# Patient Record
Sex: Female | Born: 1945 | Race: White | Hispanic: No | Smoking: Current every day smoker
Health system: Southern US, Community
[De-identification: ages and names within clinical notes are randomized; demographics above are authoritative.]

## PROBLEM LIST (undated history)

## (undated) DIAGNOSIS — Z8679 Personal history of other diseases of the circulatory system: Secondary | ICD-10-CM

## (undated) DIAGNOSIS — I10 Essential (primary) hypertension: Secondary | ICD-10-CM

## (undated) DIAGNOSIS — I509 Heart failure, unspecified: Secondary | ICD-10-CM

## (undated) DIAGNOSIS — C801 Malignant (primary) neoplasm, unspecified: Secondary | ICD-10-CM

## (undated) DIAGNOSIS — I739 Peripheral vascular disease, unspecified: Secondary | ICD-10-CM

## (undated) DIAGNOSIS — Z22322 Carrier or suspected carrier of Methicillin resistant Staphylococcus aureus: Secondary | ICD-10-CM

## (undated) DIAGNOSIS — G8929 Other chronic pain: Secondary | ICD-10-CM

## (undated) DIAGNOSIS — I252 Old myocardial infarction: Secondary | ICD-10-CM

## (undated) DIAGNOSIS — E785 Hyperlipidemia, unspecified: Secondary | ICD-10-CM

## (undated) DIAGNOSIS — J189 Pneumonia, unspecified organism: Secondary | ICD-10-CM

## (undated) DIAGNOSIS — J45909 Unspecified asthma, uncomplicated: Secondary | ICD-10-CM

## (undated) DIAGNOSIS — I255 Ischemic cardiomyopathy: Secondary | ICD-10-CM

## (undated) DIAGNOSIS — M549 Dorsalgia, unspecified: Secondary | ICD-10-CM

## (undated) DIAGNOSIS — I2510 Atherosclerotic heart disease of native coronary artery without angina pectoris: Secondary | ICD-10-CM

## (undated) DIAGNOSIS — J449 Chronic obstructive pulmonary disease, unspecified: Secondary | ICD-10-CM

## (undated) HISTORY — PX: APPENDECTOMY: SHX54

## (undated) HISTORY — PX: INSERT / REPLACE / REMOVE PACEMAKER: SUR710

## (undated) HISTORY — PX: CARDIAC DEFIBRILLATOR PLACEMENT: SHX171

## (undated) HISTORY — DX: Dorsalgia, unspecified: M54.9

## (undated) HISTORY — PX: OTHER SURGICAL HISTORY: SHX169

## (undated) HISTORY — DX: Ischemic cardiomyopathy: I25.5

## (undated) HISTORY — DX: Hyperlipidemia, unspecified: E78.5

## (undated) HISTORY — DX: Peripheral vascular disease, unspecified: I73.9

## (undated) HISTORY — PX: ABDOMINAL SURGERY: SHX537

## (undated) HISTORY — DX: Other chronic pain: G89.29

## (undated) HISTORY — DX: Personal history of other diseases of the circulatory system: Z86.79

---

## 2010-09-28 ENCOUNTER — Ambulatory Visit: Payer: Self-pay | Admitting: Internal Medicine

## 2010-10-19 ENCOUNTER — Inpatient Hospital Stay: Payer: Self-pay | Admitting: Internal Medicine

## 2010-10-19 DIAGNOSIS — R55 Syncope and collapse: Secondary | ICD-10-CM

## 2010-10-19 DIAGNOSIS — I509 Heart failure, unspecified: Secondary | ICD-10-CM

## 2010-10-19 DIAGNOSIS — R7989 Other specified abnormal findings of blood chemistry: Secondary | ICD-10-CM

## 2010-10-19 DIAGNOSIS — I517 Cardiomegaly: Secondary | ICD-10-CM

## 2010-10-20 DIAGNOSIS — I5020 Unspecified systolic (congestive) heart failure: Secondary | ICD-10-CM

## 2010-10-22 DIAGNOSIS — I5032 Chronic diastolic (congestive) heart failure: Secondary | ICD-10-CM

## 2010-10-29 ENCOUNTER — Ambulatory Visit: Payer: Self-pay | Admitting: Internal Medicine

## 2014-07-21 ENCOUNTER — Inpatient Hospital Stay (HOSPITAL_COMMUNITY)
Admission: AD | Admit: 2014-07-21 | Discharge: 2014-07-28 | DRG: 871 | Disposition: A | Discharge status: Home / Self Care | Payer: Medicare Other | Source: Other Acute Inpatient Hospital | Attending: Pulmonary Disease | Admitting: Pulmonary Disease

## 2014-07-21 ENCOUNTER — Inpatient Hospital Stay (HOSPITAL_COMMUNITY): Payer: Medicare Other

## 2014-07-21 ENCOUNTER — Encounter (HOSPITAL_COMMUNITY): Payer: Self-pay | Admitting: *Deleted

## 2014-07-21 ENCOUNTER — Emergency Department: Payer: Self-pay | Admitting: Emergency Medicine

## 2014-07-21 DIAGNOSIS — F17210 Nicotine dependence, cigarettes, uncomplicated: Secondary | ICD-10-CM | POA: Diagnosis present

## 2014-07-21 DIAGNOSIS — I5021 Acute systolic (congestive) heart failure: Secondary | ICD-10-CM | POA: Diagnosis present

## 2014-07-21 DIAGNOSIS — Z681 Body mass index (BMI) 19 or less, adult: Secondary | ICD-10-CM

## 2014-07-21 DIAGNOSIS — R6521 Severe sepsis with septic shock: Secondary | ICD-10-CM | POA: Diagnosis present

## 2014-07-21 DIAGNOSIS — L97929 Non-pressure chronic ulcer of unspecified part of left lower leg with unspecified severity: Secondary | ICD-10-CM | POA: Diagnosis present

## 2014-07-21 DIAGNOSIS — E43 Unspecified severe protein-calorie malnutrition: Secondary | ICD-10-CM | POA: Insufficient documentation

## 2014-07-21 DIAGNOSIS — R579 Shock, unspecified: Secondary | ICD-10-CM

## 2014-07-21 DIAGNOSIS — E876 Hypokalemia: Secondary | ICD-10-CM | POA: Diagnosis present

## 2014-07-21 DIAGNOSIS — J9621 Acute and chronic respiratory failure with hypoxia: Secondary | ICD-10-CM

## 2014-07-21 DIAGNOSIS — J449 Chronic obstructive pulmonary disease, unspecified: Secondary | ICD-10-CM

## 2014-07-21 DIAGNOSIS — N179 Acute kidney failure, unspecified: Secondary | ICD-10-CM | POA: Diagnosis present

## 2014-07-21 DIAGNOSIS — F17200 Nicotine dependence, unspecified, uncomplicated: Secondary | ICD-10-CM

## 2014-07-21 DIAGNOSIS — I2510 Atherosclerotic heart disease of native coronary artery without angina pectoris: Secondary | ICD-10-CM

## 2014-07-21 DIAGNOSIS — R0602 Shortness of breath: Secondary | ICD-10-CM

## 2014-07-21 DIAGNOSIS — A419 Sepsis, unspecified organism: Secondary | ICD-10-CM | POA: Insufficient documentation

## 2014-07-21 DIAGNOSIS — L97909 Non-pressure chronic ulcer of unspecified part of unspecified lower leg with unspecified severity: Secondary | ICD-10-CM

## 2014-07-21 DIAGNOSIS — I509 Heart failure, unspecified: Secondary | ICD-10-CM

## 2014-07-21 DIAGNOSIS — G9341 Metabolic encephalopathy: Secondary | ICD-10-CM | POA: Diagnosis present

## 2014-07-21 DIAGNOSIS — R627 Adult failure to thrive: Secondary | ICD-10-CM

## 2014-07-21 DIAGNOSIS — Z452 Encounter for adjustment and management of vascular access device: Secondary | ICD-10-CM

## 2014-07-21 DIAGNOSIS — G8929 Other chronic pain: Secondary | ICD-10-CM | POA: Diagnosis present

## 2014-07-21 DIAGNOSIS — J189 Pneumonia, unspecified organism: Secondary | ICD-10-CM | POA: Diagnosis present

## 2014-07-21 DIAGNOSIS — D649 Anemia, unspecified: Secondary | ICD-10-CM | POA: Diagnosis present

## 2014-07-21 DIAGNOSIS — I10 Essential (primary) hypertension: Secondary | ICD-10-CM | POA: Diagnosis present

## 2014-07-21 DIAGNOSIS — I739 Peripheral vascular disease, unspecified: Secondary | ICD-10-CM

## 2014-07-21 DIAGNOSIS — L989 Disorder of the skin and subcutaneous tissue, unspecified: Secondary | ICD-10-CM

## 2014-07-21 HISTORY — DX: Essential (primary) hypertension: I10

## 2014-07-21 HISTORY — DX: Heart failure, unspecified: I50.9

## 2014-07-21 HISTORY — DX: Chronic obstructive pulmonary disease, unspecified: J44.9

## 2014-07-21 HISTORY — DX: Atherosclerotic heart disease of native coronary artery without angina pectoris: I25.10

## 2014-07-21 LAB — TROPONIN I: Troponin-I: 0.85 ng/mL — ABNORMAL HIGH

## 2014-07-21 LAB — COMPREHENSIVE METABOLIC PANEL
ALBUMIN: 2.7 g/dL — AB (ref 3.4–5.0)
Alkaline Phosphatase: 147 U/L — ABNORMAL HIGH
Anion Gap: 12 (ref 7–16)
BUN: 22 mg/dL — ABNORMAL HIGH (ref 7–18)
Bilirubin,Total: 0.5 mg/dL (ref 0.2–1.0)
CO2: 26 mmol/L (ref 21–32)
CREATININE: 1.87 mg/dL — AB (ref 0.60–1.30)
Calcium, Total: 8.4 mg/dL — ABNORMAL LOW (ref 8.5–10.1)
Chloride: 96 mmol/L — ABNORMAL LOW (ref 98–107)
EGFR (African American): 35 — ABNORMAL LOW
EGFR (Non-African Amer.): 29 — ABNORMAL LOW
Glucose: 123 mg/dL — ABNORMAL HIGH (ref 65–99)
Osmolality: 273 (ref 275–301)
Potassium: 4.6 mmol/L (ref 3.5–5.1)
SGOT(AST): 40 U/L — ABNORMAL HIGH (ref 15–37)
SGPT (ALT): 16 U/L
SODIUM: 134 mmol/L — AB (ref 136–145)
TOTAL PROTEIN: 6.9 g/dL (ref 6.4–8.2)

## 2014-07-21 LAB — URINE MICROSCOPIC-ADD ON

## 2014-07-21 LAB — URINALYSIS, ROUTINE W REFLEX MICROSCOPIC
GLUCOSE, UA: NEGATIVE mg/dL
Ketones, ur: 15 mg/dL — AB
Leukocytes, UA: NEGATIVE
Nitrite: NEGATIVE
PH: 5.5 (ref 5.0–8.0)
Protein, ur: 100 mg/dL — AB
SPECIFIC GRAVITY, URINE: 1.029 (ref 1.005–1.030)
Urobilinogen, UA: 1 mg/dL (ref 0.0–1.0)

## 2014-07-21 LAB — CK-MB: CK-MB: 2.4 ng/mL (ref 0.5–3.6)

## 2014-07-21 LAB — LIPASE, BLOOD: LIPASE: 121 U/L (ref 73–393)

## 2014-07-21 LAB — URINALYSIS, COMPLETE
Bacteria: NONE SEEN
Bilirubin,UR: NEGATIVE
Blood: NEGATIVE
GLUCOSE, UR: NEGATIVE mg/dL (ref 0–75)
KETONE: NEGATIVE
LEUKOCYTE ESTERASE: NEGATIVE
Nitrite: NEGATIVE
PH: 5 (ref 4.5–8.0)
Protein: 30
RBC,UR: 1 /HPF (ref 0–5)
Specific Gravity: 1.02 (ref 1.003–1.030)
Squamous Epithelial: <1
WBC UR: 2 /HPF (ref 0–5)

## 2014-07-21 LAB — CBC
HCT: 40.3 % (ref 35.0–47.0)
HGB: 12 g/dL (ref 12.0–16.0)
MCH: 22.5 pg — ABNORMAL LOW (ref 26.0–34.0)
MCHC: 29.8 g/dL — AB (ref 32.0–36.0)
MCV: 76 fL — ABNORMAL LOW (ref 80–100)
Platelet: 276 10*3/uL (ref 150–440)
RBC: 5.33 10*6/uL — ABNORMAL HIGH (ref 3.80–5.20)
RDW: 18.5 % — ABNORMAL HIGH (ref 11.5–14.5)
WBC: 21 10*3/uL — ABNORMAL HIGH (ref 3.6–11.0)

## 2014-07-21 LAB — PRO B NATRIURETIC PEPTIDE: B-TYPE NATIURETIC PEPTID: 16026 pg/mL — AB (ref 0–125)

## 2014-07-21 LAB — LACTIC ACID, PLASMA: LACTIC ACID, VENOUS: 1.5 mmol/L (ref 0.5–2.2)

## 2014-07-21 LAB — MRSA PCR SCREENING: MRSA by PCR: POSITIVE — AB

## 2014-07-21 LAB — MAGNESIUM: Magnesium: 1.9 mg/dL

## 2014-07-21 LAB — PROTIME-INR
INR: 1.4
Prothrombin Time: 17.3 secs — ABNORMAL HIGH (ref 11.5–14.7)

## 2014-07-21 LAB — PROCALCITONIN: Procalcitonin: 8.48 ng/mL

## 2014-07-21 LAB — GLUCOSE, CAPILLARY: Glucose-Capillary: 148 mg/dL — ABNORMAL HIGH (ref 70–99)

## 2014-07-21 MED ORDER — CHLORHEXIDINE GLUCONATE 0.12 % MT SOLN
15.0000 mL | Freq: Two times a day (BID) | OROMUCOSAL | Status: DC
  Administered 2014-07-21 – 2014-07-22 (×2): 15 mL via OROMUCOSAL
  Filled 2014-07-21 (×2): qty 15

## 2014-07-21 MED ORDER — HEPARIN SODIUM (PORCINE) 5000 UNIT/ML IJ SOLN
5000.0000 [IU] | Freq: Three times a day (TID) | INTRAMUSCULAR | Status: DC
  Administered 2014-07-21 – 2014-07-28 (×20): 5000 [IU] via SUBCUTANEOUS
  Filled 2014-07-21 (×24): qty 1

## 2014-07-21 MED ORDER — IPRATROPIUM-ALBUTEROL 0.5-2.5 (3) MG/3ML IN SOLN
3.0000 mL | Freq: Four times a day (QID) | RESPIRATORY_TRACT | Status: DC
  Administered 2014-07-22 – 2014-07-23 (×6): 3 mL via RESPIRATORY_TRACT
  Filled 2014-07-21 (×6): qty 3

## 2014-07-21 MED ORDER — SODIUM CHLORIDE 0.9 % IV BOLUS (SEPSIS): 500.0000 mL | INTRAVENOUS | Status: DC | PRN

## 2014-07-21 MED ORDER — NOREPINEPHRINE BITARTRATE 1 MG/ML IV SOLN
2.0000 ug/min | INTRAVENOUS | Status: DC
  Administered 2014-07-21: 6 ug/min via INTRAVENOUS
  Filled 2014-07-21: qty 4

## 2014-07-21 MED ORDER — CETYLPYRIDINIUM CHLORIDE 0.05 % MT LIQD
7.0000 mL | Freq: Four times a day (QID) | OROMUCOSAL | Status: DC
  Administered 2014-07-22 (×3): 7 mL via OROMUCOSAL

## 2014-07-21 MED ORDER — LEVOFLOXACIN IN D5W 750 MG/150ML IV SOLN
750.0000 mg | Freq: Once | INTRAVENOUS | Status: AC
Start: 2014-07-21 — End: 2014-07-22
  Administered 2014-07-21: 750 mg via INTRAVENOUS
  Filled 2014-07-21: qty 150

## 2014-07-21 MED ORDER — SODIUM CHLORIDE 0.9 % IV SOLN
25.0000 ug/h | INTRAVENOUS | Status: DC
  Administered 2014-07-21: 75 ug/h via INTRAVENOUS
  Administered 2014-07-21: 50 ug/h via INTRAVENOUS
  Filled 2014-07-21: qty 50

## 2014-07-21 MED ORDER — SODIUM CHLORIDE 0.9 % IV SOLN
INTRAVENOUS | Status: DC
  Administered 2014-07-21: 21:00:00 via INTRAVENOUS

## 2014-07-21 MED ORDER — VANCOMYCIN HCL 500 MG IV SOLR
500.0000 mg | INTRAVENOUS | Status: DC
  Administered 2014-07-22 – 2014-07-25 (×4): 500 mg via INTRAVENOUS
  Filled 2014-07-21 (×6): qty 500

## 2014-07-21 MED ORDER — FAMOTIDINE IN NACL 20-0.9 MG/50ML-% IV SOLN
20.0000 mg | Freq: Two times a day (BID) | INTRAVENOUS | Status: DC
  Administered 2014-07-21 – 2014-07-23 (×4): 20 mg via INTRAVENOUS
  Filled 2014-07-21 (×5): qty 50

## 2014-07-21 MED ORDER — NOREPINEPHRINE BITARTRATE 1 MG/ML IV SOLN
2.0000 ug/min | INTRAVENOUS | Status: DC
  Administered 2014-07-21: 6 ug/min via INTRAVENOUS
  Filled 2014-07-21: qty 16

## 2014-07-21 MED ORDER — BUDESONIDE 0.25 MG/2ML IN SUSP
0.2500 mg | Freq: Four times a day (QID) | RESPIRATORY_TRACT | Status: DC
  Administered 2014-07-21 – 2014-07-22 (×2): 0.25 mg via RESPIRATORY_TRACT
  Filled 2014-07-21 (×6): qty 2

## 2014-07-21 MED ORDER — FENTANYL BOLUS VIA INFUSION
25.0000 ug | INTRAVENOUS | Status: DC | PRN
  Administered 2014-07-22: 25 ug via INTRAVENOUS
  Filled 2014-07-21: qty 25

## 2014-07-21 MED ORDER — LEVOFLOXACIN IN D5W 500 MG/100ML IV SOLN
500.0000 mg | INTRAVENOUS | Status: DC
  Administered 2014-07-23: 500 mg via INTRAVENOUS
  Filled 2014-07-21 (×3): qty 100

## 2014-07-21 MED ORDER — PANTOPRAZOLE SODIUM 40 MG IV SOLR: 40.0000 mg | INTRAVENOUS | Status: DC

## 2014-07-21 MED ORDER — IPRATROPIUM-ALBUTEROL 0.5-2.5 (3) MG/3ML IN SOLN
3.0000 mL | RESPIRATORY_TRACT | Status: DC
  Administered 2014-07-21: 3 mL via RESPIRATORY_TRACT
  Filled 2014-07-21: qty 3

## 2014-07-21 MED ORDER — LEVOFLOXACIN IN D5W 500 MG/100ML IV SOLN: 500.0000 mg | INTRAVENOUS | Status: DC

## 2014-07-21 NOTE — H&P (Signed)
PULMONARY / CRITICAL CARE MEDICINE   Name: Lynn Butler MRN: [id number] DOB: 05-26-1946    ADMISSION DATE:  07/21/2014   INITIAL PRESENTATION:  Transferred to ICU/PCCM service from [facility] after intubation in their ED with dx of acute on chronic resp failure, LLL  PNA, septic shock  STUDIES/SIGNIFICANT EVENTS: 12/23 Transferred to ICU/PCCM service from [facility] after intubation in their ED with dx of acute on chronic resp failure, LLL  PNA, septic shock.    INDWELLING DEVICES:: ETT 12/23 >>  L IJ CVL 12/23 >>>   MICRO DATA: PCT 12/23:    , 12/24:    , 12/25:  Strep Ag 12/23 >>  Legionella Ag 12/23 >>  Resp 12/23 >>  Blood ([facility]) 12/23 >>    ANTIMICROBIALS:    HISTORY OF PRESENT ILLNESS:   History obtained from granddaughter. Chronically ill woman admitted in transfer from [facility] ED after intubation for acute on chronic respiratory failure and CXR reportedly demonstrating LLL AS dz. She was also hypotensive despite 4 liters NS and was transported on NE infusion. Her granddaughter reports that she has had increasing malaise, fatigue, cough and dyspnea X several days. Fever was documented in teh ED @ [facility]. Presently pt is minimally responsive and unable to answer questions  PAST MEDICAL HISTORY : : Chronic respiratory failure with hypoxemia COPD mixed type CHF (congestive heart failure) CAD (coronary artery disease) PVD (peripheral vascular disease) Claudication of both lower extremities Nonhealing LLE ulcer Smoker Skin lesion of face Adult failure to thrive   Home meds reviewed from [facility] records  ALLERGIES: NSAIDS   FAMILY HISTORY:  N/C  SOCIAL HISTORY: Semi-independent but minimally ambulatory due to claudication and dyspnea Lives at home with son and granddaughter Still smokes  REVIEW OF SYSTEMS:   N/A from pt.   SUBJECTIVE:   VITAL SIGNS: Temp:  [99.9 F (37.7 C)] 99.9 F (37.7 C) (12/23 1945) Pulse Rate:  [82] 82 (12/23 2000) Resp:  [21] 21 (12/23  2000) BP: (90-114)/(46-68) 90/46 mmHg (12/23 2000) SpO2:  [97 %-100 %] 97 % (12/23 2055) FiO2 (%):  [50 %-100 %] 50 % (12/23 2055) HEMODYNAMICS:   VENTILATOR SETTINGS: Vent Mode:  [-] PRVC FiO2 (%):  [50 %-100 %] 50 % Set Rate:  [16 bmp] 16 bmp Vt Set:  [400 mL] 400 mL PEEP:  [5 cmH20] 5 cmH20 Plateau Pressure:  [14 cmH20] 14 cmH20 INTAKE / OUTPUT: No intake or output data in the 24 hours ending 07/21/14 2113  PHYSICAL EXAMINATION: General:  Intubated, sedated, chronically ill appearing Neuro: PERRL, EOMI, MAEs, DTRs smmetric HEENT: Large verrucous lesion on forehead, NCAT Cardiovascular: distant HS, reg, no M noted Lungs: diminished breath sounds without wheezes Abdomen: Soft, NT, +BS Ext: pedal pulses not palpable, B feet cool and pale, no edema, clean ischemic ulcer on anterior surface of L leg  LABS: I have reviewed all of today's lab results from [facility]. Relevant abnormalities are discussed in the A/P section  CXR: pending  ASSESSMENT / PLAN:  PULMONARY A: Acute on chronic hypoxic respiratory failure COPD without wheezing Suspected CAP P:   Cont full vent support - settings reviewed and/or adjusted Cont vent bundle Daily SBT if/when meets criteria Scheduled and PRN BDs Scheduled nebulized steroids  CARDIOVASCULAR A:  Shock, presumed septic R/O adrenal insuff P:  CVP goal 10-14 MAP goal 65 mmHg NS boluses as needed NE gtt Check serum cortisol   RENAL A:   AKI due to septic shock P:   Monitor BMET intermittently Monitor I/Os  Correct electrolytes as indicated  GASTROINTESTINAL A:   No issues P:   SUP: famotidine Consider TFs 12/24  HEMATOLOGIC A:   No issues P:  DVT px: SQ heparin Monitor CBC intermittently Transfuse per usual ICU guidelines  INFECTIOUS A:   Severe sepsis LLL CAP LLE ulcer of vascular insufficiency P:   micro and abx as above  ENDOCRINE A:  No issues P:   Monitor glu on chem panels Consider SSI for glu >  180  NEUROLOGIC A:   Acute encephalopathy ICU associated discomfort P:   RASS goal: -1, -2 PAD protocol   FAMILY  - Updates: son and granddaughter updated @ bedside  45 mins CCM time     Merton Border, MD ; [facility] service Mobile [phone].  After 5:30 PM or weekends, call [phone] Pulmonary and [place] Pager: [phone]  07/21/2014, 9:13 PM

## 2014-07-21 NOTE — Progress Notes (Signed)
ANTIBIOTIC CONSULT NOTE - INITIAL  Pharmacy Consult for vancomycin  Indication: Sepsis/pneumonia  Allergies  Allergen Reactions  . Nsaids Other (See Comments)    GI distress    Patient Measurements: Height: 5' (152.4 cm) Weight: 85 lb (38.556 kg) (per [facility] chart) IBW/kg (Calculated) : 45.5   Vital Signs: Temp: 99.9 F (37.7 C) (12/23 1945) Temp Source: Rectal (12/23 1945) BP: 90/46 mmHg (12/23 2000) Pulse Rate: 82 (12/23 2000) Intake/Output from previous day:   Intake/Output from this shift:    Labs: No results for input(s): WBC, HGB, PLT, LABCREA, CREATININE in the last 72 hours. CrCl cannot be calculated (Patient has no serum creatinine result on file.). No results for input(s): VANCOTROUGH, VANCOPEAK, VANCORANDOM, GENTTROUGH, GENTPEAK, GENTRANDOM, TOBRATROUGH, TOBRAPEAK, TOBRARND, AMIKACINPEAK, AMIKACINTROU, AMIKACIN in the last 72 hours.   Microbiology: No results found for this or any previous visit (from the past 720 hour(s)).  Medical History: No past medical history on file.  Medications:  Home med list pending  Assessment: 68 year old female transferred from [place] to [facility] for septic shock. Patient received vancomycin and zosyn at [facility] ([id number]).  -Elevated wbc at 21 -Elevated scr at 1.8 -Fever 103.3  Goal of Therapy:  Vancomycin trough level 15-20 mcg/ml  Plan:  Measure antibiotic drug levels at steady state Follow up culture results and renal function  Vancomycin 1g given at [facility]>>continue with 500mg  IV q24 hours Levaquin 750 x1 tonight then 500mg  q48 hours  Erin Hearing PharmD., BCPS Clinical Pharmacist Pager [phone] 07/21/2014 9:29 PM

## 2014-07-21 NOTE — Procedures (Signed)
Central Venous Catheter Insertion Procedure Note Lynn Butler [id number] 1946/03/21  Procedure: Insertion of Central Venous Catheter Indications: Assessment of intravascular volume, Drug and/or fluid administration and Frequent blood sampling  Procedure Details Consent: Risks of procedure as well as the alternatives and risks of each were explained to the (patient/caregiver).  Consent for procedure obtained. Time Out: Verified patient identification, verified procedure, site/side was marked, verified correct patient position, special equipment/implants available, medications/allergies/relevent history reviewed, required imaging and test results available.  Performed  Maximum sterile technique was used including antiseptics, cap, gloves, gown, hand hygiene, mask and sheet. Skin prep: Chlorhexidine; local anesthetic administered A antimicrobial bonded/coated triple lumen catheter was placed in the left internal jugular vein using the Seldinger technique.  Evaluation Blood flow good Complications: No apparent complications Patient did tolerate procedure well. Chest X-ray ordered to verify placement.  CXR: pending.  Procedure performed under direct ultrasound guidance for real time vessel cannulation.      [name], [name] [facility] Pgr: [phone]  or [phone] 07/21/2014, 9:29 PM   I was present for and supervised the entire procedure  Merton Border, MD ; [facility] service Mobile [phone].  After 5:30 PM or weekends, call [phone]

## 2014-07-21 NOTE — Progress Notes (Signed)
Wasted 200 mL of Fentanyl with Darnelle Catalan. Wasted in sink

## 2014-07-22 ENCOUNTER — Inpatient Hospital Stay (HOSPITAL_COMMUNITY): Payer: Medicare Other

## 2014-07-22 DIAGNOSIS — R6521 Severe sepsis with septic shock: Secondary | ICD-10-CM

## 2014-07-22 DIAGNOSIS — A419 Sepsis, unspecified organism: Principal | ICD-10-CM

## 2014-07-22 DIAGNOSIS — J9621 Acute and chronic respiratory failure with hypoxia: Secondary | ICD-10-CM

## 2014-07-22 DIAGNOSIS — E43 Unspecified severe protein-calorie malnutrition: Secondary | ICD-10-CM | POA: Insufficient documentation

## 2014-07-22 LAB — BASIC METABOLIC PANEL
Anion gap: 7 (ref 5–15)
BUN: 20 mg/dL (ref 6–23)
CO2: 23 mmol/L (ref 19–32)
Calcium: 7.2 mg/dL — ABNORMAL LOW (ref 8.4–10.5)
Chloride: 104 mEq/L (ref 96–112)
Creatinine, Ser: 1.31 mg/dL — ABNORMAL HIGH (ref 0.50–1.10)
GFR calc Af Amer: 48 mL/min — ABNORMAL LOW (ref >90)
GFR, EST NON AFRICAN AMERICAN: 41 mL/min — AB (ref >90)
Glucose, Bld: 120 mg/dL — ABNORMAL HIGH (ref 70–99)
POTASSIUM: 3.4 mmol/L — AB (ref 3.5–5.1)
Sodium: 134 mmol/L — ABNORMAL LOW (ref 135–145)

## 2014-07-22 LAB — CBC
HEMATOCRIT: 33.8 % — AB (ref 36.0–46.0)
Hemoglobin: 10.1 g/dL — ABNORMAL LOW (ref 12.0–15.0)
MCH: 22.5 pg — ABNORMAL LOW (ref 26.0–34.0)
MCHC: 29.9 g/dL — ABNORMAL LOW (ref 30.0–36.0)
MCV: 75.3 fL — ABNORMAL LOW (ref 78.0–100.0)
Platelets: 215 10*3/uL (ref 150–400)
RBC: 4.49 MIL/uL (ref 3.87–5.11)
RDW: 17.3 % — ABNORMAL HIGH (ref 11.5–15.5)
WBC: 15.3 10*3/uL — ABNORMAL HIGH (ref 4.0–10.5)

## 2014-07-22 LAB — LEGIONELLA ANTIGEN, URINE

## 2014-07-22 LAB — PROCALCITONIN: PROCALCITONIN: 8.12 ng/mL

## 2014-07-22 LAB — POCT I-STAT 3, ART BLOOD GAS (G3+)
Acid-base deficit: 4 mmol/L — ABNORMAL HIGH (ref 0.0–2.0)
Bicarbonate: 22.5 mEq/L (ref 20.0–24.0)
O2 Saturation: 97 %
Patient temperature: 98.3
TCO2: 24 mmol/L (ref 0–100)
pCO2 arterial: 45.3 mmHg — ABNORMAL HIGH (ref 35.0–45.0)
pH, Arterial: 7.304 — ABNORMAL LOW (ref 7.350–7.450)
pO2, Arterial: 96 mmHg (ref 80.0–100.0)

## 2014-07-22 LAB — PHOSPHORUS: PHOSPHORUS: 3.1 mg/dL (ref 2.3–4.6)

## 2014-07-22 LAB — STREP PNEUMONIAE URINARY ANTIGEN: STREP PNEUMO URINARY ANTIGEN: NEGATIVE

## 2014-07-22 LAB — MAGNESIUM: Magnesium: 1.6 mg/dL (ref 1.5–2.5)

## 2014-07-22 LAB — CORTISOL: Cortisol, Plasma: 26.9 ug/dL

## 2014-07-22 LAB — INFLUENZA PANEL BY PCR (TYPE A & B)
H1N1FLUPCR: NOT DETECTED
Influenza A By PCR: NEGATIVE
Influenza B By PCR: NEGATIVE

## 2014-07-22 MED ORDER — CETYLPYRIDINIUM CHLORIDE 0.05 % MT LIQD
7.0000 mL | Freq: Two times a day (BID) | OROMUCOSAL | Status: DC
  Administered 2014-07-22: 7 mL via OROMUCOSAL

## 2014-07-22 MED ORDER — OXYCODONE HCL ER 80 MG PO T12A: 160.0000 mg | EXTENDED_RELEASE_TABLET | Freq: Two times a day (BID) | ORAL | Status: DC

## 2014-07-22 MED ORDER — HYDROMORPHONE HCL 2 MG PO TABS
4.0000 mg | ORAL_TABLET | ORAL | Status: DC | PRN
  Administered 2014-07-22 – 2014-07-24 (×4): 4 mg via ORAL
  Filled 2014-07-22 (×6): qty 2

## 2014-07-22 MED ORDER — CHLORHEXIDINE GLUCONATE CLOTH 2 % EX PADS
6.0000 | MEDICATED_PAD | Freq: Every day | CUTANEOUS | Status: AC
  Administered 2014-07-22 – 2014-07-26 (×5): 6 via TOPICAL

## 2014-07-22 MED ORDER — OXYCODONE HCL ER 40 MG PO T12A
160.0000 mg | EXTENDED_RELEASE_TABLET | Freq: Two times a day (BID) | ORAL | Status: DC
  Administered 2014-07-22 – 2014-07-28 (×12): 160 mg via ORAL
  Filled 2014-07-22 (×13): qty 4

## 2014-07-22 MED ORDER — MAGNESIUM SULFATE 2 GM/50ML IV SOLN
2.0000 g | Freq: Once | INTRAVENOUS | Status: AC
  Administered 2014-07-22: 2 g via INTRAVENOUS
  Filled 2014-07-22: qty 50

## 2014-07-22 MED ORDER — DIAZEPAM 5 MG PO TABS: 10.0000 mg | ORAL_TABLET | Freq: Four times a day (QID) | ORAL | Status: DC

## 2014-07-22 MED ORDER — ZOLPIDEM TARTRATE 5 MG PO TABS: 10.0000 mg | ORAL_TABLET | Freq: Every day | ORAL | Status: DC

## 2014-07-22 MED ORDER — NICOTINE 21 MG/24HR TD PT24
21.0000 mg | MEDICATED_PATCH | TRANSDERMAL | Status: DC
  Administered 2014-07-22 – 2014-07-27 (×6): 21 mg via TRANSDERMAL
  Filled 2014-07-22 (×7): qty 1

## 2014-07-22 MED ORDER — HYDROMORPHONE HCL 2 MG PO TABS: 8.0000 mg | ORAL_TABLET | ORAL | Status: DC

## 2014-07-22 MED ORDER — POTASSIUM CHLORIDE 20 MEQ/15ML (10%) PO SOLN
20.0000 meq | ORAL | Status: AC
  Administered 2014-07-22 (×2): 20 meq
  Filled 2014-07-22 (×2): qty 15

## 2014-07-22 MED ORDER — ZOLPIDEM TARTRATE 5 MG PO TABS
5.0000 mg | ORAL_TABLET | Freq: Every evening | ORAL | Status: DC | PRN
  Administered 2014-07-22 – 2014-07-27 (×3): 5 mg via ORAL
  Filled 2014-07-22 (×3): qty 1

## 2014-07-22 MED ORDER — MUPIROCIN 2 % EX OINT
1.0000 "application " | TOPICAL_OINTMENT | Freq: Two times a day (BID) | CUTANEOUS | Status: AC
  Administered 2014-07-22 – 2014-07-26 (×10): 1 via NASAL
  Filled 2014-07-22 (×2): qty 22

## 2014-07-22 MED ORDER — BUDESONIDE 0.5 MG/2ML IN SUSP
0.5000 mg | Freq: Two times a day (BID) | RESPIRATORY_TRACT | Status: DC
  Administered 2014-07-22 – 2014-07-28 (×10): 0.5 mg via RESPIRATORY_TRACT
  Filled 2014-07-22 (×16): qty 2

## 2014-07-22 MED ORDER — ENSURE COMPLETE PO LIQD
237.0000 mL | Freq: Three times a day (TID) | ORAL | Status: DC
  Administered 2014-07-22 – 2014-07-28 (×17): 237 mL via ORAL

## 2014-07-22 MED ORDER — DIAZEPAM 5 MG PO TABS
10.0000 mg | ORAL_TABLET | Freq: Four times a day (QID) | ORAL | Status: DC | PRN
  Administered 2014-07-22 – 2014-07-28 (×6): 10 mg via ORAL
  Filled 2014-07-22 (×6): qty 2

## 2014-07-22 NOTE — Progress Notes (Signed)
[facility] ADULT ICU REPLACEMENT PROTOCOL FOR AM LAB REPLACEMENT ONLY  The patient does apply for the [facility] Adult ICU Electrolyte Replacment Protocol based on the criteria listed below:   1. Is GFR >/= 40 ml/min? Yes.    Patient's GFR today is 41 2. Is urine output >/= 0.5 ml/kg/hr for the last 6 hours? Yes.   Patient's UOP is 1.04 ml/kg/hr 3. Is BUN < 60 mg/dL? Yes.    Patient's BUN today is 20 4. Abnormal electrolyte(s): K=3.4, Mg=1.6 5. Ordered repletion with: [facility] adult ICU replacement protocol 6. If a panic level lab has been reported, has the CCM MD in charge been notified? Yes.  .   Physician:  Dr. Kara Mead  [facility], Darrick Huntsman E 07/22/2014 5:09 AM

## 2014-07-22 NOTE — Progress Notes (Signed)
30 mL Versed wasted in sink. Witness Allegra Grana RN

## 2014-07-22 NOTE — Progress Notes (Signed)
INITIAL NUTRITION ASSESSMENT  DOCUMENTATION CODES Per approved criteria  -Severe malnutrition in the context of chronic illness -Underweight   Pt meets criteria for severe MALNUTRITION in the context of chronic illness as evidenced by severe depletion of muscle and subcutaneous fat mass.  INTERVENTION:  Ensure Complete PO TID, each supplement provides 350 kcal and 13 grams of protein  NUTRITION DIAGNOSIS: Malnutrition related to inadequate oral intake as evidenced by severe depletion of muscle and subcutaneous fat mass.   Goal: Intake to meet >90% of estimated nutrition needs.  Monitor:  PO intake, labs, weight trend.  Reason for Assessment: MST  68 y.o. female  Admitting Dx: Acute on chronic respiratory failure with hypoxemia  ASSESSMENT: Transferred to MICU from [facility] after intubation in their ED with dx of acute on chronic resp failure, LLL PNA, septic shock.  Patient was extubated this AM. She is underweight with BMI=16.6. Patient reports that she was eating very little PTA. She agreed to drink Ensure supplements between meals.  Nutrition Focused Physical Exam:  Subcutaneous Fat:  Orbital Region: severe depletion Upper Arm Region: severe depletion Thoracic and Lumbar Region: NA  Muscle:  Temple Region: severe depletion Clavicle Bone Region: severe depletion Clavicle and Acromion Bone Region: severe depletion Scapular Bone Region: NA Dorsal Hand: moderate depletion Patellar Region: moderate depletion Anterior Thigh Region: moderate depletion Posterior Calf Region: moderate depletion  Edema: none   Height: Ht Readings from Last 1 Encounters:  07/21/14 5' (1.524 m)    Weight: Wt Readings from Last 1 Encounters:  07/21/14 85 lb (38.556 kg)    Ideal Body Weight: 45.5 kg  % Ideal Body Weight: 85%  Wt Readings from Last 10 Encounters:  07/21/14 85 lb (38.556 kg)    Usual Body Weight: 189 lbs (in 2005 per patient)  % Usual Body Weight: 45%  BMI:   Body mass index is 16.6 kg/(m^2). Underweight  Estimated Nutritional Needs: Kcal: 1250-1450 Protein: 65-75 gm Fluid: 1.5 L  Skin: stage 1 pressure ulcer to sacrum; left leg arterial ulcer  Diet Order: Diet regular  EDUCATION NEEDS: -Education needs addressed   Intake/Output Summary (Last 24 hours) at 07/22/14 0900 Last data filed at 07/22/14 0829  Gross per 24 hour  Intake 1187.13 ml  Output    455 ml  Net 732.13 ml    Last BM: PTA   Labs:   Recent Labs Lab 07/22/14 0330  NA 134*  K 3.4*  CL 104  CO2 23  BUN 20  CREATININE 1.31*  CALCIUM 7.2*  MG 1.6  PHOS 3.1  GLUCOSE 120*    CBG (last 3)   Recent Labs  07/21/14 1944  GLUCAP 148*    Scheduled Meds: . antiseptic oral rinse  7 mL Mouth Rinse QID  . budesonide (PULMICORT) nebulizer solution  0.5 mg Nebulization BID  . chlorhexidine  15 mL Mouth Rinse BID  . Chlorhexidine Gluconate Cloth  6 each Topical Q0600  . famotidine (PEPCID) IV  20 mg Intravenous Q12H  . heparin subcutaneous  5,000 Units Subcutaneous 3 times per day  . ipratropium-albuterol  3 mL Nebulization Q6H  . [START ON 07/23/2014] levofloxacin (LEVAQUIN) IV  500 mg Intravenous Q48H  . mupirocin ointment  1 application Nasal BID  . vancomycin  500 mg Intravenous Q24H    Continuous Infusions: . sodium chloride 75 mL/hr at 07/21/14 2116  . norepinephrine (LEVOPHED) Adult infusion 6 mcg/min (07/21/14 2314)    Past Medical History  Diagnosis Date  . COPD (chronic obstructive pulmonary  disease)   . Hypertension   . CHF (congestive heart failure)   . Coronary artery disease     Past Surgical History  Procedure Laterality Date  . Insert / replace / remove pacemaker      Molli Barrows, [place], [place], [place] Pager [phone] After Hours Pager [phone]

## 2014-07-22 NOTE — Procedures (Signed)
Extubation Procedure Note  Patient Details:   Name: Rosalee Tolley DOB: 06-07-1946 MRN: [id number]   Airway Documentation:     Evaluation  O2 sats: stable throughout Complications: No apparent complications Patient did tolerate procedure well. Bilateral Breath Sounds: Clear, Diminished   Yes   Order received for extubation.  Cuff leak positive prior to extubation.  Placed on 4l [place].  Patient tolerated well.  Will continue to monitor.    Phillis Knack [facility] 07/22/2014, 8:47 AM

## 2014-07-22 NOTE — Progress Notes (Signed)
Wasted 150 cc of fentanyl in sink with Dario Ave, RN.

## 2014-07-22 NOTE — Consult Note (Addendum)
WOC wound consult note Reason for Consult: Consult requested for left leg wound.  Pt appears to be well-informed regarding topical treatment.  She states she previously had a wound "which went down to the bone, and they applied a skin graft at [facility]." Wound type: Chronic full thickness to left anterior ankle/calf area Measurement: Several areas of wounds which are separated by narrow islands of skin; entire affected area is 12X8X.1cm Wound bed: 90% red, 10% yellow  Drainage (amount, consistency, odor) Small amt yellow drainage, no odor Periwound: Intact skin surrounding Dressing procedure/placement/frequency: Continue present plan of care as ordered by physician at [facility] (instructions provided by patient's daughter) with calcium alginate Q day covered by ABD pad and kerlex, then ace wrap.  Pt can resume follow-up with that facility after discharge. Please re-consult if further assistance is needed.  Thank-you,  Julien Girt MSN, [place], [place], [place], [place]

## 2014-07-22 NOTE — Progress Notes (Signed)
UR Completed.  [phone]  

## 2014-07-22 NOTE — Progress Notes (Signed)
PULMONARY / CRITICAL CARE MEDICINE   Name: Lynn Butler MRN: [id number] DOB: 03/14/1946    ADMISSION DATE:  07/21/2014   INITIAL PRESENTATION:  Transferred to ICU/PCCM service from [facility] after intubation in their ED with dx of acute on chronic resp failure, LLL  PNA, septic shock  STUDIES/SIGNIFICANT EVENTS: 12/23 Transfer from [facility] 12/24  Pressure support trials  SUBJECTIVE:  Tolerating pressure support  VITAL SIGNS: Temp:  [97.5 F (36.4 C)-99.9 F (37.7 C)] 98.3 F (36.8 C) (12/24 0427) Pulse Rate:  [42-91] 88 (12/24 0737) Resp:  [13-25] 18 (12/24 0737) BP: (75-173)/(40-68) 114/59 mmHg (12/24 0737) SpO2:  [96 %-100 %] 97 % (12/24 0755) FiO2 (%):  [40 %-100 %] 40 % (12/24 0737) Weight:  [85 lb (38.556 kg)] 85 lb (38.556 kg) (12/23 2120) HEMODYNAMICS: CVP:  [5 mmHg-9 mmHg] 7 mmHg VENTILATOR SETTINGS: Vent Mode:  [-] PSV;CPAP FiO2 (%):  [40 %-100 %] 40 % Set Rate:  [16 bmp] 16 bmp Vt Set:  [400 mL] 400 mL PEEP:  [5 cmH20] 5 cmH20 Pressure Support:  [5 cmH20] 5 cmH20 Plateau Pressure:  [14 cmH20-15 cmH20] 14 cmH20 INTAKE / OUTPUT:  Intake/Output Summary (Last 24 hours) at 07/22/14 0801 Last data filed at 07/22/14 0600  Gross per 24 hour  Intake 1007.3 ml  Output    395 ml  Net  612.3 ml    PHYSICAL EXAMINATION: General: chronically ill appearing Neuro: follows commands, moves all extremities HEENT: Large verrucous lesion on forehead, ETT/OG in place Cardiovascular: regular, no murmur Lungs: diminished breath sounds without wheezes Abdomen: Soft, non tender Ext: no edema Skin: chronic ulceration Lt anterior lower leg  LABS: CBC Recent Labs     07/22/14  0330  WBC  15.3*  HGB  10.1*  HCT  33.8*  PLT  215   BMET Recent Labs     07/22/14  0330  NA  134*  K  3.4*  CL  104  CO2  23  BUN  20  CREATININE  1.31*  GLUCOSE  120*    Electrolytes Recent Labs     07/22/14  0330  CALCIUM  7.2*  MG  1.6  PHOS  3.1    Sepsis Markers Recent  Labs     07/21/14  2006  PROCALCITON  8.48    ABG Recent Labs     07/22/14  0531  PHART  7.304*  PCO2ART  45.3*  PO2ART  96.0    Glucose Recent Labs     07/21/14  1944  GLUCAP  148*    Imaging Dg Chest Port 1 View  07/22/2014   CLINICAL DATA:  Hypoxia  EXAM: PORTABLE CHEST - 1 VIEW  COMPARISON:  July 21, 2014  FINDINGS: Endotracheal tube tip is 2.0 cm above the carina. Nasogastric tube tip and side port are in the stomach. Central catheter tip is in the superior vena cava. There is no apparent pneumothorax. There is persistent interstitial edema with cardiomegaly. Pulmonary vascularity is within normal limits. Pacemaker leads remained attached to the right atrium and right ventricle. There is a small left effusion. There is atelectatic change in each lung base.  IMPRESSION: Tube and catheter positions as described without pneumothorax. Findings indicative of a degree of congestive heart failure. Left base atelectasis is present.   Electronically Signed   By: Lowella Grip M.D.   On: 07/22/2014 07:17   Dg Chest Port 1 View  07/21/2014   CLINICAL DATA:  Central line placement.  EXAM: PORTABLE CHEST -  1 VIEW  COMPARISON:  Chest radiograph July 21, 2014 at 1804 hr  FINDINGS: Interval placement of LEFT internal jugular (less likely subclavian line, area obscured by facial structures) central venous catheter are, distal tip projecting in proximal superior vena cava. No definite pneumothorax though, LEFT lung apices obscured by facial structures. Endotracheal tube tip projects 2.6 cm above the carina. Nasogastric tube and side port past the proximal GE junction. The cardiac silhouette appears mildly enlarged. Tortuous calcified aorta.  Pulmonary vascular congestion, LEFT greater than RIGHT lower lobe airspace opacities with small LEFT pleural effusion. Mild interstitial prominence. LEFT cardiac defibrillator in situ. Multiple EKG lines overlie the patient and may obscure subtle  underlying pathology.  IMPRESSION: LEFT central venous catheter tip projects in proximal superior vena cava. No pneumothorax. Stable ETT. Nasogastric tube past GE junction region.  Similar interstitial and bibasilar airspace opacities may reflect pulmonary edema, less likely pneumonia. Small LEFT pleural effusion.  Stable cardiomegaly.   Electronically Signed   By: Elon Alas   On: 07/21/2014 22:11       ASSESSMENT / PLAN:  PULMONARY ETT 12/23 >> A: Acute on chronic hypoxic respiratory failure 2nd to PNA. Hx of COPD. P:   Pressure support wean >> might be ready for extubation soon Scheduled duoneb, pulmicort F/u CXR  CARDIOVASCULAR Lt IJ CVL 12/23 >> A:  Septic shock 2nd to PNA. Hx of HTN, CAD, PAD. P:  Wean off pressors to keep MAP > 65 F/u cortisol  RENAL A:   AKI due to septic shock >> not sure what baseline renal fx is. Hypokalemia, hypomagnesemia. P:   Monitor renal fx, urine outpt F/u and replace electrolytes as needed  GASTROINTESTINAL A:   Protein calorie malnutrition. P:   Pepcid for SUP Tube feeds if unable to extubate soon  HEMATOLOGIC A:   Anemia of critical illness. P:  F/u CBC SQ heparin for DVT prevention  INFECTIOUS A:   Septic shock 2nd to PNA. P:   Day 2 vancomycin, levaquin F/u procalcitonin  Blood cx 12/23 [facility]) >>  Legionella Ag 12/23 >> Influenza PCR 12/23 >> MRSA screen 12/23 >> positive Sputum 12/23 >>  ENDOCRINE A:  No acute issues. P:   Monitor blood sugar on BMET  NEUROLOGIC A:   Acute metabolic encephalopathy. P:   RASS goal 0  DERMATOLOGY A: Verrucous lesion on forehead. Plan: Will need outpt f/u with dermatology  SUMMARY: Respiratory mechanics improved >> possible extubate later today.  Wean off pressors as tolerated.  Continue current Abx pending cx results.  CC time 35 minutes.  Chesley Mires, MD [facility] Pulmonary/Critical Care 07/22/2014, 8:10 AM Pager:  [phone] After 3pm call:  [phone]

## 2014-07-23 ENCOUNTER — Inpatient Hospital Stay (HOSPITAL_COMMUNITY): Payer: Medicare Other

## 2014-07-23 DIAGNOSIS — I2510 Atherosclerotic heart disease of native coronary artery without angina pectoris: Secondary | ICD-10-CM

## 2014-07-23 DIAGNOSIS — E43 Unspecified severe protein-calorie malnutrition: Secondary | ICD-10-CM

## 2014-07-23 LAB — CULTURE, RESPIRATORY W GRAM STAIN

## 2014-07-23 LAB — BASIC METABOLIC PANEL
ANION GAP: 3 — AB (ref 5–15)
BUN: 9 mg/dL (ref 6–23)
CO2: 28 mmol/L (ref 19–32)
Calcium: 7.4 mg/dL — ABNORMAL LOW (ref 8.4–10.5)
Chloride: 105 mEq/L (ref 96–112)
Creatinine, Ser: 0.8 mg/dL (ref 0.50–1.10)
GFR calc non Af Amer: 75 mL/min — ABNORMAL LOW (ref >90)
GFR, EST AFRICAN AMERICAN: 86 mL/min — AB (ref >90)
Glucose, Bld: 93 mg/dL (ref 70–99)
Potassium: 3.9 mmol/L (ref 3.5–5.1)
Sodium: 136 mmol/L (ref 135–145)

## 2014-07-23 LAB — CBC
HCT: 28.8 % — ABNORMAL LOW (ref 36.0–46.0)
HEMOGLOBIN: 8.9 g/dL — AB (ref 12.0–15.0)
MCH: 23.4 pg — ABNORMAL LOW (ref 26.0–34.0)
MCHC: 30.9 g/dL (ref 30.0–36.0)
MCV: 75.6 fL — ABNORMAL LOW (ref 78.0–100.0)
Platelets: 153 10*3/uL (ref 150–400)
RBC: 3.81 MIL/uL — ABNORMAL LOW (ref 3.87–5.11)
RDW: 17.7 % — AB (ref 11.5–15.5)
WBC: 7.3 10*3/uL (ref 4.0–10.5)

## 2014-07-23 LAB — PROCALCITONIN: PROCALCITONIN: 5.21 ng/mL

## 2014-07-23 LAB — CULTURE, RESPIRATORY

## 2014-07-23 LAB — MAGNESIUM: MAGNESIUM: 1.8 mg/dL (ref 1.5–2.5)

## 2014-07-23 MED ORDER — CLOPIDOGREL BISULFATE 75 MG PO TABS
75.0000 mg | ORAL_TABLET | Freq: Every day | ORAL | Status: DC
  Administered 2014-07-23 – 2014-07-28 (×6): 75 mg via ORAL
  Filled 2014-07-23 (×7): qty 1

## 2014-07-23 MED ORDER — IPRATROPIUM-ALBUTEROL 0.5-2.5 (3) MG/3ML IN SOLN: 3.0000 mL | RESPIRATORY_TRACT | Status: DC | PRN

## 2014-07-23 MED ORDER — TIOTROPIUM BROMIDE MONOHYDRATE 18 MCG IN CAPS
18.0000 ug | ORAL_CAPSULE | Freq: Every day | RESPIRATORY_TRACT | Status: DC
  Administered 2014-07-24 – 2014-07-28 (×5): 18 ug via RESPIRATORY_TRACT
  Filled 2014-07-23: qty 5

## 2014-07-23 MED ORDER — SODIUM CHLORIDE 0.9 % IV SOLN
INTRAVENOUS | Status: DC | PRN
  Administered 2014-07-23: 17:00:00 via INTRAVENOUS

## 2014-07-23 MED ORDER — ARFORMOTEROL TARTRATE 15 MCG/2ML IN NEBU
15.0000 ug | INHALATION_SOLUTION | Freq: Two times a day (BID) | RESPIRATORY_TRACT | Status: DC
  Administered 2014-07-24 – 2014-07-28 (×8): 15 ug via RESPIRATORY_TRACT
  Filled 2014-07-23 (×14): qty 2

## 2014-07-23 MED ORDER — FAMOTIDINE 20 MG PO TABS
20.0000 mg | ORAL_TABLET | Freq: Every day | ORAL | Status: DC
  Administered 2014-07-23 – 2014-07-27 (×5): 20 mg via ORAL
  Filled 2014-07-23 (×6): qty 1

## 2014-07-23 NOTE — Progress Notes (Signed)
PULMONARY / CRITICAL CARE MEDICINE   Name: Lynn Butler MRN: [id number] DOB: April 10, 1946    ADMISSION DATE:  07/21/2014   INITIAL PRESENTATION:  Transferred to ICU/PCCM service from [facility] after intubation in their ED with dx of acute on chronic resp failure, LLL  PNA, septic shock  STUDIES/SIGNIFICANT EVENTS: 12/23 Transfer from [facility] 12/24 Extubated 12/25 Transfer to telemetry  SUBJECTIVE:  Denies chest pain.  Cough improved.  VITAL SIGNS: Temp:  [97.6 F (36.4 C)-101.1 F (38.4 C)] 98.5 F (36.9 C) (12/25 0804) Pulse Rate:  [33-93] 87 (12/25 0500) Resp:  [16-33] 29 (12/25 0600) BP: (98-127)/(43-101) 125/60 mmHg (12/25 0900) SpO2:  [94 %-100 %] 98 % (12/25 0918) INTAKE / OUTPUT:  Intake/Output Summary (Last 24 hours) at 07/23/14 0949 Last data filed at 07/23/14 0905  Gross per 24 hour  Intake 1889.97 ml  Output    631 ml  Net 1258.97 ml    PHYSICAL EXAMINATION: General: chronically ill appearing, sitting in chair Neuro: normal strength HEENT: Large verrucous lesion on forehead Cardiovascular: regular, no murmur Lungs: diminished breath sounds without wheezes Abdomen: Soft, non tender Ext: no edema Skin: chronic ulceration Lt anterior lower leg  LABS: CBC Recent Labs     07/22/14  0330  07/23/14  0455  WBC  15.3*  7.3  HGB  10.1*  8.9*  HCT  33.8*  28.8*  PLT  215  153   BMET Recent Labs     07/22/14  0330  07/23/14  0455  NA  134*  136  K  3.4*  3.9  CL  104  105  CO2  23  28  BUN  20  9  CREATININE  1.31*  0.80  GLUCOSE  120*  93    Electrolytes Recent Labs     07/22/14  0330  07/23/14  0455  CALCIUM  7.2*  7.4*  MG  1.6  1.8  PHOS  3.1   --     Sepsis Markers Recent Labs     07/21/14  2006  07/22/14  0330  07/23/14  0455  PROCALCITON  8.48  8.12  5.21    ABG Recent Labs     07/22/14  0531  PHART  7.304*  PCO2ART  45.3*  PO2ART  96.0    Glucose Recent Labs     07/21/14  1944  GLUCAP  148*    Imaging Dg  Chest Port 1 View  07/23/2014   CLINICAL DATA:  Pneumonia.  Shortness of breath.  EXAM: PORTABLE CHEST - 1 VIEW  COMPARISON:  07/22/2014  FINDINGS: Left IJ line tip: SVC. Atherosclerotic aortic arch. AICD remains in place.  Endotracheal tube removed.  Nasogastric tube removed.  Moderate enlargement of the cardiopericardial silhouette indistinct pulmonary vasculature with interstitial accentuation of both lung bases. Confluent retrocardiac airspace opacity in the left lower lobe. Mild blunting of the left lateral costophrenic angle.  IMPRESSION: 1. Mild increase in airspace opacity at the left lung base. Underlying interstitial opacity bilaterally favoring edema. Moderate enlargement of the cardiopericardial silhouette 2. Endotracheal and nasogastric tubes have been removed. 3. Suspected small left pleural effusion.   Electronically Signed   By: Sherryl Barters M.D.   On: 07/23/2014 08:40   Dg Chest Port 1 View  07/22/2014   CLINICAL DATA:  Hypoxia  EXAM: PORTABLE CHEST - 1 VIEW  COMPARISON:  July 21, 2014  FINDINGS: Endotracheal tube tip is 2.0 cm above the carina. Nasogastric tube tip and side port are in the  stomach. Central catheter tip is in the superior vena cava. There is no apparent pneumothorax. There is persistent interstitial edema with cardiomegaly. Pulmonary vascularity is within normal limits. Pacemaker leads remained attached to the right atrium and right ventricle. There is a small left effusion. There is atelectatic change in each lung base.  IMPRESSION: Tube and catheter positions as described without pneumothorax. Findings indicative of a degree of congestive heart failure. Left base atelectasis is present.   Electronically Signed   By: Lowella Grip M.D.   On: 07/22/2014 07:17   Dg Chest Port 1 View  07/21/2014   CLINICAL DATA:  Central line placement.  EXAM: PORTABLE CHEST - 1 VIEW  COMPARISON:  Chest radiograph July 21, 2014 at 1804 hr  FINDINGS: Interval placement of LEFT  internal jugular (less likely subclavian line, area obscured by facial structures) central venous catheter are, distal tip projecting in proximal superior vena cava. No definite pneumothorax though, LEFT lung apices obscured by facial structures. Endotracheal tube tip projects 2.6 cm above the carina. Nasogastric tube and side port past the proximal GE junction. The cardiac silhouette appears mildly enlarged. Tortuous calcified aorta.  Pulmonary vascular congestion, LEFT greater than RIGHT lower lobe airspace opacities with small LEFT pleural effusion. Mild interstitial prominence. LEFT cardiac defibrillator in situ. Multiple EKG lines overlie the patient and may obscure subtle underlying pathology.  IMPRESSION: LEFT central venous catheter tip projects in proximal superior vena cava. No pneumothorax. Stable ETT. Nasogastric tube past GE junction region.  Similar interstitial and bibasilar airspace opacities may reflect pulmonary edema, less likely pneumonia. Small LEFT pleural effusion.  Stable cardiomegaly.   Electronically Signed   By: Elon Alas   On: 07/21/2014 22:11    ASSESSMENT / PLAN:  PULMONARY ETT 12/23 >> 12/24 A: Acute on chronic hypoxic respiratory failure 2nd to PNA. Hx of COPD. Tobacco abuse. P:   Oxygen to keep SpO2 88 to 94% Pulmicort, brovana, spiriva with prn albuterol F/u CXR intermittently Nicotine patch Bronchial hygiene  CARDIOVASCULAR Lt IJ CVL 12/23 >> 12/25 A:  Septic shock 2nd to PNA >> resolved. Hx of HTN, CAD, PAD. Cardiomegaly on CXR. P:  Monitor hemodynamics F/u Echo Hold outpt lipitor, lasix, coreg Resume plavix  RENAL A:   AKI due to septic shock >> not sure what baseline renal fx is >> resolved. Hypokalemia, hypomagnesemia >> improved. P:   Monitor renal fx, urine outpt F/u and replace electrolytes as needed  GASTROINTESTINAL A:   Protein calorie malnutrition. P:   Pepcid for SUP Regular diet  HEMATOLOGIC A:   Anemia of critical  illness. P:  F/u CBC SQ heparin for DVT prevention  INFECTIOUS A:   Septic shock 2nd to PNA. P:   Day 3 vancomycin, levaquin F/u procalcitonin  Blood cx 12/23 [facility]) >>  MRSA screen 12/23 >> positive  ENDOCRINE A:  No acute issues. P:   Monitor blood sugar on BMET  NEUROLOGIC A:   Acute metabolic encephalopathy >> resolved. Deconditioning. Chronic pain. P:   PT/OT Continue home pain med regimen  DERMATOLOGY A: Verrucous lesion on forehead. Plan: Will need outpt f/u with dermatology  SUMMARY: Transfer to tele.  F/u Echo.  If cx's negative, then narrow Abx soon.  Chesley Mires, MD [facility] Pulmonary/Critical Care 07/23/2014, 9:49 AM Pager:  [phone] After 3pm call: [phone]

## 2014-07-24 DIAGNOSIS — Z720 Tobacco use: Secondary | ICD-10-CM

## 2014-07-24 DIAGNOSIS — J449 Chronic obstructive pulmonary disease, unspecified: Secondary | ICD-10-CM

## 2014-07-24 DIAGNOSIS — I5021 Acute systolic (congestive) heart failure: Secondary | ICD-10-CM

## 2014-07-24 LAB — BASIC METABOLIC PANEL
Anion gap: 6 (ref 5–15)
BUN: 6 mg/dL (ref 6–23)
CALCIUM: 7.5 mg/dL — AB (ref 8.4–10.5)
CO2: 28 mmol/L (ref 19–32)
CREATININE: 0.73 mg/dL (ref 0.50–1.10)
Chloride: 103 mEq/L (ref 96–112)
GFR calc Af Amer: >90 mL/min (ref >90)
GFR calc non Af Amer: 86 mL/min — ABNORMAL LOW (ref >90)
Glucose, Bld: 83 mg/dL (ref 70–99)
Potassium: 4.4 mmol/L (ref 3.5–5.1)
Sodium: 137 mmol/L (ref 135–145)

## 2014-07-24 LAB — CBC
HEMATOCRIT: 29.5 % — AB (ref 36.0–46.0)
Hemoglobin: 8.8 g/dL — ABNORMAL LOW (ref 12.0–15.0)
MCH: 22.3 pg — AB (ref 26.0–34.0)
MCHC: 29.8 g/dL — ABNORMAL LOW (ref 30.0–36.0)
MCV: 74.7 fL — AB (ref 78.0–100.0)
PLATELETS: 187 10*3/uL (ref 150–400)
RBC: 3.95 MIL/uL (ref 3.87–5.11)
RDW: 17.9 % — ABNORMAL HIGH (ref 11.5–15.5)
WBC: 8.4 10*3/uL (ref 4.0–10.5)

## 2014-07-24 MED ORDER — IRBESARTAN 75 MG PO TABS
75.0000 mg | ORAL_TABLET | Freq: Every day | ORAL | Status: DC
  Administered 2014-07-24 – 2014-07-28 (×5): 75 mg via ORAL
  Filled 2014-07-24 (×5): qty 1

## 2014-07-24 MED ORDER — HYDROMORPHONE HCL 2 MG PO TABS
2.0000 mg | ORAL_TABLET | ORAL | Status: DC | PRN
  Administered 2014-07-25 – 2014-07-28 (×9): 2 mg via ORAL
  Filled 2014-07-24 (×11): qty 1

## 2014-07-24 MED ORDER — CLONIDINE HCL 0.1 MG PO TABS
0.1000 mg | ORAL_TABLET | Freq: Three times a day (TID) | ORAL | Status: DC
  Administered 2014-07-24 – 2014-07-27 (×9): 0.1 mg via ORAL
  Filled 2014-07-24 (×12): qty 1

## 2014-07-24 MED ORDER — ALBUTEROL SULFATE (2.5 MG/3ML) 0.083% IN NEBU: 2.5000 mg | INHALATION_SOLUTION | RESPIRATORY_TRACT | Status: DC | PRN

## 2014-07-24 NOTE — Progress Notes (Signed)
PULMONARY / CRITICAL CARE MEDICINE   Name: Lynn Butler MRN: [id number] DOB: 1945-10-22    ADMISSION DATE:  07/21/2014   INITIAL PRESENTATION:  Transferred to ICU/PCCM service from [facility] after intubation in their ED with dx of acute on chronic resp failure, LLL  PNA, septic shock  STUDIES/SIGNIFICANT EVENTS: 12/23 Transfer from [facility] 12/24 Extubated 12/25 Transfer to telemetry 12/26 ech Left ventricle: The cavity size was normal. Systolic function was moderately reduced. The estimated ejection fraction was in the range of 35% to 40%. Severe hypokinesis of the lateral, inferolateral, and inferoseptal myocardium. Hypokinesis of the anteroseptal myocardium. Doppler parameters are consistent with abnormal left ventricular relaxation (grade 1 diastolic dysfunction). - Left atrium: The atrium was mildly dilated. - Right ventricle: The cavity size was mildly dilated. Wall thickness was normal. - Right atrium: The atrium was mildly dilated. - Pulmonary arteries: Systolic pressure was mildly increased. PA peak pressure: 32 mm Hg   SUBJECTIVE:  Still somewhat congested sounding cough / swallowing ok s concern with asp.  VITAL SIGNS: Temp:  [97.5 F (36.4 C)-99 F (37.2 C)] 97.5 F (36.4 C) (12/26 1300) Pulse Rate:  [73-94] 85 (12/26 1300) Resp:  [16-21] 16 (12/26 0527) BP: (111-141)/(53-99) 141/99 mmHg (12/26 1300) SpO2:  [93 %-99 %] 96 % (12/26 1300) Weight:  [109 lb 5.6 oz (49.6 kg)] 109 lb 5.6 oz (49.6 kg) (12/26 0527)  FIO2 3lpm   INTAKE / OUTPUT:  Intake/Output Summary (Last 24 hours) at 07/24/14 1615 Last data filed at 07/24/14 1400  Gross per 24 hour  Intake 1195.83 ml  Output   1101 ml  Net  94.83 ml    PHYSICAL EXAMINATION: General: chronically ill appearing, sitting in chair Neuro: normal strength HEENT: Large verrucous lesion on forehead Cardiovascular: regular, no murmur Lungs: diminished breath sounds bilaterally  without  wheezes Abdomen: Soft, non tender Ext: no edema Skin: chronic ulceration Lt anterior lower leg  LABS: CBC Recent Labs     07/22/14  0330  07/23/14  0455  07/24/14  0350  WBC  15.3*  7.3  8.4  HGB  10.1*  8.9*  8.8*  HCT  33.8*  28.8*  29.5*  PLT  215  153  187   BMET Recent Labs     07/22/14  0330  07/23/14  0455  07/24/14  0350  NA  134*  136  137  K  3.4*  3.9  4.4  CL  104  105  103  CO2  23  28  28   BUN  20  9  6   CREATININE  1.31*  0.80  0.73  GLUCOSE  120*  93  83    Electrolytes Recent Labs     07/22/14  0330  07/23/14  0455  07/24/14  0350  CALCIUM  7.2*  7.4*  7.5*  MG  1.6  1.8   --   PHOS  3.1   --    --     Sepsis Markers Recent Labs     07/21/14  2006  07/22/14  0330  07/23/14  0455  PROCALCITON  8.48  8.12  5.21    ABG Recent Labs     07/22/14  0531  PHART  7.304*  PCO2ART  45.3*  PO2ART  96.0    Glucose Recent Labs     07/21/14  1944  GLUCAP  148*    Imaging Dg Chest Port 1 View  07/23/2014   CLINICAL DATA:  Pneumonia.  Shortness of breath.  EXAM: PORTABLE CHEST - 1 VIEW  COMPARISON:  07/22/2014  FINDINGS: Left IJ line tip: SVC. Atherosclerotic aortic arch. AICD remains in place.  Endotracheal tube removed.  Nasogastric tube removed.  Moderate enlargement of the cardiopericardial silhouette indistinct pulmonary vasculature with interstitial accentuation of both lung bases. Confluent retrocardiac airspace opacity in the left lower lobe. Mild blunting of the left lateral costophrenic angle.  IMPRESSION: 1. Mild increase in airspace opacity at the left lung base. Underlying interstitial opacity bilaterally favoring edema. Moderate enlargement of the cardiopericardial silhouette 2. Endotracheal and nasogastric tubes have been removed. 3. Suspected small left pleural effusion.   Electronically Signed   By: Sherryl Barters M.D.   On: 07/23/2014 08:40    ASSESSMENT / PLAN:  PULMONARY ETT 12/23 >> 12/24 A: Acute on chronic hypoxic  respiratory failure 2nd to PNA. Hx of COPD. Tobacco abuse. P:   Oxygen to keep SpO2 88 to 94% Pulmicort, brovana, spiriva with prn albuterol F/u CXR intermittently Nicotine patch Bronchial hygiene> added flutter 12/26   CARDIOVASCULAR Lt IJ CVL 12/23 >> 12/25 A:  Septic shock 2nd to PNA >> resolved. Hx of HTN, CAD, PAD. Systolic chf by echo 52/77 . P:  Monitor hemodynamics  Hold outpt lipitor, lasix, coreg Resume plavix Keep bp down / f/u cards   RENAL A:   AKI due to septic shock >> not sure what baseline renal fx is >> resolved. Hypokalemia, hypomagnesemia >> improved. P:   Monitor renal fx, urine outpt F/u and replace electrolytes as needed  GASTROINTESTINAL A:   Protein calorie malnutrition. P:   Pepcid for SUP Regular diet  HEMATOLOGIC A:   Anemia of critical illness. P:  F/u CBC SQ heparin for DVT prevention  INFECTIOUS A:   Septic shock 2nd to PNA. P:   Day 4 vancomycin, levaquin F/u procalcitonin  Blood cx 12/23 [facility]) >>  MRSA screen 12/23 >> positive  ENDOCRINE A:  No acute issues. P:   Monitor blood sugar on BMET  NEUROLOGIC A:   Acute metabolic encephalopathy >> resolved. Deconditioning. Chronic pain. P:   PT/OT Continue home pain med regimen  DERMATOLOGY A: Verrucous lesion on forehead. Plan: Will need outpt f/u with dermatology   Christinia Gully, MD Pulmonary and [place] [phone] After 5:30 PM or weekends, call [phone]

## 2014-07-24 NOTE — Progress Notes (Signed)
Echocardiogram 2D Echocardiogram has been performed.  Joelene Millin 07/24/2014, 9:17 AM

## 2014-07-24 NOTE — Progress Notes (Signed)
ANTIBIOTIC CONSULT NOTE - INITIAL  Pharmacy Consult for vancomycin  Indication: Sepsis/pneumonia  Allergies  Allergen Reactions  . Nsaids Nausea And Vomiting and Other (See Comments)    GI distress, burning    Patient Measurements: Height: 5' 2.5" (158.8 cm) Weight: 109 lb 5.6 oz (49.6 kg) IBW/kg (Calculated) : 51.25   Vital Signs: Temp: 97.5 F (36.4 C) (12/26 1300) Temp Source: Oral (12/26 1300) BP: 141/99 mmHg (12/26 1300) Pulse Rate: 85 (12/26 1300) Intake/Output from previous day: 12/25 0701 - 12/26 0700 In: 882.8 [P.O.:717; I.V.:15.8; IV Piggyback:150] Out: 1 [Stool:1] Intake/Output from this shift: Total I/O In: 840 [P.O.:840] Out: 1100 [Urine:1100]  Labs:  Recent Labs  07/22/14 0330 07/23/14 0455 07/24/14 0350  WBC 15.3* 7.3 8.4  HGB 10.1* 8.9* 8.8*  PLT 215 153 187  CREATININE 1.31* 0.80 0.73   Estimated Creatinine Clearance: 53.4 mL/min (by C-G formula based on Cr of 0.73). No results for input(s): VANCOTROUGH, VANCOPEAK, VANCORANDOM, GENTTROUGH, GENTPEAK, GENTRANDOM, TOBRATROUGH, TOBRAPEAK, TOBRARND, AMIKACINPEAK, AMIKACINTROU, AMIKACIN in the last 72 hours.   Microbiology: Recent Results (from the past 720 hour(s))  MRSA PCR Screening     Status: Abnormal   Collection Time: 07/21/14  7:51 PM  Result Value Ref Range Status   MRSA by PCR POSITIVE (A) NEGATIVE Final    Comment:        The GeneXpert MRSA Assay (FDA approved for NASAL specimens only), is one component of a comprehensive MRSA colonization surveillance program. It is not intended to diagnose MRSA infection nor to guide or monitor treatment for MRSA infections. RESULT CALLED TO, READ BACK BY AND VERIFIED WITH: L CLINE RN [id number] 07/21/14 A BROWNING   Culture, respiratory (NON-Expectorated)     Status: None   Collection Time: 07/21/14  9:05 PM  Result Value Ref Range Status   Specimen Description TRACHEAL ASPIRATE  Final   Special Requests NONE  Final   Gram Stain   Final    FEW  WBC PRESENT,BOTH PMN AND MONONUCLEAR RARE SQUAMOUS EPITHELIAL CELLS PRESENT NO ORGANISMS SEEN Performed at [facility]    Culture   Final    Non-Pathogenic Oropharyngeal-type Flora Isolated. Performed at [facility]    Report Status 07/23/2014 FINAL  Final    Medical History: Past Medical History  Diagnosis Date  . COPD (chronic obstructive pulmonary disease)   . Hypertension   . CHF (congestive heart failure)   . Coronary artery disease     Assessment: 68 year old female transferred from [place] to [facility] for septic shock. Patient received vancomycin and zosyn at [facility] (1600).  -Elevated wbc at 21 -Elevated scr at 1.8 -Fever 103.3  Pt is now afebrile, WBC wnl, sCr improving to 0.73 with CrCl > 74mL/min.  Goal of Therapy:  Vancomycin trough level 15-20 mcg/ml  Plan:  Vancomycin 500mg  IV q24h Measure antibiotic drug levels at steady state Follow up culture results and renal function  Recommend increasing levaquin to 750mg  q24h based on improved renal function If continued, will check VT 12/27  Andrey Cota. Diona Foley, PharmD Clinical Pharmacist Pager [phone] 07/24/2014 3:30 PM

## 2014-07-24 NOTE — Evaluation (Signed)
Physical Therapy Evaluation Patient Details Name: Lynn Butler MRN: [id number] DOB: 1945/12/05 Today's Date: 07/24/2014   History of Present Illness  Pt adm from [facility] with VDRF, PNA, and shock. Extubated 12/24. PMH -  COPD, PVD, CHF  Clinical Impression  Pt admitted with above diagnosis. Pt currently with functional limitations due to the deficits listed below (see PT Problem List).  Pt will benefit from skilled PT to increase their independence and safety with mobility to allow discharge to the venue listed below.  Pt needs assist for mobility. Recommend ST-SNF unless daughter willing/able to provide assist.      Follow Up Recommendations SNF (unless daughter is willing/able to provide 24 hour assist.)    Equipment Recommendations  None recommended by PT    Recommendations for Other Services       Precautions / Restrictions Precautions Precautions: Fall      Mobility  Bed Mobility Overal bed mobility: Needs Assistance Bed Mobility: Supine to Sit     Supine to sit: Min guard;HOB elevated     General bed mobility comments: Incr time  Transfers Overall transfer level: Needs assistance Equipment used: Rolling walker (2 wheeled) Transfers: Sit to/from Stand Sit to Stand: Min assist         General transfer comment: Assist to bring hips up.  Ambulation/Gait Ambulation/Gait assistance: Min assist Ambulation Distance (Feet): 5 Feet Assistive device: Rolling walker (2 wheeled) Gait Pattern/deviations: Step-to pattern;Decreased step length - right;Decreased stance time - left;Ataxic;Trunk flexed   Gait velocity interpretation: Below normal speed for age/gender General Gait Details: Assist for balance.  Stairs            Wheelchair Mobility    Modified Rankin (Stroke Patients Only)       Balance Overall balance assessment: Needs assistance Sitting-balance support: No upper extremity supported;Feet supported Sitting balance-Leahy Scale: Good      Standing balance support: Bilateral upper extremity supported Standing balance-Leahy Scale: Poor Standing balance comment: Walker and min A for static standing.                             Pertinent Vitals/Pain Pain Assessment: 0-10 Pain Score: 9  Pain Location: lungs and lt leg Pain Descriptors / Indicators: Constant Pain Intervention(s): Limited activity within patient's tolerance;Monitored during session;Premedicated before session;Repositioned    Home Living Family/patient expects to be discharged to:: Private residence Living Arrangements: Children Available Help at Discharge: Family Type of Home: House Home Access: Stairs to enter Entrance Stairs-Rails: Right Entrance Stairs-Number of Steps: 3 Home Layout: One level Home Equipment: Environmental consultant - 2 wheels;Bedside commode;Wheelchair - manual      Prior Function Level of Independence: Independent with assistive device(s)         Comments: Amb using walker at times.     Hand Dominance        Extremity/Trunk Assessment   Upper Extremity Assessment: Defer to OT evaluation           Lower Extremity Assessment: Generalized weakness         Communication   Communication: No difficulties  Cognition Arousal/Alertness: Awake/alert Behavior During Therapy: WFL for tasks assessed/performed Overall Cognitive Status: Within Functional Limits for tasks assessed                      General Comments      Exercises        Assessment/Plan    PT Assessment Patient needs continued PT services  PT Diagnosis Difficulty walking;Generalized weakness   PT Problem List Decreased strength;Decreased activity tolerance;Decreased balance;Decreased mobility;Pain  PT Treatment Interventions DME instruction;Gait training;Functional mobility training;Therapeutic exercise;Therapeutic activities;Patient/family education;Balance training   PT Goals (Current goals can be found in the Care Plan section) Acute  Rehab PT Goals Patient Stated Goal: Return home PT Goal Formulation: With patient Time For Goal Achievement: 07/31/14 Potential to Achieve Goals: Good    Frequency Min 3X/week   Barriers to discharge        Co-evaluation               End of Session Equipment Utilized During Treatment: Gait belt;Oxygen Activity Tolerance: Patient limited by fatigue;Patient limited by pain Patient left: in chair;with call bell/phone within reach;with chair alarm set Nurse Communication: Mobility status         Time: 3254-9826 PT Time Calculation (min) (ACUTE ONLY): 25 min   Charges:   PT Evaluation $Initial PT Evaluation Tier I: 1 Procedure PT Treatments $Gait Training: 8-22 mins   PT G Codes:        MAYCOCK,CARY 2014/08/06, 11:39 AM  Suanne Marker PT [phone]

## 2014-07-25 LAB — GLUCOSE, CAPILLARY: Glucose-Capillary: 93 mg/dL (ref 70–99)

## 2014-07-25 MED ORDER — LEVOFLOXACIN IN D5W 750 MG/150ML IV SOLN
750.0000 mg | INTRAVENOUS | Status: DC
  Administered 2014-07-25 – 2014-07-26 (×2): 750 mg via INTRAVENOUS
  Filled 2014-07-25 (×3): qty 150

## 2014-07-25 NOTE — Progress Notes (Signed)
PULMONARY / CRITICAL CARE MEDICINE   Name: Lynn Butler MRN: [id number] DOB: [birth year]    ADMISSION DATE:  07/21/2014   INITIAL PRESENTATION:  Transferred to ICU/PCCM service from [facility] after intubation in their ED with dx of acute on chronic resp failure, LLL  PNA, septic shock  STUDIES/SIGNIFICANT EVENTS: 12/23 Transfer from [facility] 12/24 Extubated 12/25 Transfer to telemetry 12/26 ech Left ventricle: The cavity size was normal. Systolic function was moderately reduced. The estimated ejection fraction was in the range of 35% to 40%. Severe hypokinesis of the lateral, inferolateral, and inferoseptal myocardium. Hypokinesis of the anteroseptal myocardium. Doppler parameters are consistent with abnormal left ventricular relaxation (grade 1 diastolic dysfunction). - Left atrium: The atrium was mildly dilated. - Right ventricle: The cavity size was mildly dilated. Wall thickness was normal. - Right atrium: The atrium was mildly dilated. - Pulmonary arteries: Systolic pressure was mildly increased. PA peak pressure: 32 mm Hg   SUBJECTIVE:  Still very  congested sounding cough / swallowing ok s concern with asp.  VITAL SIGNS: Temp:  [97.5 F (36.4 C)-98.2 F (36.8 C)] 98.2 F (36.8 C) (12/27 0540) Pulse Rate:  [75-94] 84 (12/27 0930) Resp:  [18-20] 18 (12/27 0930) BP: (111-141)/(53-99) 119/56 mmHg (12/27 0930) SpO2:  [93 %-96 %] 94 % (12/27 1026) Weight:  [110 lb 10.7 oz (50.2 kg)] 110 lb 10.7 oz (50.2 kg) (12/27 0541)  FIO2 3lpm   INTAKE / OUTPUT:  Intake/Output Summary (Last 24 hours) at 07/25/14 1116 Last data filed at 07/25/14 0900  Gross per 24 hour  Intake    700 ml  Output    800 ml  Net   -100 ml    PHYSICAL EXAMINATION: General: chronically ill appearing, lying in bed hob 30 degrees up Neuro: normal strength HEENT: Large verrucous lesion on forehead Cardiovascular: regular, no murmur Lungs: diminished breath sounds bilaterally  without  wheezes Abdomen: Soft, non tender Ext: no edema Skin: chronic ulceration Lt anterior lower leg  LABS: CBC Recent Labs     07/23/14  0455  07/24/14  0350  WBC  7.3  8.4  HGB  8.9*  8.8*  HCT  28.8*  29.5*  PLT  153  187   BMET Recent Labs     07/23/14  0455  07/24/14  0350  NA  136  137  K  3.9  4.4  CL  105  103  CO2  28  28  BUN  9  6  CREATININE  0.80  0.73  GLUCOSE  93  83    Electrolytes Recent Labs     07/23/14  0455  07/24/14  0350  CALCIUM  7.4*  7.5*  MG  1.8   --     Sepsis Markers Recent Labs     07/23/14  0455  PROCALCITON  5.21    ABG No results for input(s): PHART, PCO2ART, PO2ART in the last 72 hours.  Glucose Recent Labs     07/25/14  0528  GLUCAP  93    Imaging No results found.  ASSESSMENT / PLAN:  PULMONARY ETT 12/23 >> 12/24 A: Acute on chronic hypoxic respiratory failure 2nd to PNA. Hx of COPD. Tobacco abuse. P:   Oxygen to keep SpO2 88 to 94% Pulmicort, brovana, spiriva with prn albuterol  Nicotine patch Bronchial hygiene> added flutter 12/26   CARDIOVASCULAR Lt IJ CVL 12/23 >> 12/25 A:  Septic shock 2nd to PNA >> resolved. Hx of HTN, CAD, PAD. Systolic chf by echo 01/09 .  P:   Holding  outpt lipitor, lasix,  Resume plavix Added avapro 12/26 to clonidine With good bp control      RENAL A:   AKI due to septic shock >> not sure what baseline renal fx is >> resolved. Hypokalemia, hypomagnesemia >> improved. P:   Monitor renal fx, urine outpt F/u and replace electrolytes as needed  GASTROINTESTINAL A:   Protein calorie malnutrition. P:   Pepcid for SUP Regular diet  HEMATOLOGIC A:   Anemia of critical illness. P:  F/u CBC SQ heparin for DVT prevention  INFECTIOUS A:   Septic shock 2nd to PNA.  Blood cx 12/23 [facility]) >>  MRSA screen 12/23 >> positive  P  - Levaquin 12/23 >>> - Vanc 12/24 >>>        ENDOCRINE A:  No acute issues. P:   Monitor blood sugar on  BMET  NEUROLOGIC A:   Acute metabolic encephalopathy >> resolved. Deconditioning. Chronic pain. P:   PT/OT Continue home pain med regimen  DERMATOLOGY A: Verrucous lesion on forehead. Plan: Will need outpt f/u with dermatology     Christinia Gully, MD Pulmonary and [place] [phone] After 5:30 PM or weekends, call [phone]

## 2014-07-25 NOTE — Progress Notes (Signed)
Occupational Therapy Evaluation Patient Details Name: Lynn Butler MRN: [id number] DOB: Dec 05, 1945 Today's Date: 07/25/2014    History of Present Illness Pt adm from [facility] with VDRF, PNA, and shock. Extubated 12/24. PMH -  COPD, PVD, CHF   Clinical Impression   PTA pt lived at home and was independent with use of RW. Pt currently limited by pain in LLE, decreased endurance, and generalized weakness which impair her independence with ADLs. Pt's daughter has 2 small children and doubt that she can provide level of assist that pt needs if d/c home. Feel that safest option would be d/c to SNF for ST rehab. Pt will benefit from acute OT to address independence with ADLs.     Follow Up Recommendations  SNF;Supervision/Assistance - 24 hour    Equipment Recommendations  None recommended by OT    Recommendations for Other Services       Precautions / Restrictions Precautions Precautions: Fall Restrictions Weight Bearing Restrictions: No      Mobility Bed Mobility Overal bed mobility: Needs Assistance Bed Mobility: Supine to Sit     Supine to sit: Min assist     General bed mobility comments: Min (A) with HOB flat. Increased time to complete.  Transfers Overall transfer level: Needs assistance Equipment used: 1 person hand held assist Transfers: Sit to/from [facility] Sit to Stand: Min assist Stand pivot transfers: Min assist       General transfer comment: Min (A) to power up and to balance in standing.          ADL Overall ADL's : Needs assistance/impaired Eating/Feeding: Independent;Sitting   Grooming: Set up;Sitting   Upper Body Bathing: Set up;Sitting   Lower Body Bathing: Moderate assistance;Sit to/from stand   Upper Body Dressing : Set up;Sitting   Lower Body Dressing: Maximal assistance;Sit to/from stand   Toilet Transfer: Minimal assistance;Stand-pivot;BSC (1 person hand held assist) Toilet Transfer Details (indicate cue type and  reason): pt slow moving and requires VC's.  Toileting- Clothing Manipulation and Hygiene: Set up;Sitting/lateral lean         General ADL Comments: Pt is limited by fatigue and deconditioning as well as generalized weakness and L foot pain. Pt presents with decreased endurance for ADLs and functional mobility. Pt's daughter has a 67mo old and a 15yr old and feel that she could not provide level of assist that pt requires.      Vision  Pt reports no change from baseline.                    Perception Perception Perception Tested?: No   Praxis Praxis Praxis tested?: Within functional limits    Pertinent Vitals/Pain Pain Assessment: 0-10 Pain Score: 7  Pain Location: left food  Pain Descriptors / Indicators: Throbbing Pain Intervention(s): Limited activity within patient's tolerance;Monitored during session;Repositioned     Hand Dominance Right   Extremity/Trunk Assessment Upper Extremity Assessment Upper Extremity Assessment: Generalized weakness   Lower Extremity Assessment Lower Extremity Assessment: Generalized weakness   Cervical / Trunk Assessment Cervical / Trunk Assessment: Kyphotic   Communication Communication Communication: No difficulties   Cognition Arousal/Alertness: Awake/alert Behavior During Therapy: WFL for tasks assessed/performed Overall Cognitive Status: Within Functional Limits for tasks assessed                                Home Living Family/patient expects to be discharged to:: Private residence Living Arrangements: Children Available Help at  Discharge: Family Type of Home: House Home Access: Stairs to enter [employer] of Steps: 3 Entrance Stairs-Rails: Right Home Layout: One level     Bathroom Shower/Tub: Occupational psychologist: [place]: Environmental consultant - 2 wheels;Bedside commode;Wheelchair - manual;Shower seat          Prior Functioning/Environment Level of Independence:  Independent with assistive device(s)        Comments: Amb using walker at times.    OT Diagnosis: Generalized weakness;Acute pain   OT Problem List: Decreased strength;Decreased activity tolerance;Impaired balance (sitting and/or standing);Cardiopulmonary status limiting activity;Pain   OT Treatment/Interventions: Self-care/ADL training;Therapeutic exercise;Energy conservation;DME and/or AE instruction;Therapeutic activities;Patient/family education;Balance training    OT Goals(Current goals can be found in the care plan section) Acute Rehab OT Goals Patient Stated Goal: to get back home OT Goal Formulation: With patient Time For Goal Achievement: 08/08/14 Potential to Achieve Goals: Good ADL Goals Pt Will Perform Grooming: with supervision;standing Pt Will Perform Lower Body Bathing: with set-up;with supervision;sit to/from stand Pt Will Perform Lower Body Dressing: with set-up;with supervision;sit to/from stand Pt Will Transfer to Toilet: with supervision;ambulating Pt Will Perform Toileting - Clothing Manipulation and hygiene: with supervision;sit to/from stand  OT Frequency: Min 1X/week   Barriers to D/C: Decreased caregiver support             End of Session Equipment Utilized During Treatment: Gait belt Nurse Communication: Mobility status  Activity Tolerance: Patient tolerated treatment well Patient left: in bed;with call bell/phone within reach   Time: 0831-0901 OT Time Calculation (min): 30 min Charges:  OT General Charges $OT Visit: 1 Procedure OT Evaluation $Initial OT Evaluation Tier I: 1 Procedure OT Treatments $Self Care/Home Management : 8-22 mins G-Codes:    Juluis Rainier 08/19/2014, 9:18 AM   Cyndie Chime, OTR/L Occupational Therapist [phone] (pager)

## 2014-07-26 ENCOUNTER — Inpatient Hospital Stay (HOSPITAL_COMMUNITY): Payer: Medicare Other

## 2014-07-26 LAB — CBC
HCT: 30.3 % — ABNORMAL LOW (ref 36.0–46.0)
Hemoglobin: 9.1 g/dL — ABNORMAL LOW (ref 12.0–15.0)
MCH: 22.8 pg — AB (ref 26.0–34.0)
MCHC: 30 g/dL (ref 30.0–36.0)
MCV: 75.9 fL — AB (ref 78.0–100.0)
Platelets: 185 10*3/uL (ref 150–400)
RBC: 3.99 MIL/uL (ref 3.87–5.11)
RDW: 17.9 % — ABNORMAL HIGH (ref 11.5–15.5)
WBC: 4.7 10*3/uL (ref 4.0–10.5)

## 2014-07-26 LAB — BASIC METABOLIC PANEL
Anion gap: 7 (ref 5–15)
BUN: 6 mg/dL (ref 6–23)
CHLORIDE: 99 meq/L (ref 96–112)
CO2: 33 mmol/L — AB (ref 19–32)
CREATININE: 0.68 mg/dL (ref 0.50–1.10)
Calcium: 8.1 mg/dL — ABNORMAL LOW (ref 8.4–10.5)
GFR calc Af Amer: >90 mL/min (ref >90)
GFR calc non Af Amer: 89 mL/min — ABNORMAL LOW (ref >90)
Glucose, Bld: 91 mg/dL (ref 70–99)
Potassium: 4.1 mmol/L (ref 3.5–5.1)
Sodium: 139 mmol/L (ref 135–145)

## 2014-07-26 LAB — CULTURE, BLOOD (SINGLE)

## 2014-07-26 LAB — BRAIN NATRIURETIC PEPTIDE: B Natriuretic Peptide: 592.4 pg/mL — ABNORMAL HIGH (ref 0.0–100.0)

## 2014-07-26 MED ORDER — FUROSEMIDE 40 MG PO TABS
40.0000 mg | ORAL_TABLET | Freq: Every day | ORAL | Status: DC
  Administered 2014-07-27 – 2014-07-28 (×2): 40 mg via ORAL
  Filled 2014-07-26 (×2): qty 1

## 2014-07-26 MED ORDER — FUROSEMIDE 10 MG/ML IJ SOLN
40.0000 mg | Freq: Once | INTRAMUSCULAR | Status: AC
  Administered 2014-07-26: 40 mg via INTRAVENOUS
  Filled 2014-07-26: qty 4

## 2014-07-26 MED ORDER — VANCOMYCIN HCL IN DEXTROSE 1-5 GM/200ML-% IV SOLN
1000.0000 mg | INTRAVENOUS | Status: DC
  Administered 2014-07-27: 1000 mg via INTRAVENOUS
  Filled 2014-07-26 (×2): qty 200

## 2014-07-26 NOTE — Progress Notes (Signed)
Physical Therapy Treatment Patient Details Name: Lynn Butler MRN: [id number] DOB: 10-Sep-1945 Today's Date: 2014/08/14    History of Present Illness Pt adm from [facility] with VDRF, PNA, and shock. Extubated 12/24. PMH -  COPD, PVD, CHF    PT Comments    Pt making steady progress.  Follow Up Recommendations  SNF (unless daughter able to provide needed 24 hour assist.)     Equipment Recommendations  None recommended by PT    Recommendations for Other Services       Precautions / Restrictions Precautions Precautions: Fall Restrictions Weight Bearing Restrictions: No    Mobility  Bed Mobility Overal bed mobility: Needs Assistance Bed Mobility: Supine to Sit     Supine to sit: Supervision        Transfers Overall transfer level: Needs assistance Equipment used: Rolling walker (2 wheeled) Transfers: Sit to/from Stand Sit to Stand: Min guard         General transfer comment: Assist for safety  Ambulation/Gait Ambulation/Gait assistance: Min guard Ambulation Distance (Feet): 12 Feet (x 2) Assistive device: Rolling walker (2 wheeled) Gait Pattern/deviations: Step-through pattern;Decreased step length - right;Decreased step length - left;Decreased stance time - left;Antalgic   Gait velocity interpretation: Below normal speed for age/gender General Gait Details: Limp on lt due to pain. Improved gait with shorter walker.   Stairs            Wheelchair Mobility    Modified Rankin (Stroke Patients Only)       Balance Overall balance assessment: Needs assistance Sitting-balance support: No upper extremity supported Sitting balance-Leahy Scale: Good     Standing balance support: Bilateral upper extremity supported Standing balance-Leahy Scale: Poor Standing balance comment: walker and supervision for static standing.                    Cognition Arousal/Alertness: Awake/alert Behavior During Therapy: WFL for tasks  assessed/performed Overall Cognitive Status: Within Functional Limits for tasks assessed                      Exercises      General Comments        Pertinent Vitals/Pain Pain Score: 7  Pain Location: left foot Pain Descriptors / Indicators: Throbbing Pain Intervention(s): Limited activity within patient's tolerance;Monitored during session;Repositioned    Home Living                      Prior Function            PT Goals (current goals can now be found in the care plan section) Progress towards PT goals: Progressing toward goals    Frequency  Min 3X/week    PT Plan Current plan remains appropriate    Co-evaluation             End of Session Equipment Utilized During Treatment: Oxygen Activity Tolerance: Patient limited by fatigue;Patient limited by pain Patient left: in chair;with call bell/phone within reach;with chair alarm set     Time: 3235-5732 PT Time Calculation (min) (ACUTE ONLY): 30 min  Charges:  $Gait Training: 23-37 mins                    G Codes:      Lynn Butler 2014-08-14, 8:59 AM  [facility] PT [phone]

## 2014-07-26 NOTE — Progress Notes (Signed)
UR completed Zaydah Nawabi K. Nayla Dias, RN, BSN, MSHL, CCM  07/26/2014 10:35 AM

## 2014-07-26 NOTE — Progress Notes (Signed)
PULMONARY / CRITICAL CARE MEDICINE   Name: Lynn Butler MRN: [id number] DOB: May 11, 1946    ADMISSION DATE:  07/21/2014   INITIAL PRESENTATION:  Transferred to ICU/PCCM service from [facility] after intubation in their ED with dx of acute on chronic resp failure, LLL  PNA, septic shock  STUDIES/SIGNIFICANT EVENTS: 12/23 Transfer from [facility] 12/24 Extubated 12/25 Transfer to telemetry 12/26 echo EF  35% to 40%. WMA +  SUBJECTIVE:  C/o cough Dyspnea improving Afebrile C/o foot pain -asking for dilaudid  VITAL SIGNS: Temp:  [98.4 F (36.9 C)-98.5 F (36.9 C)] 98.4 F (36.9 C) (12/28 0526) Pulse Rate:  [68-90] 68 (12/28 0526) Resp:  [16-18] 16 (12/28 0526) BP: (101-151)/(47-74) 101/47 mmHg (12/28 0526) SpO2:  [95 %-97 %] 97 % (12/28 0526) Weight:  [49.17 kg (108 lb 6.4 oz)] 49.17 kg (108 lb 6.4 oz) (12/28 0526)  FIO2 3lpm   INTAKE / OUTPUT:  Intake/Output Summary (Last 24 hours) at 07/26/14 1257 Last data filed at 07/26/14 0600  Gross per 24 hour  Intake    850 ml  Output   2050 ml  Net  -1200 ml    PHYSICAL EXAMINATION: General: chronically ill appearing, lying in bed hob 30 degrees up Neuro: normal strength HEENT: Large verrucous lesion on forehead Cardiovascular: regular, no murmur Lungs: diminished breath sounds bilaterally  without wheezes Abdomen: Soft, non tender Ext: no edema Skin: chronic ulceration Lt anterior lower leg  LABS: CBC Recent Labs     07/24/14  0350  07/26/14  0546  WBC  8.4  4.7  HGB  8.8*  9.1*  HCT  29.5*  30.3*  PLT  187  185   BMET Recent Labs     07/24/14  0350  07/26/14  0546  NA  137  139  K  4.4  4.1  CL  103  99  CO2  28  33*  BUN  6  6  CREATININE  0.73  0.68  GLUCOSE  83  91    Electrolytes Recent Labs     07/24/14  0350  07/26/14  0546  CALCIUM  7.5*  8.1*    Sepsis Markers No results for input(s): PROCALCITON, O2SATVEN in the last 72 hours.  Invalid input(s): LACTICACIDVEN  ABG No results for  input(s): PHART, PCO2ART, PO2ART in the last 72 hours.  Glucose Recent Labs     07/25/14  0528  GLUCAP  93    Imaging Dg Chest 2 View  07/26/2014   CLINICAL DATA:  Shortness of breath  EXAM: CHEST  2 VIEW  COMPARISON:  Portable chest x-ray of July 23, 2014  FINDINGS: The right lung is well-expanded. There is increased right pleural effusion and basilar atelectasis however. On the left the retrocardiac region is more dense. The hemidiaphragm is obscured and the costophrenic angles are blunted. On the lateral film pleural fluid layers posteriorly, bilaterally. The cardiac silhouette is mildly enlarged. The pulmonary vascularity is prominent centrally but there is no definite cephalization. 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 The bony thorax exhibits no acute abnormality.  IMPRESSION: Mild CHF with worsening bilateral pleural effusions and basilar atelectasis/pneumonia especially on the left.   Electronically Signed   By: David  Martinique   On: 07/26/2014 08:03    ASSESSMENT / PLAN:  PULMONARY ETT 12/23 >> 12/24 A: Acute on chronic hypoxic respiratory failure 2nd to PNA. Hx of COPD. Tobacco abuse. P:   Oxygen to keep SpO2 88 to 94% Pulmicort, brovana, spiriva with  prn albuterol  Nicotine patch Bronchial hygiene> added flutter 12/26   CARDIOVASCULAR Lt IJ CVL 12/23 >> 12/25 A:  Septic shock 2nd to PNA >> resolved. Hx of HTN, CAD, PAD. Acute Systolic chf by echo 92/11 . P:   Holding  outpt lipitor Resume plavix Added avapro 12/26 to clonidine With good bp control     RENAL A:   AKI due to septic shock >> not sure what baseline renal fx is >> resolved. Hypokalemia, hypomagnesemia >> improved. P:   Monitor renal fx, urine outpt Resume lasix 40 F/u and replace electrolytes as needed  GASTROINTESTINAL A:   Protein calorie malnutrition. P:   Pepcid for SUP Regular diet  HEMATOLOGIC A:   Anemia of critical illness. P:  F/u CBC SQ heparin  for DVT prevention  INFECTIOUS A:   Septic shock 2nd to PNA.  Blood cx 12/23 [facility]) >>  MRSA screen 12/23 >> positive  P  - Levaquin 12/23 >>> - Vanc 12/24 >>>    STOP vanc if cx neg from [place]:  No acute issues. P:   Monitor blood sugar on BMET  NEUROLOGIC A:   Acute metabolic encephalopathy >> resolved. Deconditioning. Chronic pain - on high dose narcotics P:   PT/OT Continue home pain med regimen  DERMATOLOGY A: Verrucous lesion on forehead. Plan: Will need outpt f/u with dermatology  Dispo - she does not want to go to SNF, prefers home, SNF recommended by PT  Rigoberto Noel. MD

## 2014-07-26 NOTE — Care Management Note (Addendum)
    Page 2 of 2   07/28/2014     2:09:27 PM CARE MANAGEMENT NOTE 07/28/2014  Patient:  Lynn Butler, Lynn Butler   Account Number:  [id number]  Date Initiated:  07/22/2014  Documentation initiated by:  [facility]  Subjective/Objective Assessment:   Admitted from OSH with resp failure, sepsis.  Intubated and on pressors.     Action/Plan:   CM to follow for dispostion needs   Anticipated DC Date:  07/27/2014   Anticipated DC Plan:  SKILLED NURSING FACILITY  In-house referral  Clinical Social Worker      DC Forensic scientist  CM consult      [facility] Choice  HOME HEALTH  Resumption Of Svcs/PTA Provider   Choice offered to / List presented to:     DME arranged  [place]      DME agency  [place] arranged  HH-1 RN  [place] PT      [facility] agency  [facility]   Status of service:  Completed, signed off Medicare Important Message given?  YES (If response is "NO", the following Medicare IM given date fields will be blank) Date Medicare IM given:  07/26/2014 Medicare IM given by:  HUTCHINSON,CRYSTAL Date Additional Medicare IM given:   Additional Medicare IM given by:    Discharge Disposition:  [place]  Per UR Regulation:  Reviewed for med. necessity/level of care/duration of stay  If discussed at [place] of Stay Meetings, dates discussed:   07/27/2014    Comments:  07/28/14 Ellan Lambert, RN, BSN [phone] Pt for dc home today with daughter and [facility] care provided by [place].  Notified [facility] of dc date.  Pt changed her mind about hosp bed and wheelchair, and she does not want them now.  Instead, she wants rollator walker.  She feels her bed will be more comfortable, and she had her daughter purchase 3 big pillows to prop up on.  She states she really wanted an electric wheelchair, not a manual.  I advised pt to go through her PCP if electric WC was desired.  She feels rollator will be best b/c she can  sit down on it if she gets tired, and her daughter can fold up and keep in the car for appts.  Pt active with [facility] for home O2; daughter to transport home, and is bringing a portable O2 tank for the ride home.  Pt denies any other home needs. [place] notified of changes in DME needed.  Rollator delivered to room prior to dc.   Crystal Hutchinson RN, BSN, MSHL, CCM  Nurse - Case Manager,  (Unit [phone]   07/27/2014 Social:  From home with husband.  Supportive DTR/[place] Home DME:  Home Oxygen Disposition Plan update: Patient refuses SNF recommendations. Patient / DTR Elect  home with resumption of HHS - [facility] HHS:  RN, PT ([facility]/Tim notified) DME:  Hospital Bed, Transport w/c ([facility]/[place] notified)    Crystal Hutchinson RN, BSN, MSHL, CCM  Nurse - Case Manager,  (Unit [place])  [phone]   07/26/2014 Social:  From home with husband Disposition Plan:  SNF (See SW note for further details)   [place];  ADAMS,ELAINE M  [phone]

## 2014-07-27 MED ORDER — LEVOFLOXACIN 750 MG PO TABS
750.0000 mg | ORAL_TABLET | Freq: Every day | ORAL | Status: DC
  Administered 2014-07-27 – 2014-07-28 (×2): 750 mg via ORAL
  Filled 2014-07-27 (×2): qty 1

## 2014-07-27 NOTE — Progress Notes (Signed)
ANTIBIOTIC CONSULT NOTE   Pharmacy Consult for vancomycin  Indication: Sepsis/pneumonia  Allergies  Allergen Reactions  . Nsaids Nausea And Vomiting and Other (See Comments)    GI distress, burning    Patient Measurements: Height: 5' 2.5" (158.8 cm) Weight: 105 lb 6.1 oz (47.8 kg) IBW/kg (Calculated) : 51.25   Vital Signs: Temp: 97.5 F (36.4 C) (12/29 0408) Temp Source: Oral (12/29 0408) BP: 121/51 mmHg (12/29 0408) Pulse Rate: 80 (12/29 0408) Intake/Output from previous day: 12/28 0701 - 12/29 0700 In: 6834 [P.O.:1320; IV Piggyback:150] Out: 2700 [Urine:2700] Intake/Output from this shift: Total I/O In: 717 [P.O.:717] Out: 750 [Urine:750]  Labs:  Recent Labs  07/26/14 0546  WBC 4.7  HGB 9.1*  PLT 185  CREATININE 0.68   Estimated Creatinine Clearance: 51.5 mL/min (by C-G formula based on Cr of 0.68). No results for input(s): VANCOTROUGH, VANCOPEAK, VANCORANDOM, GENTTROUGH, GENTPEAK, GENTRANDOM, TOBRATROUGH, TOBRAPEAK, TOBRARND, AMIKACINPEAK, AMIKACINTROU, AMIKACIN in the last 72 hours.   Microbiology: Recent Results (from the past 720 hour(s))  MRSA PCR Screening     Status: Abnormal   Collection Time: 07/21/14  7:51 PM  Result Value Ref Range Status   MRSA by PCR POSITIVE (A) NEGATIVE Final    Comment:        The GeneXpert MRSA Assay (FDA approved for NASAL specimens only), is one component of a comprehensive MRSA colonization surveillance program. It is not intended to diagnose MRSA infection nor to guide or monitor treatment for MRSA infections. RESULT CALLED TO, READ BACK BY AND VERIFIED WITH: L CLINE RN [id number] 07/21/14 A BROWNING   Culture, respiratory (NON-Expectorated)     Status: None   Collection Time: 07/21/14  9:05 PM  Result Value Ref Range Status   Specimen Description TRACHEAL ASPIRATE  Final   Special Requests NONE  Final   Gram Stain   Final    FEW WBC PRESENT,BOTH PMN AND MONONUCLEAR RARE SQUAMOUS EPITHELIAL CELLS PRESENT NO  ORGANISMS SEEN Performed at [facility]    Culture   Final    Non-Pathogenic Oropharyngeal-type [place] Isolated. Performed at [facility]    Report Status 07/23/2014 FINAL  Final    Medical History: Past Medical History  Diagnosis Date  . COPD (chronic obstructive pulmonary disease)   . Hypertension   . CHF (congestive heart failure)   . Coronary artery disease     Assessment: 68 year old female transferred from [place] to [facility] for septic shock. Shock now resolved patient continues on vancomycin and levaquin. Awaiting final blood cx results from [facility] if negative plan is to d/c vancomycin. Currently on day #6 of levaquin  -wbc now normal at 4 -scr now normal at 0.6 -afebrille  Vanc 12/23>> Levaquin 12/23>> Zosyn x1 12/23  12/23 BC ([place]) - ngtd 12/23 MRSA - Pos. Via PCR 12/23 Resp>> no org seen  Goal of Therapy:  Vancomycin trough level 15-20 mcg/ml  Plan:  Vancomycin 1g q24 hours - changed 12/28 Holding off on vancomycin trough as abx likely to change in next 24 hours Follow up culture results and renal function   Erin Hearing PharmD., BCPS Clinical Pharmacist Pager [phone] 07/27/2014 1:39 PM

## 2014-07-27 NOTE — Progress Notes (Signed)
PULMONARY / CRITICAL CARE MEDICINE   Name: Lynn Butler MRN: [id number] DOB: [birth year]    ADMISSION DATE:  07/21/2014   INITIAL PRESENTATION:  Transferred to ICU/PCCM service from [facility] after intubation in their ED with dx of acute on chronic resp failure, LLL  PNA, septic shock  STUDIES/SIGNIFICANT EVENTS: 12/23 Transfer from [facility] 12/24 Extubated 12/25 Transfer to telemetry 12/26 echo EF  35% to 40%. WMA +  SUBJECTIVE:  afebrile Dyspnea improving C/o foot pain   VITAL SIGNS: Temp:  [97.5 F (36.4 C)-97.8 F (36.6 C)] 97.6 F (36.4 C) (12/29 1456) Pulse Rate:  [75-80] 75 (12/29 1456) Resp:  [18-20] 18 (12/29 1456) BP: (92-121)/(51-58) 92/58 mmHg (12/29 1507) SpO2:  [95 %-99 %] 97 % (12/29 1456) Weight:  [105 lb 6.1 oz (47.8 kg)] 105 lb 6.1 oz (47.8 kg) (12/29 0408)    INTAKE / OUTPUT:  Intake/Output Summary (Last 24 hours) at 07/27/14 1745 Last data filed at 07/27/14 1600  Gross per 24 hour  Intake   2034 ml  Output   3200 ml  Net  -1166 ml    PHYSICAL EXAMINATION: General: chronically ill appearing, lying in bed hob 30 degrees up Neuro: normal strength HEENT: Large verrucous lesion on forehead Cardiovascular: regular, no murmur Lungs: diminished breath sounds bilaterally  without wheezes Abdomen: Soft, non tender Ext: no edema Skin: chronic ulceration Lt anterior lower leg  LABS: CBC Recent Labs     07/26/14  0546  WBC  4.7  HGB  9.1*  HCT  30.3*  PLT  185   BMET Recent Labs     07/26/14  0546  NA  139  K  4.1  CL  99  CO2  33*  BUN  6  CREATININE  0.68  GLUCOSE  91    Electrolytes Recent Labs     07/26/14  0546  CALCIUM  8.1*    Sepsis Markers No results for input(s): PROCALCITON, O2SATVEN in the last 72 hours.  Invalid input(s): LACTICACIDVEN  ABG No results for input(s): PHART, PCO2ART, PO2ART in the last 72 hours.  Glucose Recent Labs     07/25/14  0528  GLUCAP  93    Imaging Dg Chest 2 View  07/26/2014    CLINICAL DATA:  Shortness of breath  EXAM: CHEST  2 VIEW  COMPARISON:  Portable chest x-ray of July 23, 2014  FINDINGS: The right lung is well-expanded. There is increased right pleural effusion and basilar atelectasis however. On the left the retrocardiac region is more dense. The hemidiaphragm is obscured and the costophrenic angles are blunted. On the lateral film pleural fluid layers posteriorly, bilaterally. The cardiac silhouette is mildly enlarged. The pulmonary vascularity is prominent centrally but there is no definite cephalization. 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 The bony thorax exhibits no acute abnormality.  IMPRESSION: Mild CHF with worsening bilateral pleural effusions and basilar atelectasis/pneumonia especially on the left.   Electronically Signed   By: David  Martinique   On: 07/26/2014 08:03    ASSESSMENT / PLAN:  PULMONARY ETT 12/23 >> 12/24 A: Acute on chronic hypoxic respiratory failure 2nd to PNA. Hx of COPD. Tobacco abuse. P:   Oxygen to keep SpO2 88 to 94% Pulmicort, brovana, spiriva with prn albuterol  Nicotine patch Bronchial hygiene> added flutter 12/26   CARDIOVASCULAR Lt IJ CVL 12/23 >> 12/25 A:  Septic shock 2nd to PNA >> resolved. Hx of HTN, CAD, PAD. Acute Systolic chf by echo 18/56 . P:  Holding  outpt lipitor Resume plavix Added avapro 12/26 , dc clonidine -soft BP    RENAL A:   AKI due to septic shock >> not sure what baseline renal fx is >> resolved. Hypokalemia, hypomagnesemia >> improved. P:   Monitor renal fx, urine outpt Resume lasix 40 F/u and replace electrolytes as needed  GASTROINTESTINAL A:   Protein calorie malnutrition. P:   Pepcid for SUP Regular diet  HEMATOLOGIC A:   Anemia of critical illness. P:  F/u CBC SQ heparin for DVT prevention  INFECTIOUS A:   Septic shock 2nd to PNA.  Blood cx 12/23 [facility]) >>  MRSA screen 12/23 >> positive  P  - Levaquin 12/23 >>> - Vanc 12/24  >>>12/29    STOP vanc     ENDOCRINE A:  No acute issues. P:   Monitor blood sugar on BMET  NEUROLOGIC A:   Acute metabolic encephalopathy >> resolved. Deconditioning. Chronic pain - on high dose narcotics P:   PT/OT Continue home pain med regimen  DERMATOLOGY A: Verrucous lesion on forehead. Plan: Will need outpt f/u with dermatology  Dispo - she does not want to go to SNF, prefers home, SNF recommended by PT Plan for dc 12/30  Rigoberto Noel. MD

## 2014-07-28 LAB — BASIC METABOLIC PANEL
Anion gap: 7 (ref 5–15)
BUN: 11 mg/dL (ref 6–23)
CALCIUM: 8.6 mg/dL (ref 8.4–10.5)
CHLORIDE: 98 meq/L (ref 96–112)
CO2: 35 mmol/L — AB (ref 19–32)
CREATININE: 0.84 mg/dL (ref 0.50–1.10)
GFR calc Af Amer: 82 mL/min — ABNORMAL LOW (ref >90)
GFR calc non Af Amer: 70 mL/min — ABNORMAL LOW (ref >90)
GLUCOSE: 100 mg/dL — AB (ref 70–99)
Potassium: 4.2 mmol/L (ref 3.5–5.1)
Sodium: 140 mmol/L (ref 135–145)

## 2014-07-28 LAB — CBC
HEMATOCRIT: 33.4 % — AB (ref 36.0–46.0)
Hemoglobin: 9.8 g/dL — ABNORMAL LOW (ref 12.0–15.0)
MCH: 22.4 pg — AB (ref 26.0–34.0)
MCHC: 29.3 g/dL — AB (ref 30.0–36.0)
MCV: 76.4 fL — AB (ref 78.0–100.0)
Platelets: 248 10*3/uL (ref 150–400)
RBC: 4.37 MIL/uL (ref 3.87–5.11)
RDW: 17.9 % — ABNORMAL HIGH (ref 11.5–15.5)
WBC: 4.9 10*3/uL (ref 4.0–10.5)

## 2014-07-28 MED ORDER — ALBUTEROL SULFATE HFA 108 (90 BASE) MCG/ACT IN AERS: 1.0000 | INHALATION_SPRAY | RESPIRATORY_TRACT | Status: DC | PRN

## 2014-07-28 MED ORDER — LEVOFLOXACIN 750 MG PO TABS: 750.0000 mg | ORAL_TABLET | Freq: Every day | ORAL | Status: DC

## 2014-07-28 MED ORDER — ZOLPIDEM TARTRATE 10 MG PO TABS: 5.0000 mg | ORAL_TABLET | Freq: Every evening | ORAL | Status: DC | PRN

## 2014-07-28 MED ORDER — ENSURE COMPLETE PO LIQD: 237.0000 mL | Freq: Three times a day (TID) | ORAL | Status: DC

## 2014-07-28 MED ORDER — DIAZEPAM 10 MG PO TABS: 10.0000 mg | ORAL_TABLET | Freq: Four times a day (QID) | ORAL | Status: DC | PRN

## 2014-07-28 NOTE — Progress Notes (Signed)
PT Cancellation Note  Patient Details Name: Lynn Butler MRN: [id number] DOB: 1946-07-02   Cancelled Treatment:    Reason Eval/Treat Not Completed: Patient declined, no reason specified (Ptexpecting to discharge home today)   [name] 07/28/2014, 12:14 PM   [name], PT MS Acute Rehab Dept. Number: [phone]

## 2014-07-28 NOTE — Progress Notes (Signed)
CSW spoke to patient and then contacted her daughter Nori Riis via telephone this afternoon to discuss PT's recommendation for short term SNF. Patient declines SNF and states she wants to go home with family. Per daughter- this is the plan for her mother and she feels she and her fiance can manage patient at home.  She currently has [facility] 2 times a week and would like to continue if not increase HH services.  Daughter requests a transfer chair and a hospital bed for patient is possible prior to d/c.  Patient's current bed and chair are very old and are broken.  CSW notified RNCM- Crystal Hutchinson for follow up. Will plan d/c home when medically stable per MD.  Daughter states that she will come and pick her mother up at d/c.  CSW discussed with Dr. Elsworth Soho who stated that tentative d/c would be tomorrow.  CSW will sign off as no CSW needs identified.  Lorie Phenix. Pauline Good, [place]

## 2014-07-28 NOTE — Discharge Summary (Signed)
Physician Discharge Summary  Patient ID: Lynn Butler MRN: [id number] DOB/AGE: 67/23/1947 67 y.o.  Admit date: 07/21/2014 Discharge date: 07/28/2014    Discharge Diagnoses:  Principal Problem:   Acute on chronic respiratory failure with hypoxemia Active Problems:   Pneumonia   COPD mixed type   CAD (coronary artery disease)   CHF (congestive heart failure)   Shock, septic and/or cardiogenic   PVD (peripheral vascular disease)   Leg ulcer   Skin lesion of face   Adult failure to thrive   Claudication of both lower extremities   Smoker   Septic shock   Protein-calorie malnutrition, severe    Brief Summary: Lynn Butler is a 68 y.o. y/o female with a PMH of chronic pain on high dose narcotics, COPD, CHF, failure to thrive initially presented 12/23 to [facility] with increasing malaise, fatigue, fever, cough and progressive dyspnea x several days.  She worsened requiring intubation in ER at [facility], was significantly hypotensive requiring pressors despite 4L fluid.  She was tx to [facility] 12/23 for further treatment of acute on chronic respiratory failure r/t LLL CAP c/b underlying COPD with septic shock. Likely some degree of increased hypercarbia r/t oversedation in setting significant narcs at home, [place], etc. She was treated with IV abx, nebulized BD, vent support and gentle volume. She was extubated 12/24. Course was c/b acute kidney injury r/t septic shock, now resolved.  Pt is much improved, back to baseline respiratory status.  She remains fairly decondited but has refused recommendations for SNF and will d/c home with her daughter.  Will f/u as outpt with PCP, appointment arranged as new patient with [facility] ambulatory care clinic, see below.    Consults:  Lines/tubes: ETT 12/23 >> 12/24 Lt IJ CVL 12/23 >> 12/25  Microbiology/Sepsis markers: Sputum 12/23>>> normal flora  MRSA PCR>>>POS  ABX:   - Levaquin 12/23 >>> - Vanc 12/24 >>>12/29  Significant Diagnostic  Studies:  2D echo 12/26>>> EF 35-40%, severe hypokinesis, mod reduced systolic function, grade 1 diastolic dysfunction, PA press 32 mmHg                                                                    D/c Plan by Discharge Diagnosis  Acute on chronic respiratory failure r/t CAP, COPD and probable oversedation in setting multiple narcotics.  Hx COPD  Tobacco abuse  CAP  D/c plan --  Continuous O2 as previous  Cont home advair, spiriva, PRN albuterol  SMOKING CESSATION!  Pulmonary hygiene  Levaquin to complete total 8 days abx F/u CXR as outpt  Consider outpt pulmonary f/u with PFT's- pt prefers to see Int Med first   Home Health  Avoid oversedation -- change home ambien and valium to PRN   Septic Shock r/t PNA - resolved  HTN  CAD  PAD  Acute mixed CHF  D/c plan --  Resume outpt statin, plavix  Resume home coreg  Consider ARB - hold for now with AKI this admit and soft BP  Resume home lasix 70m daily  F/u outpt chemistries   Chronic pain  D/c plan --  Cont previous oxycontin  Change valium to PRN  Change ambien to PRN and decrease to 67m   Verrucous lesion on forehead Plan: Will need outpt  f/u with dermatology   Filed Vitals:   07/27/14 1507 07/27/14 1955 07/27/14 2157 07/28/14 0514  BP: 92/58  109/59 116/56  Pulse:   83 70  Temp:   98.9 F (37.2 C) 97.7 F (36.5 C)  TempSrc:   Oral Oral  Resp:   18 18  Height:      Weight:    102 lb 15.3 oz (46.7 kg)  SpO2:  95% 96% 100%     Discharge Labs  BMET  Recent Labs Lab 07/22/14 0330 07/23/14 0455 07/24/14 0350 07/26/14 0546 07/28/14 0728  NA 134* 136 137 139 140  K 3.4* 3.9 4.4 4.1 4.2  CL 104 105 103 99 98  CO2 23 28 28  33* 35*  GLUCOSE 120* 93 83 91 100*  BUN 20 9 6 6 11   CREATININE 1.31* 0.80 0.73 0.68 0.84  CALCIUM 7.2* 7.4* 7.5* 8.1* 8.6  MG 1.6 1.8  --   --   --   PHOS 3.1  --   --   --   --      CBC   Recent Labs Lab 07/24/14 0350 07/26/14 0546 07/28/14 0728  HGB 8.8*  9.1* 9.8*  HCT 29.5* 30.3* 33.4*  WBC 8.4 4.7 4.9  PLT 187 185 248   Anti-Coagulation No results for input(s): INR in the last 168 hours.   Discharge Instructions    Call MD for:  difficulty breathing, headache or visual disturbances    Complete by:  As directed      Call MD for:  extreme fatigue    Complete by:  As directed      Call MD for:  persistant dizziness or light-headedness    Complete by:  As directed      Call MD for:  temperature >100.4    Complete by:  As directed      Diet - low sodium heart healthy    Complete by:  As directed      Increase activity slowly    Complete by:  As directed                Follow-up Information    Follow up with [facility].   Why:  Registered nurse and Physical Therapy services to start within 24-48 hours of hospital discharge   Contact information:   [place] [address] [phone]       Follow up with [facility].   Why:  Transport chair wheel chair; Hospital Bed to be delivered post hospital discharge   Contact information:   [address] [place] [address] [phone]       Follow up with [facility]  On 08/05/2014.   Why:  2:40pm  -- 3rd floor Internal Medicine Clinic    Contact information:     Address: [address], [place], [place] [address]  [phone]      Follow up with Jannet Mantis, MD. Schedule an appointment as soon as possible for a visit in 2 weeks.   Specialty:  Dermatology   Why:  [phone]   Contact information:   [facility] [address] [place]  [address] [phone]          Medication List    STOP taking these medications        atorvastatin 80 MG tablet  Commonly known as:  LIPITOR  enalapril 5 MG tablet  Commonly known as:  VASOTEC      TAKE these medications        ADVAIR DISKUS 250-50 MCG/DOSE Aepb   Generic drug:  Fluticasone-Salmeterol  Inhale 1 puff into the lungs 2 (two) times daily.     albuterol 108 (90 BASE) MCG/ACT inhaler  Commonly known as:  PROAIR HFA  Inhale 1-2 puffs into the lungs every 4 (four) hours as needed for wheezing or shortness of breath.     COREG 3.125 MG tablet  Generic drug:  carvedilol  Take 3.125 mg by mouth 2 (two) times daily.     diazepam 10 MG tablet  Commonly known as:  VALIUM  Take 1 tablet (10 mg total) by mouth every 6 (six) hours as needed for anxiety.     feeding supplement (ENSURE COMPLETE) Liqd  Take 237 mLs by mouth 3 (three) times daily between meals.     furosemide 20 MG tablet  Commonly known as:  LASIX  Take 20 mg by mouth daily as needed.     HYDROmorphone 8 MG tablet  Commonly known as:  DILAUDID  Take 8 mg by mouth every 4 (four) hours.     levofloxacin 750 MG tablet  Commonly known as:  LEVAQUIN  Take 1 tablet (750 mg total) by mouth daily.     nitroGLYCERIN 0.4 MG SL tablet  Commonly known as:  NITROSTAT  0.4 mg.     OXYCONTIN 80 mg T12a 12 hr tablet  Generic drug:  OxyCODONE  Take 160 mg by mouth every 12 (twelve) hours.     PLAVIX 75 MG tablet  Generic drug:  clopidogrel  Take 75 mg by mouth daily.     promethazine 25 MG tablet  Commonly known as:  PHENERGAN  Take 25 mg by mouth.     simvastatin 80 MG tablet  Commonly known as:  ZOCOR  Take 80 mg by mouth at bedtime.     SPIRIVA HANDIHALER 18 MCG inhalation capsule  Generic drug:  tiotropium  Place 1 capsule into inhaler and inhale daily.     vitamin C 500 MG tablet  Commonly known as:  ASCORBIC ACID  Take 500 mg by mouth daily.     zolpidem 10 MG tablet  Commonly known as:  AMBIEN  Take 0.5 tablets (5 mg total) by mouth at bedtime as needed for sleep.          Disposition: Home with home health   Discharged Condition: Tiaira Arambula has met maximum benefit of inpatient care and is medically stable and cleared for discharge.  Patient is  pending follow up as above.      Time spent on disposition:  Greater than 35 minutes.   SignedDarlina Sicilian, NP 07/28/2014  10:26 AM Pager: [phone] or [phone]   Physician Statement:   The Patient was personally examined, the discharge assessment and plan has been personally reviewed and I agree with ACNP 's assessment and plan. > 30 minutes of time have been dedicated to discharge assessment, planning and discharge instructions.    Rigoberto Noel MD

## 2014-07-30 ENCOUNTER — Ambulatory Visit: Payer: Self-pay | Admitting: Internal Medicine

## 2014-08-03 ENCOUNTER — Inpatient Hospital Stay: Payer: Self-pay | Admitting: Surgery

## 2014-08-03 LAB — COMPREHENSIVE METABOLIC PANEL
ALK PHOS: 118 U/L — AB
ALT: 15 U/L
Albumin: 2.9 g/dL — ABNORMAL LOW (ref 3.4–5.0)
Anion Gap: 10 (ref 7–16)
BILIRUBIN TOTAL: 0.4 mg/dL (ref 0.2–1.0)
BUN: 20 mg/dL — ABNORMAL HIGH (ref 7–18)
Calcium, Total: 9 mg/dL (ref 8.5–10.1)
Chloride: 99 mmol/L (ref 98–107)
Co2: 25 mmol/L (ref 21–32)
Creatinine: 1.01 mg/dL (ref 0.60–1.30)
EGFR (African American): >60
GFR CALC NON AF AMER: 58 — AB
Glucose: 186 mg/dL — ABNORMAL HIGH (ref 65–99)
Osmolality: 276 (ref 275–301)
Potassium: 3.2 mmol/L — ABNORMAL LOW (ref 3.5–5.1)
SGOT(AST): 13 U/L — ABNORMAL LOW (ref 15–37)
SODIUM: 134 mmol/L — AB (ref 136–145)
Total Protein: 6.9 g/dL (ref 6.4–8.2)

## 2014-08-03 LAB — URINALYSIS, COMPLETE
Bacteria: NONE SEEN
Bilirubin,UR: NEGATIVE
Blood: NEGATIVE
Glucose,UR: NEGATIVE mg/dL (ref 0–75)
Ketone: NEGATIVE
LEUKOCYTE ESTERASE: NEGATIVE
Nitrite: NEGATIVE
PROTEIN: NEGATIVE
Ph: 6 (ref 4.5–8.0)
Specific Gravity: 1.029 (ref 1.003–1.030)
Squamous Epithelial: NONE SEEN
WBC UR: 2 /HPF (ref 0–5)

## 2014-08-03 LAB — CBC
HCT: 44.2 % (ref 35.0–47.0)
HGB: 13.2 g/dL (ref 12.0–16.0)
MCH: 21.8 pg — ABNORMAL LOW (ref 26.0–34.0)
MCHC: 29.9 g/dL — ABNORMAL LOW (ref 32.0–36.0)
MCV: 73 fL — AB (ref 80–100)
Platelet: 638 10*3/uL — ABNORMAL HIGH (ref 150–440)
RBC: 6.06 10*6/uL — AB (ref 3.80–5.20)
RDW: 19.4 % — ABNORMAL HIGH (ref 11.5–14.5)
WBC: 34.6 10*3/uL — AB (ref 3.6–11.0)

## 2014-08-03 LAB — TROPONIN I: Troponin-I: <0.02 ng/mL

## 2014-08-03 LAB — CK TOTAL AND CKMB (NOT AT ARMC)
CK, TOTAL: 18 U/L — AB (ref 26–192)
CK-MB: 3.4 ng/mL (ref 0.5–3.6)

## 2014-08-04 ENCOUNTER — Other Ambulatory Visit: Payer: Self-pay | Admitting: Physician Assistant

## 2014-08-04 DIAGNOSIS — I2510 Atherosclerotic heart disease of native coronary artery without angina pectoris: Secondary | ICD-10-CM

## 2014-08-04 DIAGNOSIS — I255 Ischemic cardiomyopathy: Secondary | ICD-10-CM

## 2014-08-04 DIAGNOSIS — Z01810 Encounter for preprocedural cardiovascular examination: Secondary | ICD-10-CM

## 2014-08-04 LAB — CBC WITH DIFFERENTIAL/PLATELET
Bands: 1 %
HCT: 34.7 % — ABNORMAL LOW (ref 35.0–47.0)
HGB: 10.6 g/dL — ABNORMAL LOW (ref 12.0–16.0)
Lymphocytes: 13 %
MCH: 22.6 pg — ABNORMAL LOW (ref 26.0–34.0)
MCHC: 30.7 g/dL — AB (ref 32.0–36.0)
MCV: 74 fL — ABNORMAL LOW (ref 80–100)
Monocytes: 6 %
Platelet: 477 10*3/uL — ABNORMAL HIGH (ref 150–440)
RBC: 4.7 10*6/uL (ref 3.80–5.20)
RDW: 20.1 % — AB (ref 11.5–14.5)
SEGMENTED NEUTROPHILS: 80 %
WBC: 35.9 10*3/uL — AB (ref 3.6–11.0)

## 2014-08-04 LAB — LIPID PANEL
CHOLESTEROL: 128 mg/dL (ref 0–200)
HDL Cholesterol: 54 mg/dL (ref 40–60)
Ldl Cholesterol, Calc: 63 mg/dL (ref 0–100)
Triglycerides: 56 mg/dL (ref 0–200)
VLDL Cholesterol, Calc: 11 mg/dL (ref 5–40)

## 2014-08-04 LAB — BASIC METABOLIC PANEL
ANION GAP: 7 (ref 7–16)
BUN: 15 mg/dL (ref 7–18)
CO2: 25 mmol/L (ref 21–32)
Calcium, Total: 7.7 mg/dL — ABNORMAL LOW (ref 8.5–10.1)
Chloride: 103 mmol/L (ref 98–107)
Creatinine: 0.97 mg/dL (ref 0.60–1.30)
EGFR (African American): >60
Glucose: 137 mg/dL — ABNORMAL HIGH (ref 65–99)
Osmolality: 273 (ref 275–301)
Potassium: 3.9 mmol/L (ref 3.5–5.1)
Sodium: 135 mmol/L — ABNORMAL LOW (ref 136–145)

## 2014-08-04 LAB — PHOSPHORUS: Phosphorus: 4 mg/dL (ref 2.5–4.9)

## 2014-08-04 LAB — MAGNESIUM: MAGNESIUM: 1.8 mg/dL

## 2014-08-05 ENCOUNTER — Encounter: Payer: Self-pay | Admitting: Physician Assistant

## 2014-08-05 LAB — CBC WITH DIFFERENTIAL/PLATELET
BASOS ABS: 0.1 10*3/uL (ref 0.0–0.1)
BASOS PCT: 0.5 %
EOS ABS: 0 10*3/uL (ref 0.0–0.7)
Eosinophil %: 0 %
HCT: 33.6 % — ABNORMAL LOW (ref 35.0–47.0)
HGB: 10.3 g/dL — AB (ref 12.0–16.0)
LYMPHS ABS: 1.4 10*3/uL (ref 1.0–3.6)
LYMPHS PCT: 7 %
MCH: 22.7 pg — AB (ref 26.0–34.0)
MCHC: 30.6 g/dL — AB (ref 32.0–36.0)
MCV: 74 fL — AB (ref 80–100)
Monocyte #: 1.6 x10 3/mm — ABNORMAL HIGH (ref 0.2–0.9)
Monocyte %: 7.8 %
NEUTROS ABS: 17.2 10*3/uL — AB (ref 1.4–6.5)
Neutrophil %: 84.7 %
Platelet: 348 10*3/uL (ref 150–440)
RBC: 4.54 10*6/uL (ref 3.80–5.20)
RDW: 20.3 % — AB (ref 11.5–14.5)
WBC: 20.3 10*3/uL — ABNORMAL HIGH (ref 3.6–11.0)

## 2014-08-05 LAB — PHOSPHORUS: Phosphorus: 1.9 mg/dL — ABNORMAL LOW (ref 2.5–4.9)

## 2014-08-06 LAB — BASIC METABOLIC PANEL
Anion Gap: 5 — ABNORMAL LOW (ref 7–16)
BUN: 13 mg/dL (ref 7–18)
CHLORIDE: 100 mmol/L (ref 98–107)
CO2: 32 mmol/L (ref 21–32)
Calcium, Total: 7.9 mg/dL — ABNORMAL LOW (ref 8.5–10.1)
Creatinine: 0.68 mg/dL (ref 0.60–1.30)
EGFR (African American): >60
EGFR (Non-African Amer.): >60
Glucose: 104 mg/dL — ABNORMAL HIGH (ref 65–99)
Osmolality: 274 (ref 275–301)
Potassium: 3.4 mmol/L — ABNORMAL LOW (ref 3.5–5.1)
Sodium: 137 mmol/L (ref 136–145)

## 2014-08-06 LAB — CBC WITH DIFFERENTIAL/PLATELET
BASOS PCT: 0.4 %
Basophil #: 0 10*3/uL (ref 0.0–0.1)
EOS PCT: 0.5 %
Eosinophil #: 0.1 10*3/uL (ref 0.0–0.7)
HCT: 27.9 % — ABNORMAL LOW (ref 35.0–47.0)
HGB: 8.6 g/dL — AB (ref 12.0–16.0)
LYMPHS ABS: 1.6 10*3/uL (ref 1.0–3.6)
Lymphocyte %: 14 %
MCH: 22.6 pg — ABNORMAL LOW (ref 26.0–34.0)
MCHC: 30.8 g/dL — ABNORMAL LOW (ref 32.0–36.0)
MCV: 74 fL — AB (ref 80–100)
Monocyte #: 1.2 x10 3/mm — ABNORMAL HIGH (ref 0.2–0.9)
Monocyte %: 11 %
Neutrophil #: 8.2 10*3/uL — ABNORMAL HIGH (ref 1.4–6.5)
Neutrophil %: 74.1 %
Platelet: 335 10*3/uL (ref 150–440)
RBC: 3.79 10*6/uL — AB (ref 3.80–5.20)
RDW: 20.2 % — ABNORMAL HIGH (ref 11.5–14.5)
WBC: 11.1 10*3/uL — AB (ref 3.6–11.0)

## 2014-08-06 LAB — PHOSPHORUS: PHOSPHORUS: 2.8 mg/dL (ref 2.5–4.9)

## 2014-08-06 LAB — MAGNESIUM: Magnesium: 1.8 mg/dL

## 2014-08-07 LAB — BASIC METABOLIC PANEL
Anion Gap: 7 (ref 7–16)
BUN: 15 mg/dL (ref 7–18)
CHLORIDE: 99 mmol/L (ref 98–107)
CREATININE: 0.68 mg/dL (ref 0.60–1.30)
Calcium, Total: 8.1 mg/dL — ABNORMAL LOW (ref 8.5–10.1)
Co2: 32 mmol/L (ref 21–32)
EGFR (African American): >60
GLUCOSE: 104 mg/dL — AB (ref 65–99)
Osmolality: 277 (ref 275–301)
POTASSIUM: 3.4 mmol/L — AB (ref 3.5–5.1)
SODIUM: 138 mmol/L (ref 136–145)

## 2014-08-07 LAB — PHOSPHORUS: Phosphorus: 3.1 mg/dL (ref 2.5–4.9)

## 2014-08-07 LAB — MAGNESIUM: Magnesium: 1.8 mg/dL

## 2014-08-08 LAB — MAGNESIUM: MAGNESIUM: 1.9 mg/dL

## 2014-08-08 LAB — BASIC METABOLIC PANEL
Anion Gap: 4 — ABNORMAL LOW (ref 7–16)
BUN: 16 mg/dL (ref 7–18)
CALCIUM: 8.1 mg/dL — AB (ref 8.5–10.1)
CREATININE: 0.75 mg/dL (ref 0.60–1.30)
Chloride: 100 mmol/L (ref 98–107)
Co2: 33 mmol/L — ABNORMAL HIGH (ref 21–32)
EGFR (Non-African Amer.): >60
GLUCOSE: 74 mg/dL (ref 65–99)
Osmolality: 274 (ref 275–301)
POTASSIUM: 4.8 mmol/L (ref 3.5–5.1)
SODIUM: 137 mmol/L (ref 136–145)

## 2014-08-08 LAB — CULTURE, BLOOD (SINGLE)

## 2014-08-08 LAB — PHOSPHORUS: Phosphorus: 3.6 mg/dL (ref 2.5–4.9)

## 2014-08-09 LAB — MAGNESIUM: MAGNESIUM: 1.9 mg/dL

## 2014-08-09 LAB — BASIC METABOLIC PANEL
Anion Gap: 4 — ABNORMAL LOW (ref 7–16)
BUN: 20 mg/dL — ABNORMAL HIGH (ref 7–18)
Calcium, Total: 8.2 mg/dL — ABNORMAL LOW (ref 8.5–10.1)
Chloride: 100 mmol/L (ref 98–107)
Co2: 33 mmol/L — ABNORMAL HIGH (ref 21–32)
Creatinine: 0.8 mg/dL (ref 0.60–1.30)
EGFR (African American): >60
EGFR (Non-African Amer.): >60
GLUCOSE: 87 mg/dL (ref 65–99)
Osmolality: 276 (ref 275–301)
Potassium: 4.7 mmol/L (ref 3.5–5.1)
Sodium: 137 mmol/L (ref 136–145)

## 2014-08-09 LAB — PHOSPHORUS: PHOSPHORUS: 3.6 mg/dL (ref 2.5–4.9)

## 2014-08-10 LAB — BASIC METABOLIC PANEL
ANION GAP: 4 — AB (ref 7–16)
BUN: 26 mg/dL — AB (ref 7–18)
Calcium, Total: 8.5 mg/dL (ref 8.5–10.1)
Chloride: 100 mmol/L (ref 98–107)
Co2: 33 mmol/L — ABNORMAL HIGH (ref 21–32)
Creatinine: 0.76 mg/dL (ref 0.60–1.30)
EGFR (African American): >60
GLUCOSE: 106 mg/dL — AB (ref 65–99)
OSMOLALITY: 279 (ref 275–301)
Potassium: 4.5 mmol/L (ref 3.5–5.1)
Sodium: 137 mmol/L (ref 136–145)

## 2014-08-10 LAB — PHOSPHORUS: Phosphorus: 3.8 mg/dL (ref 2.5–4.9)

## 2014-08-10 LAB — MAGNESIUM: MAGNESIUM: 2 mg/dL

## 2014-08-11 LAB — BASIC METABOLIC PANEL
ANION GAP: 5 — AB (ref 7–16)
BUN: 25 mg/dL — AB (ref 7–18)
CREATININE: 0.81 mg/dL (ref 0.60–1.30)
Calcium, Total: 8.1 mg/dL — ABNORMAL LOW (ref 8.5–10.1)
Chloride: 101 mmol/L (ref 98–107)
Co2: 31 mmol/L (ref 21–32)
EGFR (African American): >60
GLUCOSE: 94 mg/dL (ref 65–99)
OSMOLALITY: 278 (ref 275–301)
Potassium: 4.4 mmol/L (ref 3.5–5.1)
Sodium: 137 mmol/L (ref 136–145)

## 2014-08-11 LAB — PHOSPHORUS: Phosphorus: 4.3 mg/dL (ref 2.5–4.9)

## 2014-08-11 LAB — MAGNESIUM: MAGNESIUM: 2.1 mg/dL

## 2014-08-18 ENCOUNTER — Inpatient Hospital Stay: Payer: Self-pay | Admitting: Internal Medicine

## 2014-08-18 LAB — CBC WITH DIFFERENTIAL/PLATELET
BASOS ABS: 0 10*3/uL (ref 0.0–0.1)
Basophil %: 0.2 %
Eosinophil #: 0 10*3/uL (ref 0.0–0.7)
Eosinophil %: 0 %
HCT: 35 % (ref 35.0–47.0)
HGB: 10.6 g/dL — ABNORMAL LOW (ref 12.0–16.0)
LYMPHS ABS: 0.6 10*3/uL — AB (ref 1.0–3.6)
Lymphocyte %: 3.1 %
MCH: 22.4 pg — ABNORMAL LOW (ref 26.0–34.0)
MCHC: 30.4 g/dL — AB (ref 32.0–36.0)
MCV: 74 fL — ABNORMAL LOW (ref 80–100)
MONO ABS: 0.9 x10 3/mm (ref 0.2–0.9)
Monocyte %: 5.1 %
Neutrophil #: 16.7 10*3/uL — ABNORMAL HIGH (ref 1.4–6.5)
Neutrophil %: 91.6 %
Platelet: 300 10*3/uL (ref 150–440)
RBC: 4.76 10*6/uL (ref 3.80–5.20)
RDW: 20 % — ABNORMAL HIGH (ref 11.5–14.5)
WBC: 18.2 10*3/uL — ABNORMAL HIGH (ref 3.6–11.0)

## 2014-08-18 LAB — URINALYSIS, COMPLETE
BLOOD: NEGATIVE
Bacteria: NONE SEEN
Bilirubin,UR: NEGATIVE
Glucose,UR: NEGATIVE mg/dL (ref 0–75)
Ketone: NEGATIVE
Leukocyte Esterase: NEGATIVE
Nitrite: NEGATIVE
PH: 5 (ref 4.5–8.0)
Protein: NEGATIVE
RBC,UR: NONE SEEN /HPF (ref 0–5)
SPECIFIC GRAVITY: 1.012 (ref 1.003–1.030)
Squamous Epithelial: NONE SEEN
WBC UR: 1 /HPF (ref 0–5)

## 2014-08-18 LAB — BASIC METABOLIC PANEL
Anion Gap: 7 (ref 7–16)
BUN: 15 mg/dL (ref 7–18)
CALCIUM: 8.5 mg/dL (ref 8.5–10.1)
Chloride: 103 mmol/L (ref 98–107)
Co2: 27 mmol/L (ref 21–32)
Creatinine: 0.66 mg/dL (ref 0.60–1.30)
EGFR (Non-African Amer.): >60
Glucose: 124 mg/dL — ABNORMAL HIGH (ref 65–99)
Osmolality: 276 (ref 275–301)
POTASSIUM: 3.9 mmol/L (ref 3.5–5.1)
Sodium: 137 mmol/L (ref 136–145)

## 2014-08-19 LAB — CBC WITH DIFFERENTIAL/PLATELET
Basophil #: 0.1 10*3/uL (ref 0.0–0.1)
Basophil %: 0.4 %
EOS PCT: 0.1 %
Eosinophil #: 0 10*3/uL (ref 0.0–0.7)
HCT: 27.6 % — ABNORMAL LOW (ref 35.0–47.0)
HGB: 8.3 g/dL — ABNORMAL LOW (ref 12.0–16.0)
LYMPHS PCT: 16.1 %
Lymphocyte #: 2.4 10*3/uL (ref 1.0–3.6)
MCH: 22.3 pg — ABNORMAL LOW (ref 26.0–34.0)
MCHC: 30.3 g/dL — ABNORMAL LOW (ref 32.0–36.0)
MCV: 74 fL — AB (ref 80–100)
MONO ABS: 1.2 x10 3/mm — AB (ref 0.2–0.9)
Monocyte %: 7.9 %
NEUTROS PCT: 75.5 %
Neutrophil #: 11.2 10*3/uL — ABNORMAL HIGH (ref 1.4–6.5)
PLATELETS: 245 10*3/uL (ref 150–440)
RBC: 3.75 10*6/uL — ABNORMAL LOW (ref 3.80–5.20)
RDW: 19.4 % — ABNORMAL HIGH (ref 11.5–14.5)
WBC: 14.8 10*3/uL — ABNORMAL HIGH (ref 3.6–11.0)

## 2014-08-19 LAB — BASIC METABOLIC PANEL
Anion Gap: 8 (ref 7–16)
BUN: 9 mg/dL (ref 7–18)
CALCIUM: 7.4 mg/dL — AB (ref 8.5–10.1)
CHLORIDE: 104 mmol/L (ref 98–107)
Co2: 28 mmol/L (ref 21–32)
Creatinine: 0.68 mg/dL (ref 0.60–1.30)
EGFR (African American): >60
EGFR (Non-African Amer.): >60
Glucose: 75 mg/dL (ref 65–99)
Osmolality: 277 (ref 275–301)
POTASSIUM: 3.4 mmol/L — AB (ref 3.5–5.1)
Sodium: 140 mmol/L (ref 136–145)

## 2014-08-19 LAB — TSH: Thyroid Stimulating Horm: 0.788 u[IU]/mL

## 2014-08-19 LAB — POTASSIUM: Potassium: 4.3 mmol/L (ref 3.5–5.1)

## 2014-08-20 LAB — URINE CULTURE

## 2014-08-20 LAB — BASIC METABOLIC PANEL
ANION GAP: 3 — AB (ref 7–16)
BUN: 10 mg/dL (ref 7–18)
Calcium, Total: 8.3 mg/dL — ABNORMAL LOW (ref 8.5–10.1)
Chloride: 110 mmol/L — ABNORMAL HIGH (ref 98–107)
Co2: 29 mmol/L (ref 21–32)
Creatinine: 0.72 mg/dL (ref 0.60–1.30)
EGFR (African American): >60
EGFR (Non-African Amer.): >60
Glucose: 73 mg/dL (ref 65–99)
Osmolality: 281 (ref 275–301)
POTASSIUM: 3.9 mmol/L (ref 3.5–5.1)
Sodium: 142 mmol/L (ref 136–145)

## 2014-08-20 LAB — CBC WITH DIFFERENTIAL/PLATELET
BASOS ABS: 0 10*3/uL (ref 0.0–0.1)
Basophil %: 0.4 %
Eosinophil #: 0 10*3/uL (ref 0.0–0.7)
Eosinophil %: 0.2 %
HCT: 27.2 % — ABNORMAL LOW (ref 35.0–47.0)
HGB: 8.3 g/dL — ABNORMAL LOW (ref 12.0–16.0)
LYMPHS ABS: 1.6 10*3/uL (ref 1.0–3.6)
LYMPHS PCT: 19.7 %
MCH: 22.9 pg — ABNORMAL LOW (ref 26.0–34.0)
MCHC: 30.4 g/dL — ABNORMAL LOW (ref 32.0–36.0)
MCV: 76 fL — ABNORMAL LOW (ref 80–100)
MONOS PCT: 8.5 %
Monocyte #: 0.7 x10 3/mm (ref 0.2–0.9)
Neutrophil #: 5.7 10*3/uL (ref 1.4–6.5)
Neutrophil %: 71.2 %
PLATELETS: 222 10*3/uL (ref 150–440)
RBC: 3.6 10*6/uL — ABNORMAL LOW (ref 3.80–5.20)
RDW: 20.2 % — ABNORMAL HIGH (ref 11.5–14.5)
WBC: 8 10*3/uL (ref 3.6–11.0)

## 2014-08-21 LAB — VANCOMYCIN, TROUGH: VANCOMYCIN, TROUGH: 5 ug/mL — AB (ref 10–20)

## 2014-08-22 LAB — CBC WITH DIFFERENTIAL/PLATELET
BASOS PCT: 0.4 %
Basophil #: 0 10*3/uL (ref 0.0–0.1)
EOS ABS: 0 10*3/uL (ref 0.0–0.7)
EOS PCT: 0.5 %
HCT: 26.4 % — AB (ref 35.0–47.0)
HGB: 8 g/dL — AB (ref 12.0–16.0)
Lymphocyte #: 1.6 10*3/uL (ref 1.0–3.6)
Lymphocyte %: 25.1 %
MCH: 22.4 pg — AB (ref 26.0–34.0)
MCHC: 30.1 g/dL — AB (ref 32.0–36.0)
MCV: 74 fL — ABNORMAL LOW (ref 80–100)
MONO ABS: 0.7 x10 3/mm (ref 0.2–0.9)
Monocyte %: 9.9 %
NEUTROS ABS: 4.2 10*3/uL (ref 1.4–6.5)
Neutrophil %: 64.1 %
Platelet: 229 10*3/uL (ref 150–440)
RBC: 3.56 10*6/uL — AB (ref 3.80–5.20)
RDW: 19.2 % — ABNORMAL HIGH (ref 11.5–14.5)
WBC: 6.6 10*3/uL (ref 3.6–11.0)

## 2014-08-22 LAB — BASIC METABOLIC PANEL
Anion Gap: 6 — ABNORMAL LOW (ref 7–16)
BUN: 6 mg/dL — ABNORMAL LOW (ref 7–18)
CALCIUM: 8.5 mg/dL (ref 8.5–10.1)
CHLORIDE: 110 mmol/L — AB (ref 98–107)
CO2: 29 mmol/L (ref 21–32)
Creatinine: 0.74 mg/dL (ref 0.60–1.30)
EGFR (African American): >60
EGFR (Non-African Amer.): >60
GLUCOSE: 67 mg/dL (ref 65–99)
OSMOLALITY: 285 (ref 275–301)
POTASSIUM: 3.7 mmol/L (ref 3.5–5.1)
SODIUM: 145 mmol/L (ref 136–145)

## 2014-08-23 LAB — CULTURE, BLOOD (SINGLE)

## 2014-08-23 LAB — EXPECTORATED SPUTUM ASSESSMENT W REFEX TO RESP CULTURE

## 2014-08-30 ENCOUNTER — Ambulatory Visit: Payer: Self-pay | Admitting: Internal Medicine

## 2014-09-28 ENCOUNTER — Ambulatory Visit
Admit: 2014-09-28 | Disposition: A | Discharge status: Home / Self Care | Payer: Self-pay | Attending: Internal Medicine | Admitting: Internal Medicine

## 2014-10-09 ENCOUNTER — Inpatient Hospital Stay: Payer: Self-pay | Admitting: Internal Medicine

## 2014-10-29 ENCOUNTER — Ambulatory Visit
Admit: 2014-10-29 | Disposition: A | Discharge status: Home / Self Care | Payer: Self-pay | Attending: Internal Medicine | Admitting: Internal Medicine

## 2014-11-15 LAB — CBC WITH DIFFERENTIAL/PLATELET
BASOS PCT: 0.5 %
Basophil #: 0.1 10*3/uL (ref 0.0–0.1)
EOS ABS: 0 10*3/uL (ref 0.0–0.7)
Eosinophil %: 0 %
HCT: 40.7 % (ref 35.0–47.0)
HGB: 12.4 g/dL (ref 12.0–16.0)
Lymphocyte #: 2.7 10*3/uL (ref 1.0–3.6)
Lymphocyte %: 13.3 %
MCH: 21 pg — ABNORMAL LOW (ref 26.0–34.0)
MCHC: 30.5 g/dL — AB (ref 32.0–36.0)
MCV: 69 fL — AB (ref 80–100)
MONO ABS: 1.3 x10 3/mm — AB (ref 0.2–0.9)
Monocyte %: 6.4 %
Neutrophil #: 16.1 10*3/uL — ABNORMAL HIGH (ref 1.4–6.5)
Neutrophil %: 79.8 %
Platelet: 316 10*3/uL (ref 150–440)
RBC: 5.91 10*6/uL — ABNORMAL HIGH (ref 3.80–5.20)
RDW: 21.6 % — AB (ref 11.5–14.5)
WBC: 20.2 10*3/uL — ABNORMAL HIGH (ref 3.6–11.0)

## 2014-11-15 LAB — COMPREHENSIVE METABOLIC PANEL
ALK PHOS: 109 U/L
ALT: 14 U/L
AST: 23 U/L
Albumin: 3.3 g/dL — ABNORMAL LOW
Anion Gap: 9 (ref 7–16)
BUN: 10 mg/dL
Bilirubin,Total: 0.6 mg/dL
Calcium, Total: 9.1 mg/dL
Chloride: 95 mmol/L — ABNORMAL LOW
Co2: 31 mmol/L
Creatinine: 0.59 mg/dL
EGFR (African American): >60
Glucose: 127 mg/dL — ABNORMAL HIGH
Potassium: 3.3 mmol/L — ABNORMAL LOW
SODIUM: 135 mmol/L
TOTAL PROTEIN: 7.1 g/dL

## 2014-11-15 LAB — LIPASE, BLOOD: Lipase: 33 U/L

## 2014-11-15 LAB — TROPONIN I: Troponin-I: 0.03 ng/mL

## 2014-11-16 ENCOUNTER — Inpatient Hospital Stay
Admit: 2014-11-16 | Disposition: A | Discharge status: Home / Self Care | Payer: Self-pay | Attending: Surgery | Admitting: Surgery

## 2014-11-16 LAB — BASIC METABOLIC PANEL
Anion Gap: 8 (ref 7–16)
BUN: 11 mg/dL
Calcium, Total: 8 mg/dL — ABNORMAL LOW
Chloride: 94 mmol/L — ABNORMAL LOW
Co2: 32 mmol/L
Creatinine: 0.64 mg/dL
EGFR (African American): >60
EGFR (Non-African Amer.): >60
GLUCOSE: 116 mg/dL — AB
Potassium: 3.5 mmol/L
Sodium: 134 mmol/L — ABNORMAL LOW

## 2014-11-16 LAB — URINALYSIS, COMPLETE
BACTERIA: NONE SEEN
BILIRUBIN, UR: NEGATIVE
BLOOD: NEGATIVE
GLUCOSE, UR: NEGATIVE mg/dL (ref 0–75)
KETONE: NEGATIVE
Nitrite: NEGATIVE
Ph: 6 (ref 4.5–8.0)
Protein: 100
SPECIFIC GRAVITY: 1.033 (ref 1.003–1.030)

## 2014-11-16 LAB — CBC WITH DIFFERENTIAL/PLATELET
BASOS ABS: 0.2 10*3/uL — AB (ref 0.0–0.1)
BASOS PCT: 1.1 %
EOS ABS: 0 10*3/uL (ref 0.0–0.7)
Eosinophil %: 0.1 %
HCT: 34.6 % — ABNORMAL LOW (ref 35.0–47.0)
HGB: 10.6 g/dL — AB (ref 12.0–16.0)
Lymphocyte #: 2.4 10*3/uL (ref 1.0–3.6)
Lymphocyte %: 14.6 %
MCH: 20.8 pg — AB (ref 26.0–34.0)
MCHC: 30.5 g/dL — AB (ref 32.0–36.0)
MCV: 68 fL — ABNORMAL LOW (ref 80–100)
MONO ABS: 1.4 x10 3/mm — AB (ref 0.2–0.9)
Monocyte %: 8.4 %
NEUTROS PCT: 75.8 %
Neutrophil #: 12.2 10*3/uL — ABNORMAL HIGH (ref 1.4–6.5)
Platelet: 247 10*3/uL (ref 150–440)
RBC: 5.07 10*6/uL (ref 3.80–5.20)
RDW: 21.4 % — AB (ref 11.5–14.5)
WBC: 16.1 10*3/uL — ABNORMAL HIGH (ref 3.6–11.0)

## 2014-11-17 LAB — BASIC METABOLIC PANEL
ANION GAP: 12 (ref 7–16)
Anion Gap: 15 (ref 7–16)
BUN: 15 mg/dL
BUN: 16 mg/dL
CO2: 47 mmol/L — AB
CREATININE: 0.84 mg/dL
Calcium, Total: 8.3 mg/dL — ABNORMAL LOW
Calcium, Total: 8.2 mg/dL — ABNORMAL LOW
Chloride: 80 mmol/L — ABNORMAL LOW
Chloride: 87 mmol/L — ABNORMAL LOW
Co2: 41 mmol/L
Creatinine: 1.07 mg/dL — ABNORMAL HIGH
EGFR (African American): >60
EGFR (Non-African Amer.): 54 — ABNORMAL LOW
EGFR (Non-African Amer.): >60
GLUCOSE: 99 mg/dL
Glucose: 125 mg/dL — ABNORMAL HIGH
Potassium: 3.2 mmol/L — ABNORMAL LOW
Potassium: 4.2 mmol/L
SODIUM: 143 mmol/L
Sodium: 139 mmol/L

## 2014-11-18 LAB — CBC WITH DIFFERENTIAL/PLATELET
BASOS PCT: 0.2 %
Basophil #: 0 10*3/uL (ref 0.0–0.1)
Eosinophil #: 0 10*3/uL (ref 0.0–0.7)
Eosinophil %: 0 %
HCT: 36.2 % (ref 35.0–47.0)
HGB: 11 g/dL — AB (ref 12.0–16.0)
LYMPHS PCT: 10.1 %
Lymphocyte #: 1.1 10*3/uL (ref 1.0–3.6)
MCH: 21.2 pg — ABNORMAL LOW (ref 26.0–34.0)
MCHC: 30.3 g/dL — ABNORMAL LOW (ref 32.0–36.0)
MCV: 70 fL — ABNORMAL LOW (ref 80–100)
Monocyte #: 0.8 x10 3/mm (ref 0.2–0.9)
Monocyte %: 8.1 %
NEUTROS ABS: 8.6 10*3/uL — AB (ref 1.4–6.5)
Neutrophil %: 81.6 %
Platelet: 252 10*3/uL (ref 150–440)
RBC: 5.18 10*6/uL (ref 3.80–5.20)
RDW: 22.1 % — AB (ref 11.5–14.5)
WBC: 10.5 10*3/uL (ref 3.6–11.0)

## 2014-11-18 LAB — BASIC METABOLIC PANEL
ANION GAP: 8 (ref 7–16)
BUN: 18 mg/dL
CALCIUM: 8.7 mg/dL — AB
CHLORIDE: 90 mmol/L — AB
CO2: 45 mmol/L — AB
Creatinine: 0.82 mg/dL
GLUCOSE: 119 mg/dL — AB
POTASSIUM: 3.8 mmol/L
SODIUM: 143 mmol/L

## 2014-11-19 LAB — CBC WITH DIFFERENTIAL/PLATELET
BASOS PCT: 0.1 %
Basophil #: 0 10*3/uL (ref 0.0–0.1)
EOS PCT: 0 %
Eosinophil #: 0 10*3/uL (ref 0.0–0.7)
HCT: 39.1 % (ref 35.0–47.0)
HGB: 11.4 g/dL — AB (ref 12.0–16.0)
LYMPHS ABS: 1.3 10*3/uL (ref 1.0–3.6)
Lymphocyte %: 8.3 %
MCH: 20.7 pg — AB (ref 26.0–34.0)
MCHC: 29.2 g/dL — ABNORMAL LOW (ref 32.0–36.0)
MCV: 71 fL — AB (ref 80–100)
MONO ABS: 1.2 x10 3/mm — AB (ref 0.2–0.9)
MONOS PCT: 7.8 %
Neutrophil #: 13.2 10*3/uL — ABNORMAL HIGH (ref 1.4–6.5)
Neutrophil %: 83.8 %
PLATELETS: 245 10*3/uL (ref 150–440)
RBC: 5.52 10*6/uL — ABNORMAL HIGH (ref 3.80–5.20)
RDW: 22.2 % — ABNORMAL HIGH (ref 11.5–14.5)
WBC: 15.7 10*3/uL — ABNORMAL HIGH (ref 3.6–11.0)

## 2014-11-19 LAB — BASIC METABOLIC PANEL
Anion Gap: 10 (ref 7–16)
BUN: 28 mg/dL — ABNORMAL HIGH
CO2: 35 mmol/L — AB
Calcium, Total: 8.1 mg/dL — ABNORMAL LOW
Chloride: 95 mmol/L — ABNORMAL LOW
Creatinine: 1.07 mg/dL — ABNORMAL HIGH
EGFR (Non-African Amer.): 54 — ABNORMAL LOW
Glucose: 69 mg/dL
Potassium: 3.9 mmol/L
SODIUM: 140 mmol/L

## 2014-11-20 LAB — BASIC METABOLIC PANEL
Anion Gap: 11 (ref 7–16)
BUN: 34 mg/dL — ABNORMAL HIGH
CHLORIDE: 96 mmol/L — AB
Calcium, Total: 8 mg/dL — ABNORMAL LOW
Co2: 32 mmol/L
Creatinine: 1.02 mg/dL — ABNORMAL HIGH
EGFR (African American): >60
EGFR (Non-African Amer.): 57 — ABNORMAL LOW
Glucose: 67 mg/dL
Potassium: 4.7 mmol/L
SODIUM: 139 mmol/L

## 2014-11-20 LAB — CBC WITH DIFFERENTIAL/PLATELET
BASOS ABS: 0 10*3/uL (ref 0.0–0.1)
Basophil %: 0.2 %
EOS ABS: 0 10*3/uL (ref 0.0–0.7)
Eosinophil %: 0 %
HCT: 35.4 % (ref 35.0–47.0)
HGB: 10.6 g/dL — ABNORMAL LOW (ref 12.0–16.0)
Lymphocyte #: 1.8 10*3/uL (ref 1.0–3.6)
Lymphocyte %: 17.3 %
MCH: 21.6 pg — AB (ref 26.0–34.0)
MCHC: 29.9 g/dL — AB (ref 32.0–36.0)
MCV: 72 fL — ABNORMAL LOW (ref 80–100)
MONO ABS: 1.3 x10 3/mm — AB (ref 0.2–0.9)
Monocyte %: 12.6 %
NEUTROS PCT: 69.9 %
Neutrophil #: 7.4 10*3/uL — ABNORMAL HIGH (ref 1.4–6.5)
PLATELETS: 243 10*3/uL (ref 150–440)
RBC: 4.91 10*6/uL (ref 3.80–5.20)
RDW: 22.1 % — AB (ref 11.5–14.5)
WBC: 10.6 10*3/uL (ref 3.6–11.0)

## 2014-11-20 LAB — MAGNESIUM: Magnesium: 3.2 mg/dL — ABNORMAL HIGH

## 2014-11-21 LAB — BASIC METABOLIC PANEL
ANION GAP: 2 — AB (ref 7–16)
BUN: 33 mg/dL — ABNORMAL HIGH
CHLORIDE: 102 mmol/L
CO2: 33 mmol/L — AB
Calcium, Total: 7.7 mg/dL — ABNORMAL LOW
Creatinine: 0.76 mg/dL
EGFR (African American): >60
EGFR (Non-African Amer.): >60
GLUCOSE: 82 mg/dL
Potassium: 4.4 mmol/L
Sodium: 137 mmol/L

## 2014-11-21 LAB — CBC WITH DIFFERENTIAL/PLATELET
Basophil #: 0 10*3/uL (ref 0.0–0.1)
Basophil %: 0.1 %
EOS PCT: 0.1 %
Eosinophil #: 0 10*3/uL (ref 0.0–0.7)
HCT: 25.5 % — ABNORMAL LOW (ref 35.0–47.0)
HGB: 7.6 g/dL — ABNORMAL LOW (ref 12.0–16.0)
Lymphocyte #: 1 10*3/uL (ref 1.0–3.6)
Lymphocyte %: 19.2 %
MCH: 21.4 pg — AB (ref 26.0–34.0)
MCHC: 29.9 g/dL — ABNORMAL LOW (ref 32.0–36.0)
MCV: 72 fL — ABNORMAL LOW (ref 80–100)
Monocyte #: 0.9 x10 3/mm (ref 0.2–0.9)
Monocyte %: 15.8 %
NEUTROS PCT: 64.8 %
Neutrophil #: 3.5 10*3/uL (ref 1.4–6.5)
Platelet: 172 10*3/uL (ref 150–440)
RBC: 3.56 10*6/uL — ABNORMAL LOW (ref 3.80–5.20)
RDW: 21.6 % — ABNORMAL HIGH (ref 11.5–14.5)
WBC: 5.4 10*3/uL (ref 3.6–11.0)

## 2014-11-22 LAB — CBC WITH DIFFERENTIAL/PLATELET
BASOS PCT: 0.5 %
Basophil #: 0 10*3/uL (ref 0.0–0.1)
EOS ABS: 0 10*3/uL (ref 0.0–0.7)
EOS PCT: 0.3 %
HCT: 32.9 % — ABNORMAL LOW (ref 35.0–47.0)
HGB: 10.3 g/dL — AB (ref 12.0–16.0)
LYMPHS PCT: 20.8 %
Lymphocyte #: 1.4 10*3/uL (ref 1.0–3.6)
MCH: 22.6 pg — AB (ref 26.0–34.0)
MCHC: 31.4 g/dL — ABNORMAL LOW (ref 32.0–36.0)
MCV: 72 fL — ABNORMAL LOW (ref 80–100)
MONO ABS: 0.8 x10 3/mm (ref 0.2–0.9)
MONOS PCT: 11.4 %
NEUTROS PCT: 67 %
Neutrophil #: 4.6 10*3/uL (ref 1.4–6.5)
PLATELETS: 214 10*3/uL (ref 150–440)
RBC: 4.56 10*6/uL (ref 3.80–5.20)
RDW: 22.4 % — ABNORMAL HIGH (ref 11.5–14.5)
WBC: 6.9 10*3/uL (ref 3.6–11.0)

## 2014-11-22 LAB — BASIC METABOLIC PANEL
Anion Gap: 9 (ref 7–16)
BUN: 25 mg/dL — ABNORMAL HIGH
CALCIUM: 8.4 mg/dL — AB
CHLORIDE: 98 mmol/L — AB
CO2: 29 mmol/L
Creatinine: 0.68 mg/dL
EGFR (African American): >60
EGFR (Non-African Amer.): >60
GLUCOSE: 72 mg/dL
Potassium: 5.4 mmol/L — ABNORMAL HIGH
Sodium: 136 mmol/L

## 2014-11-22 LAB — SURGICAL PATHOLOGY

## 2014-11-23 LAB — BASIC METABOLIC PANEL
Anion Gap: 6 — ABNORMAL LOW (ref 7–16)
BUN: 20 mg/dL
CHLORIDE: 98 mmol/L — AB
CO2: 34 mmol/L — AB
Calcium, Total: 8.2 mg/dL — ABNORMAL LOW
Creatinine: 0.73 mg/dL
EGFR (African American): >60
EGFR (Non-African Amer.): >60
GLUCOSE: 92 mg/dL
Potassium: 3.4 mmol/L — ABNORMAL LOW
Sodium: 138 mmol/L

## 2014-11-23 LAB — CLOSTRIDIUM DIFFICILE(ARMC)

## 2014-11-25 LAB — BASIC METABOLIC PANEL
Anion Gap: 5 — ABNORMAL LOW (ref 7–16)
BUN: 25 mg/dL — ABNORMAL HIGH
CALCIUM: 8 mg/dL — AB
Chloride: 103 mmol/L
Co2: 31 mmol/L
Creatinine: 0.66 mg/dL
Glucose: 83 mg/dL
Potassium: 3.8 mmol/L
Sodium: 139 mmol/L

## 2014-11-26 LAB — PLATELET COUNT: Platelet: 189 10*3/uL (ref 150–440)

## 2014-11-28 NOTE — Consult Note (Signed)
Brief Consult Note: Diagnosis: right femoral artery aneurysm, left leg ASO with ulcer, SBO.   Patient was seen by consultant.   Comments: given her abdominal isues the vascualr difficulties are secondary at this time At the point that she has recovered from her laparotomy and maximized her respiratory status then further evauation of her right aneurysm and her left leg occlusive disease with ulcer can be performed.  Electronic Signatures: Hortencia Pilar (MD)  (Signed 20-Apr-16 20:33)  Authored: Brief Consult Note   Last Updated: 20-Apr-16 20:33 by Hortencia Pilar (MD)

## 2014-11-28 NOTE — Consult Note (Signed)
PATIENT NAME:  Lynn Butler, KORF MR#:  [id number] DATE OF BIRTH:  1945/12/29  DATE OF CONSULTATION:  08/03/2014  ADMITTING PHYSICIAN: Elta Guadeloupe A. Marina Gravel, MD  CONSULTING PHYSICIAN:  Gladstone Lighter, MD  PRIMARY CARE PHYSICIAN: At [place]:  Preoperative clearance.    BRIEF HISTORY: Ms. Weiler is a 69 year old cachectic Caucasian female with multiple medical problems including chronic respiratory failure secondary to COPD on 3-4 liters home oxygen, ischemic cardiomyopathy and CHF, EF of 25%, status post AICD, coronary artery disease, peripheral vascular disease, status post stents put in, ongoing smoking, chronic pain on several pain medications.  Presents to the hospital secondary to worsening abdominal pain for 5 days now. The patient with here in the Emergency Room on July 21, 2014 for sepsis, pneumonia, was critically ill and transferred over to [facility]. There, she was intubated, was on ventilator and was discharged home less than a week ago. The patient has been having nausea, abdominal pain, abdominal distention going on for 4-5 days now, worse today. She did have a bowel movement today. No flatus for a couple of days and CT of the abdomen showing dilated right side of her colon. No obstruction, possible volvulus. She is being admitted to surgical service for possible need for surgery and medical consult has been requested to clear her for surgery.   The patient states she has not had bypass surgery or stents put in recently, but gets occasional chest pain on exertion. She states the pain is under her left breast, though today the pain was mostly around her shoulders. She does experience palpitations occasionally. Has congestive heart failure, though she denies recently being treated for congestive heart failure. She always has some dyspnea on exertion, but most of her movement is limited from her pain. Most of her cardiac care was at [facility].   PAST  MEDICAL HISTORY: 1.  Coronary artery disease.  2.  Peripheral vascular disease.  3.  Chronic pain, following with pain management clinic at [facility] in [place] on narcotic pain medications.  4.  Ischemic cardiomyopathy.  5.  Systolic CHF, EF is 95%, status post AICD placement.  6.  Peripheral neuropathy.  7.  Chronic respiratory failure secondary to COPD on 3-4 liters home oxygen.  8.  Tobacco abuse disorder.   PAST SURGICAL HISTORY:  1.  Multiple stent placement in both legs.  2.  AICD placement and revised AICD placement.  3.  C-section.  4.  Appendectomy.   ALLERGIES TO MEDICATIONS: NONSTEROIDAL ANTI-INFLAMMATORY DRUGS.  CURRENT HOME MEDICATIONS:  1.  Advair 250/50 one puff b.i.d.  2.  Albuterol nebulizer 3 mL 3 times a day.  3.  Coreg 3.125 mg 1 tablet p.o. b.i.d.  4.  Diazepam 10 mg p.o. daily.  5.  Diazepam 10 mg p.o. in the morning.  6.  Diazepam 2 tablets 10 mg once a day at bedtime.  7.  Enalapril 2.5 mg p.o. daily.  8.  Lasix 20 mg p.o. daily.  9.  Hydromorphone 16 mg orally q. 4-6 hours p.r.n. for pain.  10.  MiraLAX powder 17 grams orally twice a day for constipation.  11.  Sublingual nitroglycerin 0.4 mg every 5 minutes as needed for chest pain.  12.  OxyContin 80 mg 2 tablets orally every 12 hours.  13.  Plavix 75 mg p.o. daily.  14.  ProAir inhaler 2 puffs once a day as needed for shortness of breath.  15.  Promethazine 25 mg q. 6  hours p.r.n. for nausea and vomiting.  16.  Senna 50 mg p.o. b.i.d.  17.  Spiriva HandiHaler daily.  18.  Zocor 10 mg p.o. at bedtime.  19.  Zolpidem 10 mg p.o. at bedtime.   SOCIAL HISTORY: Lives at home with her daughter and her grandkids. Continues to smoke. Down to 1/2 pack per day, used to smoke about 3 packs per day. No alcohol abuse. Gets around with a walker at this time.   FAMILY HISTORY: Significant for mother with breast cancer, dad with heart disease.   REVIEW OF SYSTEMS:   CONSTITUTIONAL: No fever, positive for  fatigue and weakness.  EYES: Positive for blurry vision. No inflammation, glaucoma, or cataracts.  EARS, NOSE, THROAT: No tinnitus, ear pain, hearing loss, epistaxis, or discharge.  RESPIRATORY: Positive for cough, wheezing, COPD.  No hemoptysis.  CARDIOVASCULAR: Positive for occasional chest pain, palpitations. Positive for orthopnea. No arrhythmias. No syncope.  GASTROINTESTINAL:  Positive for nausea, abdominal pain. No vomiting. No diarrhea. No hematemesis or melena.  GENITOURINARY:  No dysuria, hematuria, renal calculus, frequency, or incontinence.  ENDOCRINE: No polyuria, nocturia, thyroid problems, heat or cold intolerance.  HEMATOLOGY: No anemia, easy bruising or bleeding.  SKIN: No acne, rash, or lesions.  MUSCULOSKELETAL: Positive for chronic back pain, arthritis. No gout.  NEUROLOGICAL: No numbness, weakness, CVA, TIA, or seizures.  PSYCHOLOGICAL: No anxiety, insomnia, depression.   PHYSICAL EXAMINATION: VITAL SIGNS: Temperature 97.7 degrees Fahrenheit, pulse 94, respirations 20, blood pressure 163/81, pulse oximetry 93% on 3 liters.  GENERAL: Thin-appearing, well-built, well-nourished female lying in bed in moderate distress secondary to abdominal pain.  HEENT: Normocephalic, atraumatic. Pupils equal, round, reacting to light. Anicteric sclerae. Extraocular movements intact. Oropharynx is clear without erythema, mass, or exudate.  NECK: Supple. No thyromegaly, JVD, or carotid bruits. No lymphadenopathy.  LUNGS: Moving air bilaterally. Decreased bibasilar breath sounds, no wheeze or crackles.  Minimal use of accessory muscles for breathing.  CARDIOVASCULAR: S1, S2, regular rate and rhythm. No murmurs, rubs, or gallops.  ABDOMEN: Obese, distended, very tender, guarding is present. No auscultatory bowel sounds.  EXTREMITIES:  No pedal edema.  Left leg is wrapped up secondary to recent skin graft. Unable to palpate dorsalis pedis pulses.  SKIN:  No acne, rash or lesions, but there is  left forehead mass which probably is skin cancer growing in the last few months. The patient has not had a followup.  NEUROLOGIC: Able to move all 4 extremities. No focal neurologic deficits. Normal cranial nerves.  PSYCHOLOGIC:  Seems alert and oriented.   LABORATORY DATA: WBC 34.6, hemoglobin 13.3, hematocrit 44.2, platelet count 638,000.  Sodium 134, potassium 3.2, chloride 99, bicarbonate 25, BUN 20, creatinine 1.01, glucose 186,000 and calcium of 9.0. ALT 15, AST 13, alkaline phosphatase 118, total bilirubin is 0.4, and albumin of 3.9. Lactic acid is 2.5. Troponins negative. Urinalysis negative for any infection.   IMAGING:  Chest x-ray showing AICD, normal heart size.  Lungs clear with resolution of bibasilar airspace disease.  CT of the abdomen and pelvis showing dilated intrahepatic biliary duct, status post cholecystectomy, bilateral lower lobe inflammatory changes, atelectasis, and small pulmonary nodules, advanced atherosclerotic disease involving aorta and branches and marked distention of stomach and right colon and loose mesenteric attachment involving the transverse colon causing distention of the right colon. EKG is pending at this time.   IMPRESSION AND RECOMMENDATIONS:  68.  A 69 year old female with multiple medical problems including chronic respiratory failure secondary to chronic obstructive pulmonary disease, on home oxygen.  Recent admission to [facility] for pneumonia on ventilator 2 weeks ago, coronary artery disease, peripheral vascular disease, neuropathy, cardiomyopathy, ejection fraction 25%, automatic implantable cardiac defibrillator, chronic pain, admitted for volvulus/obstruction,   1. Preoperative  evaluation- high risk for major surgery, strong cardiac history especially being symptomatic. Cardiology consult requested. Continue beta blocker if she can take anything p.o., status post automatic implantable cardiac defibrillator and [facility] will see the patient.  2.   Volvulus. Likely will need surgery. Discussed with family that she is high risk. Surgical team managing. Nasogastric tube in place, empiric Rocephin and Flagyl at this time.  3. Chronic obstructive pulmonary disease on 3-4 liters O2 continue inhalers. No active wheezing. Monitored closely. Postoperative ventilator wean might be needed.  4. Coronary artery disease, ischemic cardiomyopathy, ejection fraction 25%, status post automatic implantable cardiac defibrillator, might decrease fluid rates.  5.  Chronic pain. Follows with pain management. High tolerance to pain medications.  6.  Ongoing smoking. Started on nicotine patch.   TOTAL CRITICAL CARE TIME SPENT ON CONSULTATION ON THIS PATIENT: 55 minutes.   ____________________________ Gladstone Lighter, MD rk:LT D: 08/03/2014 20:20:29 ET T: 08/03/2014 21:22:32 ET JOB#: [id number]  cc: Gladstone Lighter, MD, <Dictator> Gladstone Lighter MD ELECTRONICALLY SIGNED 08/19/2014 10:50

## 2014-11-28 NOTE — H&P (Signed)
PATIENT NAME:  Lynn Butler, Lynn Butler MR#:  [id number] DATE OF BIRTH:  June 22, 1946  DATE OF ADMISSION:  10/09/2014  REFERRING PHYSICIAN: Francene Castle, MD   PRIMARY CARE PHYSICIAN: Nonlocal.   ADMISSION DIAGNOSES: Pneumonia, acute kidney injury, and acute on chronic respiratory failure with hypoxemia.   HISTORY OF PRESENT ILLNESS: This is a 69 year-old Caucasian female who presents to Emergency Department via EMS after complaining of difficulty breathing. She was found to have oxygen saturations around 80% when EMS arrived. She received DuoNeb, albuterol, and Solu-Medrol in the field. Upon arrival to the Emergency Department, the patient was breathing more comfortably, but still mildly hypoxic. She states that she has been in coughing up green mucus for quite some time now. Her daughter states that the patient told her she was not feeling well starting yesterday; however, she admits that she has not fully recovered from her last hospital admission approximately 3 months ago that was preceded by an episode of respiratory failure. Due to the finding of persistent pneumonia as well as hypoxia and acute kidney injury on laboratory evaluation, the Emergency Department staff called for admission.   REVIEW OF SYSTEMS: The patient denies any fevers, chest pain, or shortness of breath. She admits to 1 episode of emesis, which at appears to be to have been bilious due to the staining around her chin and throat. The patient is verbal; however, she does not appear to be focused on my questions and cannot contribute much to her review of systems.   PAST MEDICAL HISTORY: Congestive heart failure, COPD, hypertension, peripheral vascular disease, chronic back pain, and history of respiratory failure.   PAST SURGICAL HISTORY: AICD placement, partial colectomy, cholecystectomy, hernia repair, and appendectomy. The patient has had a skin graft.   SOCIAL HISTORY: The patient lives with her daughter. She has in-home nursing  care 2 days a week for wound care. She smokes 6-7 cigarettes per day and has done so for the last 40 years.   FAMILY HISTORY: Coronary artery disease and various forms of cancer run throughout the family.   MEDICATIONS:  1.  Advair Diskus 250 mcg/50 mcg inhaled powder 1 puff inhaled 2 times a day.  2.  Atorvastatin 80 mg 1 tablet p.o. daily.  3.  Bacitracin ophthalmic ointment apply 2 times a day x 5 days.  4.  Bacitracin topical ointment applied to the affected area every 6 hours for 5 days.  5.  Carvedilol 3.125 mg 1 tablet p.o. b.i.d.  6.  Clopidogrel 75 mg 1 tablet p.o. daily.  7.  Diazepam 10 mg 1 tablet once a day in the morning and 2 tablets at bedtime as needed for muscle cramps.  8.  Ensure Plus 237 mL 1 can 3 times a day.  9.  Erythromycin application every 6 hours for 5 days.  10.  Hydromorphone 8 mg 1 tablet every 4 hours as needed for pain.  11.  OxyContin 80 mg extended release 2 tablets every 12 hours.  12.  ProAir high flow inhaler 90 mcg/inhalation 1 puff inhaled every 4 hours as needed for wheezing.  13.  Silver sulfadiazine 1% topical cream applied to affected area 2 times a day for 5 days.  14.  Sulfamethoxazole and trimethoprim double strength 800 mg/160 mg oral tablets 1 tablet p.o. b.i.d. for 14 days.   ALLERGIES: NONSTEROIDAL ANTI-INFLAMMATORY DRUGS.   PERTINENT LABORATORY RESULTS AND RADIOGRAPHIC FINDINGS: Serum glucose is 136, BUN 69, creatinine 3.15, serum sodium 134, potassium 4.2, chloride is 96, bicarbonate is  22, calcium is 7.6. Lactic acid 1.3. Troponin is 0.05. White blood cell count 24.4, hemoglobin is 10, hematocrit 34.4, platelet count 345,000. MCV is 71. Chest x-ray shows a new right lower lobe infiltrate, consistent with pneumonia as well as minimal atelectasis or infiltrate at the left base.   PHYSICAL EXAMINATION:  VITAL SIGNS: Temperature is 98.7, pulse 90, respirations 20, blood pressure 108/59, pulse oximetry is 92% on 3 L of oxygen via nasal  cannula.  GENERAL: The patient is awake, but very lethargic and seemingly unfocused on the examiner. She is oriented to person, place, and situation.  HEENT: Normocephalic, atraumatic. Pupils are equal, round, and reactive to light and accommodation. Extraocular movements are intact. Mucous membranes are dry.  NECK: Trachea is midline. No adenopathy. Thyroid is nonpalpable and nontender.  CHEST: Symmetric and atraumatic.  CARDIOVASCULAR: Regular rate and rhythm. Normal S1, S2. No rubs, clicks, or murmurs appreciated.  LUNGS: Some decreased breath sounds at the right base but otherwise clear. The patient has normal effort but prolonged expiratory phase. She is wearing oxygen via nasal cannula.  ABDOMEN: Positive bowel sounds. Soft. Mildly tender along a well-healed surgical midline incision. There is no hepatosplenomegaly. There is no rebound tenderness.  GENITOURINARY: Normal external female genitalia.  MUSCULOSKELETAL: I have not observed the patient's gait. She has at least 3/5 strength in her upper extremities bilaterally. This patient was not cooperative with strength testing of her lower extremities.  SKIN: Warm and dry. There are no rashes, but the patient has a 2 cm diameter circular lesion on her forehead with rolled borders and telangiectasia with an eroded center. It is somewhat painful to touch.  EXTREMITIES: No clubbing, cyanosis, or edema.  NEUROLOGIC: Cranial nerves II-XII are grossly intact.  PSYCHIATRIC: It is difficult to assess the patient's mood. Her affect is normal with the examiner when she does answer verbally. The patient does have good insight into her medical condition, but her judgment is difficult to assess.   ASSESSMENT AND PLAN: This is a 69 year old female who looks older than her stated age, who has been admitted for pneumonia, acute kidney injury, and acute on chronic respiratory failure with hypoxemia.  1.  Pneumonia. The patient has a new right lower lobe  infiltrate. Apparently, she has not clinically cleared her pneumonia since the last admission. She has received ceftriaxone and azithromycin in the Emergency Department. I will add vancomycin to cover hospital-acquired pneumonia, as the patient is clearly immunocompromised and has been in the hospital within the last 90 days. We will give supplemental oxygen as needed and wean as tolerated.  2.  Acute kidney injury. The patient's GFR is now 15. This is presumably due to dehydration, although the patient states that she had been drinking plenty of water over the last few days. In fact, she states that she has had a tremendous thirst. This may be related to malignancy, either within the kidney parenchyma itself or because the patient is hypermetabolic due to infiltrative disease. I will obtain a fractional excretion of sodium as well as renal ultrasounds. We will aggressively hydrate the patient and obtain a nephrology consult if there is not significant improvement in the patient's renal function.  3.  Acute on chronic respiratory failure with hypoxemia. The patient has underlying chronic obstructive pulmonary disease. She has received steroids and I will continue a steroid taper +/- her inhaled corticosteroids.  4.  Congestive heart failure. It is unknown what type of congestive heart failure the patient has, as I do  not have documentation of her echocardiogram. She is stable at this time however.  5.  Skin cancer. The patient likely has a basal cell carcinoma on her forehead. I am concerned that this may have metastasized not only given the appearance of the obvious skin lesion but also of her overall nutritional status and failure to thrive. When the patient is stable, we may want to obtain a bone scan or a PET scan to determine if there are any metastases.  6.  Hypertension. We will continue carvedilol.  7.  Peripheral vascular disease. Continue Plavix.  8.  Malnourishment. The patient is moderately  severe. Her BMI is 16.5. I have added caloric supplements to her diet.  9.  Deep vein thrombosis prophylaxis. Heparin.  10.  Gastrointestinal prophylaxis. None.   CODE STATUS: The patient is a full code, although I have discussed the patient's condition with her daughter who is considering changing her code status.   TIME SPENT ON ADMISSION ORDERS AND PATIENT CARE: Approximately 40 minutes.    ____________________________ Norva Riffle. Marcille Blanco, MD msd:bm D: 10/09/2014 04:39:27 ET T: 10/09/2014 05:31:29 ET JOB#: [id number]  cc: Norva Riffle. Marcille Blanco, MD, <Dictator> Norva Riffle Nakia Koble MD ELECTRONICALLY SIGNED 10/11/2014 4:27

## 2014-11-28 NOTE — Consult Note (Signed)
General Aspect Primary Cardiologist: [facility] __________________  69 year old female with history of CAD s/p multiple stents in 2005, ICM/chronic systolic CHF, V-tach s/p AICD in 2006 (lost completely to follow up until 2015), PVD/PAD with chronic wounds s/p femoral bypass, COPD, HTN, HLD, chronic back pain on narcotics, depression and anxiety who was recently admitted to [facility] at the end of December 2015 for PNA and sepsis and ultimately transfered to [facility] 2/2 critical illness. She presented to [facility] on 08/03/2014 with a 5 day history of worsening abdominal pain and was found to have a volvulus s/p exploratory laparotomy, adhesiolysis, colon resection of right colon and transverse colon, ileocolonic anastomosis, repair of recurrent ventral hernia. Cardiology was consulted 2/2 her comorbidities.  _________________  PMH: 1. CAD s/p multiple stents in 2005 2. ICM/chronic systolic CHF 3. V-tach s/p AICD in 2006 (lost completely to follow up until 2015) 4. PVD/PAD with chronic wounds s/p femoral bypass 5. COPD 6. HTN 7. HLD 8. Chronic back pain on narcotics 9. Depression 10. Anxiety __________________   Present Illness 69 year old femle with the above problem list who presented to [facility] on 1/5 with a 5 day history of worsening abdominal pain.  She has known history of CAD s/p stenting in 2005. She recently underwent Lexiscan Myoview through [facility] in 12/2013 secondary to pre-op clearance for non-healing leg ulcer that showed a large in size, severe, fixed defect involving the apical inferior, mid inferior, basal inferior, mid inferolateral and basal inferolateral segments. This was consistent with scar. Nuclear Wall Motion Findings:Post stress: Global systolic function is overall normal. The inferior wall is hypokinetic. The ejection fraction was greater than 65%. Echo was performed a this sametime that showed an EF of 15-20%, degenerative mitral valve disease, aortic sclerosis, normal right ventricular  contractile performance, implantable cardioverter defibrillator.   Recent hospitalization end of Decmber at [facility] and transfer to [facility] for PNA and sepsis. She was critically ill and intubated. She was discharged less than 1 week ago, per discharge summary in stable condition. The patient has been having nausea, abdominal pain, abdominal distention going on for 4-5 days now, worse today. She did have a bowel movement today. No flatus for a couple of days and CT of the abdomen showing dilated right side of her colon. No obstruction, possible volvulus. She underwent successful exploratory laparotomy, adhesiolysis, colon resection of right colon and transverse colon, ileocolonic anastomosis, repair of recurrent ventral herni on the evening of 08/03/2014. Vitals are stable. She is alert in the room. Troponin negative x 1.   Physical Exam:  GEN cachectic, thin, critically ill appearing   HEENT hearing intact to voice, dry oral mucosa   NECK supple   RESP intubated   CARD Regular rate and rhythm  Murmur   ABD positive tenderness  soft   LYMPH negative neck   EXTR negative edema   SKIN normal to palpation   NEURO cranial nerves intact   PSYCH alert   Review of Systems:  Subjective/Chief Complaint relatively nonverbal, was just extubated   ROS Pt not able to provide ROS   Medications/Allergies Reviewed Medications/Allergies reviewed   Family & Social History:  Family and Social History:  Family History Coronary Artery Disease   Social History negative ETOH, negative Illicit drugs   + Tobacco Current (within 1 year)   Place of Living Home     CAD:    Nerve damage R and L legs:    Shingles:    Hernia:  Anxiety:    HTN:    CHF:    Emphysema:    MRSA within past 6 months:    COPD -- End-stage:    Cholecystectomy:    Pacemaker / Defibrillator:          Admit Diagnosis:   ACUTE ABDOMINAL PAIN: Onset Date: 04-Aug-2014, Status: Active, Description: ACUTE  ABDOMINAL PAIN  Home Medications: Medication Instructions Status  Zocor 10 mg oral tablet 1 tab(s) orally once a day (at bedtime) Active  furosemide 20 mg oral tablet 1 tab(s) orally once a day Active  enalapril 5 mg oral tablet 0.5 tab(s) orally once a day Active  carvedilol 3.125 mg oral tablet 1 tab(s) orally 2 times a day Active  Plavix 75 mg oral tablet 1 tab(s) orally once a day Active  Senna 50 milligram(s) orally 2 times a day Active  Advair Diskus 250 mcg-50 mcg inhalation powder 1 puff(s) inhaled 2 times a day Active  Spiriva 18 mcg inhalation capsule 1 cap(s) inhaled once a day Active  OxyCONTIN 80 mg oral tablet, extended release 2 tab(s) orally every 12 hours Active  HYDROmorphone 8 mg oral tablet 2 tab(s) orally every 4 to 6 hours Active  zolpidem 10 mg oral tablet 1 tab(s) orally once a day (at bedtime) Active  diazepam 10 mg oral tablet 1 tab(s) orally once a day (in the morning) Active  diazepam 10 mg oral tablet 1 tab(s) orally once a day (in the evening) Active  diazepam 10 mg oral tablet 2 tab(s) orally once a day (at bedtime) Active  promethazine 25 mg oral tablet 1 tab(s) orally every 6 hours, As Needed - for Nausea, Vomiting Active  Nitrostat 0.4 mg sublingual tablet 1 tab(s) sublingual every 5 minutes, As Needed - for Chest Pain Active  ProAir HFA CFC free 90 mcg/inh inhalation aerosol 2 puff(s) inhaled once a day, As Needed - for Shortness of Breath Active  albuterol 2.5 mg/3 mL (0.083%) inhalation solution 3 milliliter(s) inhaled 3 times a day Active  MiraLax - oral powder for reconstitution 17 gram(s) orally 2 times a day Active   Lab Results:  Routine Micro:  05-Jan-16 15:03   Micro Text Report BLOOD CULTURE   COMMENT                   NO GROWTH IN 8-12 HOURS   ANTIBIOTIC                       Culture Comment NO GROWTH IN 8-12 HOURS  Result(s) reported on 03 Aug 2014 at 11:00PM.  Routine Chem:  05-Jan-16 14:13   Creatinine (comp) 1.01  Potassium, Serum   3.2  06-Jan-16 05:20   Cholesterol, Serum 128  Triglycerides, Serum 56  HDL (INHOUSE) 54  VLDL Cholesterol Calculated 11  LDL Cholesterol Calculated 63 (Result(s) reported on 04 Aug 2014 at 09:27AM.)  Glucose, Serum  137  BUN 15  Creatinine (comp) 0.97  Sodium, Serum  135  Potassium, Serum 3.9  Chloride, Serum 103  CO2, Serum 25  Calcium (Total), Serum  7.7  Anion Gap 7  Osmolality (calc) 273  eGFR (African American) >60  eGFR (Non-African American) >60 (eGFR values <78m/min/1.73 m2 may be an indication of chronic kidney disease (CKD). Calculated eGFR, using the MRDR Study equation, is useful in  patients with stable renal function. The eGFR calculation will not be reliable in acutely ill patients when serum creatinine is changing rapidly. It is not useful in patients  on dialysis. The eGFR calculation may not be applicable to patients at the low and high extremes of body sizes, pregnant women, and vegetarians.)  Cardiac:  05-Jan-16 14:13   Troponin I < 0.02 (0.00-0.05 0.05 ng/mL or less: NEGATIVE  Repeat testing in 3-6 hrs  if clinically indicated. >0.05 ng/mL: POTENTIAL  MYOCARDIAL INJURY. Repeat  testing in 3-6 hrs if  clinically indicated. NOTE: An increase or decrease  of 30% or more on serial  testing suggests a  clinically important change)  Routine Hem:  05-Jan-16 14:13   Hemoglobin (CBC) 13.2  Hematocrit (CBC) 44.2  06-Jan-16 05:20   WBC (CBC)  35.9  RBC (CBC) 4.70  Hemoglobin (CBC)  10.6  Hematocrit (CBC)  34.7  Platelet Count (CBC)  477 (Result(s) reported on 04 Aug 2014 at 06:36AM.)  MCV  74  MCH  22.6  MCHC  30.7  RDW  20.1  Bands 1  Segmented Neutrophils 80  Lymphocytes 13  Monocytes 6  Diff Comment 1 ANISOCYTOSIS  Diff Comment 2 POIKILOCYTOSIS  Diff Comment 3 PLTS VARIED IN SIZE  Result(s) reported on 04 Aug 2014 at 06:36AM.   EKG:  EKG Interp. by me   Interpretation EKG shows sinus tachycardia, 102 bpm, occasional PVC, nonspecific  st/t changes   Radiology Results: XRay:    05-Jan-16 14:27, Chest Portable Single View  Chest Portable Single View   REASON FOR EXAM:    Chest pain  COMMENTS:       PROCEDURE: DXR - DXR PORTABLE CHEST SINGLE VIEW  - Aug 03 2014  2:27PM     CLINICAL DATA:  Chest pain    EXAM:  PORTABLE CHEST - 1 VIEW    COMPARISON:  07/26/2014    FINDINGS:  Heart size is normal. AICD unchanged in position. Negative for heart  failure. Lungs are clear with resolution of bibasilar airspace  disease and effusion since prior study.     IMPRESSION:  No active disease.      Electronically Signed    By: Franchot Gallo M.D.    On: 08/03/2014 14:49         Verified By: Truett Perna, M.D.,  CT:    05-Jan-16 16:41, CT Abdomen and Pelvis With Contrast  CT Abdomen and Pelvis With Contrast   REASON FOR EXAM:    (1) hx of hernia, abd pain; (2) hx of hernia, abd pain  COMMENTS:       PROCEDURE: CT  - CT ABDOMEN / PELVIS  W  - Aug 03 2014  4:41PM     CLINICAL DATA:  Abdominal and chest pain today.    EXAM:  CT ABDOMEN AND PELVIS WITH CONTRAST    TECHNIQUE:  Multidetector CT imaging of the abdomen and pelvis was performed  using the standard protocol following bolus administration of  intravenous contrast.  CONTRAST:  75 cc Isovue-300    COMPARISON:  None.    FINDINGS:  Lower chest: The lung bases demonstrate patchy areas atelectasis,  mild peribronchial thickening and scattered pulmonary nodules.    Hepatobiliary: Mild intra and extrahepatic biliary dilatation likely  due to prior cholecystectomy. There is also a dilated cystic duct  remnant. No worrisome hepatic lesions.    Pancreas: The pancreatic duct is upper limits of normal in caliber.  No mass or acute inflammation.  Spleen: Normal size.  No focal lesions.    Adrenals/Urinary Tract: The adrenal glands are normal. There are  bilateral renal calculi and bilateral renal scarring  changes but no  mass or  hydronephrosis.    Stomach/Bowel: The stomach is moderately distended with contrast.  The duodenum and small bowel are grossly normal. There is marked  distention of the right colon. I do not see an obvious cecal  volvulus. It could be a stricture the transverse colon. No  pneumatosis or free air. The coronal images demonstrate swirling of  the mesenteric vessels and bowel which may be contributing to  transverse colonobstruction.    Vascular/Lymphatic: No mesenteric or retroperitoneal mass or  adenopathy. There are advanced atherosclerotic calcifications  involving the aorta and branch vessels.    Reproductive: The uterus and ovaries are grossly normal. The bladder  appears normal. The rectum and sigmoid colon are unremarkable. No  pelvic mass or adenopathy. No inguinal adenopathy. 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 Recommend vascular surgery consultation.    Other: Small anterior abdominal wall hernia containing fat and  vessels.    Musculoskeletal: Advanced degenerative changes involving the spine  but no acute bony findings.   IMPRESSION:  1. Status postcholecystectomy with intra and extrahepatic biliary  dilatation. Recommend correlation with liver function studies.  2. Bilateral lower lobe inflammatory changes, bronchitis,  atelectasis and multiple small pulmonary nodules. Recommend  dedicated chest CT for further evaluation.  3. 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  4. Marked distention of the stomach and right colon. I do not see  any definite findings for a gastric outlet obstruction but there are  swirling vessels in the mid abdomen suggesting a loose mesenteric  attachment which may be involving the transverse colon and causing  marked distention of the right colon.    Electronically Signed    By: Kalman Jewels  M.D.    On: 08/03/2014 17:19         Verified By: Marlane Hatcher, M.D.,    NSAIDS: GI Distress  Vital Signs/Nurse's Notes: **Vital Signs.:   06-Jan-16 03:00  Vital Signs Type Routine  Pulse Pulse 96  Pulse source if not from Vital Sign Device per cardiac monitor  Respirations Respirations 21  Systolic BP Systolic BP 95  Diastolic BP (mmHg) Diastolic BP (mmHg) 48  Mean BP 63  BP Source  if not from Vital Sign Device arterial  Pulse Ox % Pulse Ox % 98  Pulse Ox Activity Level  At rest  Oxygen Delivery Ventilator Assisted    Impression 69 year old female with history of CAD s/p multiple stents in 2005, ICM/chronic systolic CHF, V-tach s/p AICD in 2006 (lost completely to follow up until 2015), PVD/PAD with chronic wounds s/p femoral bypass, COPD, HTN, HLD, chronic back pain on narcotics, depression and anxiety who was recently admitted to [facility] at the end of December 2015 for PNA and sepsis and ultimately transfered to [facility] 2/2 critical illness. She presented to [facility] on 08/03/2014 with a 5 day history of worsening abdominal pain and was found to have a volvulus s/p exploratory laparotomy, adhesiolysis, colon resection of right colon and transverse colon, ileocolonic anastomosis, repair of recurrent ventral hernia. Cardiology was consulted 2/2 her comorbidities.   1. Cardiac clearance: -Patient is at high estimated risk of adverse outcome of non-cardiac surgery.  11%  estimated rate of MI, PE, V-fib, cardiac arrest, CHB, or arrhythmia -Will have to monitor post op closely  minimize IVF once tolerating po diet, rate  current at 75 ml/hr -Recent stress test 12/2013 with inferior wall HK  2. ICM: -EF 15-20% -Will have to limit fluids in the post op state, high risk of volume overload and cardiac arrhythmia -Heart Rates running in the 90s this morning with pressures in the 140s/90s -  add low dose Coreg 3.125 mg bid -Add low dose lisinopril 2.5 mg daily via NGT  3. CAD: -Recent  nuclear stress test as above -Check FLP -Meds per above  4. History of V-tach s/p ICD 2006, lost to follow up until 2015: -Biotronik Itrevia 7 VR-T, implanted 01/22/14 at [facility] -Follow up with [facility]  5. Status post exploratory laparotomy, adhesiolysis, colon resection of right colon and transverse colon, ileocolonic anastomosis, repair of recurrent ventral hernia: -High risk for cardiopulmonary collaspe and death -Limit fluids as above -Per surgery  6. COPD: -Stable -Per IM  7. Chronic pain: -Will require outpatient follow up   Electronic Signatures: Rise Mu (PA-C)  (Signed 06-Jan-16 08:44)  Authored: General Aspect/Present Illness, History and Physical Exam, Review of System, Family & Social History, Past Medical History, Home Medications, Labs, EKG , Radiology, Allergies, Vital Signs/Nurse's Notes, Impression/Plan Ida Rogue (MD)  (Signed 06-Jan-16 09:38)  Authored: General Aspect/Present Illness, History and Physical Exam, Review of System, Family & Social History, Health Issues, Labs, EKG , Impression/Plan  Co-Signer: General Aspect/Present Illness, History and Physical Exam, Review of System, Family & Social History, Past Medical History, Home Medications, Labs, EKG , Radiology, Allergies, Vital Signs/Nurse's Notes, Impression/Plan   Last Updated: 06-Jan-16 09:38 by Ida Rogue (MD)

## 2014-11-28 NOTE — Op Note (Signed)
PATIENT NAME:  Lynn Butler, NEGASH MR#:  [id number] DATE OF BIRTH:  03-20-1946  DATE OF PROCEDURE:  11/18/2014  PREOPERATIVE DIAGNOSIS:  Complete small bowel obstruction, adhesive.   POSTOPERATIVE DIAGNOSIS: Complete small bowel obstruction, adhesive.  PROCEDURE PERFORMED: Exploratory laparotomy with lysis of adhesions, release of small bowel obstruction.   SURGEON:  Sherri Rad, M.D.   ASSISTANT:  Nestor Lewandowsky, M.D.   TYPE OF ANESTHESIA:  General endotracheal.   FINDINGS:  There were two areas of tight adhesive bands from the prior right colectomy anastomosis down to the root of the mesentery.  This demonstrated a complete small bowel obstruction.  There is photo documentation obtained in the chart.   SPECIMENS:  None.   ESTIMATED BLOOD LOSS:  None.   DESCRIPTION OF PROCEDURE:  After informed consent, supine position, and general endotracheal anesthesia Foley catheter was placed.  The patient's abdomen was widely and sterilely prepped and draped utilizing ChloraPrep solution.  A timeout was observed.  The abdomen was entered through midline incision above the prior scar with sharp dissection through the fascial and peritoneal layer.  The incision was lengthened.  Immediately, largely dilated loops of small bowel became evident.  Small bowel was run proximally to the ligament of Treitz which was markedly distended.  Running distally, I found completely decompressed small bowel loops. In the mid to distal jejunum was a very tight 3 mm wide and rather long fibrous band from the prior anastomosis down to the root of the mesentery which was divided between hemostats and #0-Vicryl ties. I revealed further downstream another adhesive band causing twisting of a second segment of intestine which was not dilated.  This was also divided and straightened.  At this point, the bowel was run in its entirety from the ligament of Treitz down to the anastomosis and no further adhesive bands were identified.  Small  bowel was returned into its anatomical position.  Lap and needle count correct x 2.   The midline fascia was reapproximated from the extremes utilizing running #1 PDS suture.  Prior to closure, 2 pieces of Seprafilm were placed.  Sutures were then tied.  Subcutaneous tissues were then irrigated.  Skin edges were reapproximated utilizing a skin stapler.  Sterile dressing was applied.  The patient was then subsequently extubated and taken to the recovery room in stable and satisfactory condition by anesthesia services.    ____________________________ Jeannette How Marina Gravel, MD Andy.Dose D: 11/18/2014 17:19:36 ET T: 11/18/2014 17:38:45 ET JOB#: [id number]  cc: Elta Guadeloupe A. Marina Gravel, MD, <Dictator> Hortencia Conradi MD ELECTRONICALLY SIGNED 11/24/2014 12:50

## 2014-11-28 NOTE — H&P (Signed)
PATIENT NAME:  Lynn Butler, Lynn Butler MR#:  [id number] DATE OF BIRTH:  June 02, 1946  DATE OF ADMISSION:  11/16/2014  CHIEF COMPLAINT: Nausea and vomiting.   HISTORY OF PRESENT ILLNESS: This is a patient with a complex medical history and recent multiple admissions who presents with 2 days of abdominal pain, nausea, vomiting. She has been passing some gas but has not had a bowel movement in 2 days. She states her pain is mild to moderate and states that it is not like her pain that she had when she came to the hospital in January, which necessitated an emergency exploratory laparotomy for an internal hernia with bowel obstruction. I participated as her primary surgeon at that time.   The patient denies shortness of breath but has had multiple readmissions for pneumonia and COPD exacerbations, and she continues to smoke.  She denies melena or hematochezia, has not had any hematemesis, denies fevers or chills.   Of note, she has a chronic pain syndrome and has a contract with the pain clinic but requests Dilaudid as her preferred drug over morphine, and did so in my office recently.   Her pain is sharp but occasionally crampy. It radiates through to her back.   PAST MEDICAL HISTORY: COPD, tobacco abuse, recent pneumonias, coronary artery disease, oxygen dependence, hypertension.   PAST SURGICAL HISTORY: AICD placement, cholecystectomy, recent exploratory laparotomy and bowel resection for internal hernia with ischemic bowel, peripheral vascular disease, a history of acute renal failure.  She also has a history of CHF and recent MRSA.   MEDICATIONS: Multiple, see reconciliation, including Plavix and multiple oral medications.  FAMILY HISTORY: There is a history of coronary artery disease and smoking.   SOCIAL HISTORY: The patient continues to smoke tobacco products, does not drink alcohol.  REVIEW OF SYSTEMS: A complete system review was performed and negative with the exception of that mentioned in  the HPI. She denies dysuria or recent weight loss and is not actively short of breath at this time.   PHYSICAL EXAMINATION:  GENERAL: A thin, cachectic-appearing female patient. Weight of 95 pounds. BMI of 16.  VITAL SIGNS: Demonstrated a temperature of 98.9, a pulse of 90, respirations of 23, blood pressure 160/80, 98% saturation on supplemental oxygen. Of note, she has a pain scale of 9 to 10 as recorded by the nurses, but on my questioning it is not a 9 or 10 and she states mild to moderate (of note, she had a 9 or 10 the day that she had her acute abdomen in January but states this time that this is not as severe as what she had in January).  HEENT: Shows no scleral icterus. No palpable neck nodes. 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  CHEST: Shows bilateral rhonchi and wheezes. There is a left-sided AICD in place.  CARDIAC: Regular rate and rhythm.  ABDOMEN: Distended, tympanitic. There is a long midline scar, a questionable periumbilical recurrent ventral hernia, which is soft, nontender, and partially reducible without overlying skin changes. Abdomen is minimally tender without peritoneal signs. Bilateral groins demonstrate no abnormality of the left groin. 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 Essentially nontender. No femoral hernia is felt at the time of this exam.  EXTREMITIES: Show minimal to no edema, but muscle wasting is present in both upper and lower extremities.  INTEGUMENT: Shows no jaundice.  NEUROLOGIC: Grossly intact. The patient is awake and oriented and  alert.  IMAGING: CT scan is personally reviewed, as are abdominal films showing what looks to be a partial small-bowel obstruction with distal collapse. 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 Extensive peripheral vascular disease is present with  calcifications.   White blood cell count is elevated at 20,000, hemoglobin and hematocrit of 12 and 41, and a platelet count of 316,000. Albumin of 3.3, lipase of 33, potassium of 3.3, and a creatinine of 0.6.   ASSESSMENT AND PLAN: This is a patient with multiple medical problems, multiple readmissions for chronic obstructive pulmonary disease and pneumonia. She has been at [facility], she has had her care at [facility], and she has been here at [facility] multiple times. She had a bowel obstruction with internal hernia and ischemic bowel in January with a primary anastomosis performed by this surgeon. At this point, I see signs of partial small-bowel obstruction, but no signs of an acute abdomen. She has no peritoneal signs and this is not like her prior exam. My recommendations are nasogastric tube placement, which has already been done by the Emergency Room physician, who I spoke to personally. My plan would be to admit the patient to the hospital, re-examine with followup abdominal films and nasogastric decompression, and primary doctor will be consulted for management of her multiple medications due to her complexity as well as her multiple readmissions for COPD. The patient understood and agreed with this plan.   TIME SPENT: Over an hour was spent reviewing prior chart, admissions, and injuries as well as films and in direct patient care.   ____________________________ Jerrol Banana. Burt Knack, MD rec:ST D: 11/16/2014 00:58:38 ET T: 11/16/2014 01:17:24 ET JOB#: [id number]  cc: Jerrol Banana. Burt Knack, MD, <Dictator> Florene Glen MD ELECTRONICALLY SIGNED 11/16/2014 4:42

## 2014-11-28 NOTE — Consult Note (Signed)
Brief Consult Note: Diagnosis: Intestinal volvulous, CAD, Cardiomyopathy with EF 25%, status post AICD, COPD on 3-4L home o2, chronic pain, PVD, Tobacco use.   Patient was seen by consultant.   Consult note dictated.   Recommend further assessment or treatment.   Comments: 69y/o cachetic female with multiple medical problems including COPD on 3-4l home o2, recent adm to [facility] for pneumonia on ventilator 2 weeks ago, CAD, PVD, neuropathy, AICD, cardiomyopathy EF 25%, chronic pain admitted for abd pain adn noted to have volulous/obstruction  * Pre op eval- high risk for a major surgery, strong cardiac history- with occasional chest pain adn palpitations cardiac consult ordered continue beta blocker if can tolerate status post AICD  * Volvulous- likely will need surgery, discussed with family that she is high risk surgical team managing empiric flagyl started by surgical team- might beenfit from broader coverage like zosyn if risk of perforation is high  * COPD- 3-4l o2, continue inh, not wheezing now monitor closely, post op vent wean might be tough  * CAD, ischemic cardiomyopathy, EF 25% status post ACID- might decrease fluids rate  * Chronic pain- follows with pain management, high tolerance to pain  meds  * Ongoing smoking- nicotine patch.  Electronic Signatures: Gladstone Lighter (MD)  (Signed 05-Jan-16 20:06)  Authored: Brief Consult Note   Last Updated: 05-Jan-16 20:06 by Gladstone Lighter (MD)
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
## 2014-11-28 NOTE — Consult Note (Signed)
   Comments   I met with pt in the presence of her daughter and ex-husband. Discussed the options of sgy including vent-dependence post-op vs no sgy with symptom management through hospice. Pt is leaning toward no sgy but daughter seems to be encouraging her to have sgy. Again, both agree that pt does not want to be longterm on vent.  and family will discuss options. Will follow up.   Electronic Signatures: Saray Capasso, Izora Gala (MD)  (Signed 20-Apr-16 11:24)  Authored: Palliative Care   Last Updated: 20-Apr-16 11:24 by Althea Backs, Izora Gala (MD)

## 2014-11-28 NOTE — Discharge Summary (Signed)
PATIENT NAME:  Lynn Butler, Lynn Butler MR#:  [id number] DATE OF BIRTH:  08-09-1945  DATE OF ADMISSION:  08/03/2014 DATE OF DISCHARGE:  08/13/2014   DIAGNOSES:  1.  Internal hernia with ischemic bowel. 2.  Coronary artery disease, 3.  Peripheral vascular disease. 4.  Chronic pain syndrome. 5.  Ischemic cardiomyopathy. 6.  Peripheral neuropathy. 7.  Tobacco abuse. 8.  Chronic obstructive pulmonary disease.   CONSULTANTS: [facility].   PROCEDURES: Exploratory laparotomy with adhesiolysis and colon resection right and transverse colon with ileocolonic anastomosis and repair of recurrent ventral hernia.    HISTORY OF PRESENT ILLNESS AND HOSPITAL COURSE: This is a patient who was admitted to the hospital by Dr. Marina Gravel, presented with abdominal pain and a work-up suggesting internal hernia.  She was taken to the Operating Room where ischemic colon was identified due to an internal hernia around adhesions.  A transverse and right colon resection was performed, ileocolonic anastomosis was performed.  Postoperatively, she did well.  Her comorbidities held her back somewhat, but she is discharged in stable condition on oral analgesics and all of her regular medications; see reconciliation.  She will follow up in our office in 10 days and is instructed concerning wound care and showering.     ____________________________ Jerrol Banana. Burt Knack, MD rec:DT D: 08/23/2014 08:13:14 ET T: 08/23/2014 08:57:20 ET JOB#: [id number]  cc: Jerrol Banana. Burt Knack, MD, <Dictator> Florene Glen MD ELECTRONICALLY SIGNED 08/23/2014 18:59

## 2014-11-28 NOTE — Op Note (Signed)
PATIENT NAME:  Lynn Butler, Lynn Butler MR#:  [id number] DATE OF BIRTH:  [birth year]  DATE OF PROCEDURE:  08/03/2014  PREOPERATIVE DIAGNOSIS:  Acute abdomen.   POSTOPERATIVE DIAGNOSIS:  Bowel obstruction with necrotic transverse colon and ischemic right colon, recurrent ventral hernia with incarceration.   PROCEDURE:  Exploratory laparotomy, adhesiolysis, colon resection of right colon and transverse colon, ileocolonic anastomosis, repair of recurrent ventral hernia.   SURGEON:  Szymon Foiles E. Burt Knack, MD   ANESTHESIA:  General with endotracheal tube.   INDICATIONS:  This is a patient with an obvious acute abdomen on physical exam and findings suggestive of a dilated right colon of unclear etiology, possible volvulus. Preoperatively, we discussed the rationale for surgery, the options of observation, the risks of bleeding and infection, the risk of untreated acute abdomen, the risk of transfusion, bowel resection with ostomy either temporary or permanent, and the high risk that this patient with multiple medical problems including COPD and cardiac disease would require long-term ventilation and the potential for death was reviewed with her and her family. They understood and agreed to proceed, requesting surgery this evening emergently.   FINDINGS:  Omentum incarcerated in a small recurrent ventral hernia in the periumbilical and infraumbilical area. Primary repair sutures of probable Prolene were identified in this area. This was not involved in the pathology that brought the patient to the operating room.   The transverse colon was frankly necrotic, and there was a volvulus around the mesentery involving an adhesion from omentum to the root of the mesentery. The frankly necrotic transverse colon was mostly redundant colon to the right of the middle colic vessels. The right colon was frankly ischemic but not necrotic, and after reduction or rotation of the volvulus and adhesiolysis, the right colon did not  improve in its patchy ischemia.   DESCRIPTION OF PROCEDURE:  The patient was induced to general anesthesia. She was properly identified and prepped and draped in a sterile fashion. A midline incision was utilized to open and explore the abdominal cavity after she was prepped and draped. Opening the abdomen demonstrated a large loop of frankly necrotic bowel lying beneath the incision. Elevation of this frankly necrotic bowel revealed that it was transverse colon to the right of the middle colic vessels. The redundant transverse and right colon were quite mobile. Reduction of the volvulus resulted in terminal ileum being identified first in the right upper quadrant and then placed in the right lower quadrant. The terminal ileum was viable and not ischemic. The site of transition from frank necrosis to normal bowel in the transverse colon was divided with GIA staplers, and then the terminal ileum was divided between staplers as well with the GIA staple device. This was after it was determined that the right colon had not returned to a normal appearance and it was felt that it would be safer to perform a right colectomy, as well as transverse colon resection. The mesentery was divided between clamps and tied with either 0 silk or double ligations of 0 silk, and the specimen was passed off for examination.   A side-to-side anastomosis was then performed in stapled fashion. When opening the transverse colon, the serosal side of the colon appeared normal and nonischemic. The mucosa was normal and nonischemic. There was no blood in the lumen. The terminal ileum was viable as well. GIA stapler was fired followed by a TA stapler to close the opening in the small and large intestine and 3-0 silk sutures were utilized to reinforce this area  as well as to close the rent in the mesentery. The bowel was placed back into the abdominal cavity.   The small bowel was run from the ligament of Treitz to the anastomosis and found  to be completely viable. The previously reduced recurrent ventral hernia was noted (the recurrent ventral hernia measured approximately 1 cm).   The omentum that had been incarcerated was ligated with 0 silk as well to avoid any bleeding should vascular supply return to this area.   The area was irrigated with copious amounts of normal saline, which was aspirated. The anastomosis was reinspected and found to be pink and viable. The NG tube was confirmed to be in the stomach, and there was no sign of bleeding. The sponge, lap, and needle count was correct. Therefore, the wound was closed after placing Seprafilm with #1 PDS. The recurrent ventral hernia was closed with recurrent PDS as well (no mesh could be placed due to the frankly necrotic colon).   Skin staples were placed after placing Marcaine into the subcutaneous tissues, and a sterile dressing was placed.   The patient tolerated this procedure well. There were no complications. She was taken to the recovery room in stable condition to be admitted for continued care likely in the intensive care unit on a ventilator due to her severe COPD and cardiac disease. Sponge, lap, and needle count was correct.   ESTIMATED BLOOD LOSS:  100.    ____________________________ Jerrol Banana. Burt Knack, MD rec:nb D: 08/03/2014 22:55:20 ET T: 08/04/2014 04:31:45 ET JOB#: [id number]  cc: Jerrol Banana. Burt Knack, MD, <Dictator> Florene Glen MD ELECTRONICALLY SIGNED 08/04/2014 7:05

## 2014-11-28 NOTE — Consult Note (Signed)
   Comments   I received a call back from Dr Barbette Or who says he has followed patient for many years. He tells me that patient has very high tolerance of opioids requiring high doses. At one point, she even required a hydoromorphone infusion at home. However, pt has most recently been managed on the oxycontin 160mg  BID with prn oral hydromorphone, the latter which was weaned off due to noncompliance. Dr Barbette Or agrees with resumption of her long acting if patient will be able to tolerate oral meds and would recommend transdermal fentanyl if not. He also agrees with continuation of a breakthrough med and will follow her outpatient for management.  Electronic Signatures: Borders, Kirt Boys (NP)  (Signed 08-Jan-16 11:59)  Authored: Palliative Care Phifer, Izora Gala (MD)  (Signed 08-Jan-16 17:34)  Authored: Palliative Care   Last Updated: 08-Jan-16 17:34 by Phifer, Izora Gala (MD)
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
## 2014-11-28 NOTE — Consult Note (Signed)
PATIENT NAME:  Lynn Butler, HEMMINGWAY MR#:  [id number] DATE OF BIRTH:  06-09-1946  DATE OF CONSULTATION:  11/16/2014  REFERRING PHYSICIAN:     Jerrol Banana. Burt Knack, MD CONSULTING PHYSICIAN:  Juluis Mire, MD  PRIMARY CARE PRACTITIONER: Nonlocal.   REASON FOR CONSULTATION: Management of chronic medical problems.   HISTORY OF PRESENT ILLNESS: A 69 year old Caucasian female with a history of multiple medical problems including COPD, on home oxygen 3 liters; hypertension; coronary artery disease; congestive heart failure; skin cancer; recent admission with pneumonia in March 2016, who presented with complaints of nausea, vomiting, and abdominal pain, and diagnosed to have small bowel obstruction; hence, admitted to the general surgery service under the care of Dr. Phoebe Perch. Currently undergoing NG tube suction. Medical consultation was requested for management of chronic medical problems. The patient mentions she has been having some nausea, vomiting, and abdominal pain for the past few days, and did not have a bowel movement for the past 2 days, hence came to the Emergency Room for further evaluation. Denies any fever or cough. No chest pain. Chronic shortness of breath is stable on home medications which include inhalers and oxygen supplementation. The patient uses home oxygen at 3 liters per minute and is usually ambulatory at home.   PAST MEDICAL HISTORY:  1.  COPD, on home oxygen 3 liters per minute.  2.  Hypertension.  3.  Coronary artery disease.  4.  Congestive heart failure.  5.  History of skin cancer.   PAST SURGICAL HISTORY:  1.  Cholecystectomy.  2.  AICD placement.  3.  Partial colectomy.  4.  Hernia repair.  5.  Appendectomy.  6.  Skin graft.   SOCIAL HISTORY: She is single and lives with her daughter at home; has in-home nursing care for 2 days a week.  She smokes about 6 to 7 cigarettes per day. Denies any alcohol or substance abuse.   FAMILY HISTORY: Significant for  coronary artery disease and cancers, which runs in the family.   ALLERGIES: NONSTEROIDAL ANTI-INFLAMMATORY DRUGS.   HOME MEDICATIONS:  1.  Advair Diskus 250/50 mcg 1 puff 2 times a day.  2.  Albuterol/ipratropium 2.5 mg/0.5 mg per 3 mL inhalation solution every 6 hours as needed.  3.  Amlodipine 5 mg 1 tablet orally once a day.  4.  Atorvastatin 80 mg 1 tablet orally once a day.  5.  Carvedilol 3.125 mg 1 tablet orally 2 times a day.  6.  Clopidogrel 75 mg 1 tablet orally once a day.  7.  Diazepam 10 mg 1 tablet orally once a day in the morning.  8.  ProAir inhalation every 4 hours as needed for wheezing.    REVIEW OF SYSTEMS:  CONSTITUTIONAL: Negative for fever or chills. No fatigue. No generalized weakness.  EYES: Negative for blurred vision, double vision. No pain. No redness. No discharge.  EARS, NOSE, AND THROAT: Negative for tinnitus, ear pain, hearing loss, epistaxis, or nasal discharge.  RESPIRATORY: Positive for chronic shortness of breath which is stable. No wheezing. No excessive cough. No hemoptysis. No painful respiration.  CARDIOVASCULAR: Negative for chest pain, palpitations, dizziness, syncopal episodes, orthopnea, dyspnea on exertion, or pedal edema.  GASTROINTESTINAL: Positive for nausea, vomiting, and abdominal pain.  GENITOURINARY: Negative for dysuria, frequency, urgency.  ENDOCRINE: Negative for polyuria, nocturia, heat or cold intolerance.  HEMATOLOGIC AND LYMPHATIC: Negative for anemia, easy bruising, or bleeding.  INTEGUMENTARY: Positive for chronic fungating mass on the forehead which has been present for the past  many months.  MUSCULOSKELETAL: Negative for neck or back pain.  NEUROLOGICAL: Negative for focal weakness or numbness. No history of CVA or TIA.  PSYCHIATRIC: Negative for anxiety, insomnia, or depression.   PHYSICAL EXAMINATION:  VITAL SIGNS: Temperature 98.9 degrees Fahrenheit, pulse rate 98 per minute, respirations 18 per minute, blood pressure  136/89, O2 saturation is 99% on 3 liters oxygen.  GENERAL: Elderly female, thin, frail-looking, comfortable, and resting in the bed, no acute distress.  HEAD: Atraumatic, normocephalic. Fungating mass present on the forehead.  EYES: Pupils are equal, react to light and accommodation.  NOSE:  NG tube in place and hooked to suction.  EARS: No drainage.  ORAL CAVITY: No mucosal lesions.  NECK: Supple. No JVD. No thyromegaly. No carotid bruit. Range of motion is within normal limits.  RESPIRATORY: Good respiratory effort. Not using accessory muscles of respiration. Bilateral vesicular breath sounds present. No rales or rhonchi.  CARDIOVASCULAR: S1, S2 regular. No murmurs, gallops, or clicks. Pulses equal at carotid, femoral, pedal pulses. No peripheral edema.  GASTROINTESTINAL: Abdomen is soft, mild distension present. Bowel sounds hyperactive. Mild tenderness present.  JOINTS: No joint tenderness or effusion. Range of motion adequate.  SKIN: A 7 cm wide fungating lesion present over the forehead on the left side.  LYMPHATIC: No cervical lymphadenopathy.  VASCULAR: Good dorsalis pedis and posterior tibial pulses.  NEUROLOGICAL: Alert, awake, and oriented x 3. Cranial nerves II to XII grossly intact. No sensory deficit. Motor strength 5/5 in all extremities.  PSYCHIATRIC: Alert, awake, and oriented x 3. Judgment and insight are adequate. Memory and mood are within normal limits.   ANCILLARY DATA:  LABORATORY DATA: Serum glucose 127, BUN 10, creatinine 0.59, sodium 135, potassium 3.3, chloride 95, bicarbonate 31, lipase 33, calcium 9.1. LFTs within normal limits. Troponin less than 0.03. WBC 20.2, hemoglobin 12.4, hematocrit 40.7, platelet count 316,000.   Chest x-ray:  1.  Partial clearing of right lower lobe infiltrate, left basilar lung opacity resolved.  2.  Persistent mildly spiculated density in the right mid lung, suggested by radiology further evaluation after the patient's acute illness  resolves.  3.  Several mildly dilated loops of small in the lower abdomen and pelvis.   CT abdomen: Small bowel obstruction within mid abdomen   EKG: Not available for my review at this time.   ASSESSMENT AND PLAN: A 69 year old Caucasian female with a history of chronic obstructive pulmonary disease on home oxygen 3 liters, hypertension, coronary artery disease, congestive heart failure, history of skin cancer admitted to the general surgery service with small bowel obstruction. Medical consultation requested for management of chronic medical problems.  1.  Small bowel obstruction: Admitted to the care of general surgery service. The patient is on nasogastric tube suction. Continue care per general surgery.  2.  Chronic obstructive pulmonary disease on home oxygen 3 liters at home: The patient is stable. No acute respiratory problems at this time. Continue Advair, DuoNebs, oxygen supplementation.  3.  Hypertension: Stable on current home medications. Continue same.  4.  History of coronary artery disease, stable clinically: No acute cardiovascular symptoms. Monitor. Continue home medications.  5.  History of congestive heart failure in the past, well compensated: No acute problems at present. Continue home medications. Monitor.  6.  Tobacco usage, continuous. The patient not motivated to quit.  7.  Recommend potassium supplementation.   Thank you for the consultation. We will follow with you.   TIME SPENT: 40 minutes.    ____________________________ Juluis Mire,  MD enr:TT D: 11/16/2014 01:29:00 ET T: 11/16/2014 03:36:52 ET JOB#: [id number]  cc: Jerrol Banana. Burt Knack, MD Juluis Mire, MD, <Dictator> Primary Care Practitioner   Juluis Mire MD ELECTRONICALLY SIGNED 11/16/2014 19:31
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
## 2014-11-28 NOTE — H&P (Signed)
Subjective/Chief Complaint severe abdominal pain and progressive abdominal distension.   History of Present Illness 69 year old female with severe acute onset abdominal pain starting around 2 pm this past thursday which has progressed over the last 5 days to excrutiating in nature.  Last BM this am, last passage of flatus at least 2 days ago.  One episode of emesis.  No change in her chronic SOB.  She has chronic back pain for which she takes chronic narcotics.  Recently discharged form [facility] with sepsis and penumonia with a COPD exacerbation over the holidays for which she was on vasopressors and had a NGT at that time.  Workup in ER shows markedly distended right colon and apparent swirling of the mesentery in RLQ.  Chest xrays without infiltrate, no fevers, no dysuria. While at [facility] she was on ventilator and pressors.  discharged on antibiotics but no steroids.  prior to my arrival she had received 4 mg morphine and 1 mg dilaudid.   Past History Prior BLE arterial bypasses all done at [facility].  Severe COPD Severe CAD/ h/o AICD Neglected left forehead skin cancer. oxygen dependence ongoing tobacco abuse.   Past Medical Health Hypertension   Code Status Full Code   Past Med/Surgical Hx:  CAD:   Nerve damage R and L legs:   Shingles:   Hernia:   Anxiety:   HTN:   CHF:   Emphysema:   MRSA within past 6 months:   COPD -- End-stage:   Cholecystectomy:   Pacemaker / Defibrillator:   ALLERGIES:  NSAIDS: GI Distress   Other Allergies none.   HOME MEDICATIONS: Medication Instructions Status  Zocor 10 mg oral tablet 1 tab(s) orally once a day (at bedtime) Active  furosemide 20 mg oral tablet 1 tab(s) orally once a day Active  enalapril 5 mg oral tablet 0.5 tab(s) orally once a day Active  carvedilol 3.125 mg oral tablet 1 tab(s) orally 2 times a day Active  Plavix 75 mg oral tablet 1 tab(s) orally once a day Active  Senna 50 milligram(s) orally 2 times a day  Active  Advair Diskus 250 mcg-50 mcg inhalation powder 1 puff(s) inhaled 2 times a day Active  Spiriva 18 mcg inhalation capsule 1 cap(s) inhaled once a day Active  OxyCONTIN 80 mg oral tablet, extended release 2 tab(s) orally every 12 hours Active  HYDROmorphone 8 mg oral tablet 2 tab(s) orally every 4 to 6 hours Active  zolpidem 10 mg oral tablet 1 tab(s) orally once a day (at bedtime) Active  diazepam 10 mg oral tablet 1 tab(s) orally once a day (in the morning) Active  diazepam 10 mg oral tablet 1 tab(s) orally once a day (in the evening) Active  diazepam 10 mg oral tablet 2 tab(s) orally once a day (at bedtime) Active  promethazine 25 mg oral tablet 1 tab(s) orally every 6 hours, As Needed - for Nausea, Vomiting Active  Nitrostat 0.4 mg sublingual tablet 1 tab(s) sublingual every 5 minutes, As Needed - for Chest Pain Active  ProAir HFA CFC free 90 mcg/inh inhalation aerosol 2 puff(s) inhaled once a day, As Needed - for Shortness of Breath Active  albuterol 2.5 mg/3 mL (0.083%) inhalation solution 3 milliliter(s) inhaled 3 times a day Active  MiraLax - oral powder for reconstitution 17 gram(s) orally 2 times a day Active   Family and Social History:  Family History Non-Contributory  Coronary Artery Disease  Hypertension  COPD  Smoking   Social History  positive  tobacco, positive  tobacco (Current within 1 year), negative ETOH   + Tobacco Current (within 1 year)  Prior (greater than 1 year)  3 ppd for 50 years   Place of Living Home   Review of Systems:  Subjective/Chief Complaint see above and dictated H and P   Cough No    Sputum No    Abdominal Pain Yes   Nausea/Vomiting Yes   Tolerating Diet No   Medications/Allergies Reviewed Medications/Allergies reviewed   Physical Exam:  GEN cachectic, thin, disheveled, critically ill appearing, p85 bp 162/100, wt 98 lbs, BMI 16.8   HEENT pale conjunctivae, PERRL, hearing intact to voice, dry oral mucosa   NECK supple  No  masses  trachea midline   RESP normal resp effort  clear BS  no use of accessory muscles   CARD regular rate   ABD positive tenderness  positive Flank Tenderness  positive hernia  distended  significant diffuse peritonitis.  RLQ scar, small umbilical hernia, right groin pulsatile mass., bilateral groin scars.   GU foley catheter in place   LYMPH negative neck, negative axillae   EXTR lower extermity scars from prior bypass operations.   SKIN large left sided forehead skin cancer.   NEURO cranial nerves intact, negative rigidity   PSYCH alert, A+O to time, place, person, poor insight   Lab Results:  Hepatic:  05-Jan-16 14:13   Bilirubin, Total 0.4  Alkaline Phosphatase  118 (46-116 NOTE: New Reference Range 02/16/14)  SGPT (ALT) 15 (14-63 NOTE: New Reference Range 02/16/14)  SGOT (AST)  13  Total Protein, Serum 6.9  Albumin, Serum  2.9  Routine Chem:  05-Jan-16 14:13   Glucose, Serum  186  BUN  20  Creatinine (comp) 1.01  Sodium, Serum  134  Potassium, Serum  3.2  Chloride, Serum 99  CO2, Serum 25  Calcium (Total), Serum 9.0  Osmolality (calc) 276  eGFR (African American) >60  eGFR (Non-African American)  58 (eGFR values <44m/min/1.73 m2 may be an indication of chronic kidney disease (CKD). Calculated eGFR, using the MRDR Study equation, is useful in  patients with stable renal function. The eGFR calculation will not be reliable in acutely ill patients when serum creatinine is changing rapidly. It is not useful in patients on dialysis. The eGFR calculation may not be applicable to patients at the low and high extremes of body sizes, pregnant women, and vegetarians.)  Anion Gap 10  Result Comment HGB/HCT - RESULTS VERIFIED BY REPEAT TESTING.  Result(s) reported on 03 Aug 2014 at 02:34PM.  Cardiac:  05-Jan-16 14:13   CK, Total  18  CPK-MB, Serum 3.4 (Result(s) reported on 03 Aug 2014 at 02:54PM.)  Troponin I < 0.02 (0.00-0.05 0.05 ng/mL or less: NEGATIVE   Repeat testing in 3-6 hrs  if clinically indicated. >0.05 ng/mL: POTENTIAL  MYOCARDIAL INJURY. Repeat  testing in 3-6 hrs if  clinically indicated. NOTE: An increase or decrease  of 30% or more on serial  testing suggests a  clinically important change)  Routine Hem:  05-Jan-16 14:13   WBC (CBC)  34.6  RBC (CBC)  6.06  Hemoglobin (CBC) 13.2  Hematocrit (CBC) 44.2  Platelet Count (CBC)  638  MCV  73  MCH  21.8  MCHC  29.9  RDW  19.4   Radiology Results: LabUnknown:    05-Jan-16 16:41, CT Abdomen and Pelvis With Contrast  PACS Image  CT:  CT Abdomen and Pelvis With Contrast  REASON FOR EXAM:    (  1) hx of hernia, abd pain; (2) hx of hernia, abd pain  COMMENTS:       PROCEDURE: CT  - CT ABDOMEN / PELVIS  W  - Aug 03 2014  4:41PM     CLINICAL DATA:  Abdominal and chest pain today.    EXAM:  CT ABDOMEN AND PELVIS WITH CONTRAST    TECHNIQUE:  Multidetector CT imaging of the abdomen and pelvis was performed  using the standard protocol following bolus administration of  intravenous contrast.  CONTRAST:  75 cc Isovue-300    COMPARISON:  None.    FINDINGS:  Lower chest: The lung bases demonstrate patchy areas atelectasis,  mild peribronchial thickening and scattered pulmonary nodules.    Hepatobiliary: Mild intra and extrahepatic biliary dilatation likely  due to prior cholecystectomy. There is also a dilated cystic duct  remnant. No worrisome hepatic lesions.    Pancreas: The pancreatic duct is upper limits of normal in caliber.  No mass or acute inflammation.  Spleen: Normal size.  No focal lesions.    Adrenals/Urinary Tract: The adrenal glands are normal. There are  bilateral renal calculi and bilateral renal scarring changes but no  mass or hydronephrosis.    Stomach/Bowel: The stomach is moderately distended with contrast.  The duodenum and small bowel are grossly normal. There is marked  distention of the right colon. I do not see an obvious  cecal  volvulus. It could be a stricture the transverse colon. No  pneumatosis or free air. The coronal images demonstrate swirling of  the mesenteric vessels and bowel which may be contributing to  transverse colonobstruction.    Vascular/Lymphatic: No mesenteric or retroperitoneal mass or  adenopathy. There are advanced atherosclerotic calcifications  involving the aorta and branch vessels.    Reproductive: The uterus and ovaries are grossly normal. The bladder  appears normal. The rectum and sigmoid colon are unremarkable. No  pelvic mass or adenopathy. No inguinal adenopathy. 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 Recommend vascular surgery consultation.    Other: Small anterior abdominal wall hernia containing fat and  vessels.    Musculoskeletal: Advanced degenerative changes involving the spine  but no acute bony findings.   IMPRESSION:  1. Status postcholecystectomy with intra and extrahepatic biliary  dilatation. Recommend correlation with liver function studies.  2. Bilateral lower lobe inflammatory changes, bronchitis,  atelectasis and multiple small pulmonary nodules. Recommend  dedicated chest CT for further evaluation.  3. 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  4. Marked distention of the stomach and right colon. I do not see  any definite findings for a gastric outlet obstruction but there are  swirling vessels in the mid abdomen suggesting a loose mesenteric  attachment which may be involving the transverse colon and causing  marked distention of the right colon.    Electronically Signed    By: Kalman Jewels M.D.    On: 08/03/2014 17:19         Verified By: Marlane Hatcher, M.D.,    Assessment/Admission Diagnosis 69 y/o with acute abdominal pain and leukocytosis and recent critical illness requiring  pressors. Concern is for colonic volvulus vs. ischemic colitis vs acute mesenteric ischemia. Spoke frankly with her and her ex-husband regarding multiple medical co-morbidities and high risk nature of any surgical intervention.   Plan Admit, hydrate and start IV abx,  ngt and foley, pain medication.  Suspect needs laparotomy but extremely high risk for perioperative morbidity and death. Will go ahead and get admitted and re-assess following resuscitation.  45 minutes.   Electronic Signatures: Sherri Rad (MD)  (Signed 05-Jan-16 18:40)  Authored: CHIEF COMPLAINT and HISTORY, PAST MEDICAL/SURGIAL HISTORY, ALLERGIES, Other Allergies, HOME MEDICATIONS, FAMILY AND SOCIAL HISTORY, REVIEW OF SYSTEMS, PHYSICAL EXAM, LABS, Radiology, ASSESSMENT AND PLAN   Last Updated: 05-Jan-16 18:40 by Sherri Rad (MD)

## 2014-11-28 NOTE — Consult Note (Signed)
69 y/o female admitted to general surgery service with  n/v/abd pain 2/2 SBO, medical consultation requested for management of chronic medical problems. : on home o2 3 L at home. No acute problems, stable, continue home meds, o2 supplementation, incentive spirometry.stable on amlodipine, coreg, continue same, f/u bp.Hx of CAD, stable, no cp, Monitor, cont home meds.Hx of CHF, well compensated, no acute problems. cont home meds. MonitorTobacco use- counselled to quit, pt not motivated. care per general surgery.  Electronic Signatures: Azucena Freed (MD)  (Signed on 19-Apr-16 01:17)  Authored  Last Updated: 19-Apr-16 01:17 by Azucena Freed (MD)

## 2014-11-28 NOTE — Consult Note (Signed)
   Comments   I met with pt and her daughter. Updated daughter on pt's condition. We discussed code status. Pt defers decision to her daughter. Daughter wants pt to remain a full code. I discussed longterm vent dependence, eg trach/PEG/LTACH with pt. She says that she would not want this. Daughter was present for this discussion and agrees.   Electronic Signatures: Melitza Metheny, Izora Gala (MD)  (Signed 21-Jan-16 14:27)  Authored: Palliative Care   Last Updated: 21-Jan-16 14:27 by Lutisha Knoche, Izora Gala (MD)

## 2014-12-16 ENCOUNTER — Emergency Department: Payer: Medicare Other

## 2014-12-16 ENCOUNTER — Inpatient Hospital Stay
Admission: EM | Admit: 2014-12-16 | Discharge: 2014-12-19 | DRG: 191 | Disposition: A | Discharge status: Home Health | Payer: Medicare Other | Attending: Internal Medicine | Admitting: Internal Medicine

## 2014-12-16 ENCOUNTER — Encounter: Payer: Self-pay | Admitting: Emergency Medicine

## 2014-12-16 DIAGNOSIS — Z888 Allergy status to other drugs, medicaments and biological substances status: Secondary | ICD-10-CM | POA: Diagnosis not present

## 2014-12-16 DIAGNOSIS — M549 Dorsalgia, unspecified: Secondary | ICD-10-CM | POA: Diagnosis present

## 2014-12-16 DIAGNOSIS — I5022 Chronic systolic (congestive) heart failure: Secondary | ICD-10-CM | POA: Diagnosis present

## 2014-12-16 DIAGNOSIS — I10 Essential (primary) hypertension: Secondary | ICD-10-CM | POA: Diagnosis present

## 2014-12-16 DIAGNOSIS — I248 Other forms of acute ischemic heart disease: Secondary | ICD-10-CM | POA: Diagnosis present

## 2014-12-16 DIAGNOSIS — J440 Chronic obstructive pulmonary disease with acute lower respiratory infection: Secondary | ICD-10-CM | POA: Diagnosis present

## 2014-12-16 DIAGNOSIS — B9562 Methicillin resistant Staphylococcus aureus infection as the cause of diseases classified elsewhere: Secondary | ICD-10-CM | POA: Diagnosis present

## 2014-12-16 DIAGNOSIS — Z955 Presence of coronary angioplasty implant and graft: Secondary | ICD-10-CM | POA: Diagnosis not present

## 2014-12-16 DIAGNOSIS — Z7902 Long term (current) use of antithrombotics/antiplatelets: Secondary | ICD-10-CM | POA: Diagnosis not present

## 2014-12-16 DIAGNOSIS — R109 Unspecified abdominal pain: Secondary | ICD-10-CM | POA: Diagnosis present

## 2014-12-16 DIAGNOSIS — J209 Acute bronchitis, unspecified: Secondary | ICD-10-CM | POA: Diagnosis present

## 2014-12-16 DIAGNOSIS — Z95810 Presence of automatic (implantable) cardiac defibrillator: Secondary | ICD-10-CM | POA: Diagnosis not present

## 2014-12-16 DIAGNOSIS — L97529 Non-pressure chronic ulcer of other part of left foot with unspecified severity: Secondary | ICD-10-CM | POA: Diagnosis present

## 2014-12-16 DIAGNOSIS — F17210 Nicotine dependence, cigarettes, uncomplicated: Secondary | ICD-10-CM | POA: Diagnosis present

## 2014-12-16 DIAGNOSIS — M79605 Pain in left leg: Secondary | ICD-10-CM | POA: Diagnosis present

## 2014-12-16 DIAGNOSIS — E785 Hyperlipidemia, unspecified: Secondary | ICD-10-CM | POA: Diagnosis present

## 2014-12-16 DIAGNOSIS — I255 Ischemic cardiomyopathy: Secondary | ICD-10-CM | POA: Diagnosis present

## 2014-12-16 DIAGNOSIS — Z79891 Long term (current) use of opiate analgesic: Secondary | ICD-10-CM | POA: Diagnosis not present

## 2014-12-16 DIAGNOSIS — I739 Peripheral vascular disease, unspecified: Secondary | ICD-10-CM | POA: Diagnosis present

## 2014-12-16 DIAGNOSIS — I252 Old myocardial infarction: Secondary | ICD-10-CM

## 2014-12-16 DIAGNOSIS — Z79899 Other long term (current) drug therapy: Secondary | ICD-10-CM | POA: Diagnosis not present

## 2014-12-16 DIAGNOSIS — I2510 Atherosclerotic heart disease of native coronary artery without angina pectoris: Secondary | ICD-10-CM | POA: Diagnosis present

## 2014-12-16 DIAGNOSIS — C44319 Basal cell carcinoma of skin of other parts of face: Secondary | ICD-10-CM | POA: Diagnosis present

## 2014-12-16 DIAGNOSIS — E876 Hypokalemia: Secondary | ICD-10-CM | POA: Diagnosis present

## 2014-12-16 DIAGNOSIS — G894 Chronic pain syndrome: Secondary | ICD-10-CM | POA: Diagnosis present

## 2014-12-16 DIAGNOSIS — Z9981 Dependence on supplemental oxygen: Secondary | ICD-10-CM

## 2014-12-16 DIAGNOSIS — I509 Heart failure, unspecified: Secondary | ICD-10-CM | POA: Diagnosis present

## 2014-12-16 DIAGNOSIS — M79604 Pain in right leg: Secondary | ICD-10-CM | POA: Diagnosis present

## 2014-12-16 DIAGNOSIS — J441 Chronic obstructive pulmonary disease with (acute) exacerbation: Secondary | ICD-10-CM | POA: Diagnosis not present

## 2014-12-16 DIAGNOSIS — J9610 Chronic respiratory failure, unspecified whether with hypoxia or hypercapnia: Secondary | ICD-10-CM | POA: Diagnosis present

## 2014-12-16 DIAGNOSIS — J449 Chronic obstructive pulmonary disease, unspecified: Secondary | ICD-10-CM | POA: Diagnosis present

## 2014-12-16 HISTORY — DX: Old myocardial infarction: I25.2

## 2014-12-16 HISTORY — DX: Carrier or suspected carrier of methicillin resistant Staphylococcus aureus: Z22.322

## 2014-12-16 LAB — BASIC METABOLIC PANEL
ANION GAP: 10 (ref 5–15)
BUN: <5 mg/dL — ABNORMAL LOW (ref 6–20)
CALCIUM: 8.8 mg/dL — AB (ref 8.9–10.3)
CO2: 25 mmol/L (ref 22–32)
Chloride: 103 mmol/L (ref 101–111)
Creatinine, Ser: 0.57 mg/dL (ref 0.44–1.00)
GFR calc non Af Amer: >60 mL/min (ref >60)
Glucose, Bld: 109 mg/dL — ABNORMAL HIGH (ref 65–99)
Potassium: 2.6 mmol/L — CL (ref 3.5–5.1)
SODIUM: 138 mmol/L (ref 135–145)

## 2014-12-16 LAB — CBC
HEMATOCRIT: 38.6 % (ref 35.0–47.0)
HEMOGLOBIN: 12.1 g/dL (ref 12.0–16.0)
MCH: 22.4 pg — ABNORMAL LOW (ref 26.0–34.0)
MCHC: 31.3 g/dL — ABNORMAL LOW (ref 32.0–36.0)
MCV: 71.7 fL — ABNORMAL LOW (ref 80.0–100.0)
Platelets: 378 10*3/uL (ref 150–440)
RBC: 5.38 MIL/uL — AB (ref 3.80–5.20)
RDW: 22.7 % — AB (ref 11.5–14.5)
WBC: 12.8 10*3/uL — AB (ref 3.6–11.0)

## 2014-12-16 LAB — TROPONIN I
TROPONIN I: 0.07 ng/mL — AB (ref <0.031)
Troponin I: 0.06 ng/mL — ABNORMAL HIGH (ref <0.031)

## 2014-12-16 MED ORDER — POTASSIUM CHLORIDE 20 MEQ PO PACK
40.0000 meq | PACK | Freq: Once | ORAL | Status: AC
  Administered 2014-12-16: 40 meq via ORAL

## 2014-12-16 MED ORDER — ONDANSETRON HCL 4 MG PO TABS: 4.0000 mg | ORAL_TABLET | Freq: Four times a day (QID) | ORAL | Status: DC | PRN

## 2014-12-16 MED ORDER — VITAMIN C 500 MG PO TABS
500.0000 mg | ORAL_TABLET | Freq: Every day | ORAL | Status: DC
  Administered 2014-12-17 – 2014-12-19 (×3): 500 mg via ORAL
  Filled 2014-12-16 (×3): qty 1

## 2014-12-16 MED ORDER — METHYLPREDNISOLONE SODIUM SUCC 125 MG IJ SOLR
60.0000 mg | Freq: Every day | INTRAMUSCULAR | Status: DC
  Administered 2014-12-17 – 2014-12-19 (×3): 60 mg via INTRAVENOUS
  Filled 2014-12-16 (×3): qty 2

## 2014-12-16 MED ORDER — HYDROMORPHONE HCL 1 MG/ML IJ SOLN
1.0000 mg | Freq: Once | INTRAMUSCULAR | Status: AC
Start: 2014-12-16 — End: 2014-12-16
  Administered 2014-12-16: 1 mg via INTRAVENOUS

## 2014-12-16 MED ORDER — ONDANSETRON HCL 4 MG/2ML IJ SOLN
INTRAMUSCULAR | Status: AC
  Administered 2014-12-16: 4 mg via INTRAVENOUS
  Filled 2014-12-16: qty 2

## 2014-12-16 MED ORDER — METHYLPREDNISOLONE SODIUM SUCC 125 MG IJ SOLR
INTRAMUSCULAR | Status: AC
  Administered 2014-12-16: 60 mg via INTRAVENOUS
  Filled 2014-12-16: qty 2

## 2014-12-16 MED ORDER — DIAZEPAM 5 MG PO TABS
10.0000 mg | ORAL_TABLET | Freq: Four times a day (QID) | ORAL | Status: DC | PRN
  Administered 2014-12-17 – 2014-12-18 (×6): 10 mg via ORAL
  Filled 2014-12-16 (×6): qty 2

## 2014-12-16 MED ORDER — HEPARIN SODIUM (PORCINE) 5000 UNIT/ML IJ SOLN
5000.0000 [IU] | Freq: Three times a day (TID) | INTRAMUSCULAR | Status: DC
  Administered 2014-12-16 – 2014-12-19 (×9): 5000 [IU] via SUBCUTANEOUS
  Filled 2014-12-16 (×8): qty 1

## 2014-12-16 MED ORDER — CARVEDILOL 3.125 MG PO TABS
3.1250 mg | ORAL_TABLET | Freq: Two times a day (BID) | ORAL | Status: DC
Start: 2014-12-16 — End: 2014-12-19
  Administered 2014-12-17 – 2014-12-19 (×6): 3.125 mg via ORAL
  Filled 2014-12-16 (×6): qty 1

## 2014-12-16 MED ORDER — HEPARIN SODIUM (PORCINE) 5000 UNIT/ML IJ SOLN
INTRAMUSCULAR | Status: AC
  Administered 2014-12-16: 5000 [IU] via SUBCUTANEOUS
  Filled 2014-12-16: qty 1

## 2014-12-16 MED ORDER — HYDROMORPHONE HCL 1 MG/ML IJ SOLN
1.0000 mg | Freq: Once | INTRAMUSCULAR | Status: AC
  Administered 2014-12-16: 1 mg via INTRAVENOUS

## 2014-12-16 MED ORDER — CLOPIDOGREL BISULFATE 75 MG PO TABS
75.0000 mg | ORAL_TABLET | Freq: Every day | ORAL | Status: DC
  Administered 2014-12-17 – 2014-12-19 (×3): 75 mg via ORAL
  Filled 2014-12-16 (×3): qty 1

## 2014-12-16 MED ORDER — IPRATROPIUM-ALBUTEROL 0.5-2.5 (3) MG/3ML IN SOLN
3.0000 mL | Freq: Once | RESPIRATORY_TRACT | Status: AC
  Administered 2014-12-16: 3 mL via RESPIRATORY_TRACT

## 2014-12-16 MED ORDER — METHYLPREDNISOLONE SODIUM SUCC 125 MG IJ SOLR
60.0000 mg | Freq: Once | INTRAMUSCULAR | Status: AC
  Administered 2014-12-16: 60 mg via INTRAVENOUS

## 2014-12-16 MED ORDER — MOMETASONE FURO-FORMOTEROL FUM 100-5 MCG/ACT IN AERO
2.0000 | INHALATION_SPRAY | Freq: Two times a day (BID) | RESPIRATORY_TRACT | Status: DC
  Administered 2014-12-17 (×2): 2 via RESPIRATORY_TRACT
  Filled 2014-12-16: qty 8.8

## 2014-12-16 MED ORDER — TIOTROPIUM BROMIDE MONOHYDRATE 18 MCG IN CAPS
1.0000 | ORAL_CAPSULE | Freq: Every day | RESPIRATORY_TRACT | Status: DC
  Administered 2014-12-17 – 2014-12-19 (×3): 18 ug via RESPIRATORY_TRACT
  Filled 2014-12-16: qty 5

## 2014-12-16 MED ORDER — POTASSIUM CHLORIDE 20 MEQ PO PACK
PACK | ORAL | Status: AC
  Administered 2014-12-16: 40 meq via ORAL
  Filled 2014-12-16: qty 2

## 2014-12-16 MED ORDER — AMLODIPINE BESYLATE 5 MG PO TABS
5.0000 mg | ORAL_TABLET | Freq: Every day | ORAL | Status: DC
  Administered 2014-12-17 – 2014-12-19 (×3): 5 mg via ORAL
  Filled 2014-12-16 (×3): qty 1

## 2014-12-16 MED ORDER — IPRATROPIUM-ALBUTEROL 0.5-2.5 (3) MG/3ML IN SOLN
RESPIRATORY_TRACT | Status: AC
  Filled 2014-12-16: qty 3

## 2014-12-16 MED ORDER — ONDANSETRON HCL 4 MG/2ML IJ SOLN: 4.0000 mg | Freq: Four times a day (QID) | INTRAMUSCULAR | Status: DC | PRN

## 2014-12-16 MED ORDER — HYDROMORPHONE HCL 1 MG/ML IJ SOLN
INTRAMUSCULAR | Status: AC
Start: 2014-12-16 — End: 2014-12-16
  Administered 2014-12-16: 1 mg via INTRAVENOUS
  Filled 2014-12-16: qty 1

## 2014-12-16 MED ORDER — ATORVASTATIN CALCIUM 20 MG PO TABS
80.0000 mg | ORAL_TABLET | Freq: Every day | ORAL | Status: DC
  Administered 2014-12-17 – 2014-12-19 (×3): 80 mg via ORAL
  Filled 2014-12-16 (×3): qty 4

## 2014-12-16 MED ORDER — ONDANSETRON HCL 4 MG/2ML IJ SOLN
4.0000 mg | Freq: Once | INTRAMUSCULAR | Status: AC
  Administered 2014-12-16: 4 mg via INTRAVENOUS

## 2014-12-16 MED ORDER — DEXTROSE 5 % IV SOLN
250.0000 mg | Freq: Once | INTRAVENOUS | Status: AC
  Administered 2014-12-17: 250 mg via INTRAVENOUS
  Filled 2014-12-16: qty 250

## 2014-12-16 MED ORDER — SODIUM CHLORIDE 0.9 % IJ SOLN
3.0000 mL | Freq: Two times a day (BID) | INTRAMUSCULAR | Status: DC
  Administered 2014-12-16 – 2014-12-19 (×6): 3 mL via INTRAVENOUS

## 2014-12-16 MED ORDER — ACETAMINOPHEN 650 MG RE SUPP: 650.0000 mg | Freq: Four times a day (QID) | RECTAL | Status: DC | PRN

## 2014-12-16 MED ORDER — MORPHINE SULFATE 2 MG/ML IJ SOLN
2.0000 mg | INTRAMUSCULAR | Status: DC | PRN
Start: 2014-12-16 — End: 2014-12-17
  Administered 2014-12-17 (×4): 2 mg via INTRAVENOUS
  Filled 2014-12-16 (×4): qty 1

## 2014-12-16 MED ORDER — ACETAMINOPHEN 325 MG PO TABS: 650.0000 mg | ORAL_TABLET | Freq: Four times a day (QID) | ORAL | Status: DC | PRN

## 2014-12-16 MED ORDER — IPRATROPIUM-ALBUTEROL 0.5-2.5 (3) MG/3ML IN SOLN
3.0000 mL | RESPIRATORY_TRACT | Status: DC
  Administered 2014-12-17 (×2): 3 mL via RESPIRATORY_TRACT
  Filled 2014-12-16 (×2): qty 3

## 2014-12-16 MED ORDER — HYDROMORPHONE HCL 1 MG/ML IJ SOLN
INTRAMUSCULAR | Status: AC
  Administered 2014-12-16: 1 mg via INTRAVENOUS
  Filled 2014-12-16: qty 1

## 2014-12-16 MED ORDER — NITROGLYCERIN 0.4 MG SL SUBL: 0.4000 mg | SUBLINGUAL_TABLET | SUBLINGUAL | Status: DC | PRN

## 2014-12-16 MED ORDER — ZOLPIDEM TARTRATE 5 MG PO TABS
5.0000 mg | ORAL_TABLET | Freq: Every evening | ORAL | Status: DC | PRN
  Administered 2014-12-17 – 2014-12-18 (×3): 5 mg via ORAL
  Filled 2014-12-16 (×3): qty 1

## 2014-12-16 MED ORDER — ENSURE COMPLETE PO LIQD: 237.0000 mL | Freq: Three times a day (TID) | ORAL | Status: DC

## 2014-12-16 NOTE — Progress Notes (Signed)
Peak flow done, 1st breath 150L/min , 2nd breath 160L/min , 3rd breath 250 L/min Predicted measurements 415L/min

## 2014-12-16 NOTE — ED Notes (Signed)
Made MD aware of Trop and K+ critical values. Received new orders. Will complete orders promptly.

## 2014-12-16 NOTE — H&P (Signed)
[facility] [place] NAME: Lynn Butler    MR#:  [id number]  DATE OF BIRTH:  1945-09-22   DATE OF ADMISSION:  12/16/2014  PRIMARY CARE PHYSICIAN: No primary care provider on file.   REQUESTING/REFERRING PHYSICIAN: Malinda  CHIEF COMPLAINT:   Chief Complaint  Patient presents with  . Shortness of Breath    HISTORY OF PRESENT ILLNESS:  Lynn Butler  is a 69 y.o. 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 She describes one week total duration of shortness of breath which is constant, associated cough productive of white to clear sputum subjective chills however no frank fever. Denies chest pain, palpitations, edema, orthopnea. On arrival to the emergency department she was noted to be in some respiratory distress breathing in the high 20s  PAST MEDICAL HISTORY:   Past Medical History  Diagnosis Date  . COPD (chronic obstructive pulmonary disease)   . Hypertension   . Ischemic cardiomyopathy     a.   . Coronary artery disease     a. s/p multiple stenting in 2005  . History of ventricular tachycardia   . PVD (peripheral vascular disease)   . PAD (peripheral artery disease)   . HLD (hyperlipidemia)   . Chronic back pain   . MI, old   . MRSA (methicillin resistant staph aureus) culture positive     left foot wound    PAST SURGICAL HISTORY:   Past Surgical History  Procedure Laterality Date  . Insert / replace / remove pacemaker      SOCIAL HISTORY:   History  Substance Use Topics  . Smoking status: Current Every Day Smoker -- 1.00 packs/day    Types: Cigarettes  . Smokeless tobacco: Not on file  . Alcohol Use: No    FAMILY HISTORY:   Family History  Problem Relation Age of Onset  . CAD Other     DRUG ALLERGIES:   Allergies  Allergen  Reactions  . Nsaids Nausea And Vomiting and Other (See Comments)    GI distress, burning    REVIEW OF SYSTEMS:  REVIEW OF SYSTEMS:  CONSTITUTIONAL: Denies fevers, positive chills, fatigue, weakness.  EYES: Denies blurred vision, double vision, or eye pain.  EARS, NOSE, THROAT: Denies tinnitus, ear pain, hearing loss.  RESPIRATORY: Positive cough, shortness of breath, wheezing  CARDIOVASCULAR: Denies chest pain, palpitations, edema.  GASTROINTESTINAL: Denies nausea, vomiting, diarrhea, abdominal pain.  GENITOURINARY: Denies dysuria, hematuria.  ENDOCRINE: Denies nocturia or thyroid problems. HEMATOLOGIC AND LYMPHATIC: Denies easy bruising or bleeding.  SKIN: Denies rash or lesions.  MUSCULOSKELETAL: Denies pain in neck, back, shoulder, knees, hips, or further arthritic symptoms.  NEUROLOGIC: Denies paralysis, paresthesias.  PSYCHIATRIC: Denies anxiety or depressive symptoms. Otherwise full review of systems performed by me is negative.   MEDICATIONS AT HOME:   Prior to Admission medications   Medication Sig Start Date End Date Taking? Authorizing Provider  albuterol (PROAIR HFA) 108 (90 BASE) MCG/ACT inhaler Inhale 1-2 puffs into the lungs every 4 (four) hours as needed for wheezing or shortness of breath. 07/28/14   Marijean Heath, NP  amLODipine (NORVASC) 5 MG tablet Take 5 mg by mouth daily.    Historical Provider, MD  atorvastatin (LIPITOR) 80 MG tablet Take 80 mg by mouth daily.    Historical Provider, MD  carvedilol (COREG) 3.125 MG tablet  Take 3.125 mg by mouth 2 (two) times daily. 08/06/12   Historical Provider, MD  clopidogrel (PLAVIX) 75 MG tablet Take 75 mg by mouth daily. 08/06/12   Historical Provider, MD  diazepam (VALIUM) 10 MG tablet Take 1 tablet (10 mg total) by mouth every 6 (six) hours as needed for anxiety. Patient taking differently: Take 10 mg by mouth as needed for anxiety. 1 tab orally once a day (in the morning), 1 tablet in the evening, and 2 tablets as  bedtime as needed 07/28/14   Marijean Heath, NP  feeding supplement, ENSURE COMPLETE, (ENSURE COMPLETE) LIQD Take 237 mLs by mouth 3 (three) times daily between meals. 07/28/14   Marijean Heath, NP  Fluticasone-Salmeterol (ADVAIR DISKUS) 250-50 MCG/DOSE AEPB Inhale 1 puff into the lungs 2 (two) times daily. 06/03/12   Historical Provider, MD  furosemide (LASIX) 20 MG tablet Take 20 mg by mouth daily as needed. 08/06/12   Historical Provider, MD  HYDROmorphone (DILAUDID) 4 MG tablet Take 4 mg by mouth every 12 (twelve) hours as needed for moderate pain (moderate pain (4-6/10)).    Historical Provider, MD  HYDROmorphone (DILAUDID) 8 MG tablet Take 8 mg by mouth every 4 (four) hours.    Historical Provider, MD  ipratropium-albuterol (DUONEB) 0.5-2.5 (3) MG/3ML SOLN Take 3 mLs by nebulization every 6 (six) hours as needed (wheezing, shortness of breath).    Historical Provider, MD  levofloxacin (LEVAQUIN) 750 MG tablet Take 1 tablet (750 mg total) by mouth daily. 07/28/14   Marijean Heath, NP  nitroGLYCERIN (NITROSTAT) 0.4 MG SL tablet 0.4 mg. 08/06/12   Historical Provider, MD  OxyCODONE (OXYCONTIN) 80 mg T12A 12 hr tablet Take 160 mg by mouth every 12 (twelve) hours.    Historical Provider, MD  OXYCODONE HCL ER PO Take 80 mg by mouth every 8 (eight) hours.    Historical Provider, MD  promethazine (PHENERGAN) 25 MG tablet Take 25 mg by mouth. 08/06/12   Historical Provider, MD  simvastatin (ZOCOR) 80 MG tablet Take 80 mg by mouth at bedtime.    Historical Provider, MD  tiotropium (SPIRIVA HANDIHALER) 18 MCG inhalation capsule Place 1 capsule into inhaler and inhale daily. 08/06/12   Historical Provider, MD  vitamin C (ASCORBIC ACID) 500 MG tablet Take 500 mg by mouth daily.    Historical Provider, MD  zolpidem (AMBIEN) 10 MG tablet Take 0.5 tablets (5 mg total) by mouth at bedtime as needed for sleep. 07/28/14   Marijean Heath, NP      VITAL SIGNS:  Blood pressure 133/52, pulse 89,  temperature 98.1 F (36.7 C), temperature source Oral, resp. rate 23, height 5\' 4"  (1.626 m), weight 85 lb (38.556 kg), SpO2 97 %.  PHYSICAL EXAMINATION:  VITAL SIGNS: Filed Vitals:   12/16/14 2303  BP: 133/52  Pulse: 89  Temp:   Resp: 6   GENERAL:69 y.o.female currently in no acute distress chronically ill-appearing.  HEAD: Normocephalic, atraumatic.  EYES: Pupils equal, round, reactive to light. Extraocular muscles intact. No scleral icterus.  MOUTH: Moist mucosal membrane. Dentition intact. No abscess noted.  EAR, NOSE, THROAT: Clear without exudates. No external lesions.  NECK: Supple. No thyromegaly. No nodules. No JVD.  PULMONARY: Coarse breath sounds throughout without frank, wheeze rails or rhonci. No use of accessory muscles, Good respiratory effort. good air entry bilaterally CHEST: Nontender to palpation.  CARDIOVASCULAR: S1 and S2. Regular rate and rhythm. No murmurs, rubs, or gallops. No edema. Pedal pulses 2+ bilaterally.  GASTROINTESTINAL: Soft,  nontender, nondistended. No masses. Positive bowel sounds. No hepatosplenomegaly.  MUSCULOSKELETAL: No swelling, clubbing, or edema. Range of motion full in all extremities.  NEUROLOGIC: Cranial nerves II through XII are intact. No gross focal neurological deficits. Sensation intact. Reflexes intact.  SKIN: Approximately 2 x 3 cm lesion on left forehead, No further ulceration, lesions, rashes, or cyanosis. Skin warm and dry. Turgor intact.  PSYCHIATRIC: Mood, affect within normal limits. The patient is awake, alert and oriented x 3. Insight, judgment intact.    LABORATORY PANEL:   CBC  Recent Labs Lab 12/16/14 1957  WBC 12.8*  HGB 12.1  HCT 38.6  PLT 378   ------------------------------------------------------------------------------------------------------------------  Chemistries   Recent Labs Lab 12/16/14 1957  NA 138  K 2.6*  CL 103  CO2 25  GLUCOSE 109*  BUN <5*  CREATININE 0.57  CALCIUM 8.8*    ------------------------------------------------------------------------------------------------------------------  Cardiac Enzymes  Recent Labs Lab 12/16/14 1957  TROPONINI 0.06*   ------------------------------------------------------------------------------------------------------------------  RADIOLOGY:  Dg Chest 2 View (if Patient Has Fever And/or Copd)  12/16/2014   CLINICAL DATA:  Acute onset of shortness of breath. Initial encounter.  EXAM: CHEST  2 VIEW  COMPARISON:  Chest radiograph performed 11/20/2014  FINDINGS: The lungs are well-aerated. Minimal right-sided atelectasis is noted. There is no evidence of pleural effusion or pneumothorax.  The heart is normal in size; an AICD is noted at the left chest wall, with a single lead ending at the right ventricle. No acute osseous abnormalities are seen.  IMPRESSION: Minimal right-sided atelectasis noted; lungs otherwise clear.   Electronically Signed   By: Garald Balding M.D.   On: 12/16/2014 20:35    EKG:   Orders placed or performed during the hospital encounter of 12/16/14  . ED EKG  (if patient has PMH of COPD)  . ED EKG  (if patient has PMH of COPD)    IMPRESSION AND PLAN:   69 year old Caucasian female with COPD chronic respiratory failure percent with shortness of breath.  1. COPD exacerbation: Provide supplemental O2 to keep SaO2 greater than 88%, Solu-Medrol 60 mg IV daily, azithromycin, DuoNeb treatments, continue Advair/Spiriva 2. Elevated troponin: Absence of cardiac symptoms, place on telemetry trend cardiac enzymes 3. Hypokalemia: Replace potassium to go 4-5 4. Essential hypertension: Continue Norvasc, Coreg 5. Venous thromboembolism prophylactic: Heparin subcutaneous   All the records are reviewed and case discussed with ED provider. Management plans discussed with the patient, family and they are in agreement.  CODE STATUS: Full code  TOTAL TIME TAKING CARE OF THIS PATIENT: 45 minutes.    Connery Shiffler,   Karenann Cai.D on 12/16/2014 at 11:08 PM  Between 7am to 6pm - Pager - [phone]  After 6pm: House Pager: - [phone]  [facility]  Office  [phone]  CC: Primary care physician; No primary care provider on file. Petra Kuba

## 2014-12-16 NOTE — ED Notes (Signed)
RT at bedside, completing Peak Flow reading.

## 2014-12-16 NOTE — ED Notes (Signed)
Pt here from home via [facility] with c/o shortness of breath x1 week; reports worse today. Pt is on 4L of oxygen at home. EMS reports pt gave herself 2 albuterol txs today and EMS gave 1 duoneb en route. EMS also reports pt had increased anxiety and RR (42) but CO2 level is normal. Pt is alert upon arrival, no distress noted. Pt does smoke cigarettes.

## 2014-12-16 NOTE — ED Provider Notes (Signed)
[facility] Emergency Department Provider Note  ____________________________________________  Time seen: Approximately 8:40 PM  I have reviewed the triage vital signs and the nursing notes.   HISTORY  Chief Complaint Shortness of Breath    HPI Lynn Butler is a 69 y.o. female who reports she's been having increasing shortness of breath progressing over the course of the week he got worse several days ago and got worse again today but not in a stepwise fashion and gradually progressive fashion she has some pain in her chest from working hard to breathe this is her breathing pain and not anything like her cardiac pain which she has had in the past she said shortness of breath was so bad today she could not even cough and breathing at the same time he had abdominal surgery recently and is still having pain from that request pain medicine she also has MRSA and a leg ulcer on the left leg asked me not to 1 due to the dressing since she had the dressing just redone today at 5 PM patient is on 4 L oxygen at home and still smokes. Patient reports she has a cough productive of thick is clear phlegm and it seems to get up in the middle of her throat   Past Medical History  Diagnosis Date  . COPD (chronic obstructive pulmonary disease)   . Hypertension   . Ischemic cardiomyopathy     a.   . Coronary artery disease     a. s/p multiple stenting in 2005  . History of ventricular tachycardia   . PVD (peripheral vascular disease)   . PAD (peripheral artery disease)   . HLD (hyperlipidemia)   . Chronic back pain   . MI, old   . MRSA (methicillin resistant staph aureus) culture positive     left foot wound    Patient Active Problem List   Diagnosis Date Noted  . Protein-calorie malnutrition, severe 07/22/2014  . Septic shock   . Pneumonia 07/21/2014  . Acute on chronic respiratory failure with hypoxemia 07/21/2014  . COPD mixed type 07/21/2014  . CAD (coronary  artery disease) 07/21/2014  . CHF (congestive heart failure) 07/21/2014  . Shock, septic and/or cardiogenic 07/21/2014  . PVD (peripheral vascular disease) 07/21/2014  . Leg ulcer 07/21/2014  . Skin lesion of face 07/21/2014  . Adult failure to thrive 07/21/2014  . Claudication of both lower extremities 07/21/2014  . Smoker 07/21/2014    Past Surgical History  Procedure Laterality Date  . Insert / replace / remove pacemaker      Current Outpatient Rx  Name  Route  Sig  Dispense  Refill  . albuterol (PROAIR HFA) 108 (90 BASE) MCG/ACT inhaler   Inhalation   Inhale 1-2 puffs into the lungs every 4 (four) hours as needed for wheezing or shortness of breath.         Marland Kitchen amLODipine (NORVASC) 5 MG tablet   Oral   Take 5 mg by mouth daily.         Marland Kitchen atorvastatin (LIPITOR) 80 MG tablet   Oral   Take 80 mg by mouth daily.         . carvedilol (COREG) 3.125 MG tablet   Oral   Take 3.125 mg by mouth 2 (two) times daily.         . clopidogrel (PLAVIX) 75 MG tablet   Oral   Take 75 mg by mouth daily.         Marland Kitchen  diazepam (VALIUM) 10 MG tablet   Oral   Take 1 tablet (10 mg total) by mouth every 6 (six) hours as needed for anxiety. Patient taking differently: Take 10 mg by mouth as needed for anxiety. 1 tab orally once a day (in the morning), 1 tablet in the evening, and 2 tablets as bedtime as needed      0   . feeding supplement, ENSURE COMPLETE, (ENSURE COMPLETE) LIQD   Oral   Take 237 mLs by mouth 3 (three) times daily between meals.         . Fluticasone-Salmeterol (ADVAIR DISKUS) 250-50 MCG/DOSE AEPB   Inhalation   Inhale 1 puff into the lungs 2 (two) times daily.         . furosemide (LASIX) 20 MG tablet   Oral   Take 20 mg by mouth daily as needed.         Marland Kitchen HYDROmorphone (DILAUDID) 4 MG tablet   Oral   Take 4 mg by mouth every 12 (twelve) hours as needed for moderate pain (moderate pain (4-6/10)).         Marland Kitchen HYDROmorphone (DILAUDID) 8 MG tablet    Oral   Take 8 mg by mouth every 4 (four) hours.         Marland Kitchen ipratropium-albuterol (DUONEB) 0.5-2.5 (3) MG/3ML SOLN   Nebulization   Take 3 mLs by nebulization every 6 (six) hours as needed (wheezing, shortness of breath).         Marland Kitchen levofloxacin (LEVAQUIN) 750 MG tablet   Oral   Take 1 tablet (750 mg total) by mouth daily.   3 tablet   0   . nitroGLYCERIN (NITROSTAT) 0.4 MG SL tablet      0.4 mg.         . OxyCODONE (OXYCONTIN) 80 mg T12A 12 hr tablet   Oral   Take 160 mg by mouth every 12 (twelve) hours.         . OXYCODONE HCL ER PO   Oral   Take 80 mg by mouth every 8 (eight) hours.         . promethazine (PHENERGAN) 25 MG tablet   Oral   Take 25 mg by mouth.         . simvastatin (ZOCOR) 80 MG tablet   Oral   Take 80 mg by mouth at bedtime.         Marland Kitchen tiotropium (SPIRIVA HANDIHALER) 18 MCG inhalation capsule   Inhalation   Place 1 capsule into inhaler and inhale daily.         . vitamin C (ASCORBIC ACID) 500 MG tablet   Oral   Take 500 mg by mouth daily.         Marland Kitchen zolpidem (AMBIEN) 10 MG tablet   Oral   Take 0.5 tablets (5 mg total) by mouth at bedtime as needed for sleep.      0     Allergies Nsaids  No family history on file.  Social History History  Substance Use Topics  . Smoking status: Current Every Day Smoker -- 1.00 packs/day    Types: Cigarettes  . Smokeless tobacco: Not on file  . Alcohol Use: No    Review of Systems Constitutional: No fever/chills Eyes: No visual changes. ENT: No sore throat.. Genitourinary: Negative for dysuria. Musculoskeletal patient reports she has chronic back pain Skin: Negative for rash.  10-point ROS otherwise negative.  ____________________________________________   PHYSICAL EXAM:  VITAL SIGNS: ED Triage Vitals  Enc Vitals Group     BP 12/16/14 1927 155/92 mmHg     Pulse Rate 12/16/14 1927 97     Resp 12/16/14 1927 28     Temp 12/16/14 1927 98.1 F (36.7 C)     Temp Source  12/16/14 1927 Oral     SpO2 12/16/14 1927 100 %     Weight 12/16/14 1931 85 lb (38.556 kg)     Height 12/16/14 1931 5\' 4"  (1.626 m)     Head Cir --      Peak Flow --      Pain Score 12/16/14 1932 9     Pain Loc --      Pain Edu? --      Excl. in [place]? --     Constitutional: Alert and oriented. Well appearing and in no acute distress. Eyes: Conjunctivae are normal. PERRL. EOMI. Head: Atraumatic. Nose: No congestion/rhinnorhea. Mouth/Throat: Mucous membranes are moist.  Oropharynx non-erythematous. Neck: No stridor.   Cardiovascular: Normal rate, regular rhythm. Grossly normal heart sounds.  Good peripheral circulation. Respiratory: Normal respiratory effort.  No retractions. Lungs CTAB. Gastrointestinal: Soft patient reports she had recent abdominal surgery complains that it still hurts where the surgery was Musculoskeletal: No lower extremity tenderness nor edema.  No joint effusions. Neurologic:  Normal speech and language. No gross focal neurologic deficits are appreciated. Speech is normal. No gait instability. Skin:  Skin is warm, dry and intact. No rash noted. Psychiatric: Mood and affect are normal. Speech and behavior are normal.  ____________________________________________   LABS (all labs ordered are listed, but only abnormal results are displayed)  Labs Reviewed  CBC - Abnormal; Notable for the following:    WBC 12.8 (*)    RBC 5.38 (*)    MCV 71.7 (*)    MCH 22.4 (*)    MCHC 31.3 (*)    RDW 22.7 (*)    All other components within normal limits  BASIC METABOLIC PANEL  TROPONIN I   ____________________________________________  EKG  EKG seen and interpreted by me normal sinus rhythm rate of 96 normal axis on specific ST-T wave changes ____________________________________________  RADIOLOGY  Chest x-ray read as slight atelectasis by the radiologist no acute infiltrates ____________________________________________   PROCEDURES  Procedure(s) performed:  None  Critical Care performed: No  ____________________________________________   INITIAL IMPRESSION / ASSESSMENT AND PLAN / ED COURSE  Pertinent labs & imaging results that were available during my care of the patient were reviewed by me and considered in my medical decision making (see chart for details).  Patient reports even after 2 DuoNeb since I Medrol she is unable to get to the toilet in the room and back without becoming very short of breath peak flow she reports was 200 something she couldn't remember when I repeated it was 250 best of 3 but most the time it was just barely above 200 ____________________________________________   FINAL CLINICAL IMPRESSION(S) / ED DIAGNOSES  Final diagnoses:  COPD (chronic obstructive pulmonary disease) with acute bronchitis     Nena Polio, MD 12/16/14 2221

## 2014-12-17 ENCOUNTER — Encounter: Payer: Self-pay | Admitting: General Practice

## 2014-12-17 LAB — C DIFFICILE QUICK SCREEN W PCR REFLEX
C Diff antigen: NEGATIVE
C Diff interpretation: NEGATIVE
C Diff toxin: NEGATIVE

## 2014-12-17 LAB — TROPONIN I
TROPONIN I: 0.06 ng/mL — AB (ref <0.031)
Troponin I: 0.08 ng/mL — ABNORMAL HIGH (ref <0.031)

## 2014-12-17 LAB — POTASSIUM: POTASSIUM: 4.3 mmol/L (ref 3.5–5.1)

## 2014-12-17 LAB — MAGNESIUM: MAGNESIUM: 1.7 mg/dL (ref 1.7–2.4)

## 2014-12-17 MED ORDER — BUDESONIDE 0.5 MG/2ML IN SUSP
0.5000 mg | Freq: Two times a day (BID) | RESPIRATORY_TRACT | Status: DC
  Administered 2014-12-18 (×2): 0.5 mg via RESPIRATORY_TRACT
  Filled 2014-12-17 (×2): qty 2

## 2014-12-17 MED ORDER — OXYCODONE HCL ER 20 MG PO T12A
80.0000 mg | EXTENDED_RELEASE_TABLET | Freq: Two times a day (BID) | ORAL | Status: DC
  Administered 2014-12-18 – 2014-12-19 (×3): 80 mg via ORAL
  Filled 2014-12-17 (×3): qty 4

## 2014-12-17 MED ORDER — ENSURE ENLIVE PO LIQD
237.0000 mL | Freq: Three times a day (TID) | ORAL | Status: DC
  Administered 2014-12-17 – 2014-12-19 (×6): 237 mL via ORAL

## 2014-12-17 MED ORDER — COLLAGENASE 250 UNIT/GM EX OINT
TOPICAL_OINTMENT | Freq: Every day | CUTANEOUS | Status: DC
  Administered 2014-12-17 – 2014-12-19 (×3): via TOPICAL
  Filled 2014-12-17: qty 30

## 2014-12-17 MED ORDER — OXYCODONE HCL ER 20 MG PO T12A
80.0000 mg | EXTENDED_RELEASE_TABLET | Freq: Once | ORAL | Status: AC
Start: 2014-12-17 — End: 2014-12-17
  Administered 2014-12-17: 80 mg via ORAL
  Filled 2014-12-17: qty 4

## 2014-12-17 MED ORDER — DEXTROSE 5 % IV SOLN
250.0000 mg | INTRAVENOUS | Status: DC
  Administered 2014-12-18 – 2014-12-19 (×2): 250 mg via INTRAVENOUS
  Filled 2014-12-17 (×4): qty 250

## 2014-12-17 MED ORDER — HYDROMORPHONE HCL 2 MG PO TABS
4.0000 mg | ORAL_TABLET | Freq: Four times a day (QID) | ORAL | Status: DC | PRN
  Administered 2014-12-17 – 2014-12-19 (×6): 4 mg via ORAL
  Filled 2014-12-17 (×6): qty 2

## 2014-12-17 MED ORDER — DIPHENOXYLATE-ATROPINE 2.5-0.025 MG PO TABS
1.0000 | ORAL_TABLET | Freq: Once | ORAL | Status: AC
  Administered 2014-12-17: 1 via ORAL
  Filled 2014-12-17: qty 1

## 2014-12-17 MED ORDER — NICOTINE 14 MG/24HR TD PT24
14.0000 mg | MEDICATED_PATCH | Freq: Every day | TRANSDERMAL | Status: DC
  Administered 2014-12-17 – 2014-12-19 (×3): 14 mg via TRANSDERMAL
  Filled 2014-12-17 (×3): qty 1

## 2014-12-17 MED ORDER — ALBUTEROL SULFATE (2.5 MG/3ML) 0.083% IN NEBU: 2.5000 mg | INHALATION_SOLUTION | RESPIRATORY_TRACT | Status: DC | PRN

## 2014-12-17 MED ORDER — MAGNESIUM SULFATE 2 GM/50ML IV SOLN
2.0000 g | Freq: Once | INTRAVENOUS | Status: AC
  Administered 2014-12-17: 2 g via INTRAVENOUS
  Filled 2014-12-17: qty 50

## 2014-12-17 MED ORDER — POTASSIUM CHLORIDE CRYS ER 20 MEQ PO TBCR
40.0000 meq | EXTENDED_RELEASE_TABLET | Freq: Two times a day (BID) | ORAL | Status: DC
  Administered 2014-12-17: 40 meq via ORAL
  Filled 2014-12-17: qty 2

## 2014-12-17 MED ORDER — IPRATROPIUM-ALBUTEROL 0.5-2.5 (3) MG/3ML IN SOLN
3.0000 mL | Freq: Four times a day (QID) | RESPIRATORY_TRACT | Status: DC
  Administered 2014-12-17 – 2014-12-18 (×5): 3 mL via RESPIRATORY_TRACT
  Filled 2014-12-17 (×5): qty 3

## 2014-12-17 MED ORDER — BUDESONIDE 0.5 MG/2ML IN SUSP: 0.2500 mg | Freq: Two times a day (BID) | RESPIRATORY_TRACT | Status: DC

## 2014-12-17 NOTE — Progress Notes (Signed)
Initial Nutrition Assessment  DOCUMENTATION CODES:  Severe malnutrition in context of chronic illness  INTERVENTION: Medical Nutrition Supplement: Ensure Enlive (each supplement provides 350kcal and 20 grams of protein) TID with meals (chocolate) Meals and Snacks: Cater to patient preferences   NUTRITION DIAGNOSIS:  Inadequate oral intake related to chronic illness as evidenced by per patient/family report, percent weight loss.    GOAL:  Patient will meet greater than or equal to 90% of their needs    MONITOR:   (Energy Intake, Supplement Acceptance, Anthropometrics, Skin, Electrolyte and renal profile)  REASON FOR ASSESSMENT:  Malnutrition Screening Tool    ASSESSMENT:  Reason For Admission: COPD Exacerbation PMHx: Past Medical History  Diagnosis Date  . COPD (chronic obstructive pulmonary disease)   . Hypertension   . Ischemic cardiomyopathy     a.   . Coronary artery disease     a. s/p multiple stenting in 2005  . History of ventricular tachycardia   . PVD (peripheral vascular disease)   . PAD (peripheral artery disease)   . HLD (hyperlipidemia)   . Chronic back pain   . MI, old   . MRSA (methicillin resistant staph aureus) culture positive     left foot wound    Typical Fluid/ Food Intake: 100% of intake recorded per I/O's Meal/ Snack Patterns: Patient reports a variable appetite and intake PTA. She states that sometimes she is in a lot of pain and has trouble breathing and she doesn't eat. On a "normal" day she reports eating eggs/ bacon at breakfast and not eating again until dinner that consist of a a protein source and 1-2 vegetables and a starch.  Supplements: Ensure TID currently ordered  Labs:  Electrolyte and Renal Profile:    Recent Labs Lab 12/16/14 1957 12/17/14 1020  BUN <5*  --   CREATININE 0.57  --   NA 138  --   K 2.6* 4.3  MG  --  1.7    Meds: Lipitor  UOP:   Intake/Output Summary (Last 24 hours) at 12/17/14 1506 Last  data filed at 12/17/14 1334  Gross per 24 hour  Intake    960 ml  Output      0 ml  Net    960 ml     Physical Findings: Nutrition-Focused physical exam completed. Findings are severe fat depletion, severe muscle depletion, and no edema.    Weight Changes: Reviewed weight history with patient and reviewed previous hospital records in [facility]. Weight in March 2016- 90.1# Weight in Jan 2016- 104# Current weight represents a 14% weight loss x 5 months- significant.   Height:  Ht Readings from Last 1 Encounters:  12/16/14 5\' 4"  (1.626 m)    Weight:  Wt Readings from Last 1 Encounters:  12/16/14 89 lb 14.4 oz (40.778 kg)    Ideal Body Weight:     Wt Readings from Last 10 Encounters:  12/16/14 89 lb 14.4 oz (40.778 kg)  11/26/14 107 lb 1.6 oz (48.58 kg)  07/28/14 102 lb 15.3 oz (46.7 kg)    BMI:  Body mass index is 15.42 kg/(m^2).  Estimated Nutritional Needs:  Kcal:  1526-1780 kcal/day (BEE: 1060 x 1.2 AF x 1.2-1.4 IF)  Protein:  62-74 g Pro/day (1.2-1.4 g Pro/kg/day)  Fluid:  1350-1620 ml/ day (25-30 ml/kg)  Skin:  Wound (see comment) (Stage 2, Sacrum)  Diet Order:  Diet Heart Room service appropriate?: Yes; Fluid consistency:: Thin  EDUCATION NEEDS:  No education needs identified at this time  Intake/Output Summary (Last 24 hours) at 12/17/14 1506 Last data filed at 12/17/14 1334  Gross per 24 hour  Intake    960 ml  Output      0 ml  Net    960 ml    Last BM:  5/20  Roda Shutters, RDN Pager: [phone] Office: [place]

## 2014-12-17 NOTE — Care Management Note (Signed)
Case Management Note  Patient Details  Name: Lynn Butler MRN: [id number] Date of Birth: Nov 27, 1945  Subjective/Objective:  COPD GOLD patient.  Admitted with COPD exacerbation. On chronic O2 @ 4L. Patient reports she is active with [facility] receiving nursing and PT services. Confirmed services with Corliss Blacker at [facility]. Pt lives at home with 3 people that assist her as needed. She uses a cane and walker. Pt reports she has been seen by her PCP, Dr. Petra Kuba in [facility] approximately 2 weeks ago.  She has not seen her Pulmonologist in [place] in quiet a while due to the distant. Pt requesting recommendations to a local pulmonologist. She reports compliance with her medications, including inhalers and O2. Denies issues obtaining medications, copays, shelter or medical care.  RNCM to follow.              Action/Plan:   Expected Discharge Date:                  Expected Discharge Plan:  [place]  In-House Referral:     Discharge planning Services  CM Consult  Post Acute Care Choice:    Choice offered to:     DME Arranged:    DME Agency:     HH Arranged:    [place] Agency:  [facility]  (Nursing and PT)  Status of Service:  In process, will continue to follow  Medicare Important Message Given:    Date Medicare IM Given:    Medicare IM give by:    Date Additional Medicare IM Given:    Additional Medicare Important Message give by:     If discussed at [place] of Stay Meetings, dates discussed:    Additional Comments:  Jolly Mango, RN 12/17/2014, 2:13 PM

## 2014-12-17 NOTE — Progress Notes (Signed)
Lynn Butler arrived from ED around midnight, oriented to unit, room and staff, on Contact precautions. Pain, anxiety and insomnia controlled with PRN medications. No nausea, c/o poor appetite. Educated on COPD Gold, smoking cessation, MRSA and breathing techniques. Received IV ABX with no adverse effects, afebrile. Nursing continues to monitor and assist with ADLs.

## 2014-12-17 NOTE — Consult Note (Signed)
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 Stage III pressure ulcer to coccyx, present on admission. Stage II pressure ulcer to left heel, present on admission.  [place] wound care.  Pressure Ulcer POA: Yes Measurement: 4 cm x 2 cm x 0.2 cm Coccyx 0.5 cm x 0.5 cm x 0.1 cm  Left heel 1 cm x 0.5 cm x 0.1 cm Wound bed:100% pink and moist Drainage (amount, consistency, odor) Minimal serosanguinous drainage.  No odor.  Periwound:Intact Dressing procedure/placement/frequency:Cleanse ulcer to left dorsal foot with NS and pat gently dry.  Apply Santyl ointment to wound bed, 1/8 " thick (opaque).  Cover with NS moist dressing and cover with 4x4 gauze and kerlix/tape.  Change daily.   Cleanse coccyx ulcer with NS and pat gently dry.  Apply Santyl ointment to wound bed. Top with NS moist dressing and cover with 4x4 gauze and tape.  Change daily.   Silicone border foam dressing to left heel.  Change every 3 days and PRN soilage.  Will not follow at this time.  Please re-consult if needed.  Domenic Moras RN BSN [place] Pager [phone]

## 2014-12-17 NOTE — Progress Notes (Signed)
Pt requesting nicoderm patch.  MD gave order for nicoderm patch

## 2014-12-17 NOTE — Progress Notes (Addendum)
[facility] PROGRESS NOTE  Lynn Butler ZYS:[id number] DOB: 08/22/1945 DOA: 12/16/2014 PCP: No primary care provider on file.  HPI/Subjective: Patient short of breath. Wheezing. Yesterday she can hardly move without being very short of breath. She has chronic pain in her back and legs and abdomen. She is on very high doses of pain medications.  Objective: Filed Vitals:   12/17/14 1200  BP: 122/64  Pulse: 84  Temp: 97.9 F (36.6 C)  Resp: 18    Intake/Output Summary (Last 24 hours) at 12/17/14 1454 Last data filed at 12/17/14 1334  Gross per 24 hour  Intake    960 ml  Output      0 ml  Net    960 ml   Filed Weights   12/16/14 1931 12/16/14 2332  Weight: 38.556 kg (85 lb) 40.778 kg (89 lb 14.4 oz)    ROS: Review of Systems  Constitutional: Negative for fever and chills.  Eyes: Negative for blurred vision.  Respiratory: Positive for cough, shortness of breath and wheezing.   Cardiovascular: Negative for chest pain.  Gastrointestinal: Positive for abdominal pain. Negative for nausea, vomiting, diarrhea and constipation.  Genitourinary: Negative for dysuria.  Musculoskeletal: Positive for back pain. Negative for joint pain.  Neurological: Negative for dizziness and headaches.   Exam: Physical Exam  Constitutional: She is oriented to person, place, and time.  HENT:  Nose: No mucosal edema.  Mouth/Throat: No oropharyngeal exudate or posterior oropharyngeal edema.  Eyes: Conjunctivae, EOM and lids are normal. Pupils are equal, round, and reactive to light.  Neck: No JVD present. Carotid bruit is not present. No edema present. No thyroid mass and no thyromegaly present.  Cardiovascular: S1 normal and S2 normal.  Exam reveals no gallop.   No murmur heard. Pulses:      Dorsalis pedis pulses are 2+ on the right side, and 2+ on the left side.  Respiratory: No respiratory distress. She has decreased breath sounds in the right upper field, the right middle field,  the right lower field, the left upper field, the left middle field and the left lower field. She has wheezes in the right upper field, the right middle field, the right lower field, the left upper field, the left middle field and the left lower field. She has no rhonchi. She has no rales.  GI: Soft. Bowel sounds are normal. There is generalized tenderness.  Musculoskeletal:       Right ankle: She exhibits no swelling.       Left ankle: She exhibits no swelling.  Lymphadenopathy:    She has no cervical adenopathy.  Neurological: She is alert and oriented to person, place, and time. No cranial nerve deficit.  Skin: Skin is warm. No rash noted.  On the left for head there is likely a basal cell cancer with scabbing larger than a silver dollar size. On the right for had a smaller likely basal cell cancer less than the size of a dime. On the left nasolabial fold there is another likely basal cell cancer with ulceration. On the right cheek another likely basal cell cancer.  Psychiatric: She has a normal mood and affect.    Data Reviewed: Basic Metabolic Panel:  Recent Labs Lab 12/16/14 1957 12/17/14 1020  NA 138  --   K 2.6* 4.3  CL 103  --   CO2 25  --   GLUCOSE 109*  --   BUN <5*  --   CREATININE 0.57  --   CALCIUM 8.8*  --  MG  --  1.7   CBC:  Recent Labs Lab 12/16/14 1957  WBC 12.8*  HGB 12.1  HCT 38.6  MCV 71.7*  PLT 378   Cardiac Enzymes:  Recent Labs Lab 12/16/14 1957 12/16/14 2310 12/17/14 0221 12/17/14 1020  TROPONINI 0.06* 0.07* 0.06* 0.08*     Recent Results (from the past 240 hour(s))  C difficile quick scan w PCR reflex [facility])     Status: None   Collection Time: 12/17/14 10:36 AM  Result Value Ref Range Status   C Diff antigen NEGATIVE  Final   C Diff toxin NEGATIVE  Final   C Diff interpretation Negative for C. difficile  Final     Studies: Dg Chest 2 View (if Patient Has Fever And/or Copd)  12/16/2014   CLINICAL DATA:  Acute onset of shortness  of breath. Initial encounter.  EXAM: CHEST  2 VIEW  COMPARISON:  Chest radiograph performed 11/20/2014  FINDINGS: The lungs are well-aerated. Minimal right-sided atelectasis is noted. There is no evidence of pleural effusion or pneumothorax.  The heart is normal in size; an AICD is noted at the left chest wall, with a single lead ending at the right ventricle. No acute osseous abnormalities are seen.  IMPRESSION: Minimal right-sided atelectasis noted; lungs otherwise clear.   Electronically Signed   By: Garald Balding M.D.   On: 12/16/2014 20:35    Scheduled Meds: . amLODipine  5 mg Oral Daily  . atorvastatin  80 mg Oral Daily  . [START ON 12/18/2014] azithromycin  250 mg Intravenous Q24H  . carvedilol  3.125 mg Oral BID  . clopidogrel  75 mg Oral Daily  . collagenase   Topical Daily  . feeding supplement (ENSURE ENLIVE)  237 mL Oral TID BM  . heparin  5,000 Units Subcutaneous 3 times per day  . ipratropium-albuterol  3 mL Nebulization QID  . methylPREDNISolone (SOLU-MEDROL) injection  60 mg Intravenous Daily  . mometasone-formoterol  2 puff Inhalation BID  . [START ON 12/18/2014] OxyCODONE  80 mg Oral Q12H  . OxyCODONE  80 mg Oral Once  . sodium chloride  3 mL Intravenous Q12H  . tiotropium  1 capsule Inhalation Daily  . vitamin C  500 mg Oral Daily   Continuous Infusions:   Assessment/Plan:  1. COPD exacerbation, chronic respiratory failure on 4 L nasal cannula- continue high-dose Solu-Medrol 60 mg every 12 hours, continue Zithromax, continue nebulizers, add budesonide nebulizer. 2. Chronic pain syndrome-back pain, abdominal pain, leg pain. I do not want this patient to go through withdrawal I will start half her dose of OxyContin, when necessary diluted ordered. 3.   Elevated troponin- this is demand ischemia from chronic respiratory failure and COPD exacerbation no further cardiac workup needed. DC telemetry 4.   Essential hypertension- continue usual medications. 5.   Hyperlipidemia  unspecified continue high-dose atorvastatin. 6.   Coronary artery disease and peripheral vascular disease on Plavix and Coreg. 7.    Numerous basal cell cancers on the face- likely will need plastic surgery as outpatient. 8.    Hypokalemia and hypomagnesemia- potassium supplemented into the normal range, IV magnesium today. Recheck levels tomorrow morning.   Code Status:     Code Status Orders        Start     Ordered   12/16/14 2226  Full code   Continuous     12/16/14 2226     Disposition Plan: Home   Time spent: 35 minutes   Loletha Grayer  [facility]

## 2014-12-18 LAB — MAGNESIUM: MAGNESIUM: 2.2 mg/dL (ref 1.7–2.4)

## 2014-12-18 LAB — BASIC METABOLIC PANEL
ANION GAP: 6 (ref 5–15)
BUN: 33 mg/dL — AB (ref 6–20)
CALCIUM: 8.2 mg/dL — AB (ref 8.9–10.3)
CO2: 28 mmol/L (ref 22–32)
Chloride: 104 mmol/L (ref 101–111)
Creatinine, Ser: 0.84 mg/dL (ref 0.44–1.00)
GFR calc Af Amer: >60 mL/min (ref >60)
Glucose, Bld: 90 mg/dL (ref 65–99)
Potassium: 4.7 mmol/L (ref 3.5–5.1)
Sodium: 138 mmol/L (ref 135–145)

## 2014-12-18 MED ORDER — DIAZEPAM 5 MG PO TABS: 10.0000 mg | ORAL_TABLET | ORAL | Status: DC

## 2014-12-18 MED ORDER — IPRATROPIUM-ALBUTEROL 0.5-2.5 (3) MG/3ML IN SOLN: 3.0000 mL | RESPIRATORY_TRACT | Status: DC | PRN

## 2014-12-18 MED ORDER — DIAZEPAM 5 MG PO TABS
10.0000 mg | ORAL_TABLET | ORAL | Status: DC | PRN
Start: 2014-12-18 — End: 2014-12-19
  Administered 2014-12-18 – 2014-12-19 (×3): 10 mg via ORAL
  Filled 2014-12-18 (×3): qty 2

## 2014-12-18 MED ORDER — GUAIFENESIN ER 600 MG PO TB12
600.0000 mg | ORAL_TABLET | Freq: Two times a day (BID) | ORAL | Status: DC
  Administered 2014-12-18 – 2014-12-19 (×3): 600 mg via ORAL
  Filled 2014-12-18 (×3): qty 1

## 2014-12-18 NOTE — Progress Notes (Addendum)
[facility] Physicians PROGRESS NOTE  Maleny Candy IOX:[id number] DOB: 1945-12-10 DOA: 12/16/2014 PCP: No primary care provider on file.  HPI/Subjective: Still complains of shortness of breath and wheezing , states that not able to break up or cough .  Objective: Filed Vitals:   12/18/14 0817  BP: 120/64  Pulse: 82  Temp: 97.5 F (36.4 C)  Resp: 18    Intake/Output Summary (Last 24 hours) at 12/18/14 1301 Last data filed at 12/18/14 0700  Gross per 24 hour  Intake    720 ml  Output    300 ml  Net    420 ml   Filed Weights   12/16/14 1931 12/16/14 2332  Weight: 38.556 kg (85 lb) 40.778 kg (89 lb 14.4 oz)    ROS: Review of Systems  Constitutional: Negative for fever and chills.  Eyes: Negative for blurred vision.  Respiratory: Positive for cough, shortness of breath and wheezing.   Cardiovascular: Negative for chest pain.  Gastrointestinal: Positive for abdominal pain. Negative for nausea, vomiting, diarrhea and constipation.  Genitourinary: Negative for dysuria.  Musculoskeletal: Positive for back pain. Negative for joint pain.  Neurological: Negative for dizziness and headaches.   Exam: Physical Exam  Constitutional: She is oriented to person, place, and time.  HENT:  Nose: No mucosal edema.  Mouth/Throat: No oropharyngeal exudate or posterior oropharyngeal edema.  Eyes: Conjunctivae, EOM and lids are normal. Pupils are equal, round, and reactive to light.  Neck: No JVD present. Carotid bruit is not present. No edema present. No thyroid mass and no thyromegaly present.  Cardiovascular: S1 normal and S2 normal.  Exam reveals no gallop.   No murmur heard. Pulses:      Dorsalis pedis pulses are 2+ on the right side, and 2+ on the left side.  Respiratory: No respiratory distress. She has decreased breath sounds in the right upper field, the right middle field, the right lower field, the left upper field, the left middle field and the left lower field. She has  wheezes in the right upper field, the right middle field, the right lower field, the left upper field, the left middle field and the left lower field. She has no rhonchi. She has no rales.  GI: Soft. Bowel sounds are normal. There is generalized tenderness.  Musculoskeletal:       Right ankle: She exhibits no swelling.       Left ankle: She exhibits no swelling.  Lymphadenopathy:    She has no cervical adenopathy.  Neurological: She is alert and oriented to person, place, and time. No cranial nerve deficit.  Skin: Skin is warm. No rash noted.  On the left for head there is likely a basal cell cancer with scabbing larger than a silver dollar size. On the right for had a smaller likely basal cell cancer less than the size of a dime. On the left nasolabial fold there is another likely basal cell cancer with ulceration. On the right cheek another likely basal cell cancer.  Psychiatric: She has a normal mood and affect.    Data Reviewed: Basic Metabolic Panel:  Recent Labs Lab 12/16/14 1957 12/17/14 1020 12/18/14 0418  NA 138  --  138  K 2.6* 4.3 4.7  CL 103  --  104  CO2 25  --  28  GLUCOSE 109*  --  90  BUN <5*  --  33*  CREATININE 0.57  --  0.84  CALCIUM 8.8*  --  8.2*  MG  --  1.7  2.2   CBC:  Recent Labs Lab 12/16/14 1957  WBC 12.8*  HGB 12.1  HCT 38.6  MCV 71.7*  PLT 378   Cardiac Enzymes:  Recent Labs Lab 12/16/14 1957 12/16/14 2310 12/17/14 0221 12/17/14 1020  TROPONINI 0.06* 0.07* 0.06* 0.08*     Recent Results (from the past 240 hour(s))  C difficile quick scan w PCR reflex [facility])     Status: None   Collection Time: 12/17/14 10:36 AM  Result Value Ref Range Status   C Diff antigen NEGATIVE  Final   C Diff toxin NEGATIVE  Final   C Diff interpretation Negative for C. difficile  Final     Studies: Dg Chest 2 View (if Patient Has Fever And/or Copd)  12/16/2014   CLINICAL DATA:  Acute onset of shortness of breath. Initial encounter.  EXAM: CHEST  2  VIEW  COMPARISON:  Chest radiograph performed 11/20/2014  FINDINGS: The lungs are well-aerated. Minimal right-sided atelectasis is noted. There is no evidence of pleural effusion or pneumothorax.  The heart is normal in size; an AICD is noted at the left chest wall, with a single lead ending at the right ventricle. No acute osseous abnormalities are seen.  IMPRESSION: Minimal right-sided atelectasis noted; lungs otherwise clear.   Electronically Signed   By: Garald Balding M.D.   On: 12/16/2014 20:35    Scheduled Meds: . amLODipine  5 mg Oral Daily  . atorvastatin  80 mg Oral Daily  . azithromycin  250 mg Intravenous Q24H  . budesonide (PULMICORT) nebulizer solution  0.5 mg Nebulization BID  . carvedilol  3.125 mg Oral BID  . clopidogrel  75 mg Oral Daily  . collagenase   Topical Daily  . feeding supplement (ENSURE ENLIVE)  237 mL Oral TID BM  . guaiFENesin  600 mg Oral BID  . heparin  5,000 Units Subcutaneous 3 times per day  . methylPREDNISolone (SOLU-MEDROL) injection  60 mg Intravenous Daily  . nicotine  14 mg Transdermal Daily  . OxyCODONE  80 mg Oral Q12H  . sodium chloride  3 mL Intravenous Q12H  . tiotropium  1 capsule Inhalation Daily  . vitamin C  500 mg Oral Daily   Continuous Infusions:   Assessment/Plan:  1. COPD exacerbation, chronic respiratory failure on 4 L nasal cannula-slow to improve, continue high-dose Solu-Medrol 60 mg every 12 hours, continue Zithromax, continue nebulizers budesonide nebulizer. Add mucomyst to her  her current regimen Chronic pain syndrome-back pain, abdominal pain, leg pain.  3.   Elevated troponin- this is demand ischemia from chronic respiratory failure and COPD exacerbation no further cardiac workup needed.  4.   Essential hypertension- continue Coreg and amlodipine 5.   Hyperlipidemia unspecified continue high-dose atorvastatin. 6.   Coronary artery disease and peripheral vascular disease on Plavix and Coreg. 7.    Numerous basal cell  cancers on the face- likely will need plastic surgery as outpatient. 8.    Hypokalemia and hypomagnesemia- replaced Code Status:     Code Status Orders        Start     Ordered   12/16/14 2226  Full code   Continuous     12/16/14 2226     Disposition Plan: Home   Time spent: 35 minutes   Shawnta Zimbelman, [facility]

## 2014-12-18 NOTE — Progress Notes (Signed)
Good appetite .  Tolerating ensure supplements .positions  Self. +DOE. LUNGS CONGESTED. NP COUGH NOTED.RECEIVING IV ZITHROMAX . Marland KitchenPRESSURE ULCER TO LEFT INNER ANKLE . WOUND BASE PINK WITH SEROUS [place]. DRESSING CHG PER MD ORDERS. TOLERATED WELL. CONTINUES TO VERBALIZE SX OF ANXIETY. RECEIVING VALIUM WITH RELIEF

## 2014-12-18 NOTE — Progress Notes (Signed)
[name] remains on contact isolation. Pain, anxiety and insomnia controlled with PRN medications. Had tearful phone call with family, emotional support provided. Nursing continues to monitor and assist with ADLs and wound care.

## 2014-12-19 LAB — PLATELET COUNT: Platelets: 286 10*3/uL (ref 150–440)

## 2014-12-19 MED ORDER — GUAIFENESIN ER 600 MG PO TB12: 600.0000 mg | ORAL_TABLET | Freq: Two times a day (BID) | ORAL | Status: AC

## 2014-12-19 MED ORDER — COLLAGENASE 250 UNIT/GM EX OINT: TOPICAL_OINTMENT | Freq: Every day | CUTANEOUS | Status: DC

## 2014-12-19 MED ORDER — PREDNISONE 10 MG PO TABS: 10.0000 mg | ORAL_TABLET | Freq: Every day | ORAL | Status: DC

## 2014-12-19 MED ORDER — BUDESONIDE-FORMOTEROL FUMARATE 160-4.5 MCG/ACT IN AERO: 2.0000 | INHALATION_SPRAY | Freq: Two times a day (BID) | RESPIRATORY_TRACT | Status: DC

## 2014-12-19 MED ORDER — OXYCODONE HCL ER 80 MG PO T12A: 80.0000 mg | EXTENDED_RELEASE_TABLET | Freq: Two times a day (BID) | ORAL | Status: DC

## 2014-12-19 MED ORDER — DIAZEPAM 10 MG PO TABS: 10.0000 mg | ORAL_TABLET | ORAL | Status: DC | PRN

## 2014-12-19 MED ORDER — DIPHENHYDRAMINE HCL 25 MG PO CAPS
25.0000 mg | ORAL_CAPSULE | Freq: Once | ORAL | Status: AC
  Administered 2014-12-19: 25 mg via ORAL
  Filled 2014-12-19: qty 1

## 2014-12-19 MED ORDER — HYDROMORPHONE HCL 4 MG PO TABS: 4.0000 mg | ORAL_TABLET | Freq: Four times a day (QID) | ORAL | Status: DC | PRN

## 2014-12-19 NOTE — Discharge Summary (Addendum)
Lynn Butler, 69 y.o., DOB 19-Apr-1946, MRN [id number]. Admission date: 12/16/2014 Discharge Date 12/19/2014 Primary MD No primary care provider on file. Admitting Physician Lytle Butte, MD  Admission Diagnosis  COPD (chronic obstructive pulmonary disease) with acute bronchitis [J44.1]  Discharge Diagnosis   Principal Problem:   COPD exacerbation Active Problems:   COPD mixed type  Decubitus ulcer stage 3 to coccyx and decubitus ulcer stage 2 to the left heel  Past Medical History  Diagnosis Date  . COPD (chronic obstructive pulmonary disease)   . Hypertension   . Ischemic cardiomyopathy     a.   . Coronary artery disease     a. s/p multiple stenting in 2005  . History of ventricular tachycardia   . PVD (peripheral vascular disease)   . PAD (peripheral artery disease)   . HLD (hyperlipidemia)   . Chronic back pain   . MI, old   . MRSA (methicillin resistant staph aureus) culture positive     left foot wound    Past Surgical History  Procedure Laterality Date  . Insert / replace / remove pacemaker        [place] is a 69 y.o. 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 She describes one week total duration of shortness of breath which is constant, associated cough productive of white to clear sputum. Patient was treated for acute on chronic COPD exacerbation as well as acute bronchitis she was slow to improve but as this had significant improvement in her respiratory status and currently close to baseline. She will have home health and home aide nurse arrangement for home. Patient also was noted to have a large skin lesion that needs to be evaluated by dermatology as outpatient.   Principal Problem:   COPD exacerbation Active Problems:   COPD mixed type    Consults  None  Significant  Tests:  See full reports for all details    Dg Chest 2 View (if Patient Has Fever And/or Copd)  12/16/2014   CLINICAL DATA:  Acute onset of shortness of breath. Initial encounter.  EXAM: CHEST  2 VIEW  COMPARISON:  Chest radiograph performed 11/20/2014  FINDINGS: The lungs are well-aerated. Minimal right-sided atelectasis is noted. There is no evidence of pleural effusion or pneumothorax.  The heart is normal in size; an AICD is noted at the left chest wall, with a single lead ending at the right ventricle. No acute osseous abnormalities are seen.  IMPRESSION: Minimal right-sided atelectasis noted; lungs otherwise clear.   Electronically Signed   By: Garald Balding M.D.   On: 12/16/2014 20:35   Dg Chest 2 View  11/20/2014   CLINICAL DATA:  CHF.  Small-bowel obstruction  EXAM: CHEST  2 VIEW  COMPARISON:  11/16/2014  FINDINGS: Mild cardiac enlargement. AICD unchanged in position. NG tube in the stomach  COPD. Vascular congestion and mild edema has developed since the prior study. No significant pleural effusion. Mild bibasilar atelectasis.  Findings are consistent with pneumoperitoneum on the right. The patient has had recent laparotomy for small bowel obstruction.  IMPRESSION: Mild heart failure and interstitial edema  Pneumoperitoneum attributed to recent laparotomy.   Electronically Signed   By: Franchot Gallo M.D.   On: 11/20/2014 14:29       Today   Subjective:   Lynn Butler breathing much improved today and this  was to go home,  Objective:   Blood pressure 122/68, pulse 73, temperature 97.3 F (36.3 C), temperature source Oral, resp. rate 18, height 5\' 4"  (1.626 m), weight 40.778 kg (89 lb 14.4 oz), SpO2 100 %.  .  Intake/Output Summary (Last 24 hours) at 12/19/14 1219 Last data filed at 12/19/14 1660  Gross per 24 hour  Intake    480 ml  Output      0 ml  Net    480 ml    Exam VITAL SIGNS: Blood pressure 122/68, pulse 73, temperature 97.3 F (36.3 C), temperature source Oral,  resp. rate 18, height 5\' 4"  (1.626 m), weight 40.778 kg (89 lb 14.4 oz), SpO2 100 %.  GENERAL:  69 y.o.-year-old patient lying in the bed with no acute distress.  EYES: Pupils equal, round, reactive to light and accommodation. No scleral icterus. Extraocular muscles intact.  HEENT: Head atraumatic, normocephalic. Oropharynx and nasopharynx clear.  NECK:  Supple, no jugular venous distention. No thyroid enlargement, no tenderness.  LUNGS: Diminished breath sounds bilaterally, no wheezing, rales,rhonchi or crepitation. No use of accessory muscles of respiration.  CARDIOVASCULAR: S1, S2 normal. No murmurs, rubs, or gallops.  ABDOMEN: Soft, nontender, nondistended. Bowel sounds present. No organomegaly or mass.  EXTREMITIES: No pedal edema, cyanosis, or clubbing.  NEUROLOGIC: Cranial nerves II through XII are intact. Muscle strength 5/5 in all extremities. Sensation intact. Gait not checked.  PSYCHIATRIC: The patient is alert and oriented x 3.  SKIN: No obvious rash, lesion, or ulcer.   Data Review   Cultures -   CBC w Diff: Lab Results  Component Value Date   WBC 12.8* 12/16/2014   WBC 6.9 11/22/2014   HGB 12.1 12/16/2014   HGB 10.3* 11/22/2014   HCT 38.6 12/16/2014   HCT 32.9* 11/22/2014   PLT 286 12/19/2014   PLT 189 11/26/2014   LYMPHOPCT 20.8 11/22/2014   MONOPCT 11.4 11/22/2014   EOSPCT 0.3 11/22/2014   BASOPCT 0.5 11/22/2014   CMP: Lab Results  Component Value Date   NA 138 12/18/2014   NA 139 11/25/2014   K 4.7 12/18/2014   K 3.8 11/25/2014   CL 104 12/18/2014   CL 103 11/25/2014   CO2 28 12/18/2014   CO2 31 11/25/2014   BUN 33* 12/18/2014   BUN 25* 11/25/2014   CREATININE 0.84 12/18/2014   CREATININE 0.66 11/25/2014   PROT 7.1 11/15/2014   ALBUMIN 3.3* 11/15/2014   ALKPHOS 109 11/15/2014   AST 23 11/15/2014   ALT 14 11/15/2014  .  Micro Results Recent Results (from the past 240 hour(s))  C difficile quick scan w PCR reflex [facility])     Status: None    Collection Time: 12/17/14 10:36 AM  Result Value Ref Range Status   C Diff antigen NEGATIVE  Final   C Diff toxin NEGATIVE  Final   C Diff interpretation Negative for C. difficile  Final     Discharge Instructions      Follow-up Information    Follow up with [place] In 7 days.   Contact information:   [place] [address] [place] [id number] [phone]       Follow up with [facility] dermatology In 2 weeks.      Discharge Medications     Medication List    TAKE these medications        ADVAIR DISKUS 250-50 MCG/DOSE Aepb  Generic drug:  Fluticasone-Salmeterol  Inhale 1 puff into the  lungs 2 (two) times daily.     albuterol 108 (90 BASE) MCG/ACT inhaler  Commonly known as:  PROAIR HFA  Inhale 1-2 puffs into the lungs every 4 (four) hours as needed for wheezing or shortness of breath.     amLODipine 5 MG tablet  Commonly known as:  NORVASC  Take 5 mg by mouth daily.     atorvastatin 80 MG tablet  Commonly known as:  LIPITOR  Take 80 mg by mouth daily.     collagenase ointment  Commonly known as:  SANTYL  Apply topically daily.     COREG 3.125 MG tablet  Generic drug:  carvedilol  Take 3.125 mg by mouth 2 (two) times daily.     diazepam 10 MG tablet  Commonly known as:  VALIUM  Take 1 tablet (10 mg total) by mouth every 4 (four) hours as needed for anxiety.     feeding supplement (ENSURE COMPLETE) Liqd  Take 237 mLs by mouth 3 (three) times daily between meals.     furosemide 20 MG tablet  Commonly known as:  LASIX  Take 20 mg by mouth daily as needed.     guaiFENesin 600 MG 12 hr tablet  Commonly known as:  MUCINEX  Take 1 tablet (600 mg total) by mouth 2 (two) times daily.     HYDROmorphone 4 MG tablet  Commonly known as:  DILAUDID  Take 1 tablet (4 mg total) by mouth every 6 (six) hours as needed for severe pain.     ipratropium-albuterol 0.5-2.5 (3) MG/3ML Soln  Commonly known as:  DUONEB  Take 3 mLs by nebulization every  6 (six) hours as needed (wheezing, shortness of breath).     levofloxacin 750 MG tablet  Commonly known as:  LEVAQUIN  Take 1 tablet (750 mg total) by mouth daily.     nitroGLYCERIN 0.4 MG SL tablet  Commonly known as:  NITROSTAT  0.4 mg.     OxyCODONE 80 mg T12a 12 hr tablet  Commonly known as:  OXYCONTIN  Take 1 tablet (80 mg total) by mouth every 12 (twelve) hours.     PLAVIX 75 MG tablet  Generic drug:  clopidogrel  Take 75 mg by mouth daily.     predniSONE 10 MG tablet  Commonly known as:  DELTASONE  Take 1 tablet (10 mg total) by mouth daily with breakfast. Label  & dispense according to the schedule below.  6 tablets day one, then 5 table day 2, then 4 tablets day 3, then 3 tablets day 4, 2 tablets day 5, then 1 tablet day 6, then stop     promethazine 25 MG tablet  Commonly known as:  PHENERGAN  Take 25 mg by mouth.     simvastatin 80 MG tablet  Commonly known as:  ZOCOR  Take 80 mg by mouth at bedtime.     SPIRIVA HANDIHALER 18 MCG inhalation capsule  Generic drug:  tiotropium  Place 1 capsule into inhaler and inhale daily.     vitamin C 500 MG tablet  Commonly known as:  ASCORBIC ACID  Take 500 mg by mouth daily.     zolpidem 10 MG tablet  Commonly known as:  AMBIEN  Take 0.5 tablets (5 mg total) by mouth at bedtime as needed for sleep.         Total Time in preparing paper work, data evaluation and todays exam - 35 minutes  Dustin Flock M.D on 12/19/2014 at 12:19 [facility]  [facility]  Physicians   Office  [phone]

## 2014-12-19 NOTE — Progress Notes (Addendum)
Resume home health  RN and nurse aid with [facility] after hospital discharge today. V/O: Dr Dustin Flock / Braxton Feathers, RN, BSN. 12/19/14 @ 11:30am.

## 2014-12-19 NOTE — Progress Notes (Signed)
Pt has levaquin on d/c instructions. rn informed dr patel who stated if pt does not have med ,she has already received adequate tx. Pt informed. D/c home with scripts for pain meds dilaudid,oxycontin,valium and prednisone. I/s on new and changes meds . Pt d/c home on 4 liters oxygen

## 2014-12-19 NOTE — Discharge Instructions (Signed)
°  DIET:  Cardiac diet  DISCHARGE CONDITION:  Stable  ACTIVITY:  Activity as tolerated  OXYGEN:  Home Oxygen: Yes.     Oxygen Delivery: 4 liters/min via Patient connected to nasal cannula oxygen  DISCHARGE LOCATION:  home , home health  If you experience worsening of your admission symptoms, develop shortness of breath, life threatening emergency, suicidal or homicidal thoughts you must seek medical attention immediately by calling 911 or calling your MD immediately  if symptoms less severe.  You Must read complete instructions/literature along with all the possible adverse reactions/side effects for all the Medicines you take and that have been prescribed to you. Take any new Medicines after you have completely understood and accpet all the possible adverse reactions/side effects.   Please note  You were cared for by a hospitalist during your hospital stay. If you have any questions about your discharge medications or the care you received while you were in the hospital after you are discharged, you can call the unit and asked to speak with the hospitalist on call if the hospitalist that took care of you is not available. Once you are discharged, your primary care physician will handle any further medical issues. Please note that NO REFILLS for any discharge medications will be authorized once you are discharged, as it is imperative that you return to your primary care physician (or establish a relationship with a primary care physician if you do not have one) for your aftercare needs so that they can reassess your need for medications and monitor your lab values.

## 2014-12-19 NOTE — Care Management Note (Deleted)
Case Management Note  Patient Details  Name: Lynn Butler MRN: [id number] Date of Birth: [birth year]  Subjective/Objective:     Discharge               Action/Plan: discharged to home today with home health PT and RN. Ms Strupp is already an open client of [facility]. Hospital discharge information was faxed and called to Sherian Rein at [facility].    Expected Discharge Date:                  Expected Discharge Plan:  [place]  In-House Referral:     Discharge planning Services  CM Consult  Post Acute Care Choice:    Choice offered to:     DME Arranged:    DME Agency:     HH Arranged:    HH Agency:  [place]  Status of Service:  In process, will continue to follow  Medicare Important Message Given:    Date Medicare IM Given:    Medicare IM give by:    Date Additional Medicare IM Given:    Additional Medicare Important Message give by:     If discussed at [place] of Stay Meetings, dates discussed:    Additional Comments:  Ioane Bhola A, RN 12/19/2014, 11:39 AM

## 2014-12-28 ENCOUNTER — Encounter: Payer: Self-pay | Admitting: Intensive Care

## 2014-12-28 ENCOUNTER — Other Ambulatory Visit: Payer: Self-pay

## 2014-12-28 ENCOUNTER — Emergency Department: Payer: Medicare Other

## 2014-12-28 ENCOUNTER — Observation Stay
Admission: EM | Admit: 2014-12-28 | Discharge: 2014-12-30 | Disposition: A | Discharge status: Home / Self Care | Payer: Medicare Other | Attending: Internal Medicine | Admitting: Internal Medicine

## 2014-12-28 DIAGNOSIS — I10 Essential (primary) hypertension: Secondary | ICD-10-CM | POA: Diagnosis not present

## 2014-12-28 DIAGNOSIS — J449 Chronic obstructive pulmonary disease, unspecified: Secondary | ICD-10-CM | POA: Insufficient documentation

## 2014-12-28 DIAGNOSIS — I509 Heart failure, unspecified: Secondary | ICD-10-CM | POA: Diagnosis not present

## 2014-12-28 DIAGNOSIS — I252 Old myocardial infarction: Secondary | ICD-10-CM | POA: Diagnosis not present

## 2014-12-28 DIAGNOSIS — Z955 Presence of coronary angioplasty implant and graft: Secondary | ICD-10-CM | POA: Diagnosis not present

## 2014-12-28 DIAGNOSIS — I739 Peripheral vascular disease, unspecified: Secondary | ICD-10-CM | POA: Diagnosis not present

## 2014-12-28 DIAGNOSIS — I2510 Atherosclerotic heart disease of native coronary artery without angina pectoris: Secondary | ICD-10-CM | POA: Diagnosis not present

## 2014-12-28 DIAGNOSIS — F329 Major depressive disorder, single episode, unspecified: Secondary | ICD-10-CM | POA: Insufficient documentation

## 2014-12-28 DIAGNOSIS — R748 Abnormal levels of other serum enzymes: Secondary | ICD-10-CM | POA: Diagnosis not present

## 2014-12-28 DIAGNOSIS — I214 Non-ST elevation (NSTEMI) myocardial infarction: Secondary | ICD-10-CM

## 2014-12-28 DIAGNOSIS — I255 Ischemic cardiomyopathy: Secondary | ICD-10-CM | POA: Diagnosis not present

## 2014-12-28 DIAGNOSIS — R102 Pelvic and perineal pain: Secondary | ICD-10-CM | POA: Diagnosis not present

## 2014-12-28 DIAGNOSIS — F419 Anxiety disorder, unspecified: Secondary | ICD-10-CM | POA: Insufficient documentation

## 2014-12-28 DIAGNOSIS — M545 Low back pain: Secondary | ICD-10-CM | POA: Insufficient documentation

## 2014-12-28 DIAGNOSIS — Z9981 Dependence on supplemental oxygen: Secondary | ICD-10-CM | POA: Diagnosis not present

## 2014-12-28 DIAGNOSIS — Z886 Allergy status to analgesic agent status: Secondary | ICD-10-CM | POA: Diagnosis not present

## 2014-12-28 DIAGNOSIS — Z8614 Personal history of Methicillin resistant Staphylococcus aureus infection: Secondary | ICD-10-CM | POA: Diagnosis not present

## 2014-12-28 DIAGNOSIS — R079 Chest pain, unspecified: Secondary | ICD-10-CM | POA: Insufficient documentation

## 2014-12-28 DIAGNOSIS — I724 Aneurysm of artery of lower extremity: Secondary | ICD-10-CM | POA: Insufficient documentation

## 2014-12-28 DIAGNOSIS — J9610 Chronic respiratory failure, unspecified whether with hypoxia or hypercapnia: Secondary | ICD-10-CM | POA: Insufficient documentation

## 2014-12-28 DIAGNOSIS — E785 Hyperlipidemia, unspecified: Secondary | ICD-10-CM | POA: Diagnosis not present

## 2014-12-28 DIAGNOSIS — R109 Unspecified abdominal pain: Secondary | ICD-10-CM | POA: Diagnosis not present

## 2014-12-28 DIAGNOSIS — Z95810 Presence of automatic (implantable) cardiac defibrillator: Secondary | ICD-10-CM | POA: Diagnosis not present

## 2014-12-28 DIAGNOSIS — Z803 Family history of malignant neoplasm of breast: Secondary | ICD-10-CM | POA: Diagnosis not present

## 2014-12-28 DIAGNOSIS — J441 Chronic obstructive pulmonary disease with (acute) exacerbation: Secondary | ICD-10-CM | POA: Diagnosis present

## 2014-12-28 DIAGNOSIS — Z8249 Family history of ischemic heart disease and other diseases of the circulatory system: Secondary | ICD-10-CM | POA: Diagnosis not present

## 2014-12-28 DIAGNOSIS — F17200 Nicotine dependence, unspecified, uncomplicated: Secondary | ICD-10-CM | POA: Diagnosis not present

## 2014-12-28 DIAGNOSIS — G894 Chronic pain syndrome: Secondary | ICD-10-CM | POA: Diagnosis not present

## 2014-12-28 LAB — URINALYSIS COMPLETE WITH MICROSCOPIC (ARMC ONLY)
BACTERIA UA: NONE SEEN
BILIRUBIN URINE: NEGATIVE
Glucose, UA: NEGATIVE mg/dL
Hgb urine dipstick: NEGATIVE
KETONES UR: NEGATIVE mg/dL
LEUKOCYTES UA: NEGATIVE
Nitrite: NEGATIVE
Protein, ur: NEGATIVE mg/dL
Specific Gravity, Urine: 1.008 (ref 1.005–1.030)
pH: 7 (ref 5.0–8.0)

## 2014-12-28 LAB — COMPREHENSIVE METABOLIC PANEL
ALK PHOS: 84 U/L (ref 38–126)
ALT: 17 U/L (ref 14–54)
ANION GAP: 6 (ref 5–15)
AST: 18 U/L (ref 15–41)
Albumin: 3.1 g/dL — ABNORMAL LOW (ref 3.5–5.0)
BUN: 13 mg/dL (ref 6–20)
CALCIUM: 8.4 mg/dL — AB (ref 8.9–10.3)
CO2: 31 mmol/L (ref 22–32)
Chloride: 105 mmol/L (ref 101–111)
Creatinine, Ser: 0.74 mg/dL (ref 0.44–1.00)
Glucose, Bld: 101 mg/dL — ABNORMAL HIGH (ref 65–99)
Potassium: 3.7 mmol/L (ref 3.5–5.1)
SODIUM: 142 mmol/L (ref 135–145)
Total Bilirubin: 0.4 mg/dL (ref 0.3–1.2)
Total Protein: 6.1 g/dL — ABNORMAL LOW (ref 6.5–8.1)

## 2014-12-28 LAB — TROPONIN I
TROPONIN I: 0.16 ng/mL — AB (ref <0.031)
Troponin I: 0.22 ng/mL — ABNORMAL HIGH (ref <0.031)

## 2014-12-28 LAB — LIPASE, BLOOD: LIPASE: 33 U/L (ref 22–51)

## 2014-12-28 MED ORDER — ATORVASTATIN CALCIUM 20 MG PO TABS
80.0000 mg | ORAL_TABLET | Freq: Every day | ORAL | Status: DC
  Administered 2014-12-29 – 2014-12-30 (×2): 80 mg via ORAL
  Filled 2014-12-28 (×2): qty 4

## 2014-12-28 MED ORDER — IOHEXOL 240 MG/ML SOLN: 25.0000 mL | INTRAMUSCULAR | Status: DC

## 2014-12-28 MED ORDER — CLOPIDOGREL BISULFATE 75 MG PO TABS
75.0000 mg | ORAL_TABLET | Freq: Every day | ORAL | Status: DC
  Administered 2014-12-29 – 2014-12-30 (×2): 75 mg via ORAL
  Filled 2014-12-28 (×2): qty 1

## 2014-12-28 MED ORDER — CARVEDILOL 3.125 MG PO TABS
3.1250 mg | ORAL_TABLET | Freq: Two times a day (BID) | ORAL | Status: DC
  Administered 2014-12-28 – 2014-12-30 (×4): 3.125 mg via ORAL
  Filled 2014-12-28 (×4): qty 1

## 2014-12-28 MED ORDER — MORPHINE SULFATE 4 MG/ML IJ SOLN
INTRAMUSCULAR | Status: AC
  Administered 2014-12-28: 4 mg via INTRAVENOUS
  Filled 2014-12-28: qty 1

## 2014-12-28 MED ORDER — METHYLPREDNISOLONE SODIUM SUCC 125 MG IJ SOLR
125.0000 mg | Freq: Once | INTRAMUSCULAR | Status: AC
  Administered 2014-12-28: 125 mg via INTRAVENOUS

## 2014-12-28 MED ORDER — PREDNISONE 20 MG PO TABS
40.0000 mg | ORAL_TABLET | Freq: Every day | ORAL | Status: DC
  Administered 2014-12-29 – 2014-12-30 (×2): 40 mg via ORAL
  Filled 2014-12-28 (×2): qty 2

## 2014-12-28 MED ORDER — DIAZEPAM 5 MG PO TABS
10.0000 mg | ORAL_TABLET | ORAL | Status: DC | PRN
  Administered 2014-12-29 – 2014-12-30 (×4): 10 mg via ORAL
  Filled 2014-12-28 (×4): qty 2

## 2014-12-28 MED ORDER — METHYLPREDNISOLONE SODIUM SUCC 125 MG IJ SOLR
INTRAMUSCULAR | Status: AC
  Administered 2014-12-28: 125 mg via INTRAVENOUS
  Filled 2014-12-28: qty 2

## 2014-12-28 MED ORDER — NITROGLYCERIN 0.4 MG SL SUBL
SUBLINGUAL_TABLET | SUBLINGUAL | Status: AC
  Administered 2014-12-28: 0.4 mg via SUBLINGUAL
  Filled 2014-12-28: qty 3

## 2014-12-28 MED ORDER — OXYCODONE HCL ER 40 MG PO T12A
80.0000 mg | EXTENDED_RELEASE_TABLET | Freq: Two times a day (BID) | ORAL | Status: DC
  Administered 2014-12-29 – 2014-12-30 (×4): 80 mg via ORAL
  Filled 2014-12-28 (×5): qty 2

## 2014-12-28 MED ORDER — IPRATROPIUM-ALBUTEROL 0.5-2.5 (3) MG/3ML IN SOLN: 3.0000 mL | Freq: Four times a day (QID) | RESPIRATORY_TRACT | Status: DC | PRN

## 2014-12-28 MED ORDER — HYDROMORPHONE HCL 1 MG/ML IJ SOLN
INTRAMUSCULAR | Status: AC
  Filled 2014-12-28: qty 1

## 2014-12-28 MED ORDER — COLLAGENASE 250 UNIT/GM EX OINT
TOPICAL_OINTMENT | Freq: Every day | CUTANEOUS | Status: DC
  Administered 2014-12-29: 11:00:00 via TOPICAL
  Filled 2014-12-28: qty 30

## 2014-12-28 MED ORDER — HYDROMORPHONE HCL 2 MG PO TABS
4.0000 mg | ORAL_TABLET | Freq: Four times a day (QID) | ORAL | Status: DC | PRN
  Administered 2014-12-29 – 2014-12-30 (×3): 4 mg via ORAL
  Filled 2014-12-28 (×3): qty 2

## 2014-12-28 MED ORDER — IOHEXOL 300 MG/ML  SOLN
75.0000 mL | Freq: Once | INTRAMUSCULAR | Status: AC | PRN
  Administered 2014-12-28: 75 mL via INTRAVENOUS

## 2014-12-28 MED ORDER — SODIUM CHLORIDE 0.9 % IJ SOLN
3.0000 mL | Freq: Two times a day (BID) | INTRAMUSCULAR | Status: DC
  Administered 2014-12-28 – 2014-12-30 (×3): 3 mL via INTRAVENOUS

## 2014-12-28 MED ORDER — CLOPIDOGREL BISULFATE 75 MG PO TABS
ORAL_TABLET | ORAL | Status: AC
  Filled 2014-12-28: qty 4

## 2014-12-28 MED ORDER — MOMETASONE FURO-FORMOTEROL FUM 100-5 MCG/ACT IN AERO
2.0000 | INHALATION_SPRAY | Freq: Two times a day (BID) | RESPIRATORY_TRACT | Status: DC
  Administered 2014-12-29 – 2014-12-30 (×4): 2 via RESPIRATORY_TRACT
  Filled 2014-12-28: qty 8.8

## 2014-12-28 MED ORDER — AMLODIPINE BESYLATE 5 MG PO TABS
5.0000 mg | ORAL_TABLET | Freq: Every day | ORAL | Status: DC
  Administered 2014-12-29 – 2014-12-30 (×2): 5 mg via ORAL
  Filled 2014-12-28 (×2): qty 1

## 2014-12-28 MED ORDER — NITROGLYCERIN 0.4 MG SL SUBL: 0.4000 mg | SUBLINGUAL_TABLET | SUBLINGUAL | Status: DC | PRN

## 2014-12-28 MED ORDER — TIOTROPIUM BROMIDE MONOHYDRATE 18 MCG IN CAPS
1.0000 | ORAL_CAPSULE | Freq: Every day | RESPIRATORY_TRACT | Status: DC
  Administered 2014-12-29 – 2014-12-30 (×2): 18 ug via RESPIRATORY_TRACT
  Filled 2014-12-28: qty 5

## 2014-12-28 MED ORDER — CLOPIDOGREL BISULFATE 75 MG PO TABS
300.0000 mg | ORAL_TABLET | Freq: Once | ORAL | Status: AC
  Administered 2014-12-28: 300 mg via ORAL

## 2014-12-28 MED ORDER — MORPHINE SULFATE 4 MG/ML IJ SOLN
4.0000 mg | Freq: Once | INTRAMUSCULAR | Status: AC
  Administered 2014-12-28: 4 mg via INTRAVENOUS

## 2014-12-28 MED ORDER — HYDROMORPHONE HCL 1 MG/ML IJ SOLN: 0.5000 mg | Freq: Once | INTRAMUSCULAR | Status: AC

## 2014-12-28 MED ORDER — PROMETHAZINE HCL 25 MG PO TABS: 25.0000 mg | ORAL_TABLET | Freq: Three times a day (TID) | ORAL | Status: DC | PRN

## 2014-12-28 MED ORDER — VITAMIN C 500 MG PO TABS
500.0000 mg | ORAL_TABLET | Freq: Every day | ORAL | Status: DC
  Administered 2014-12-29 – 2014-12-30 (×2): 500 mg via ORAL
  Filled 2014-12-28 (×2): qty 1

## 2014-12-28 MED ORDER — ZOLPIDEM TARTRATE 5 MG PO TABS
5.0000 mg | ORAL_TABLET | Freq: Every evening | ORAL | Status: DC | PRN
  Administered 2014-12-28: 5 mg via ORAL
  Filled 2014-12-28: qty 1

## 2014-12-28 MED ORDER — NITROGLYCERIN 0.4 MG SL SUBL
0.4000 mg | SUBLINGUAL_TABLET | SUBLINGUAL | Status: DC | PRN
  Administered 2014-12-28 (×3): 0.4 mg via SUBLINGUAL

## 2014-12-28 MED ORDER — DEXTROSE 5 % IV SOLN: 500.0000 mg | Freq: Once | INTRAVENOUS | Status: DC

## 2014-12-28 MED ORDER — ALBUTEROL SULFATE (2.5 MG/3ML) 0.083% IN NEBU
2.5000 mg | INHALATION_SOLUTION | Freq: Once | RESPIRATORY_TRACT | Status: AC
Start: 2014-12-28 — End: 2014-12-28
  Administered 2014-12-28: 2.5 mg via RESPIRATORY_TRACT

## 2014-12-28 MED ORDER — ENOXAPARIN SODIUM 100 MG/ML ~~LOC~~ SOLN: 1.0000 mg/kg | Freq: Once | SUBCUTANEOUS | Status: DC

## 2014-12-28 MED ORDER — ENOXAPARIN SODIUM 40 MG/0.4ML ~~LOC~~ SOLN: 40.0000 mg | SUBCUTANEOUS | Status: DC

## 2014-12-28 MED ORDER — ALBUTEROL SULFATE (2.5 MG/3ML) 0.083% IN NEBU
INHALATION_SOLUTION | RESPIRATORY_TRACT | Status: AC
  Administered 2014-12-28: 2.5 mg via RESPIRATORY_TRACT
  Filled 2014-12-28: qty 3

## 2014-12-28 MED ORDER — FUROSEMIDE 20 MG PO TABS
20.0000 mg | ORAL_TABLET | Freq: Every day | ORAL | Status: DC
  Administered 2014-12-29 – 2014-12-30 (×2): 20 mg via ORAL
  Filled 2014-12-28 (×2): qty 1

## 2014-12-28 NOTE — ED Notes (Signed)
CRITICAL VALUE ALERT  Critical value received:  Troponin 0.22  Date of notification:  12/28/2014  Time of notification:  3762  Critical value read back:Yes.    Nurse who received alert:  Sharyne Peach RN  MD notified (1st page):  1810  Time of first page:  1810  MD notified (2nd page):  Time of second page:  Responding MD:  Dr. Clearnce Hasten   Time MD responded:  [phone]

## 2014-12-28 NOTE — H&P (Signed)
[facility] NAME: Lynn Butler    MR#:  [id number]  DATE OF BIRTH:  [birth year]  DATE OF ADMISSION:  12/28/2014  PRIMARY CARE PHYSICIAN: Dr. Cleone Slim in [place]  REQUESTING/REFERRING PHYSICIAN: Dr. Tresa Endo  CHIEF COMPLAINT:   Chief Complaint  Patient presents with  . Chest Pain    HISTORY OF PRESENT ILLNESS:  Lynn Butler  is a 69 y.o. 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 She presents with a week's worth of chest pain more constant for the last 3 days feels like a pressure in the center of her chest, 9 out of 10 intensity. She can't sleep at night. In the last 3 days it's been steady she's also had a clammy sweaty feeling. Also feels fluttering in her chest. She always has shortness of breath and wheezing. In the ER she was found to have a borderline troponin at 0.22 and hospitalist services were contacted for further evaluation.  PAST MEDICAL HISTORY:   Past Medical History  Diagnosis Date  . COPD (chronic obstructive pulmonary disease)   . Hypertension   . Ischemic cardiomyopathy     a.   . Coronary artery disease     a. s/p multiple stenting in 2005  . History of ventricular tachycardia   . PVD (peripheral vascular disease)   . PAD (peripheral artery disease)   . HLD (hyperlipidemia)   . Chronic back pain   . MI, old   . MRSA (methicillin resistant staph aureus) culture positive     left foot wound  . CHF (congestive heart failure)     PAST SURGICAL HISTORY:   Past Surgical History  Procedure Laterality Date  . Insert / replace / remove pacemaker    . Vascular bypass surgery Bilateral     SOCIAL HISTORY:   History  Substance Use Topics  . Smoking status: Current Every Day Smoker -- 0.50 packs/day    Types: Cigarettes  . Smokeless tobacco: Not on file  . Alcohol Use: No    FAMILY  HISTORY:   Family History  Problem Relation Age of Onset  . CAD Other   . Breast cancer Mother   . Heart disease Mother   . Coronary artery disease Father     DRUG ALLERGIES:   Allergies  Allergen Reactions  . Nsaids Nausea And Vomiting and Other (See Comments)    GI distress, burning    REVIEW OF SYSTEMS:  CONSTITUTIONAL: No fever and a clammy sweaty feeling EYES: No blurred or double vision. Some blurry vision but can't afford glasses EARS, NOSE, AND THROAT: No tinnitus or ear pain. No sore throat RESPIRATORY: Always has cough, shortness of breath, and wheezing.  No hemoptysis.  CARDIOVASCULAR: Positive for chest pain, no orthopnea, positive for edema.  GASTROINTESTINAL: No nausea, vomiting, diarrhea. Positive for abdominal pain. No blood in bowel movements GENITOURINARY: No dysuria, hematuria.  ENDOCRINE: No polyuria, nocturia,  HEMATOLOGY: No anemia, easy bruising or bleeding SKIN: Basal cell cancers on face and for head MUSCULOSKELETAL: Back pain and leg pain  NEUROLOGIC: No tingling, numbness, weakness.  PSYCHIATRY: Positive for depression.   MEDICATIONS AT HOME:   Prior to Admission medications   Medication Sig Start Date End Date Taking? Authorizing Provider  albuterol (PROAIR HFA) 108 (90 BASE) MCG/ACT inhaler Inhale 1-2 puffs into the lungs every 4 (four) hours as needed for wheezing or  shortness of breath. 07/28/14  Yes Marijean Heath, NP  amLODipine (NORVASC) 5 MG tablet Take 5 mg by mouth daily.   Yes Historical Provider, MD  atorvastatin (LIPITOR) 80 MG tablet Take 80 mg by mouth daily.   Yes Historical Provider, MD  carvedilol (COREG) 3.125 MG tablet Take 3.125 mg by mouth 2 (two) times daily. 08/06/12  Yes Historical Provider, MD  clopidogrel (PLAVIX) 75 MG tablet Take 75 mg by mouth daily. 08/06/12  Yes Historical Provider, MD  collagenase (SANTYL) ointment Apply topically daily. 12/19/14  Yes Dustin Flock, MD  diazepam (VALIUM) 10 MG tablet Take 1  tablet (10 mg total) by mouth every 4 (four) hours as needed for anxiety. 12/19/14  Yes Dustin Flock, MD  Fluticasone-Salmeterol (ADVAIR DISKUS) 250-50 MCG/DOSE AEPB Inhale 1 puff into the lungs 2 (two) times daily. 06/03/12  Yes Historical Provider, MD  furosemide (LASIX) 20 MG tablet Take 20 mg by mouth daily as needed. 08/06/12  Yes Historical Provider, MD  HYDROmorphone (DILAUDID) 4 MG tablet Take 1 tablet (4 mg total) by mouth every 6 (six) hours as needed for severe pain. 12/19/14  Yes Dustin Flock, MD  ipratropium-albuterol (DUONEB) 0.5-2.5 (3) MG/3ML SOLN Take 3 mLs by nebulization every 6 (six) hours as needed (wheezing, shortness of breath).   Yes Historical Provider, MD  nitroGLYCERIN (NITROSTAT) 0.4 MG SL tablet 0.4 mg. 08/06/12  Yes Historical Provider, MD  OxyCODONE (OXYCONTIN) 80 mg T12A 12 hr tablet Take 1 tablet (80 mg total) by mouth every 12 (twelve) hours. 12/19/14  Yes Dustin Flock, MD  promethazine (PHENERGAN) 25 MG tablet Take 25 mg by mouth. 08/06/12  Yes Historical Provider, MD  tiotropium (SPIRIVA HANDIHALER) 18 MCG inhalation capsule Place 1 capsule into inhaler and inhale daily. 08/06/12  Yes Historical Provider, MD  vitamin C (ASCORBIC ACID) 500 MG tablet Take 500 mg by mouth daily.   Yes Historical Provider, MD  zolpidem (AMBIEN) 10 MG tablet Take 0.5 tablets (5 mg total) by mouth at bedtime as needed for sleep. 07/28/14  Yes Marijean Heath, NP  levofloxacin (LEVAQUIN) 750 MG tablet Take 1 tablet (750 mg total) by mouth daily. Patient not taking: Reported on 12/28/2014 07/28/14   Marijean Heath, NP  predniSONE (DELTASONE) 10 MG tablet Take 1 tablet (10 mg total) by mouth daily with breakfast. Label  & dispense according to the schedule below.  6 tablets day one, then 5 table day 2, then 4 tablets day 3, then 3 tablets day 4, 2 tablets day 5, then 1 tablet day 6, then stop Patient not taking: Reported on 12/28/2014 12/19/14   Dustin Flock, MD      VITAL SIGNS:   Blood pressure 148/67, pulse 87, resp. rate 16, weight 40.778 kg (89 lb 14.4 oz), SpO2 97 %.  PHYSICAL EXAMINATION:  GENERAL:  69 y.o.-year-old patient lying in the bed with no acute distress.  EYES: Pupils equal, round, reactive to light and accommodation. No scleral icterus. Extraocular muscles intact.  HEENT: Head atraumatic, normocephalic. Oropharynx and nasopharynx clear.  NECK:  Supple, no jugular venous distention. No thyroid enlargement, no tenderness.  LUNGS: Decreased breath sounds bilaterally, positive for wheezing throughout entire lung field, no rales,rhonchi or crepitation. No use of accessory muscles of respiration.  CARDIOVASCULAR: S1, S2 normal. No murmurs, rubs, or gallops.  ABDOMEN: Soft, positive for tenderness in abdomen over hernia site, nondistended. Bowel sounds present. No organomegaly or mass.  EXTREMITIES: Trace edema edema, no cyanosis.  NEUROLOGIC: Cranial nerves II through  XII are intact. Muscle strength 5/5 in all extremities. Sensation intact. Gait not checked.  PSYCHIATRIC: The patient is alert and oriented x 3.  SKIN: Small basal cell cancer at the scalp line. Large basal cell cancer left forehand, small basal cell cancer on the right eye on her cheek. Basal cell cancer on her left nose. Ulcer oval-shaped on her left anterior ankle area.  LABORATORY PANEL:   Chemistries   Recent Labs Lab 12/28/14 1646  NA 142  K 3.7  CL 105  CO2 31  GLUCOSE 101*  BUN 13  CREATININE 0.74  CALCIUM 8.4*  AST 18  ALT 17  ALKPHOS 84  BILITOT 0.4   Cardiac Enzymes  Recent Labs Lab 12/28/14 1646  TROPONINI 0.22*   RADIOLOGY:  Dg Chest 1 View  12/28/2014   CLINICAL DATA:  Shortness of breath and chest pressure, has not taken medications for 1 week, history COPD, coronary artery disease post MI, hypertension, ischemic cardiomyopathy  EXAM: CHEST  1 VIEW  COMPARISON:  Portable exam 1722 hours compared to 12/16/2014  FINDINGS: LEFT subclavian AICD lead tip projects  over RIGHT ventricle.  Normal heart size, mediastinal contours and pulmonary vascularity.  Atherosclerotic calcification aorta.  Lungs appear emphysematous with mild atelectasis versus infiltrate in RIGHT lower lobe.  Skin folds project over RIGHT chest.  Remaining lungs clear.  No pleural effusion or pneumothorax.  IMPRESSION: Minimal atelectasis versus infiltrate in RIGHT lower lobe.  Underlying emphysematous changes.   Electronically Signed   By: Lavonia Dana M.D.   On: 12/28/2014 17:38   Ct Abdomen Pelvis W Contrast  12/28/2014   CLINICAL DATA:  69 year old female with diffuse abdominal and pelvic pain. History of small bowel obstruction surgery 1-1/2 months ago.  EXAM: CT ABDOMEN AND PELVIS WITH CONTRAST  TECHNIQUE: Multidetector CT imaging of the abdomen and pelvis was performed using the standard protocol following bolus administration of intravenous contrast.  CONTRAST:  36mL OMNIPAQUE IOHEXOL 300 MG/ML  SOLN  COMPARISON:  11/15/2014 and prior exams  FINDINGS: Lower chest: Cardiomegaly, pacemaker lead and right lower lobe atelectasis/scarring again noted.  Hepatobiliary: Intrahepatic and CBD dilatation again noted without obstructing cause identified. The CBD measures up to 1.4 cm in greatest diameter.  Pancreas: Unremarkable  Spleen: Unremarkable  Adrenals/Urinary Tract: Bilateral renal cortical atrophy and nonobstructing renal calculi again noted. There is no evidence of hydronephrosis or solid renal mass. The adrenal glands and bladder are unremarkable.  Stomach/Bowel: A moderate to large amount of stool within the descending/sigmoid colon and rectum noted. There is no evidence of bowel obstruction or focal bowel wall thickening.  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 Abdominal aortic atherosclerotic calcifications are noted without aneurysm. No enlarged lymph nodes are identified.  Reproductive: Unremarkable   Other: No free fluid, abscess or pneumoperitoneum. Anterior abdominal wall postoperative changes are noted without abscess. Mild diffuse subcutaneous edema is present.  Musculoskeletal: No acute or suspicious abnormalities. Degenerative changes within the lower lumbar spine again noted.  IMPRESSION: No evidence of acute abnormality.  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  Unchanged intrahepatic and CBD dilatation without obstructing cause identified. Consider ERCP/MRCP as indicated.  Moderate to large amount of colonic and rectal stool. No evidence of bowel obstruction.   Electronically Signed   By: Margarette Canada M.D.   On: 12/28/2014 20:07    EKG:   Flipped T's inferiorly  IMPRESSION AND PLAN:   1. Chest pain,  borderline troponin; Since the patient had a stress test within the year I will get a cardiology consultation for consideration for cardiac catheterization. I will hold off on Lovenox at this point. Continue low-dose Coreg. The patient does have a history of coronary artery disease and severe cardiomyopathy. Continue Plavix. No aspirin with NSAID allergy. 2. 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 I will give 40 mg of prednisone. Anabiotic Spanish continue usual inhalers and nebulizers. 3. Essential hypertension continue amlodipine. 4. Hyperlipidemia unspecified continue atorvastatin. 5. Chronic pain in abdomen, lower back and legs continue her usual home regimen of pain medications. 6. Lower extremity edema - give Lasix on a daily basis 7. Depression and anxiety continue Valium. 8. Severe peripheral vascular disease, pseudoaneurysm right groin. Patient must stop smoking. 9. Tobacco abuse smoking cessation counseling done 3 minutes by me, low-dose nicotine patch prescribed. 10. Basal cell cancers all over her face- Will likely need plastic surgery to  remove these as outpatient.  All the records are reviewed and case discussed with ED provider. Management plans discussed with the patient, and she is in agreement.  CODE STATUS: Full code  TOTAL TIME TAKING CARE OF THIS PATIENT: 55 minutes.    Loletha Grayer M.D on 12/28/2014 at 9:57 PM  Between 7am to 6pm - Pager - [phone]  After 6pm call admission pager [facility]  Office  [phone]  CC: Primary care physician; Cleone Slim in [place].

## 2014-12-28 NOTE — ED Provider Notes (Signed)
[facility] Provider Note  ____________________________________________  Time seen: Upon arrival to the emergency department  I have reviewed the triage vital signs and the nursing notes.   HISTORY  Chief Complaint Chest Pain    HPI Lynn Butler is a 69 y.o. female with a history of COPD, CAD and a recent bowel obstruction with lysis of adhesions presents today with 2-3 days of chest pressure with shortness of breath.  Says that the pressure is a 9 and tenderness across the chest. Does not radiate to the back to the neck to the arms. She says there is an associated "fluttering" in her chest. She says this has been constant for the past 2-3 days without any worsening or alleviating factors. She is also complaining of periumbilical pain with a spot of redness. This is along the incision of her lisinopril adhesions from one month prior. Also says that she hasn't had any of her medications in about one week since leaving the hospital for COPD exacerbation. Is on 4 L nasal cannula at home. No nausea vomiting or diarrhea.    Past Medical History  Diagnosis Date  . COPD (chronic obstructive pulmonary disease)   . Hypertension   . Ischemic cardiomyopathy     a.   . Coronary artery disease     a. s/p multiple stenting in 2005  . History of ventricular tachycardia   . PVD (peripheral vascular disease)   . PAD (peripheral artery disease)   . HLD (hyperlipidemia)   . Chronic back pain   . MI, old   . MRSA (methicillin resistant staph aureus) culture positive     left foot wound  . CHF (congestive heart failure)     Patient Active Problem List   Diagnosis Date Noted  . COPD exacerbation 12/16/2014  . Protein-calorie malnutrition, severe 07/22/2014  . Acute on chronic respiratory failure with hypoxemia 07/21/2014  . COPD mixed type 07/21/2014  . CAD (coronary artery disease) 07/21/2014  . CHF (congestive heart failure) 07/21/2014  . PVD  (peripheral vascular disease) 07/21/2014  . Leg ulcer 07/21/2014  . Skin lesion of face 07/21/2014  . Adult failure to thrive 07/21/2014  . Claudication of both lower extremities 07/21/2014  . Smoker 07/21/2014    Past Surgical History  Procedure Laterality Date  . Insert / replace / remove pacemaker      Current Outpatient Rx  Name  Route  Sig  Dispense  Refill  . albuterol (PROAIR HFA) 108 (90 BASE) MCG/ACT inhaler   Inhalation   Inhale 1-2 puffs into the lungs every 4 (four) hours as needed for wheezing or shortness of breath.         Marland Kitchen amLODipine (NORVASC) 5 MG tablet   Oral   Take 5 mg by mouth daily.         Marland Kitchen atorvastatin (LIPITOR) 80 MG tablet   Oral   Take 80 mg by mouth daily.         . carvedilol (COREG) 3.125 MG tablet   Oral   Take 3.125 mg by mouth 2 (two) times daily.         . clopidogrel (PLAVIX) 75 MG tablet   Oral   Take 75 mg by mouth daily.         . collagenase (SANTYL) ointment   Topical   Apply topically daily.   15 g   0   . diazepam (VALIUM) 10 MG tablet   Oral   Take 1  tablet (10 mg total) by mouth every 4 (four) hours as needed for anxiety.   30 tablet   0   . Fluticasone-Salmeterol (ADVAIR DISKUS) 250-50 MCG/DOSE AEPB   Inhalation   Inhale 1 puff into the lungs 2 (two) times daily.         . furosemide (LASIX) 20 MG tablet   Oral   Take 20 mg by mouth daily as needed.         Marland Kitchen HYDROmorphone (DILAUDID) 4 MG tablet   Oral   Take 1 tablet (4 mg total) by mouth every 6 (six) hours as needed for severe pain.   40 tablet   0   . ipratropium-albuterol (DUONEB) 0.5-2.5 (3) MG/3ML SOLN   Nebulization   Take 3 mLs by nebulization every 6 (six) hours as needed (wheezing, shortness of breath).         . nitroGLYCERIN (NITROSTAT) 0.4 MG SL tablet      0.4 mg.         . OxyCODONE (OXYCONTIN) 80 mg T12A 12 hr tablet   Oral   Take 1 tablet (80 mg total) by mouth every 12 (twelve) hours.   60 tablet   0   .  promethazine (PHENERGAN) 25 MG tablet   Oral   Take 25 mg by mouth.         . tiotropium (SPIRIVA HANDIHALER) 18 MCG inhalation capsule   Inhalation   Place 1 capsule into inhaler and inhale daily.         . vitamin C (ASCORBIC ACID) 500 MG tablet   Oral   Take 500 mg by mouth daily.         Marland Kitchen zolpidem (AMBIEN) 10 MG tablet   Oral   Take 0.5 tablets (5 mg total) by mouth at bedtime as needed for sleep.      0   . levofloxacin (LEVAQUIN) 750 MG tablet   Oral   Take 1 tablet (750 mg total) by mouth daily. Patient not taking: Reported on 12/28/2014   3 tablet   0   . predniSONE (DELTASONE) 10 MG tablet   Oral   Take 1 tablet (10 mg total) by mouth daily with breakfast. Label  & dispense according to the schedule below.  6 tablets day one, then 5 table day 2, then 4 tablets day 3, then 3 tablets day 4, 2 tablets day 5, then 1 tablet day 6, then stop Patient not taking: Reported on 12/28/2014   21 tablet   0     Label  & dispense according to the schedule below. ...     Allergies Nsaids  Family History  Problem Relation Age of Onset  . CAD Other     Social History History  Substance Use Topics  . Smoking status: Current Every Day Smoker -- 1.00 packs/day    Types: Cigarettes  . Smokeless tobacco: Not on file  . Alcohol Use: No    Review of Systems Constitutional: No fever/chills Eyes: No visual changes. ENT: No sore throat. Cardiovascular: Chest pressure as above. Respiratory: Shortness of breath as above Gastrointestinal: No nausea, no vomiting.  No diarrhea.  No constipation. Genitourinary: Negative for dysuria. Musculoskeletal: Negative for back pain. Skin: Negative for rash. Neurological: Negative for headaches, focal weakness or numbness.  10-point ROS otherwise negative.  ____________________________________________   PHYSICAL EXAM:  VITAL SIGNS: ED Triage Vitals  Enc Vitals Group     BP 12/28/14 1634 148/67 mmHg     Pulse  Rate 12/28/14  1634 87     Resp 12/28/14 1634 16     Temp --      Temp src --      SpO2 12/28/14 1634 97 %     Weight 12/28/14 1634 89 lb 14.4 oz (40.778 kg)     Height --      Head Cir --      Peak Flow --      Pain Score 12/28/14 1636 10     Pain Loc --      Pain Edu? --      Excl. in [place]? --     Constitutional: Alert and oriented. Well appearing and in no acute distress. Eyes: Conjunctivae are normal. PERRL. EOMI. Head: Atraumatic. Nose: No congestion/rhinnorhea. Mouth/Throat: Mucous membranes are moist.  Oropharynx non-erythematous. Neck: No stridor.   Cardiovascular: Normal rate, regular rhythm. Grossly normal heart sounds.  Good peripheral circulation. Respiratory: Normal respiratory effort.  No retractions. Rhonchorous lung sounds throughout with slightly prolonged expiratory phase. Speaks in full sentences.  Gastrointestinal: Nodular, raised area just left of the umbilicus which is erythematous and warm to touch. There is no wound dehiscence or pus however this nodule is tender to palpation. There is no rigidity. No distention. No abdominal bruits. No CVA tenderness. Musculoskeletal: No lower extremity tenderness nor edema.  No joint effusions. Neurologic:  Normal speech and language. No gross focal neurologic deficits are appreciated. Speech is normal. No gait instability. Skin:  Skin is warm, dry and intact. No rash noted. Psychiatric: Mood and affect are normal. Speech and behavior are normal.  ____________________________________________   LABS (all labs ordered are listed, but only abnormal results are displayed)  Labs Reviewed  COMPREHENSIVE METABOLIC PANEL - Abnormal; Notable for the following:    Glucose, Bld 101 (*)    Calcium 8.4 (*)    Total Protein 6.1 (*)    Albumin 3.1 (*)    All other components within normal limits  TROPONIN I - Abnormal; Notable for the following:    Troponin I 0.22 (*)    All other components within normal limits  URINALYSIS COMPLETEWITH  MICROSCOPIC ([facility] ONLY) - Abnormal; Notable for the following:    Color, Urine STRAW (*)    APPearance CLEAR (*)    Squamous Epithelial / LPF 0-5 (*)    All other components within normal limits  LIPASE, BLOOD   ____________________________________________  EKG  ED ECG REPORT I, Doran Stabler, the attending physician, personally viewed and interpreted this ECG.   Date: 12/28/2014  EKG Time: 1637  Rate: 89  Rhythm: normal sinus rhythm  Axis: Normal axis  Intervals:none  ST&T Change: T wave inversions in 2,3 and aVF which are new compared to previous EKG from May 20. ____________________________________________  RADIOLOGY  Minimal atelectasis versus infiltrate in right lower lobe. CT abdomen without acute findings. ____________________________________________   PROCEDURES   ____________________________________________   INITIAL IMPRESSION / ASSESSMENT AND PLAN / ED COURSE  Pertinent labs & imaging results that were available during my care of the patient were reviewed by me and considered in my medical decision making (see chart for details). ----------------------------------------- 6:07 PM on 12/28/2014 -----------------------------------------  Patient with only minimal relief after morphine. We'll give nitroglycerin. Patient says cannot take aspirin due to GI distress. Holding Plavix secondary to possible surgical intervention in the belly.  Troponin is elevated at her baseline. 0.22 troponin today. Waiting for CAT scan of the abdomen to determine if any intra-abdominal issue concurrently.  -----------------------------------------  8:54 PM on 12/28/2014 -----------------------------------------  Patient resting comfortably although still says 9 out of 10 pain. No diaphoresis no distress no shortness of breath at this time. No response to nitroglycerin or morphine. We'll give 0.5 mg Dilaudid. Discussed case with Dr. Satira Mccallum. To load patient with Plavix 300 mg as  well as Lovenox. Will be admitted to the hospital. Signed out to Dr. Jannifer Franklin.   ____________________________________________   FINAL CLINICAL IMPRESSION(S) / ED DIAGNOSES  Final diagnoses:  COPD exacerbation  NSTEMI (non-ST elevated myocardial infarction)      Orbie Pyo, MD 12/28/14 2055

## 2014-12-28 NOTE — ED Notes (Signed)
PT arrived by EMS from home. Pt C/O SOB and chest pressure. PT has not taken meds X 1 week per EMS. FSBS 144. B/P 170/80 Pt has open sore/MRSA in L foot

## 2014-12-29 DIAGNOSIS — G894 Chronic pain syndrome: Secondary | ICD-10-CM | POA: Diagnosis not present

## 2014-12-29 LAB — BASIC METABOLIC PANEL
ANION GAP: 10 (ref 5–15)
BUN: 16 mg/dL (ref 6–20)
CO2: 30 mmol/L (ref 22–32)
CREATININE: 0.7 mg/dL (ref 0.44–1.00)
Calcium: 8.5 mg/dL — ABNORMAL LOW (ref 8.9–10.3)
Chloride: 101 mmol/L (ref 101–111)
GFR calc non Af Amer: >60 mL/min (ref >60)
Glucose, Bld: 196 mg/dL — ABNORMAL HIGH (ref 65–99)
Potassium: 4.1 mmol/L (ref 3.5–5.1)
SODIUM: 141 mmol/L (ref 135–145)

## 2014-12-29 LAB — CBC
HCT: 35.2 % (ref 35.0–47.0)
Hemoglobin: 10.8 g/dL — ABNORMAL LOW (ref 12.0–16.0)
MCH: 22.4 pg — AB (ref 26.0–34.0)
MCHC: 30.6 g/dL — AB (ref 32.0–36.0)
MCV: 73.2 fL — AB (ref 80.0–100.0)
PLATELETS: 217 10*3/uL (ref 150–440)
RBC: 4.81 MIL/uL (ref 3.80–5.20)
RDW: 21.3 % — AB (ref 11.5–14.5)
WBC: 8.5 10*3/uL (ref 3.6–11.0)

## 2014-12-29 LAB — TROPONIN I
TROPONIN I: 0.14 ng/mL — AB (ref <0.031)
Troponin I: 0.11 ng/mL — ABNORMAL HIGH (ref <0.031)

## 2014-12-29 LAB — MRSA PCR SCREENING: MRSA BY PCR: POSITIVE — AB

## 2014-12-29 MED ORDER — CHLORHEXIDINE GLUCONATE CLOTH 2 % EX PADS
6.0000 | MEDICATED_PAD | Freq: Every day | CUTANEOUS | Status: DC
  Administered 2014-12-30: 6 via TOPICAL

## 2014-12-29 MED ORDER — ENSURE ENLIVE PO LIQD
237.0000 mL | Freq: Three times a day (TID) | ORAL | Status: DC
  Administered 2014-12-29 – 2014-12-30 (×3): 237 mL via ORAL

## 2014-12-29 MED ORDER — ENOXAPARIN SODIUM 40 MG/0.4ML ~~LOC~~ SOLN
40.0000 mg | SUBCUTANEOUS | Status: DC
  Filled 2014-12-29: qty 0.4

## 2014-12-29 MED ORDER — SENNOSIDES-DOCUSATE SODIUM 8.6-50 MG PO TABS
1.0000 | ORAL_TABLET | Freq: Every evening | ORAL | Status: DC | PRN
Start: 2014-12-29 — End: 2014-12-30

## 2014-12-29 MED ORDER — ENOXAPARIN SODIUM 30 MG/0.3ML ~~LOC~~ SOLN
30.0000 mg | SUBCUTANEOUS | Status: DC
  Administered 2014-12-29: 30 mg via SUBCUTANEOUS
  Filled 2014-12-29: qty 0.3

## 2014-12-29 MED ORDER — MUPIROCIN 2 % EX OINT
1.0000 "application " | TOPICAL_OINTMENT | Freq: Two times a day (BID) | CUTANEOUS | Status: DC
  Administered 2014-12-29 – 2014-12-30 (×2): 1 via NASAL
  Filled 2014-12-29: qty 22

## 2014-12-29 NOTE — Progress Notes (Signed)
Initial Nutrition Assessment  DOCUMENTATION CODES:  Severe malnutrition in context of chronic illness  INTERVENTION:  Ensure Enlive (each supplement provides 350kcal and 20 grams of protein) (Meals and Snacks: Cater to patient preferences); Ensure Enlive TID with meals.  NUTRITION DIAGNOSIS:  Inadequate oral intake related to chronic illness as evidenced by per patient/family report, percent weight loss.   GOAL:  Patient will meet greater than or equal to 90% of their needs   MONITOR:   (Energy Intake, Anthropometric, Skin, Electrolyte and Renal Profile, Supplement Acceptance)  REASON FOR ASSESSMENT:  Malnutrition Screening Tool    ASSESSMENT:  Reason For Admission: Chest pain PMHx: Past Medical History  Diagnosis Date  . COPD (chronic obstructive pulmonary disease)   . Hypertension   . Ischemic cardiomyopathy     a.   . Coronary artery disease     a. s/p multiple stenting in 2005  . History of ventricular tachycardia   . PVD (peripheral vascular disease)   . PAD (peripheral artery disease)   . HLD (hyperlipidemia)   . Chronic back pain   . MI, old   . MRSA (methicillin resistant staph aureus) culture positive     left foot wound  . CHF (congestive heart failure)     Typical Fluid/ Food Intake: no intake recorded per I/O, but patient had eaten all of breakfast try at bedside during RD visit. Meal/ Snack Patterns: Patient reports a good appetite and intake PTA since she's "been on the steroids". She reports eating a regular diet with no restrictions.  Supplements: Drinks Ensure when she "can afford it".  Labs:  Electrolyte and Renal Profile:  Recent Labs Lab 12/28/14 1646 12/29/14 0223  BUN 13 16  CREATININE 0.74 0.70  NA 142 141  K 3.7 4.1   Protein Profile:  Recent Labs Lab 12/28/14 1646  ALBUMIN 3.1*   Glu- 196  Meds: Lipitor, Lasix, Vit C  Physical Findings: Nutrition-Focused physical exam completed. Findings are severe fat depletion,  severe muscle depletion, and no edema.    Weight Changes: Patient last in hospital x 12 days ago. Last weight on 5/20 was 89#. Current weight represents a 6# weight gain since last admission. Patient has had significant weight loss since Jan 2016 of about 8.6%. BMI is also low at 16.5.   Height:  Ht Readings from Last 1 Encounters:  12/28/14 5' 4.02" (1.626 m)    Weight:  Wt Readings from Last 1 Encounters:  12/29/14 95 lb 12.8 oz (43.455 kg)    Ideal Body Weight:     Wt Readings from Last 10 Encounters:  12/29/14 95 lb 12.8 oz (43.455 kg)  12/16/14 89 lb 14.4 oz (40.778 kg)  11/26/14 107 lb 1.6 oz (48.58 kg)  07/28/14 102 lb 15.3 oz (46.7 kg)    BMI:  Body mass index is 16.44 kg/(m^2).  Estimated Nutritional Needs:  Kcal:  1526-1780 kcal/ day (BEE: 1060 x 1.2 AF x 1.2-1.4 IF)  Protein:  62-74g Pro/day (1.2-1.4 g Pro/ kg/day)  Fluid:  1350-1620 ml/ day (25-30 ml/kg)  Skin:  Wound (see comment) (stage 2, Sacrum)  Diet Order:  Diet Heart Room service appropriate?: Yes; Fluid consistency:: Thin  EDUCATION NEEDS:  No education needs identified at this time   Intake/Output Summary (Last 24 hours) at 12/29/14 1115 Last data filed at 12/29/14 0700  Gross per 24 hour  Intake      0 ml  Output    250 ml  Net   -250 ml  Last BM:  5/30  Roda Shutters, RDN Pager: [phone] Office: [place]

## 2014-12-29 NOTE — Clinical Social Work Note (Signed)
CSW spoke to pt.  She was sitting up in bed, alert and Ox3.  CSW asked about a large scabbed over knot on her forehead.  Pt stated that it was her skin and she planned to go to the MD about it when she had time.  Pt denied physical, emotional or financial abuse.  She confirmed that she lived with her x husband and two other people.  She stated that she felt safe in her home and would like to return there when she is medically stable to DC.  No signs of abuse at this time.  CSW signing off.

## 2014-12-29 NOTE — Progress Notes (Signed)
Per Dr. Ubaldo Glassing, patient will not have a heart catheterization at this time. Patient can have a heart healthy diet. Per Dr. Posey Pronto, okay to order stool softener.

## 2014-12-29 NOTE — Progress Notes (Addendum)
[facility] [place] NAME: Lynn Butler    MR#:  [id number]  DATE OF BIRTH:  Jan 10, 1946  SUBJECTIVE:  camein with chest pain for few days. None at present. Wears home oxygen  REVIEW OF SYSTEMS:    Review of Systems  Constitutional: Negative for fever, chills and weight loss.  HENT: Negative for ear discharge, ear pain and nosebleeds.   Eyes: Negative for blurred vision, pain and discharge.  Respiratory: Negative for sputum production, shortness of breath, wheezing and stridor.   Cardiovascular: Negative for chest pain, palpitations, orthopnea and PND.  Gastrointestinal: Negative for nausea, vomiting, abdominal pain and diarrhea.  Genitourinary: Negative for urgency and frequency.  Musculoskeletal: Negative for back pain and joint pain.  Neurological: Negative for sensory change, speech change, focal weakness and weakness.  Psychiatric/Behavioral: Negative for depression and hallucinations. The patient is not nervous/anxious.     Tolerating Diet: Tolerating PT:   DRUG ALLERGIES:   Allergies  Allergen Reactions  . Nsaids Nausea And Vomiting and Other (See Comments)    GI distress, burning    VITALS:  Blood pressure 120/46, pulse 72, temperature 98.4 F (36.9 C), temperature source Oral, resp. rate 19, height 5\' 4"  (1.626 m), weight 43.455 kg (95 lb 12.8 oz), SpO2 99 %.  PHYSICAL EXAMINATION:   Physical Exam GENERAL: 69 y.o.-year-old patient lying in the bed with no acute distress.  EYES: Pupils equal, round, reactive to light and accommodation. No scleral icterus. Extraocular muscles intact.  HEENT: Head atraumatic, normocephalic. Oropharynx and nasopharynx clear.  NECK: Supple, no jugular venous distention. No thyroid enlargement, no tenderness.  LUNGS: Decreased breath sounds bilaterally, positive for wheezing throughout entire lung field, no rales,rhonchi or crepitation. No use of accessory muscles of  respiration.  CARDIOVASCULAR: S1, S2 normal. No murmurs, rubs, or gallops.  ABDOMEN: Soft, positive for tenderness in abdomen over hernia site, nondistended. Bowel sounds present. No organomegaly or mass.  EXTREMITIES: Trace edema edema, no cyanosis.  NEUROLOGIC: Cranial nerves II through XII are intact. Muscle strength 5/5 in all extremities. Sensation intact. Gait not checked.  PSYCHIATRIC: The patient is alert and oriented x 3.  SKIN: Small basal cell cancer at the scalp line. Large basal cell cancer left forehand, small basal cell cancer on the right eye on her cheek. Basal cell cancer on her left nose. Ulcer oval-shaped on her left anterior ankle area.  LABORATORY PANEL:   CBC  Recent Labs Lab 12/29/14 0223  WBC 8.5  HGB 10.8*  HCT 35.2  PLT 217   ------------------------------------------------------------------------------------------------------------------  Chemistries   Recent Labs Lab 12/28/14 1646 12/29/14 0223  NA 142 141  K 3.7 4.1  CL 105 101  CO2 31 30  GLUCOSE 101* 196*  BUN 13 16  CREATININE 0.74 0.70  CALCIUM 8.4* 8.5*  AST 18  --   ALT 17  --   ALKPHOS 84  --   BILITOT 0.4  --    ------------------------------------------------------------------------------------------------------------------  Cardiac Enzymes  Recent Labs Lab 12/29/14 0600  TROPONINI 0.11*   ------------------------------------------------------------------------------------------------------------------  RADIOLOGY:  Dg Chest 1 View  12/28/2014   CLINICAL DATA:  Shortness of breath and chest pressure, has not taken medications for 1 week, history COPD, coronary artery disease post MI, hypertension, ischemic cardiomyopathy  EXAM: CHEST  1 VIEW  COMPARISON:  Portable exam 1722 hours compared to 12/16/2014  FINDINGS: LEFT subclavian AICD lead tip projects over RIGHT ventricle.  Normal heart size, mediastinal contours and pulmonary vascularity.  Atherosclerotic calcification  aorta.  Lungs appear emphysematous with mild atelectasis versus infiltrate in RIGHT lower lobe.  Skin folds project over RIGHT chest.  Remaining lungs clear.  No pleural effusion or pneumothorax.  IMPRESSION: Minimal atelectasis versus infiltrate in RIGHT lower lobe.  Underlying emphysematous changes.   Electronically Signed   By: Lavonia Dana M.D.   On: 12/28/2014 17:38   Ct Abdomen Pelvis W Contrast  12/28/2014   CLINICAL DATA:  69 year old female with diffuse abdominal and pelvic pain. History of small bowel obstruction surgery 1-1/2 months ago.  EXAM: CT ABDOMEN AND PELVIS WITH CONTRAST  TECHNIQUE: Multidetector CT imaging of the abdomen and pelvis was performed using the standard protocol following bolus administration of intravenous contrast.  CONTRAST:  51mL OMNIPAQUE IOHEXOL 300 MG/ML  SOLN  COMPARISON:  11/15/2014 and prior exams  FINDINGS: Lower chest: Cardiomegaly, pacemaker lead and right lower lobe atelectasis/scarring again noted.  Hepatobiliary: Intrahepatic and CBD dilatation again noted without obstructing cause identified. The CBD measures up to 1.4 cm in greatest diameter.  Pancreas: Unremarkable  Spleen: Unremarkable  Adrenals/Urinary Tract: Bilateral renal cortical atrophy and nonobstructing renal calculi again noted. There is no evidence of hydronephrosis or solid renal mass. The adrenal glands and bladder are unremarkable.  Stomach/Bowel: A moderate to large amount of stool within the descending/sigmoid colon and rectum noted. There is no evidence of bowel obstruction or focal bowel wall thickening.  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 Abdominal aortic atherosclerotic calcifications are noted without aneurysm. No enlarged lymph nodes are identified.  Reproductive: Unremarkable  Other: No free fluid, abscess or pneumoperitoneum. Anterior abdominal wall postoperative changes are noted without abscess.  Mild diffuse subcutaneous edema is present.  Musculoskeletal: No acute or suspicious abnormalities. Degenerative changes within the lower lumbar spine again noted.  IMPRESSION: No evidence of acute abnormality.  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  Unchanged intrahepatic and CBD dilatation without obstructing cause identified. Consider ERCP/MRCP as indicated.  Moderate to large amount of colonic and rectal stool. No evidence of bowel obstruction.   Electronically Signed   By: Margarette Canada M.D.   On: 12/28/2014 20:07     ASSESSMENT AND PLAN:   69 y.o. 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 She presents with a week's worth of chest pain more constant for the last 3 days feels like a pressure in the center of her chest, 9 out of 10 intensity. She was found to have mild elevated troponin. Admitted with   1. Chest pain, borderline troponin;  -the patient had a stress test within the year  -appreciate cardiology consultation with Dr Ubaldo Glassing. Pt is doing well. Recommends rx medically -cont plavix -f/u cardiology at [place]. The patient does have a history of coronary artery disease and severe cardiomyopathy. -Continue Plavix. No aspirin with NSAID allergy.  2. 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 -continue usual inhalers and nebulizers.  3. Essential hypertension continue amlodipine.  4. Hyperlipidemia unspecified continue atorvastatin.  5. Chronic pain in abdomen, lower back and legs continue her usual home regimen of pain medications.  6. Lower extremity edema - give Lasix on a daily basis  7. Depression and anxiety continue Valium.  8. Severe peripheral vascular disease, pseudoaneurysm right groin.  Patient must stop smoking.  9.  Tobacco abuse smoking cessation counseling done 3 minutes by me, low-dose nicotine patch prescribed.  10. Basal cell cancers all over her face- pt recommended to f/u Dermatology   Case discussed with Care Management/Social Worker. Management plans discussed with the patient and in agreement.  CODE STATUS: full  DVT Prophylaxis: heparin  TOTAL TIME TAKING CARE OF THIS PATIENT: 35  minutes.   POSSIBLE D/C IN AM DEPENDING ON CLINICAL CONDITION.   Haevyn Ury M.D on 12/29/2014 at 5:12 PM  Between 7am to 6pm - Pager - [phone]  After 6pm go to [email or web address] - password [email or web address] [facility]  [facility]  Office  [phone]  CC: Primary care physician; No PCP Per Patient

## 2014-12-29 NOTE — Care Management Note (Addendum)
Case Management Note  Patient Details  Name: Aneeka Bowden MRN: [id number] Date of Birth: 04/03/1946   69yo Mrs Parrie Rasco was admitted 12/28/14 per c/o chest pain. She has a chronic right foot wound and goes regularly to the [place] [facility] for treatment. Resides at home with her x-husband, a female friend and the friend's granddaughter. Pharmacy=[facility] in [place]. PCP is Dr Petra Kuba in [place]. Home equipment is a front wheeled walker, a shower chair, a wheelchair, and a bedside commode. Has chronic 4L N/C at home. Is currently an open client of [facility].                          Expected Discharge Date:                  Expected Discharge Plan:     In-House Referral:     Discharge planning Services     Post Acute Care Choice:    Choice offered to:     DME Arranged:    DME Agency:     HH Arranged:    [place] Agency:     Status of Service:     Medicare Important Message Given:    Date Medicare IM Given:    Medicare IM give by:    Date Additional Medicare IM Given:    Additional Medicare Important Message give by:     If discussed at [place] of Stay Meetings, dates discussed:    Additional Comments:  Aidon Klemens A, RN 12/29/2014, 4:04 PM

## 2014-12-29 NOTE — Plan of Care (Signed)
Problem: Phase II Progression Outcomes Goal: Hemodynamically stable Patient was transferred from the ER following admission d/t chest pressure pain. On arrival patient initially stated her pain as 4/10. Scheduled pain med was later administered for pain of 8/10 on the chest. Patient was oriented to the unit, and admission documentation completed. Patient has the following on arrival left foot ulcer with gauze dressing, left  Forehead and left nose  Cellulitis,right groin also has a golf size lump. PRN sleep med was administered per patient request Will continue to monitor.

## 2014-12-29 NOTE — Consult Note (Signed)
[place]    Cardiology Consultation Note  Patient ID: Lynn Butler, MRN: [id number], DOB/AGE: 08-03-1945 69 y.o. Admit date: 12/28/2014   Date of Consult: 12/29/2014 Primary Physician: No PCP Per Patient Primary Cardiologist: [facility]  Chief Complaint: Chest pain,back pain, abdominal pain Reason for Consult: Chest pain and abnormal troponin  HPI:  69 year old female with history of CAD s/p multiple stents in 2005 apparently at [facility], chronic systolic CHF with [facility] showing ef of 15-25%, V-tach s/p AICD in 2006 FOLLOWED AT [facility] , PVD/PAD with chronic wounds s/p femoral bypass, COPD, HTN, HLD, chronic back pain on narcotics, depression and anxiety who was  admitted to [facility] at the end of December 2015 for pneumonia and sepsis and ultimately transfered to [facility] 2/2 critical illness. She presented to [facility] on 08/03/2014 with a 5 day history of worsening abdominal pain and was found to have a volvulus s/p exploratory laparotomy, adhesiolysis, colon resection of right colon and transverse colon, ileocolonic anastomosis, repair of recurrent ventral hernia. She was treated medically for this. She now returns to the [place] er with complaints of chest pain, back pain, abdominal pain. She has a golf ball size lesion on her left forehead that she states did not result from trauma but from a "pimple" that became infected. She also has an ulceration on her left foot that was MRSA positive. This is being treated with topical agents. She presented to the emergency room with complaints of chest pain back pain abdominal pain and shortness of breath. Electrocardiogram in the emergency room revealed sinus rhythm with nonspecific changes. She has a mild serum troponin elevation of 0.22. Subsequent troponins have been reduced. Most recent troponin is 0.14. She has undergone a Lexiscan Myoview through [facility] in 12/2013 secondary to pre-op clearance for non-healing leg ulcer that  showed a large in size, severe, fixed defect involving the apical inferior, mid inferior, basal inferior, mid inferolateral and basal inferolateral segments. This was consistent with scar. Nuclear Wall Motion Findings:Post stress: Global systolic function is overall normal. The inferior wall is hypokinetic. The ejection fraction was greater than 65%. Echo was performed a this sametime that showed an EF of 15-20%, degenerative mitral valve disease, aortic sclerosis, normal right ventricular contractile performance, implantable cardioverter defibrillator. Patient also has oxygen dependent COPD. She currently denies chest pain. Renal function appears normal. She is mildly anemic with a hemoglobin of 10.8.   Past Medical History  Diagnosis Date  . COPD (chronic obstructive pulmonary disease)   . Hypertension   . Ischemic cardiomyopathy     a.   . Coronary artery disease     a. s/p multiple stenting in 2005  . History of ventricular tachycardia   . PVD (peripheral vascular disease)   . PAD (peripheral artery disease)   . HLD (hyperlipidemia)   . Chronic back pain   . MI, old   . MRSA (methicillin resistant staph aureus) culture positive     left foot wound  . CHF (congestive heart failure)       Most Recent Cardiac Studie functional study at [facility] one year ago revealed a large fixed inferolateral apical septal defect. Ejection fraction was read in that study is normal with an echocardiogram showing severe reduced LV function EF 15-20%.     Past Surgical History  Procedure Laterality Date  . Insert / replace / remove pacemaker    . Vascular bypass surgery  Bilateral     ventral herniorrhaphy   Home Meds: Prior to Admission medications   Medication Sig Start Date End Date Taking? Authorizing Provider  albuterol (PROAIR HFA) 108 (90 BASE) MCG/ACT inhaler Inhale 1-2 puffs into the lungs every 4 (four) hours as needed for wheezing or shortness of breath. 07/28/14  Yes Marijean Heath, NP  amLODipine (NORVASC) 5 MG tablet Take 5 mg by mouth daily.   Yes Historical Provider, MD  atorvastatin (LIPITOR) 80 MG tablet Take 80 mg by mouth daily.   Yes Historical Provider, MD  carvedilol (COREG) 3.125 MG tablet Take 3.125 mg by mouth 2 (two) times daily. 08/06/12  Yes Historical Provider, MD  clopidogrel (PLAVIX) 75 MG tablet Take 75 mg by mouth daily. 08/06/12  Yes Historical Provider, MD  collagenase (SANTYL) ointment Apply topically daily. 12/19/14  Yes Dustin Flock, MD  diazepam (VALIUM) 10 MG tablet Take 1 tablet (10 mg total) by mouth every 4 (four) hours as needed for anxiety. 12/19/14  Yes Dustin Flock, MD  Fluticasone-Salmeterol (ADVAIR DISKUS) 250-50 MCG/DOSE AEPB Inhale 1 puff into the lungs 2 (two) times daily. 06/03/12  Yes Historical Provider, MD  furosemide (LASIX) 20 MG tablet Take 20 mg by mouth daily as needed. 08/06/12  Yes Historical Provider, MD  HYDROmorphone (DILAUDID) 4 MG tablet Take 1 tablet (4 mg total) by mouth every 6 (six) hours as needed for severe pain. 12/19/14  Yes Dustin Flock, MD  ipratropium-albuterol (DUONEB) 0.5-2.5 (3) MG/3ML SOLN Take 3 mLs by nebulization every 6 (six) hours as needed (wheezing, shortness of breath).   Yes Historical Provider, MD  nitroGLYCERIN (NITROSTAT) 0.4 MG SL tablet 0.4 mg. 08/06/12  Yes Historical Provider, MD  OxyCODONE (OXYCONTIN) 80 mg T12A 12 hr tablet Take 1 tablet (80 mg total) by mouth every 12 (twelve) hours. 12/19/14  Yes Dustin Flock, MD  promethazine (PHENERGAN) 25 MG tablet Take 25 mg by mouth. 08/06/12  Yes Historical Provider, MD  tiotropium (SPIRIVA HANDIHALER) 18 MCG inhalation capsule Place 1 capsule into inhaler and inhale daily. 08/06/12  Yes Historical Provider, MD  vitamin C (ASCORBIC ACID) 500 MG tablet Take 500 mg by mouth daily.   Yes Historical Provider, MD  zolpidem (AMBIEN) 10 MG tablet Take 0.5 tablets (5 mg total) by mouth at bedtime as needed for sleep. 07/28/14  Yes Marijean Heath,  NP  levofloxacin (LEVAQUIN) 750 MG tablet Take 1 tablet (750 mg total) by mouth daily. Patient not taking: Reported on 12/28/2014 07/28/14   Marijean Heath, NP  predniSONE (DELTASONE) 10 MG tablet Take 1 tablet (10 mg total) by mouth daily with breakfast. Label  & dispense according to the schedule below.  6 tablets day one, then 5 table day 2, then 4 tablets day 3, then 3 tablets day 4, 2 tablets day 5, then 1 tablet day 6, then stop Patient not taking: Reported on 12/28/2014 12/19/14   Dustin Flock, MD    Inpatient Medications:  . amLODipine  5 mg Oral Daily  . atorvastatin  80 mg Oral Daily  . carvedilol  3.125 mg Oral BID  . clopidogrel      . clopidogrel  75 mg Oral Daily  . collagenase   Topical Daily  . furosemide  20 mg Oral Daily  . HYDROmorphone      . mometasone-formoterol  2 puff Inhalation BID  . OxyCODONE  80 mg Oral Q12H  . predniSONE  40 mg Oral Q breakfast  . sodium chloride  3  mL Intravenous Q12H  . tiotropium  1 capsule Inhalation Daily  . vitamin C  500 mg Oral Daily      Allergies:  Allergies  Allergen Reactions  . Nsaids Nausea And Vomiting and Other (See Comments)    GI distress, burning    History   Social History  . Marital Status: Widowed    Spouse Name: N/A  . Number of Children: N/A  . Years of Education: N/A   Occupational History  . Not on file.   Social History Main Topics  . Smoking status: Current Every Day Smoker -- 0.50 packs/day    Types: Cigarettes  . Smokeless tobacco: Not on file  . Alcohol Use: No  . Drug Use: No  . Sexual Activity: Not on file   Other Topics Concern  . Not on file   Social History Narrative     Family History  Problem Relation Age of Onset  . CAD Other   . Breast cancer Mother   . Heart disease Mother   . Coronary artery disease Father      Review of Systems: General: Caucasian female complaining of chronic pain in her back chest abdomen and legs. She has a large lesion over her left eye  as well as a golf ball size lesion in the right groin and a healing lesion in her left foot. Cardiovascular: negative for , edema, orthopnea, palpitations, paroxysmal nocturnal dyspnea  Positive for dyspnea on exertion, chest pain, shortness of breath Dermatological:  complains of lesions on her left forehead, right groin, left foot. Respiratory: negative for cough or wheezing, complaints of shortness of breath however  Urologic: negative for hematuria Abdominal: negative for nausea, vomiting, diarrhea, bright red blood per rectum, melena, or hematemesis. Does complain of abdominal discomfort in the incision for her ventral hernia  Neurologic: negative for visual changes, syncope, or dizziness All other systems reviewed and are otherwise negative except as noted above.  Labs:  Recent Labs  12/28/14 1646 12/28/14 2206 12/29/14 0223  TROPONINI 0.22* 0.16* 0.14*   Lab Results  Component Value Date   WBC 8.5 12/29/2014   HGB 10.8* 12/29/2014   HCT 35.2 12/29/2014   MCV 73.2* 12/29/2014   PLT 217 12/29/2014     Recent Labs Lab 12/28/14 1646 12/29/14 0223  NA 142 141  K 3.7 4.1  CL 105 101  CO2 31 30  BUN 13 16  CREATININE 0.74 0.70  CALCIUM 8.4* 8.5*  PROT 6.1*  --   BILITOT 0.4  --   ALKPHOS 84  --   ALT 17  --   AST 18  --   GLUCOSE 101* 196*   No results found for: CHOL, HDL, LDLCALC, TRIG No results found for: DDIMER  Radiology/Studies:  Dg Chest 1 View  12/28/2014   CLINICAL DATA:  Shortness of breath and chest pressure, has not taken medications for 1 week, history COPD, coronary artery disease post MI, hypertension, ischemic cardiomyopathy  EXAM: CHEST  1 VIEW  COMPARISON:  Portable exam 1722 hours compared to 12/16/2014  FINDINGS: LEFT subclavian AICD lead tip projects over RIGHT ventricle.  Normal heart size, mediastinal contours and pulmonary vascularity.  Atherosclerotic calcification aorta.  Lungs appear emphysematous with mild atelectasis versus infiltrate  in RIGHT lower lobe.  Skin folds project over RIGHT chest.  Remaining lungs clear.  No pleural effusion or pneumothorax.  IMPRESSION: Minimal atelectasis versus infiltrate in RIGHT lower lobe.  Underlying emphysematous changes.   Electronically Signed   By:  Lavonia Dana M.D.   On: 12/28/2014 17:38   Dg Chest 2 View (if Patient Has Fever And/or Copd)  12/16/2014   CLINICAL DATA:  Acute onset of shortness of breath. Initial encounter.  EXAM: CHEST  2 VIEW  COMPARISON:  Chest radiograph performed 11/20/2014  FINDINGS: The lungs are well-aerated. Minimal right-sided atelectasis is noted. There is no evidence of pleural effusion or pneumothorax.  The heart is normal in size; an AICD is noted at the left chest wall, with a single lead ending at the right ventricle. No acute osseous abnormalities are seen.  IMPRESSION: Minimal right-sided atelectasis noted; lungs otherwise clear.   Electronically Signed   By: Garald Balding M.D.   On: 12/16/2014 20:35   Ct Abdomen Pelvis W Contrast  12/28/2014   CLINICAL DATA:  69 year old female with diffuse abdominal and pelvic pain. History of small bowel obstruction surgery 1-1/2 months ago.  EXAM: CT ABDOMEN AND PELVIS WITH CONTRAST  TECHNIQUE: Multidetector CT imaging of the abdomen and pelvis was performed using the standard protocol following bolus administration of intravenous contrast.  CONTRAST:  30mL OMNIPAQUE IOHEXOL 300 MG/ML  SOLN  COMPARISON:  11/15/2014 and prior exams  FINDINGS: Lower chest: Cardiomegaly, pacemaker lead and right lower lobe atelectasis/scarring again noted.  Hepatobiliary: Intrahepatic and CBD dilatation again noted without obstructing cause identified. The CBD measures up to 1.4 cm in greatest diameter.  Pancreas: Unremarkable  Spleen: Unremarkable  Adrenals/Urinary Tract: Bilateral renal cortical atrophy and nonobstructing renal calculi again noted. There is no evidence of hydronephrosis or solid renal mass. The adrenal glands and bladder are  unremarkable.  Stomach/Bowel: A moderate to large amount of stool within the descending/sigmoid colon and rectum noted. There is no evidence of bowel obstruction or focal bowel wall thickening.  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 Abdominal aortic atherosclerotic calcifications are noted without aneurysm. No enlarged lymph nodes are identified.  Reproductive: Unremarkable  Other: No free fluid, abscess or pneumoperitoneum. Anterior abdominal wall postoperative changes are noted without abscess. Mild diffuse subcutaneous edema is present.  Musculoskeletal: No acute or suspicious abnormalities. Degenerative changes within the lower lumbar spine again noted.  IMPRESSION: No evidence of acute abnormality.  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  Unchanged intrahepatic and CBD dilatation without obstructing cause identified. Consider ERCP/MRCP as indicated.  Moderate to large amount of colonic and rectal stool. No evidence of bowel obstruction.   Electronically Signed   By: Margarette Canada M.D.   On: 12/28/2014 20:07    EKG:  sinus rhythm with nonspecific ST-T wave changes Filed Weights   12/28/14 1634 12/29/14 0322  Weight: 40.778 kg (89 lb 14.4 oz) 43.455 kg (95 lb 12.8 oz)     Physical Exam: Blood pressure 122/62, pulse 70, temperature 97.7 F (36.5 C), temperature source Oral, resp. rate 19, weight 43.455 kg (95 lb 12.8 oz), SpO2 98 %. Body mass index is 16.44 kg/(m^2).   General:  Somewhat disheveled caucasian female with a golf ball sized lesion over her left eye complaining of back and abdominal and leg pain. Head: Normocephalic, sclera non-icteric, no xanthomas, nares are without discharge Large lesion over her left eye with associated arrythmea.  Neck: Soft bilateral carotid bruits. JVD not elevated. Lungs: Clear bilaterally to auscultation without wheezes, rales, or  rhonchi. Breathing is unlabored. Heart: RRR with S1 S2. No murmurs, rubs, or gallops appreciated. Abdomen: Soft, non-tender, non-distended with normoactive bowel sounds. No  hepatomegaly. No rebound/guarding. No obvious abdominal masses.Mid abdominal incision appears intact with a small periumbilical hernia.  Msk:  Strength and tone appear normal for age. Extremities: No clubbing or cyanosis. No edema.  Distal pulses non palpable. Ulceration on left foot. Golf ball sized lesion in her right groin. Neuro: Alert and oriented X 3. No facial asymmetry. No focal deficit. Moves all extremities spontaneously. Psych:  Responds to questions appropriately with a normal affect.    Assessment and Plan: 69 year old female with multiple medical problems including coronary artery disease status post PCI carried out in [facility], history of apparent ischemic cardiomyopathy status post AICD placement followed in [place] by electrophysiology, history of peripheral vascular disease being followed by vascular surgery. She is status post femorofemoral bypass, history of chronic wounds over her left eye, right groin, left foot. Her left foot lesion has grown MRSA. She is on chronic pain medication for chronic back pain. She presented to the emergency room complaining of chest pain. Electrocardiogram was not acutely changed from baseline. She does have mild serum troponin elevation. Functional study 1 year ago it [facility] revealed large fixed area of defect with no reversibility. This was consistent with scar. She recently underwent abdominal surgery for a ventral hernia and lysis of adhesions. This wound for the most part appears to be healing fairly well. She also was recently admitted in the last 3 months for sepsis and respiratory failure requiring transferred to [facility] for intensive treatment. Her ICD is being followed at [facility] and appears to be functioning normally. Her elevated serum troponin is likely  due to multiple factors. Her troponin is declining which does not extend to suggest an acute coronary event. She may have had ischemia several days to week ago with resultant troponin elevation that we are seeing the end of. She is not a candidate for invasive evaluation at present given her persistent skin wounds and a non-acutely ischemic picture including her electrocardiogram and serum troponin. We'll continue to treat with aspirin and clopidogrel as well as beta blocker therapy with cardiomyopathy evidence-based beta blockers including carvedilol. Will continue to treat her skin wounds. Blood cultures appear appropriate however patient is currently on anti-biotics which will likely sterilize the blood cultures. Wound cultures may be appropriate. We'll follow along with you.   Signed, Javier Docker Fath MD [facility]   12/29/2014, 7:20 AM

## 2014-12-30 DIAGNOSIS — G894 Chronic pain syndrome: Secondary | ICD-10-CM | POA: Diagnosis not present

## 2014-12-30 MED ORDER — CHLORHEXIDINE GLUCONATE CLOTH 2 % EX PADS: 6.0000 | MEDICATED_PAD | Freq: Every day | CUTANEOUS | Status: DC

## 2014-12-30 NOTE — Discharge Instructions (Signed)
Use your oxygen as before °

## 2014-12-30 NOTE — Progress Notes (Signed)
Discharge instructions given to patient. Education given: COPD gold booklet, chest pain, MRSA. IV and tele discontinued. Patient has no further questions. Will follow up with PCP in [facility] outpatient.

## 2014-12-30 NOTE — Discharge Summary (Signed)
[facility] [place] NAME: Lynn Butler    MR#:  [id number]  DATE OF BIRTH:  May 27, 1946  DATE OF ADMISSION:  12/28/2014 ADMITTING PHYSICIAN: Loletha Grayer, MD  DATE OF DISCHARGE: 12/30/2014  PRIMARY CARE PHYSICIAN: [facility]    ADMISSION DIAGNOSIS:  COPD exacerbation [J44.1] NSTEMI (non-ST elevated myocardial infarction) [I21.4]  DISCHARGE DIAGNOSIS:  Chest pain-resolved Chronic pain syndrome COPD on home oxygen  SECONDARY DIAGNOSIS:   Past Medical History  Diagnosis Date  . COPD (chronic obstructive pulmonary disease)   . Hypertension   . Ischemic cardiomyopathy     a.   . Coronary artery disease     a. s/p multiple stenting in 2005  . History of ventricular tachycardia   . PVD (peripheral vascular disease)   . PAD (peripheral artery disease)   . HLD (hyperlipidemia)   . Chronic back pain   . MI, old   . MRSA (methicillin resistant staph aureus) culture positive     left foot wound  . CHF (congestive heart failure)     HOSPITAL COURSE:   69 y.o. 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 She presents with a week's worth of chest pain more constant for the last 3 days feels like a pressure in the center of her chest -She was found to have mild elevated troponin. Admitted with...  1. Chest pain, borderline troponin -the patient had a stress test within the year  -appreciate cardiology consultation with Dr Ubaldo Glassing. Pt is doing well. Recommends rx medically -cont plavix -f/u cardiology at [facility]. The patient does have a history of coronary artery disease and severe cardiomyopathy. -Continue Plavix. No aspirin with NSAID allergy.  2. 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 -continue usual  inhalers and nebulizers.  3. Essential hypertension continue amlodipine.  4. Hyperlipidemia unspecified continue atorvastatin.  5. Chronic pain in abdomen, lower back and legs continue her usual home regimen of pain medications. -pt advised to get her meds from [facility]  6. Lower extremity edema - give Lasix on a daily basis  7. Depression and anxiety continue Valium.  8. Severe peripheral vascular disease, pseudoaneurysm right groin. Patient must stop smoking.  9. Tobacco abuse smoking cessation counseling done 3 minutes by me, low-dose nicotine patch prescribed.  10. Basal cell cancers all over her face- pt recommended to f/u Dermatology    DISCHARGE CONDITIONS:   fair  CONSULTS OBTAINED:   Teodoro Spray, MD  DRUG ALLERGIES:   Allergies  Allergen Reactions  . Nsaids Nausea And Vomiting and Other (See Comments)    GI distress, burning    DISCHARGE MEDICATIONS:   Current Discharge Medication List    START taking these medications   Details  Chlorhexidine Gluconate Cloth 2 % PADS Apply 6 each topically daily at 6 (six) AM. Qty: 6 each, Refills: 0      CONTINUE these medications which have NOT CHANGED   Details  albuterol (PROAIR HFA) 108 (90 BASE) MCG/ACT inhaler Inhale 1-2 puffs into the lungs every 4 (four) hours as needed for wheezing or shortness of breath.    amLODipine (NORVASC) 5 MG tablet Take 5 mg by mouth daily.    atorvastatin (LIPITOR) 80 MG tablet Take 80 mg by mouth daily.    carvedilol (COREG)  3.125 MG tablet Take 3.125 mg by mouth 2 (two) times daily.    clopidogrel (PLAVIX) 75 MG tablet Take 75 mg by mouth daily.    collagenase (SANTYL) ointment Apply topically daily. Qty: 15 g, Refills: 0    diazepam (VALIUM) 10 MG tablet Take 1 tablet (10 mg total) by mouth every 4 (four) hours as needed for anxiety. Qty: 30 tablet, Refills: 0    Fluticasone-Salmeterol (ADVAIR DISKUS) 250-50 MCG/DOSE AEPB Inhale 1 puff into the lungs 2 (two) times daily.     furosemide (LASIX) 20 MG tablet Take 20 mg by mouth daily as needed.    HYDROmorphone (DILAUDID) 4 MG tablet Take 1 tablet (4 mg total) by mouth every 6 (six) hours as needed for severe pain. Qty: 40 tablet, Refills: 0    ipratropium-albuterol (DUONEB) 0.5-2.5 (3) MG/3ML SOLN Take 3 mLs by nebulization every 6 (six) hours as needed (wheezing, shortness of breath).    nitroGLYCERIN (NITROSTAT) 0.4 MG SL tablet 0.4 mg.    OxyCODONE (OXYCONTIN) 80 mg T12A 12 hr tablet Take 1 tablet (80 mg total) by mouth every 12 (twelve) hours. Qty: 60 tablet, Refills: 0    promethazine (PHENERGAN) 25 MG tablet Take 25 mg by mouth.    tiotropium (SPIRIVA HANDIHALER) 18 MCG inhalation capsule Place 1 capsule into inhaler and inhale daily.    vitamin C (ASCORBIC ACID) 500 MG tablet Take 500 mg by mouth daily.    zolpidem (AMBIEN) 10 MG tablet Take 0.5 tablets (5 mg total) by mouth at bedtime as needed for sleep. Refills: 0    predniSONE (DELTASONE) 10 MG tablet Take 1 tablet (10 mg total) by mouth daily with breakfast. Label  & dispense according to the schedule below.  6 tablets day one, then 5 table day 2, then 4 tablets day 3, then 3 tablets day 4, 2 tablets day 5, then 1 tablet day 6, then stop Qty: 21 tablet, Refills: 0      STOP taking these medications     levofloxacin (LEVAQUIN) 750 MG tablet          DISCHARGE INSTRUCTIONS:  Keep your f/u appt's Use your oxygen as before  If you experience worsening of your admission symptoms, develop shortness of breath, life threatening emergency, suicidal or homicidal thoughts you must seek medical attention immediately by calling 911 or calling your MD immediately  if symptoms less severe.  You Must read complete instructions/literature along with all the possible adverse reactions/side effects for all the Medicines you take and that have been prescribed to you. Take any new Medicines after you have completely understood and accept all the  possible adverse reactions/side effects.   Please note  You were cared for by a hospitalist during your hospital stay. If you have any questions about your discharge medications or the care you received while you were in the hospital after you are discharged, you can call the unit and asked to speak with the hospitalist on call if the hospitalist that took care of you is not available. Once you are discharged, your primary care physician will handle any further medical issues. Please note that NO REFILLS for any discharge medications will be authorized once you are discharged, as it is imperative that you return to your primary care physician (or establish a relationship with a primary care physician if you do not have one) for your aftercare needs so that they can reassess your need for medications and monitor your lab values. Today   SUBJECTIVE  Chronic pain however feels at baseline  VITAL SIGNS:  Blood pressure 128/70, pulse 71, temperature 98.4 F (36.9 C), temperature source Oral, resp. rate 20, height 5\' 4"  (1.626 m), weight 44.271 kg (97 lb 9.6 oz), SpO2 99 %.  I/O:   Intake/Output Summary (Last 24 hours) at 12/30/14 1031 Last data filed at 12/30/14 0830  Gross per 24 hour  Intake      3 ml  Output    300 ml  Net   -297 ml    PHYSICAL EXAMINATION:  GENERAL: 69 y.o.-year-old patient lying in the bed with no acute distress. thin EYES: Pupils equal, round, reactive to light and accommodation. No scleral icterus. Extraocular muscles intact.  HEENT: Head atraumatic, normocephalic. Oropharynx and nasopharynx clear.  NECK: Supple, no jugular venous distention. No thyroid enlargement, no tenderness.  LUNGS: Decreased breath sounds bilaterally, positive for wheezing throughout entire lung field, no rales,rhonchi or crepitation. No use of accessory muscles of respiration.  CARDIOVASCULAR: S1, S2 normal. No murmurs, rubs, or gallops.  ABDOMEN: Soft, positive for tenderness in  abdomen over hernia site, nondistended. Bowel sounds present. No organomegaly or mass.  EXTREMITIES: Trace edema edema, no cyanosis.  NEUROLOGIC: Cranial nerves II through XII are intact. Muscle strength 5/5 in all extremities. Sensation intact. Gait not checked.  PSYCHIATRIC: The patient is alert and oriented x 3.  SKIN: Small basal cell cancer at the scalp line. Large basal cell cancer left forehand, small basal cell cancer on the right eye on her cheek. Basal cell cancer on her left nose. Ulcer oval-shaped on her left anterior ankle area.  DATA REVIEW:   CBC   Recent Labs Lab 12/29/14 0223  WBC 8.5  HGB 10.8*  HCT 35.2  PLT 217    Chemistries   Recent Labs Lab 12/28/14 1646 12/29/14 0223  NA 142 141  K 3.7 4.1  CL 105 101  CO2 31 30  GLUCOSE 101* 196*  BUN 13 16  CREATININE 0.74 0.70  CALCIUM 8.4* 8.5*  AST 18  --   ALT 17  --   ALKPHOS 84  --   BILITOT 0.4  --     Microbiology Results   Recent Results (from the past 240 hour(s))  MRSA PCR Screening     Status: Abnormal   Collection Time: 12/29/14  5:23 PM  Result Value Ref Range Status   MRSA by PCR POSITIVE (A) NEGATIVE Final    Comment:        The GeneXpert MRSA Assay (FDA approved for NASAL specimens only), is one component of a comprehensive MRSA colonization surveillance program. It is not intended to diagnose MRSA infection nor to guide or monitor treatment for MRSA infections. CRITICAL RESULT CALLED TO, READ BACK BY AND VERIFIED WITH: GIVEN TO JESSICA CHRISTMAS ON 12/29/14 AT 1905 BY JEF     RADIOLOGY:  Dg Chest 1 View  12/28/2014   CLINICAL DATA:  Shortness of breath and chest pressure, has not taken medications for 1 week, history COPD, coronary artery disease post MI, hypertension, ischemic cardiomyopathy  EXAM: CHEST  1 VIEW  COMPARISON:  Portable exam 1722 hours compared to 12/16/2014  FINDINGS: LEFT subclavian AICD lead tip projects over RIGHT ventricle.  Normal heart size, mediastinal  contours and pulmonary vascularity.  Atherosclerotic calcification aorta.  Lungs appear emphysematous with mild atelectasis versus infiltrate in RIGHT lower lobe.  Skin folds project over RIGHT chest.  Remaining lungs clear.  No pleural effusion or pneumothorax.  IMPRESSION: Minimal atelectasis versus infiltrate  in RIGHT lower lobe.  Underlying emphysematous changes.   Electronically Signed   By: Lavonia Dana M.D.   On: 12/28/2014 17:38   Ct Abdomen Pelvis W Contrast  12/28/2014   CLINICAL DATA:  69 year old female with diffuse abdominal and pelvic pain. History of small bowel obstruction surgery 1-1/2 months ago.  EXAM: CT ABDOMEN AND PELVIS WITH CONTRAST  TECHNIQUE: Multidetector CT imaging of the abdomen and pelvis was performed using the standard protocol following bolus administration of intravenous contrast.  CONTRAST:  54mL OMNIPAQUE IOHEXOL 300 MG/ML  SOLN  COMPARISON:  11/15/2014 and prior exams  FINDINGS: Lower chest: Cardiomegaly, pacemaker lead and right lower lobe atelectasis/scarring again noted.  Hepatobiliary: Intrahepatic and CBD dilatation again noted without obstructing cause identified. The CBD measures up to 1.4 cm in greatest diameter.  Pancreas: Unremarkable  Spleen: Unremarkable  Adrenals/Urinary Tract: Bilateral renal cortical atrophy and nonobstructing renal calculi again noted. There is no evidence of hydronephrosis or solid renal mass. The adrenal glands and bladder are unremarkable.  Stomach/Bowel: A moderate to large amount of stool within the descending/sigmoid colon and rectum noted. There is no evidence of bowel obstruction or focal bowel wall thickening.  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 Abdominal aortic atherosclerotic calcifications are noted without aneurysm. No enlarged lymph nodes are identified.  Reproductive: Unremarkable  Other: No free fluid, abscess or pneumoperitoneum.  Anterior abdominal wall postoperative changes are noted without abscess. Mild diffuse subcutaneous edema is present.  Musculoskeletal: No acute or suspicious abnormalities. Degenerative changes within the lower lumbar spine again noted.  IMPRESSION: No evidence of acute abnormality.  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  Unchanged intrahepatic and CBD dilatation without obstructing cause identified. Consider ERCP/MRCP as indicated.  Moderate to large amount of colonic and rectal stool. No evidence of bowel obstruction.   Electronically Signed   By: Margarette Canada M.D.   On: 12/28/2014 20:07     Management plans discussed with the patient, family and they are in agreement.  CODE STATUS:     Code Status Orders        Start     Ordered   12/28/14 2144  Full code   Continuous     12/28/14 2144      TOTAL TIME TAKING CARE OF THIS PATIENT: 40  minutes.    Cylie Dor M.D on 12/30/2014 at 10:31 AM  Between 7am to 6pm - Pager - [phone] After 6pm go to [email or web address] - password [email or web address] [facility]  [facility]  Office  [phone]  CC: Primary care physician; No PCP Per Patient

## 2015-01-09 ENCOUNTER — Encounter: Payer: Self-pay | Admitting: Gynecology

## 2015-01-09 ENCOUNTER — Ambulatory Visit
Admission: EM | Admit: 2015-01-09 | Discharge: 2015-01-09 | Disposition: A | Discharge status: Home / Self Care | Payer: Medicare Other | Attending: Emergency Medicine | Admitting: Emergency Medicine

## 2015-01-09 DIAGNOSIS — L97929 Non-pressure chronic ulcer of unspecified part of left lower leg with unspecified severity: Secondary | ICD-10-CM | POA: Diagnosis not present

## 2015-01-09 DIAGNOSIS — G8929 Other chronic pain: Secondary | ICD-10-CM

## 2015-01-09 MED ORDER — GABAPENTIN 300 MG PO CAPS: ORAL_CAPSULE | ORAL | Status: DC

## 2015-01-09 MED ORDER — TRAMADOL HCL 50 MG PO TABS: 50.0000 mg | ORAL_TABLET | Freq: Four times a day (QID) | ORAL | Status: DC | PRN

## 2015-01-09 NOTE — ED Notes (Signed)
Patient c/o foot ulcer x 1 year. Per pt. Has been seeing a wound specialist at [place]. Pt. Was last seen x 2-3 months  Ago by her wound doctor. Per pt. Was told  MRSA on left x 2 weeks ago when she was in the ED at [facility]. Pt. Stated that x today while getting out of car bumped  Left foot on car door and now hurting.

## 2015-01-09 NOTE — ED Provider Notes (Signed)
HPI  SUBJECTIVE:  Lynn Butler is a 69 y.o. female who presents with left lower extremity pain after hitting her leg on a car earlier today. She has a chronic nonhealing, painful ulcer on this leg, but states that he got acutely worse after bumping her leg. No bleeding, no change in drainage. She denies any other injury. She tried Tylenol for this. She states that pain is better with Percocet or OxyContin which was given to her on her recent hospitalization, worse with walking, palpation. She states that this is not really new for her. Past medical history include CHF, PVD, nasal MRSA, COPD, NSTEMI, chronic back pain, bowel resection, hypertension. She was recently admitted to the hospital for COPD exacerbation, NSTEMI.  Patient initially denied being on any narcotics.  Past Medical History  Diagnosis Date  . COPD (chronic obstructive pulmonary disease)   . Hypertension   . Ischemic cardiomyopathy     a.   . Coronary artery disease     a. s/p multiple stenting in 2005  . History of ventricular tachycardia   . PVD (peripheral vascular disease)   . PAD (peripheral artery disease)   . HLD (hyperlipidemia)   . Chronic back pain   . MI, old   . MRSA (methicillin resistant staph aureus) culture positive     left foot wound  . CHF (congestive heart failure)     Past Surgical History  Procedure Laterality Date  . Insert / replace / remove pacemaker    . Vascular bypass surgery Bilateral     Family History  Problem Relation Age of Onset  . CAD Other   . Breast cancer Mother   . Heart disease Mother   . Coronary artery disease Father     History  Substance Use Topics  . Smoking status: Current Every Day Smoker -- 0.50 packs/day    Types: Cigarettes  . Smokeless tobacco: Not on file  . Alcohol Use: No    No current facility-administered medications for this encounter.  Current outpatient prescriptions:  .  albuterol (PROAIR HFA) 108 (90 BASE) MCG/ACT inhaler, Inhale 1-2  puffs into the lungs every 4 (four) hours as needed for wheezing or shortness of breath., Disp: , Rfl:  .  amLODipine (NORVASC) 5 MG tablet, Take 5 mg by mouth daily., Disp: , Rfl:  .  atorvastatin (LIPITOR) 80 MG tablet, Take 80 mg by mouth daily., Disp: , Rfl:  .  carvedilol (COREG) 3.125 MG tablet, Take 3.125 mg by mouth 2 (two) times daily., Disp: , Rfl:  .  Chlorhexidine Gluconate Cloth 2 % PADS, Apply 6 each topically daily at 6 (six) AM., Disp: 6 each, Rfl: 0 .  clopidogrel (PLAVIX) 75 MG tablet, Take 75 mg by mouth daily., Disp: , Rfl:  .  collagenase (SANTYL) ointment, Apply topically daily., Disp: 15 g, Rfl: 0 .  diazepam (VALIUM) 10 MG tablet, Take 1 tablet (10 mg total) by mouth every 4 (four) hours as needed for anxiety., Disp: 30 tablet, Rfl: 0 .  Fluticasone-Salmeterol (ADVAIR DISKUS) 250-50 MCG/DOSE AEPB, Inhale 1 puff into the lungs 2 (two) times daily., Disp: , Rfl:  .  furosemide (LASIX) 20 MG tablet, Take 20 mg by mouth daily as needed., Disp: , Rfl:  .  HYDROmorphone (DILAUDID) 4 MG tablet, Take 1 tablet (4 mg total) by mouth every 6 (six) hours as needed for severe pain., Disp: 40 tablet, Rfl: 0 .  ipratropium-albuterol (DUONEB) 0.5-2.5 (3) MG/3ML SOLN, Take 3 mLs by nebulization every  6 (six) hours as needed (wheezing, shortness of breath)., Disp: , Rfl:  .  nitroGLYCERIN (NITROSTAT) 0.4 MG SL tablet, 0.4 mg., Disp: , Rfl:  .  OxyCODONE (OXYCONTIN) 80 mg T12A 12 hr tablet, Take 1 tablet (80 mg total) by mouth every 12 (twelve) hours., Disp: 60 tablet, Rfl: 0 .  predniSONE (DELTASONE) 10 MG tablet, Take 1 tablet (10 mg total) by mouth daily with breakfast. Label  & dispense according to the schedule below.  6 tablets day one, then 5 table day 2, then 4 tablets day 3, then 3 tablets day 4, 2 tablets day 5, then 1 tablet day 6, then stop, Disp: 21 tablet, Rfl: 0 .  promethazine (PHENERGAN) 25 MG tablet, Take 25 mg by mouth., Disp: , Rfl:  .  tiotropium (SPIRIVA HANDIHALER) 18 MCG  inhalation capsule, Place 1 capsule into inhaler and inhale daily., Disp: , Rfl:  .  vitamin C (ASCORBIC ACID) 500 MG tablet, Take 500 mg by mouth daily., Disp: , Rfl:  .  zolpidem (AMBIEN) 10 MG tablet, Take 0.5 tablets (5 mg total) by mouth at bedtime as needed for sleep., Disp: , Rfl: 0 .  gabapentin (NEURONTIN) 300 MG capsule, 1 tab po at bedtime 1st day, 1 tablet bid second day, then 1 tablet tid, Disp: 20 capsule, Rfl: 0 .  traMADol (ULTRAM) 50 MG tablet, Take 1 tablet (50 mg total) by mouth every 6 (six) hours as needed., Disp: 15 tablet, Rfl: 0  Allergies  Allergen Reactions  . Nsaids Nausea And Vomiting and Other (See Comments)    GI distress, burning     ROS  As noted in HPI.   Physical Exam  BP 125/73 mmHg  Pulse 102  Temp(Src) 98.3 F (36.8 C) (Tympanic)  Ht 5\' 4"  (1.626 m)  Wt 90 lb (40.824 kg)  BMI 15.44 kg/m2  SpO2 96%  Constitutional: Well developed, well nourished, no acute distress Eyes:  EOMI, conjunctiva normal bilaterally HENT: Normocephalic, atraumatic,mucus membranes moist Respiratory: Normal inspiratory effort Cardiovascular: Normal rate GI: nondistended skin:  1 x 0.5 cm chronic ulcer in her left lower extremity, no bleeding. + diffuse tenderness in area. + rubor c/w PVD.  Palpable pulse. Musculoskeletal: no deformities Neurologic: Alert & oriented x 3, no focal neuro deficits Psychiatric: Speech and behavior appropriate   ED Course   Medications - No data to display  No orders of the defined types were placed in this encounter.    No results found for this or any previous visit (from the past 24 hour(s)). No results found.  ED Clinical Impression  Leg ulcer, left, with unspecified severity  Chronic pain   ED Assessment/Plan  No evidence of acute infection or fracture. Deferred imaging. narcotic database reviewed. patient was prescribed oxycontin no. 60, 30 day supply worth, dilaudid no. 40, 10 day supply by dr. Posey Pronto on 5/22.  patient was not initially forthcoming about her narcotic medications. when further asked, she states that she lost the oxycontin during a move. discussed with her that we were unable to fill lost or stolen prescriptions.   discussed with her that we could do a few days of tramadol, try some neurontin, that all chronic pain medications must come from her primary care physician or from her pain/wound care specialist. Discussed with her that she would not be able to get any more refills of narcotics for chronic pain issues from this facility. Patient agrees with plan  *This clinic note was created using Dragon dictation software. Therefore,  there may be occasional mistakes despite careful proofreading.  ?    Melynda Ripple, MD 01/09/15 1422

## 2015-01-09 NOTE — Discharge Instructions (Signed)
You will need to follow-up with her primary care physician or your wound care physician for further narcotic refills. We will be unable to provide any more refills or narcotics here at this facility. Try the tramadol, try the Neurontin. Make sure you follow up with your doctor within a week to make sure that it is working appropriately for you.   Emergency care providers appreciate that many patients coming to [place] are in severe pain and we wish to address their pain in the safest, most responsible manner.  It is important to recognize however, that the proper treatment of chronic pain differs from that of the pain of injuries and acute illnesses.  Our goal is to provide quality, safe, personalized care and we thank you for giving [place] the opportunity to serve you.  The use of narcotics and related agents for chronic pain syndromes may lead to additional physical and psychological problems.  Nearly as many people die from prescription narcotics each year as die from car crashes.  Additionally, this risk is increased if such prescriptions are obtained from a variety of sources.  Therefore, only your primary care physician or a pain management specialist is able to safely treat such syndromes with narcotic medications long-term.    Documentation revealing such prescriptions have been sought from multiple sources may prohibit [place] from providing a refill or different narcotic medication.  Your name may be checked first through the [place].  This database is a record of controlled substance medication prescriptions that the patient has received.  This has been established by [facility] in an effort to eliminate the dangerous, and often life threatening, practice of obtaining multiple prescriptions from different medical providers.   If you have a chronic pain syndrome (i.e. chronic headaches, recurrent back or neck pain, dental pain, abdominal or pelvis pain without a  specific diagnosis, or neuropathic pain such as fibromyalgia) or recurrent visits for the same condition without an acute diagnosis, you may be treated with non-narcotics and other non-addictive medicines.  Allergic reactions or negative side effects that may be reported by a patient to such medications will not typically lead to the use of a narcotic analgesic or other controlled substance as an alternative.  Patients managing chronic pain with a personal physician should have provisions in place for breakthrough pain.  If you are in crisis, you should call your physician.  If your physician directs you to the emergency department, please have the doctor call and speak to our attending physician concerning your care.  When patients come to the Emergency Department (ED) with acute medical conditions in which the Emergency Department physician feels appropriate to prescribe narcotic or sedating pain medication, the physician will prescribe these in very limited quantities.  The amount of these medications will last only until you can see your primary care physician in his/her office.  Any patient who returns to the ED seeking refills should expect only non-narcotic pain medications.   In the event of an acute medical condition exists and the emergency physician feels it is necessary that the patient be given a narcotic or sedating medication -  a responsible adult driver should be present in the room prior to the medication being given by the nurse.  Prescriptions for narcotic or sedating medications that have been lost, stolen or expired will not be refilled in the Emergency Department.    Patients who have chronic pain may receive non-narcotic prescriptions until seen by their primary care  physician.  It is every patients personal responsibility to maintain active prescriptions with his or her primary care physician or specialist.

## 2015-01-13 ENCOUNTER — Encounter: Payer: Self-pay | Admitting: Emergency Medicine

## 2015-01-13 ENCOUNTER — Inpatient Hospital Stay
Admission: EM | Admit: 2015-01-13 | Discharge: 2015-01-17 | DRG: 280 | Disposition: A | Discharge status: Home / Self Care | Payer: Medicare Other | Attending: Internal Medicine | Admitting: Internal Medicine

## 2015-01-13 ENCOUNTER — Emergency Department: Payer: Medicare Other

## 2015-01-13 DIAGNOSIS — M549 Dorsalgia, unspecified: Secondary | ICD-10-CM | POA: Diagnosis present

## 2015-01-13 DIAGNOSIS — K439 Ventral hernia without obstruction or gangrene: Secondary | ICD-10-CM | POA: Diagnosis present

## 2015-01-13 DIAGNOSIS — Z7951 Long term (current) use of inhaled steroids: Secondary | ICD-10-CM | POA: Diagnosis not present

## 2015-01-13 DIAGNOSIS — R109 Unspecified abdominal pain: Secondary | ICD-10-CM | POA: Diagnosis present

## 2015-01-13 DIAGNOSIS — F419 Anxiety disorder, unspecified: Secondary | ICD-10-CM | POA: Diagnosis present

## 2015-01-13 DIAGNOSIS — E876 Hypokalemia: Secondary | ICD-10-CM | POA: Diagnosis present

## 2015-01-13 DIAGNOSIS — Z8679 Personal history of other diseases of the circulatory system: Secondary | ICD-10-CM

## 2015-01-13 DIAGNOSIS — J9801 Acute bronchospasm: Secondary | ICD-10-CM | POA: Diagnosis present

## 2015-01-13 DIAGNOSIS — Z9981 Dependence on supplemental oxygen: Secondary | ICD-10-CM

## 2015-01-13 DIAGNOSIS — Z7902 Long term (current) use of antithrombotics/antiplatelets: Secondary | ICD-10-CM

## 2015-01-13 DIAGNOSIS — I739 Peripheral vascular disease, unspecified: Secondary | ICD-10-CM | POA: Diagnosis present

## 2015-01-13 DIAGNOSIS — R197 Diarrhea, unspecified: Secondary | ICD-10-CM | POA: Diagnosis present

## 2015-01-13 DIAGNOSIS — Z955 Presence of coronary angioplasty implant and graft: Secondary | ICD-10-CM | POA: Diagnosis not present

## 2015-01-13 DIAGNOSIS — Z79899 Other long term (current) drug therapy: Secondary | ICD-10-CM

## 2015-01-13 DIAGNOSIS — I2510 Atherosclerotic heart disease of native coronary artery without angina pectoris: Secondary | ICD-10-CM | POA: Diagnosis present

## 2015-01-13 DIAGNOSIS — I255 Ischemic cardiomyopathy: Secondary | ICD-10-CM | POA: Diagnosis present

## 2015-01-13 DIAGNOSIS — G894 Chronic pain syndrome: Secondary | ICD-10-CM | POA: Diagnosis present

## 2015-01-13 DIAGNOSIS — I214 Non-ST elevation (NSTEMI) myocardial infarction: Principal | ICD-10-CM | POA: Diagnosis present

## 2015-01-13 DIAGNOSIS — Z95810 Presence of automatic (implantable) cardiac defibrillator: Secondary | ICD-10-CM

## 2015-01-13 DIAGNOSIS — D649 Anemia, unspecified: Secondary | ICD-10-CM | POA: Diagnosis present

## 2015-01-13 DIAGNOSIS — Z79891 Long term (current) use of opiate analgesic: Secondary | ICD-10-CM

## 2015-01-13 DIAGNOSIS — E875 Hyperkalemia: Secondary | ICD-10-CM | POA: Diagnosis present

## 2015-01-13 DIAGNOSIS — Z7952 Long term (current) use of systemic steroids: Secondary | ICD-10-CM

## 2015-01-13 DIAGNOSIS — J9621 Acute and chronic respiratory failure with hypoxia: Secondary | ICD-10-CM | POA: Diagnosis present

## 2015-01-13 DIAGNOSIS — I10 Essential (primary) hypertension: Secondary | ICD-10-CM | POA: Diagnosis present

## 2015-01-13 DIAGNOSIS — E43 Unspecified severe protein-calorie malnutrition: Secondary | ICD-10-CM | POA: Diagnosis present

## 2015-01-13 DIAGNOSIS — Z886 Allergy status to analgesic agent status: Secondary | ICD-10-CM | POA: Diagnosis not present

## 2015-01-13 DIAGNOSIS — Z8249 Family history of ischemic heart disease and other diseases of the circulatory system: Secondary | ICD-10-CM | POA: Diagnosis not present

## 2015-01-13 DIAGNOSIS — I219 Acute myocardial infarction, unspecified: Secondary | ICD-10-CM | POA: Diagnosis present

## 2015-01-13 DIAGNOSIS — R112 Nausea with vomiting, unspecified: Secondary | ICD-10-CM | POA: Diagnosis present

## 2015-01-13 DIAGNOSIS — J441 Chronic obstructive pulmonary disease with (acute) exacerbation: Secondary | ICD-10-CM | POA: Diagnosis present

## 2015-01-13 DIAGNOSIS — E785 Hyperlipidemia, unspecified: Secondary | ICD-10-CM | POA: Diagnosis present

## 2015-01-13 DIAGNOSIS — I252 Old myocardial infarction: Secondary | ICD-10-CM

## 2015-01-13 DIAGNOSIS — L97929 Non-pressure chronic ulcer of unspecified part of left lower leg with unspecified severity: Secondary | ICD-10-CM | POA: Diagnosis present

## 2015-01-13 DIAGNOSIS — I509 Heart failure, unspecified: Secondary | ICD-10-CM

## 2015-01-13 DIAGNOSIS — R0603 Acute respiratory distress: Secondary | ICD-10-CM

## 2015-01-13 DIAGNOSIS — R0602 Shortness of breath: Secondary | ICD-10-CM

## 2015-01-13 DIAGNOSIS — F17210 Nicotine dependence, cigarettes, uncomplicated: Secondary | ICD-10-CM | POA: Diagnosis present

## 2015-01-13 DIAGNOSIS — J449 Chronic obstructive pulmonary disease, unspecified: Secondary | ICD-10-CM | POA: Diagnosis present

## 2015-01-13 LAB — CBC WITH DIFFERENTIAL/PLATELET
BASOS ABS: 0.1 10*3/uL (ref 0–0.1)
EOS ABS: 0 10*3/uL (ref 0–0.7)
HCT: 41.4 % (ref 35.0–47.0)
Hemoglobin: 12.7 g/dL (ref 12.0–16.0)
Lymphocytes Relative: 21 %
Lymphs Abs: 2.6 10*3/uL (ref 1.0–3.6)
MCH: 22.2 pg — ABNORMAL LOW (ref 26.0–34.0)
MCHC: 30.8 g/dL — ABNORMAL LOW (ref 32.0–36.0)
MCV: 72.2 fL — AB (ref 80.0–100.0)
MONO ABS: 1.2 10*3/uL — AB (ref 0.2–0.9)
NEUTROS ABS: 9 10*3/uL — AB (ref 1.4–6.5)
Platelets: 316 10*3/uL (ref 150–440)
RBC: 5.73 MIL/uL — ABNORMAL HIGH (ref 3.80–5.20)
RDW: 19.6 % — ABNORMAL HIGH (ref 11.5–14.5)
WBC: 12.9 10*3/uL — ABNORMAL HIGH (ref 3.6–11.0)

## 2015-01-13 LAB — COMPREHENSIVE METABOLIC PANEL
ALBUMIN: 3.1 g/dL — AB (ref 3.5–5.0)
ALK PHOS: 92 U/L (ref 38–126)
ALT: 21 U/L (ref 14–54)
ANION GAP: 9 (ref 5–15)
AST: 30 U/L (ref 15–41)
BUN: 7 mg/dL (ref 6–20)
CO2: 29 mmol/L (ref 22–32)
Calcium: 8.1 mg/dL — ABNORMAL LOW (ref 8.9–10.3)
Chloride: 97 mmol/L — ABNORMAL LOW (ref 101–111)
Creatinine, Ser: 0.73 mg/dL (ref 0.44–1.00)
GFR calc Af Amer: >60 mL/min (ref >60)
GFR calc non Af Amer: >60 mL/min (ref >60)
Glucose, Bld: 109 mg/dL — ABNORMAL HIGH (ref 65–99)
POTASSIUM: 2.6 mmol/L — AB (ref 3.5–5.1)
Sodium: 135 mmol/L (ref 135–145)
TOTAL PROTEIN: 6.8 g/dL (ref 6.5–8.1)
Total Bilirubin: 0.5 mg/dL (ref 0.3–1.2)

## 2015-01-13 LAB — TROPONIN I
TROPONIN I: 21.38 ng/mL — AB (ref <0.031)
Troponin I: 24.49 ng/mL — ABNORMAL HIGH (ref <0.031)

## 2015-01-13 LAB — LIPASE, BLOOD: LIPASE: 49 U/L (ref 22–51)

## 2015-01-13 LAB — MAGNESIUM: Magnesium: 1.7 mg/dL (ref 1.7–2.4)

## 2015-01-13 LAB — LACTIC ACID, PLASMA
Lactic Acid, Venous: 1.4 mmol/L (ref 0.5–2.0)
Lactic Acid, Venous: 0.8 mmol/L (ref 0.5–2.0)

## 2015-01-13 MED ORDER — SODIUM CHLORIDE 0.9 % IV BOLUS (SEPSIS)
500.0000 mL | Freq: Once | INTRAVENOUS | Status: AC
  Administered 2015-01-13: 500 mL via INTRAVENOUS

## 2015-01-13 MED ORDER — NITROGLYCERIN 0.4 MG SL SUBL: 0.4000 mg | SUBLINGUAL_TABLET | SUBLINGUAL | Status: DC | PRN

## 2015-01-13 MED ORDER — METHYLPREDNISOLONE SODIUM SUCC 125 MG IJ SOLR
60.0000 mg | Freq: Four times a day (QID) | INTRAMUSCULAR | Status: DC
  Administered 2015-01-13 – 2015-01-16 (×10): 60 mg via INTRAVENOUS
  Filled 2015-01-13 (×10): qty 2

## 2015-01-13 MED ORDER — ONDANSETRON HCL 4 MG/2ML IJ SOLN
INTRAMUSCULAR | Status: AC
  Administered 2015-01-13: 4 mg via INTRAVENOUS
  Filled 2015-01-13: qty 2

## 2015-01-13 MED ORDER — POTASSIUM CHLORIDE CRYS ER 20 MEQ PO TBCR
40.0000 meq | EXTENDED_RELEASE_TABLET | Freq: Once | ORAL | Status: DC
  Filled 2015-01-13: qty 2

## 2015-01-13 MED ORDER — SODIUM CHLORIDE 0.9 % IJ SOLN
3.0000 mL | Freq: Two times a day (BID) | INTRAMUSCULAR | Status: DC
  Administered 2015-01-13 – 2015-01-17 (×8): 3 mL via INTRAVENOUS

## 2015-01-13 MED ORDER — HYDROMORPHONE HCL 1 MG/ML IJ SOLN
1.0000 mg | INTRAMUSCULAR | Status: DC | PRN
  Administered 2015-01-13 – 2015-01-14 (×2): 1 mg via INTRAVENOUS
  Filled 2015-01-13 (×2): qty 1

## 2015-01-13 MED ORDER — FLUTICASONE FUROATE-VILANTEROL 100-25 MCG/INH IN AEPB: 1.0000 | INHALATION_SPRAY | Freq: Every day | RESPIRATORY_TRACT | Status: DC

## 2015-01-13 MED ORDER — HEPARIN BOLUS VIA INFUSION
2400.0000 [IU] | Freq: Once | INTRAVENOUS | Status: AC
  Administered 2015-01-13: 2400 [IU] via INTRAVENOUS
  Filled 2015-01-13: qty 2400

## 2015-01-13 MED ORDER — AMLODIPINE BESYLATE 5 MG PO TABS
5.0000 mg | ORAL_TABLET | Freq: Every day | ORAL | Status: DC
  Administered 2015-01-14 – 2015-01-17 (×4): 5 mg via ORAL
  Filled 2015-01-13 (×4): qty 1

## 2015-01-13 MED ORDER — POTASSIUM CHLORIDE CRYS ER 20 MEQ PO TBCR
40.0000 meq | EXTENDED_RELEASE_TABLET | Freq: Two times a day (BID) | ORAL | Status: DC
  Administered 2015-01-13: 40 meq via ORAL

## 2015-01-13 MED ORDER — OXYCODONE HCL ER 40 MG PO T12A
80.0000 mg | EXTENDED_RELEASE_TABLET | Freq: Two times a day (BID) | ORAL | Status: DC
  Administered 2015-01-13 – 2015-01-17 (×8): 80 mg via ORAL
  Filled 2015-01-13 (×8): qty 2

## 2015-01-13 MED ORDER — POTASSIUM CHLORIDE 10 MEQ/100ML IV SOLN
10.0000 meq | Freq: Once | INTRAVENOUS | Status: AC
  Administered 2015-01-13: 10 meq via INTRAVENOUS
  Filled 2015-01-13: qty 100

## 2015-01-13 MED ORDER — ASPIRIN 81 MG PO CHEW
CHEWABLE_TABLET | ORAL | Status: AC
  Administered 2015-01-13: 324 mg via ORAL
  Filled 2015-01-13: qty 4

## 2015-01-13 MED ORDER — MORPHINE SULFATE 4 MG/ML IJ SOLN
4.0000 mg | Freq: Once | INTRAMUSCULAR | Status: AC
  Administered 2015-01-13: 4 mg via INTRAVENOUS

## 2015-01-13 MED ORDER — ZOLPIDEM TARTRATE 5 MG PO TABS
5.0000 mg | ORAL_TABLET | Freq: Every evening | ORAL | Status: DC | PRN
  Administered 2015-01-13 – 2015-01-16 (×3): 5 mg via ORAL
  Filled 2015-01-13 (×3): qty 1

## 2015-01-13 MED ORDER — HYDROMORPHONE HCL 2 MG PO TABS
4.0000 mg | ORAL_TABLET | Freq: Four times a day (QID) | ORAL | Status: DC | PRN
  Administered 2015-01-15 – 2015-01-16 (×5): 4 mg via ORAL
  Filled 2015-01-13 (×5): qty 2

## 2015-01-13 MED ORDER — ASPIRIN 81 MG PO CHEW
324.0000 mg | CHEWABLE_TABLET | Freq: Once | ORAL | Status: AC
  Administered 2015-01-13: 324 mg via ORAL

## 2015-01-13 MED ORDER — CETYLPYRIDINIUM CHLORIDE 0.05 % MT LIQD
7.0000 mL | Freq: Two times a day (BID) | OROMUCOSAL | Status: DC
  Administered 2015-01-14 – 2015-01-16 (×5): 7 mL via OROMUCOSAL

## 2015-01-13 MED ORDER — HEPARIN (PORCINE) IN NACL 100-0.45 UNIT/ML-% IJ SOLN
650.0000 [IU]/h | INTRAMUSCULAR | Status: DC
  Administered 2015-01-13: 500 [IU]/h via INTRAVENOUS
  Filled 2015-01-13 (×2): qty 250

## 2015-01-13 MED ORDER — ONDANSETRON HCL 4 MG/2ML IJ SOLN
4.0000 mg | Freq: Once | INTRAMUSCULAR | Status: AC
  Administered 2015-01-13: 4 mg via INTRAVENOUS

## 2015-01-13 MED ORDER — TRAMADOL HCL 50 MG PO TABS
50.0000 mg | ORAL_TABLET | Freq: Four times a day (QID) | ORAL | Status: DC | PRN
  Filled 2015-01-13: qty 1

## 2015-01-13 MED ORDER — POTASSIUM CHLORIDE IN NACL 20-0.9 MEQ/L-% IV SOLN
INTRAVENOUS | Status: DC
  Filled 2015-01-13 (×4): qty 1000

## 2015-01-13 MED ORDER — FUROSEMIDE 20 MG PO TABS: 20.0000 mg | ORAL_TABLET | Freq: Every day | ORAL | Status: DC | PRN

## 2015-01-13 MED ORDER — POTASSIUM CHLORIDE 20 MEQ PO PACK
40.0000 meq | PACK | Freq: Once | ORAL | Status: AC
  Administered 2015-01-14: 40 meq via ORAL
  Filled 2015-01-13: qty 2

## 2015-01-13 MED ORDER — LORAZEPAM 1 MG PO TABS
1.0000 mg | ORAL_TABLET | Freq: Three times a day (TID) | ORAL | Status: DC | PRN
  Administered 2015-01-15: 1 mg via ORAL
  Filled 2015-01-13: qty 1

## 2015-01-13 MED ORDER — ACETAMINOPHEN 650 MG RE SUPP: 650.0000 mg | Freq: Four times a day (QID) | RECTAL | Status: DC | PRN

## 2015-01-13 MED ORDER — CHLORHEXIDINE GLUCONATE 0.12 % MT SOLN
15.0000 mL | Freq: Two times a day (BID) | OROMUCOSAL | Status: DC
  Administered 2015-01-13 – 2015-01-16 (×6): 15 mL via OROMUCOSAL

## 2015-01-13 MED ORDER — MORPHINE SULFATE 4 MG/ML IJ SOLN
INTRAMUSCULAR | Status: AC
  Administered 2015-01-13: 4 mg via INTRAVENOUS
  Filled 2015-01-13: qty 1

## 2015-01-13 MED ORDER — ALBUTEROL SULFATE (2.5 MG/3ML) 0.083% IN NEBU: 2.5000 mg | INHALATION_SOLUTION | Freq: Four times a day (QID) | RESPIRATORY_TRACT | Status: DC | PRN

## 2015-01-13 MED ORDER — ACETAMINOPHEN 325 MG PO TABS: 650.0000 mg | ORAL_TABLET | Freq: Four times a day (QID) | ORAL | Status: DC | PRN

## 2015-01-13 MED ORDER — DIAZEPAM 5 MG PO TABS
10.0000 mg | ORAL_TABLET | ORAL | Status: DC | PRN
  Administered 2015-01-14 – 2015-01-16 (×4): 10 mg via ORAL
  Filled 2015-01-13 (×4): qty 2

## 2015-01-13 MED ORDER — MOMETASONE FURO-FORMOTEROL FUM 100-5 MCG/ACT IN AERO
2.0000 | INHALATION_SPRAY | Freq: Two times a day (BID) | RESPIRATORY_TRACT | Status: DC
  Administered 2015-01-14 – 2015-01-17 (×7): 2 via RESPIRATORY_TRACT
  Filled 2015-01-13: qty 8.8

## 2015-01-13 MED ORDER — POTASSIUM CHLORIDE CRYS ER 20 MEQ PO TBCR
EXTENDED_RELEASE_TABLET | ORAL | Status: AC
  Administered 2015-01-13: 40 meq
  Filled 2015-01-13: qty 2

## 2015-01-13 MED ORDER — ALBUTEROL SULFATE (2.5 MG/3ML) 0.083% IN NEBU
3.0000 mL | INHALATION_SOLUTION | RESPIRATORY_TRACT | Status: DC | PRN
  Filled 2015-01-13: qty 3

## 2015-01-13 MED ORDER — MAGNESIUM SULFATE 2 GM/50ML IV SOLN
2.0000 g | Freq: Once | INTRAVENOUS | Status: AC
  Administered 2015-01-13: 2 g via INTRAVENOUS

## 2015-01-13 MED ORDER — ATORVASTATIN CALCIUM 20 MG PO TABS
40.0000 mg | ORAL_TABLET | Freq: Every day | ORAL | Status: DC
  Administered 2015-01-14 – 2015-01-16 (×3): 40 mg via ORAL
  Filled 2015-01-13 (×3): qty 2

## 2015-01-13 MED ORDER — CLOPIDOGREL BISULFATE 75 MG PO TABS
75.0000 mg | ORAL_TABLET | Freq: Every day | ORAL | Status: DC
  Administered 2015-01-14 – 2015-01-17 (×4): 75 mg via ORAL
  Filled 2015-01-13 (×4): qty 1

## 2015-01-13 MED ORDER — MAGNESIUM SULFATE 2 GM/50ML IV SOLN
INTRAVENOUS | Status: AC
  Filled 2015-01-13: qty 50

## 2015-01-13 NOTE — Progress Notes (Addendum)
ANTICOAGULATION CONSULT NOTE - Initial Consult  Pharmacy Consult for Heparin Indication: chest pain/ACS  Allergies  Allergen Reactions  . Nsaids Nausea And Vomiting and Other (See Comments)    Reaction:  GI distress and burning     Patient Measurements: Height: 5' 4.5" (163.8 cm) Weight: 89 lb (40.37 kg) IBW/kg (Calculated) : 55.85 Heparin Dosing Weight: 40.4 kg  Vital Signs: Temp: 99.1 F (37.3 C) (06/16 1735) Temp Source: Oral (06/16 1735) BP: 149/87 mmHg (06/16 1946) Pulse Rate: 104 (06/16 1946)  Labs:  Recent Labs  01/13/15 1830  HGB 12.7  HCT 41.4  PLT 316  CREATININE 0.73  TROPONINI 21.38*    Estimated Creatinine Clearance: 42.9 mL/min (by C-G formula based on Cr of 0.73).   Medical History: Past Medical History  Diagnosis Date  . COPD (chronic obstructive pulmonary disease)   . Hypertension   . Ischemic cardiomyopathy     a.   . Coronary artery disease     a. s/p multiple stenting in 2005  . History of ventricular tachycardia   . PVD (peripheral vascular disease)   . PAD (peripheral artery disease)   . HLD (hyperlipidemia)   . Chronic back pain   . MI, old   . MRSA (methicillin resistant staph aureus) culture positive     left foot wound  . CHF (congestive heart failure)     Medications:   (Not in a hospital admission)  Assessment: ACS/NSTEMI  Goal of Therapy:  Heparin level 0.3-0.7 units/ml Monitor platelets by anticoagulation protocol: Yes   Plan:  Give 2400 units bolus x 1 Start heparin infusion at 500 units/hr Check anti-Xa level in 6 hours and daily while on heparin Continue to monitor H&H and platelets.  Will draw 1st HL on 6/17 @ 2:30.   Robbins,Jason D 01/13/2015,7:47 PM     6/17 02:00 anti-Xa <0.1. 1200 unit bolus and increase rate to 650 units/hr. Recheck in 6 hours.  Sim Boast, PharmD, BCPS  01/14/2015

## 2015-01-13 NOTE — ED Provider Notes (Signed)
[facility] Emergency Department Provider Note   ____________________________________________  Time seen: On arrival I have reviewed the triage vital signs and the triage nursing note.  HISTORY  Chief Complaint Chest Pain   Historian Patient  HPI Lynn Butler is a 69 y.o. female who is complaining of progressive dyspnea, cough, and chest pain, and generalized fatigue over 1-2 weeks. She is concerned that his "heart problems". When asked what this is she says it had congestive heart failure. She does have a heart attack in the past. Unsure if she's had a fever. For the past 2 days she says she's been laying on the couch feeling fatigued and having chest discomfort and shortness of breath.    Past Medical History  Diagnosis Date  . COPD (chronic obstructive pulmonary disease)   . Hypertension   . Ischemic cardiomyopathy     a.   . Coronary artery disease     a. s/p multiple stenting in 2005  . History of ventricular tachycardia   . PVD (peripheral vascular disease)   . PAD (peripheral artery disease)   . HLD (hyperlipidemia)   . Chronic back pain   . MI, old   . MRSA (methicillin resistant staph aureus) culture positive     left foot wound  . CHF (congestive heart failure)     Patient Active Problem List   Diagnosis Date Noted  . Chest pain 12/28/2014  . COPD exacerbation 12/16/2014  . Protein-calorie malnutrition, severe 07/22/2014  . Acute on chronic respiratory failure with hypoxemia 07/21/2014  . COPD mixed type 07/21/2014  . CAD (coronary artery disease) 07/21/2014  . CHF (congestive heart failure) 07/21/2014  . PVD (peripheral vascular disease) 07/21/2014  . Leg ulcer 07/21/2014  . Skin lesion of face 07/21/2014  . Adult failure to thrive 07/21/2014  . Claudication of both lower extremities 07/21/2014  . Smoker 07/21/2014    Past Surgical History  Procedure Laterality Date  . Insert / replace / remove pacemaker    . Vascular  bypass surgery Bilateral     Current Outpatient Rx  Name  Route  Sig  Dispense  Refill  . albuterol (PROAIR HFA) 108 (90 BASE) MCG/ACT inhaler   Inhalation   Inhale 1-2 puffs into the lungs every 4 (four) hours as needed for wheezing or shortness of breath.         Marland Kitchen albuterol (PROVENTIL) (2.5 MG/3ML) 0.083% nebulizer solution   Nebulization   Take 2.5 mg by nebulization every 6 (six) hours as needed for wheezing or shortness of breath.         Marland Kitchen amLODipine (NORVASC) 5 MG tablet   Oral   Take 5 mg by mouth daily.         . clopidogrel (PLAVIX) 75 MG tablet   Oral   Take 75 mg by mouth daily.         . diazepam (VALIUM) 10 MG tablet   Oral   Take 1 tablet (10 mg total) by mouth every 4 (four) hours as needed for anxiety.   30 tablet   0   . Fluticasone Furoate-Vilanterol (BREO ELLIPTA) 100-25 MCG/INH AEPB   Inhalation   Inhale 1 puff into the lungs daily.         . furosemide (LASIX) 20 MG tablet   Oral   Take 20 mg by mouth daily as needed for edema.         Marland Kitchen HYDROmorphone (DILAUDID) 4 MG tablet   Oral  Take 1 tablet (4 mg total) by mouth every 6 (six) hours as needed for severe pain.   40 tablet   0   . LORazepam (ATIVAN) 1 MG tablet   Oral   Take 1 mg by mouth every 8 (eight) hours as needed for anxiety.         . nitroGLYCERIN (NITROSTAT) 0.4 MG SL tablet   Sublingual   Place 0.4 mg under the tongue every 5 (five) minutes as needed for chest pain.         . OxyCODONE (OXYCONTIN) 80 mg T12A 12 hr tablet   Oral   Take 1 tablet (80 mg total) by mouth every 12 (twelve) hours.   60 tablet   0   . traMADol (ULTRAM) 50 MG tablet   Oral   Take 1 tablet (50 mg total) by mouth every 6 (six) hours as needed. Patient taking differently: Take 50 mg by mouth every 6 (six) hours as needed for moderate pain.    15 tablet   0   . zolpidem (AMBIEN) 10 MG tablet   Oral   Take 0.5 tablets (5 mg total) by mouth at bedtime as needed for sleep. Patient  taking differently: Take 10 mg by mouth at bedtime as needed for sleep.       0   . Chlorhexidine Gluconate Cloth 2 % PADS   Topical   Apply 6 each topically daily at 6 (six) AM. Patient not taking: Reported on 01/13/2015   6 each   0   . collagenase (SANTYL) ointment   Topical   Apply topically daily. Patient not taking: Reported on 01/13/2015   15 g   0   . gabapentin (NEURONTIN) 300 MG capsule      1 tab po at bedtime 1st day, 1 tablet bid second day, then 1 tablet tid Patient not taking: Reported on 01/13/2015   20 capsule   0   . predniSONE (DELTASONE) 10 MG tablet   Oral   Take 1 tablet (10 mg total) by mouth daily with breakfast. Label  & dispense according to the schedule below.  6 tablets day one, then 5 table day 2, then 4 tablets day 3, then 3 tablets day 4, 2 tablets day 5, then 1 tablet day 6, then stop Patient not taking: Reported on 01/13/2015   21 tablet   0     Label  & dispense according to the schedule below. ...     Allergies Nsaids  Family History  Problem Relation Age of Onset  . CAD Other   . Breast cancer Mother   . Heart disease Mother   . Coronary artery disease Father     Social History History  Substance Use Topics  . Smoking status: Current Every Day Smoker -- 0.50 packs/day    Types: Cigarettes  . Smokeless tobacco: Not on file  . Alcohol Use: No    Review of Systems  Constitutional: Negative for fever. Eyes: Negative for visual changes. ENT: Negative for sore throat. Cardiovascular: Positive for chest pressure for about 2 days.  Respiratory: Positive for shortness of breath, slightly worse than chronic Gastrointestinal: Negative for abdominal pain, vomiting and diarrhea. Genitourinary: Negative for dysuria. Musculoskeletal: Negative for back pain. Skin: Negative for rash. Neurological: Negative for headaches, focal weakness or numbness.  ____________________________________________   PHYSICAL EXAM:  VITAL SIGNS: ED  Triage Vitals  Enc Vitals Group     BP 01/13/15 1735 152/85 mmHg  Pulse Rate 01/13/15 1735 109     Resp 01/13/15 1735 26     Temp 01/13/15 1735 99.1 F (37.3 C)     Temp Source 01/13/15 1735 Oral     SpO2 01/13/15 1735 95 %     Weight 01/13/15 1735 89 lb (40.37 kg)     Height 01/13/15 1735 5' 4.5" (1.638 m)     Head Cir --      Peak Flow --      Pain Score 01/13/15 1736 10     Pain Loc --      Pain Edu? --      Excl. in [place]? --      Constitutional: Alert and oriented. Chronically ill-appearing, but in no acute distress. Eyes: Conjunctivae are normal. PERRL. Normal extraocular movements. ENT   Head: Normocephalic and atraumatic. She has a large soft tissue mass in the left for head with scab crusting.   Nose: No congestion/rhinnorhea.   Mouth/Throat: Mucous membranes are mildly dry.   Neck: No stridor. Cardiovascular: Irregularly irregular and tachycardic.  No murmurs, rubs, or gallops. Respiratory: Mild rhonchi and mild wheezes throughout all fields. Normal respiratory effort without retractions. Gastrointestinal: Soft. No distention, no guarding, no rebound. Nontender  Genitourinary/rectal: Deferred Musculoskeletal: Nontender with normal range of motion in all extremities. No joint effusions.  No lower extremity tenderness nor edema. Left foot with bandage applied which is clean dry and intact. Neurologic:  Normal speech and language. No gross focal neurologic deficits are appreciated. Skin:  Skin is warm, dry and intact. No rash noted. Psychiatric: Mood and affect are normal. Speech and behavior are normal. Patient exhibits appropriate insight and judgment.  ____________________________________________   EKG I, Lisa Roca, MD, the attending physician have personally viewed and interpreted all ECGs.  109 beats rate a sinus tachycardia. Narrow QRS. Normal axis. Q waves inferiorly. Nonspecific T wave. Some ST depression V2 through  V5. ____________________________________________  LABS (pertinent positives/negatives)  Metabolic panel significant for potassium of 2.6, troponin 21.38 Lactic acid 1.4 White blood count 12.9 with a hemoglobin of 12.7  ____________________________________________  RADIOLOGY Imaging viewed by me, and interpreted by Radiologist   Chest x-ray one view portable: No change from recent prior studies __________________________________________  PROCEDURES  Procedure(s) performed: None Critical Care performed: None  ____________________________________________   ED COURSE / ASSESSMENT AND PLAN  Pertinent labs & imaging results that were available during my care of the patient were reviewed by me and considered in my medical decision making (see chart for details).  Clinically I suspected a possible pneumonia based on her symptoms, however her x-ray is negative for an infiltrate, and she's not hypoxic, so therefore don't think this is a possible cause. Laboratory evaluation showed the troponin came back elevated at 21 consistent with an NSTEMI. Her potassium is also extremely low. She was given by mouth and IV replacement of her potassium. She was given aspirin by mouth and heparin bolus and drip IV. Discussed with the hospitalist for admission.     ___________________________________________   FINAL CLINICAL IMPRESSION(S) / ED DIAGNOSES   Final diagnoses:  Non-ST elevation (NSTEMI) myocardial infarction      Lisa Roca, MD 01/13/15 1933

## 2015-01-13 NOTE — ED Notes (Signed)
Pt via ems from home c/o chest pain and "pain all over." Pt states that she is concerned she might be having "another heart attack." States that she has been having symptoms of chest pain and SOB x 2 days.

## 2015-01-13 NOTE — ED Notes (Signed)
Pt c/o chest pain and pain all over. States that she has been sick for a few days and is worried about heart issues, as she has prior cardiac hx. Pt alert & oriented.

## 2015-01-13 NOTE — H&P (Signed)
[facility] [place] NAME: Lynn Butler    MR#:  [id number]  DATE OF BIRTH:  [birth year]  DATE OF ADMISSION:  01/13/2015  PRIMARY CARE PHYSICIAN: Dr. Petra Kuba  REQUESTING/REFERRING PHYSICIAN: Lisa Roca  CHIEF COMPLAINT:   Chief Complaint  Patient presents with  . Chest Pain    HISTORY OF PRESENT ILLNESS:  Lynn Butler  is a 69 y.o. female with a known history of end-stage COPD with chronic respiratory failure, coronary artery disease, peripheral vascular disease, hyperlipidemia. Over the last few days she's had nausea vomiting and diarrhea where she's been in the bathroom quite often. She is unable to keep anything down. She has been unable to sleep. Then her breathing started acting up with wheezing and shortness of breath. And then she also developed chest pain and hurting in the chest sharp in nature severe 10 out of 10 in intensity and that has been constant for the past 2 days. Nothing has made it better or worse. She states that she also had a fever of 101 and cold sweats that she's been really weak.  PAST MEDICAL HISTORY:   Past Medical History  Diagnosis Date  . COPD (chronic obstructive pulmonary disease)   . Hypertension   . Ischemic cardiomyopathy     a.   . Coronary artery disease     a. s/p multiple stenting in 2005  . History of ventricular tachycardia   . PVD (peripheral vascular disease)   . PAD (peripheral artery disease)   . HLD (hyperlipidemia)   . Chronic back pain   . MI, old   . MRSA (methicillin resistant staph aureus) culture positive     left foot wound  . CHF (congestive heart failure)     PAST SURGICAL HISTORY:   Past Surgical History  Procedure Laterality Date  . Insert / replace / remove pacemaker    . Vascular bypass surgery Bilateral     SOCIAL HISTORY:   History  Substance Use Topics  . Smoking status: Current Every Day Smoker -- 0.50 packs/day    Types: Cigarettes  .  Smokeless tobacco: Not on file  . Alcohol Use: No    FAMILY HISTORY:   Family History  Problem Relation Age of Onset  . CAD Other   . Breast cancer Mother   . Heart disease Mother   . Coronary artery disease Father     DRUG ALLERGIES:   Allergies  Allergen Reactions  . Nsaids Nausea And Vomiting and Other (See Comments)    Reaction:  GI distress and burning     REVIEW OF SYSTEMS:  CONSTITUTIONAL: Positive for fever, positive for chills and sweats. Positive for weight loss.  EYES: No blurred or double vision. Needs glasses. EARS, NOSE, AND THROAT: No tinnitus or ear pain. No sore throat. Positive for runny nose RESPIRATORY: Positive for cough and shortness of breath, positive for wheezing, no hemoptysis.  CARDIOVASCULAR: Positive for chest pain, no orthopnea.  GASTROINTESTINAL: Positive for nausea, vomiting, diarrhea and abdominal pain. No blood in bowel movements. GENITOURINARY: No dysuria, hematuria.  ENDOCRINE: No polyuria, nocturia,  HEMATOLOGY: No anemia, easy bruising or bleeding SKIN: Skin cancers on head and face. Ulcer left lower extremity MUSCULOSKELETAL: Positive for joint pains.  NEUROLOGIC: No tingling, numbness, weakness.  PSYCHIATRY: No anxiety or depression.   MEDICATIONS AT HOME:   Prior to Admission medications   Medication Sig Start Date End Date Taking? Authorizing Provider  albuterol (PROAIR HFA) 108 (90  BASE) MCG/ACT inhaler Inhale 1-2 puffs into the lungs every 4 (four) hours as needed for wheezing or shortness of breath. 07/28/14  Yes Marijean Heath, NP  albuterol (PROVENTIL) (2.5 MG/3ML) 0.083% nebulizer solution Take 2.5 mg by nebulization every 6 (six) hours as needed for wheezing or shortness of breath.   Yes Historical Provider, MD  amLODipine (NORVASC) 5 MG tablet Take 5 mg by mouth daily.   Yes Historical Provider, MD  clopidogrel (PLAVIX) 75 MG tablet Take 75 mg by mouth daily.   Yes Historical Provider, MD  diazepam (VALIUM) 10 MG  tablet Take 1 tablet (10 mg total) by mouth every 4 (four) hours as needed for anxiety. 12/19/14  Yes Dustin Flock, MD  Fluticasone Furoate-Vilanterol (BREO ELLIPTA) 100-25 MCG/INH AEPB Inhale 1 puff into the lungs daily.   Yes Historical Provider, MD  furosemide (LASIX) 20 MG tablet Take 20 mg by mouth daily as needed for edema.   Yes Historical Provider, MD  HYDROmorphone (DILAUDID) 4 MG tablet Take 1 tablet (4 mg total) by mouth every 6 (six) hours as needed for severe pain. 12/19/14  Yes Dustin Flock, MD  LORazepam (ATIVAN) 1 MG tablet Take 1 mg by mouth every 8 (eight) hours as needed for anxiety.   Yes Historical Provider, MD  nitroGLYCERIN (NITROSTAT) 0.4 MG SL tablet Place 0.4 mg under the tongue every 5 (five) minutes as needed for chest pain.   Yes Historical Provider, MD  OxyCODONE (OXYCONTIN) 80 mg T12A 12 hr tablet Take 1 tablet (80 mg total) by mouth every 12 (twelve) hours. 12/19/14  Yes Dustin Flock, MD  traMADol (ULTRAM) 50 MG tablet Take 1 tablet (50 mg total) by mouth every 6 (six) hours as needed. Patient taking differently: Take 50 mg by mouth every 6 (six) hours as needed for moderate pain.  01/09/15  Yes Melynda Ripple, MD  zolpidem (AMBIEN) 10 MG tablet Take 0.5 tablets (5 mg total) by mouth at bedtime as needed for sleep. Patient taking differently: Take 10 mg by mouth at bedtime as needed for sleep.  07/28/14  Yes Marijean Heath, NP  Chlorhexidine Gluconate Cloth 2 % PADS Apply 6 each topically daily at 6 (six) AM. Patient not taking: Reported on 01/13/2015 12/30/14   Fritzi Mandes, MD  collagenase (SANTYL) ointment Apply topically daily. Patient not taking: Reported on 01/13/2015 12/19/14   Dustin Flock, MD  gabapentin (NEURONTIN) 300 MG capsule 1 tab po at bedtime 1st day, 1 tablet bid second day, then 1 tablet tid Patient not taking: Reported on 01/13/2015 01/09/15   Melynda Ripple, MD  predniSONE (DELTASONE) 10 MG tablet Take 1 tablet (10 mg total) by mouth daily  with breakfast. Label  & dispense according to the schedule below.  6 tablets day one, then 5 table day 2, then 4 tablets day 3, then 3 tablets day 4, 2 tablets day 5, then 1 tablet day 6, then stop Patient not taking: Reported on 01/13/2015 12/19/14   Dustin Flock, MD      VITAL SIGNS:  Blood pressure 149/87, pulse 104, temperature 99.1 F (37.3 C), temperature source Oral, resp. rate 23, height 5' 4.5" (1.638 m), weight 40.37 kg (89 lb), SpO2 99 %.  PHYSICAL EXAMINATION:  GENERAL:  68 y.o.-year-old patient lying in the bed with no acute distress.  EYES: Pupils equal, round, reactive to light and accommodation. No scleral icterus. Extraocular muscles intact.  HEENT: Head atraumatic, normocephalic. Oropharynx and nasopharynx clear.  NECK:  Supple, no jugular venous distention.  No thyroid enlargement, no tenderness.  LUNGS: Decreased breath sounds bilaterally, positive for wheezing, no rales,rhonchi or crepitation. No use of accessory muscles of respiration.  CARDIOVASCULAR: S1, S2 normal. No murmurs, rubs, or gallops.  ABDOMEN: Soft, tender generalized, ventral hernia, nondistended. Bowel sounds present. No organomegaly or mass.  EXTREMITIES: No pedal edema, cyanosis, or clubbing.  NEUROLOGIC: Cranial nerves II through XII are intact. Muscle strength 5/5 in all extremities. Sensation intact. Gait not checked.  PSYCHIATRIC: The patient is alert and oriented x 3.  SKIN: Large cancerous looking lesion over the left forehand, cancerous lesion left nose and right cheek and right forehead. Ulcer left lower extremity  LABORATORY PANEL:   CBC  Recent Labs Lab 01/13/15 1830  WBC 12.9*  HGB 12.7  HCT 41.4  PLT 316   Chemistries   Recent Labs Lab 01/13/15 1830  NA 135  K 2.6*  CL 97*  CO2 29  GLUCOSE 109*  BUN 7  CREATININE 0.73  CALCIUM 8.1*  MG 1.7  AST 30  ALT 21  ALKPHOS 92  BILITOT 0.5   Cardiac Enzymes  Recent Labs Lab 01/13/15 1830  TROPONINI 21.38*    RADIOLOGY:  Dg Chest Port 1 View  01/13/2015   CLINICAL DATA:  Chest pain.  Initial encounter.  EXAM: PORTABLE CHEST - 1 VIEW  COMPARISON:  12/28/2014 and 12/16/2014 radiographs.  FINDINGS: 1833 hour. Left subclavian AICD appears unchanged at the right ventricular apex. The heart size and mediastinal contours are stable with aortic arch atherosclerosis. The lungs are hyperinflated. Patchy bibasilar opacities are unchanged, likely atelectasis. There is minimal subpleural density laterally in the right mid lung which appears unchanged, probably scarring. Right cervical rib noted.  IMPRESSION: No change from recent prior studies. Probable bibasilar atelectasis.   Electronically Signed   By: Richardean Sale M.D.   On: 01/13/2015 18:58    EKG:   Sinus tachycardia 109 bpm left atrial enlargement and Q waves inferiorly and flattening T waves laterally  IMPRESSION AND PLAN:   1. Acute myocardial infarction with a troponin greater than 21. I spoke with Dr. Ubaldo Glassing cardiology. We are limited with her treatments. I will start heparin drip. Continue Plavix. Patient has an allergy to NSAIDs. No beta blocker secondary to bronchospasm. We'll start statin and check lipid profile. 2. Nausea, vomiting, diarrhea and abdominal pain-I will send off stool for C. difficile since the patient recently had antibiotics. Patient is not a surgical candidate for her abdominal ventral hernia. 3. Hypokalemia and hypomagnesemia- I will place magnesium IV and potassium in IV fluids and oral potassium replacement. 4. Chronic pain syndrome we'll give IV and oral pain medications. 5. 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 She may have baseline wheezing. I will hold off on antibiotics. Start Solu-Medrol. Continue nebulizer treatments. 6. Peripheral vascular disease with ulcer of the left lower extremity- patient is on Plavix. Ulcer is healing. 7  tobacco abuse smoking cessation counseling done 3 minutes by me. Nicotine patch applied. 8. Likely basal cell cancers over the face. These are too big for a dermatologist to remove. Not a candidate for plastic surgery operation secondary to multiple medical issues.    All the records are reviewed and case discussed with ED provider. Management plans discussed with the patient, family and  she is in agreement.  CODE STATUS: Full code  TOTAL TIME TAKING CARE OF THIS PATIENT: 50  minutes.    Loletha Grayer M.D on  01/13/2015 at 8:08 PM  Between 7am to 6pm - Pager - [phone]  After 6pm call admission pager [facility]  Office  [phone]  CC: Primary care physician; Dr. Petra Kuba in [place]

## 2015-01-14 ENCOUNTER — Encounter: Payer: Self-pay | Admitting: Cardiovascular Disease

## 2015-01-14 ENCOUNTER — Encounter
Admission: EM | Disposition: A | Discharge status: Home / Self Care | Payer: Self-pay | Source: Home / Self Care | Attending: Internal Medicine

## 2015-01-14 DIAGNOSIS — I2510 Atherosclerotic heart disease of native coronary artery without angina pectoris: Secondary | ICD-10-CM

## 2015-01-14 HISTORY — PX: CARDIAC CATHETERIZATION: SHX172

## 2015-01-14 LAB — CBC
HCT: 36 % (ref 35.0–47.0)
HEMOGLOBIN: 11.3 g/dL — AB (ref 12.0–16.0)
MCH: 23 pg — AB (ref 26.0–34.0)
MCHC: 31.3 g/dL — ABNORMAL LOW (ref 32.0–36.0)
MCV: 73.3 fL — AB (ref 80.0–100.0)
Platelets: 292 10*3/uL (ref 150–440)
RBC: 4.91 MIL/uL (ref 3.80–5.20)
RDW: 19.5 % — ABNORMAL HIGH (ref 11.5–14.5)
WBC: 5.3 10*3/uL (ref 3.6–11.0)

## 2015-01-14 LAB — URINALYSIS COMPLETE WITH MICROSCOPIC (ARMC ONLY)
Bilirubin Urine: NEGATIVE
Glucose, UA: NEGATIVE mg/dL
Hgb urine dipstick: NEGATIVE
Ketones, ur: NEGATIVE mg/dL
Nitrite: NEGATIVE
PH: 6 (ref 5.0–8.0)
PROTEIN: NEGATIVE mg/dL
Specific Gravity, Urine: 1.01 (ref 1.005–1.030)

## 2015-01-14 LAB — LIPID PANEL
CHOL/HDL RATIO: 3 ratio
CHOLESTEROL: 176 mg/dL (ref 0–200)
HDL: 58 mg/dL (ref >40)
LDL Cholesterol: 104 mg/dL — ABNORMAL HIGH (ref 0–99)
Triglycerides: 72 mg/dL (ref <150)
VLDL: 14 mg/dL (ref 0–40)

## 2015-01-14 LAB — BASIC METABOLIC PANEL
Anion gap: 7 (ref 5–15)
BUN: 12 mg/dL (ref 6–20)
CHLORIDE: 103 mmol/L (ref 101–111)
CO2: 28 mmol/L (ref 22–32)
Calcium: 8.3 mg/dL — ABNORMAL LOW (ref 8.9–10.3)
Creatinine, Ser: 0.86 mg/dL (ref 0.44–1.00)
GLUCOSE: 130 mg/dL — AB (ref 65–99)
POTASSIUM: 5.2 mmol/L — AB (ref 3.5–5.1)
SODIUM: 138 mmol/L (ref 135–145)

## 2015-01-14 LAB — TROPONIN I
TROPONIN I: 9.61 ng/mL — AB (ref <0.031)
Troponin I: 13.5 ng/mL — ABNORMAL HIGH (ref <0.031)

## 2015-01-14 LAB — HEPARIN LEVEL (UNFRACTIONATED)
Heparin Unfractionated: <0.1 IU/mL — ABNORMAL LOW (ref 0.30–0.70)
Heparin Unfractionated: <0.1 IU/mL — ABNORMAL LOW (ref 0.30–0.70)

## 2015-01-14 SURGERY — LEFT HEART CATH
Anesthesia: Moderate Sedation

## 2015-01-14 MED ORDER — HEPARIN (PORCINE) IN NACL 2-0.9 UNIT/ML-% IJ SOLN
INTRAMUSCULAR | Status: AC
  Filled 2015-01-14: qty 1000

## 2015-01-14 MED ORDER — SODIUM CHLORIDE 0.9 % IJ SOLN: 3.0000 mL | INTRAMUSCULAR | Status: DC | PRN

## 2015-01-14 MED ORDER — SODIUM CHLORIDE 0.9 % IV SOLN
INTRAVENOUS | Status: DC
  Administered 2015-01-14: 10:00:00 via INTRAVENOUS

## 2015-01-14 MED ORDER — SODIUM CHLORIDE 0.9 % IV SOLN: 250.0000 mL | INTRAVENOUS | Status: DC | PRN

## 2015-01-14 MED ORDER — ENSURE ENLIVE PO LIQD
237.0000 mL | Freq: Three times a day (TID) | ORAL | Status: DC
  Administered 2015-01-15 – 2015-01-17 (×7): 237 mL via ORAL

## 2015-01-14 MED ORDER — SODIUM CHLORIDE 0.9 % IJ SOLN
3.0000 mL | Freq: Two times a day (BID) | INTRAMUSCULAR | Status: DC
  Administered 2015-01-14: 3 mL via INTRAVENOUS

## 2015-01-14 MED ORDER — HEPARIN BOLUS VIA INFUSION
1200.0000 [IU] | Freq: Once | INTRAVENOUS | Status: AC
  Administered 2015-01-14: 1200 [IU] via INTRAVENOUS
  Filled 2015-01-14: qty 1200

## 2015-01-14 MED ORDER — FENTANYL CITRATE (PF) 100 MCG/2ML IJ SOLN
INTRAMUSCULAR | Status: AC
  Filled 2015-01-14: qty 2

## 2015-01-14 MED ORDER — IOHEXOL 300 MG/ML  SOLN
INTRAMUSCULAR | Status: DC | PRN
  Administered 2015-01-14: 30 mL via INTRA_ARTERIAL

## 2015-01-14 MED ORDER — SODIUM CHLORIDE 0.9 % IV SOLN: INTRAVENOUS | Status: DC

## 2015-01-14 MED ORDER — NICOTINE 21 MG/24HR TD PT24
21.0000 mg | MEDICATED_PATCH | Freq: Every day | TRANSDERMAL | Status: DC
  Administered 2015-01-14 – 2015-01-17 (×4): 21 mg via TRANSDERMAL
  Filled 2015-01-14 (×4): qty 1

## 2015-01-14 MED ORDER — MIDAZOLAM HCL 2 MG/2ML IJ SOLN
INTRAMUSCULAR | Status: DC | PRN
  Administered 2015-01-14 (×3): 0.5 mg via INTRAVENOUS

## 2015-01-14 MED ORDER — MIDAZOLAM HCL 2 MG/2ML IJ SOLN
INTRAMUSCULAR | Status: AC
  Filled 2015-01-14: qty 2

## 2015-01-14 MED ORDER — SODIUM CHLORIDE 0.9 % IJ SOLN: 3.0000 mL | Freq: Two times a day (BID) | INTRAMUSCULAR | Status: DC

## 2015-01-14 MED ORDER — FENTANYL CITRATE (PF) 100 MCG/2ML IJ SOLN
INTRAMUSCULAR | Status: DC | PRN
  Administered 2015-01-14 (×3): 25 ug via INTRAVENOUS

## 2015-01-14 SURGICAL SUPPLY — 10 items
CANNULA 5F STIFF (CANNULA) ×3 IMPLANT
CATH INFINITI 5FR ANG PIGTAIL (CATHETERS) ×3 IMPLANT
CATH INFINITI 5FR JL4 (CATHETERS) ×3 IMPLANT
CATH INFINITI JR4 5F (CATHETERS) ×3 IMPLANT
KIT MANI 3VAL PERCEP (MISCELLANEOUS) ×3 IMPLANT
NEEDLE PERC 18GX7CM (NEEDLE) ×3 IMPLANT
PACK CARDIAC CATH (CUSTOM PROCEDURE TRAY) ×3 IMPLANT
SHEATH AVANTI 5FR X 11CM (SHEATH) ×3 IMPLANT
WIRE EMERALD 3MM-J .035X150CM (WIRE) ×3 IMPLANT
WIRE HITORQ VERSACORE ST 145CM (WIRE) ×3 IMPLANT

## 2015-01-14 NOTE — Consult Note (Addendum)
CARDIOLOGY CONSULT NOTE  Patient ID: Lynn Butler MRN: [id number] DOB/AGE: [birth year] 69 y.o.  Admit date: 01/13/2015 Referring Physician Earleen Newport Primary Physician [facility] Primary Cardiologist [facility] Reason for Consultation Elevated troponin/nstemi  HPI: Pt is a 69 yo female with multiple medical problems including cad s/p pci at [facility] in 2005 with at least one stent in the circumflex distribution (Cath data currently not available), severe oxygen dependent copd, pvd s/p bilateral fem pop bypasses, ischemic cardiomyopathy with ef approximately 20-25%, vt with aicd in place followed at [facility], chronic pain syndrome, continued chronic tobacco abuse who is s/p recent  Surgical release of sbo and lysis of adhesions 4/16. She presented to [facility] with complaints of nausea, vomiting, diarrhea, chest pain for several days. She was noted to be profoundly hypokalemic with a serum potassium of 2.6 and had an elevated serum troponin  Of 20 with subsequent troponin 17. She is currently hemodynamically stable and appears to be resting comfortably. She still states she has further chest pain. She has history of non healing ulcers on her coccyx and heal which has been slowly improving. She has history of mrsa and has blood and urine and stool cultures pending. 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 The left femoral pulse is 2+ with bruit. Distal pulses non palpable. She reports compliance with her meds. She is treated with asa, plavix. She is not a cnadidate for beta blockers due to severe copd and active bronchospasm in the past with beta blockers. She is currently on iv heparin.   ROS Review of Systems - General ROS: positive for  - fatigue Psychological ROS: positive for - anxiety Respiratory ROS: positive for - shortness of breath and wheezing Cardiovascular ROS: positive for - chest pain, dyspnea on exertion and shortness of breath large aneurysmal femoral artery iin right  groin. Brisk pulse in left femaoral artery with 2+ bruit. Distal pulses +/- Gastrointestinal ROS: positive for - appetite loss, change in stools, diarrhea and nausea/vomiting Musculoskeletal ROS: positive for - muscle pain Neurological ROS: no TIA or stroke symptoms Dermatological ROS: positive for skin lesion changes   Past Medical History  Diagnosis Date  . COPD (chronic obstructive pulmonary disease)   . Hypertension   . Ischemic cardiomyopathy     a.   . Coronary artery disease     a. s/p multiple stenting in 2005  . History of ventricular tachycardia   . PVD (peripheral vascular disease)   . PAD (peripheral artery disease)   . HLD (hyperlipidemia)   . Chronic back pain   . MI, old   . MRSA (methicillin resistant staph aureus) culture positive     left foot wound  . CHF (congestive heart failure)     Family History  Problem Relation Age of Onset  . CAD Other   . Breast cancer Mother   . Heart disease Mother   . Coronary artery disease Father     History   Social History  . Marital Status: Widowed    Spouse Name: N/A  . Number of Children: N/A  . Years of Education: N/A   Occupational History  . Not on file.   Social History Main Topics  . Smoking status: Current Every Day Smoker -- 0.50 packs/day    Types: Cigarettes  . Smokeless tobacco: Not on file  . Alcohol Use: No  . Drug Use: No  . Sexual Activity: Not on file   Other Topics Concern  .  Not on file   Social History Narrative    Past Surgical History  Procedure Laterality Date  . Insert / replace / remove pacemaker    . Vascular bypass surgery Bilateral      Prescriptions prior to admission  Medication Sig Dispense Refill Last Dose  . albuterol (PROAIR HFA) 108 (90 BASE) MCG/ACT inhaler Inhale 1-2 puffs into the lungs every 4 (four) hours as needed for wheezing or shortness of breath.   PRN at PRN  . albuterol (PROVENTIL) (2.5 MG/3ML) 0.083% nebulizer solution Take 2.5 mg by nebulization every  6 (six) hours as needed for wheezing or shortness of breath.   PRN at PRN  . amLODipine (NORVASC) 5 MG tablet Take 5 mg by mouth daily.   unknown at unknown  . clopidogrel (PLAVIX) 75 MG tablet Take 75 mg by mouth daily.   unknown at unknown  . diazepam (VALIUM) 10 MG tablet Take 1 tablet (10 mg total) by mouth every 4 (four) hours as needed for anxiety. 30 tablet 0 PRN at PRN  . Fluticasone Furoate-Vilanterol (BREO ELLIPTA) 100-25 MCG/INH AEPB Inhale 1 puff into the lungs daily.   unknown at unknown  . furosemide (LASIX) 20 MG tablet Take 20 mg by mouth daily as needed for edema.   PRN at PRN  . HYDROmorphone (DILAUDID) 4 MG tablet Take 1 tablet (4 mg total) by mouth every 6 (six) hours as needed for severe pain. 40 tablet 0 PRN at PRN  . LORazepam (ATIVAN) 1 MG tablet Take 1 mg by mouth every 8 (eight) hours as needed for anxiety.   PRN at PRN  . nitroGLYCERIN (NITROSTAT) 0.4 MG SL tablet Place 0.4 mg under the tongue every 5 (five) minutes as needed for chest pain.   PRN at PRN  . OxyCODONE (OXYCONTIN) 80 mg T12A 12 hr tablet Take 1 tablet (80 mg total) by mouth every 12 (twelve) hours. 60 tablet 0 unknown at unknown  . traMADol (ULTRAM) 50 MG tablet Take 1 tablet (50 mg total) by mouth every 6 (six) hours as needed. (Patient taking differently: Take 50 mg by mouth every 6 (six) hours as needed for moderate pain. ) 15 tablet 0 PRN at PRN  . zolpidem (AMBIEN) 10 MG tablet Take 0.5 tablets (5 mg total) by mouth at bedtime as needed for sleep. (Patient taking differently: Take 10 mg by mouth at bedtime as needed for sleep. )  0 PRN at PRN  . Chlorhexidine Gluconate Cloth 2 % PADS Apply 6 each topically daily at 6 (six) AM. (Patient not taking: Reported on 01/13/2015) 6 each 0   . collagenase (SANTYL) ointment Apply topically daily. (Patient not taking: Reported on 01/13/2015) 15 g 0 12/28/2014 at 0630  . gabapentin (NEURONTIN) 300 MG capsule 1 tab po at bedtime 1st day, 1 tablet bid second day, then 1  tablet tid (Patient not taking: Reported on 01/13/2015) 20 capsule 0   . predniSONE (DELTASONE) 10 MG tablet Take 1 tablet (10 mg total) by mouth daily with breakfast. Label  & dispense according to the schedule below.  6 tablets day one, then 5 table day 2, then 4 tablets day 3, then 3 tablets day 4, 2 tablets day 5, then 1 tablet day 6, then stop (Patient not taking: Reported on 01/13/2015) 21 tablet 0 Completed Course at Unknown time    Physical Exam: Blood pressure 97/65, pulse 78, temperature 97.7 F (36.5 C), temperature source Oral, resp. rate 18, height 5' 4.5" (1.638 m), weight  41.595 kg (91 lb 11.2 oz), SpO2 100 %.  General appearance: cooperative Resp: rhonchi bilaterally and wheezes bilaterally Chest wall: no tenderness, left sided chest wall tenderness Cardio: prominent apical impulse, regular rate and rhythm and systolic murmur: systolic ejection 2/6, blowing at apex GI: abnormal findings:  hypoactive bowel sounds and moderate tenderness in the periumbilical area Extremities: no edema. Healing decubiti Pulses: Right Pulses: FEM: bounding and aneurysmal femoral artery, POP: present 1+, DP: absent, PT: absent Left Pulses: FEM: present 2+, POP: present 1+, DP: absent, PT: absent Skin: scar - scalp, abdomen, groin and healing sacral and heal ulcers Neurologic: Alert and oriented X 3, normal strength and tone. Normal symmetric reflexes. Normal coordination and gait Incision/Wound:healing wound in sacrum and heals. Lesion over left eye.  Labs:   Lab Results  Component Value Date   WBC 12.9* 01/13/2015   HGB 12.7 01/13/2015   HCT 41.4 01/13/2015   MCV 72.2* 01/13/2015   PLT 316 01/13/2015    Recent Labs Lab 01/13/15 1830  NA 135  K 2.6*  CL 97*  CO2 29  BUN 7  CREATININE 0.73  CALCIUM 8.1*  PROT 6.8  BILITOT 0.5  ALKPHOS 92  ALT 21  AST 30  GLUCOSE 109*   Lab Results  Component Value Date   TROPONINI 13.50* 01/14/2015      Radiology:   EKG: sr with inferior q  waves. No change from previous ekg.   ASSESSMENT AND PLAN:  Pt with multiple medical problems including cad s/p pci, ischemic cardiomyopathy with ef of 25% with aicd in  Place followed [facility], history of severe oxygen dependent copd, histoyry of severe pvd s/p bilateral fem-pop bypasses with poor dital run off, history of aneurysmal right femoral artery, history of mrsa in the past for poorly healing decubiti who was admitted with several days of nausea, vomiting, diarrhea and chest pain and appears to have ruled in for a nstemi likely occurring several days ago based on elevated serum troponin on admission with decling values since. EKG shows persistant q waves ineriorly. Work up at [facility]  Over the past 12-18 months have includied an funcitonal study showing large fixed infeior apical defect with no reversible ischemia. Cardiac pet scan showed large inferior, apical and inferoseptal akinesis. Will obtain previous cath data from [facility] but cardiac ct in the past revealed a stent in ght circ. K was severly reduced at 2.6 on admission. Renal funciton was normal. K is being replaced. Will review cxr, electrolytes and previous anatomy . Very high risk for invasive evaluation and intervention. Pt has severe cardiomyopathy with infeior and apical scar. She had her nstemi at least 24-72 hours ago based on tropinin levels. Doubt single vessel pci would significantly alter her cardiomyopathy. Will discuss case interdepartmentally and deterine continued medical therapy vs invasive evalaution. Continue with heparin and asa and plavix. Repleat k as you are doing. WIll review cultures  when as they become available. Will reassess later this am.  Signed: Teodoro Spray MD, [facility] 01/14/2015, 7:52 AM    Potassium supplemented.  Discuss cardiac catheterization with the patient.  Will proceed with cardiac catheterization either from radial or left femoral approach later this morning to guide further therapy.  Cardiac  catheterization revealed patent rca stent with occluded distal rca stent. Diffusely diseased lcx. Medical manangement.

## 2015-01-14 NOTE — H&P (View-Only) (Signed)
CARDIOLOGY CONSULT NOTE  Patient ID: Lynn Butler MRN: [id number] DOB/AGE: Oct 20, 1945 69 y.o.  Admit date: 01/13/2015 Referring Physician Earleen Newport Primary Physician [facility] Primary Cardiologist [facility] Reason for Consultation Elevated troponin/nstemi  HPI: Pt is a 69 yo female with multiple medical problems including cad s/p pci at [facility] in 2005 with at least one stent in the circumflex distribution (Cath data currently not available), severe oxygen dependent copd, pvd s/p bilateral fem pop bypasses, ischemic cardiomyopathy with ef approximately 20-25%, vt with aicd in place followed at [facility], chronic pain syndrome, continued chronic tobacco abuse who is s/p recent  Surgical release of sbo and lysis of adhesions 4/16. She presented to [facility] with complaints of nausea, vomiting, diarrhea, chest pain for several days. She was noted to be profoundly hypokalemic with a serum potassium of 2.6 and had an elevated serum troponin  Of 20 with subsequent troponin 17. She is currently hemodynamically stable and appears to be resting comfortably. She still states she has further chest pain. She has history of non healing ulcers on her coccyx and heal which has been slowly improving. She has history of mrsa and has blood and urine and stool cultures pending. 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 The left femoral pulse is 2+ with bruit. Distal pulses non palpable. She reports compliance with her meds. She is treated with asa, plavix. She is not a cnadidate for beta blockers due to severe copd and active bronchospasm in the past with beta blockers. She is currently on iv heparin.   ROS Review of Systems - General ROS: positive for  - fatigue Psychological ROS: positive for - anxiety Respiratory ROS: positive for - shortness of breath and wheezing Cardiovascular ROS: positive for - chest pain, dyspnea on exertion and shortness of breath large aneurysmal femoral artery iin right  groin. Brisk pulse in left femaoral artery with 2+ bruit. Distal pulses +/- Gastrointestinal ROS: positive for - appetite loss, change in stools, diarrhea and nausea/vomiting Musculoskeletal ROS: positive for - muscle pain Neurological ROS: no TIA or stroke symptoms Dermatological ROS: positive for skin lesion changes   Past Medical History  Diagnosis Date  . COPD (chronic obstructive pulmonary disease)   . Hypertension   . Ischemic cardiomyopathy     a.   . Coronary artery disease     a. s/p multiple stenting in 2005  . History of ventricular tachycardia   . PVD (peripheral vascular disease)   . PAD (peripheral artery disease)   . HLD (hyperlipidemia)   . Chronic back pain   . MI, old   . MRSA (methicillin resistant staph aureus) culture positive     left foot wound  . CHF (congestive heart failure)     Family History  Problem Relation Age of Onset  . CAD Other   . Breast cancer Mother   . Heart disease Mother   . Coronary artery disease Father     History   Social History  . Marital Status: Widowed    Spouse Name: N/A  . Number of Children: N/A  . Years of Education: N/A   Occupational History  . Not on file.   Social History Main Topics  . Smoking status: Current Every Day Smoker -- 0.50 packs/day    Types: Cigarettes  . Smokeless tobacco: Not on file  . Alcohol Use: No  . Drug Use: No  . Sexual Activity: Not on file   Other Topics Concern  .  Not on file   Social History Narrative    Past Surgical History  Procedure Laterality Date  . Insert / replace / remove pacemaker    . Vascular bypass surgery Bilateral      Prescriptions prior to admission  Medication Sig Dispense Refill Last Dose  . albuterol (PROAIR HFA) 108 (90 BASE) MCG/ACT inhaler Inhale 1-2 puffs into the lungs every 4 (four) hours as needed for wheezing or shortness of breath.   PRN at PRN  . albuterol (PROVENTIL) (2.5 MG/3ML) 0.083% nebulizer solution Take 2.5 mg by nebulization every  6 (six) hours as needed for wheezing or shortness of breath.   PRN at PRN  . amLODipine (NORVASC) 5 MG tablet Take 5 mg by mouth daily.   unknown at unknown  . clopidogrel (PLAVIX) 75 MG tablet Take 75 mg by mouth daily.   unknown at unknown  . diazepam (VALIUM) 10 MG tablet Take 1 tablet (10 mg total) by mouth every 4 (four) hours as needed for anxiety. 30 tablet 0 PRN at PRN  . Fluticasone Furoate-Vilanterol (BREO ELLIPTA) 100-25 MCG/INH AEPB Inhale 1 puff into the lungs daily.   unknown at unknown  . furosemide (LASIX) 20 MG tablet Take 20 mg by mouth daily as needed for edema.   PRN at PRN  . HYDROmorphone (DILAUDID) 4 MG tablet Take 1 tablet (4 mg total) by mouth every 6 (six) hours as needed for severe pain. 40 tablet 0 PRN at PRN  . LORazepam (ATIVAN) 1 MG tablet Take 1 mg by mouth every 8 (eight) hours as needed for anxiety.   PRN at PRN  . nitroGLYCERIN (NITROSTAT) 0.4 MG SL tablet Place 0.4 mg under the tongue every 5 (five) minutes as needed for chest pain.   PRN at PRN  . OxyCODONE (OXYCONTIN) 80 mg T12A 12 hr tablet Take 1 tablet (80 mg total) by mouth every 12 (twelve) hours. 60 tablet 0 unknown at unknown  . traMADol (ULTRAM) 50 MG tablet Take 1 tablet (50 mg total) by mouth every 6 (six) hours as needed. (Patient taking differently: Take 50 mg by mouth every 6 (six) hours as needed for moderate pain. ) 15 tablet 0 PRN at PRN  . zolpidem (AMBIEN) 10 MG tablet Take 0.5 tablets (5 mg total) by mouth at bedtime as needed for sleep. (Patient taking differently: Take 10 mg by mouth at bedtime as needed for sleep. )  0 PRN at PRN  . Chlorhexidine Gluconate Cloth 2 % PADS Apply 6 each topically daily at 6 (six) AM. (Patient not taking: Reported on 01/13/2015) 6 each 0   . collagenase (SANTYL) ointment Apply topically daily. (Patient not taking: Reported on 01/13/2015) 15 g 0 12/28/2014 at 0630  . gabapentin (NEURONTIN) 300 MG capsule 1 tab po at bedtime 1st day, 1 tablet bid second day, then 1  tablet tid (Patient not taking: Reported on 01/13/2015) 20 capsule 0   . predniSONE (DELTASONE) 10 MG tablet Take 1 tablet (10 mg total) by mouth daily with breakfast. Label  & dispense according to the schedule below.  6 tablets day one, then 5 table day 2, then 4 tablets day 3, then 3 tablets day 4, 2 tablets day 5, then 1 tablet day 6, then stop (Patient not taking: Reported on 01/13/2015) 21 tablet 0 Completed Course at Unknown time    Physical Exam: Blood pressure 97/65, pulse 78, temperature 97.7 F (36.5 C), temperature source Oral, resp. rate 18, height 5' 4.5" (1.638 m), weight  41.595 kg (91 lb 11.2 oz), SpO2 100 %.  General appearance: cooperative Resp: rhonchi bilaterally and wheezes bilaterally Chest wall: no tenderness, left sided chest wall tenderness Cardio: prominent apical impulse, regular rate and rhythm and systolic murmur: systolic ejection 2/6, blowing at apex GI: abnormal findings:  hypoactive bowel sounds and moderate tenderness in the periumbilical area Extremities: no edema. Healing decubiti Pulses: Right Pulses: FEM: bounding and aneurysmal femoral artery, POP: present 1+, DP: absent, PT: absent Left Pulses: FEM: present 2+, POP: present 1+, DP: absent, PT: absent Skin: scar - scalp, abdomen, groin and healing sacral and heal ulcers Neurologic: Alert and oriented X 3, normal strength and tone. Normal symmetric reflexes. Normal coordination and gait Incision/Wound:healing wound in sacrum and heals. Lesion over left eye.  Labs:   Lab Results  Component Value Date   WBC 12.9* 01/13/2015   HGB 12.7 01/13/2015   HCT 41.4 01/13/2015   MCV 72.2* 01/13/2015   PLT 316 01/13/2015    Recent Labs Lab 01/13/15 1830  NA 135  K 2.6*  CL 97*  CO2 29  BUN 7  CREATININE 0.73  CALCIUM 8.1*  PROT 6.8  BILITOT 0.5  ALKPHOS 92  ALT 21  AST 30  GLUCOSE 109*   Lab Results  Component Value Date   TROPONINI 13.50* 01/14/2015      Radiology:   EKG: sr with inferior q  waves. No change from previous ekg.   ASSESSMENT AND PLAN:  Pt with multiple medical problems including cad s/p pci, ischemic cardiomyopathy with ef of 25% with aicd in  Place followed at [facility] for [facility], history of severe oxygen dependent copd, histoyry of severe pvd s/p bilateral fem-pop bypasses with poor dital run off, history of aneurysmal right femoral artery, history of mrsa in the past for poorly healing decubiti who was admitted with several days of nausea, vomiting, diarrhea and chest pain and appears to have ruled in for a nstemi likely occurring several days ago based on elevated serum troponin on admission with decling values since. EKG shows persistant q waves ineriorly. Work up at [facility]  Over the past 12-18 months have includied an funcitonal study showing large fixed infeior apical defect with no reversible ischemia. Cardiac pet scan showed large inferior, apical and inferoseptal akinesis. Will obtain [facility] cath data from [facility] but cardiac ct in the past revealed a stent in ght circ. K was severly reduced at 2.6 on admission. Renal funciton was normal. K is being replaced. Will review cxr, electrolytes and previous anatomy . Very high risk for invasive evaluation and intervention. Pt has severe cardiomyopathy with infeior and apical scar. She had her nstemi at least 24-72 hours ago based on tropinin levels. Doubt single vessel pci would significantly alter her cardiomyopathy. Will discuss case interdepartmentally and deterine continued medical therapy vs invasive evalaution. Continue with heparin and asa and plavix. Repleat k as you are doing. WIll review cultures  when as they become available. Will reassess later this am.  Signed: Teodoro Spray MD, [facility] 01/14/2015, 7:52 AM    Potassium supplemented.  Discuss cardiac catheterization with the patient.  Will proceed with cardiac catheterization either from radial or left femoral approach later this morning to guide further therapy.

## 2015-01-14 NOTE — Interval H&P Note (Signed)
History and Physical Interval Note:  01/14/2015 1:01 PM  Lynn Butler  has presented today for surgery, with the diagnosis of NSMI  The various methods of treatment have been discussed with the patient and family. After consideration of risks, benefits and other options for treatment, the patient has consented to  Procedure(s): Left Heart Cath (N/A) as a surgical intervention .  The patient's history has been reviewed, patient examined, no change in status, stable for surgery.  I have reviewed the patient's chart and labs.  Questions were answered to the patient's satisfaction.     Kathlyn Sacramento

## 2015-01-14 NOTE — Progress Notes (Signed)
Initial Nutrition Assessment  DOCUMENTATION CODES:  Severe malnutrition in context of chronic illness  INTERVENTION:  1) Meals/Snacks: cater to pt preferences; obtained pt order for lunch and called down; pt may benefit from smaller, more frequent meals 2) Medical Food Supplement: pt likes Ensure; recommend sending TID with meals (Ensure Enlive, each supplement provides 350 kcal and 20 grams of protein)   NUTRITION DIAGNOSIS:  Inadequate oral intake related to chronic illness as evidenced by energy intake < 75% for > or equal to 3 months, severe depletion of body fat, severe depletion of muscle mass.   GOAL:  Patient will meet greater than or equal to 90% of their needs   MONITOR:   (Energy Intake, Anthropometrics, Digestive System, Electroltye/Renal Profile)  REASON FOR ASSESSMENT:  Malnutrition Screening Tool    ASSESSMENT:  Pt admitted with chest pain with acute MI, N/V/D/abdominal pain  Past Medical History  Diagnosis Date  . COPD (chronic obstructive pulmonary disease)   . Hypertension   . Ischemic cardiomyopathy     a.   . Coronary artery disease     a. s/p multiple stenting in 2005  . History of ventricular tachycardia   . PVD (peripheral vascular disease)   . PAD (peripheral artery disease)   . HLD (hyperlipidemia)   . Chronic back pain   . MI, old   . MRSA (methicillin resistant staph aureus) culture positive     left foot wound  . CHF (congestive heart failure)    Diet Order: advanced to Heart Healthy, NPO this AM  Energy Intake: no intake yet, pt NPO this AM; hungry on visit today  Food/Nutrition-Intake History: pt reports she has not eaten anything for the last 4 days but intake has been down for a while. Pt reports she likes Ensure but only drinks when she can afford it. Based on reported wt loss, intake appears to have not meet needs for several years  Nutrition-Focused physical exam completed. Findings are severe fat depletion in all areas,  severe muscle depletion in all areas, and mild edema.   Digestive System: no N/V/D at present time  Meds: lasix, solumedrol  Height:  Ht Readings from Last 1 Encounters:  01/13/15 5' 4.5" (1.638 m)    Weight:  Wt Readings from Last 1 Encounters:  01/14/15 94 lb 6.4 oz (42.82 kg)    Pt reports she weighed around 185 pounds in 2005, gradual wt loss over time. Noted Pt weighed 107 pounds on admission in April. 12% wt loss.  Wt Readings from Last 10 Encounters:  01/14/15 94 lb 6.4 oz (42.82 kg)  01/09/15 90 lb (40.824 kg)  12/30/14 97 lb 9.6 oz (44.271 kg)  12/16/14 89 lb 14.4 oz (40.778 kg)  11/26/14 107 lb 1.6 oz (48.58 kg)  07/28/14 102 lb 15.3 oz (46.7 kg)    BMI:  Body mass index is 15.96 kg/(m^2). Underweight  Estimated Nutritional Needs:  Kcal:  1523-1780 kcals (BEE 1060, 1.2 AF, 1.2-1.4 IF)  Protein:  62-74 g (1.2-1.4 g/kg)   Fluid:  1350-1620 mL (25-30 ml/kg)   Skin:   noted stage I pressure ulcers   Intake/Output Summary (Last 24 hours) at 01/14/15 1556 Last data filed at 01/14/15 1200  Gross per 24 hour  Intake  87.37 ml  Output    200 ml  Net -112.63 ml   HIGH Care Level  Kerman Passey MS, RD, LDN [phone] Pager

## 2015-01-14 NOTE — Progress Notes (Signed)
   01/14/15 1030  Clinical Encounter Type  Visited With Patient  Visit Type Initial  Referral From Nurse  Consult/Referral To Chaplain  Spiritual Encounters  Spiritual Needs Prayer;Emotional  Stress Factors  Patient Stress Factors Exhausted;Health changes;Major life changes  Patient anxious about pending medical procedure. Prayed with patient who asked for follow up on Sunday.   Chap. Shayla Heming G. Talent

## 2015-01-14 NOTE — Consult Note (Signed)
WOC wound consult note Reason for Consult: Vascular ulcer to left anterior lower leg.  Cancerous lesions to face.  Wound type: vascular ulcer to left lower leg, stable.  Seen by [facility] nurse.  Scarring noted where wound healing has occurred.  Per patient, wound was debrided and graft placed two years ago.   Pressure Ulcer POA: N/A Measurement:Left lower leg 1.5 cm x 0.8 cm x 0.1 cm  Wound bed:100% pale pink nongranulating.  Drainage (amount, consistency, odor) Minimal serous exudate noted.  Periwound:Scarring circumferentially.  Dressing procedure/placement/frequency:Cleanse left lower leg with NS and pat gently dry.  Apply Mepitel silicone contact layer to wound bed.  Cover with 4x4 gauze, kerlix and tape.  Change Mon-Wed-Fri.  Will not follow at this time.  Please re-consult if needed.  Domenic Moras RN BSN [place] Pager [phone]

## 2015-01-14 NOTE — Progress Notes (Signed)
Report called to floor nurse by Gwenlyn Found.  Check right groin for bleeding or hematoma.  Patient will be on bedrest for 2 hours post sheath pull---out of bed at 15:50  Bilateral pulses are doppler --1 right DP and 1 doppler left popliteal

## 2015-01-14 NOTE — Progress Notes (Signed)
Notified Dr. Lavetta Nielsen of patient's inability to tolerate and therefore refusing any IV potassium. IV potassium stopped. Extra dose of PO potassium ordered and given.

## 2015-01-14 NOTE — Care Management (Signed)
Presents form home.  Has chronic home 02 and followed by [facility] and aide.  She has ruled in for nstemi and cath shows significant CAD. PCI.  Notified [facility] of admisison

## 2015-01-14 NOTE — Progress Notes (Signed)
[place] at [place] NAME: Lynn Butler    MR#:  [id number]   DATE OF BIRTH:  02/17/1946  SUBJECTIVE:  CHIEF COMPLAINT:   Chief Complaint  Patient presents with  . Chest Pain  having minimal chest pain but very hungry and wanting to eat  REVIEW OF SYSTEMS:  Review of Systems  Constitutional: Negative for fever, weight loss, malaise/fatigue and diaphoresis.  HENT: Negative for ear discharge, ear pain, hearing loss, nosebleeds, sore throat and tinnitus.   Eyes: Negative for blurred vision and pain.  Respiratory: Negative for cough, hemoptysis, shortness of breath and wheezing.   Cardiovascular: Positive for chest pain. Negative for palpitations, orthopnea and leg swelling.  Gastrointestinal: Positive for nausea. Negative for heartburn, vomiting, abdominal pain, diarrhea, constipation and blood in stool.  Genitourinary: Negative for dysuria, urgency and frequency.  Musculoskeletal: Negative for myalgias and back pain.  Skin: Negative for itching and rash.  Neurological: Negative for dizziness, tingling, tremors, focal weakness, seizures, weakness and headaches.  Psychiatric/Behavioral: Negative for depression. The patient is not nervous/anxious.    DRUG ALLERGIES:   Allergies  Allergen Reactions  . Nsaids Nausea And Vomiting and Other (See Comments)    Reaction:  GI distress and burning    VITALS:  Blood pressure 97/65, pulse 78, temperature 97.7 F (36.5 C), temperature source Oral, resp. rate 18, height 5' 4.5" (1.638 m), weight 41.595 kg (91 lb 11.2 oz), SpO2 100 %. PHYSICAL EXAMINATION:  Physical Exam  Constitutional: She is oriented to person, place, and time. She appears malnourished. She appears unhealthy. She appears cachectic.  HENT:  Head: Normocephalic and atraumatic.  Eyes: Conjunctivae and EOM are normal. Pupils are equal, round, and reactive to light.  Neck: Normal range of motion. Neck supple. No tracheal  deviation present. No thyromegaly present.  Cardiovascular: Normal rate, regular rhythm and normal heart sounds.   Pulmonary/Chest: Effort normal and breath sounds normal. No respiratory distress. She has no wheezes. She exhibits no tenderness.  Abdominal: Soft. Bowel sounds are normal. She exhibits no distension. There is no tenderness.  Musculoskeletal: Normal range of motion.  Neurological: She is alert and oriented to person, place, and time. No cranial nerve deficit.  Skin: Skin is warm and dry. No rash noted.     Large cancerous looking lesion over the left forehand, cancerous lesion left nose and right cheek and right forehead. Ulcer left lower extremity  Psychiatric: Mood and affect normal.   LABORATORY PANEL:   CBC  Recent Labs Lab 01/14/15 0738  WBC 5.3  HGB 11.3*  HCT 36.0  PLT 292   ------------------------------------------------------------------------------------------------------------------ Chemistries   Recent Labs Lab 01/13/15 1830 01/14/15 0738  NA 135 138  K 2.6* 5.2*  CL 97* 103  CO2 29 28  GLUCOSE 109* 130*  BUN 7 12  CREATININE 0.73 0.86  CALCIUM 8.1* 8.3*  MG 1.7  --   AST 30  --   ALT 21  --   ALKPHOS 92  --   BILITOT 0.5  --    RADIOLOGY:  Dg Chest Port 1 View  01/13/2015   CLINICAL DATA:  Chest pain.  Initial encounter.  EXAM: PORTABLE CHEST - 1 VIEW  COMPARISON:  12/28/2014 and 12/16/2014 radiographs.  FINDINGS: 1833 hour. Left subclavian AICD appears unchanged at the right ventricular apex. The heart size and mediastinal contours are stable with aortic arch atherosclerosis. The lungs are hyperinflated. Patchy bibasilar opacities are unchanged, likely atelectasis. There is minimal subpleural  density laterally in the right mid lung which appears unchanged, probably scarring. Right cervical rib noted.  IMPRESSION: No change from recent prior studies. Probable bibasilar atelectasis.   Electronically Signed   By: Richardean Sale M.D.   On:  01/13/2015 18:58   ASSESSMENT AND PLAN:   1. Acute myocardial infarction with a troponin greater than 21. Dr. Ubaldo Glassing cardiology seen and will decide if any intervention need. continue heparin drip. & Plavix for now. Patient has an allergy to NSAIDs. No beta blocker secondary to bronchospasm. continue statin and check lipid profile.  2. Nausea, vomiting, diarrhea and abdominal pain- has no stool for C. difficile since admission. Patient is not a surgical candidate for her abdominal ventral hernia. 3. Hypokalemia and hypomagnesemia- replete and recheck 4. Chronic pain syndrome: continue IV and oral pain medications. 5. 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 She may have baseline wheezing. hold off on antibiotics. On Solu-Medrol. Continue nebulizer treatments. 6. Peripheral vascular disease with ulcer of the left lower extremity- patient is on Plavix. Ulcer is healing. 7 tobacco abuse smoking cessation counseling done 3 minutes by me. Nicotine patch applied. 8. Likely basal cell cancers over the face. These are too big for a dermatologist to remove. Not a candidate for plastic surgery operation secondary to multiple medical issues.   All the records are reviewed and case discussed with Care Management/Social Workerr. Management plans discussed with the patient, family and they are in agreement.  CODE STATUS: full code  TOTAL TIME TAKING CARE OF THIS PATIENT: 35 minutes.   More than 50% of the time was spent in counseling/coordination of care: YES  POSSIBLE D/C IN 1-2 DAYS, DEPENDING ON CLINICAL CONDITION.   [facility], Dary Dilauro M.D on 01/14/2015 at 8:43 AM  Between 7am to 6pm - Pager - [phone]  After 6pm go to [email or web address] - password [email or web address] [place] [facility]  Office  [phone]  CC:  Primary care physician; Pcp Not In System

## 2015-01-14 NOTE — Progress Notes (Signed)
Currently on enteric precautions pending C diff testing and stool cultures.  If these are negative, pt should be placed on Contact precautions due to recent (12/29/14) MRSA pcr.  She does not need to be rescreened as this positive result was less than 30 days ago.    Winnifred Friar, RN, MSN, CIC 01/14/15 [id number]

## 2015-01-15 ENCOUNTER — Inpatient Hospital Stay: Payer: Medicare Other

## 2015-01-15 LAB — CBC
HEMATOCRIT: 31.6 % — AB (ref 35.0–47.0)
HEMOGLOBIN: 9.5 g/dL — AB (ref 12.0–16.0)
MCH: 22.4 pg — AB (ref 26.0–34.0)
MCHC: 30.2 g/dL — ABNORMAL LOW (ref 32.0–36.0)
MCV: 74.2 fL — AB (ref 80.0–100.0)
Platelets: 282 10*3/uL (ref 150–440)
RBC: 4.25 MIL/uL (ref 3.80–5.20)
RDW: 19.4 % — ABNORMAL HIGH (ref 11.5–14.5)
WBC: 9.7 10*3/uL (ref 3.6–11.0)

## 2015-01-15 LAB — IRON AND TIBC
IRON: 10 ug/dL — AB (ref 28–170)
Saturation Ratios: 3 % — ABNORMAL LOW (ref 10.4–31.8)
TIBC: 291 ug/dL (ref 250–450)
UIBC: 282 ug/dL

## 2015-01-15 LAB — FERRITIN: Ferritin: 37 ng/mL (ref 11–307)

## 2015-01-15 NOTE — Progress Notes (Addendum)
[place] at [place] NAME: Lynn Butler    MR#:  [id number]   DATE OF BIRTH:  05/12/1946  SUBJECTIVE:  CHIEF COMPLAINT:   Chief Complaint  Patient presents with  . Chest Pain   could not sleep very well, having heartburn and back pain, just not feeling great and request if she can stay longer, has wheezing and also feels somewhat short of breath.  Overall happy that cardiac catheter was done and she knows why she was having chest pain and heart attack  REVIEW OF SYSTEMS:  Review of Systems  Constitutional: Negative for fever, weight loss, malaise/fatigue and diaphoresis.  HENT: Negative for ear discharge, ear pain, hearing loss, nosebleeds, sore throat and tinnitus.   Eyes: Negative for blurred vision and pain.  Respiratory: Positive for shortness of breath and wheezing. Negative for cough and hemoptysis.   Cardiovascular: Positive for chest pain. Negative for palpitations (chest pressure), orthopnea and leg swelling.  Gastrointestinal: Positive for heartburn, nausea and abdominal pain. Negative for vomiting, diarrhea, constipation and blood in stool.  Genitourinary: Negative for dysuria, urgency and frequency.  Musculoskeletal: Positive for back pain. Negative for myalgias.  Skin: Negative for itching and rash.  Neurological: Negative for dizziness, tingling, tremors, focal weakness, seizures, weakness and headaches.  Psychiatric/Behavioral: Negative for depression. The patient is not nervous/anxious.    DRUG ALLERGIES:   Allergies  Allergen Reactions  . Nsaids Nausea And Vomiting and Other (See Comments)    Reaction:  GI distress and burning    VITALS:  Blood pressure 113/51, pulse 80, temperature 97.5 F (36.4 C), temperature source Oral, resp. rate 18, height 5' 4.5" (1.638 m), weight 44.135 kg (97 lb 4.8 oz), SpO2 98 %. PHYSICAL EXAMINATION:  Physical Exam  Constitutional: She is oriented to person, place, and time. She  appears malnourished. She appears unhealthy. She appears cachectic.  HENT:  Head: Normocephalic and atraumatic.  Eyes: Conjunctivae and EOM are normal. Pupils are equal, round, and reactive to light.  Neck: Normal range of motion. Neck supple. No tracheal deviation present. No thyromegaly present.  Cardiovascular: Normal rate, regular rhythm and normal heart sounds.   Pulmonary/Chest: Effort normal and breath sounds normal. No respiratory distress. She has no wheezes. She exhibits no tenderness.  Abdominal: Soft. Bowel sounds are normal. She exhibits no distension. There is no tenderness.  Musculoskeletal: Normal range of motion.  Neurological: She is alert and oriented to person, place, and time. No cranial nerve deficit.  Skin: Skin is warm and dry. No rash noted.     Large cancerous looking lesion over the left forehand, cancerous lesion left nose and right cheek and right forehead. Ulcer left lower extremity  Psychiatric: Mood and affect normal.   LABORATORY PANEL:   CBC  Recent Labs Lab 01/15/15 0619  WBC 9.7  HGB 9.5*  HCT 31.6*  PLT 282   ------------------------------------------------------------------------------------------------------------------ Chemistries   Recent Labs Lab 01/13/15 1830 01/14/15 0738  NA 135 138  K 2.6* 5.2*  CL 97* 103  CO2 29 28  GLUCOSE 109* 130*  BUN 7 12  CREATININE 0.73 0.86  CALCIUM 8.1* 8.3*  MG 1.7  --   AST 30  --   ALT 21  --   ALKPHOS 92  --   BILITOT 0.5  --    RADIOLOGY:  No results found. ASSESSMENT AND PLAN:   1. Acute myocardial infarction with a troponin greater than 21. Appreciate Dr. Ubaldo Glassing & Dr Tyrell Antonio input -  Cardiac catheterization revealed patent rca stent with occluded distal rca stent. Diffusely diseased lcx. Medical management recommended.  Continue Lipitor & Amlodipine.  Unable to use beta blocker due to severe COPD and bronchospasm.  2. Nausea, vomiting, diarrhea and abdominal pain- has no stool for C.  difficile since admission. Patient is not a surgical candidate for her abdominal ventral hernia.  3. Hyperkalemia in a patient who was admitted with hypokalemia: Stopped supplementation.  We will recheck.  Likely iatrogenic  4.  Anemia: Check stool for Hemoccult, May need to hold Plavix if her hemoglobin continues to drop.  Consider GI consult.  Myoglobin dropped from 11.3->9.5.  Check iron studies  5. 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 She may have baseline wheezing. hold off on antibiotics. On Solu-Medrol. Continue nebulizer treatments.  6. Peripheral vascular disease with ulcer of the left lower extremity- patient is on Plavix. Ulcer is healing.  May need to hold Plavix if her hemoglobin continues to drop  7 tobacco abuse smoking cessation counseling done 3 minutes by me. Nicotine patch applied.  8. Likely basal cell cancers over the face. These are too big for a dermatologist to remove. Not a candidate for plastic surgery operation secondary to multiple medical issues.   All the records are reviewed and case discussed with Care Management/Social Worker. Management plans discussed with the patient, and she is in agreement.  CODE STATUS: full code  TOTAL TIME TAKING CARE OF THIS PATIENT: 35 minutes.   More than 50% of the time was spent in counseling/coordination of care: YES  POSSIBLE D/C IN 1-2 DAYS, DEPENDING ON CLINICAL CONDITION.   [facility], Thecla Forgione M.D on 01/15/2015 at 8:00 AM  Between 7am to 6pm - Pager - [phone]  After 6pm go to [email or web address] - password [email or web address] [facility]  Office  [phone]  CC:  Primary care physician; Pcp Not In System

## 2015-01-15 NOTE — Progress Notes (Signed)
[facility]  SUBJECTIVE: Patient status post left cardiac catheterization yesterday. Had moderate disease felt to be a candidate for medical therapy due to heavily calcified diffuse disease in her circumflex. Earlier this morning had no complaints from the catheterization. Mildly short of breath with bronchospasm. On the way to chest x-ray developed severe dyspnea. On return to enroll him and placement on 100% face mask had pulse oximetry of 100%. She continues to have bronchospasm. Lung exam revealed bilateral inspiratory and expiratory wheezing. We'll change excess way to portable and continue aggressive pulmonary toilet. Follow for evidence of pulmonary edema.   Filed Vitals:   01/14/15 1715 01/14/15 1810 01/14/15 1926 01/15/15 0441  BP: 130/73 114/67 121/61 113/51  Pulse: 103 97 94 80  Temp:   97.9 F (36.6 C) 97.5 F (36.4 C)  TempSrc:   Oral   Resp:   18 18  Height:      Weight:    44.135 kg (97 lb 4.8 oz)  SpO2: 98% 100% 100% 98%    Intake/Output Summary (Last 24 hours) at 01/15/15 0910 Last data filed at 01/15/15 0747  Gross per 24 hour  Intake   1494 ml  Output   1250 ml  Net    244 ml    LABS: Basic Metabolic Panel:  Recent Labs  01/13/15 1830 01/14/15 0738  NA 135 138  K 2.6* 5.2*  CL 97* 103  CO2 29 28  GLUCOSE 109* 130*  BUN 7 12  CREATININE 0.73 0.86  CALCIUM 8.1* 8.3*  MG 1.7  --    Liver Function Tests:  Recent Labs  01/13/15 1830  AST 30  ALT 21  ALKPHOS 92  BILITOT 0.5  PROT 6.8  ALBUMIN 3.1*    Recent Labs  01/13/15 1830  LIPASE 49   CBC:  Recent Labs  01/13/15 1830 01/14/15 0738 01/15/15 0619  WBC 12.9* 5.3 9.7  NEUTROABS 9.0*  --   --   HGB 12.7 11.3* 9.5*  HCT 41.4 36.0 31.6*  MCV 72.2* 73.3* 74.2*  PLT 316 292 282   Cardiac Enzymes:  Recent Labs  01/13/15 2157 01/14/15 0208 01/14/15 0738  TROPONINI 24.49* 13.50* 9.61*   BNP: Invalid input(s): POCBNP D-Dimer: No results  for input(s): DDIMER in the last 72 hours. Hemoglobin A1C: No results for input(s): HGBA1C in the last 72 hours. Fasting Lipid Panel:  Recent Labs  01/14/15 0738  CHOL 176  HDL 58  LDLCALC 104*  TRIG 72  CHOLHDL 3.0   Thyroid Function Tests: No results for input(s): TSH, T4TOTAL, T3FREE, THYROIDAB in the last 72 hours.  Invalid input(s): FREET3 Anemia Panel:  Recent Labs  01/15/15 0619  FERRITIN 37  TIBC 291  IRON 10*     Physical Exam: Blood pressure 113/51, pulse 80, temperature 97.5 F (36.4 C), temperature source Oral, resp. rate 18, height 5' 4.5" (1.638 m), weight 44.135 kg (97 lb 4.8 oz), SpO2 98 %.   General appearance: cooperative Neck: no adenopathy, no carotid bruit, no JVD, supple, symmetrical, trachea midline and thyroid not enlarged, symmetric, no tenderness/mass/nodules Resp: diminished breath sounds bilaterally, dullness to percussion bilaterally, rhonchi bilaterally and wheezes bilaterally Cardio: regular rate and rhythm GI: soft, non-tender; bowel sounds normal; no masses,  no organomegaly Extremities: Catheter site on left femoral region clean and dry. Distal pulses nonpalpable. No change from precath. No hematoma or bruit. Pulses: Right Pulses: FEM: present 2+, POP: present 1+, DP: absent, PT: absent Left Pulses: FEM: present  2+, POP: present 1+, DP: absent, PT: absent Skin: Skin color, texture, turgor normal. No rashes or lesions or ecchymoses - face, torso and Large lesion over her left eye. This is been present previously. Healing ulcers on her sacrum and heels Neurologic: Grossly normal  TELEMETRY: Reviewed telemetry pt in sinus rhythm sinus tachycardia:  ASSESSMENT AND PLAN:  Principal Problem:   Acute MI-status post cardiac catheterization. Had severely diseased left circumflex not amenable to PCI. Stent in RCA proximally was patent with a distal RCA stent occluded. Currently stable. Medical management. Active Problems:   Acute on chronic  respiratory failure with hypoxemia-respiratory distress this morning. We'll treat aggressively with pulmonary dilators and pursue a chest x-ray to determine if there is any pulmonary edema. We'll diuresis this case. Pulse ox on face mask of 97-100%   COPD mixed type-as per above   CAD (coronary artery disease)   CHF (congestive heart failure)-will follow for cardiomyopathy and for evidence of heart failure. Diuresis as needed.   PVD (peripheral vascular disease)-continue to have intermittent symptoms. Currently stable hemodynamically. Smoking cessation is recommended   Protein-calorie malnutrition, severe-dietary recommendations would be helpful    Teodoro Spray., MD, [facility] 01/15/2015 9:10 AM

## 2015-01-15 NOTE — Progress Notes (Signed)
Hospitalist paged, pt requesting nicotine patch. MD to put in orders. Conley Simmonds, RN

## 2015-01-16 LAB — BASIC METABOLIC PANEL
ANION GAP: 6 (ref 5–15)
BUN: 27 mg/dL — AB (ref 6–20)
CALCIUM: 8.4 mg/dL — AB (ref 8.9–10.3)
CHLORIDE: 102 mmol/L (ref 101–111)
CO2: 30 mmol/L (ref 22–32)
CREATININE: 0.79 mg/dL (ref 0.44–1.00)
GLUCOSE: 130 mg/dL — AB (ref 65–99)
Potassium: 5.1 mmol/L (ref 3.5–5.1)
SODIUM: 138 mmol/L (ref 135–145)

## 2015-01-16 LAB — CBC
HCT: 32.1 % — ABNORMAL LOW (ref 35.0–47.0)
HEMOGLOBIN: 9.9 g/dL — AB (ref 12.0–16.0)
MCH: 22.5 pg — AB (ref 26.0–34.0)
MCHC: 30.8 g/dL — ABNORMAL LOW (ref 32.0–36.0)
MCV: 73 fL — AB (ref 80.0–100.0)
Platelets: 298 10*3/uL (ref 150–440)
RBC: 4.39 MIL/uL (ref 3.80–5.20)
RDW: 19.3 % — ABNORMAL HIGH (ref 11.5–14.5)
WBC: 11.8 10*3/uL — ABNORMAL HIGH (ref 3.6–11.0)

## 2015-01-16 LAB — OCCULT BLOOD X 1 CARD TO LAB, STOOL: Fecal Occult Bld: NEGATIVE

## 2015-01-16 MED ORDER — BENZONATATE 100 MG PO CAPS
100.0000 mg | ORAL_CAPSULE | Freq: Three times a day (TID) | ORAL | Status: DC
  Administered 2015-01-16 – 2015-01-17 (×3): 100 mg via ORAL
  Filled 2015-01-16 (×3): qty 1

## 2015-01-16 MED ORDER — METHYLPREDNISOLONE SODIUM SUCC 40 MG IJ SOLR
40.0000 mg | Freq: Two times a day (BID) | INTRAMUSCULAR | Status: DC
  Administered 2015-01-16 – 2015-01-17 (×2): 40 mg via INTRAVENOUS
  Filled 2015-01-16 (×2): qty 1

## 2015-01-16 MED ORDER — LORAZEPAM 1 MG PO TABS
1.0000 mg | ORAL_TABLET | Freq: Four times a day (QID) | ORAL | Status: DC | PRN
  Administered 2015-01-16 (×2): 1 mg via ORAL
  Filled 2015-01-16 (×2): qty 1

## 2015-01-16 MED ORDER — HYDROCOD POLST-CPM POLST ER 10-8 MG/5ML PO SUER
10.0000 mL | Freq: Two times a day (BID) | ORAL | Status: DC
  Administered 2015-01-16 – 2015-01-17 (×3): 10 mL via ORAL
  Filled 2015-01-16 (×3): qty 10

## 2015-01-16 MED ORDER — CARVEDILOL 3.125 MG PO TABS
3.1250 mg | ORAL_TABLET | Freq: Two times a day (BID) | ORAL | Status: DC
  Administered 2015-01-16 – 2015-01-17 (×2): 3.125 mg via ORAL
  Filled 2015-01-16 (×3): qty 1

## 2015-01-16 NOTE — Progress Notes (Signed)
[facility]  SUBJECTIVE: Resting comfortably in bed drinking coffee this am. She states she had a "severe breathing problem this morning" Appears resolved at present. Hesitant to go home. Had a similar event that i witnessed yesterday am with bronchospasm but good oxygen sats. No evidence of chf exacerbation or ischemia this am. CXR yesterday without effusion or edema.   Filed Vitals:   01/15/15 1146 01/15/15 1514 01/15/15 1925 01/16/15 0450  BP: 134/57 115/60 158/78 158/76  Pulse: 95 86 90 87  Temp: 98.2 F (36.8 C) 98.3 F (36.8 C) 98 F (36.7 C) 98.2 F (36.8 C)  TempSrc: Oral Oral  Oral  Resp: 19 14 20 20   Height:      Weight:    44.861 kg (98 lb 14.4 oz)  SpO2: 100% 99% 100% 94%    Intake/Output Summary (Last 24 hours) at 01/16/15 1048 Last data filed at 01/16/15 0900  Gross per 24 hour  Intake      0 ml  Output   2700 ml  Net  -2700 ml    LABS: Basic Metabolic Panel:  Recent Labs  01/13/15 1830 01/14/15 0738 01/16/15 0438  NA 135 138 138  K 2.6* 5.2* 5.1  CL 97* 103 102  CO2 29 28 30   GLUCOSE 109* 130* 130*  BUN 7 12 27*  CREATININE 0.73 0.86 0.79  CALCIUM 8.1* 8.3* 8.4*  MG 1.7  --   --    Liver Function Tests:  Recent Labs  01/13/15 1830  AST 30  ALT 21  ALKPHOS 92  BILITOT 0.5  PROT 6.8  ALBUMIN 3.1*    Recent Labs  01/13/15 1830  LIPASE 49   CBC:  Recent Labs  01/13/15 1830  01/15/15 0619 01/16/15 0438  WBC 12.9*  < > 9.7 11.8*  NEUTROABS 9.0*  --   --   --   HGB 12.7  < > 9.5* 9.9*  HCT 41.4  < > 31.6* 32.1*  MCV 72.2*  < > 74.2* 73.0*  PLT 316  < > 282 298  < > = values in this interval not displayed. Cardiac Enzymes:  Recent Labs  01/13/15 2157 01/14/15 0208 01/14/15 0738  TROPONINI 24.49* 13.50* 9.61*   BNP: Invalid input(s): POCBNP D-Dimer: No results for input(s): DDIMER in the last 72 hours. Hemoglobin A1C: No results for input(s): HGBA1C in the last 72 hours. Fasting  Lipid Panel:  Recent Labs  01/14/15 0738  CHOL 176  HDL 58  LDLCALC 104*  TRIG 72  CHOLHDL 3.0   Thyroid Function Tests: No results for input(s): TSH, T4TOTAL, T3FREE, THYROIDAB in the last 72 hours.  Invalid input(s): FREET3 Anemia Panel:  Recent Labs  01/15/15 0619  FERRITIN 37  TIBC 291  IRON 10*     Physical Exam: Blood pressure 158/76, pulse 87, temperature 98.2 F (36.8 C), temperature source Oral, resp. rate 20, height 5' 4.5" (1.638 m), weight 44.861 kg (98 lb 14.4 oz), SpO2 94 %.  General appearance: alert and cooperative Resp: rhonchi bibasilar Cardio: regular rate and rhythm, S1, S2 normal, no murmur, click, rub or gallop Pulses: 2+ and symmetric Neurologic: Grossly normal  TELEMETRY: Reviewed telemetry pt in sr with no ischemia  ASSESSMENT AND PLAN:  Principal Problem:   Acute MI-clinically stable on current meds. Continue with medical management including asa and plavix.  Active Problems:   Acute on chronic respiratory failure with hypoxemia   COPD mixed type   CAD (coronary artery disease)  CHF (congestive heart failure)   PVD (peripheral vascular disease)   Protein-calorie malnutrition, severe    Teodoro Spray., MD, [facility] 01/16/2015 10:48 AM

## 2015-01-16 NOTE — Progress Notes (Signed)
Pt resting quietly at this time with eyes closed, in NAD, skin cool and dry, respirations even and unlabored.  Medicated multiple times today for pain and anxiety.  Pt had 2 episodes of 36-40 beats of V-tach with pacer resolve, no shock.  Both episodes A-symptomatic.  Dr. Ubaldo Glassing aware, coreg added to medication regimen.  VSS, SR per monitor.

## 2015-01-16 NOTE — Progress Notes (Signed)
Pt with 36 beats V tach and 10 beats paced resolved.  Pt sitting up eating, denies any s/s.  Dr. Ubaldo Glassing made aware, new order for coreg 3.125 mg PO BID received.

## 2015-01-16 NOTE — Progress Notes (Signed)
[place] [facility] [place] NAME: Lynn Butler    MR#:  [id number]   DATE OF BIRTH:  1945-09-17  SUBJECTIVE:  CHIEF COMPLAINT:   Chief Complaint  Patient presents with  . Chest Pain   - Breathing seems to be much better today,. Appears very happy alert and not in any distress. -Does indicate that she has occasional anxiety episodes after which she develops bronchospasm and has trouble breathing. -Admitted for acute MI, cardiac catheterization revealing chronic disease with calcified vessels and medical intervention recommended.  REVIEW OF SYSTEMS:  Review of Systems  Constitutional: Negative for fever, weight loss, malaise/fatigue and diaphoresis.  HENT: Negative for ear discharge, ear pain, hearing loss, nosebleeds, sore throat and tinnitus.   Eyes: Negative for blurred vision and pain.  Respiratory: Positive for shortness of breath and wheezing. Negative for cough and hemoptysis.   Cardiovascular: Negative for chest pain, palpitations (chest pressure), orthopnea and leg swelling.  Gastrointestinal: Positive for heartburn and abdominal pain. Negative for nausea, vomiting, diarrhea, constipation and blood in stool.       Does have chronic abdominal pain.  Genitourinary: Negative for dysuria, urgency and frequency.  Musculoskeletal: Positive for back pain. Negative for myalgias.  Skin: Negative for itching and rash.  Neurological: Negative for dizziness, tingling, tremors, focal weakness, seizures, weakness and headaches.  Psychiatric/Behavioral: Negative for depression. The patient is not nervous/anxious.    DRUG ALLERGIES:   Allergies  Allergen Reactions  . Nsaids Nausea And Vomiting and Other (See Comments)    Reaction:  GI distress and burning    VITALS:  Blood pressure 158/76, pulse 87, temperature 98.2 F (36.8 C), temperature source Oral, resp. rate 20, height 5' 4.5" (1.638 m), weight 44.861 kg (98 lb 14.4 oz), SpO2 94  %. PHYSICAL EXAMINATION:  Physical Exam  Constitutional: She is oriented to person, place, and time. She appears malnourished. She appears unhealthy. She appears cachectic.  HENT:  Head: Normocephalic and atraumatic.  Eyes: Conjunctivae and EOM are normal. Pupils are equal, round, and reactive to light.  Neck: Normal range of motion. Neck supple. No tracheal deviation present. No thyromegaly present.  Cardiovascular: Normal rate, regular rhythm and normal heart sounds.   Pulmonary/Chest: Effort normal and breath sounds normal. No respiratory distress. She has no wheezes. She exhibits no tenderness.  Occasional scattered wheezes.  Abdominal: Soft. Bowel sounds are normal. She exhibits no distension. There is no tenderness.  Musculoskeletal: Normal range of motion.  Neurological: She is alert and oriented to person, place, and time. No cranial nerve deficit.  Skin: Skin is warm and dry. No rash noted.     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   Psychiatric: Mood and affect normal.   LABORATORY PANEL:   CBC  Recent Labs Lab 01/16/15 0438  WBC 11.8*  HGB 9.9*  HCT 32.1*  PLT 298   ------------------------------------------------------------------------------------------------------------------ Chemistries   Recent Labs Lab 01/13/15 1830  01/16/15 0438  NA 135  < > 138  K 2.6*  < > 5.1  CL 97*  < > 102  CO2 29  < > 30  GLUCOSE 109*  < > 130*  BUN 7  < > 27*  CREATININE 0.73  < > 0.79  CALCIUM 8.1*  < > 8.4*  MG 1.7  --   --   AST 30  --   --   ALT 21  --   --   ALKPHOS 92  --   --  BILITOT 0.5  --   --   < > = values in this interval not displayed. RADIOLOGY:  No results found. ASSESSMENT AND PLAN:   1. Acute myocardial infarction with a troponin greater than 21. Appreciate Dr. Ubaldo Glassing & Dr Tyrell Antonio input - Cardiac catheterization revealed patent rca stent with occluded distal rca stent.  -Diffusely diseased lcx.  -Medical management recommended.    -Continue Lipitor & Amlodipine.   -Unable to use beta blocker due to severe COPD and bronchospasm. -Sublingual nitroglycerin for when necessary chest pain. Strongly counseled against smoking again.  2. Nausea, vomiting, diarrhea and abdominal pain- has no stool for C. difficile since admission. Recent couple of admissions for abdominal surgeries for bowel obstruction. Does have abdominal ventral hernia but not a surgical candidate at this time. Past history of C. difficile presents to his on isolation. Tolerating diet well.  3. Hyperkalemia-iatrogenic hyperkalemia from oversupplementation. Normalized now. Monitor.  4.  Anemia: Hemoglobin low but stable. Continue to monitor. Recent history of abdominal surgeries twice. 5. 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 She has some baseline wheezing. hold off on antibiotics.  -On Solu-Medrol. Continue nebulizer treatments and inhalers. -Added cough medicine. -Most of her respiratory episodes are triggered by anxiety. Increase her Ativan frequency.  6. Peripheral vascular disease with ulcer of the left lower extremity- patient is on Plavix. Ulcer is healing.  May need to hold Plavix if her hemoglobin continues to drop. Follows with [facility] wound clinic.  7 tobacco abuse smoking cessation counseling done 3 minutes by me. Nicotine patch applied.  8. Ulcerative lesion on left forehead. No recent increase in size. Following with [facility] dermatology. Continue to monitor. Or can follow up with the plastic surgeon. No inpatient needs.  9. Chronic pain issues-follows with pain management physician at [facility]. Was on a pain contract and we are not supposed to refill any of her prescriptions. However patient today says that she has ended her contract over there and she is free to follow-up with any pain management physician.  -Set up an outpatient pain clinic appointment.  All the records are  reviewed and case discussed with Care Management/Social Worker. Management plans discussed with the patient, and she is in agreement.  CODE STATUS: full code  TOTAL TIME TAKING CARE OF THIS PATIENT: 35 minutes.   More than 50% of the time was spent in counseling/coordination of care: YES  POSSIBLE D/C TOMORROW, DEPENDING ON CLINICAL CONDITION.   Gladstone Lighter M.D on 01/16/2015 at 12:20 PM  Between 7am to 6pm - Pager - [phone]  After 6pm go to [email or web address] - password [email or web address] [facility]  Office  [phone]  CC:  Primary care physician; Pcp Not In System

## 2015-01-17 MED ORDER — HYDROCOD POLST-CPM POLST ER 10-8 MG/5ML PO SUER: 10.0000 mL | Freq: Two times a day (BID) | ORAL | Status: DC

## 2015-01-17 MED ORDER — LORAZEPAM 1 MG PO TABS: 1.0000 mg | ORAL_TABLET | Freq: Three times a day (TID) | ORAL | Status: DC | PRN

## 2015-01-17 MED ORDER — DIAZEPAM 10 MG PO TABS: 10.0000 mg | ORAL_TABLET | Freq: Two times a day (BID) | ORAL | Status: DC | PRN

## 2015-01-17 MED ORDER — ALBUTEROL SULFATE (2.5 MG/3ML) 0.083% IN NEBU: 2.5000 mg | INHALATION_SOLUTION | Freq: Four times a day (QID) | RESPIRATORY_TRACT | Status: DC | PRN

## 2015-01-17 MED ORDER — CARVEDILOL 3.125 MG PO TABS: 3.1250 mg | ORAL_TABLET | Freq: Two times a day (BID) | ORAL | Status: AC

## 2015-01-17 MED ORDER — PREDNISONE 10 MG (21) PO TBPK: 10.0000 mg | ORAL_TABLET | Freq: Every day | ORAL | Status: DC

## 2015-01-17 MED ORDER — ZOLPIDEM TARTRATE 10 MG PO TABS: 10.0000 mg | ORAL_TABLET | Freq: Every evening | ORAL | Status: DC | PRN

## 2015-01-17 MED ORDER — HYDROMORPHONE HCL 4 MG PO TABS: 4.0000 mg | ORAL_TABLET | Freq: Four times a day (QID) | ORAL | Status: DC | PRN

## 2015-01-17 MED ORDER — NICOTINE 21 MG/24HR TD PT24: 21.0000 mg | MEDICATED_PATCH | Freq: Every day | TRANSDERMAL | Status: DC

## 2015-01-17 MED ORDER — ATORVASTATIN CALCIUM 40 MG PO TABS: 40.0000 mg | ORAL_TABLET | Freq: Every day | ORAL | Status: AC

## 2015-01-17 MED ORDER — BENZONATATE 100 MG PO CAPS: 100.0000 mg | ORAL_CAPSULE | Freq: Three times a day (TID) | ORAL | Status: DC

## 2015-01-17 NOTE — Progress Notes (Signed)
Pt discharged to home, IV and tele removed. Prescriptions given to patient. Education given to patient. Patient educated on follow up appointments. Leaving floor via wheelchair with volunteer and daughter.

## 2015-01-17 NOTE — Care Management (Signed)
Patient for discharge today with resumption of care orders [facility] through [place].  Information faxed

## 2015-01-17 NOTE — Progress Notes (Signed)
[place] at [place] NAME: Lynn Butler    MR#:  [id number]   DATE OF BIRTH:  1946-01-07  SUBJECTIVE:  CHIEF COMPLAINT:   Chief Complaint  Patient presents with  . Chest Pain   - continues to have anxiety episodes and some dry cough following which she has dyepnic episodes- but it is chronic -Admitted for acute MI, cardiac catheterization revealing chronic disease with calcified vessels and medical intervention recommended.  REVIEW OF SYSTEMS:  Review of Systems  Constitutional: Negative for fever, weight loss, malaise/fatigue and diaphoresis.  HENT: Negative for ear discharge, ear pain, hearing loss, nosebleeds, sore throat and tinnitus.   Eyes: Negative for blurred vision and pain.  Respiratory: Positive for shortness of breath and wheezing. Negative for cough and hemoptysis.   Cardiovascular: Negative for chest pain, palpitations (chest pressure), orthopnea and leg swelling.  Gastrointestinal: Positive for heartburn and abdominal pain. Negative for nausea, vomiting, diarrhea, constipation and blood in stool.       Does have chronic abdominal pain.  Genitourinary: Negative for dysuria, urgency and frequency.  Musculoskeletal: Positive for back pain. Negative for myalgias.  Skin: Negative for itching and rash.  Neurological: Negative for dizziness, tingling, tremors, focal weakness, seizures, weakness and headaches.  Psychiatric/Behavioral: Negative for depression. The patient is nervous/anxious.    DRUG ALLERGIES:   Allergies  Allergen Reactions  . Nsaids Nausea And Vomiting and Other (See Comments)    Reaction:  GI distress and burning    VITALS:  Blood pressure 147/71, pulse 96, temperature 98.3 F (36.8 C), temperature source Oral, resp. rate 19, height 5' 4.5" (1.638 m), weight 44.861 kg (98 lb 14.4 oz), SpO2 98 %. PHYSICAL EXAMINATION:  Physical Exam  Constitutional: She is oriented to person, place, and time. She  appears malnourished. She appears unhealthy. She appears cachectic.  HENT:  Head: Normocephalic and atraumatic.  Eyes: Conjunctivae and EOM are normal. Pupils are equal, round, and reactive to light.  Neck: Normal range of motion. Neck supple. No tracheal deviation present. No thyromegaly present.  Cardiovascular: Normal rate, regular rhythm and normal heart sounds.   Pulmonary/Chest: Effort normal and breath sounds normal. No respiratory distress. She has no wheezes. She exhibits no tenderness.  Occasional scattered wheezes.  Abdominal: Soft. Bowel sounds are normal. She exhibits no distension. There is no tenderness.  Musculoskeletal: Normal range of motion.  Neurological: She is alert and oriented to person, place, and time. No cranial nerve deficit.  Skin: Skin is warm and dry. No rash noted.     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   Psychiatric: Mood and affect normal.   LABORATORY PANEL:   CBC  Recent Labs Lab 01/16/15 0438  WBC 11.8*  HGB 9.9*  HCT 32.1*  PLT 298   ------------------------------------------------------------------------------------------------------------------ Chemistries   Recent Labs Lab 01/13/15 1830  01/16/15 0438  NA 135  < > 138  K 2.6*  < > 5.1  CL 97*  < > 102  CO2 29  < > 30  GLUCOSE 109*  < > 130*  BUN 7  < > 27*  CREATININE 0.73  < > 0.79  CALCIUM 8.1*  < > 8.4*  MG 1.7  --   --   AST 30  --   --   ALT 21  --   --   ALKPHOS 92  --   --   BILITOT 0.5  --   --   < > =  values in this interval not displayed. RADIOLOGY:  No results found. ASSESSMENT AND PLAN:   1. Acute myocardial infarction with a troponin greater than 21. Appreciate Dr. Ubaldo Glassing & Dr Tyrell Antonio input - Cardiac catheterization revealed patent rca stent with occluded distal rca stent.  -Diffusely diseased lcx.  -Medical management recommended.   -Continue Lipitor & Amlodipine.   -Unable to use beta blocker due to severe COPD and  bronchospasm. -Sublingual nitroglycerin for when necessary chest pain. Strongly counseled against smoking again. - cardiac rehab referral ordered  2. Nausea, vomiting, diarrhea and abdominal pain- has no stool for C. difficile since admission. Recent couple of admissions for abdominal surgeries for bowel obstruction. Does have abdominal ventral hernia but not a surgical candidate at this time. Past history of C. difficile presents to his on isolation. Tolerating diet well.  3. Hyperkalemia-iatrogenic hyperkalemia from oversupplementation. Normalized now. Monitor.  4.  Anemia: Hemoglobin low but stable. Continue to monitor. Recent history of abdominal surgeries twice.   5. 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 She has some baseline wheezing. hold off on antibiotics.  -On Solu-Medrol. Continue nebulizer treatments and inhalers. -Added cough medicine. -Most of her respiratory episodes are triggered by anxiety. Increase her Ativan frequency.  6. Peripheral vascular disease with ulcer of the left lower extremity- patient is on Plavix. Ulcer is healing.  May need to hold Plavix if her hemoglobin continues to drop. Follows with [facility] wound clinic.  7 tobacco abuse smoking cessation counseling done 3 minutes. Nicotine patch applied. Discharge on the patch  8. Ulcerative lesion on left forehead. No recent increase in size. Following with [facility] dermatology. Continue to monitor. Or can follow up with the plastic surgeon. No inpatient needs.  9. Chronic pain issues-follows with pain management physician at [facility]. Was on a pain contract and we are not supposed to refill any of her prescriptions. However patient says that she has ended her contract over there and she is free to follow-up with any pain management physician. Wants to set up an appointment with a local pain physician. Begging to get pain meds for atleast 2-3 days -Set up  an outpatient pain clinic appointment.  All the records are reviewed and case discussed with Care Management/Social Worker. Management plans discussed with the patient, and she is in agreement.  CODE STATUS: full code  TOTAL TIME TAKING CARE OF THIS PATIENT: 38 minutes.   More than 50% of the time was spent in counseling/coordination of care: YES  POSSIBLE D/C TODAY, DEPENDING ON CLINICAL CONDITION.   Tiesha Marich M.D on 01/17/2015 at 11:53 AM  Between 7am to 6pm - Pager - [phone]  After 6pm go to [email or web address] - password [email or web address] [facility]  Office  [phone]  CC:  Primary care physician; Pcp Not In System

## 2015-01-17 NOTE — Discharge Instructions (Signed)
°  DIET:  °Cardiac diet  ° °DISCHARGE CONDITION:  °Fair ° °ACTIVITY:  °Activity as tolerated ° °OXYGEN:  °Home Oxygen: Yes.   °  °Oxygen Delivery: 2 liters/min via Patient connected to nasal cannula oxygen ° °DISCHARGE LOCATION:  °home  ° °If you experience worsening of your admission symptoms, develop shortness of breath, life threatening emergency, suicidal or homicidal thoughts you must seek medical attention immediately by calling 911 or calling your MD immediately  if symptoms less severe. ° °You Must read complete instructions/literature along with all the possible adverse reactions/side effects for all the Medicines you take and that have been prescribed to you. Take any new Medicines after you have completely understood and accpet all the possible adverse reactions/side effects.  ° °Please note ° °You were cared for by a hospitalist during your hospital stay. If you have any questions about your discharge medications or the care you received while you were in the hospital after you are discharged, you can call the unit and asked to speak with the hospitalist on call if the hospitalist that took care of you is not available. Once you are discharged, your primary care physician will handle any further medical issues. Please note that NO REFILLS for any discharge medications will be authorized once you are discharged, as it is imperative that you return to your primary care physician (or establish a relationship with a primary care physician if you do not have one) for your aftercare needs so that they can reassess your need for medications and monitor your lab values. ° ° °

## 2015-01-17 NOTE — Discharge Summary (Signed)
[place] [facility] [place] NAME: Lynn Butler    MR#:  [id number]  DATE OF BIRTH:  07-28-1946  DATE OF ADMISSION:  01/13/2015 ADMITTING PHYSICIAN: Loletha Grayer, MD  DATE OF DISCHARGE: 01/17/2015   PRIMARY CARE PHYSICIAN: [facility] [place]   ADMISSION DIAGNOSIS:  Non-ST elevation (NSTEMI) myocardial infarction [I21.4]  DISCHARGE DIAGNOSIS:  Principal Problem:   Acute MI Active Problems:   Acute on chronic respiratory failure with hypoxemia   COPD mixed type   CAD (coronary artery disease)   CHF (congestive heart failure)   PVD (peripheral vascular disease)   Protein-calorie malnutrition, severe   SECONDARY DIAGNOSIS:   Past Medical History  Diagnosis Date  . COPD (chronic obstructive pulmonary disease)   . Hypertension   . Ischemic cardiomyopathy     a.   . Coronary artery disease     a. s/p multiple stenting in 2005  . History of ventricular tachycardia   . PVD (peripheral vascular disease)   . PAD (peripheral artery disease)   . HLD (hyperlipidemia)   . Chronic back pain   . MI, old   . MRSA (methicillin resistant staph aureus) culture positive     left foot wound  . CHF (congestive heart failure)     HOSPITAL COURSE:  1. Acute myocardial infarction with a troponin greater than 21. Appreciate Dr. Ubaldo Glassing & Dr Tyrell Antonio input - Cardiac catheterization revealed patent rca stent with occluded distal rca stent.  -Diffusely diseased lcx.  -Medical management recommended.  -Continue Lipitor & Amlodipine.  -Unable to use beta blocker due to severe COPD and bronchospasm. -Sublingual nitroglycerin for when necessary chest pain. Strongly counseled against smoking again. - cardiac rehab referral ordered  2. Nausea, vomiting, diarrhea and abdominal pain- has no stool for C. difficile since admission. Recent couple of admissions for abdominal surgeries for bowel obstruction. Does have abdominal ventral hernia but not a  surgical candidate at this time. Past history of C. difficile presents to his on isolation. Tolerating diet well.  3. Hyperkalemia-iatrogenic hyperkalemia from oversupplementation. Normalized now. Monitor.  4. Anemia: Hemoglobin low but stable. Continue to monitor. Recent history of abdominal surgeries twice.   5. 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 She has some baseline wheezing. hold off on antibiotics.  -On Solu-Medrol. Continue nebulizer treatments and inhalers. -Added cough medicine. -Most of her respiratory episodes are triggered by anxiety. Increase her Ativan frequency.  6. Peripheral vascular disease with ulcer of the left lower extremity- patient is on Plavix. Ulcer is healing. May need to hold Plavix if her hemoglobin continues to drop. Follows with [facility] wound clinic.  7 tobacco abuse smoking cessation counseling done 3 minutes. Nicotine patch applied. Discharge on the patch  8. Ulcerative lesion on left forehead. No recent increase in size. Following with [facility] dermatology. Continue to monitor. Or can follow up with the plastic surgeon. No inpatient needs.  9. Chronic pain issues-follows with pain management physician at [facility]. Was on a pain contract and we are not supposed to refill any of her prescriptions. However patient says that she has ended her contract over there and she is free to follow-up with any pain management physician. Wants to set up an appointment with a local pain physician. Begging to get pain meds for atleast 2-3 days -Set up an outpatient pain clinic appointment.   DISCHARGE CONDITIONS:   Guarded  CONSULTS OBTAINED:  Treatment Team:  Delfino Lovett  Leslye Peer, MD Teodoro Spray, MD  DRUG ALLERGIES:   Allergies  Allergen Reactions  . Nsaids Nausea And Vomiting and Other (See Comments)    Reaction:  GI distress and burning     DISCHARGE MEDICATIONS:   Current Discharge  Medication List    START taking these medications   Details  atorvastatin (LIPITOR) 40 MG tablet Take 1 tablet (40 mg total) by mouth daily at 6 PM. Qty: 30 tablet, Refills: 0    benzonatate (TESSALON) 100 MG capsule Take 1 capsule (100 mg total) by mouth 3 (three) times daily. Qty: 30 capsule, Refills: 0    carvedilol (COREG) 3.125 MG tablet Take 1 tablet (3.125 mg total) by mouth 2 (two) times daily with a meal. Qty: 60 tablet, Refills: 2    chlorpheniramine-HYDROcodone (TUSSIONEX) 10-8 MG/5ML SUER Take 10 mLs by mouth every 12 (twelve) hours. Qty: 100 mL, Refills: 0    nicotine (NICODERM CQ - DOSED IN MG/24 HOURS) 21 mg/24hr patch Place 1 patch (21 mg total) onto the skin daily. Qty: 28 patch, Refills: 0    predniSONE (STERAPRED UNI-PAK 21 TAB) 10 MG (21) TBPK tablet Take 1 tablet (10 mg total) by mouth daily. 6 tabs PO x 1 day 5 tabs PO x 1 day 4 tabs PO x 1 day 3 tabs PO x 1 day 2 tabs PO x 1 day 1 tab PO x 1 day and stop Qty: 21 tablet, Refills: 0      CONTINUE these medications which have CHANGED   Details  albuterol (PROVENTIL) (2.5 MG/3ML) 0.083% nebulizer solution Take 3 mLs (2.5 mg total) by nebulization every 6 (six) hours as needed for wheezing or shortness of breath. Qty: 75 mL, Refills: 0    diazepam (VALIUM) 10 MG tablet Take 1 tablet (10 mg total) by mouth every 12 (twelve) hours as needed for anxiety. Qty: 20 tablet, Refills: 0    HYDROmorphone (DILAUDID) 4 MG tablet Take 1 tablet (4 mg total) by mouth every 6 (six) hours as needed for severe pain. Qty: 12 tablet, Refills: 0    LORazepam (ATIVAN) 1 MG tablet Take 1 tablet (1 mg total) by mouth every 8 (eight) hours as needed for anxiety. Qty: 12 tablet, Refills: 0    zolpidem (AMBIEN) 10 MG tablet Take 1 tablet (10 mg total) by mouth at bedtime as needed for sleep. Qty: 10 tablet, Refills: 0      CONTINUE these medications which have NOT CHANGED   Details  albuterol (PROAIR HFA) 108 (90 BASE) MCG/ACT  inhaler Inhale 1-2 puffs into the lungs every 4 (four) hours as needed for wheezing or shortness of breath.    amLODipine (NORVASC) 5 MG tablet Take 5 mg by mouth daily.    clopidogrel (PLAVIX) 75 MG tablet Take 75 mg by mouth daily.    Fluticasone Furoate-Vilanterol (BREO ELLIPTA) 100-25 MCG/INH AEPB Inhale 1 puff into the lungs daily.    furosemide (LASIX) 20 MG tablet Take 20 mg by mouth daily as needed for edema.    nitroGLYCERIN (NITROSTAT) 0.4 MG SL tablet Place 0.4 mg under the tongue every 5 (five) minutes as needed for chest pain.    OxyCODONE (OXYCONTIN) 80 mg T12A 12 hr tablet Take 1 tablet (80 mg total) by mouth every 12 (twelve) hours. Qty: 60 tablet, Refills: 0    traMADol (ULTRAM) 50 MG tablet Take 1 tablet (50 mg total) by mouth every 6 (six) hours as needed. Qty: 15 tablet, Refills: 0    Chlorhexidine  Gluconate Cloth 2 % PADS Apply 6 each topically daily at 6 (six) AM. Qty: 6 each, Refills: 0    collagenase (SANTYL) ointment Apply topically daily. Qty: 15 g, Refills: 0    gabapentin (NEURONTIN) 300 MG capsule 1 tab po at bedtime 1st day, 1 tablet bid second day, then 1 tablet tid Qty: 20 capsule, Refills: 0      STOP taking these medications     predniSONE (DELTASONE) 10 MG tablet          DISCHARGE INSTRUCTIONS:   1. PCP f/u in 1 week 2. Pain management clinic f/u in 1 week  If you experience worsening of your admission symptoms, develop shortness of breath, life threatening emergency, suicidal or homicidal thoughts you must seek medical attention immediately by calling 911 or calling your MD immediately  if symptoms less severe.  You Must read complete instructions/literature along with all the possible adverse reactions/side effects for all the Medicines you take and that have been prescribed to you. Take any new Medicines after you have completely understood and accept all the possible adverse reactions/side effects.   Please note  You were cared  for by a hospitalist during your hospital stay. If you have any questions about your discharge medications or the care you received while you were in the hospital after you are discharged, you can call the unit and asked to speak with the hospitalist on call if the hospitalist that took care of you is not available. Once you are discharged, your primary care physician will handle any further medical issues. Please note that NO REFILLS for any discharge medications will be authorized once you are discharged, as it is imperative that you return to your primary care physician (or establish a relationship with a primary care physician if you do not have one) for your aftercare needs so that they can reassess your need for medications and monitor your lab values.    Today   CHIEF COMPLAINT:   Chief Complaint  Patient presents with  . Chest Pain    VITAL SIGNS:  Blood pressure 147/71, pulse 96, temperature 98.3 F (36.8 C), temperature source Oral, resp. rate 19, height 5' 4.5" (1.638 m), weight 44.861 kg (98 lb 14.4 oz), SpO2 98 %.  I/O:   Intake/Output Summary (Last 24 hours) at 01/17/15 1203 Last data filed at 01/17/15 0934  Gross per 24 hour  Intake      3 ml  Output   3050 ml  Net  -3047 ml    PHYSICAL EXAMINATION:   Physical Exam  Constitutional: She is oriented to person, place, and time. She appears malnourished. She appears unhealthy. She appears cachectic.  HENT:  Head: Normocephalic and atraumatic.  Eyes: Conjunctivae and EOM are normal. Pupils are equal, round, and reactive to light.  Neck: Normal range of motion. Neck supple. No tracheal deviation present. No thyromegaly present.  Cardiovascular: Normal rate, regular rhythm and normal heart sounds.  Pulmonary/Chest: Effort normal and breath sounds normal. No respiratory distress. She has no wheezes. She exhibits no tenderness.  Occasional scattered wheezes.  Abdominal: Soft. Bowel sounds are normal. She exhibits no  distension. There is no tenderness.  Musculoskeletal: Normal range of motion.  Neurological: She is alert and oriented to person, place, and time. No cranial nerve deficit.  Skin: Skin is warm and dry. No rash noted.     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  Psychiatric: Mood and affect normal.   DATA REVIEW:  CBC  Recent Labs Lab 01/16/15 0438  WBC 11.8*  HGB 9.9*  HCT 32.1*  PLT 298    Chemistries   Recent Labs Lab 01/13/15 1830  01/16/15 0438  NA 135  < > 138  K 2.6*  < > 5.1  CL 97*  < > 102  CO2 29  < > 30  GLUCOSE 109*  < > 130*  BUN 7  < > 27*  CREATININE 0.73  < > 0.79  CALCIUM 8.1*  < > 8.4*  MG 1.7  --   --   AST 30  --   --   ALT 21  --   --   ALKPHOS 92  --   --   BILITOT 0.5  --   --   < > = values in this interval not displayed.  Cardiac Enzymes  Recent Labs Lab 01/14/15 0738  TROPONINI 9.61*    Microbiology Results  Results for orders placed or performed during the hospital encounter of 01/13/15  Culture, blood (routine x 2)     Status: None (Preliminary result)   Collection Time: 01/13/15  6:30 PM  Result Value Ref Range Status   Specimen Description BLOOD  Final   Special Requests BLOOD  Final   Culture NO GROWTH 4 DAYS  Final   Report Status PENDING  Incomplete  Culture, blood (routine x 2)     Status: None (Preliminary result)   Collection Time: 01/13/15  8:30 PM  Result Value Ref Range Status   Specimen Description BLOOD  Final   Special Requests NONE  Final   Culture NO GROWTH 4 DAYS  Final   Report Status PENDING  Incomplete    RADIOLOGY:  No results found.  EKG:   Orders placed or performed during the hospital encounter of 01/13/15  . EKG 12-Lead  . EKG 12-Lead  . EKG 12-Lead  . EKG 12-Lead  . EKG 12-Lead  . EKG 12-Lead      Management plans discussed with the patient, family and they are in agreement.  CODE STATUS:     Code Status Orders        Start     Ordered   01/14/15 1340   Full code   Continuous     01/14/15 1341      TOTAL TIME TAKING CARE OF THIS PATIENT: 38 minutes.    Margreat Widener M.D on 01/17/2015 at 12:03 PM  Between 7am to 6pm - Pager - [phone]  After 6pm go to [email or web address] - password [email or web address]  Office  [phone]  CC: Primary care physician; Pcp Not In System

## 2015-01-18 LAB — CULTURE, BLOOD (ROUTINE X 2)
CULTURE: NO GROWTH
Culture: NO GROWTH

## 2015-01-20 ENCOUNTER — Emergency Department: Payer: Medicare Other

## 2015-01-20 ENCOUNTER — Inpatient Hospital Stay
Admission: EM | Admit: 2015-01-20 | Discharge: 2015-01-22 | DRG: 281 | Disposition: A | Discharge status: Home Health | Payer: Medicare Other | Attending: Internal Medicine | Admitting: Internal Medicine

## 2015-01-20 ENCOUNTER — Encounter: Payer: Self-pay | Admitting: *Deleted

## 2015-01-20 DIAGNOSIS — Z803 Family history of malignant neoplasm of breast: Secondary | ICD-10-CM

## 2015-01-20 DIAGNOSIS — D649 Anemia, unspecified: Secondary | ICD-10-CM | POA: Diagnosis present

## 2015-01-20 DIAGNOSIS — L98499 Non-pressure chronic ulcer of skin of other sites with unspecified severity: Secondary | ICD-10-CM | POA: Diagnosis present

## 2015-01-20 DIAGNOSIS — I10 Essential (primary) hypertension: Secondary | ICD-10-CM | POA: Diagnosis present

## 2015-01-20 DIAGNOSIS — G894 Chronic pain syndrome: Secondary | ICD-10-CM | POA: Diagnosis present

## 2015-01-20 DIAGNOSIS — F17210 Nicotine dependence, cigarettes, uncomplicated: Secondary | ICD-10-CM | POA: Diagnosis present

## 2015-01-20 DIAGNOSIS — Z9981 Dependence on supplemental oxygen: Secondary | ICD-10-CM | POA: Diagnosis not present

## 2015-01-20 DIAGNOSIS — E785 Hyperlipidemia, unspecified: Secondary | ICD-10-CM | POA: Diagnosis present

## 2015-01-20 DIAGNOSIS — Z8614 Personal history of Methicillin resistant Staphylococcus aureus infection: Secondary | ICD-10-CM | POA: Diagnosis not present

## 2015-01-20 DIAGNOSIS — Z716 Tobacco abuse counseling: Secondary | ICD-10-CM | POA: Diagnosis present

## 2015-01-20 DIAGNOSIS — R079 Chest pain, unspecified: Secondary | ICD-10-CM | POA: Diagnosis present

## 2015-01-20 DIAGNOSIS — M549 Dorsalgia, unspecified: Secondary | ICD-10-CM | POA: Diagnosis present

## 2015-01-20 DIAGNOSIS — Z7951 Long term (current) use of inhaled steroids: Secondary | ICD-10-CM

## 2015-01-20 DIAGNOSIS — L97929 Non-pressure chronic ulcer of unspecified part of left lower leg with unspecified severity: Secondary | ICD-10-CM | POA: Diagnosis present

## 2015-01-20 DIAGNOSIS — I214 Non-ST elevation (NSTEMI) myocardial infarction: Secondary | ICD-10-CM | POA: Diagnosis present

## 2015-01-20 DIAGNOSIS — Z8249 Family history of ischemic heart disease and other diseases of the circulatory system: Secondary | ICD-10-CM | POA: Diagnosis not present

## 2015-01-20 DIAGNOSIS — Z7902 Long term (current) use of antithrombotics/antiplatelets: Secondary | ICD-10-CM | POA: Diagnosis not present

## 2015-01-20 DIAGNOSIS — I509 Heart failure, unspecified: Secondary | ICD-10-CM | POA: Diagnosis present

## 2015-01-20 DIAGNOSIS — Z95810 Presence of automatic (implantable) cardiac defibrillator: Secondary | ICD-10-CM

## 2015-01-20 DIAGNOSIS — I255 Ischemic cardiomyopathy: Secondary | ICD-10-CM | POA: Diagnosis present

## 2015-01-20 DIAGNOSIS — J441 Chronic obstructive pulmonary disease with (acute) exacerbation: Secondary | ICD-10-CM | POA: Diagnosis present

## 2015-01-20 DIAGNOSIS — I2510 Atherosclerotic heart disease of native coronary artery without angina pectoris: Secondary | ICD-10-CM | POA: Diagnosis present

## 2015-01-20 DIAGNOSIS — I739 Peripheral vascular disease, unspecified: Secondary | ICD-10-CM | POA: Diagnosis present

## 2015-01-20 DIAGNOSIS — I253 Aneurysm of heart: Secondary | ICD-10-CM | POA: Diagnosis present

## 2015-01-20 DIAGNOSIS — I219 Acute myocardial infarction, unspecified: Secondary | ICD-10-CM | POA: Diagnosis present

## 2015-01-20 DIAGNOSIS — J449 Chronic obstructive pulmonary disease, unspecified: Secondary | ICD-10-CM

## 2015-01-20 DIAGNOSIS — I25119 Atherosclerotic heart disease of native coronary artery with unspecified angina pectoris: Principal | ICD-10-CM | POA: Diagnosis present

## 2015-01-20 DIAGNOSIS — I252 Old myocardial infarction: Secondary | ICD-10-CM | POA: Diagnosis not present

## 2015-01-20 DIAGNOSIS — L97909 Non-pressure chronic ulcer of unspecified part of unspecified lower leg with unspecified severity: Secondary | ICD-10-CM | POA: Diagnosis present

## 2015-01-20 DIAGNOSIS — R0602 Shortness of breath: Secondary | ICD-10-CM

## 2015-01-20 LAB — COMPREHENSIVE METABOLIC PANEL
ALT: 27 U/L (ref 14–54)
AST: 21 U/L (ref 15–41)
Albumin: 3 g/dL — ABNORMAL LOW (ref 3.5–5.0)
Alkaline Phosphatase: 70 U/L (ref 38–126)
Anion gap: 7 (ref 5–15)
BILIRUBIN TOTAL: 0.3 mg/dL (ref 0.3–1.2)
BUN: 25 mg/dL — ABNORMAL HIGH (ref 6–20)
CHLORIDE: 106 mmol/L (ref 101–111)
CO2: 31 mmol/L (ref 22–32)
CREATININE: 0.67 mg/dL (ref 0.44–1.00)
Calcium: 8.6 mg/dL — ABNORMAL LOW (ref 8.9–10.3)
GFR calc Af Amer: >60 mL/min (ref >60)
Glucose, Bld: 91 mg/dL (ref 65–99)
Potassium: 3.8 mmol/L (ref 3.5–5.1)
Sodium: 144 mmol/L (ref 135–145)
Total Protein: 6 g/dL — ABNORMAL LOW (ref 6.5–8.1)

## 2015-01-20 LAB — CBC
HCT: 39.2 % (ref 35.0–47.0)
Hemoglobin: 11.9 g/dL — ABNORMAL LOW (ref 12.0–16.0)
MCH: 22.4 pg — ABNORMAL LOW (ref 26.0–34.0)
MCHC: 30.3 g/dL — ABNORMAL LOW (ref 32.0–36.0)
MCV: 73.8 fL — ABNORMAL LOW (ref 80.0–100.0)
Platelets: 443 10*3/uL — ABNORMAL HIGH (ref 150–440)
RBC: 5.31 MIL/uL — ABNORMAL HIGH (ref 3.80–5.20)
RDW: 19 % — ABNORMAL HIGH (ref 11.5–14.5)
WBC: 19.5 10*3/uL — ABNORMAL HIGH (ref 3.6–11.0)

## 2015-01-20 LAB — TROPONIN I: Troponin I: 2.2 ng/mL — ABNORMAL HIGH (ref <0.031)

## 2015-01-20 LAB — BRAIN NATRIURETIC PEPTIDE: B NATRIURETIC PEPTIDE 5: 1333 pg/mL — AB (ref 0.0–100.0)

## 2015-01-20 MED ORDER — MORPHINE SULFATE 4 MG/ML IJ SOLN
INTRAMUSCULAR | Status: AC
  Filled 2015-01-20: qty 1

## 2015-01-20 MED ORDER — MORPHINE SULFATE 4 MG/ML IJ SOLN
4.0000 mg | Freq: Once | INTRAMUSCULAR | Status: AC
  Administered 2015-01-20: 4 mg via INTRAVENOUS

## 2015-01-20 MED ORDER — AMLODIPINE BESYLATE 5 MG PO TABS: 5.0000 mg | ORAL_TABLET | Freq: Every day | ORAL | Status: DC

## 2015-01-20 MED ORDER — ONDANSETRON HCL 4 MG/2ML IJ SOLN: 4.0000 mg | Freq: Four times a day (QID) | INTRAMUSCULAR | Status: DC | PRN

## 2015-01-20 MED ORDER — ONDANSETRON HCL 4 MG PO TABS: 4.0000 mg | ORAL_TABLET | Freq: Four times a day (QID) | ORAL | Status: DC | PRN

## 2015-01-20 MED ORDER — ONDANSETRON HCL 4 MG/2ML IJ SOLN
INTRAMUSCULAR | Status: AC
  Filled 2015-01-20: qty 2

## 2015-01-20 MED ORDER — MORPHINE SULFATE 4 MG/ML IJ SOLN
4.0000 mg | INTRAMUSCULAR | Status: DC | PRN
  Administered 2015-01-21: 4 mg via INTRAVENOUS
  Filled 2015-01-20: qty 1

## 2015-01-20 MED ORDER — TRAMADOL HCL 50 MG PO TABS
50.0000 mg | ORAL_TABLET | Freq: Four times a day (QID) | ORAL | Status: DC | PRN
  Administered 2015-01-21: 50 mg via ORAL
  Filled 2015-01-20: qty 1

## 2015-01-20 MED ORDER — ACETAMINOPHEN 325 MG PO TABS: 650.0000 mg | ORAL_TABLET | Freq: Four times a day (QID) | ORAL | Status: DC | PRN

## 2015-01-20 MED ORDER — CLOPIDOGREL BISULFATE 75 MG PO TABS
75.0000 mg | ORAL_TABLET | Freq: Every day | ORAL | Status: DC
  Administered 2015-01-21 – 2015-01-22 (×2): 75 mg via ORAL
  Filled 2015-01-20 (×2): qty 1

## 2015-01-20 MED ORDER — ONDANSETRON HCL 4 MG/2ML IJ SOLN
4.0000 mg | Freq: Once | INTRAMUSCULAR | Status: AC
  Administered 2015-01-20: 4 mg via INTRAVENOUS

## 2015-01-20 MED ORDER — MOMETASONE FURO-FORMOTEROL FUM 100-5 MCG/ACT IN AERO
2.0000 | INHALATION_SPRAY | Freq: Two times a day (BID) | RESPIRATORY_TRACT | Status: DC
  Administered 2015-01-21 – 2015-01-22 (×4): 2 via RESPIRATORY_TRACT
  Filled 2015-01-20: qty 8.8

## 2015-01-20 MED ORDER — ACETAMINOPHEN 650 MG RE SUPP: 650.0000 mg | Freq: Four times a day (QID) | RECTAL | Status: DC | PRN

## 2015-01-20 MED ORDER — OXYCODONE HCL ER 40 MG PO T12A
80.0000 mg | EXTENDED_RELEASE_TABLET | Freq: Two times a day (BID) | ORAL | Status: DC
  Administered 2015-01-21 – 2015-01-22 (×4): 80 mg via ORAL
  Filled 2015-01-20 (×4): qty 2

## 2015-01-20 MED ORDER — LEVALBUTEROL HCL 1.25 MG/0.5ML IN NEBU: 1.2500 mg | INHALATION_SOLUTION | Freq: Four times a day (QID) | RESPIRATORY_TRACT | Status: DC | PRN

## 2015-01-20 MED ORDER — LEVOFLOXACIN IN D5W 750 MG/150ML IV SOLN
750.0000 mg | INTRAVENOUS | Status: DC
  Administered 2015-01-21: 750 mg via INTRAVENOUS
  Filled 2015-01-20 (×2): qty 150

## 2015-01-20 MED ORDER — ATORVASTATIN CALCIUM 20 MG PO TABS
40.0000 mg | ORAL_TABLET | Freq: Every day | ORAL | Status: DC
  Administered 2015-01-21: 40 mg via ORAL
  Filled 2015-01-20: qty 2

## 2015-01-20 MED ORDER — SODIUM CHLORIDE 0.9 % IJ SOLN
3.0000 mL | Freq: Two times a day (BID) | INTRAMUSCULAR | Status: DC
  Administered 2015-01-21 (×3): 3 mL via INTRAVENOUS

## 2015-01-20 MED ORDER — CARVEDILOL 3.125 MG PO TABS
3.1250 mg | ORAL_TABLET | Freq: Two times a day (BID) | ORAL | Status: DC
  Administered 2015-01-22: 3.125 mg via ORAL
  Filled 2015-01-20 (×4): qty 1

## 2015-01-20 NOTE — ED Notes (Signed)
Pt to ED from home with 24 hours chest pain and SOB. Pt with recent cardiac hx of 3 MI's in past 2-3 months. Per EMS pt has severe blockages, but no treatment has been done thus far. Pt is AAOx3, given 325 baby asprin, 3 nitros and has nitro paste on with no relief from the pain. Pt states pain 10/10 still. Pt with PVC's on the monitor, NSR at this time. Pt states pain is "entire chest with sharp and pressure feeling." Pt hx of COPD.

## 2015-01-20 NOTE — H&P (Signed)
[place] [facility] [place] NAME: Lynn Butler    MR#:  [id number]  DATE OF BIRTH:  Dec 19, 1945  DATE OF ADMISSION:  01/20/2015  PRIMARY CARE PHYSICIAN: Pcp Not In System   REQUESTING/REFERRING PHYSICIAN: Edd Fabian  CHIEF COMPLAINT:   Chief Complaint  Patient presents with  . Chest Pain  . Shortness of Breath    HISTORY OF PRESENT ILLNESS:  Lynn Butler  is a 69 y.o. female who presents with worsening chest pain. Patient has been here frequently over the last couple of months with multiple MI. During her last admission a cardiac catheterization was performed and found completely occluded stent as well as severe disease in multiple vessels. She was told her only option was medical management. She returns today with progressively worsening chest pain in the setting of a resolving MI, as well as significant wheezing and shortness of breath likely related to COPD exacerbation. Hospitalists were called for admission for the same.  PAST MEDICAL HISTORY:   Past Medical History  Diagnosis Date  . COPD (chronic obstructive pulmonary disease)   . Hypertension   . Ischemic cardiomyopathy     a.   . Coronary artery disease     a. s/p multiple stenting in 2005  . History of ventricular tachycardia   . PVD (peripheral vascular disease)   . PAD (peripheral artery disease)   . HLD (hyperlipidemia)   . Chronic back pain   . MI, old   . MRSA (methicillin resistant staph aureus) culture positive     left foot wound  . CHF (congestive heart failure)     PAST SURGICAL HISTORY:   Past Surgical History  Procedure Laterality Date  . Insert / replace / remove pacemaker    . Vascular bypass surgery Bilateral   . Cardiac catheterization N/A 01/14/2015    Procedure: Left Heart Cath;  Surgeon: Wellington Hampshire, MD;  Location: [facility];  Service: Cardiovascular;  Laterality: N/A;    SOCIAL HISTORY:   History  Substance Use Topics  .  Smoking status: Current Every Day Smoker -- 0.50 packs/day    Types: Cigarettes  . Smokeless tobacco: Not on file  . Alcohol Use: No    FAMILY HISTORY:   Family History  Problem Relation Age of Onset  . CAD Other   . Breast cancer Mother   . Heart disease Mother   . Coronary artery disease Father     DRUG ALLERGIES:   Allergies  Allergen Reactions  . Nsaids Nausea And Vomiting and Other (See Comments)    Reaction:  GI distress and burning     MEDICATIONS AT HOME:   Prior to Admission medications   Medication Sig Start Date End Date Taking? Authorizing Provider  albuterol (PROAIR HFA) 108 (90 BASE) MCG/ACT inhaler Inhale 1-2 puffs into the lungs every 4 (four) hours as needed for wheezing or shortness of breath. 07/28/14  Yes Marijean Heath, NP  albuterol (PROVENTIL) (2.5 MG/3ML) 0.083% nebulizer solution Take 3 mLs (2.5 mg total) by nebulization every 6 (six) hours as needed for wheezing or shortness of breath. 01/17/15  Yes Gladstone Lighter, MD  amLODipine (NORVASC) 5 MG tablet Take 5 mg by mouth daily.   Yes Historical Provider, MD  atorvastatin (LIPITOR) 40 MG tablet Take 1 tablet (40 mg total) by mouth daily at 6 PM. 01/17/15  Yes Gladstone Lighter, MD  carvedilol (COREG) 3.125 MG tablet Take 1 tablet (3.125 mg total) by mouth  2 (two) times daily with a meal. 01/17/15  Yes Gladstone Lighter, MD  clopidogrel (PLAVIX) 75 MG tablet Take 75 mg by mouth daily.   Yes Historical Provider, MD  diazepam (VALIUM) 10 MG tablet Take 1 tablet (10 mg total) by mouth every 12 (twelve) hours as needed for anxiety. 01/17/15  Yes Gladstone Lighter, MD  Fluticasone Furoate-Vilanterol (BREO ELLIPTA) 100-25 MCG/INH AEPB Inhale 1 puff into the lungs daily.   Yes Historical Provider, MD  Fluticasone-Salmeterol (ADVAIR) 250-50 MCG/DOSE AEPB Inhale 1 puff into the lungs 2 (two) times daily.   Yes Historical Provider, MD  furosemide (LASIX) 20 MG tablet Take 20 mg by mouth daily as needed for  edema.   Yes Historical Provider, MD  gabapentin (NEURONTIN) 300 MG capsule 1 tab po at bedtime 1st day, 1 tablet bid second day, then 1 tablet tid 01/09/15  Yes Melynda Ripple, MD  HYDROmorphone (DILAUDID) 4 MG tablet Take 1 tablet (4 mg total) by mouth every 6 (six) hours as needed for severe pain. 01/17/15  Yes Gladstone Lighter, MD  LORazepam (ATIVAN) 1 MG tablet Take 1 tablet (1 mg total) by mouth every 8 (eight) hours as needed for anxiety. 01/17/15  Yes Gladstone Lighter, MD  nicotine (NICODERM CQ - DOSED IN MG/24 HOURS) 21 mg/24hr patch Place 1 patch (21 mg total) onto the skin daily. 01/17/15  Yes Gladstone Lighter, MD  OxyCODONE (OXYCONTIN) 80 mg T12A 12 hr tablet Take 1 tablet (80 mg total) by mouth every 12 (twelve) hours. 12/19/14  Yes Dustin Flock, MD  predniSONE (STERAPRED UNI-PAK 21 TAB) 10 MG (21) TBPK tablet Take 1 tablet (10 mg total) by mouth daily. 6 tabs PO x 1 day 5 tabs PO x 1 day 4 tabs PO x 1 day 3 tabs PO x 1 day 2 tabs PO x 1 day 1 tab PO x 1 day and stop 01/17/15  Yes Gladstone Lighter, MD  traMADol (ULTRAM) 50 MG tablet Take 1 tablet (50 mg total) by mouth every 6 (six) hours as needed. Patient taking differently: Take 50 mg by mouth every 6 (six) hours as needed for moderate pain.  01/09/15  Yes Melynda Ripple, MD  zolpidem (AMBIEN) 10 MG tablet Take 1 tablet (10 mg total) by mouth at bedtime as needed for sleep. 01/17/15  Yes Gladstone Lighter, MD  benzonatate (TESSALON) 100 MG capsule Take 1 capsule (100 mg total) by mouth 3 (three) times daily. 01/17/15   Gladstone Lighter, MD  Chlorhexidine Gluconate Cloth 2 % PADS Apply 6 each topically daily at 6 (six) AM. Patient not taking: Reported on 01/13/2015 12/30/14   Fritzi Mandes, MD  chlorpheniramine-HYDROcodone (TUSSIONEX) 10-8 MG/5ML SUER Take 10 mLs by mouth every 12 (twelve) hours. 01/17/15   Gladstone Lighter, MD  collagenase (SANTYL) ointment Apply topically daily. Patient not taking: Reported on 01/13/2015 12/19/14    Dustin Flock, MD  nitroGLYCERIN (NITROSTAT) 0.4 MG SL tablet Place 0.4 mg under the tongue every 5 (five) minutes as needed for chest pain.    Historical Provider, MD    REVIEW OF SYSTEMS:  Review of Systems  Constitutional: Negative for fever, chills, weight loss and malaise/fatigue.  HENT: Negative for ear pain, hearing loss and tinnitus.   Eyes: Negative for blurred vision, double vision, pain and redness.  Respiratory: Positive for cough, shortness of breath and wheezing. Negative for hemoptysis.   Cardiovascular: Positive for chest pain. Negative for palpitations, orthopnea and leg swelling.  Gastrointestinal: Positive for nausea. Negative for vomiting, abdominal pain, diarrhea  and constipation.  Genitourinary: Negative for dysuria, frequency and hematuria.  Musculoskeletal: Negative for back pain, joint pain and neck pain.  Skin:       No acne, rash, or lesions  Neurological: Negative for dizziness, tremors, focal weakness and weakness.  Endo/Heme/Allergies: Negative for polydipsia. Does not bruise/bleed easily.  Psychiatric/Behavioral: Negative for depression. The patient is not nervous/anxious and does not have insomnia.      VITAL SIGNS:   Filed Vitals:   01/20/15 2145 01/20/15 2200 01/20/15 2215 01/20/15 2230  BP: 145/98 134/71 156/81 143/71  Pulse: 46 46 40 49  Temp:      TempSrc:      Resp: 25 14 20 14   Height:      Weight:      SpO2: 97% 97% 96% 97%   Wt Readings from Last 3 Encounters:  01/20/15 44.453 kg (98 lb)  01/16/15 44.861 kg (98 lb 14.4 oz)  01/09/15 40.824 kg (90 lb)    PHYSICAL EXAMINATION:  Physical Exam  Constitutional: She is oriented to person, place, and time. She appears well-developed and well-nourished. No distress.  HENT:  Head: Normocephalic and atraumatic.  Mouth/Throat: Oropharynx is clear and moist.  Eyes: Conjunctivae and EOM are normal. Pupils are equal, round, and reactive to light. No scleral icterus.  Neck: Normal range of  motion. Neck supple. No JVD present. No thyromegaly present.  Cardiovascular: Normal rate, regular rhythm and intact distal pulses.  Exam reveals no gallop and no friction rub.   No murmur heard. Respiratory: She is in respiratory distress (mild). She has wheezes. She has no rales.  GI: Soft. Bowel sounds are normal. She exhibits no distension. There is no tenderness.  Musculoskeletal: Normal range of motion. She exhibits no edema.  No arthritis, no gout  Lymphadenopathy:    She has no cervical adenopathy.  Neurological: She is alert and oriented to person, place, and time. No cranial nerve deficit.  No dysarthria, no aphasia  Skin: Skin is warm and dry. No rash noted. No erythema.  Left lower extremity healing ulcer  Psychiatric: She has a normal mood and affect. Her behavior is normal. Judgment and thought content normal.    LABORATORY PANEL:   CBC  Recent Labs Lab 01/20/15 2043  WBC 19.5*  HGB 11.9*  HCT 39.2  PLT 443*   ------------------------------------------------------------------------------------------------------------------  Chemistries   Recent Labs Lab 01/20/15 2043  NA 144  K 3.8  CL 106  CO2 31  GLUCOSE 91  BUN 25*  CREATININE 0.67  CALCIUM 8.6*  AST 21  ALT 27  ALKPHOS 70  BILITOT 0.3   ------------------------------------------------------------------------------------------------------------------  Cardiac Enzymes  Recent Labs Lab 01/20/15 2043  TROPONINI 2.20*   ------------------------------------------------------------------------------------------------------------------  RADIOLOGY:  Dg Chest 2 View  01/20/2015   CLINICAL DATA:  Chest pain and shortness of breath for 24 hours.  EXAM: CHEST  2 VIEW  COMPARISON:  Single view of the chest 01/15/2015.  FINDINGS: There is cardiomegaly without edema. No pneumothorax identified. Very small bilateral pleural effusions are seen. AICD is in place.  IMPRESSION: Cardiomegaly and very small  bilateral pleural effusions. Negative for pulmonary edema.   Electronically Signed   By: Inge Rise M.D.   On: 01/20/2015 21:24    EKG:   Orders placed or performed during the hospital encounter of 01/20/15  . ED EKG  . ED EKG    IMPRESSION AND PLAN:  Principal Problem:   Myocardial infarction in recovery phase - troponin elevated, but trending down. Most  recent MI had a troponin peak greater than 20. Nitroglycerin has been minimally helpful, morphine has helped alleviate her chest pain some. Cardiac catheter during her last hospital stay revealed diffuse significant coronary disease, and she was told her only option was medical management. We'll admit her, trend her troponins, start on heparin, get a cardiology consult, use nitroglycerin and if needed morphine for pain control. Control her heart rate and blood pressure, and treat other medical problems as below. Active Problems:   COPD exacerbation - likely contributing to strain on her heart, Worsening her angina. Will treat her with Levaquin she does have a significantly elevated white count, as well as Xopenex nebs to help prevent tachycardia. We will hold off on steroids at this time.   CAD (coronary artery disease) -  continue appropriate home medications for this, including beta blocker and statin   CHF (congestive heart failure) -  hold home dose Lasix for now she has no significant peripheral edema.    HTN (hypertension) - continue home medications for this, currently controlled    Leg ulcer - wound consult for recommendations.   All the records are reviewed and case discussed with ED provider. Management plans discussed with the patient and/or family.  DVT PROPHYLAXIS: Systemic anticoagulation  ADMISSION STATUS: Inpatient  CODE STATUS: Full   TOTAL TIME TAKING CARE OF THIS PATIENT: 50  minutes.    Francis Yardley [place] 01/20/2015, 10:35 PM  [facility]  Office  [phone]  CC: Primary care  physician; Pcp Not In System

## 2015-01-20 NOTE — ED Provider Notes (Signed)
[facility] Emergency Department Provider Note  ____________________________________________  Time seen: Approximately 8:46 PM  I have reviewed the triage vital signs and the nursing notes.   HISTORY  Chief Complaint Chest Pain and Shortness of Breath    HPI Lynn Butler is a 69 y.o. 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 Patient was seen here on 01/13/2015 and admitted for NSTEMI. She underwent left heart catheterization on 01/15/2015 and was found to have diffuse left circumflex disease that was not amenable to PCI and the decision was made to manage her medically. She was discharged on 01/17/2015 and had been feeling well however for the past 24 hours for pain has returned. It does not radiate, is not pleuritic, it is associated with shortness of breath and is worse with exertion. Currently her pain is severe. She called EMS and received 325 mg of aspirin by mouth as well as 3 syllable nitroglycerin tabs however her pain persists. She has had cough and "maybe a little" fever.   Past Medical History  Diagnosis Date  . COPD (chronic obstructive pulmonary disease)   . Hypertension   . Ischemic cardiomyopathy     a.   . Coronary artery disease     a. s/p multiple stenting in 2005  . History of ventricular tachycardia   . PVD (peripheral vascular disease)   . PAD (peripheral artery disease)   . HLD (hyperlipidemia)   . Chronic back pain   . MI, old   . MRSA (methicillin resistant staph aureus) culture positive     left foot wound  . CHF (congestive heart failure)     Patient Active Problem List   Diagnosis Date Noted  . Acute MI 01/13/2015  . Chest pain 12/28/2014  . COPD exacerbation 12/16/2014  . Protein-calorie malnutrition, severe 07/22/2014  . Acute on  chronic respiratory failure with hypoxemia 07/21/2014  . COPD mixed type 07/21/2014  . CAD (coronary artery disease) 07/21/2014  . CHF (congestive heart failure) 07/21/2014  . PVD (peripheral vascular disease) 07/21/2014  . Leg ulcer 07/21/2014  . Skin lesion of face 07/21/2014  . Adult failure to thrive 07/21/2014  . Claudication of both lower extremities 07/21/2014  . Smoker 07/21/2014    Past Surgical History  Procedure Laterality Date  . Insert / replace / remove pacemaker    . Vascular bypass surgery Bilateral   . Cardiac catheterization N/A 01/14/2015    Procedure: Left Heart Cath;  Surgeon: Wellington Hampshire, MD;  Location: [facility];  Service: Cardiovascular;  Laterality: N/A;    Current Outpatient Rx  Name  Route  Sig  Dispense  Refill  . albuterol (PROAIR HFA) 108 (90 BASE) MCG/ACT inhaler   Inhalation   Inhale 1-2 puffs into the lungs every 4 (four) hours as needed for wheezing or shortness of breath.         Marland Kitchen albuterol (PROVENTIL) (2.5 MG/3ML) 0.083% nebulizer solution   Nebulization   Take 3 mLs (2.5 mg total) by nebulization every 6 (six) hours as needed for wheezing or shortness of breath.   75 mL   0   . amLODipine (NORVASC) 5 MG tablet   Oral   Take 5 mg by mouth daily.         Marland Kitchen atorvastatin (LIPITOR) 40 MG tablet   Oral   Take 1  tablet (40 mg total) by mouth daily at 6 PM.   30 tablet   0   . carvedilol (COREG) 3.125 MG tablet   Oral   Take 1 tablet (3.125 mg total) by mouth 2 (two) times daily with a meal.   60 tablet   2   . clopidogrel (PLAVIX) 75 MG tablet   Oral   Take 75 mg by mouth daily.         . diazepam (VALIUM) 10 MG tablet   Oral   Take 1 tablet (10 mg total) by mouth every 12 (twelve) hours as needed for anxiety.   20 tablet   0   . Fluticasone Furoate-Vilanterol (BREO ELLIPTA) 100-25 MCG/INH AEPB   Inhalation   Inhale 1 puff into the lungs daily.         . Fluticasone-Salmeterol (ADVAIR) 250-50 MCG/DOSE  AEPB   Inhalation   Inhale 1 puff into the lungs 2 (two) times daily.         . furosemide (LASIX) 20 MG tablet   Oral   Take 20 mg by mouth daily as needed for edema.         . gabapentin (NEURONTIN) 300 MG capsule      1 tab po at bedtime 1st day, 1 tablet bid second day, then 1 tablet tid   20 capsule   0   . HYDROmorphone (DILAUDID) 4 MG tablet   Oral   Take 1 tablet (4 mg total) by mouth every 6 (six) hours as needed for severe pain.   12 tablet   0   . LORazepam (ATIVAN) 1 MG tablet   Oral   Take 1 tablet (1 mg total) by mouth every 8 (eight) hours as needed for anxiety.   12 tablet   0   . nicotine (NICODERM CQ - DOSED IN MG/24 HOURS) 21 mg/24hr patch   Transdermal   Place 1 patch (21 mg total) onto the skin daily.   28 patch   0   . OxyCODONE (OXYCONTIN) 80 mg T12A 12 hr tablet   Oral   Take 1 tablet (80 mg total) by mouth every 12 (twelve) hours.   60 tablet   0   . predniSONE (STERAPRED UNI-PAK 21 TAB) 10 MG (21) TBPK tablet   Oral   Take 1 tablet (10 mg total) by mouth daily. 6 tabs PO x 1 day 5 tabs PO x 1 day 4 tabs PO x 1 day 3 tabs PO x 1 day 2 tabs PO x 1 day 1 tab PO x 1 day and stop   21 tablet   0   . traMADol (ULTRAM) 50 MG tablet   Oral   Take 1 tablet (50 mg total) by mouth every 6 (six) hours as needed. Patient taking differently: Take 50 mg by mouth every 6 (six) hours as needed for moderate pain.    15 tablet   0   . zolpidem (AMBIEN) 10 MG tablet   Oral   Take 1 tablet (10 mg total) by mouth at bedtime as needed for sleep.   10 tablet   0   . benzonatate (TESSALON) 100 MG capsule   Oral   Take 1 capsule (100 mg total) by mouth 3 (three) times daily.   30 capsule   0   . Chlorhexidine Gluconate Cloth 2 % PADS   Topical   Apply 6 each topically daily at 6 (six) AM. Patient not taking: Reported on 01/13/2015  6 each   0   . chlorpheniramine-HYDROcodone (TUSSIONEX) 10-8 MG/5ML SUER   Oral   Take 10 mLs by mouth  every 12 (twelve) hours.   100 mL   0   . collagenase (SANTYL) ointment   Topical   Apply topically daily. Patient not taking: Reported on 01/13/2015   15 g   0   . nitroGLYCERIN (NITROSTAT) 0.4 MG SL tablet   Sublingual   Place 0.4 mg under the tongue every 5 (five) minutes as needed for chest pain.           Allergies Nsaids  Family History  Problem Relation Age of Onset  . CAD Other   . Breast cancer Mother   . Heart disease Mother   . Coronary artery disease Father     Social History History  Substance Use Topics  . Smoking status: Current Every Day Smoker -- 0.50 packs/day    Types: Cigarettes  . Smokeless tobacco: Not on file  . Alcohol Use: No    Review of Systems Constitutional: +? fever/chills Eyes: No visual changes. ENT: No sore throat. Cardiovascular: +chest pain. Respiratory: +shortness of breath. Gastrointestinal: No abdominal pain.  No nausea, no vomiting.  No diarrhea.  No constipation. Genitourinary: Negative for dysuria. Musculoskeletal: Negative for back pain. Skin: Negative for rash. Neurological: Negative for headaches, focal weakness or numbness.  10-point ROS otherwise negative.  ____________________________________________   PHYSICAL EXAM:  VITAL SIGNS: ED Triage Vitals  Enc Vitals Group     BP 01/20/15 2041 133/87 mmHg     Pulse Rate 01/20/15 2039 95     Resp 01/20/15 2039 18     Temp 01/20/15 2039 98.1 F (36.7 C)     Temp Source 01/20/15 2039 Oral     SpO2 01/20/15 2039 99 %     Weight 01/20/15 2039 98 lb (44.453 kg)     Height 01/20/15 2039 5\' 4"  (1.626 m)     Head Cir --      Peak Flow --      Pain Score 01/20/15 2041 10     Pain Loc --      Pain Edu? --      Excl. in [place]? --     Constitutional: Alert and oriented. Nontoxic appearing, tearful. Eyes: Conjunctivae are normal. PERRL. EOMI. Head: Atraumatic. Large chronic growth on the left forehead. Nose: No congestion/rhinnorhea. Mouth/Throat: Mucous membranes  are moist.  Oropharynx non-erythematous. Neck: No stridor.  Cardiovascular: Normal rate, regular rhythm. Grossly normal heart sounds.  Good peripheral circulation. Respiratory: Mild tachypnea, no increased work of breathing, diffuse expiratory wheeze with crackles. Gastrointestinal: Soft and nontender. No distention. No abdominal bruits. No CVA tenderness. Genitourinary: Deferred Musculoskeletal: No lower extremity tenderness nor edema.  No joint effusions. Neurologic:  Normal speech and language. No gross focal neurologic deficits are appreciated. Speech is normal. No gait instability. Skin:  Skin is warm, dry and intact. No rash noted. Psychiatric: Mood and affect are normal. Speech and behavior are normal.  ____________________________________________   LABS (all labs ordered are listed, but only abnormal results are displayed)  Labs Reviewed  CBC - Abnormal; Notable for the following:    WBC 19.5 (*)    RBC 5.31 (*)    Hemoglobin 11.9 (*)    MCV 73.8 (*)    MCH 22.4 (*)    MCHC 30.3 (*)    RDW 19.0 (*)    Platelets 443 (*)    All other components within normal limits  COMPREHENSIVE METABOLIC PANEL - Abnormal; Notable for the following:    BUN 25 (*)    Calcium 8.6 (*)    Total Protein 6.0 (*)    Albumin 3.0 (*)    All other components within normal limits  BRAIN NATRIURETIC PEPTIDE - Abnormal; Notable for the following:    B Natriuretic Peptide 1333.0 (*)    All other components within normal limits  TROPONIN I - Abnormal; Notable for the following:    Troponin I 2.20 (*)    All other components within normal limits   ____________________________________________  EKG  ED ECG REPORT I, Joanne Gavel, the attending physician, personally viewed and interpreted this ECG.   Date: 01/20/2015  EKG Time: 20:43  Rate: 96  Rhythm: sinus rhythm with frequent PVCs  Axis: Normal axis  Intervals: Normal intervals  ST&T Change: No acute ST segment elevation or Q waves in  inferior leads  ____________________________________________  RADIOLOGY  CXR ____________________________________________   PROCEDURES  Procedure(s) performed: None  Critical Care performed: Yes, see critical care note(s). Total critical care time spent 35 minutes.  ____________________________________________   INITIAL IMPRESSION / ASSESSMENT AND PLAN / ED COURSE  Pertinent labs & imaging results that were available during my care of the patient were reviewed by me and considered in my medical decision making (see chart for details).  Mearl Harewood is a 69 y.o. 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 She has known severe coronary artery disease which has not been amenable to PCI and attempt has been made at medical management. She is afebrile. Exam is notable for mild tachypnea with diffuse wheezing or crackles. Slight continued ACS. Plan for pain control, we'll check basic cardiac labs, obtain chest x-ray and anticipate readmission.   ----------------------------------------- 10:00 PM on 01/20/2015 -----------------------------------------  Labs notable for leukocytosis however chest x-ray negative for any acute infectious process. The leukocytosis may be stress-induced/catecholamine induced. BNP is elevated at 1300 however the patient does not appear volume overloaded on exam she does have pleural effusions on chest x-ray but no overt pulmonary edema. Troponin remain elevated but is down trending. My concern is that medical management of ACS is failing and she continues to be symptomatic that her pain is improved considerably with morphine. Discussed with hospitalist for admission and she may benefit from more aggressive management if she is thought to be reasonable candidate for  this. ____________________________________________   FINAL CLINICAL IMPRESSION(S) / ED DIAGNOSES  Final diagnoses:  Chest pain, unspecified chest pain type  SOB (shortness of breath)  Chronic obstructive pulmonary disease, unspecified COPD, unspecified chronic bronchitis type  NSTEMI (non-ST elevated myocardial infarction)      Joanne Gavel, MD 01/21/15 0000

## 2015-01-20 NOTE — ED Notes (Signed)
MD Gayle at bedside. 

## 2015-01-20 NOTE — ED Notes (Signed)
Pt requesting coffee at this time, RN spoke to MD Jannifer Franklin, verbal okay given at this time, pt given coffee at this time, tolerating well, no acute distress noted.

## 2015-01-20 NOTE — ED Notes (Signed)
Pt to Xray at this time

## 2015-01-21 LAB — APTT: APTT: 25 s (ref 24–36)

## 2015-01-21 LAB — BASIC METABOLIC PANEL
Anion gap: 7 (ref 5–15)
BUN: 28 mg/dL — AB (ref 6–20)
CALCIUM: 7.8 mg/dL — AB (ref 8.9–10.3)
CO2: 34 mmol/L — AB (ref 22–32)
Chloride: 103 mmol/L (ref 101–111)
Creatinine, Ser: 0.93 mg/dL (ref 0.44–1.00)
GFR calc Af Amer: >60 mL/min (ref >60)
GLUCOSE: 84 mg/dL (ref 65–99)
Potassium: 4.5 mmol/L (ref 3.5–5.1)
Sodium: 144 mmol/L (ref 135–145)

## 2015-01-21 LAB — HEPARIN LEVEL (UNFRACTIONATED): Heparin Unfractionated: 0.16 IU/mL — ABNORMAL LOW (ref 0.30–0.70)

## 2015-01-21 LAB — CBC
HCT: 35.9 % (ref 35.0–47.0)
Hemoglobin: 10.4 g/dL — ABNORMAL LOW (ref 12.0–16.0)
MCH: 21.7 pg — ABNORMAL LOW (ref 26.0–34.0)
MCHC: 29.1 g/dL — AB (ref 32.0–36.0)
MCV: 74.5 fL — AB (ref 80.0–100.0)
PLATELETS: 357 10*3/uL (ref 150–440)
RBC: 4.81 MIL/uL (ref 3.80–5.20)
RDW: 19.3 % — AB (ref 11.5–14.5)
WBC: 13 10*3/uL — ABNORMAL HIGH (ref 3.6–11.0)

## 2015-01-21 LAB — TROPONIN I
Troponin I: 2.54 ng/mL — ABNORMAL HIGH (ref <0.031)
Troponin I: 2.45 ng/mL — ABNORMAL HIGH (ref <0.031)
Troponin I: 2.39 ng/mL — ABNORMAL HIGH (ref <0.031)

## 2015-01-21 LAB — PROTIME-INR
INR: 1.03
PROTHROMBIN TIME: 13.7 s (ref 11.4–15.0)

## 2015-01-21 MED ORDER — ALBUTEROL SULFATE (2.5 MG/3ML) 0.083% IN NEBU: 2.5000 mg | INHALATION_SOLUTION | Freq: Four times a day (QID) | RESPIRATORY_TRACT | Status: DC | PRN

## 2015-01-21 MED ORDER — HEPARIN BOLUS VIA INFUSION
2700.0000 [IU] | Freq: Once | INTRAVENOUS | Status: AC
  Administered 2015-01-21: 2700 [IU] via INTRAVENOUS
  Filled 2015-01-21: qty 2700

## 2015-01-21 MED ORDER — ENOXAPARIN SODIUM 60 MG/0.6ML ~~LOC~~ SOLN
1.0000 mg/kg | Freq: Two times a day (BID) | SUBCUTANEOUS | Status: DC
  Administered 2015-01-21 – 2015-01-22 (×2): 45 mg via SUBCUTANEOUS
  Filled 2015-01-21 (×2): qty 0.6

## 2015-01-21 MED ORDER — HEPARIN (PORCINE) IN NACL 100-0.45 UNIT/ML-% IJ SOLN
500.0000 [IU]/h | INTRAMUSCULAR | Status: DC
  Administered 2015-01-21: 500 [IU]/h via INTRAVENOUS
  Filled 2015-01-21: qty 250

## 2015-01-21 MED ORDER — ISOSORBIDE MONONITRATE ER 60 MG PO TB24
60.0000 mg | ORAL_TABLET | Freq: Every day | ORAL | Status: DC
  Administered 2015-01-21 – 2015-01-22 (×2): 60 mg via ORAL
  Filled 2015-01-21 (×2): qty 1

## 2015-01-21 MED ORDER — LEVOFLOXACIN IN D5W 750 MG/150ML IV SOLN
750.0000 mg | INTRAVENOUS | Status: DC
  Filled 2015-01-21: qty 150

## 2015-01-21 MED ORDER — FUROSEMIDE 10 MG/ML IJ SOLN
40.0000 mg | Freq: Two times a day (BID) | INTRAMUSCULAR | Status: DC
  Administered 2015-01-21 – 2015-01-22 (×3): 40 mg via INTRAVENOUS
  Filled 2015-01-21 (×3): qty 4

## 2015-01-21 MED ORDER — HYDROMORPHONE HCL 2 MG PO TABS
4.0000 mg | ORAL_TABLET | Freq: Four times a day (QID) | ORAL | Status: DC | PRN
  Administered 2015-01-21 – 2015-01-22 (×2): 4 mg via ORAL
  Filled 2015-01-21 (×2): qty 2

## 2015-01-21 MED ORDER — DIAZEPAM 5 MG PO TABS
5.0000 mg | ORAL_TABLET | Freq: Two times a day (BID) | ORAL | Status: DC | PRN
  Administered 2015-01-21 – 2015-01-22 (×2): 5 mg via ORAL
  Filled 2015-01-21 (×2): qty 1

## 2015-01-21 MED ORDER — ENSURE ENLIVE PO LIQD
237.0000 mL | Freq: Three times a day (TID) | ORAL | Status: DC
  Administered 2015-01-21 – 2015-01-22 (×3): 237 mL via ORAL

## 2015-01-21 MED ORDER — ZOLPIDEM TARTRATE 5 MG PO TABS
5.0000 mg | ORAL_TABLET | Freq: Every evening | ORAL | Status: DC | PRN
  Administered 2015-01-21 (×2): 5 mg via ORAL
  Filled 2015-01-21 (×2): qty 1

## 2015-01-21 MED ORDER — PREDNISONE 50 MG PO TABS
50.0000 mg | ORAL_TABLET | Freq: Every day | ORAL | Status: DC
  Administered 2015-01-21 – 2015-01-22 (×2): 50 mg via ORAL
  Filled 2015-01-21 (×2): qty 1

## 2015-01-21 NOTE — Progress Notes (Signed)
Initial Nutrition Assessment  DOCUMENTATION CODES:  Severe malnutrition in context of chronic illness  INTERVENTION:  1) Meals/Snacks: cater to pt preferences; pt may benefit from smaller, more frequent meals 2) Medical Food Supplement: pt likes Ensure; recommend sending TID with meals (Ensure Enlive, each supplement provides 350 kcal and 20 grams of protein)  NUTRITION DIAGNOSIS:  Inadequate oral intake related to chronic illness as evidenced by severe depletion of muscle mass, severe depletion of body fat.  GOAL:  Patient will meet greater than or equal to 90% of their needs   MONITOR:   (Energy Intake, Anthropometrics, Electrolyte/Renal profile, Glucose Profile, Digestive system)  REASON FOR ASSESSMENT:   (RD Screen, recent admission, postive MST)    ASSESSMENT:  Pt admitted with chest pain, NSTEMI  PMHx:  Past Medical History  Diagnosis Date  . COPD (chronic obstructive pulmonary disease)   . Hypertension   . Ischemic cardiomyopathy     a.   . Coronary artery disease     a. s/p multiple stenting in 2005  . History of ventricular tachycardia   . PVD (peripheral vascular disease)   . PAD (peripheral artery disease)   . HLD (hyperlipidemia)   . Chronic back pain   . MI, old   . MRSA (methicillin resistant staph aureus) culture positive     left foot wound  . CHF (congestive heart failure)     Diet Order:Diet Heart Room service appropriate?: Yes; Fluid consistency:: Thin  Current Nutrition: no recorded po intake  Food/Nutrition-Related History: PO intake has been down for a while. Pt reports she likes Ensure but only drinks when she can afford it. Based on previous reported wt loss and low BMI, intake appears to have not meet needs for several years   Medications: lasix, prednisone  Electrolyte/Renal Profile and Glucose Profile:   Recent Labs Lab 01/16/15 0438 01/20/15 2043 01/21/15 0614  NA 138 144 144  K 5.1 3.8 4.5  CL 102 106 103  CO2 30 31  34*  BUN 27* 25* 28*  CREATININE 0.79 0.67 0.93  CALCIUM 8.4* 8.6* 7.8*  GLUCOSE 130* 91 84   Protein Profile:   Recent Labs Lab 01/20/15 2043  ALBUMIN 3.0*     Nutrition-Focused Physical Exam Findings: Nutrition-Focused physical exam completed. Findings are severe fat depletion, severe muscle depletion.    Weight Change: Pt reports she weighed around 185 pounds in 2005, gradual wt loss over time. Current wt 101 pounds. BMI 17.41 (underweight)  Height:  Ht Readings from Last 1 Encounters:  01/20/15 5\' 4"  (1.626 m)    Weight:  Wt Readings from Last 1 Encounters:  01/21/15 101 lb 8 oz (46.04 kg)   Filed Weights   01/20/15 2039 01/20/15 2327 01/21/15 0654  Weight: 98 lb (44.453 kg) 100 lb 9.6 oz (45.632 kg) 101 lb 8 oz (46.04 kg)     Wt Readings from Last 10 Encounters:  01/21/15 101 lb 8 oz (46.04 kg)  01/16/15 98 lb 14.4 oz (44.861 kg)  01/09/15 90 lb (40.824 kg)  12/30/14 97 lb 9.6 oz (44.271 kg)  12/16/14 89 lb 14.4 oz (40.778 kg)  11/26/14 107 lb 1.6 oz (48.58 kg)  07/28/14 102 lb 15.3 oz (46.7 kg)    BMI:  Body mass index is 17.41 kg/(m^2).  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

## 2015-01-21 NOTE — Plan of Care (Signed)
Problem: Phase I Progression Outcomes Goal: Anginal pain relieved Outcome: Progressing Pt readmitted to the hospital for CP and SOB. Pt continues to have CP after interventions, See [facility] for details. Pt says pain has decreased for awhile after interventions but still there. Pt has chronic pain and says her pain is never below a 7/10 pain. HR 70-90s NSR, BP 100-150/50-80s, Pt on 4L [place] (home 4L O2) SpO2 > 95%.

## 2015-01-21 NOTE — Progress Notes (Signed)
ANTIBIOTIC CONSULT NOTE - INITIAL  Pharmacy Consult for Levaquin Indication: COPD exacerbation  Allergies  Allergen Reactions  . Nsaids Nausea And Vomiting and Other (See Comments)    Reaction:  GI distress and burning     Patient Measurements: Height: 5\' 4"  (162.6 cm) Weight: 101 lb 8 oz (46.04 kg) IBW/kg (Calculated) : 54.7 Adjusted Body Weight:   Vital Signs: Temp: 97.8 F (36.6 C) (06/24 0851) Temp Source: Oral (06/24 0851) BP: 107/69 mmHg (06/24 1729) Pulse Rate: 75 (06/24 1729) Intake/Output from previous day: 06/23 0701 - 06/24 0700 In: 630 [P.O.:480; IV Piggyback:150] Out: 400 [Urine:400] Intake/Output from this shift: Total I/O In: -  Out: 2200 [Urine:2200]  Labs:  Recent Labs  01/20/15 2043 01/21/15 0614  WBC 19.5* 13.0*  HGB 11.9* 10.4*  PLT 443* 357  CREATININE 0.67 0.93   Estimated Creatinine Clearance: 42 mL/min (by C-G formula based on Cr of 0.93). No results for input(s): VANCOTROUGH, VANCOPEAK, VANCORANDOM, GENTTROUGH, GENTPEAK, GENTRANDOM, TOBRATROUGH, TOBRAPEAK, TOBRARND, AMIKACINPEAK, AMIKACINTROU, AMIKACIN in the last 72 hours.   Microbiology: Recent Results (from the past 720 hour(s))  MRSA PCR Screening     Status: Abnormal   Collection Time: 12/29/14  5:23 PM  Result Value Ref Range Status   MRSA by PCR POSITIVE (A) NEGATIVE Final    Comment:        The GeneXpert MRSA Assay (FDA approved for NASAL specimens only), is one component of a comprehensive MRSA colonization surveillance program. It is not intended to diagnose MRSA infection nor to guide or monitor treatment for MRSA infections. CRITICAL RESULT CALLED TO, READ BACK BY AND VERIFIED WITH: GIVEN TO JESSICA CHRISTMAS ON 12/29/14 AT 1905 BY JEF   Culture, blood (routine x 2)     Status: None   Collection Time: 01/13/15  6:30 PM  Result Value Ref Range Status   Specimen Description BLOOD  Final   Special Requests BLOOD  Final   Culture NO GROWTH 5 DAYS  Final   Report  Status 01/18/2015 FINAL  Final  Culture, blood (routine x 2)     Status: None   Collection Time: 01/13/15  8:30 PM  Result Value Ref Range Status   Specimen Description BLOOD  Final   Special Requests NONE  Final   Culture NO GROWTH 5 DAYS  Final   Report Status 01/18/2015 FINAL  Final    Medical History: Past Medical History  Diagnosis Date  . COPD (chronic obstructive pulmonary disease)   . Hypertension   . Ischemic cardiomyopathy     a.   . Coronary artery disease     a. s/p multiple stenting in 2005  . History of ventricular tachycardia   . PVD (peripheral vascular disease)   . PAD (peripheral artery disease)   . HLD (hyperlipidemia)   . Chronic back pain   . MI, old   . MRSA (methicillin resistant staph aureus) culture positive     left foot wound  . CHF (congestive heart failure)     Medications:  Prescriptions prior to admission  Medication Sig Dispense Refill Last Dose  . albuterol (PROAIR HFA) 108 (90 BASE) MCG/ACT inhaler Inhale 1-2 puffs into the lungs every 4 (four) hours as needed for wheezing or shortness of breath.   01/20/2015 at Unknown time  . albuterol (PROVENTIL) (2.5 MG/3ML) 0.083% nebulizer solution Take 3 mLs (2.5 mg total) by nebulization every 6 (six) hours as needed for wheezing or shortness of breath. 75 mL 0 unknown  . amLODipine (  NORVASC) 5 MG tablet Take 5 mg by mouth daily.   01/20/2015 at Unknown time  . atorvastatin (LIPITOR) 40 MG tablet Take 1 tablet (40 mg total) by mouth daily at 6 PM. 30 tablet 0 01/20/2015 at Unknown time  . carvedilol (COREG) 3.125 MG tablet Take 1 tablet (3.125 mg total) by mouth 2 (two) times daily with a meal. 60 tablet 2 01/20/2015 at Unknown time  . clopidogrel (PLAVIX) 75 MG tablet Take 75 mg by mouth daily.   01/20/2015 at Unknown time  . diazepam (VALIUM) 10 MG tablet Take 1 tablet (10 mg total) by mouth every 12 (twelve) hours as needed for anxiety. 20 tablet 0 01/19/2015 at Unknown time  . Fluticasone  Furoate-Vilanterol (BREO ELLIPTA) 100-25 MCG/INH AEPB Inhale 1 puff into the lungs daily.   01/20/2015 at Unknown time  . Fluticasone-Salmeterol (ADVAIR) 250-50 MCG/DOSE AEPB Inhale 1 puff into the lungs 2 (two) times daily.   01/20/2015 at Unknown time  . furosemide (LASIX) 20 MG tablet Take 20 mg by mouth daily as needed for edema.   01/20/2015 at Unknown time  . gabapentin (NEURONTIN) 300 MG capsule 1 tab po at bedtime 1st day, 1 tablet bid second day, then 1 tablet tid 20 capsule 0 Past Week at Unknown time  . HYDROmorphone (DILAUDID) 4 MG tablet Take 1 tablet (4 mg total) by mouth every 6 (six) hours as needed for severe pain. 12 tablet 0 Past Week at Unknown time  . LORazepam (ATIVAN) 1 MG tablet Take 1 tablet (1 mg total) by mouth every 8 (eight) hours as needed for anxiety. 12 tablet 0 Past Week at Unknown time  . nicotine (NICODERM CQ - DOSED IN MG/24 HOURS) 21 mg/24hr patch Place 1 patch (21 mg total) onto the skin daily. 28 patch 0 Past Week at Unknown time  . OxyCODONE (OXYCONTIN) 80 mg T12A 12 hr tablet Take 1 tablet (80 mg total) by mouth every 12 (twelve) hours. 60 tablet 0 Past Week at Unknown time  . predniSONE (STERAPRED UNI-PAK 21 TAB) 10 MG (21) TBPK tablet Take 1 tablet (10 mg total) by mouth daily. 6 tabs PO x 1 day 5 tabs PO x 1 day 4 tabs PO x 1 day 3 tabs PO x 1 day 2 tabs PO x 1 day 1 tab PO x 1 day and stop 21 tablet 0 01/20/2015 at Unknown time  . traMADol (ULTRAM) 50 MG tablet Take 1 tablet (50 mg total) by mouth every 6 (six) hours as needed. (Patient taking differently: Take 50 mg by mouth every 6 (six) hours as needed for moderate pain. ) 15 tablet 0 Past Month at Unknown time  . zolpidem (AMBIEN) 10 MG tablet Take 1 tablet (10 mg total) by mouth at bedtime as needed for sleep. 10 tablet 0 01/19/2015 at Unknown time  . benzonatate (TESSALON) 100 MG capsule Take 1 capsule (100 mg total) by mouth 3 (three) times daily. 30 capsule 0 Not yet taken  . Chlorhexidine Gluconate  Cloth 2 % PADS Apply 6 each topically daily at 6 (six) AM. (Patient not taking: Reported on 01/13/2015) 6 each 0 Completed Course at Unknown time  . chlorpheniramine-HYDROcodone (TUSSIONEX) 10-8 MG/5ML SUER Take 10 mLs by mouth every 12 (twelve) hours. 100 mL 0 Not yet taken  . collagenase (SANTYL) ointment Apply topically daily. (Patient not taking: Reported on 01/13/2015) 15 g 0 Completed Course at Unknown time  . nitroGLYCERIN (NITROSTAT) 0.4 MG SL tablet Place 0.4 mg under the tongue  every 5 (five) minutes as needed for chest pain.   unknown at unknown   Assessment: CrCl = 42 ml/min   Goal of Therapy:  resolution of infection  Plan:  Levaquin 750 mg IV given on 6/24 @ 1:00. Levaquin 750 mg IV Q24H originally ordered.  Will adjust dose to Levaquin 750 mg IV Q48H to start 6/26 @ 1:00.   Eureka Valdes D 01/21/2015,5:55 PM

## 2015-01-21 NOTE — Consult Note (Signed)
ANTICOAGULATION CONSULT NOTE - Initial Consult  Pharmacy Consult for Heparin Drip  Indication: chest pain/ACS  Allergies  Allergen Reactions  . Nsaids Nausea And Vomiting and Other (See Comments)    Reaction:  GI distress and burning     Patient Measurements: Height: 5\' 4"  (162.6 cm) Weight: 100 lb 9.6 oz (45.632 kg) IBW/kg (Calculated) : 54.7 Heparin Dosing Weight: 45.6kg  Vital Signs: Temp: 97.9 F (36.6 C) (06/23 2327) Temp Source: Oral (06/23 2327) BP: 154/84 mmHg (06/23 2327) Pulse Rate: 76 (06/23 2327)  Labs:  Recent Labs  01/20/15 2043 01/21/15 0039  HGB 11.9*  --   HCT 39.2  --   PLT 443*  --   APTT  --  25  LABPROT  --  13.7  INR  --  1.03  CREATININE 0.67  --   TROPONINI 2.20* 2.54*    Estimated Creatinine Clearance: 48.5 mL/min (by C-G formula based on Cr of 0.67).   Medical History: Past Medical History  Diagnosis Date  . COPD (chronic obstructive pulmonary disease)   . Hypertension   . Ischemic cardiomyopathy     a.   . Coronary artery disease     a. s/p multiple stenting in 2005  . History of ventricular tachycardia   . PVD (peripheral vascular disease)   . PAD (peripheral artery disease)   . HLD (hyperlipidemia)   . Chronic back pain   . MI, old   . MRSA (methicillin resistant staph aureus) culture positive     left foot wound  . CHF (congestive heart failure)     Medications:  Scheduled:  . amLODipine  5 mg Oral Daily  . atorvastatin  40 mg Oral q1800  . carvedilol  3.125 mg Oral BID WC  . clopidogrel  75 mg Oral Daily  . levofloxacin (LEVAQUIN) IV  750 mg Intravenous Q24H  . mometasone-formoterol  2 puff Inhalation BID  . OxyCODONE  80 mg Oral Q12H  . sodium chloride  3 mL Intravenous Q12H   Infusions:  . heparin 500 Units/hr (01/21/15 0433)   PRN: acetaminophen **OR** acetaminophen, albuterol, morphine injection, ondansetron **OR** ondansetron (ZOFRAN) IV, traMADol, zolpidem Anti-infectives    Start     Dose/Rate Route  Frequency Ordered Stop   01/21/15 0000  levofloxacin (LEVAQUIN) IVPB 750 mg     750 mg 100 mL/hr over 90 Minutes Intravenous Every 24 hours 01/20/15 2354        Assessment: 69 y.o. female who presents with worsening chest pain in the setting of a resolving MI.   Goal of Therapy:  Heparin level 0.3-0.7 units/ml   Plan:  Ordered Heparin bolus of 2700 units, followed by a Heparin Drip at 500 units/hr.  Anti-Xa level ordered 6 hours after initiation of infusion on 6/24 at 10:30.   Olivia Canter, [place] Clinical Pharmacist 01/21/2015

## 2015-01-21 NOTE — Progress Notes (Signed)
Chronic chest pain which is worse on coughing. On high dose narcotics for same. Cath recently. Troponin likely up from recent NSTEMI. Await cardiology input. Pt NPO for any procedures if needed. If no further investigations planned by cardiology will d/c with pain clinic f/u.

## 2015-01-21 NOTE — Consult Note (Signed)
[facility] Cardiology  CARDIOLOGY CONSULT NOTE  Patient ID: Angelyne Terwilliger MRN: [id number] DOB/AGE: 1946/04/02 69 y.o.  Admit date: 01/20/2015 Referring Physician [facility] Primary Physician  Primary Cardiologist Fath Reason for Consultation recurrent chest pain  HPI: 69 year old female referred for evaluation of recurrent chest pain. The patient was recently hospitalized for non-ST elevation myocardial infarction. 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 The patient is status post ICD. Medical therapy is recommended. She apparently was in her usual state of health until yesterday when she experienced sternal chest tightness. She represented [facility] emergency room. EKG was nondiagnostic. Troponins were trending down, currently 2.39. She reports that her chest pain syndrome is improved today. She currently is on a heparin drip.  Review of systems complete and found to be negative unless listed above     Past Medical History  Diagnosis Date  . COPD (chronic obstructive pulmonary disease)   . Hypertension   . Ischemic cardiomyopathy     a.   . Coronary artery disease     a. s/p multiple stenting in 2005  . History of ventricular tachycardia   . PVD (peripheral vascular disease)   . PAD (peripheral artery disease)   . HLD (hyperlipidemia)   . Chronic back pain   . MI, old   . MRSA (methicillin resistant staph aureus) culture positive     left foot wound  . CHF (congestive heart failure)     Past Surgical History  Procedure Laterality Date  . Insert / replace / remove pacemaker    . Vascular bypass surgery Bilateral   .  Cardiac catheterization N/A 01/14/2015    Procedure: Left Heart Cath;  Surgeon: Wellington Hampshire, MD;  Location: [place] CV LAB;  Service: Cardiovascular;  Laterality: N/A;    Prescriptions prior to admission  Medication Sig Dispense Refill Last Dose  . albuterol (PROAIR HFA) 108 (90 BASE) MCG/ACT inhaler Inhale 1-2 puffs into the lungs every 4 (four) hours as needed for wheezing or shortness of breath.   01/20/2015 at Unknown time  . albuterol (PROVENTIL) (2.5 MG/3ML) 0.083% nebulizer solution Take 3 mLs (2.5 mg total) by nebulization every 6 (six) hours as needed for wheezing or shortness of breath. 75 mL 0 unknown  . amLODipine (NORVASC) 5 MG tablet Take 5 mg by mouth daily.   01/20/2015 at Unknown time  . atorvastatin (LIPITOR) 40 MG tablet Take 1 tablet (40 mg total) by mouth daily at 6 PM. 30 tablet 0 01/20/2015 at Unknown time  . carvedilol (COREG) 3.125 MG tablet Take 1 tablet (3.125 mg total) by mouth 2 (two) times daily with a meal. 60 tablet 2 01/20/2015 at Unknown time  . clopidogrel (PLAVIX) 75 MG tablet Take 75 mg by mouth daily.   01/20/2015 at Unknown time  . diazepam (VALIUM) 10 MG tablet Take 1 tablet (10 mg total) by mouth every 12 (twelve) hours as needed for anxiety. 20 tablet 0 01/19/2015 at Unknown time  . Fluticasone Furoate-Vilanterol (BREO ELLIPTA) 100-25 MCG/INH AEPB Inhale 1 puff into the lungs daily.   01/20/2015 at Unknown time  . Fluticasone-Salmeterol (ADVAIR) 250-50 MCG/DOSE AEPB Inhale 1 puff into the lungs 2 (two) times daily.   01/20/2015 at Unknown  time  . furosemide (LASIX) 20 MG tablet Take 20 mg by mouth daily as needed for edema.   01/20/2015 at Unknown time  . gabapentin (NEURONTIN) 300 MG capsule 1 tab po at bedtime 1st day, 1 tablet bid second day, then 1 tablet tid 20 capsule 0 Past Week at Unknown time  . HYDROmorphone (DILAUDID) 4 MG tablet Take 1 tablet (4 mg total) by mouth every 6 (six) hours as needed for severe pain. 12 tablet 0 Past Week at Unknown  time  . LORazepam (ATIVAN) 1 MG tablet Take 1 tablet (1 mg total) by mouth every 8 (eight) hours as needed for anxiety. 12 tablet 0 Past Week at Unknown time  . nicotine (NICODERM CQ - DOSED IN MG/24 HOURS) 21 mg/24hr patch Place 1 patch (21 mg total) onto the skin daily. 28 patch 0 Past Week at Unknown time  . OxyCODONE (OXYCONTIN) 80 mg T12A 12 hr tablet Take 1 tablet (80 mg total) by mouth every 12 (twelve) hours. 60 tablet 0 Past Week at Unknown time  . predniSONE (STERAPRED UNI-PAK 21 TAB) 10 MG (21) TBPK tablet Take 1 tablet (10 mg total) by mouth daily. 6 tabs PO x 1 day 5 tabs PO x 1 day 4 tabs PO x 1 day 3 tabs PO x 1 day 2 tabs PO x 1 day 1 tab PO x 1 day and stop 21 tablet 0 01/20/2015 at Unknown time  . traMADol (ULTRAM) 50 MG tablet Take 1 tablet (50 mg total) by mouth every 6 (six) hours as needed. (Patient taking differently: Take 50 mg by mouth every 6 (six) hours as needed for moderate pain. ) 15 tablet 0 Past Month at Unknown time  . zolpidem (AMBIEN) 10 MG tablet Take 1 tablet (10 mg total) by mouth at bedtime as needed for sleep. 10 tablet 0 01/19/2015 at Unknown time  . benzonatate (TESSALON) 100 MG capsule Take 1 capsule (100 mg total) by mouth 3 (three) times daily. 30 capsule 0 Not yet taken  . Chlorhexidine Gluconate Cloth 2 % PADS Apply 6 each topically daily at 6 (six) AM. (Patient not taking: Reported on 01/13/2015) 6 each 0 Completed Course at Unknown time  . chlorpheniramine-HYDROcodone (TUSSIONEX) 10-8 MG/5ML SUER Take 10 mLs by mouth every 12 (twelve) hours. 100 mL 0 Not yet taken  . collagenase (SANTYL) ointment Apply topically daily. (Patient not taking: Reported on 01/13/2015) 15 g 0 Completed Course at Unknown time  . nitroGLYCERIN (NITROSTAT) 0.4 MG SL tablet Place 0.4 mg under the tongue every 5 (five) minutes as needed for chest pain.   unknown at unknown   History   Social History  . Marital Status: Widowed    Spouse Name: N/A  . Number of Children: N/A  .  Years of Education: N/A   Occupational History  . Not on file.   Social History Main Topics  . Smoking status: Current Every Day Smoker -- 0.50 packs/day    Types: Cigarettes  . Smokeless tobacco: Not on file  . Alcohol Use: No  . Drug Use: No  . Sexual Activity: Not on file   Other Topics Concern  . Not on file   Social History Narrative    Family History  Problem Relation Age of Onset  . CAD Other   . Breast cancer Mother   . Heart disease Mother   . Coronary artery disease Father       Review of systems complete and found to be negative unless  listed above      PHYSICAL EXAM  General: The patient is a frail appearing patient looks older than her stated age. 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  Lungs: Clear bilaterally to auscultation and percussion. Heart: HRRR . Normal S1 and S2 without gallops or murmurs.  Abdomen: Bowel sounds are positive, abdomen soft and non-tender  Msk:  Back normal, normal gait. Normal strength and tone for age. Extremities: No clubbing, cyanosis or edema.   Neuro: Alert and oriented X 3. Psych:  Good affect, responds appropriately  Labs:   Lab Results  Component Value Date   WBC 13.0* 01/21/2015   HGB 10.4* 01/21/2015   HCT 35.9 01/21/2015   MCV 74.5* 01/21/2015   PLT 357 01/21/2015    Recent Labs Lab 01/20/15 2043 01/21/15 0614  NA 144 144  K 3.8 4.5  CL 106 103  CO2 31 34*  BUN 25* 28*  CREATININE 0.67 0.93  CALCIUM 8.6* 7.8*  PROT 6.0*  --   BILITOT 0.3  --   ALKPHOS 70  --   ALT 27  --   AST 21  --   GLUCOSE 91 84   Lab Results  Component Value Date   TROPONINI 2.39* 01/21/2015    Lab Results  Component Value Date   CHOL 176 01/14/2015   Lab Results  Component Value Date   HDL 58 01/14/2015   Lab Results  Component Value Date   LDLCALC 104* 01/14/2015   Lab Results  Component Value Date   TRIG 72 01/14/2015   Lab Results  Component Value Date   CHOLHDL 3.0  01/14/2015   No results found for: LDLDIRECT    Radiology: Dg Chest 1 View  12/28/2014   CLINICAL DATA:  Shortness of breath and chest pressure, has not taken medications for 1 week, history COPD, coronary artery disease post MI, hypertension, ischemic cardiomyopathy  EXAM: CHEST  1 VIEW  COMPARISON:  Portable exam 1722 hours compared to 12/16/2014  FINDINGS: LEFT subclavian AICD lead tip projects over RIGHT ventricle.  Normal heart size, mediastinal contours and pulmonary vascularity.  Atherosclerotic calcification aorta.  Lungs appear emphysematous with mild atelectasis versus infiltrate in RIGHT lower lobe.  Skin folds project over RIGHT chest.  Remaining lungs clear.  No pleural effusion or pneumothorax.  IMPRESSION: Minimal atelectasis versus infiltrate in RIGHT lower lobe.  Underlying emphysematous changes.   Electronically Signed   By: Lavonia Dana M.D.   On: 12/28/2014 17:38   Dg Chest 2 View  01/20/2015   CLINICAL DATA:  Chest pain and shortness of breath for 24 hours.  EXAM: CHEST  2 VIEW  COMPARISON:  Single view of the chest 01/15/2015.  FINDINGS: There is cardiomegaly without edema. No pneumothorax identified. Very small bilateral pleural effusions are seen. AICD is in place.  IMPRESSION: Cardiomegaly and very small bilateral pleural effusions. Negative for pulmonary edema.   Electronically Signed   By: Inge Rise M.D.   On: 01/20/2015 21:24   Ct Abdomen Pelvis W Contrast  12/28/2014   CLINICAL DATA:  69 year old female with diffuse abdominal and pelvic pain. History of small bowel obstruction surgery 1-1/2 months ago.  EXAM: CT ABDOMEN AND PELVIS WITH CONTRAST  TECHNIQUE: Multidetector CT imaging of the abdomen and pelvis was performed using the standard protocol following bolus administration of intravenous contrast.  CONTRAST:  45mL OMNIPAQUE IOHEXOL 300 MG/ML  SOLN  COMPARISON:  11/15/2014 and prior exams  FINDINGS: Lower  chest: Cardiomegaly, pacemaker lead and right lower lobe  atelectasis/scarring again noted.  Hepatobiliary: Intrahepatic and CBD dilatation again noted without obstructing cause identified. The CBD measures up to 1.4 cm in greatest diameter.  Pancreas: Unremarkable  Spleen: Unremarkable  Adrenals/Urinary Tract: Bilateral renal cortical atrophy and nonobstructing renal calculi again noted. There is no evidence of hydronephrosis or solid renal mass. The adrenal glands and bladder are unremarkable.  Stomach/Bowel: A moderate to large amount of stool within the descending/sigmoid colon and rectum noted. There is no evidence of bowel obstruction or focal bowel wall thickening.  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 Abdominal aortic atherosclerotic calcifications are noted without aneurysm. No enlarged lymph nodes are identified.  Reproductive: Unremarkable  Other: No free fluid, abscess or pneumoperitoneum. Anterior abdominal wall postoperative changes are noted without abscess. Mild diffuse subcutaneous edema is present.  Musculoskeletal: No acute or suspicious abnormalities. Degenerative changes within the lower lumbar spine again noted.  IMPRESSION: No evidence of acute abnormality.  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  Unchanged intrahepatic and CBD dilatation without obstructing cause identified. Consider ERCP/MRCP as indicated.  Moderate to large amount of colonic and rectal stool. No evidence of bowel obstruction.   Electronically Signed   By: Margarette Canada M.D.   On: 12/28/2014 20:07   Dg Chest Port 1 View  01/15/2015   CLINICAL DATA:  Acute onset of shortness of breath, wheezing and back pain. Initial encounter.  EXAM: PORTABLE CHEST - 1 VIEW  COMPARISON:  Chest radiograph performed 01/13/2015  FINDINGS: The lungs are well-aerated. There appears to be a stable 1.3 cm nodule at the right midlung zone, unchanged from 2012 and likely  benign. Mild left basilar opacity likely reflects atelectasis. No pleural effusion or pneumothorax is seen.  The cardiomediastinal silhouette is within normal limits. No acute osseous abnormalities are seen.  IMPRESSION: 1. Mild left basilar opacity likely reflects atelectasis. 2. Stable 1.3 cm nodule at the right midlung zone is unchanged from 2012 and likely benign.   Electronically Signed   By: Garald Balding M.D.   On: 01/15/2015 10:02   Dg Chest Port 1 View  01/13/2015   CLINICAL DATA:  Chest pain.  Initial encounter.  EXAM: PORTABLE CHEST - 1 VIEW  COMPARISON:  12/28/2014 and 12/16/2014 radiographs.  FINDINGS: 1833 hour. Left subclavian AICD appears unchanged at the right ventricular apex. The heart size and mediastinal contours are stable with aortic arch atherosclerosis. The lungs are hyperinflated. Patchy bibasilar opacities are unchanged, likely atelectasis. There is minimal subpleural density laterally in the right mid lung which appears unchanged, probably scarring. Right cervical rib noted.  IMPRESSION: No change from recent prior studies. Probable bibasilar atelectasis.   Electronically Signed   By: Richardean Sale M.D.   On: 01/13/2015 18:58    EKG: Sinus rhythm  ASSESSMENT AND PLAN:   70 year old female with known coronary artery disease, status post multiple coronary stents, with recent non-ST elevation myocardial infarction, cardiac catheterization revealing diffuse in-stent restenosis in the mid left circumflex with fused proximal disease felt to be technically very challenging, high risk for PCI with probable minimal gain since left ventriculography revealed moderately reduced left ventricular function with inferior wall and tracheal aneurysm.  Recommendations  1. Continue current medications 2. Add long-acting nitrates 3. Continue heparin for 48-72 hour 4. The patient has breakthrough angina, consider adding Ranexa 5. Consider second opinion from [facility] where patients had previous  coronary stenting for possible technically challenging and high risk PCI. This could be done as outpatient if patient is stable enough for discharge.  SignedIsaias Cowman MD,PhD, [facility] 01/21/2015, 1:13 PM

## 2015-01-21 NOTE — Care Management (Signed)
Readmit with chest pain- same sx as last week.  Followed by Arville Go.  Requested patient now be followed under the [facility] program.  Informed Arville Go

## 2015-01-21 NOTE — Progress Notes (Signed)
[place] [facility] [place] NAME: Lynn Butler    MR#:  [id number]   DATE OF BIRTH:  Mar 23, 1946  SUBJECTIVE:  CHIEF COMPLAINT:   Chief Complaint  Patient presents with  . Chest Pain  . Shortness of Breath   Admitted for chest pain and sob which are chronic. Has chronic wheezing.  REVIEW OF SYSTEMS:  Review of Systems  Constitutional: Negative for fever, weight loss, malaise/fatigue and diaphoresis.  HENT: Negative for ear discharge, ear pain, hearing loss, nosebleeds, sore throat and tinnitus.   Eyes: Negative for blurred vision and pain.  Respiratory: Positive for shortness of breath and wheezing. Negative for cough and hemoptysis.   Cardiovascular: Negative for chest pain, palpitations (chest pressure), orthopnea and leg swelling.  Gastrointestinal: Positive for heartburn and abdominal pain. Negative for nausea, vomiting, diarrhea, constipation and blood in stool.       Does have chronic abdominal pain.  Genitourinary: Negative for dysuria, urgency and frequency.  Musculoskeletal: Positive for back pain. Negative for myalgias.  Skin: Negative for itching and rash.  Neurological: Negative for dizziness, tingling, tremors, focal weakness, seizures, weakness and headaches.  Psychiatric/Behavioral: Negative for depression. The patient is not nervous/anxious.    DRUG ALLERGIES:   Allergies  Allergen Reactions  . Nsaids Nausea And Vomiting and Other (See Comments)    Reaction:  GI distress and burning    VITALS:  Blood pressure 112/62, pulse 73, temperature 97.8 F (36.6 C), temperature source Oral, resp. rate 18, height 5\' 4"  (1.626 m), weight 46.04 kg (101 lb 8 oz), SpO2 100 %. PHYSICAL EXAMINATION:  Physical Exam  Constitutional: She is oriented to person, place, and time. She appears malnourished. She appears unhealthy. She appears cachectic.  HENT:  Head: Normocephalic and atraumatic.  Eyes: Conjunctivae and EOM are normal.  Pupils are equal, round, and reactive to light.  Neck: Normal range of motion. Neck supple. No tracheal deviation present. No thyromegaly present.  Cardiovascular: Normal rate, regular rhythm and normal heart sounds.   Pulmonary/Chest: Effort normal and breath sounds normal. No respiratory distress. She has no wheezes. She exhibits no tenderness.  Occasional scattered wheezes.  Abdominal: Soft. Bowel sounds are normal. She exhibits no distension. There is no tenderness.  Musculoskeletal: Normal range of motion.  Neurological: She is alert and oriented to person, place, and time. No cranial nerve deficit.  Skin: Skin is warm and dry. No rash noted.     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   Psychiatric: Mood and affect normal.   LABORATORY PANEL:   CBC  Recent Labs Lab 01/21/15 0614  WBC 13.0*  HGB 10.4*  HCT 35.9  PLT 357   ------------------------------------------------------------------------------------------------------------------ Chemistries   Recent Labs Lab 01/20/15 2043 01/21/15 0614  NA 144 144  K 3.8 4.5  CL 106 103  CO2 31 34*  GLUCOSE 91 84  BUN 25* 28*  CREATININE 0.67 0.93  CALCIUM 8.6* 7.8*  AST 21  --   ALT 27  --   ALKPHOS 70  --   BILITOT 0.3  --    RADIOLOGY:  Dg Chest 2 View  01/20/2015   CLINICAL DATA:  Chest pain and shortness of breath for 24 hours.  EXAM: CHEST  2 VIEW  COMPARISON:  Single view of the chest 01/15/2015.  FINDINGS: There is cardiomegaly without edema. No pneumothorax identified. Very small bilateral pleural effusions are seen. AICD is in place.  IMPRESSION: Cardiomegaly and very small bilateral pleural  effusions. Negative for pulmonary edema.   Electronically Signed   By: Inge Rise M.D.   On: 01/20/2015 21:24   ASSESSMENT AND PLAN:   * NSTEMI ASA, Plavix, Statin. Lovenox per cardiology recommendations. Had recent cath. Will need [facility] cardiology f/u as OP.  *  Anemia: Hemoglobin low but stable.    * Peripheral vascular disease with ulcer of the left lower extremity- patient is on Plavix. Ulcer is healing.   Follows with [facility] wound clinic.  * tobacco abuse smoking cessation counseling done  * Ulcerative lesion on left forehead. No recent increase in size. Following with [facility] dermatology. Continue to monitor. Or can follow up with the plastic surgeon. No inpatient needs.  * Chronic pain syndrome OP pai clinic f/u  All the records are reviewed and case discussed with Care Management/Social Worker. Management plans discussed with the patient, and she is in agreement.  CODE STATUS: full code  TOTAL TIME TAKING CARE OF THIS PATIENT: 35 minutes.   More than 50% of the time was spent in counseling/coordination of care: YES  POSSIBLE D/C TOMORROW, DEPENDING ON CLINICAL CONDITION.   Hillary Bow R M.D on 01/21/2015 at 1:39 PM  Between 7am to 6pm - Pager - [phone]  After 6pm go to [email or web address] - password [email or web address]  Office  [phone]  CC:  Primary care physician; Pcp Not In System

## 2015-01-21 NOTE — Consult Note (Addendum)
WOC wound consult note Reason for Consult:Consult requested for left leg wound.  Pt familiar to [facility] team from recent admission; refer to progress notes on 6/17 for wound assessment and measurements; pt was not assessed in person by [facility] during this visit.  Wound type: Full thickness chronic wound.  Discussed plan of care via phone with bedside nurse.   Dressing procedure/placement/frequency: Continue present plan of care: Cleanse left lower leg with NS and pat gently dry. Apply Mepitel silicone contact layer to wound bed. Cover with 4x4 gauze, kerlix and tape. Change Mon-Wed-Fri.  Please re-consult if further assistance is needed.  Thank-you,  Julien Girt MSN, [place], [place], [place], [place]

## 2015-01-21 NOTE — Progress Notes (Signed)
Wound placed wound care orders for MWF. Offered to change dressing but pt said "it is ok." Supplies placed in room if pt changes mind.

## 2015-01-22 MED ORDER — FUROSEMIDE 20 MG PO TABS: 20.0000 mg | ORAL_TABLET | Freq: Every day | ORAL | Status: DC

## 2015-01-22 MED ORDER — HYDROMORPHONE HCL 4 MG PO TABS: 4.0000 mg | ORAL_TABLET | Freq: Four times a day (QID) | ORAL | Status: DC | PRN

## 2015-01-22 MED ORDER — PREDNISONE 20 MG PO TABS: 40.0000 mg | ORAL_TABLET | Freq: Every day | ORAL | Status: DC

## 2015-01-22 MED ORDER — OXYCODONE HCL ER 80 MG PO T12A: 80.0000 mg | EXTENDED_RELEASE_TABLET | Freq: Two times a day (BID) | ORAL | Status: DC

## 2015-01-22 MED ORDER — LEVOFLOXACIN 250 MG PO TABS: 250.0000 mg | ORAL_TABLET | Freq: Every day | ORAL | Status: DC

## 2015-01-22 MED ORDER — ISOSORBIDE MONONITRATE ER 30 MG PO TB24: 30.0000 mg | ORAL_TABLET | Freq: Every day | ORAL | Status: DC

## 2015-01-22 NOTE — Progress Notes (Signed)
[facility]  SUBJECTIVE: I had chest pain when coughing   Filed Vitals:   01/21/15 1729 01/21/15 2024 01/22/15 0356 01/22/15 1132  BP: 107/69 106/59 109/56 126/57  Pulse: 75 92 81 94  Temp:  98.5 F (36.9 C) 98.1 F (36.7 C) 98.1 F (36.7 C)  TempSrc:  Oral Oral Oral  Resp:  20 18 17   Height:      Weight:   44.77 kg (98 lb 11.2 oz)   SpO2:  100% 100%      Intake/Output Summary (Last 24 hours) at 01/22/15 1135 Last data filed at 01/22/15 9379  Gross per 24 hour  Intake      3 ml  Output   3500 ml  Net  -3497 ml      PHYSICAL EXAM  General: Patient appears frail  HEENT:  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  Lungs: Clear bilaterally to auscultation and percussion. Heart: HRRR . Normal S1 and S2 without gallops or murmurs.  Abdomen: Bowel sounds are positive, abdomen soft and non-tender  Msk:  Back normal, normal gait. Normal strength and tone for age. Extremities: No clubbing, cyanosis or edema.   Neuro: Alert and oriented X 3. Psych:  Good affect, responds appropriately   LABS: Basic Metabolic Panel:  Recent Labs  01/20/15 2043 01/21/15 0614  NA 144 144  K 3.8 4.5  CL 106 103  CO2 31 34*  GLUCOSE 91 84  BUN 25* 28*  CREATININE 0.67 0.93  CALCIUM 8.6* 7.8*   Liver Function Tests:  Recent Labs  01/20/15 2043  AST 21  ALT 27  ALKPHOS 70  BILITOT 0.3  PROT 6.0*  ALBUMIN 3.0*   No results for input(s): LIPASE, AMYLASE in the last 72 hours. CBC:  Recent Labs  01/20/15 2043 01/21/15 0614  WBC 19.5* 13.0*  HGB 11.9* 10.4*  HCT 39.2 35.9  MCV 73.8* 74.5*  PLT 443* 357   Cardiac Enzymes:  Recent Labs  01/21/15 0039 01/21/15 0614 01/21/15 1204  TROPONINI 2.54* 2.45* 2.39*   BNP: Invalid input(s): POCBNP D-Dimer: No results for input(s): DDIMER in the last 72 hours. Hemoglobin A1C: No results for input(s): HGBA1C in the last 72 hours. Fasting Lipid Panel: No results for input(s): CHOL, HDL, LDLCALC, TRIG,  CHOLHDL, LDLDIRECT in the last 72 hours. Thyroid Function Tests: No results for input(s): TSH, T4TOTAL, T3FREE, THYROIDAB in the last 72 hours.  Invalid input(s): FREET3 Anemia Panel: No results for input(s): VITAMINB12, FOLATE, FERRITIN, TIBC, IRON, RETICCTPCT in the last 72 hours.  Dg Chest 2 View  01/20/2015   CLINICAL DATA:  Chest pain and shortness of breath for 24 hours.  EXAM: CHEST  2 VIEW  COMPARISON:  Single view of the chest 01/15/2015.  FINDINGS: There is cardiomegaly without edema. No pneumothorax identified. Very small bilateral pleural effusions are seen. AICD is in place.  IMPRESSION: Cardiomegaly and very small bilateral pleural effusions. Negative for pulmonary edema.   Electronically Signed   By: Inge Rise M.D.   On: 01/20/2015 21:24     Echo   TELEMETRY: Normal sinus rhythm:  ASSESSMENT AND PLAN:  Principal Problem:   Myocardial infarction in recovery phase Active Problems:   Chest pain   CAD (coronary artery disease)   COPD exacerbation   CHF (congestive heart failure)   Leg ulcer   HTN (hypertension)    [age]-year-old female, with recent non-ST elevation myocardial infarction, with catheterization revealing in-stent restenosis mid left circumflex, highly calcified  proximal stenosis in tortuous segment, likely very challenging for PCI, possible moderate to high risk with possible minimal reward. 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 Patient had chest pain, which sounded more pleuritic, exacerbated by cough.  Recommendations  1. DC Lovenox 2. Consider adding Plavix 3. Continue isosorrbide mononitrate. If patient has breakthrough angina, consider adding Ranexa. 4. Consider follow-up at [facility] where patient has had previous PCI's, to consider technically challenging and moderate risk PCI of proximal and mid left circumflex.   Isaias Cowman, MD, PhD,  [facility] 01/22/2015 11:35 AM

## 2015-01-22 NOTE — Care Management Note (Signed)
Case Management Note  Patient Details  Name: Lynn Butler MRN: [id number] Date of Birth: 12-29-1945  Subjective/Objective:                 A resume home health PT, RN, Aid order faxed and called to [facility] where [name] is an active client.    Action/Plan:   Expected Discharge Date:  01/22/15               Expected Discharge Plan:     In-House Referral:     Discharge planning Services     Post Acute Care Choice:    Choice offered to:     DME Arranged:    DME Agency:     HH Arranged:    [place] Agency:     Status of Service:     Medicare Important Message Given:  Yes Date Medicare IM Given:  01/21/15 Medicare IM give by:  Joni Reining Date Additional Medicare IM Given:    Additional Medicare Important Message give by:     If discussed at [place] of Stay Meetings, dates discussed:    Additional Comments:  Atara Paterson A, RN 01/22/2015, 10:23 AM

## 2015-01-22 NOTE — Progress Notes (Signed)
Resume previous Home Health orders with [facility]. V/O. Dr Alveta Heimlich / Braxton Feathers, RN, BSN

## 2015-01-22 NOTE — Discharge Instructions (Signed)
°  DIET:  Cardiac diet  DISCHARGE CONDITION:  Stable  ACTIVITY:  Activity as tolerated  OXYGEN:  Home Oxygen: Yes.     Oxygen Delivery: 3 liters/min via Patient connected to nasal cannula oxygen  DISCHARGE LOCATION:  home   If you experience worsening of your admission symptoms, develop shortness of breath, life threatening emergency, suicidal or homicidal thoughts you must seek medical attention immediately by calling 911 or calling your MD immediately  if symptoms less severe.  You Must read complete instructions/literature along with all the possible adverse reactions/side effects for all the Medicines you take and that have been prescribed to you. Take any new Medicines after you have completely understood and accpet all the possible adverse reactions/side effects.   Please note  You were cared for by a hospitalist during your hospital stay. If you have any questions about your discharge medications or the care you received while you were in the hospital after you are discharged, you can call the unit and asked to speak with the hospitalist on call if the hospitalist that took care of you is not available. Once you are discharged, your primary care physician will handle any further medical issues. Please note that NO REFILLS for any discharge medications will be authorized once you are discharged, as it is imperative that you return to your primary care physician (or establish a relationship with a primary care physician if you do not have one) for your aftercare needs so that they can reassess your need for medications and monitor your lab values.    QUIT SMOKING

## 2015-01-22 NOTE — Progress Notes (Signed)
Pt discharged home with instructions to f/u w/ [facility] and [facility], pt verbalized understanding, pt given extensive ed re COPD, angina, smoking, and her meds.  Pt signed up for emmi transitions also. Left hospital with her daughter

## 2015-01-24 NOTE — Discharge Summary (Signed)
[facility] NAME: Lynn Butler    MR#:  [id number]  DATE OF BIRTH:  28-Mar-1946  DATE OF ADMISSION:  01/20/2015 ADMITTING PHYSICIAN: Lance Coon, MD  DATE OF DISCHARGE: 01/22/2015 12:45 PM  PRIMARY CARE PHYSICIAN: Pcp Not In System    ADMISSION DIAGNOSIS:  SOB (shortness of breath) [R06.02] NSTEMI (non-ST elevated myocardial infarction) [I21.4] Chest pain, unspecified chest pain type [R07.9] Chronic obstructive pulmonary disease, unspecified COPD, unspecified chronic bronchitis type [J44.9]  DISCHARGE DIAGNOSIS:  Principal Problem:   Myocardial infarction in recovery phase Active Problems:   CAD (coronary artery disease)   CHF (congestive heart failure)   Leg ulcer   COPD exacerbation   Chest pain   HTN (hypertension)   SECONDARY DIAGNOSIS:   Past Medical History  Diagnosis Date  . COPD (chronic obstructive pulmonary disease)   . Hypertension   . Ischemic cardiomyopathy     a.   . Coronary artery disease     a. s/p multiple stenting in 2005  . History of ventricular tachycardia   . PVD (peripheral vascular disease)   . PAD (peripheral artery disease)   . HLD (hyperlipidemia)   . Chronic back pain   . MI, old   . MRSA (methicillin resistant staph aureus) culture positive     left foot wound  . CHF (congestive heart failure)      ADMITTING HISTORY  Lynn Butler is a 69 y.o. female who presents with worsening chest pain. Patient has been here frequently over the last couple of months with multiple MI. During her last admission a cardiac catheterization was performed and found completely occluded stent as well as severe disease in multiple vessels. She was told her only option was medical management. She returns today with progressively worsening chest pain in the setting of a resolving MI, as well as significant wheezing and shortness of breath likely related to COPD exacerbation. Hospitalists were  called for admission for the same.   HOSPITAL COURSE:   * NSTEMI ASA, Plavix, Statin. Lovenox per cardiology recommendations continued for 48 hours. Followed by Dr. Tomasita Crumble shows during the hospital stay. Had recent cath which showed distal RCA stenosis which was not amenable to PCI. This was thought to need only medical management per cardiology. Will need [facility] cardiology f/u as OP. Although patient had elevated troponin of 2.5 this could have been secondary to the recent and STEMI patient had and was trending down. Her chest pain was pleuritic and very atypical. Also patient requests high dose of pain medications constantly.  * Anemia: Hemoglobin low but stable.   * Peripheral vascular disease with ulcer of the left lower extremity- patient is on Plavix. Ulcer is healing.  Follows with [facility] wound clinic.  * tobacco abuse smoking cessation counseling done  * Ulcerative lesion on left forehead. No recent increase in size. Following with [facility] dermatology. Continue to monitor. Or can follow up with the plastic surgeon. No inpatient needs.  * End-stage COPD. Patient has chronic wheezing and is on 3-4 L of oxygen at home.  * Chronic pain syndrome OP pain clinic f/u  Patient was given pain medication prescriptions for 5 days until she can see her doctor at the pain clinic.  She was chest pain-free by the day of discharge.  If patient returns to the emergency room and is deemed to need further investigation for CAD, she will need to be transferred to a tertiary care center. Preferably [facility] where  she has been seen before by cardiology.   CONSULTS OBTAINED:  Treatment Team:  Isaias Cowman, MD  DRUG ALLERGIES:   Allergies  Allergen Reactions  . Nsaids Nausea And Vomiting and Other (See Comments)    Reaction:  GI distress and burning     DISCHARGE MEDICATIONS:   Discharge Medication List as of 01/22/2015 10:41 AM    START taking these medications   Details  isosorbide  mononitrate (IMDUR) 30 MG 24 hr tablet Take 1 tablet (30 mg total) by mouth daily., Starting 01/22/2015, Until Discontinued, Normal    levofloxacin (LEVAQUIN) 250 MG tablet Take 1 tablet (250 mg total) by mouth daily., Starting 01/22/2015, Until Discontinued, Normal    predniSONE (DELTASONE) 20 MG tablet Take 2 tablets (40 mg total) by mouth daily with breakfast., Starting 01/22/2015, Until Discontinued, Normal      CONTINUE these medications which have CHANGED   Details  furosemide (LASIX) 20 MG tablet Take 1 tablet (20 mg total) by mouth daily., Starting 01/22/2015, Until Discontinued, Normal    HYDROmorphone (DILAUDID) 4 MG tablet Take 1 tablet (4 mg total) by mouth every 6 (six) hours as needed for severe pain., Starting 01/22/2015, Until Discontinued, Print    OxyCODONE (OXYCONTIN) 80 mg T12A 12 hr tablet Take 1 tablet (80 mg total) by mouth every 12 (twelve) hours., Starting 01/22/2015, Until Discontinued, Print      CONTINUE these medications which have NOT CHANGED   Details  albuterol (PROAIR HFA) 108 (90 BASE) MCG/ACT inhaler Inhale 1-2 puffs into the lungs every 4 (four) hours as needed for wheezing or shortness of breath., Starting 07/28/2014, Until Discontinued, No Print    clopidogrel (PLAVIX) 75 MG tablet Take 75 mg by mouth daily., Until Discontinued, Historical Med    Fluticasone Furoate-Vilanterol (BREO ELLIPTA) 100-25 MCG/INH AEPB Inhale 1 puff into the lungs daily., Until Discontinued, Historical Med    nitroGLYCERIN (NITROSTAT) 0.4 MG SL tablet Place 0.4 mg under the tongue every 5 (five) minutes as needed for chest pain., Until Discontinued, Historical Med    traMADol (ULTRAM) 50 MG tablet Take 1 tablet (50 mg total) by mouth every 6 (six) hours as needed., Starting 01/09/2015, Until Discontinued, Normal    albuterol (PROVENTIL) (2.5 MG/3ML) 0.083% nebulizer solution Take 3 mLs (2.5 mg total) by nebulization every 6 (six) hours as needed for wheezing or shortness of breath.,  Starting 01/17/2015, Until Discontinued, Print    atorvastatin (LIPITOR) 40 MG tablet Take 1 tablet (40 mg total) by mouth daily at 6 PM., Starting 01/17/2015, Until Discontinued, Print    benzonatate (TESSALON) 100 MG capsule Take 1 capsule (100 mg total) by mouth 3 (three) times daily., Starting 01/17/2015, Until Discontinued, Print    carvedilol (COREG) 3.125 MG tablet Take 1 tablet (3.125 mg total) by mouth 2 (two) times daily with a meal., Starting 01/17/2015, Until Discontinued, Print    Chlorhexidine Gluconate Cloth 2 % PADS Apply 6 each topically daily at 6 (six) AM., Starting 12/30/2014, Until Discontinued, Normal    chlorpheniramine-HYDROcodone (TUSSIONEX) 10-8 MG/5ML SUER Take 10 mLs by mouth every 12 (twelve) hours., Starting 01/17/2015, Until Discontinued, Print    collagenase (SANTYL) ointment Apply topically daily., Starting 12/19/2014, Until Discontinued, Normal    diazepam (VALIUM) 10 MG tablet Take 1 tablet (10 mg total) by mouth every 12 (twelve) hours as needed for anxiety., Starting 01/17/2015, Until Discontinued, Print    Fluticasone-Salmeterol (ADVAIR) 250-50 MCG/DOSE AEPB Inhale 1 puff into the lungs 2 (two) times daily., Until Discontinued, Historical Med  gabapentin (NEURONTIN) 300 MG capsule 1 tab po at bedtime 1st day, 1 tablet bid second day, then 1 tablet tid, Print    LORazepam (ATIVAN) 1 MG tablet Take 1 tablet (1 mg total) by mouth every 8 (eight) hours as needed for anxiety., Starting 01/17/2015, Until Discontinued, Print    nicotine (NICODERM CQ - DOSED IN MG/24 HOURS) 21 mg/24hr patch Place 1 patch (21 mg total) onto the skin daily., Starting 01/17/2015, Until Discontinued, Print    predniSONE (STERAPRED UNI-PAK 21 TAB) 10 MG (21) TBPK tablet Take 1 tablet (10 mg total) by mouth daily. 6 tabs PO x 1 day 5 tabs PO x 1 day 4 tabs PO x 1 day 3 tabs PO x 1 day 2 tabs PO x 1 day 1 tab PO x 1 day and stop, Starting 01/17/2015, Until Discontinued, Print    zolpidem  (AMBIEN) 10 MG tablet Take 1 tablet (10 mg total) by mouth at bedtime as needed for sleep., Starting 01/17/2015, Until Discontinued, Print      STOP taking these medications     amLODipine (NORVASC) 5 MG tablet        Today    VITAL SIGNS:  Blood pressure 126/57, pulse 94, temperature 98.1 F (36.7 C), temperature source Oral, resp. rate 17, height 5\' 4"  (1.626 m), weight 44.77 kg (98 lb 11.2 oz), SpO2 98 %.  I/O:  No intake or output data in the 24 hours ending 01/24/15 2333  PHYSICAL EXAMINATION:  Physical Exam  GENERAL:  69 y.o.-year-old patient lying in the bed with no acute distress.  LUNGS: Mild wheezing. Normal air entry. Normal work of breathing CARDIOVASCULAR: S1, S2 normal. No murmurs, rubs, or gallops. Tender chest wall ABDOMEN: Soft, non-tender, non-distended. Bowel sounds present. No organomegaly or mass.  NEUROLOGIC: Moves all 4 extremities. PSYCHIATRIC: The patient is alert and oriented x 3.  SKIN: Forehead ulcer, chronic. No discharge or bleeding  DATA REVIEW:   CBC  Recent Labs Lab 01/21/15 0614  WBC 13.0*  HGB 10.4*  HCT 35.9  PLT 357    Chemistries   Recent Labs Lab 01/20/15 2043 01/21/15 0614  NA 144 144  K 3.8 4.5  CL 106 103  CO2 31 34*  GLUCOSE 91 84  BUN 25* 28*  CREATININE 0.67 0.93  CALCIUM 8.6* 7.8*  AST 21  --   ALT 27  --   ALKPHOS 70  --   BILITOT 0.3  --     Cardiac Enzymes  Recent Labs Lab 01/21/15 1204  TROPONINI 2.39*    Microbiology Results  Results for orders placed or performed during the hospital encounter of 01/13/15  Culture, blood (routine x 2)     Status: None   Collection Time: 01/13/15  6:30 PM  Result Value Ref Range Status   Specimen Description BLOOD  Final   Special Requests BLOOD  Final   Culture NO GROWTH 5 DAYS  Final   Report Status 01/18/2015 FINAL  Final  Culture, blood (routine x 2)     Status: None   Collection Time: 01/13/15  8:30 PM  Result Value Ref Range Status   Specimen  Description BLOOD  Final   Special Requests NONE  Final   Culture NO GROWTH 5 DAYS  Final   Report Status 01/18/2015 FINAL  Final    RADIOLOGY:  No results found.    Follow up with PCP in 1 week.  Management plans discussed with the patient, family and they are in agreement.  CODE STATUS:   TOTAL TIME TAKING CARE OF THIS PATIENT ON DAY OF DISCHARGE: more than 30  minutes.    Hillary Bow R M.D on 01/24/2015 at 11:33 PM  Between 7am to 6pm - Pager - [phone]  After 6pm go to [email or web address] - password [email or web address] [facility]  Office  [phone]  CC: Primary care physician; Pcp Not In System

## 2015-02-01 ENCOUNTER — Observation Stay
Admission: EM | Admit: 2015-02-01 | Discharge: 2015-02-03 | Disposition: A | Discharge status: Home / Self Care | Payer: Medicare Other | Source: Home / Self Care | Attending: Emergency Medicine | Admitting: Emergency Medicine

## 2015-02-01 ENCOUNTER — Observation Stay (HOSPITAL_BASED_OUTPATIENT_CLINIC_OR_DEPARTMENT_OTHER)
Admit: 2015-02-01 | Discharge: 2015-02-01 | Disposition: A | Discharge status: Home / Self Care | Payer: Medicare Other | Attending: Internal Medicine | Admitting: Internal Medicine

## 2015-02-01 ENCOUNTER — Emergency Department: Payer: Medicare Other

## 2015-02-01 DIAGNOSIS — E785 Hyperlipidemia, unspecified: Secondary | ICD-10-CM | POA: Diagnosis present

## 2015-02-01 DIAGNOSIS — I10 Essential (primary) hypertension: Secondary | ICD-10-CM | POA: Insufficient documentation

## 2015-02-01 DIAGNOSIS — I5042 Chronic combined systolic (congestive) and diastolic (congestive) heart failure: Secondary | ICD-10-CM | POA: Insufficient documentation

## 2015-02-01 DIAGNOSIS — R079 Chest pain, unspecified: Secondary | ICD-10-CM | POA: Diagnosis present

## 2015-02-01 DIAGNOSIS — Z95810 Presence of automatic (implantable) cardiac defibrillator: Secondary | ICD-10-CM

## 2015-02-01 DIAGNOSIS — I25119 Atherosclerotic heart disease of native coronary artery with unspecified angina pectoris: Secondary | ICD-10-CM

## 2015-02-01 DIAGNOSIS — I252 Old myocardial infarction: Secondary | ICD-10-CM

## 2015-02-01 DIAGNOSIS — I739 Peripheral vascular disease, unspecified: Secondary | ICD-10-CM | POA: Diagnosis present

## 2015-02-01 DIAGNOSIS — I255 Ischemic cardiomyopathy: Secondary | ICD-10-CM | POA: Insufficient documentation

## 2015-02-01 DIAGNOSIS — I509 Heart failure, unspecified: Secondary | ICD-10-CM

## 2015-02-01 DIAGNOSIS — F17210 Nicotine dependence, cigarettes, uncomplicated: Secondary | ICD-10-CM | POA: Diagnosis present

## 2015-02-01 DIAGNOSIS — R0602 Shortness of breath: Secondary | ICD-10-CM | POA: Insufficient documentation

## 2015-02-01 DIAGNOSIS — Z8614 Personal history of Methicillin resistant Staphylococcus aureus infection: Secondary | ICD-10-CM

## 2015-02-01 DIAGNOSIS — E43 Unspecified severe protein-calorie malnutrition: Secondary | ICD-10-CM | POA: Diagnosis present

## 2015-02-01 DIAGNOSIS — J90 Pleural effusion, not elsewhere classified: Secondary | ICD-10-CM

## 2015-02-01 DIAGNOSIS — A419 Sepsis, unspecified organism: Principal | ICD-10-CM | POA: Diagnosis present

## 2015-02-01 DIAGNOSIS — M8580 Other specified disorders of bone density and structure, unspecified site: Secondary | ICD-10-CM | POA: Insufficient documentation

## 2015-02-01 DIAGNOSIS — J441 Chronic obstructive pulmonary disease with (acute) exacerbation: Secondary | ICD-10-CM | POA: Insufficient documentation

## 2015-02-01 DIAGNOSIS — J9611 Chronic respiratory failure with hypoxia: Secondary | ICD-10-CM | POA: Insufficient documentation

## 2015-02-01 DIAGNOSIS — Z9981 Dependence on supplemental oxygen: Secondary | ICD-10-CM

## 2015-02-01 DIAGNOSIS — R5383 Other fatigue: Secondary | ICD-10-CM

## 2015-02-01 DIAGNOSIS — M25551 Pain in right hip: Secondary | ICD-10-CM

## 2015-02-01 DIAGNOSIS — I219 Acute myocardial infarction, unspecified: Secondary | ICD-10-CM | POA: Diagnosis present

## 2015-02-01 DIAGNOSIS — I340 Nonrheumatic mitral (valve) insufficiency: Secondary | ICD-10-CM

## 2015-02-01 DIAGNOSIS — I2510 Atherosclerotic heart disease of native coronary artery without angina pectoris: Secondary | ICD-10-CM | POA: Diagnosis present

## 2015-02-01 DIAGNOSIS — M549 Dorsalgia, unspecified: Secondary | ICD-10-CM | POA: Insufficient documentation

## 2015-02-01 DIAGNOSIS — Z681 Body mass index (BMI) 19 or less, adult: Secondary | ICD-10-CM

## 2015-02-01 DIAGNOSIS — Z79891 Long term (current) use of opiate analgesic: Secondary | ICD-10-CM

## 2015-02-01 DIAGNOSIS — Z955 Presence of coronary angioplasty implant and graft: Secondary | ICD-10-CM

## 2015-02-01 DIAGNOSIS — Z8249 Family history of ischemic heart disease and other diseases of the circulatory system: Secondary | ICD-10-CM

## 2015-02-01 DIAGNOSIS — R05 Cough: Secondary | ICD-10-CM

## 2015-02-01 DIAGNOSIS — D649 Anemia, unspecified: Secondary | ICD-10-CM

## 2015-02-01 DIAGNOSIS — J449 Chronic obstructive pulmonary disease, unspecified: Secondary | ICD-10-CM | POA: Diagnosis present

## 2015-02-01 DIAGNOSIS — Z803 Family history of malignant neoplasm of breast: Secondary | ICD-10-CM

## 2015-02-01 DIAGNOSIS — R55 Syncope and collapse: Secondary | ICD-10-CM | POA: Diagnosis present

## 2015-02-01 DIAGNOSIS — F17200 Nicotine dependence, unspecified, uncomplicated: Secondary | ICD-10-CM

## 2015-02-01 DIAGNOSIS — G8929 Other chronic pain: Secondary | ICD-10-CM

## 2015-02-01 DIAGNOSIS — J189 Pneumonia, unspecified organism: Secondary | ICD-10-CM | POA: Diagnosis present

## 2015-02-01 DIAGNOSIS — C4490 Unspecified malignant neoplasm of skin, unspecified: Secondary | ICD-10-CM | POA: Diagnosis present

## 2015-02-01 DIAGNOSIS — Z7902 Long term (current) use of antithrombotics/antiplatelets: Secondary | ICD-10-CM

## 2015-02-01 LAB — CBC
HCT: 39.9 % (ref 35.0–47.0)
Hemoglobin: 12 g/dL (ref 12.0–16.0)
MCH: 21.5 pg — AB (ref 26.0–34.0)
MCHC: 30 g/dL — ABNORMAL LOW (ref 32.0–36.0)
MCV: 71.7 fL — ABNORMAL LOW (ref 80.0–100.0)
PLATELETS: 236 10*3/uL (ref 150–440)
RBC: 5.57 MIL/uL — ABNORMAL HIGH (ref 3.80–5.20)
RDW: 19 % — AB (ref 11.5–14.5)
WBC: 13.6 10*3/uL — ABNORMAL HIGH (ref 3.6–11.0)

## 2015-02-01 LAB — COMPREHENSIVE METABOLIC PANEL
ALT: 23 U/L (ref 14–54)
AST: 19 U/L (ref 15–41)
Albumin: 3.4 g/dL — ABNORMAL LOW (ref 3.5–5.0)
Alkaline Phosphatase: 74 U/L (ref 38–126)
Anion gap: 9 (ref 5–15)
BUN: 11 mg/dL (ref 6–20)
CALCIUM: 8.6 mg/dL — AB (ref 8.9–10.3)
CHLORIDE: 105 mmol/L (ref 101–111)
CO2: 28 mmol/L (ref 22–32)
CREATININE: 0.68 mg/dL (ref 0.44–1.00)
GFR calc non Af Amer: >60 mL/min (ref >60)
GLUCOSE: 111 mg/dL — AB (ref 65–99)
Potassium: 3 mmol/L — ABNORMAL LOW (ref 3.5–5.1)
Sodium: 142 mmol/L (ref 135–145)
Total Bilirubin: 0.3 mg/dL (ref 0.3–1.2)
Total Protein: 6.4 g/dL — ABNORMAL LOW (ref 6.5–8.1)

## 2015-02-01 LAB — BRAIN NATRIURETIC PEPTIDE: B Natriuretic Peptide: 1345 pg/mL — ABNORMAL HIGH (ref 0.0–100.0)

## 2015-02-01 MED ORDER — ONDANSETRON HCL 4 MG/2ML IJ SOLN
4.0000 mg | INTRAMUSCULAR | Status: AC
  Administered 2015-02-01: 4 mg via INTRAVENOUS

## 2015-02-01 MED ORDER — CLOPIDOGREL BISULFATE 75 MG PO TABS
75.0000 mg | ORAL_TABLET | Freq: Every day | ORAL | Status: DC
  Administered 2015-02-02 – 2015-02-03 (×2): 75 mg via ORAL
  Filled 2015-02-01 (×2): qty 1

## 2015-02-01 MED ORDER — POTASSIUM CHLORIDE CRYS ER 20 MEQ PO TBCR
40.0000 meq | EXTENDED_RELEASE_TABLET | ORAL | Status: AC
  Administered 2015-02-01: 40 meq via ORAL

## 2015-02-01 MED ORDER — MORPHINE SULFATE 4 MG/ML IJ SOLN
INTRAMUSCULAR | Status: AC
  Administered 2015-02-01: 6 mg via INTRAVENOUS
  Filled 2015-02-01: qty 1

## 2015-02-01 MED ORDER — SODIUM CHLORIDE 0.9 % IJ SOLN
3.0000 mL | Freq: Two times a day (BID) | INTRAMUSCULAR | Status: DC
  Administered 2015-02-01 – 2015-02-03 (×4): 3 mL via INTRAVENOUS

## 2015-02-01 MED ORDER — POTASSIUM CHLORIDE CRYS ER 20 MEQ PO TBCR
EXTENDED_RELEASE_TABLET | ORAL | Status: AC
  Administered 2015-02-01: 40 meq via ORAL
  Filled 2015-02-01: qty 2

## 2015-02-01 MED ORDER — CARVEDILOL 3.125 MG PO TABS
3.1250 mg | ORAL_TABLET | Freq: Two times a day (BID) | ORAL | Status: DC
  Administered 2015-02-02 – 2015-02-03 (×3): 3.125 mg via ORAL
  Filled 2015-02-01 (×3): qty 1

## 2015-02-01 MED ORDER — MORPHINE SULFATE 4 MG/ML IJ SOLN
4.0000 mg | Freq: Once | INTRAMUSCULAR | Status: AC
  Administered 2015-02-01: 4 mg via INTRAVENOUS

## 2015-02-01 MED ORDER — OXYCODONE HCL ER 40 MG PO T12A
80.0000 mg | EXTENDED_RELEASE_TABLET | Freq: Two times a day (BID) | ORAL | Status: DC
  Administered 2015-02-01 – 2015-02-03 (×4): 80 mg via ORAL
  Filled 2015-02-01 (×4): qty 2

## 2015-02-01 MED ORDER — HYDROMORPHONE HCL 2 MG PO TABS
4.0000 mg | ORAL_TABLET | Freq: Four times a day (QID) | ORAL | Status: DC | PRN
  Administered 2015-02-01 – 2015-02-02 (×2): 4 mg via ORAL
  Filled 2015-02-01 (×2): qty 2

## 2015-02-01 MED ORDER — IPRATROPIUM-ALBUTEROL 0.5-2.5 (3) MG/3ML IN SOLN: 3.0000 mL | RESPIRATORY_TRACT | Status: DC | PRN

## 2015-02-01 MED ORDER — ISOSORBIDE MONONITRATE ER 30 MG PO TB24
30.0000 mg | ORAL_TABLET | Freq: Every day | ORAL | Status: DC
  Administered 2015-02-02 – 2015-02-03 (×2): 30 mg via ORAL
  Filled 2015-02-01 (×2): qty 1

## 2015-02-01 MED ORDER — LEVOFLOXACIN IN D5W 250 MG/50ML IV SOLN
250.0000 mg | INTRAVENOUS | Status: DC
  Filled 2015-02-01: qty 50

## 2015-02-01 MED ORDER — LEVOFLOXACIN IN D5W 500 MG/100ML IV SOLN
500.0000 mg | Freq: Once | INTRAVENOUS | Status: AC
  Administered 2015-02-01: 500 mg via INTRAVENOUS
  Filled 2015-02-01: qty 100

## 2015-02-01 MED ORDER — CETYLPYRIDINIUM CHLORIDE 0.05 % MT LIQD
7.0000 mL | Freq: Two times a day (BID) | OROMUCOSAL | Status: DC
  Administered 2015-02-01 – 2015-02-03 (×4): 7 mL via OROMUCOSAL

## 2015-02-01 MED ORDER — MORPHINE SULFATE 4 MG/ML IJ SOLN
6.0000 mg | Freq: Once | INTRAMUSCULAR | Status: AC
  Administered 2015-02-01: 6 mg via INTRAVENOUS

## 2015-02-01 MED ORDER — ACETAMINOPHEN 325 MG PO TABS
650.0000 mg | ORAL_TABLET | Freq: Four times a day (QID) | ORAL | Status: DC | PRN
  Filled 2015-02-01: qty 2

## 2015-02-01 MED ORDER — MORPHINE SULFATE 4 MG/ML IJ SOLN
INTRAMUSCULAR | Status: AC
  Administered 2015-02-01: 4 mg via INTRAVENOUS
  Filled 2015-02-01: qty 1

## 2015-02-01 MED ORDER — AMLODIPINE BESYLATE 5 MG PO TABS
5.0000 mg | ORAL_TABLET | Freq: Every day | ORAL | Status: DC
  Administered 2015-02-02 – 2015-02-03 (×2): 5 mg via ORAL
  Filled 2015-02-01 (×2): qty 1

## 2015-02-01 MED ORDER — METHYLPREDNISOLONE SODIUM SUCC 125 MG IJ SOLR
125.0000 mg | INTRAMUSCULAR | Status: AC
  Administered 2015-02-01: 125 mg via INTRAVENOUS

## 2015-02-01 MED ORDER — FUROSEMIDE 20 MG PO TABS
20.0000 mg | ORAL_TABLET | Freq: Every day | ORAL | Status: DC
  Administered 2015-02-02 – 2015-02-03 (×2): 20 mg via ORAL
  Filled 2015-02-01 (×2): qty 1

## 2015-02-01 MED ORDER — ZOLPIDEM TARTRATE 5 MG PO TABS
5.0000 mg | ORAL_TABLET | Freq: Every evening | ORAL | Status: DC | PRN
  Administered 2015-02-01 – 2015-02-02 (×2): 5 mg via ORAL
  Filled 2015-02-01 (×2): qty 1

## 2015-02-01 MED ORDER — MORPHINE SULFATE 2 MG/ML IJ SOLN
INTRAMUSCULAR | Status: AC
  Filled 2015-02-01: qty 1

## 2015-02-01 MED ORDER — METHYLPREDNISOLONE SODIUM SUCC 125 MG IJ SOLR
INTRAMUSCULAR | Status: AC
  Administered 2015-02-01: 125 mg via INTRAVENOUS
  Filled 2015-02-01: qty 2

## 2015-02-01 MED ORDER — GABAPENTIN 300 MG PO CAPS
300.0000 mg | ORAL_CAPSULE | Freq: Every day | ORAL | Status: DC
  Administered 2015-02-01 – 2015-02-02 (×2): 300 mg via ORAL
  Filled 2015-02-01 (×2): qty 1

## 2015-02-01 MED ORDER — DIAZEPAM 5 MG PO TABS
10.0000 mg | ORAL_TABLET | Freq: Two times a day (BID) | ORAL | Status: DC | PRN
  Administered 2015-02-02 – 2015-02-03 (×3): 10 mg via ORAL
  Filled 2015-02-01 (×3): qty 2

## 2015-02-01 MED ORDER — ONDANSETRON HCL 4 MG/2ML IJ SOLN
INTRAMUSCULAR | Status: AC
  Administered 2015-02-01: 4 mg via INTRAVENOUS
  Filled 2015-02-01: qty 2

## 2015-02-01 MED ORDER — NITROGLYCERIN 0.4 MG SL SUBL: 0.4000 mg | SUBLINGUAL_TABLET | SUBLINGUAL | Status: DC | PRN

## 2015-02-01 MED ORDER — METHYLPREDNISOLONE SODIUM SUCC 125 MG IJ SOLR
60.0000 mg | Freq: Two times a day (BID) | INTRAMUSCULAR | Status: DC
  Administered 2015-02-02: 60 mg via INTRAVENOUS
  Filled 2015-02-01: qty 2

## 2015-02-01 MED ORDER — ATORVASTATIN CALCIUM 20 MG PO TABS
40.0000 mg | ORAL_TABLET | Freq: Every day | ORAL | Status: DC
  Administered 2015-02-02: 40 mg via ORAL
  Filled 2015-02-01: qty 2

## 2015-02-01 MED ORDER — MOMETASONE FURO-FORMOTEROL FUM 100-5 MCG/ACT IN AERO
2.0000 | INHALATION_SPRAY | Freq: Two times a day (BID) | RESPIRATORY_TRACT | Status: DC
  Administered 2015-02-01 – 2015-02-03 (×4): 2 via RESPIRATORY_TRACT
  Filled 2015-02-01: qty 8.8

## 2015-02-01 MED ORDER — IPRATROPIUM-ALBUTEROL 0.5-2.5 (3) MG/3ML IN SOLN
3.0000 mL | Freq: Once | RESPIRATORY_TRACT | Status: AC
  Administered 2015-02-01: 3 mL via RESPIRATORY_TRACT

## 2015-02-01 MED ORDER — ACETAMINOPHEN 650 MG RE SUPP: 650.0000 mg | Freq: Four times a day (QID) | RECTAL | Status: DC | PRN

## 2015-02-01 MED ORDER — ENOXAPARIN SODIUM 40 MG/0.4ML ~~LOC~~ SOLN
40.0000 mg | SUBCUTANEOUS | Status: DC
  Administered 2015-02-01: 40 mg via SUBCUTANEOUS
  Filled 2015-02-01: qty 0.4

## 2015-02-01 MED ORDER — IPRATROPIUM-ALBUTEROL 0.5-2.5 (3) MG/3ML IN SOLN
RESPIRATORY_TRACT | Status: AC
  Administered 2015-02-01: 3 mL via RESPIRATORY_TRACT
  Filled 2015-02-01: qty 3

## 2015-02-01 NOTE — ED Notes (Signed)
Pt states has been having n/v/d for 3 daysand also has had some chest pain, had syncope episode today

## 2015-02-01 NOTE — Care Management (Signed)
Medicare obs letter given and reviewed with patient.  Signed copy sent to medical records

## 2015-02-01 NOTE — ED Provider Notes (Signed)
[facility] Emergency Department Provider Note  ____________________________________________  Time seen: Approximately 3:08 PM  I have reviewed the triage vital signs and the nursing notes.   HISTORY  Chief Complaint Chest Pain and Loss of Consciousness    HPI Lynn Butler is a 69 y.o. female recently admitted within nSTEMI. Presents today with an episode of passing out. Patient states that she stood to walk across the house, and awoke on the floor. This was witnessed by her ex-husband who states that she passed out for about a minute. She denies any injury thereafter, but does report that she knew where she was when she woke up.  In addition, patient states she's been having some brief feelings of chest pain which describes as a sharp to tight pain across her chest off and on since the time of passing out. At present she does not have any pain in her chest, but does have pain over her abdomen. She does have a history of long-standing pain, and states she is not in any new pain at this time.  Medical history is reviewed and certainly notable for history of ventricular tachycardia in the setting of chest pain with syncope. She has a long history of coronary disease and COPD. Currently on prednisone.   Past Medical History  Diagnosis Date  . COPD (chronic obstructive pulmonary disease)   . Hypertension   . Ischemic cardiomyopathy     a.   . Coronary artery disease     a. s/p multiple stenting in 2005  . History of ventricular tachycardia   . PVD (peripheral vascular disease)   . PAD (peripheral artery disease)   . HLD (hyperlipidemia)   . Chronic back pain   . MI, old   . MRSA (methicillin resistant staph aureus) culture positive     left foot wound  . CHF (congestive heart failure)     Patient Active Problem List   Diagnosis Date Noted  . Myocardial infarction in recovery phase 01/20/2015  . HTN (hypertension) 01/20/2015  . Acute MI 01/13/2015   . Chest pain 12/28/2014  . COPD exacerbation 12/16/2014  . Protein-calorie malnutrition, severe 07/22/2014  . Acute on chronic respiratory failure with hypoxemia 07/21/2014  . COPD mixed type 07/21/2014  . CAD (coronary artery disease) 07/21/2014  . CHF (congestive heart failure) 07/21/2014  . PVD (peripheral vascular disease) 07/21/2014  . Leg ulcer 07/21/2014  . Skin lesion of face 07/21/2014  . Adult failure to thrive 07/21/2014  . Claudication of both lower extremities 07/21/2014  . Smoker 07/21/2014    Past Surgical History  Procedure Laterality Date  . Insert / replace / remove pacemaker    . Vascular bypass surgery Bilateral   . Cardiac catheterization N/A 01/14/2015    Procedure: Left Heart Cath;  Surgeon: Wellington Hampshire, MD;  Location: [facility];  Service: Cardiovascular;  Laterality: N/A;  . Cardiac defibrillator placement    . Appendectomy      Current Outpatient Rx  Name  Route  Sig  Dispense  Refill  . albuterol (PROAIR HFA) 108 (90 BASE) MCG/ACT inhaler   Inhalation   Inhale 1-2 puffs into the lungs every 4 (four) hours as needed for wheezing or shortness of breath.         Marland Kitchen albuterol (PROVENTIL) (2.5 MG/3ML) 0.083% nebulizer solution   Nebulization   Take 3 mLs (2.5 mg total) by nebulization every 6 (six) hours as needed for wheezing or shortness of breath.  75 mL   0   . amLODipine (NORVASC) 5 MG tablet   Oral   Take 5 mg by mouth daily.         Marland Kitchen atorvastatin (LIPITOR) 40 MG tablet   Oral   Take 1 tablet (40 mg total) by mouth daily at 6 PM.   30 tablet   0   . carvedilol (COREG) 3.125 MG tablet   Oral   Take 1 tablet (3.125 mg total) by mouth 2 (two) times daily with a meal.   60 tablet   2   . clopidogrel (PLAVIX) 75 MG tablet   Oral   Take 75 mg by mouth daily.         . collagenase (SANTYL) ointment   Topical   Apply topically daily.   15 g   0   . diazepam (VALIUM) 10 MG tablet   Oral   Take 1 tablet (10 mg  total) by mouth every 12 (twelve) hours as needed for anxiety.   20 tablet   0   . Fluticasone Furoate-Vilanterol (BREO ELLIPTA) 100-25 MCG/INH AEPB   Inhalation   Inhale 1 puff into the lungs daily.         . Fluticasone-Salmeterol (ADVAIR) 250-50 MCG/DOSE AEPB   Inhalation   Inhale 1 puff into the lungs 2 (two) times daily.         . furosemide (LASIX) 20 MG tablet   Oral   Take 1 tablet (20 mg total) by mouth daily.   30 tablet   0   . gabapentin (NEURONTIN) 300 MG capsule      1 tab po at bedtime 1st day, 1 tablet bid second day, then 1 tablet tid   20 capsule   0   . HYDROmorphone (DILAUDID) 4 MG tablet   Oral   Take 1 tablet (4 mg total) by mouth every 6 (six) hours as needed for severe pain.   20 tablet   0   . isosorbide mononitrate (IMDUR) 30 MG 24 hr tablet   Oral   Take 1 tablet (30 mg total) by mouth daily.   30 tablet   0   . nitroGLYCERIN (NITROSTAT) 0.4 MG SL tablet   Sublingual   Place 0.4 mg under the tongue every 5 (five) minutes as needed for chest pain.         . OxyCODONE (OXYCONTIN) 80 mg T12A 12 hr tablet   Oral   Take 1 tablet (80 mg total) by mouth every 12 (twelve) hours.   10 tablet   0   . predniSONE (STERAPRED UNI-PAK 21 TAB) 10 MG (21) TBPK tablet   Oral   Take 1 tablet (10 mg total) by mouth daily. 6 tabs PO x 1 day 5 tabs PO x 1 day 4 tabs PO x 1 day 3 tabs PO x 1 day 2 tabs PO x 1 day 1 tab PO x 1 day and stop   21 tablet   0   . traMADol (ULTRAM) 50 MG tablet   Oral   Take 1 tablet (50 mg total) by mouth every 6 (six) hours as needed. Patient taking differently: Take 50 mg by mouth every 6 (six) hours as needed for moderate pain.    15 tablet   0   . zolpidem (AMBIEN) 10 MG tablet   Oral   Take 1 tablet (10 mg total) by mouth at bedtime as needed for sleep.   10 tablet   0   .  LORazepam (ATIVAN) 1 MG tablet   Oral   Take 1 tablet (1 mg total) by mouth every 8 (eight) hours as needed for anxiety. Patient  not taking: Reported on 02/01/2015   12 tablet   0     Allergies Nsaids  Family History  Problem Relation Age of Onset  . CAD Other   . Breast cancer Mother   . Heart disease Mother   . Coronary artery disease Father     Social History History  Substance Use Topics  . Smoking status: Current Every Day Smoker -- 0.50 packs/day    Types: Cigarettes  . Smokeless tobacco: Not on file  . Alcohol Use: No    Review of Systems Constitutional: No fever/chills Eyes: No visual changes. ENT: No sore throat. Cardiovascular: See history of present illness Respiratory: Denies shortness of breath. She is wheezing. Gastrointestinal: No abdominal pain.  No nausea, no vomiting.  No diarrhea.  No constipation. Genitourinary: Negative for dysuria. Musculoskeletal: Negative for back pain. Skin: Negative for rash. Neurological: Negative for headaches, focal weakness or numbness.  10-point ROS otherwise negative.  ____________________________________________   PHYSICAL EXAM:  VITAL SIGNS: ED Triage Vitals  Enc Vitals Group     BP 02/01/15 1424 137/79 mmHg     Pulse Rate 02/01/15 1424 95     Resp 02/01/15 1424 22     Temp 02/01/15 1424 98.4 F (36.9 C)     Temp Source 02/01/15 1424 Oral     SpO2 02/01/15 1424 100 %     Weight 02/01/15 1424 100 lb (45.36 kg)     Height 02/01/15 1424 5\' 4"  (1.626 m)     Head Cir --      Peak Flow --      Pain Score 02/01/15 1425 10     Pain Loc --      Pain Edu? --      Excl. in [place]? --     Constitutional: Alert and oriented. Well appearing and in no acute distress. Eyes: Conjunctivae are normal. PERRL. EOMI. Head: Atraumatic. Fungating skin lesions over the left naris and left 4 head. No evidence of cellulitis. Nose: No congestion/rhinnorhea. Mouth/Throat: Mucous membranes are moist.  Oropharynx non-erythematous. Neck: No stridor.   Cardiovascular: Normal rate, regular rhythm. Grossly normal heart sounds.  Good peripheral  circulation. Respiratory: "Only and not hypoxic on 2 L nasal cannula which is her baseline. Slight use of accessory muscles with diffuse end expiratory wheezing throughout. Gastrointestinal: Soft and moderately tender along her old midline incision, she is evidently chronic. No distention. No abdominal bruits. No CVA tenderness. Musculoskeletal: No lower extremity tenderness nor edema.  No joint effusions. Neurologic:  Normal speech and language. No gross focal neurologic deficits are appreciated. Speech is normal. No gait instability. Skin:  Skin is warm, dry and intact. No rash noted. Psychiatric: Mood and affect are normal. Speech and behavior are normal.  ____________________________________________   LABS (all labs ordered are listed, but only abnormal results are displayed)  Labs Reviewed  CBC - Abnormal; Notable for the following:    WBC 13.6 (*)    RBC 5.57 (*)    MCV 71.7 (*)    MCH 21.5 (*)    MCHC 30.0 (*)    RDW 19.0 (*)    All other components within normal limits  COMPREHENSIVE METABOLIC PANEL - Abnormal; Notable for the following:    Potassium 3.0 (*)    Glucose, Bld 111 (*)    Calcium 8.6 (*)  Total Protein 6.4 (*)    Albumin 3.4 (*)    All other components within normal limits  TROPONIN I   ____________________________________________  EKG  Interpreted and reviewed by me Normal sinus rhythm ventricular rate 94, left atrial enlargement, there is an inferior infarct appearance, but this is old.  PR 134 QRS 76 QTc 460   Appear to previous EKG, I do not see any significant ischemic change this time.. Sinus rhythm. ____________________________________________  RADIOLOGY  CLINICAL DATA: Hip pain. Fall. Initial evaluation.  EXAM: RIGHT HIP (WITH PELVIS) 1 VIEW  COMPARISON: None.  FINDINGS: Diffuse severe osteopenia. Degenerative changes lumbar spine and both hips. No acute bony abnormality . Aortoiliac and iliofemoral atherosclerotic vascular  calcifications present. Right femoral stent. Surgical clips noted over the inguinal regions.  IMPRESSION: 1. No acute bony abnormality. No evidence of fracture dislocation. 2. Diffuse severe osteopenia degenerative change. 3. Peripheral vascular disease.   Electronically Signed By: Marcello Moores Register On: 02/01/2015 15:34          DG Chest 2 View (Final result) Result time: 02/01/15 15:42:56   Final result by Rad Results In Interface (02/01/15 15:42:56)   Narrative:   CLINICAL DATA: Episodes of intermittent chest pain for the past 3 days, vomiting yesterday, syncopal episode today ; history of CHF with cardiomyopathy, COPD, current smoker  EXAM: CHEST 2 VIEW  COMPARISON: PA and lateral chest x-ray of January 20, 2015  FINDINGS: The lungs are mildly hyperinflated and clear. There is a curvilinear line the projects in the right upper hemithorax beyond which lung markings are observed. This is not felt to reflect a pneumothorax. The heart and pulmonary vascularity are normal. The mediastinum is normal in width. There is no pleural effusion or pneumothorax. The permanent pacemaker defibrillator is in reasonable position radiographically. The bony thorax exhibits no acute abnormality.  IMPRESSION: COPD. There is no pneumonia nor CHF. A PA end-expiratory chest x-ray is recommended to assure that the atypical appearing pleural line in the right upper hemithorax does not reflect a pneumothorax.  These results were called by by me by telephone at the time of interpretation on 02/01/2015 at 3:42 pm to Dr. Hinda Kehr , who verbally acknowledged these results.      ____________________________________________   PROCEDURES  Procedure(s) performed: None  Critical Care performed: No  ____________________________________________   INITIAL IMPRESSION / ASSESSMENT AND PLAN / ED COURSE  Pertinent labs & imaging results that were available during my care of the  patient were reviewed by me and considered in my medical decision making (see chart for details).  Chest pain after syncope. Patient is awake alert in no distress. No evidence of acute trauma to my exam. No headache, no deficits. She did not fall and strike her head hard, she passed out and ended up on the floor that awoke well oriented. She initially stated she was having some hip pain, but this seems to resolve itself and I do not find any evidence to support acute hip fracture by exam though we will obtain x-ray.  Patient does have diffuse wheezing, this appears to be consistent with ongoing COPD and I will give her slight Medrol, she did take 20 mg on a taper earlier today. Also give her additional nebulizer treatment. Her EKG appears unchanged, we will check cardiac markers though I expect they may be elevated because of her recent NSTEMI. In addition she doesn't a history of arrhythmias, certainly this patient is someone who would benefit from further admission to the hospital  because of her past medical history and syncope with a history of strong cardiac disease.  Please note that I did not give aspirin to this patient because she has a history of NSAID allergy. She did take her Plavix this morning.  ----------------------------------------- 4:23 PM on 02/01/2015 -----------------------------------------  Patient remains symptomatically stable, lung sounds are slightly improved at this time. I discussed with Dr. Jannifer Franklin of the hospitalist service and we will admit the patient for syncope and observation because of her history of cardiac disease as well as prior ventricular tachycardia. In addition, chest x-ray repeat is pending to evaluate for possible small pleural line versus tiny pneumothorax  Dr. Jannifer Franklin is aware of this and also the troponin is pending. ____________________________________________   FINAL CLINICAL IMPRESSION(S) / ED DIAGNOSES  Final diagnoses:  Chest pain  Syncope and  collapse      Delman Kitten, MD 02/01/15 1624

## 2015-02-01 NOTE — ED Notes (Signed)
MD at bedside. 

## 2015-02-01 NOTE — ED Notes (Signed)
Bedside report received from Joette Catching., RN.

## 2015-02-01 NOTE — ED Notes (Signed)
Patient transported to X-ray 

## 2015-02-01 NOTE — Progress Notes (Signed)
   02/01/15 2000  Clinical Encounter Type  Visited With Patient  Visit Type Initial  Spiritual Encounters  Spiritual Needs Prayer  Stress Factors  Patient Stress Factors Health changes   Faith tradition: Christian Status: alert and oriented/recent Syncope and collapse Age/Sex: Female 20yrs Family: none present but has a daughter that does not have transportation Visit Assessment: Chaplain visited with patient and introduced pastoral care 24x7 and will lift patient up for prayers for comfort and healing;  Chaplains and pastoral care can be reached via pager [phone] or online request

## 2015-02-01 NOTE — ED Notes (Signed)
Pt comes into the ED via EMS from home, states she lives with her ex-husband and a friend and her child.the patient states she got up from a sitting position to go take a shower and passed out, states she woke with everyone around her..states she has been having intermittent chest pain for the past 3 days , vomiting yesterday. Pt is on continuous 4L [place] at home.the patient c/o right hip pain and increased pain in her back, states she has a hx of chronic back pain but since fall today is worse.Lynn Butler

## 2015-02-01 NOTE — Progress Notes (Signed)
*  PRELIMINARY RESULTS* Echocardiogram 2D Echocardiogram has been performed.  [place] 02/01/2015, 8:11 PM

## 2015-02-01 NOTE — Progress Notes (Signed)
ANTIBIOTIC CONSULT NOTE - INITIAL   Pharmacy Consult for Levaquin Indication: AECOPD  Allergies  Allergen Reactions  . Nsaids Nausea And Vomiting and Other (See Comments)    Reaction:  GI distress and burning     Patient Measurements: Height: 5\' 4"  (162.6 cm) (stated) Weight: 93 lb 14.4 oz (42.593 kg) (admission weight) IBW/kg (Calculated) : 54.7  Labs:  Recent Labs  02/01/15 1432  WBC 13.6*  HGB 12.0  PLT 236  CREATININE 0.68   Estimated Creatinine Clearance: 45.3 mL/min (by C-G formula based on Cr of 0.68).  Microbiology: Recent Results (from the past 720 hour(s))  Culture, blood (routine x 2)     Status: None   Collection Time: 01/13/15  6:30 PM  Result Value Ref Range Status   Specimen Description BLOOD  Final   Special Requests BLOOD  Final   Culture NO GROWTH 5 DAYS  Final   Report Status 01/18/2015 FINAL  Final  Culture, blood (routine x 2)     Status: None   Collection Time: 01/13/15  8:30 PM  Result Value Ref Range Status   Specimen Description BLOOD  Final   Special Requests NONE  Final   Culture NO GROWTH 5 DAYS  Final   Report Status 01/18/2015 FINAL  Final    Medical History: Past Medical History  Diagnosis Date  . COPD (chronic obstructive pulmonary disease)   . Hypertension   . Ischemic cardiomyopathy     a.   . Coronary artery disease     a. s/p multiple stenting in 2005  . History of ventricular tachycardia   . PVD (peripheral vascular disease)   . PAD (peripheral artery disease)   . HLD (hyperlipidemia)   . Chronic back pain   . MI, old   . MRSA (methicillin resistant staph aureus) culture positive     left foot wound  . CHF (congestive heart failure)     Medications:  Anti-infectives    Start     Dose/Rate Route Frequency Ordered Stop   02/02/15 2100  Levofloxacin (LEVAQUIN) IVPB 250 mg     250 mg 50 mL/hr over 60 Minutes Intravenous Every 24 hours 02/01/15 2000     02/01/15 2100  levofloxacin (LEVAQUIN) IVPB 500 mg     500  mg 100 mL/hr over 60 Minutes Intravenous  Once 02/01/15 2000       Assessment: Pharmacy consulted to dose levaquin for AECOPD in this 69 year old female  Plan:  Ordered levaquin 500mg  IV x 1 followed by 250mg  IV Q24H (renal dose adjustment of 500mg  Q24H)  Pharmacy to follow per consult    Lashonda Sonneborn C 02/01/2015,8:01 PM

## 2015-02-01 NOTE — H&P (Addendum)
[place] at [place] NAME: Lynn Butler    MR#:  [id number]  DATE OF BIRTH:  [birth year]  DATE OF ADMISSION:  02/01/2015  PRIMARY CARE PHYSICIAN: Juanell Fairly, MD   REQUESTING/REFERRING PHYSICIAN: Quale  CHIEF COMPLAINT:   Chief Complaint  Patient presents with  . Chest Pain  . Loss of Consciousness    HISTORY OF PRESENT ILLNESS:  Lynn Butler  is a 69 y.o. female who presents with syncopal episode. Patient was at home this afternoon, and had a syncopal episode when she stood up off of her couch. Patient's ex-husband was at home with her and states that he did not witness any seizure-like activity, he was actually concerned she may have had a stroke. The patient and her ex-husband state that she was out for about 1-2 minutes, and that when she woke up she was not confused, and did not lose control of her bowel or bladder, or bite her tongue. Patient has been admitted recently multiple times for non-STEMI. She's had extensive workup including cardiac catheterization which showed significant RCA occlusion. She was referred to [facility] cardiology after her last admission, but due to transportation issues has not been able to make her follow-up appointments. She states that she has chronic chest pain, but has never had a syncopal episode before. She is also had increased work of breathing with increased wheezing since her last discharge. 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 Hospitalists were called for admission for workup of her syncope and COPD exacerbation.  PAST MEDICAL HISTORY:   Past Medical History  Diagnosis Date  . COPD (chronic obstructive pulmonary disease)   . Hypertension   . Ischemic cardiomyopathy     a.   . Coronary artery disease     a. s/p multiple  stenting in 2005  . History of ventricular tachycardia   . PVD (peripheral vascular disease)   . PAD (peripheral artery disease)   . HLD (hyperlipidemia)   . Chronic back pain   . MI, old   . MRSA (methicillin resistant staph aureus) culture positive     left foot wound  . CHF (congestive heart failure)     PAST SURGICAL HISTORY:   Past Surgical History  Procedure Laterality Date  . Insert / replace / remove pacemaker    . Vascular bypass surgery Bilateral   . Cardiac catheterization N/A 01/14/2015    Procedure: Left Heart Cath;  Surgeon: Wellington Hampshire, MD;  Location: [place] [facility];  Service: Cardiovascular;  Laterality: N/A;  . Cardiac defibrillator placement    . Appendectomy      SOCIAL HISTORY:   History  Substance Use Topics  . Smoking status: Current Every Day Smoker -- 0.50 packs/day    Types: Cigarettes  . Smokeless tobacco: Not on file  . Alcohol Use: No    FAMILY HISTORY:   Family History  Problem Relation Age of Onset  . CAD Other   . Breast cancer Mother   . Heart disease Mother   . Coronary artery disease Father     DRUG ALLERGIES:   Allergies  Allergen Reactions  . Nsaids Nausea And Vomiting and Other (See Comments)    Reaction:  GI distress and burning     MEDICATIONS AT HOME:   Prior to Admission  medications   Medication Sig Start Date End Date Taking? Authorizing Provider  albuterol (PROAIR HFA) 108 (90 BASE) MCG/ACT inhaler Inhale 1-2 puffs into the lungs every 4 (four) hours as needed for wheezing or shortness of breath. 07/28/14  Yes Marijean Heath, NP  albuterol (PROVENTIL) (2.5 MG/3ML) 0.083% nebulizer solution Take 3 mLs (2.5 mg total) by nebulization every 6 (six) hours as needed for wheezing or shortness of breath. 01/17/15  Yes Gladstone Lighter, MD  amLODipine (NORVASC) 5 MG tablet Take 5 mg by mouth daily.   Yes Historical Provider, MD  atorvastatin (LIPITOR) 40 MG tablet Take 1 tablet (40 mg total) by mouth daily at  6 PM. 01/17/15  Yes Gladstone Lighter, MD  carvedilol (COREG) 3.125 MG tablet Take 1 tablet (3.125 mg total) by mouth 2 (two) times daily with a meal. 01/17/15  Yes Gladstone Lighter, MD  clopidogrel (PLAVIX) 75 MG tablet Take 75 mg by mouth daily.   Yes Historical Provider, MD  collagenase (SANTYL) ointment Apply topically daily. 12/19/14  Yes Dustin Flock, MD  diazepam (VALIUM) 10 MG tablet Take 1 tablet (10 mg total) by mouth every 12 (twelve) hours as needed for anxiety. 01/17/15  Yes Gladstone Lighter, MD  Fluticasone Furoate-Vilanterol (BREO ELLIPTA) 100-25 MCG/INH AEPB Inhale 1 puff into the lungs daily.   Yes Historical Provider, MD  Fluticasone-Salmeterol (ADVAIR) 250-50 MCG/DOSE AEPB Inhale 1 puff into the lungs 2 (two) times daily.   Yes Historical Provider, MD  furosemide (LASIX) 20 MG tablet Take 1 tablet (20 mg total) by mouth daily. 01/22/15  Yes Srikar Sudini, MD  gabapentin (NEURONTIN) 300 MG capsule 1 tab po at bedtime 1st day, 1 tablet bid second day, then 1 tablet tid 01/09/15  Yes Melynda Ripple, MD  HYDROmorphone (DILAUDID) 4 MG tablet Take 1 tablet (4 mg total) by mouth every 6 (six) hours as needed for severe pain. 01/22/15  Yes Srikar Sudini, MD  isosorbide mononitrate (IMDUR) 30 MG 24 hr tablet Take 1 tablet (30 mg total) by mouth daily. 01/22/15  Yes Srikar Sudini, MD  nitroGLYCERIN (NITROSTAT) 0.4 MG SL tablet Place 0.4 mg under the tongue every 5 (five) minutes as needed for chest pain.   Yes Historical Provider, MD  OxyCODONE (OXYCONTIN) 80 mg T12A 12 hr tablet Take 1 tablet (80 mg total) by mouth every 12 (twelve) hours. 01/22/15  Yes Srikar Sudini, MD  predniSONE (STERAPRED UNI-PAK 21 TAB) 10 MG (21) TBPK tablet Take 1 tablet (10 mg total) by mouth daily. 6 tabs PO x 1 day 5 tabs PO x 1 day 4 tabs PO x 1 day 3 tabs PO x 1 day 2 tabs PO x 1 day 1 tab PO x 1 day and stop 01/17/15  Yes Gladstone Lighter, MD  traMADol (ULTRAM) 50 MG tablet Take 1 tablet (50 mg total) by mouth  every 6 (six) hours as needed. Patient taking differently: Take 50 mg by mouth every 6 (six) hours as needed for moderate pain.  01/09/15  Yes Melynda Ripple, MD  zolpidem (AMBIEN) 10 MG tablet Take 1 tablet (10 mg total) by mouth at bedtime as needed for sleep. 01/17/15  Yes Gladstone Lighter, MD  LORazepam (ATIVAN) 1 MG tablet Take 1 tablet (1 mg total) by mouth every 8 (eight) hours as needed for anxiety. Patient not taking: Reported on 02/01/2015 01/17/15   Gladstone Lighter, MD    REVIEW OF SYSTEMS:  Review of Systems  Constitutional: Positive for fever (subjective), chills and malaise/fatigue. Negative for weight  loss.  HENT: Negative for ear pain, hearing loss and tinnitus.   Eyes: Negative for blurred vision, double vision, pain and redness.  Respiratory: Positive for cough, shortness of breath and wheezing. Negative for hemoptysis.   Cardiovascular: Positive for chest pain (chronic). Negative for palpitations, orthopnea and leg swelling.  Gastrointestinal: Negative for nausea, vomiting, abdominal pain, diarrhea and constipation.  Genitourinary: Negative for dysuria, frequency and hematuria.  Musculoskeletal: Negative for back pain, joint pain and neck pain.  Skin:       Multiple chronic basal cell appearing growths on her face, No acne, rash  Neurological: Positive for loss of consciousness (See history of present illness for details). Negative for dizziness, tremors, focal weakness and weakness.  Endo/Heme/Allergies: Negative for polydipsia. Does not bruise/bleed easily.  Psychiatric/Behavioral: Positive for depression. Negative for suicidal ideas. The patient is nervous/anxious. The patient does not have insomnia.      VITAL SIGNS:   Filed Vitals:   02/01/15 1424 02/01/15 1625  BP: 137/79 118/68  Pulse: 95 87  Temp: 98.4 F (36.9 C)   TempSrc: Oral   Resp: 22 22  Height: 5\' 4"  (1.626 m)   Weight: 45.36 kg (100 lb)   SpO2: 100% 96%   Wt Readings from Last 3 Encounters:   02/01/15 45.36 kg (100 lb)  01/22/15 44.77 kg (98 lb 11.2 oz)  01/16/15 44.861 kg (98 lb 14.4 oz)    PHYSICAL EXAMINATION:  Physical Exam  Constitutional: She is oriented to person, place, and time. She appears well-developed and well-nourished. No distress.  HENT:  Head: Normocephalic and atraumatic.  Mouth/Throat: Oropharynx is clear and moist.  Eyes: Conjunctivae and EOM are normal. Pupils are equal, round, and reactive to light. No scleral icterus.  Neck: Normal range of motion. Neck supple. No JVD present. No thyromegaly present.  Cardiovascular: Normal rate, regular rhythm and intact distal pulses.  Exam reveals no gallop and no friction rub.   No murmur heard. Respiratory: She is in respiratory distress. She has wheezes. She has no rales.  Significant bilateral coarse breath sounds with inspiratory and expiratory wheezing  GI: Soft. Bowel sounds are normal. She exhibits no distension. There is no tenderness.  Musculoskeletal: Normal range of motion. She exhibits no edema.  No arthritis, no gout  Lymphadenopathy:    She has no cervical adenopathy.  Neurological: She is alert and oriented to person, place, and time. No cranial nerve deficit.  No dysarthria, no aphasia  Skin: Skin is warm and dry. No rash noted. No erythema.  Large basal cell appearing growth on her effort forehead, with multiple smaller similar-appearing areas on her nose and forehead  Psychiatric: Her behavior is normal. Judgment and thought content normal.  Patient is anxious, and borderline tearful on exam    LABORATORY PANEL:   CBC  Recent Labs Lab 02/01/15 1432  WBC 13.6*  HGB 12.0  HCT 39.9  PLT 236   ------------------------------------------------------------------------------------------------------------------  Chemistries   Recent Labs Lab 02/01/15 1432  NA 142  K 3.0*  CL 105  CO2 28  GLUCOSE 111*  BUN 11  CREATININE 0.68  CALCIUM 8.6*  AST 19  ALT 23  ALKPHOS 74  BILITOT  0.3   ------------------------------------------------------------------------------------------------------------------  Cardiac Enzymes No results for input(s): TROPONINI in the last 168 hours. ------------------------------------------------------------------------------------------------------------------  RADIOLOGY:  Dg Chest 2 View  02/01/2015   CLINICAL DATA:  Episodes of intermittent chest pain for the past 3 days, vomiting yesterday, syncopal episode today ; history of CHF with cardiomyopathy, COPD,  current smoker  EXAM: CHEST  2 VIEW  COMPARISON:  PA and lateral chest x-ray of January 20, 2015  FINDINGS: The lungs are mildly hyperinflated and clear. There is a curvilinear line the projects in the right upper hemithorax beyond which lung markings are observed. This is not felt to reflect a pneumothorax. The heart and pulmonary vascularity are normal. The mediastinum is normal in width. There is no pleural effusion or pneumothorax. The permanent pacemaker defibrillator is in reasonable position radiographically. The bony thorax exhibits no acute abnormality.  IMPRESSION: COPD. There is no pneumonia nor CHF. A PA end-expiratory chest x-ray is recommended to assure that the atypical appearing pleural line in the right upper hemithorax does not reflect a pneumothorax.  These results were called by by me by telephone at the time of interpretation on 02/01/2015 at 3:42 pm to Dr. Hinda Kehr , who verbally acknowledged these results.   Electronically Signed   By: Margene Cherian  [place] M.D.   On: 02/01/2015 15:37   Dg Hip Unilat With Pelvis 1v Right  02/01/2015   CLINICAL DATA:  Hip pain.  Fall.  Initial evaluation.  EXAM: RIGHT HIP (WITH PELVIS) 1 VIEW  COMPARISON:  None.  FINDINGS: Diffuse severe osteopenia. Degenerative changes lumbar spine and both hips. No acute bony abnormality . Aortoiliac and iliofemoral atherosclerotic vascular calcifications present. Right femoral stent. Surgical clips noted over the  inguinal regions.  IMPRESSION: 1. No acute bony abnormality.  No evidence of fracture dislocation. 2. Diffuse severe osteopenia degenerative change. 3. Peripheral vascular disease.   Electronically Signed   By: Marcello Moores  Register   On: 02/01/2015 15:34    EKG:   Orders placed or performed during the hospital encounter of 02/01/15  . ED EKG (<28mins upon arrival to the ED)  . ED EKG (<47mins upon arrival to the ED)    IMPRESSION AND PLAN:  Principal Problem:   Syncope and collapse - unclear etiology, likely cardiac cause, question arrhythmia versus progressive heart failure. Troponin pending, based on this result will consider trending her troponins if it is still positive, echocardiogram ordered, cardiology consult ordered. Active Problems:   COPD exacerbation - patient seems to be bouncing consistently back into exacerbation state. That same and jaw and nebs in the ED with some improvement, will continue IV Solu-Medrol, when necessary nebs, and Levaquin given her elevated white count (though this may be from her recent steroid use). Consider pulmonary consult if patient is not improving.   Myocardial infarction in recovery phase - check and monitor troponins as above, cardiology consult as above.   CAD (coronary artery disease) - continue appropriate home medications for this, cardiology consult as above.   Chest pain - seems to be chronic angina, cardiology consult during recent admission recommended potentially adding Ranexa, may need to consider this option, cardiology consult as above.   HTN (hypertension) - controlled, continue home antihypertensives   Chronic respiratory failure with hypoxia - continue O2 via nasal cannula, treat COPD as above   Chronic combined systolic and diastolic CHF (congestive heart failure) - echocardiogram as above, continue home heart failure medications, does not seem to have significant pulmonary edema at this time.  All the records are reviewed and case  discussed with ED provider. Management plans discussed with the patient and/or family.  DVT PROPHYLAXIS: SubQ lovenox  ADMISSION STATUS: Observation  CODE STATUS: Full  TOTAL TIME TAKING CARE OF THIS PATIENT: 45 minutes.    Dorla Guizar FIELDING 02/01/2015, 4:54 PM  [employer] [facility]  Office  [phone]  CC: Primary care physician; Juanell Fairly, MD

## 2015-02-02 LAB — CBC
HCT: 35.4 % (ref 35.0–47.0)
Hemoglobin: 10.9 g/dL — ABNORMAL LOW (ref 12.0–16.0)
MCH: 22.2 pg — AB (ref 26.0–34.0)
MCHC: 30.8 g/dL — ABNORMAL LOW (ref 32.0–36.0)
MCV: 72.1 fL — AB (ref 80.0–100.0)
PLATELETS: 185 10*3/uL (ref 150–440)
RBC: 4.91 MIL/uL (ref 3.80–5.20)
RDW: 19 % — AB (ref 11.5–14.5)
WBC: 11.3 10*3/uL — ABNORMAL HIGH (ref 3.6–11.0)

## 2015-02-02 LAB — TROPONIN I
TROPONIN I: 0.07 ng/mL — AB (ref <0.031)
Troponin I: 0.08 ng/mL — ABNORMAL HIGH (ref <0.031)

## 2015-02-02 LAB — BASIC METABOLIC PANEL
ANION GAP: 8 (ref 5–15)
BUN: 18 mg/dL (ref 6–20)
CALCIUM: 8.5 mg/dL — AB (ref 8.9–10.3)
CO2: 30 mmol/L (ref 22–32)
CREATININE: 0.87 mg/dL (ref 0.44–1.00)
Chloride: 102 mmol/L (ref 101–111)
GFR calc non Af Amer: >60 mL/min (ref >60)
Glucose, Bld: 94 mg/dL (ref 65–99)
Potassium: 4.3 mmol/L (ref 3.5–5.1)
SODIUM: 140 mmol/L (ref 135–145)

## 2015-02-02 LAB — CK: CK TOTAL: 20 U/L — AB (ref 38–234)

## 2015-02-02 MED ORDER — HYDROCOD POLST-CPM POLST ER 10-8 MG/5ML PO SUER
5.0000 mL | Freq: Two times a day (BID) | ORAL | Status: DC | PRN
  Administered 2015-02-02 – 2015-02-03 (×2): 5 mL via ORAL
  Filled 2015-02-02 (×2): qty 5

## 2015-02-02 MED ORDER — ENOXAPARIN SODIUM 30 MG/0.3ML ~~LOC~~ SOLN: 30.0000 mg | SUBCUTANEOUS | Status: DC

## 2015-02-02 MED ORDER — LEVOFLOXACIN 250 MG PO TABS
250.0000 mg | ORAL_TABLET | Freq: Every day | ORAL | Status: DC
Start: 2015-02-02 — End: 2015-02-03
  Administered 2015-02-02: 250 mg via ORAL
  Filled 2015-02-02: qty 1

## 2015-02-02 MED ORDER — ENSURE ENLIVE PO LIQD
237.0000 mL | Freq: Three times a day (TID) | ORAL | Status: DC
  Administered 2015-02-02 – 2015-02-03 (×3): 237 mL via ORAL

## 2015-02-02 MED ORDER — HYDROMORPHONE HCL 2 MG PO TABS
4.0000 mg | ORAL_TABLET | Freq: Four times a day (QID) | ORAL | Status: DC | PRN
  Administered 2015-02-02 – 2015-02-03 (×2): 4 mg via ORAL
  Filled 2015-02-02 (×2): qty 2

## 2015-02-02 MED ORDER — PREDNISONE 50 MG PO TABS
50.0000 mg | ORAL_TABLET | Freq: Every day | ORAL | Status: DC
  Administered 2015-02-03: 50 mg via ORAL
  Filled 2015-02-02: qty 1

## 2015-02-02 NOTE — Plan of Care (Signed)
Problem: Phase I Progression Outcomes Goal: Tolerating diet Patient remained hemodynamically stable. She received PRN meds for anxiety and insomnia. Patient rested well and maintained stable VS. Patient's bed alarm was kept on with bed at the lowest.Patient was offered bathroom assistance with safety rounding and needed items kept within patient's reach. Will continue to monitor

## 2015-02-02 NOTE — Consult Note (Signed)
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  Followed by [facility] Wound type:Vascular ulcer to left anterior leg.   Pressure Ulcer POA: N/A Measurement:1.8 cm x 1 cm x 0.1 cm Wound GTX:MIWO pink nongraulating Drainage (amount, consistency, odor)Minimal serosanguinous drainage.  No odor.   Periwound:Intact. Scarring from resolving wound, new epithelium.  Dressing procedure/placement/frequency:Cleanse left lower leg with NS and pat gently dry.  Apply vaseline gauze to wound bed.  Cover with 2x2 and wrap with kerlix.  Secure with netting.  Change Mon-Wed-Fri Will not follow at this time.  Please re-consult if needed.  Domenic Moras RN BSN [place] Pager [phone]

## 2015-02-02 NOTE — Care Management (Signed)
[place] patient readmitted.  Notified [facility].  Will discuss what changes if any were made to the home health plan to prevent readmission

## 2015-02-02 NOTE — Progress Notes (Signed)
[place] at [place] NAME: Lynn Butler    MR#:  [id number]   DATE OF BIRTH:  Apr 30, 1946  SUBJECTIVE:  CHIEF COMPLAINT:   Chief Complaint  Patient presents with  . Chest Pain  . Loss of Consciousness   Admitted for syncope chest pain and sob which are chronic. Has chronic wheezing. On high dose narcotics.  REVIEW OF SYSTEMS:  Review of Systems  Constitutional: Negative for fever, weight loss, malaise/fatigue and diaphoresis.  HENT: Negative for ear discharge, ear pain, hearing loss, nosebleeds, sore throat and tinnitus.   Eyes: Negative for blurred vision and pain.  Respiratory: Positive for shortness of breath and wheezing. Negative for cough and hemoptysis.   Cardiovascular: Negative for chest pain, palpitations (chest pressure), orthopnea and leg swelling.  Gastrointestinal: Positive for heartburn and abdominal pain. Negative for nausea, vomiting, diarrhea, constipation and blood in stool.       Does have chronic abdominal pain.  Genitourinary: Negative for dysuria, urgency and frequency.  Musculoskeletal: Positive for back pain. Negative for myalgias.  Skin: Negative for itching and rash.  Neurological: Negative for dizziness, tingling, tremors, focal weakness, seizures, weakness and headaches.  Psychiatric/Behavioral: Negative for depression. The patient is not nervous/anxious.    DRUG ALLERGIES:   Allergies  Allergen Reactions  . Nsaids Nausea And Vomiting and Other (See Comments)    Reaction:  GI distress and burning    VITALS:  Blood pressure 122/70, pulse 66, temperature 97.3 F (36.3 C), temperature source Oral, resp. rate 20, height 5\' 4"  (1.626 m), weight 43.636 kg (96 lb 3.2 oz), SpO2 100 %. PHYSICAL EXAMINATION:  Physical Exam  Constitutional: She is oriented to person, place, and time. She appears malnourished. She appears unhealthy. She appears cachectic.  HENT:  Head: Normocephalic and atraumatic.    Eyes: Conjunctivae and EOM are normal. Pupils are equal, round, and reactive to light.  Neck: Normal range of motion. Neck supple. No tracheal deviation present. No thyromegaly present.  Cardiovascular: Normal rate, regular rhythm and normal heart sounds.   Pulmonary/Chest: Effort normal and breath sounds normal. No respiratory distress. She has no wheezes. She exhibits no tenderness.  Occasional scattered wheezes.  Abdominal: Soft. Bowel sounds are normal. She exhibits no distension. There is no tenderness.  Musculoskeletal: Normal range of motion.  Neurological: She is alert and oriented to person, place, and time. No cranial nerve deficit.  Skin: Skin is warm and dry. No rash noted.     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   Psychiatric: Mood and affect normal.   LABORATORY PANEL:   CBC  Recent Labs Lab 02/02/15 0334  WBC 11.3*  HGB 10.9*  HCT 35.4  PLT 185   ------------------------------------------------------------------------------------------------------------------ Chemistries   Recent Labs Lab 02/01/15 1432 02/02/15 0334  NA 142 140  K 3.0* 4.3  CL 105 102  CO2 28 30  GLUCOSE 111* 94  BUN 11 18  CREATININE 0.68 0.87  CALCIUM 8.6* 8.5*  AST 19  --   ALT 23  --   ALKPHOS 74  --   BILITOT 0.3  --    RADIOLOGY:  Dg Chest 2 View  02/01/2015   CLINICAL DATA:  Episodes of intermittent chest pain for the past 3 days, vomiting yesterday, syncopal episode today ; history of CHF with cardiomyopathy, COPD, current smoker  EXAM: CHEST  2 VIEW  COMPARISON:  PA and lateral chest x-ray of January 20, 2015  FINDINGS: The lungs are  mildly hyperinflated and clear. There is a curvilinear line the projects in the right upper hemithorax beyond which lung markings are observed. This is not felt to reflect a pneumothorax. The heart and pulmonary vascularity are normal. The mediastinum is normal in width. There is no pleural effusion or pneumothorax. The permanent  pacemaker defibrillator is in reasonable position radiographically. The bony thorax exhibits no acute abnormality.  IMPRESSION: COPD. There is no pneumonia nor CHF. A PA end-expiratory chest x-ray is recommended to assure that the atypical appearing pleural line in the right upper hemithorax does not reflect a pneumothorax.  These results were called by by me by telephone at the time of interpretation on 02/01/2015 at 3:42 pm to Dr. Hinda Kehr , who verbally acknowledged these results.   Electronically Signed   By: David  Martinique M.D.   On: 02/01/2015 15:37   Dg Chest Port 1 View  02/01/2015   CLINICAL DATA:  Patient with repeat chest radiograph to evaluate for possible right pneumothorax.  EXAM: PORTABLE CHEST - 1 VIEW  COMPARISON:  Earlier same date chest radiograph.  FINDINGS: 2 additional views of the thorax were obtained. These demonstrate stable enlarged cardiac and mediastinal contours. Single lead AICD device projects over the left hemi thorax, stable in position. Lungs are clear. On repeat imaging with the patient out of the bed, there is no definite evidence for apical pneumothorax. Findings were likely secondary to overlapping soft tissues. No definite pleural effusion.  IMPRESSION: No definite apical pneumothorax. Previously described findings are likely secondary to skin fold.  Cardiomegaly.   Electronically Signed   By: Lovey Newcomer M.D.   On: 02/01/2015 18:13   Dg Hip Unilat With Pelvis 1v Right  02/01/2015   CLINICAL DATA:  Hip pain.  Fall.  Initial evaluation.  EXAM: RIGHT HIP (WITH PELVIS) 1 VIEW  COMPARISON:  None.  FINDINGS: Diffuse severe osteopenia. Degenerative changes lumbar spine and both hips. No acute bony abnormality . Aortoiliac and iliofemoral atherosclerotic vascular calcifications present. Right femoral stent. Surgical clips noted over the inguinal regions.  IMPRESSION: 1. No acute bony abnormality.  No evidence of fracture dislocation. 2. Diffuse severe osteopenia degenerative  change. 3. Peripheral vascular disease.   Electronically Signed   By: Marcello Moores  Register   On: 02/01/2015 15:34   ASSESSMENT AND PLAN:   * Syncope Seems orthostatic as it happened immediately after standing up. On tele to r/o arrythmias  * Chronic chest pain - Pleuritic. Likely from COPD Also some chronic angina ASA, Plavix, Statin. Had recent cath with RCA occlusion. Will need [facility] cardiology f/u as OP.  *  Anemia: Hemoglobin low but stable.   * Peripheral vascular disease with ulcer of the left lower extremity- patient is on Plavix. Ulcer is healing.   Follows with [facility] wound clinic.  * Tobacco abuse smoking cessation counseling done  * Ulcerative lesion on left forehead. No recent increase in size. Following with [facility] dermatology. Continue to monitor. Or can follow up with the plastic surgeon. No inpatient needs.  * Chronic pain syndrome OP pain clinic f/u  All the records are reviewed and case discussed with Care Management/Social Worker. Management plans discussed with the patient, and she is in agreement.  CODE STATUS: full code  TOTAL TIME TAKING CARE OF THIS PATIENT: 35 minutes.   More than 50% of the time was spent in counseling/coordination of care: YES  POSSIBLE D/C TOMORROW, DEPENDING ON CLINICAL CONDITION.   Hillary Bow R M.D on 02/02/2015 at 9:52 AM  Between 7am to 6pm - Pager - [phone]  After 6pm go to [email or web address] - password [email or web address] [facility]  [place] [facility]  Office  [phone]  CC:  Primary care physician; Juanell Fairly, MD

## 2015-02-02 NOTE — Care Management (Signed)
Lynn Butler responds that it is difficult to locate patient for home visits because she usually travels between 2 daughters homes to reside.  Lynn Butler will come to unit this afternoon to discuss issues with scheuling home visits

## 2015-02-03 MED ORDER — LORAZEPAM 1 MG PO TABS: 1.0000 mg | ORAL_TABLET | Freq: Three times a day (TID) | ORAL | Status: DC | PRN

## 2015-02-03 MED ORDER — DIAZEPAM 10 MG PO TABS: 10.0000 mg | ORAL_TABLET | Freq: Two times a day (BID) | ORAL | Status: DC | PRN

## 2015-02-03 MED ORDER — IPRATROPIUM-ALBUTEROL 0.5-2.5 (3) MG/3ML IN SOLN: 3.0000 mL | Freq: Four times a day (QID) | RESPIRATORY_TRACT | Status: DC | PRN

## 2015-02-03 MED ORDER — HYDROMORPHONE HCL 4 MG PO TABS: 4.0000 mg | ORAL_TABLET | Freq: Four times a day (QID) | ORAL | Status: DC | PRN

## 2015-02-03 MED ORDER — PREDNISONE 10 MG (21) PO TBPK: 10.0000 mg | ORAL_TABLET | Freq: Every day | ORAL | Status: DC

## 2015-02-03 MED ORDER — ENSURE ENLIVE PO LIQD: 237.0000 mL | Freq: Three times a day (TID) | ORAL | Status: DC

## 2015-02-03 MED ORDER — LEVOFLOXACIN 250 MG PO TABS: 250.0000 mg | ORAL_TABLET | Freq: Every day | ORAL | Status: DC

## 2015-02-03 MED ORDER — OXYCODONE HCL ER 80 MG PO T12A: 80.0000 mg | EXTENDED_RELEASE_TABLET | Freq: Two times a day (BID) | ORAL | Status: DC

## 2015-02-03 NOTE — Discharge Summary (Signed)
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 COPD (chronic obstructive pulmonary disease)   . Hypertension   . Ischemic cardiomyopathy     a.   . Coronary artery disease     a. s/p multiple stenting in 2005  . History of ventricular tachycardia   . PVD (peripheral vascular disease)   . PAD (peripheral artery disease)   . HLD (hyperlipidemia)   . Chronic back pain   . MI, old   . MRSA (methicillin resistant staph aureus) culture positive     left foot wound  . CHF (congestive heart failure)     HOSPITAL COURSE:  Please review history and physical for complete details  * Syncope Seems orthostatic clinically, but orthostatic blood pressures are not impressive On tele - no arrythmias Discussed with the [facility] cardiology Dr. Saralyn Pilar, who has recommended outpatient follow-up in 2-3 days/outpatient syncope workup/probably leep monitor  * Chronic chest pain - Pleuritic. Likely from COPD Also some chronic angina ASA, Plavix, Statin. Had recent cath with RCA occlusion. Seen by [facility] cardiology in the past , she prefers following up with [facility] in the future, f/u as OP in 3 days.  * Anemia: Hemoglobin low but stable.   * Peripheral vascular disease with ulcer of the left  lower extremity- patient is on Plavix. Ulcer is healing.  Follows with [facility] wound clinic.  * Tobacco abuse smoking cessation counseling done  * Ulcerative lesion on left forehead. No recent increase in size. Following with [facility] dermatology, probably cancerous lesion. Continue to monitor. No inpatient needs.  * Chronic pain syndrome OP pain clinic f/u.   All the records are reviewed and case discussed with Care Management/Social Worker. Management plans discussed with the patient, and she is in agreement.  CODE STATUS: full code  TOTAL TIME TAKING CARE OF THIS PATIENT: 35 minutes.   More than 50% of the time was spent in counseling/coordination of care: YES     DISCHARGE CONDITIONS:   Satisfactory  CONSULTS OBTAINED:  Treatment Team:  Isaias Cowman, MD Nicholes Mango, MD   PROCEDURES none  DRUG ALLERGIES:   Allergies  Allergen Reactions  . Nsaids Nausea And Vomiting and Other (See Comments)    Reaction:  GI distress and burning     DISCHARGE MEDICATIONS:   Current Discharge Medication List    START taking these medications   Details  feeding supplement, ENSURE ENLIVE, (ENSURE ENLIVE) LIQD Take 237 mLs by mouth 3 (three) times daily with meals. Qty: 237 mL, Refills: 12    ipratropium-albuterol (DUONEB) 0.5-2.5 (3) MG/3ML SOLN Take 3 mLs by nebulization every 6 (six) hours as needed. Qty: 360 mL, Refills: 0    levofloxacin (LEVAQUIN) 250 MG tablet Take 1 tablet (250 mg total) by mouth at bedtime. Qty:  5 tablet, Refills: 0      CONTINUE these medications which have CHANGED   Details  !! diazepam (VALIUM) 10 MG tablet Take 1 tablet (10 mg total) by mouth every 12 (twelve) hours as needed for anxiety. Qty: 30 tablet, Refills: 0    !! diazepam (VALIUM) 10 MG tablet Take 1 tablet (10 mg total) by mouth every 12 (twelve) hours as needed for anxiety. Qty: 10 tablet, Refills: 0    HYDROmorphone (DILAUDID) 4 MG tablet Take 1 tablet (4 mg total) by mouth every  6 (six) hours as needed for severe pain. Qty: 15 tablet, Refills: 0    LORazepam (ATIVAN) 1 MG tablet Take 1 tablet (1 mg total) by mouth every 8 (eight) hours as needed for anxiety. Qty: 10 tablet, Refills: 0    OxyCODONE (OXYCONTIN) 80 mg T12A 12 hr tablet Take 1 tablet (80 mg total) by mouth every 12 (twelve) hours. Qty: 10 tablet, Refills: 0    predniSONE (STERAPRED UNI-PAK 21 TAB) 10 MG (21) TBPK tablet Take 1 tablet (10 mg total) by mouth daily. 6 tabs PO x 1 day 5 tabs PO x 1 day 4 tabs PO x 1 day 3 tabs PO x 1 day 2 tabs PO x 1 day 1 tab PO x 1 day and stop Qty: 21 tablet, Refills: 0     !! - Potential duplicate medications found. Please discuss with provider.    CONTINUE these medications which have NOT CHANGED   Details  albuterol (PROAIR HFA) 108 (90 BASE) MCG/ACT inhaler Inhale 1-2 puffs into the lungs every 4 (four) hours as needed for wheezing or shortness of breath.    albuterol (PROVENTIL) (2.5 MG/3ML) 0.083% nebulizer solution Take 3 mLs (2.5 mg total) by nebulization every 6 (six) hours as needed for wheezing or shortness of breath. Qty: 75 mL, Refills: 0    amLODipine (NORVASC) 5 MG tablet Take 5 mg by mouth daily.    atorvastatin (LIPITOR) 40 MG tablet Take 1 tablet (40 mg total) by mouth daily at 6 PM. Qty: 30 tablet, Refills: 0    carvedilol (COREG) 3.125 MG tablet Take 1 tablet (3.125 mg total) by mouth 2 (two) times daily with a meal. Qty: 60 tablet, Refills: 2    clopidogrel (PLAVIX) 75 MG tablet Take 75 mg by mouth daily.    collagenase (SANTYL) ointment Apply topically daily. Qty: 15 g, Refills: 0    Fluticasone Furoate-Vilanterol (BREO ELLIPTA) 100-25 MCG/INH AEPB Inhale 1 puff into the lungs daily.    Fluticasone-Salmeterol (ADVAIR) 250-50 MCG/DOSE AEPB Inhale 1 puff into the lungs 2 (two) times daily.    furosemide (LASIX) 20 MG tablet Take 1 tablet (20 mg total) by mouth daily. Qty: 30 tablet, Refills: 0    gabapentin (NEURONTIN) 300 MG  capsule 1 tab po at bedtime 1st day, 1 tablet bid second day, then 1 tablet tid Qty: 20 capsule, Refills: 0    isosorbide mononitrate (IMDUR) 30 MG 24 hr tablet Take 1 tablet (30 mg total) by mouth daily. Qty: 30 tablet, Refills: 0    nitroGLYCERIN (NITROSTAT) 0.4 MG SL tablet Place 0.4 mg under the tongue every 5 (five) minutes as needed for chest pain.    traMADol (ULTRAM) 50 MG tablet Take 1 tablet (50 mg total) by mouth every 6 (six) hours as needed. Qty: 15 tablet, Refills: 0    zolpidem (AMBIEN) 10 MG tablet Take 1 tablet (10 mg total) by mouth at bedtime as needed for sleep. Qty: 10  tablet, Refills: 0         DISCHARGE INSTRUCTIONS:  Follow up with primary care physician in 2-3 days  Follow-up with cardiology-[facility]-in 2-3 days Follow-up with [facility] dermatology as scheduled Follow-up with wound care as an outpatient Follow-up with  pain management as recommended Continue home health Follow-up with outpatient cardiac and pulmonary rehabilitation in a week   DIET:  Healthy heart  DISCHARGE CONDITION:  Satisfactory  ACTIVITY:  Activity as tolerated  OXYGEN:  Home Oxygen: Yes.     Oxygen Delivery: 4 liters/min via [place]  DISCHARGE LOCATION:  home with home heal  If you experience worsening of your admission symptoms, develop shortness of breath, life threatening emergency, suicidal or homicidal thoughts you must seek medical attention immediately by calling 911 or calling your MD immediately  if symptoms less severe.  You Must read complete instructions/literature along with all the possible adverse reactions/side effects for all the Medicines you take and that have been prescribed to you. Take any new Medicines after you have completely understood and accpet all the possible adverse reactions/side effects.   Please note  You were cared for by a hospitalist during your hospital stay. If you have any questions about your discharge medications or the care you received while  you were in the hospital after you are discharged, you can call the unit and asked to speak with the hospitalist on call if the hospitalist that took care of you is not available. Once you are discharged, your primary care physician will handle any further medical issues. Please note that NO REFILLS for any discharge medications will be authorized once you are discharged, as it is imperative that you return to your primary care physician (or establish a relationship with a primary care physician if you do not have one) for your aftercare needs so that they can reassess your need for medications and monitor your lab values.     Today  Chief Complaint  Patient presents with  . Chest Pain  . Loss of Consciousness   Denies any dizziness today, breathing at her baseline. Lives on 4 L of oxygen chronically  ROS: Chronic chest pain and shortness of breath CONSTITUTIONAL: Denies fevers, chills. Denies any fatigue, weakness.  EYES: Denies blurry vision, double vision, eye pain. EARS, NOSE, THROAT: Denies tinnitus, ear pain, hearing loss. RESPIRATORY: Denies worsening of cough, wheeze, shortness of breath.  CARDIOVASCULAR: Denies chest pain, palpitations, edema.  GASTROINTESTINAL: Denies nausea, vomiting, diarrhea, abdominal pain. Denies bright red blood per rectum. GENITOURINARY: Denies dysuria, hematuria. ENDOCRINE: Denies nocturia or thyroid problems. HEMATOLOGIC AND LYMPHATIC: Denies easy bruising or bleeding. SKIN: Denies rash or lesion. MUSCULOSKELETAL: Denies pain in neck, back, shoulder, knees, hips or arthritic symptoms.  NEUROLOGIC: Denies paralysis, paresthesias.  PSYCHIATRIC: Denies anxiety or depressive symptoms.   VITAL SIGNS:  Blood pressure 114/52, pulse 78, temperature 98.4 F (36.9 C), temperature source Oral, resp. rate 17, height 5\' 4"  (1.626 m), weight 44.815 kg (98 lb 12.8 oz), SpO2 99 %.  I/O:   Intake/Output Summary (Last 24 hours) at 02/03/15 1219 Last data filed at  02/03/15 1055  Gross per 24 hour  Intake      0 ml  Output    550 ml  Net   -550 ml    PHYSICAL EXAMINATION:  GENERAL:  69 y.o.-year-old patient lying in the bed with no acute distress.  EYES: Pupils equal, round, reactive to light and accommodation. No scleral icterus. Extraocular muscles intact.  HEENT: Head atraumatic, normocephalic. Oropharynx  and nasopharynx clear.  NECK:  Supple, no jugular venous distention. No thyroid enlargement, no tenderness.  LUNGS: Normal breath sounds bilaterally, no wheezing, rales,rhonchi or crepitation. No use of accessory muscles of respiration.  CARDIOVASCULAR: S1, S2 normal. No murmurs, rubs, or gallops.  ABDOMEN: Soft, non-tender, non-distended. Bowel sounds present. No organomegaly or mass.  EXTREMITIES: No pedal edema, cyanosis, or clubbing.  NEUROLOGIC: Cranial nerves II through XII are intact. Muscle strength 5/5 in all extremities. Sensation intact. Gait not checked.  PSYCHIATRIC: The patient is alert and oriented x 3.  SKIN: No obvious rash, lesion, or ulcer.   DATA REVIEW:   CBC  Recent Labs Lab 02/02/15 0334  WBC 11.3*  HGB 10.9*  HCT 35.4  PLT 185    Chemistries   Recent Labs Lab 02/01/15 1432 02/02/15 0334  NA 142 140  K 3.0* 4.3  CL 105 102  CO2 28 30  GLUCOSE 111* 94  BUN 11 18  CREATININE 0.68 0.87  CALCIUM 8.6* 8.5*  AST 19  --   ALT 23  --   ALKPHOS 74  --   BILITOT 0.3  --     Cardiac Enzymes  Recent Labs Lab 02/02/15 0334  TROPONINI 0.07*    Microbiology Results  Results for orders placed or performed during the hospital encounter of 01/13/15  Culture, blood (routine x 2)     Status: None   Collection Time: 01/13/15  6:30 PM  Result Value Ref Range Status   Specimen Description BLOOD  Final   Special Requests BLOOD  Final   Culture NO GROWTH 5 DAYS  Final   Report Status 01/18/2015 FINAL  Final  Culture, blood (routine x 2)     Status: None   Collection Time: 01/13/15  8:30 PM  Result Value  Ref Range Status   Specimen Description BLOOD  Final   Special Requests NONE  Final   Culture NO GROWTH 5 DAYS  Final   Report Status 01/18/2015 FINAL  Final    RADIOLOGY:  Dg Chest 2 View  02/01/2015   CLINICAL DATA:  Episodes of intermittent chest pain for the past 3 days, vomiting yesterday, syncopal episode today ; history of CHF with cardiomyopathy, COPD, current smoker  EXAM: CHEST  2 VIEW  COMPARISON:  PA and lateral chest x-ray of January 20, 2015  FINDINGS: The lungs are mildly hyperinflated and clear. There is a curvilinear line the projects in the right upper hemithorax beyond which lung markings are observed. This is not felt to reflect a pneumothorax. The heart and pulmonary vascularity are normal. The mediastinum is normal in width. There is no pleural effusion or pneumothorax. The permanent pacemaker defibrillator is in reasonable position radiographically. The bony thorax exhibits no acute abnormality.  IMPRESSION: COPD. There is no pneumonia nor CHF. A PA end-expiratory chest x-ray is recommended to assure that the atypical appearing pleural line in the right upper hemithorax does not reflect a pneumothorax.  These results were called by by me by telephone at the time of interpretation on 02/01/2015 at 3:42 pm to Dr. Hinda Kehr , who verbally acknowledged these results.   Electronically Signed   By: David  Martinique M.D.   On: 02/01/2015 15:37   Dg Chest Port 1 View  02/01/2015   CLINICAL DATA:  Patient with repeat chest radiograph to evaluate for possible right pneumothorax.  EXAM: PORTABLE CHEST - 1 VIEW  COMPARISON:  Earlier same date chest radiograph.  FINDINGS: 2 additional views of the thorax  were obtained. These demonstrate stable enlarged cardiac and mediastinal contours. Single lead AICD device projects over the left hemi thorax, stable in position. Lungs are clear. On repeat imaging with the patient out of the bed, there is no definite evidence for apical pneumothorax. Findings were  likely secondary to overlapping soft tissues. No definite pleural effusion.  IMPRESSION: No definite apical pneumothorax. Previously described findings are likely secondary to skin fold.  Cardiomegaly.   Electronically Signed   By: Lovey Newcomer M.D.   On: 02/01/2015 18:13   Dg Hip Unilat With Pelvis 1v Right  02/01/2015   CLINICAL DATA:  Hip pain.  Fall.  Initial evaluation.  EXAM: RIGHT HIP (WITH PELVIS) 1 VIEW  COMPARISON:  None.  FINDINGS: Diffuse severe osteopenia. Degenerative changes lumbar spine and both hips. No acute bony abnormality . Aortoiliac and iliofemoral atherosclerotic vascular calcifications present. Right femoral stent. Surgical clips noted over the inguinal regions.  IMPRESSION: 1. No acute bony abnormality.  No evidence of fracture dislocation. 2. Diffuse severe osteopenia degenerative change. 3. Peripheral vascular disease.   Electronically Signed   By: Marcello Moores  Register   On: 02/01/2015 15:34    EKG:   Orders placed or performed during the hospital encounter of 02/01/15  . ED EKG (<78mins upon arrival to the ED)  . ED EKG (<92mins upon arrival to the ED)  . EKG 12-Lead  . EKG 12-Lead      Management plans discussed with the patient, family and they are in agreement.  CODE STATUS:     Code Status Orders        Start     Ordered   02/01/15 1817  Full code   Continuous     02/01/15 1817      TOTAL TIME TAKING CARE OF THIS PATIENT: 45 minutes.    @[facility] @  on 02/03/2015 at 12:19 PM  Between 7am to 6pm - Pager - [phone]  After 6pm go to [email or web address]  Office  [phone]  CC: Primary care physician; Juanell Fairly, MD

## 2015-02-03 NOTE — Progress Notes (Signed)
VSS. 4 L of oxygen. NSR. Takes meds ok. Pt reported anxiety and received valium. Iso for MRSA in L foot. Tolerating diet well. Refused to work with PT. Cardio consult completed. Pt has no further concerns at this time.

## 2015-02-03 NOTE — Consult Note (Signed)
Lynn Butler  CARDIOLOGY CONSULT NOTE  Patient ID: Lynn Butler MRN: [id number] DOB/AGE: 1946/02/17 69 y.o.  Admit date: 02/01/2015 Referring Physician Dr. Margaretmary Eddy Primary Physician   Primary Cardiologist   Reason for Consultation possible syncopal episode  HPI: Pt is a 69 yo female with multiple medical problems including cad s/p pci at [facility] in 2005 with at least one stent in the circumflex distribution was admitted with chest discomfort and "pain all over", severe oxygen dependent copd, pvd s/p bilateral fem pop bypasses, ischemic cardiomyopathy with ef approximately 20-25%, vt with aicd in place followed at [facility], chronic pain syndrome, continued chronic tobacco abuse who is s/p surgical release of sbo and lysis of adhesions 4/16. She was admitted withwith syncopal episode. Patient was at home this afternoon, and had a syncopal episode when she stood up off of her couch. Patient's ex-husband was at home with her and states that he did not witness any seizure-like activity, he was actually concerned she may have had a stroke. Patient has not had follow-up with her cardiologist or primary care provider. She underwent cardiac catheterization several weeks ago which revealed an occluded RCA with diffusely diseased circumflex felt to be amenable to medical therapy. She has not ruled in for a myocardial infarction on this occasion. Her serum troponin was 0.08. She has improved. Electrocardiogram reveals sinus rhythm nonspecific ST-T wave changes. She is currently hemodynamically stable. ROS Review of Systems - History obtained from chart review and the patient General ROS: positive for  - fatigue, sleep disturbance and weight loss Respiratory ROS: positive for - cough and shortness of breath Cardiovascular ROS: positive for - Atypical chest pain Gastrointestinal ROS: positive for - abdominal pain Musculoskeletal ROS: positive for - muscle pain Neurological ROS:  no TIA or stroke symptoms   Past Medical History  Diagnosis Date  . COPD (chronic obstructive pulmonary disease)   . Hypertension   . Ischemic cardiomyopathy     a.   . Coronary artery disease     a. s/p multiple stenting in 2005  . History of ventricular tachycardia   . PVD (peripheral vascular disease)   . PAD (peripheral artery disease)   . HLD (hyperlipidemia)   . Chronic back pain   . MI, old   . MRSA (methicillin resistant staph aureus) culture positive     left foot wound  . CHF (congestive heart failure)     Family History  Problem Relation Age of Onset  . CAD Other   . Breast cancer Mother   . Heart disease Mother   . Coronary artery disease Father     History   Social History  . Marital Status: Widowed    Spouse Name: N/A  . Number of Children: N/A  . Years of Education: N/A   Occupational History  . Not on file.   Social History Main Topics  . Smoking status: Current Every Day Smoker -- 0.50 packs/day    Types: Cigarettes  . Smokeless tobacco: Not on file  . Alcohol Use: No  . Drug Use: No  . Sexual Activity: Not on file   Other Topics Concern  . Not on file   Social History Narrative    Past Surgical History  Procedure Laterality Date  . Insert / replace / remove pacemaker    . Vascular bypass surgery Bilateral   . Cardiac catheterization N/A 01/14/2015    Procedure: Left Heart Cath;  Surgeon: Wellington Hampshire, MD;  Location: [place] [facility];  Service: Cardiovascular;  Laterality: N/A;  . Cardiac defibrillator placement    . Appendectomy       Prescriptions prior to admission  Medication Sig Dispense Refill Last Dose  . albuterol (PROAIR HFA) 108 (90 BASE) MCG/ACT inhaler Inhale 1-2 puffs into the lungs every 4 (four) hours as needed for wheezing or shortness of breath.   02/01/2015 at Unknown time  . albuterol (PROVENTIL) (2.5 MG/3ML) 0.083% nebulizer solution Take 3 mLs (2.5 mg total) by nebulization every 6 (six) hours as needed for  wheezing or shortness of breath. 75 mL 0 02/01/2015 at Unknown time  . amLODipine (NORVASC) 5 MG tablet Take 5 mg by mouth daily.   02/01/2015 at Unknown time  . atorvastatin (LIPITOR) 40 MG tablet Take 1 tablet (40 mg total) by mouth daily at 6 PM. 30 tablet 0 01/31/2015 at Unknown time  . carvedilol (COREG) 3.125 MG tablet Take 1 tablet (3.125 mg total) by mouth 2 (two) times daily with a meal. 60 tablet 2 02/01/2015 at Unknown time  . clopidogrel (PLAVIX) 75 MG tablet Take 75 mg by mouth daily.   02/01/2015 at Unknown time  . collagenase (SANTYL) ointment Apply topically daily. 15 g 0 02/01/2015 at Unknown time  . Fluticasone Furoate-Vilanterol (BREO ELLIPTA) 100-25 MCG/INH AEPB Inhale 1 puff into the lungs daily.   02/01/2015 at Unknown time  . Fluticasone-Salmeterol (ADVAIR) 250-50 MCG/DOSE AEPB Inhale 1 puff into the lungs 2 (two) times daily.   02/01/2015 at Unknown time  . furosemide (LASIX) 20 MG tablet Take 1 tablet (20 mg total) by mouth daily. 30 tablet 0 02/01/2015 at Unknown time  . gabapentin (NEURONTIN) 300 MG capsule 1 tab po at bedtime 1st day, 1 tablet bid second day, then 1 tablet tid 20 capsule 0 02/01/2015 at Unknown time  . isosorbide mononitrate (IMDUR) 30 MG 24 hr tablet Take 1 tablet (30 mg total) by mouth daily. 30 tablet 0 02/01/2015 at Unknown time  . nitroGLYCERIN (NITROSTAT) 0.4 MG SL tablet Place 0.4 mg under the tongue every 5 (five) minutes as needed for chest pain.   unknown at unknown  . traMADol (ULTRAM) 50 MG tablet Take 1 tablet (50 mg total) by mouth every 6 (six) hours as needed. (Patient taking differently: Take 50 mg by mouth every 6 (six) hours as needed for moderate pain. ) 15 tablet 0 unknown at unknown  . zolpidem (AMBIEN) 10 MG tablet Take 1 tablet (10 mg total) by mouth at bedtime as needed for sleep. 10 tablet 0 01/31/2015 at Unknown time  . [DISCONTINUED] diazepam (VALIUM) 10 MG tablet Take 1 tablet (10 mg total) by mouth every 12 (twelve) hours as needed for anxiety. 20  tablet 0 unknown at Unknown time  . [DISCONTINUED] HYDROmorphone (DILAUDID) 4 MG tablet Take 1 tablet (4 mg total) by mouth every 6 (six) hours as needed for severe pain. 20 tablet 0 02/01/2015 at unsure  . [DISCONTINUED] OxyCODONE (OXYCONTIN) 80 mg T12A 12 hr tablet Take 1 tablet (80 mg total) by mouth every 12 (twelve) hours. 10 tablet 0 02/01/2015 at unsure  . [DISCONTINUED] predniSONE (STERAPRED UNI-PAK 21 TAB) 10 MG (21) TBPK tablet Take 1 tablet (10 mg total) by mouth daily. 6 tabs PO x 1 day 5 tabs PO x 1 day 4 tabs PO x 1 day 3 tabs PO x 1 day 2 tabs PO x 1 day 1 tab PO x 1 day and stop 21 tablet 0 02/01/2015 at Unknown  time  . [DISCONTINUED] LORazepam (ATIVAN) 1 MG tablet Take 1 tablet (1 mg total) by mouth every 8 (eight) hours as needed for anxiety. (Patient not taking: Reported on 02/01/2015) 12 tablet 0 Past Week at Unknown time    Physical Exam: Blood pressure 114/52, pulse 78, temperature 98.4 F (36.9 C), temperature source Oral, resp. rate 17, height 5\' 4"  (1.626 m), weight 44.815 kg (98 lb 12.8 oz), SpO2 99 %.    General appearance: cooperative Head: Normocephalic, without obvious abnormality, atraumatic Back: symmetric, no curvature. ROM normal. No CVA tenderness. Resp: clear to auscultation bilaterally Chest wall: no tenderness Cardio: regular rate and rhythm, S1, S2 normal, no murmur, click, rub or gallop GI: soft, non-tender; bowel sounds normal; no masses,  no organomegaly Extremities: extremities normal, atraumatic, no cyanosis or edema Skin: Skin color, texture, turgor normal. No rashes or lesions Neurologic: Grossly normal Labs:   Lab Results  Component Value Date   WBC 11.3* 02/02/2015   HGB 10.9* 02/02/2015   HCT 35.4 02/02/2015   MCV 72.1* 02/02/2015   PLT 185 02/02/2015    Recent Labs Lab 02/01/15 1432 02/02/15 0334  NA 142 140  K 3.0* 4.3  CL 105 102  CO2 28 30  BUN 11 18  CREATININE 0.68 0.87  CALCIUM 8.6* 8.5*  PROT 6.4*  --   BILITOT 0.3  --    ALKPHOS 74  --   ALT 23  --   AST 19  --   GLUCOSE 111* 94   Lab Results  Component Value Date   CKTOTAL 20* 02/02/2015   TROPONINI 0.07* 02/02/2015      Radiology: no pulmonary edema EKG: nsr with no ischemia  ASSESSMENT AND PLAN:  Has multiple problems which appear stable. No evidence of further ischemia. Planning discharge today which i think is fine. WOuld conitnue with current meds.  Folow up with [facility]ology.  Signed: Teodoro Spray MD, [facility] 02/03/2015, 1:45 PM

## 2015-02-03 NOTE — Discharge Instructions (Signed)
Follow-up with primary care physician in 3 days Follow-up with [facility] cardiology in 2-3 days-needs further cardiac evaluation regarding syncope Resume home health

## 2015-02-03 NOTE — Care Management (Signed)
Notified [facility] of discharge and order requested from attending for resumption of care orders for home health

## 2015-02-03 NOTE — Progress Notes (Signed)
PT Cancellation Note  Patient Details Name: Lynn Butler MRN: [id number] DOB: [birth year]   Cancelled Treatment:    Reason Eval/Treat Not Completed: Patient declined, no reason specified (Consult received and chart reviewed.  Patient/family currently in room packing/preparing for discharge; declines PT evaluation at this time.  Patient reports mobilizing to/from bathroom in room without difficulty; feels near baseline and comfortable with mobility required upon discharge home.  Continued declination of PT services despite encouragement.  Will re-attempt next date should patient remain in facility.)   Harwood Nall H. Owens Shark, PT, DPT, NCS 02/03/2015, 1:32 PM [phone]

## 2015-02-03 NOTE — Clinical Social Work Note (Signed)
CSW acknowledges consult however pt stated that she did have transportation to [facility].  CSW signing off.

## 2015-02-06 ENCOUNTER — Encounter: Payer: Self-pay | Admitting: Emergency Medicine

## 2015-02-06 ENCOUNTER — Emergency Department: Payer: Medicare Other

## 2015-02-06 ENCOUNTER — Inpatient Hospital Stay
Admission: EM | Admit: 2015-02-06 | Discharge: 2015-02-11 | DRG: 871 | Payer: Medicare Other | Attending: Internal Medicine | Admitting: Internal Medicine

## 2015-02-06 DIAGNOSIS — Z681 Body mass index (BMI) 19 or less, adult: Secondary | ICD-10-CM | POA: Diagnosis not present

## 2015-02-06 DIAGNOSIS — Z79891 Long term (current) use of opiate analgesic: Secondary | ICD-10-CM | POA: Diagnosis not present

## 2015-02-06 DIAGNOSIS — F17210 Nicotine dependence, cigarettes, uncomplicated: Secondary | ICD-10-CM | POA: Diagnosis present

## 2015-02-06 DIAGNOSIS — Z7902 Long term (current) use of antithrombotics/antiplatelets: Secondary | ICD-10-CM | POA: Diagnosis not present

## 2015-02-06 DIAGNOSIS — J189 Pneumonia, unspecified organism: Secondary | ICD-10-CM | POA: Diagnosis present

## 2015-02-06 DIAGNOSIS — R0602 Shortness of breath: Secondary | ICD-10-CM

## 2015-02-06 DIAGNOSIS — I2510 Atherosclerotic heart disease of native coronary artery without angina pectoris: Secondary | ICD-10-CM | POA: Diagnosis present

## 2015-02-06 DIAGNOSIS — E785 Hyperlipidemia, unspecified: Secondary | ICD-10-CM | POA: Diagnosis present

## 2015-02-06 DIAGNOSIS — A419 Sepsis, unspecified organism: Secondary | ICD-10-CM | POA: Diagnosis present

## 2015-02-06 DIAGNOSIS — I10 Essential (primary) hypertension: Secondary | ICD-10-CM | POA: Diagnosis present

## 2015-02-06 DIAGNOSIS — J449 Chronic obstructive pulmonary disease, unspecified: Secondary | ICD-10-CM | POA: Diagnosis present

## 2015-02-06 DIAGNOSIS — Z955 Presence of coronary angioplasty implant and graft: Secondary | ICD-10-CM | POA: Diagnosis not present

## 2015-02-06 DIAGNOSIS — C4490 Unspecified malignant neoplasm of skin, unspecified: Secondary | ICD-10-CM | POA: Diagnosis present

## 2015-02-06 DIAGNOSIS — E43 Unspecified severe protein-calorie malnutrition: Secondary | ICD-10-CM | POA: Diagnosis present

## 2015-02-06 DIAGNOSIS — I739 Peripheral vascular disease, unspecified: Secondary | ICD-10-CM | POA: Diagnosis present

## 2015-02-06 DIAGNOSIS — Z452 Encounter for adjustment and management of vascular access device: Secondary | ICD-10-CM

## 2015-02-06 DIAGNOSIS — I252 Old myocardial infarction: Secondary | ICD-10-CM | POA: Diagnosis not present

## 2015-02-06 DIAGNOSIS — I5042 Chronic combined systolic (congestive) and diastolic (congestive) heart failure: Secondary | ICD-10-CM | POA: Diagnosis present

## 2015-02-06 DIAGNOSIS — Z8614 Personal history of Methicillin resistant Staphylococcus aureus infection: Secondary | ICD-10-CM | POA: Diagnosis not present

## 2015-02-06 LAB — CBC WITH DIFFERENTIAL/PLATELET
BASOS PCT: 1 %
Basophils Absolute: 0.1 10*3/uL (ref 0–0.1)
Eosinophils Absolute: 0.1 10*3/uL (ref 0–0.7)
Eosinophils Relative: 1 %
HEMATOCRIT: 34.8 % — AB (ref 35.0–47.0)
Hemoglobin: 10.9 g/dL — ABNORMAL LOW (ref 12.0–16.0)
LYMPHS PCT: 19 %
Lymphs Abs: 2.2 10*3/uL (ref 1.0–3.6)
MCH: 22.3 pg — ABNORMAL LOW (ref 26.0–34.0)
MCHC: 31.2 g/dL — ABNORMAL LOW (ref 32.0–36.0)
MCV: 71.5 fL — ABNORMAL LOW (ref 80.0–100.0)
MONO ABS: 0.9 10*3/uL (ref 0.2–0.9)
Monocytes Relative: 8 %
Neutro Abs: 8.3 10*3/uL — ABNORMAL HIGH (ref 1.4–6.5)
Neutrophils Relative %: 71 %
PLATELETS: 187 10*3/uL (ref 150–440)
RBC: 4.87 MIL/uL (ref 3.80–5.20)
RDW: 19.3 % — ABNORMAL HIGH (ref 11.5–14.5)
WBC: 11.6 10*3/uL — AB (ref 3.6–11.0)

## 2015-02-06 LAB — COMPREHENSIVE METABOLIC PANEL
ALBUMIN: 3.1 g/dL — AB (ref 3.5–5.0)
ALK PHOS: 64 U/L (ref 38–126)
ALT: 14 U/L (ref 14–54)
AST: 18 U/L (ref 15–41)
Anion gap: 7 (ref 5–15)
BUN: 18 mg/dL (ref 6–20)
CO2: 32 mmol/L (ref 22–32)
Calcium: 8.3 mg/dL — ABNORMAL LOW (ref 8.9–10.3)
Chloride: 97 mmol/L — ABNORMAL LOW (ref 101–111)
Creatinine, Ser: 0.72 mg/dL (ref 0.44–1.00)
GFR calc non Af Amer: >60 mL/min (ref >60)
GLUCOSE: 130 mg/dL — AB (ref 65–99)
Potassium: 3.9 mmol/L (ref 3.5–5.1)
SODIUM: 136 mmol/L (ref 135–145)
Total Bilirubin: 0.5 mg/dL (ref 0.3–1.2)
Total Protein: 5.8 g/dL — ABNORMAL LOW (ref 6.5–8.1)

## 2015-02-06 LAB — URINALYSIS COMPLETE WITH MICROSCOPIC (ARMC ONLY)
BACTERIA UA: NONE SEEN
BILIRUBIN URINE: NEGATIVE
Glucose, UA: NEGATIVE mg/dL
HGB URINE DIPSTICK: NEGATIVE
Ketones, ur: NEGATIVE mg/dL
LEUKOCYTES UA: NEGATIVE
Nitrite: NEGATIVE
PH: 7 (ref 5.0–8.0)
Protein, ur: NEGATIVE mg/dL
SPECIFIC GRAVITY, URINE: 1.015 (ref 1.005–1.030)
Squamous Epithelial / LPF: NONE SEEN

## 2015-02-06 LAB — APTT: aPTT: 31 seconds (ref 24–36)

## 2015-02-06 LAB — LIPASE, BLOOD: LIPASE: 52 U/L — AB (ref 22–51)

## 2015-02-06 LAB — PROTIME-INR
INR: 1.06
Prothrombin Time: 14 seconds (ref 11.4–15.0)

## 2015-02-06 LAB — TROPONIN I: Troponin I: 0.06 ng/mL — ABNORMAL HIGH (ref <0.031)

## 2015-02-06 LAB — LACTIC ACID, PLASMA: Lactic Acid, Venous: 0.8 mmol/L (ref 0.5–2.0)

## 2015-02-06 MED ORDER — ONDANSETRON HCL 4 MG/2ML IJ SOLN
4.0000 mg | INTRAMUSCULAR | Status: AC
  Administered 2015-02-06: 4 mg via INTRAVENOUS

## 2015-02-06 MED ORDER — VANCOMYCIN HCL IN DEXTROSE 1-5 GM/200ML-% IV SOLN
1000.0000 mg | Freq: Once | INTRAVENOUS | Status: AC
  Administered 2015-02-06: 1000 mg via INTRAVENOUS

## 2015-02-06 MED ORDER — ACETAMINOPHEN 325 MG PO TABS
650.0000 mg | ORAL_TABLET | Freq: Once | ORAL | Status: AC
  Administered 2015-02-06: 650 mg via ORAL

## 2015-02-06 MED ORDER — ONDANSETRON HCL 4 MG/2ML IJ SOLN
INTRAMUSCULAR | Status: AC
  Administered 2015-02-06: 4 mg via INTRAVENOUS
  Filled 2015-02-06: qty 2

## 2015-02-06 MED ORDER — CLOPIDOGREL BISULFATE 75 MG PO TABS
75.0000 mg | ORAL_TABLET | Freq: Every day | ORAL | Status: DC
  Administered 2015-02-07 – 2015-02-11 (×5): 75 mg via ORAL
  Filled 2015-02-06 (×5): qty 1

## 2015-02-06 MED ORDER — VANCOMYCIN HCL 500 MG IV SOLR
500.0000 mg | Freq: Two times a day (BID) | INTRAVENOUS | Status: DC
  Administered 2015-02-07 – 2015-02-08 (×4): 500 mg via INTRAVENOUS
  Filled 2015-02-06 (×8): qty 500

## 2015-02-06 MED ORDER — ISOSORBIDE MONONITRATE ER 30 MG PO TB24
30.0000 mg | ORAL_TABLET | Freq: Every day | ORAL | Status: DC
  Administered 2015-02-07 – 2015-02-11 (×5): 30 mg via ORAL
  Filled 2015-02-06 (×5): qty 1

## 2015-02-06 MED ORDER — SODIUM CHLORIDE 0.9 % IJ SOLN
3.0000 mL | Freq: Two times a day (BID) | INTRAMUSCULAR | Status: DC
  Administered 2015-02-06 – 2015-02-09 (×5): 3 mL via INTRAVENOUS

## 2015-02-06 MED ORDER — HYDROMORPHONE HCL 2 MG PO TABS
ORAL_TABLET | ORAL | Status: AC
  Administered 2015-02-06: 4 mg via ORAL
  Filled 2015-02-06: qty 2

## 2015-02-06 MED ORDER — SODIUM CHLORIDE 0.9 % IV BOLUS (SEPSIS)
500.0000 mL | INTRAVENOUS | Status: AC
  Administered 2015-02-06: 500 mL via INTRAVENOUS

## 2015-02-06 MED ORDER — MOMETASONE FURO-FORMOTEROL FUM 100-5 MCG/ACT IN AERO
2.0000 | INHALATION_SPRAY | Freq: Two times a day (BID) | RESPIRATORY_TRACT | Status: DC
  Administered 2015-02-07 – 2015-02-11 (×10): 2 via RESPIRATORY_TRACT
  Filled 2015-02-06 (×3): qty 8.8

## 2015-02-06 MED ORDER — MORPHINE SULFATE 4 MG/ML IJ SOLN
4.0000 mg | Freq: Once | INTRAMUSCULAR | Status: AC
  Administered 2015-02-06: 4 mg via INTRAVENOUS

## 2015-02-06 MED ORDER — OXYCODONE HCL ER 20 MG PO T12A
80.0000 mg | EXTENDED_RELEASE_TABLET | Freq: Two times a day (BID) | ORAL | Status: DC
  Administered 2015-02-07 – 2015-02-11 (×10): 80 mg via ORAL
  Filled 2015-02-06 (×2): qty 4
  Filled 2015-02-06 (×2): qty 2
  Filled 2015-02-06: qty 4
  Filled 2015-02-06 (×5): qty 2

## 2015-02-06 MED ORDER — ACETAMINOPHEN 325 MG PO TABS
ORAL_TABLET | ORAL | Status: AC
  Administered 2015-02-06: 650 mg via ORAL
  Filled 2015-02-06: qty 2

## 2015-02-06 MED ORDER — ACETAMINOPHEN 650 MG RE SUPP: 650.0000 mg | Freq: Four times a day (QID) | RECTAL | Status: DC | PRN

## 2015-02-06 MED ORDER — PIPERACILLIN-TAZOBACTAM 3.375 G IVPB
3.3750 g | Freq: Three times a day (TID) | INTRAVENOUS | Status: DC
  Administered 2015-02-07 – 2015-02-11 (×13): 3.375 g via INTRAVENOUS
  Filled 2015-02-06 (×18): qty 50

## 2015-02-06 MED ORDER — LORAZEPAM 1 MG PO TABS
1.0000 mg | ORAL_TABLET | Freq: Three times a day (TID) | ORAL | Status: DC | PRN
  Administered 2015-02-07 – 2015-02-08 (×2): 1 mg via ORAL
  Filled 2015-02-06 (×2): qty 1

## 2015-02-06 MED ORDER — ALBUTEROL SULFATE (2.5 MG/3ML) 0.083% IN NEBU: 3.0000 mL | INHALATION_SOLUTION | RESPIRATORY_TRACT | Status: DC | PRN

## 2015-02-06 MED ORDER — VANCOMYCIN HCL IN DEXTROSE 1-5 GM/200ML-% IV SOLN
INTRAVENOUS | Status: AC
  Administered 2015-02-06: 1000 mg via INTRAVENOUS
  Filled 2015-02-06: qty 200

## 2015-02-06 MED ORDER — HYDROMORPHONE HCL 2 MG PO TABS
4.0000 mg | ORAL_TABLET | Freq: Four times a day (QID) | ORAL | Status: DC | PRN
  Administered 2015-02-06 – 2015-02-11 (×10): 4 mg via ORAL
  Filled 2015-02-06 (×9): qty 2

## 2015-02-06 MED ORDER — PIPERACILLIN-TAZOBACTAM 3.375 G IVPB 30 MIN
3.3750 g | Freq: Once | INTRAVENOUS | Status: AC
  Administered 2015-02-06: 3.375 g via INTRAVENOUS

## 2015-02-06 MED ORDER — ATORVASTATIN CALCIUM 20 MG PO TABS
40.0000 mg | ORAL_TABLET | Freq: Every day | ORAL | Status: DC
Start: 2015-02-07 — End: 2015-02-11
  Administered 2015-02-07 – 2015-02-11 (×5): 40 mg via ORAL
  Filled 2015-02-06 (×5): qty 2

## 2015-02-06 MED ORDER — BENZONATATE 100 MG PO CAPS
200.0000 mg | ORAL_CAPSULE | Freq: Three times a day (TID) | ORAL | Status: DC | PRN
  Administered 2015-02-07 – 2015-02-11 (×4): 200 mg via ORAL
  Filled 2015-02-06 (×4): qty 2

## 2015-02-06 MED ORDER — ONDANSETRON HCL 4 MG PO TABS: 4.0000 mg | ORAL_TABLET | Freq: Four times a day (QID) | ORAL | Status: DC | PRN

## 2015-02-06 MED ORDER — PIPERACILLIN-TAZOBACTAM 3.375 G IVPB
INTRAVENOUS | Status: AC
  Administered 2015-02-06: 3.375 g via INTRAVENOUS
  Filled 2015-02-06: qty 50

## 2015-02-06 MED ORDER — IPRATROPIUM-ALBUTEROL 0.5-2.5 (3) MG/3ML IN SOLN
3.0000 mL | Freq: Four times a day (QID) | RESPIRATORY_TRACT | Status: DC | PRN
  Administered 2015-02-07 – 2015-02-10 (×3): 3 mL via RESPIRATORY_TRACT
  Filled 2015-02-06 (×2): qty 3

## 2015-02-06 MED ORDER — ACETAMINOPHEN 325 MG PO TABS
650.0000 mg | ORAL_TABLET | Freq: Four times a day (QID) | ORAL | Status: DC | PRN
  Administered 2015-02-11: 650 mg via ORAL
  Filled 2015-02-06: qty 2

## 2015-02-06 MED ORDER — SODIUM CHLORIDE 0.9 % IV BOLUS (SEPSIS)
1000.0000 mL | Freq: Once | INTRAVENOUS | Status: AC
  Administered 2015-02-06: 1000 mL via INTRAVENOUS

## 2015-02-06 MED ORDER — ONDANSETRON HCL 4 MG/2ML IJ SOLN: 4.0000 mg | Freq: Four times a day (QID) | INTRAMUSCULAR | Status: DC | PRN

## 2015-02-06 MED ORDER — MORPHINE SULFATE 4 MG/ML IJ SOLN
INTRAMUSCULAR | Status: AC
  Administered 2015-02-06: 4 mg via INTRAVENOUS
  Filled 2015-02-06: qty 1

## 2015-02-06 MED ORDER — ENOXAPARIN SODIUM 40 MG/0.4ML ~~LOC~~ SOLN
40.0000 mg | SUBCUTANEOUS | Status: DC
Start: 2015-02-07 — End: 2015-02-11
  Administered 2015-02-07 – 2015-02-11 (×5): 40 mg via SUBCUTANEOUS
  Filled 2015-02-06 (×5): qty 0.4

## 2015-02-06 NOTE — ED Notes (Signed)
Dr Karma Greaser notified of Troponin - 0.06.  No new orders received.

## 2015-02-06 NOTE — ED Notes (Signed)
Report called to floor, given to Iris.

## 2015-02-06 NOTE — H&P (Signed)
[facility] [place] NAME: Quinette Hentges    MR#:  [id number]  DATE OF BIRTH:  1945/10/15  DATE OF ADMISSION:  02/06/2015  PRIMARY CARE PHYSICIAN: Juanell Fairly, MD   REQUESTING/REFERRING PHYSICIAN: Karma Greaser  CHIEF COMPLAINT:   Chief Complaint  Patient presents with  . Respiratory Distress    HISTORY OF PRESENT ILLNESS:  Capricia Serda  is a 69 y.o. female who presents with acute progressive shortness of breath. Patient states that she began to develop more shortness of breath today with a significantly increased cough. She also states that she felt like she's been having some fevers at home. She has chronic rest or issues at baseline, but states that her shortness of breath increased significantly today to the point that she needed to come to the ED. She states that she tried using her home nebulizers and inhalers, but that nothing was making her breathing better. She has recently been admitted to the hospital multiple times for cardiac issues including multiple myocardial infarctions. On evaluation in the ED she was found to meet SIRS criteria, and on chest x-ray seems to have a new pneumonia. Hospitalists were called for admission for sepsis due to healthcare associated pneumonia.  PAST MEDICAL HISTORY:   Past Medical History  Diagnosis Date  . COPD (chronic obstructive pulmonary disease)   . Hypertension   . Ischemic cardiomyopathy     a.   . Coronary artery disease     a. s/p multiple stenting in 2005  . History of ventricular tachycardia   . PVD (peripheral vascular disease)   . PAD (peripheral artery disease)   . HLD (hyperlipidemia)   . Chronic back pain   . MI, old   . MRSA (methicillin resistant staph aureus) culture positive     left foot wound  . CHF (congestive heart failure)     PAST SURGICAL HISTORY:   Past Surgical History  Procedure Laterality Date  . Insert / replace / remove pacemaker    . Vascular  bypass surgery Bilateral   . Cardiac catheterization N/A 01/14/2015    Procedure: Left Heart Cath;  Surgeon: Wellington Hampshire, MD;  Location: [facility];  Service: Cardiovascular;  Laterality: N/A;  . Cardiac defibrillator placement    . Appendectomy    . Cesarean section    . Abdominal surgery      SOCIAL HISTORY:   History  Substance Use Topics  . Smoking status: Current Every Day Smoker -- 0.50 packs/day    Types: Cigarettes  . Smokeless tobacco: Not on file  . Alcohol Use: No    FAMILY HISTORY:   Family History  Problem Relation Age of Onset  . CAD Other   . Breast cancer Mother   . Heart disease Mother   . Coronary artery disease Father     DRUG ALLERGIES:   Allergies  Allergen Reactions  . Nsaids Nausea And Vomiting and Other (See Comments)    Reaction:  GI distress and burning     MEDICATIONS AT HOME:   Prior to Admission medications   Medication Sig Start Date End Date Taking? Authorizing Provider  albuterol (PROAIR HFA) 108 (90 BASE) MCG/ACT inhaler Inhale 1-2 puffs into the lungs every 4 (four) hours as needed for wheezing or shortness of breath. 07/28/14   Marijean Heath, NP  albuterol (PROVENTIL) (2.5 MG/3ML) 0.083% nebulizer solution Take 3 mLs (2.5 mg total) by nebulization every 6 (six) hours as needed  for wheezing or shortness of breath. 01/17/15   Gladstone Lighter, MD  amLODipine (NORVASC) 5 MG tablet Take 5 mg by mouth daily.    Historical Provider, MD  atorvastatin (LIPITOR) 40 MG tablet Take 1 tablet (40 mg total) by mouth daily at 6 PM. 01/17/15   Gladstone Lighter, MD  carvedilol (COREG) 3.125 MG tablet Take 1 tablet (3.125 mg total) by mouth 2 (two) times daily with a meal. 01/17/15   Gladstone Lighter, MD  clopidogrel (PLAVIX) 75 MG tablet Take 75 mg by mouth daily.    Historical Provider, MD  collagenase (SANTYL) ointment Apply topically daily. 12/19/14   Dustin Flock, MD  diazepam (VALIUM) 10 MG tablet Take 1 tablet (10 mg total)  by mouth every 12 (twelve) hours as needed for anxiety. 02/03/15   Nicholes Mango, MD  feeding supplement, ENSURE ENLIVE, (ENSURE ENLIVE) LIQD Take 237 mLs by mouth 3 (three) times daily with meals. 02/03/15   Nicholes Mango, MD  Fluticasone Furoate-Vilanterol (BREO ELLIPTA) 100-25 MCG/INH AEPB Inhale 1 puff into the lungs daily.    Historical Provider, MD  Fluticasone-Salmeterol (ADVAIR) 250-50 MCG/DOSE AEPB Inhale 1 puff into the lungs 2 (two) times daily.    Historical Provider, MD  furosemide (LASIX) 20 MG tablet Take 1 tablet (20 mg total) by mouth daily. 01/22/15   Hillary Bow, MD  gabapentin (NEURONTIN) 300 MG capsule 1 tab po at bedtime 1st day, 1 tablet bid second day, then 1 tablet tid 01/09/15   Melynda Ripple, MD  HYDROmorphone (DILAUDID) 4 MG tablet Take 1 tablet (4 mg total) by mouth every 6 (six) hours as needed for severe pain. 02/03/15   Nicholes Mango, MD  ipratropium-albuterol (DUONEB) 0.5-2.5 (3) MG/3ML SOLN Take 3 mLs by nebulization every 6 (six) hours as needed. 02/03/15   Nicholes Mango, MD  isosorbide mononitrate (IMDUR) 30 MG 24 hr tablet Take 1 tablet (30 mg total) by mouth daily. 01/22/15   Hillary Bow, MD  levofloxacin (LEVAQUIN) 250 MG tablet Take 1 tablet (250 mg total) by mouth at bedtime. 02/03/15   Nicholes Mango, MD  LORazepam (ATIVAN) 1 MG tablet Take 1 tablet (1 mg total) by mouth every 8 (eight) hours as needed for anxiety. 02/03/15   Nicholes Mango, MD  nitroGLYCERIN (NITROSTAT) 0.4 MG SL tablet Place 0.4 mg under the tongue every 5 (five) minutes as needed for chest pain.    Historical Provider, MD  OxyCODONE (OXYCONTIN) 80 mg T12A 12 hr tablet Take 1 tablet (80 mg total) by mouth every 12 (twelve) hours. 02/03/15   Nicholes Mango, MD  predniSONE (STERAPRED UNI-PAK 21 TAB) 10 MG (21) TBPK tablet Take 1 tablet (10 mg total) by mouth daily. 6 tabs PO x 1 day 5 tabs PO x 1 day 4 tabs PO x 1 day 3 tabs PO x 1 day 2 tabs PO x 1 day 1 tab PO x 1 day and stop 02/03/15   Nicholes Mango, MD  traMADol  (ULTRAM) 50 MG tablet Take 1 tablet (50 mg total) by mouth every 6 (six) hours as needed. Patient taking differently: Take 50 mg by mouth every 6 (six) hours as needed for moderate pain.  01/09/15   Melynda Ripple, MD  zolpidem (AMBIEN) 10 MG tablet Take 1 tablet (10 mg total) by mouth at bedtime as needed for sleep. 01/17/15   Gladstone Lighter, MD    REVIEW OF SYSTEMS:  Review of Systems  Constitutional: Positive for fever (subjective at home, true fever documented here in the ED).  Negative for chills, weight loss and malaise/fatigue.  HENT: Negative for ear pain, hearing loss and tinnitus.   Eyes: Negative for blurred vision, double vision, pain and redness.  Respiratory: Positive for cough, shortness of breath and wheezing. Negative for hemoptysis.   Cardiovascular: Negative for chest pain, palpitations, orthopnea and leg swelling.  Gastrointestinal: Positive for nausea. Negative for vomiting, abdominal pain, diarrhea and constipation.  Genitourinary: Negative for dysuria, frequency and hematuria.  Musculoskeletal: Negative for back pain, joint pain and neck pain.  Skin:       No acne, rash, or lesions  Neurological: Negative for dizziness, tremors, focal weakness and weakness.  Endo/Heme/Allergies: Negative for polydipsia. Does not bruise/bleed easily.  Psychiatric/Behavioral: Negative for depression. The patient is not nervous/anxious and does not have insomnia.      VITAL SIGNS:   Filed Vitals:   02/06/15 2030 02/06/15 2100 02/06/15 2130 02/06/15 2214  BP: 110/56 101/52 103/55   Pulse: 92 88 88   Temp:    99.9 F (37.7 C)  TempSrc:    Axillary  Resp: 22 23 19    Height:      Weight:      SpO2: 96% 96% 97%    Wt Readings from Last 3 Encounters:  02/06/15 44.453 kg (98 lb)  02/03/15 44.815 kg (98 lb 12.8 oz)  01/22/15 44.77 kg (98 lb 11.2 oz)    PHYSICAL EXAMINATION:  Physical Exam  Constitutional: She is oriented to person, place, and time. She appears well-developed  and well-nourished. No distress.  HENT:  Head: Normocephalic and atraumatic.  Mouth/Throat: Oropharynx is clear and moist.  Eyes: Conjunctivae and EOM are normal. Pupils are equal, round, and reactive to light. No scleral icterus.  Neck: Normal range of motion. Neck supple. No JVD present. No thyromegaly present.  Cardiovascular: Normal rate, regular rhythm and intact distal pulses.  Exam reveals no gallop and no friction rub.   No murmur heard. Respiratory: Effort normal. No respiratory distress. She has wheezes. She has no rales.  Diffuse wheezing and coarse breath sounds, worst in the right base  GI: Soft. Bowel sounds are normal. She exhibits no distension. There is no tenderness.  Musculoskeletal: Normal range of motion. She exhibits no edema.  No arthritis, no gout  Lymphadenopathy:    She has no cervical adenopathy.  Neurological: She is alert and oriented to person, place, and time. No cranial nerve deficit.  No dysarthria, no aphasia  Skin: Skin is warm and dry. No rash noted. No erythema.  Left lower extremity ankle ulcer, healing  Psychiatric: She has a normal mood and affect. Her behavior is normal. Judgment and thought content normal.    LABORATORY PANEL:   CBC  Recent Labs Lab 02/06/15 1909  WBC 11.6*  HGB 10.9*  HCT 34.8*  PLT 187   ------------------------------------------------------------------------------------------------------------------  Chemistries   Recent Labs Lab 02/06/15 1909  NA 136  K 3.9  CL 97*  CO2 32  GLUCOSE 130*  BUN 18  CREATININE 0.72  CALCIUM 8.3*  AST 18  ALT 14  ALKPHOS 64  BILITOT 0.5   ------------------------------------------------------------------------------------------------------------------  Cardiac Enzymes  Recent Labs Lab 02/06/15 1909  TROPONINI 0.06*   ------------------------------------------------------------------------------------------------------------------  RADIOLOGY:  Dg Chest Port 1  View  02/06/2015   CLINICAL DATA:  Sepsis.  EXAM: PORTABLE CHEST - 1 VIEW  COMPARISON:  February 01, 2015.  FINDINGS: Stable cardiomediastinal silhouette. Left-sided pacemaker is unchanged in position. No pneumothorax is noted. Left lung is clear. New mild right basilar opacity  is noted concerning for pneumonia. No significant pleural effusion is noted. Bony thorax is intact.  IMPRESSION: New mild right basilar opacity concerning for pneumonia. Follow-up radiographs are recommended to ensure resolution.   Electronically Signed   By: Marijo Conception, M.D.   On: 02/06/2015 20:31    EKG:   Orders placed or performed during the hospital encounter of 02/06/15  . EKG 12-Lead  . EKG 12-Lead    IMPRESSION AND PLAN:  Principal Problem:   Sepsis - due to healthcare associated pneumonia, lactic acid okay, hemodynamically stable though borderline low blood pressure. Fluids given in the ED. Antibiotics started in the ED, continue these for now. Blood cultures and urine cultures sent, will order sputum culture. Treatment of other medical problems as below. Active Problems:   HCAP (healthcare-associated pneumonia) - antibiotics, sputum culture. Duo nebs when necessary. Antitussive medication   CAD (coronary artery disease) - continue appropriate home medications for this. Troponin almost down to normal after her recent myocardial infarctions.   HTN (hypertension) - on the lower side here, hold most home antihypertensives for now, to be restarted once blood pressure increases again.   Chronic combined systolic and diastolic CHF (congestive heart failure) - continue home meds for this, except for those which lower blood pressure. This can be restarted once her blood pressure improves.   COPD mixed type - continue home inhalers, when necessary duo nebs here.  All the records are reviewed and case discussed with ED provider. Management plans discussed with the patient and/or family.  DVT PROPHYLAXIS: SubQ  lovenox  ADMISSION STATUS: Inpatient  CODE STATUS: Full  TOTAL TIME TAKING CARE OF THIS PATIENT: 45 minutes.    Eriel Doyon Winchester 02/06/2015, 10:25 PM  [facility]  Office  [phone]  CC: Primary care physician; Juanell Fairly, MD

## 2015-02-06 NOTE — ED Provider Notes (Signed)
[facility] Emergency Department Provider Note  ____________________________________________  Time seen: Approximately 7:15 PM  I have reviewed the triage vital signs and the nursing notes.   HISTORY  Chief Complaint Respiratory Distress    HPI Lynn Butler is a 69 y.o. female with a complicated past medical history including an STEMI and even more recent admission for respiratory distress thought to be secondary to acute COPD.  She presents today with worsening shortness of breath, fever to greater than 102, and rapid heart rate.  She arrives by EMS and meets sepsis criteria based on her fever of 102.4, tachycardia at 105, and respiratory rate rate of 31.  She is alert and oriented and complains only of increasing shortness of breath, pain in her chest and abdomen, and persistent cough.  Her symptoms are severe.   Past Medical History  Diagnosis Date  . COPD (chronic obstructive pulmonary disease)   . Hypertension   . Ischemic cardiomyopathy     a.   . Coronary artery disease     a. s/p multiple stenting in 2005  . History of ventricular tachycardia   . PVD (peripheral vascular disease)   . PAD (peripheral artery disease)   . HLD (hyperlipidemia)   . Chronic back pain   . MI, old   . MRSA (methicillin resistant staph aureus) culture positive     left foot wound  . CHF (congestive heart failure)     Patient Active Problem List   Diagnosis Date Noted  . Chronic respiratory failure with hypoxia 02/01/2015  . Syncope and collapse 02/01/2015  . Chronic combined systolic and diastolic CHF (congestive heart failure) 02/01/2015  . Myocardial infarction in recovery phase 01/20/2015  . HTN (hypertension) 01/20/2015  . Acute MI 01/13/2015  . Chest pain 12/28/2014  . COPD exacerbation 12/16/2014  . Protein-calorie malnutrition, severe 07/22/2014  . Acute on chronic respiratory failure with hypoxemia 07/21/2014  . COPD mixed type 07/21/2014  . CAD  (coronary artery disease) 07/21/2014  . CHF (congestive heart failure) 07/21/2014  . PVD (peripheral vascular disease) 07/21/2014  . Leg ulcer 07/21/2014  . Skin lesion of face 07/21/2014  . Adult failure to thrive 07/21/2014  . Claudication of both lower extremities 07/21/2014  . Smoker 07/21/2014    Past Surgical History  Procedure Laterality Date  . Insert / replace / remove pacemaker    . Vascular bypass surgery Bilateral   . Cardiac catheterization N/A 01/14/2015    Procedure: Left Heart Cath;  Surgeon: Wellington Hampshire, MD;  Location: [place] [facility];  Service: Cardiovascular;  Laterality: N/A;  . Cardiac defibrillator placement    . Appendectomy      Current Outpatient Rx  Name  Route  Sig  Dispense  Refill  . albuterol (PROAIR HFA) 108 (90 BASE) MCG/ACT inhaler   Inhalation   Inhale 1-2 puffs into the lungs every 4 (four) hours as needed for wheezing or shortness of breath.         Marland Kitchen albuterol (PROVENTIL) (2.5 MG/3ML) 0.083% nebulizer solution   Nebulization   Take 3 mLs (2.5 mg total) by nebulization every 6 (six) hours as needed for wheezing or shortness of breath.   75 mL   0   . amLODipine (NORVASC) 5 MG tablet   Oral   Take 5 mg by mouth daily.         Marland Kitchen atorvastatin (LIPITOR) 40 MG tablet   Oral   Take 1 tablet (40 mg total) by mouth  daily at 6 PM.   30 tablet   0   . carvedilol (COREG) 3.125 MG tablet   Oral   Take 1 tablet (3.125 mg total) by mouth 2 (two) times daily with a meal.   60 tablet   2   . clopidogrel (PLAVIX) 75 MG tablet   Oral   Take 75 mg by mouth daily.         . collagenase (SANTYL) ointment   Topical   Apply topically daily.   15 g   0   . diazepam (VALIUM) 10 MG tablet   Oral   Take 1 tablet (10 mg total) by mouth every 12 (twelve) hours as needed for anxiety.   30 tablet   0   . diazepam (VALIUM) 10 MG tablet   Oral   Take 1 tablet (10 mg total) by mouth every 12 (twelve) hours as needed for anxiety.   10  tablet   0   . feeding supplement, ENSURE ENLIVE, (ENSURE ENLIVE) LIQD   Oral   Take 237 mLs by mouth 3 (three) times daily with meals.   237 mL   12   . Fluticasone Furoate-Vilanterol (BREO ELLIPTA) 100-25 MCG/INH AEPB   Inhalation   Inhale 1 puff into the lungs daily.         . Fluticasone-Salmeterol (ADVAIR) 250-50 MCG/DOSE AEPB   Inhalation   Inhale 1 puff into the lungs 2 (two) times daily.         . furosemide (LASIX) 20 MG tablet   Oral   Take 1 tablet (20 mg total) by mouth daily.   30 tablet   0   . gabapentin (NEURONTIN) 300 MG capsule      1 tab po at bedtime 1st day, 1 tablet bid second day, then 1 tablet tid   20 capsule   0   . HYDROmorphone (DILAUDID) 4 MG tablet   Oral   Take 1 tablet (4 mg total) by mouth every 6 (six) hours as needed for severe pain.   15 tablet   0   . ipratropium-albuterol (DUONEB) 0.5-2.5 (3) MG/3ML SOLN   Nebulization   Take 3 mLs by nebulization every 6 (six) hours as needed.   360 mL   0   . isosorbide mononitrate (IMDUR) 30 MG 24 hr tablet   Oral   Take 1 tablet (30 mg total) by mouth daily.   30 tablet   0   . levofloxacin (LEVAQUIN) 250 MG tablet   Oral   Take 1 tablet (250 mg total) by mouth at bedtime.   5 tablet   0   . LORazepam (ATIVAN) 1 MG tablet   Oral   Take 1 tablet (1 mg total) by mouth every 8 (eight) hours as needed for anxiety.   10 tablet   0   . nitroGLYCERIN (NITROSTAT) 0.4 MG SL tablet   Sublingual   Place 0.4 mg under the tongue every 5 (five) minutes as needed for chest pain.         . OxyCODONE (OXYCONTIN) 80 mg T12A 12 hr tablet   Oral   Take 1 tablet (80 mg total) by mouth every 12 (twelve) hours.   10 tablet   0   . predniSONE (STERAPRED UNI-PAK 21 TAB) 10 MG (21) TBPK tablet   Oral   Take 1 tablet (10 mg total) by mouth daily. 6 tabs PO x 1 day 5 tabs PO x 1 day 4 tabs PO x  1 day 3 tabs PO x 1 day 2 tabs PO x 1 day 1 tab PO x 1 day and stop   21 tablet   0   .  traMADol (ULTRAM) 50 MG tablet   Oral   Take 1 tablet (50 mg total) by mouth every 6 (six) hours as needed. Patient taking differently: Take 50 mg by mouth every 6 (six) hours as needed for moderate pain.    15 tablet   0   . zolpidem (AMBIEN) 10 MG tablet   Oral   Take 1 tablet (10 mg total) by mouth at bedtime as needed for sleep.   10 tablet   0     Allergies Nsaids  Family History  Problem Relation Age of Onset  . CAD Other   . Breast cancer Mother   . Heart disease Mother   . Coronary artery disease Father     Social History History  Substance Use Topics  . Smoking status: Current Every Day Smoker -- 0.50 packs/day    Types: Cigarettes  . Smokeless tobacco: Not on file  . Alcohol Use: No    Review of Systems Constitutional: Fever to 102.4 with chills Eyes: No visual changes. ENT: No sore throat. Cardiovascular: Her mental chest pain similar to prior Respiratory: Worsening shortness of breath 2 days Gastrointestinal: Or cyst and abdominal pain since surgery months ago.  No nausea, no vomiting.  No diarrhea.  No constipation. Genitourinary: Negative for dysuria. Musculoskeletal: Negative for back pain. Skin: Negative for rash. Neurological: Negative for headaches, focal weakness or numbness.  10-point ROS otherwise negative.  ____________________________________________   PHYSICAL EXAM:  VITAL SIGNS: ED Triage Vitals  Enc Vitals Group     BP 02/06/15 1900 131/67 mmHg     Pulse Rate 02/06/15 1900 105     Resp 02/06/15 1900 31     Temp 02/06/15 1900 102.4 F (39.1 C)     Temp Source 02/06/15 1900 Oral     SpO2 02/06/15 1900 91 %     Weight 02/06/15 1900 98 lb (44.453 kg)     Height 02/06/15 1900 5\' 4"  (1.626 m)     Head Cir --      Peak Flow --      Pain Score 02/06/15 1901 10     Pain Loc --      Pain Edu? --      Excl. in [place]? --     Constitutional: Alert and oriented.  Parents of chronic illness. Eyes: Conjunctivae are normal. PERRL.  EOMI. Head: Atraumatic.  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 Mouth/Throat: Mucous membranes are dry.  Oropharynx non-erythematous. Neck: No stridor.   Cardiovascular: Tachycardia, regular rhythm. Grossly normal heart sounds.  Good peripheral circulation. Respiratory: Increased respiratory rate, minimal retractions, coarse breath sounds throughout which are worse on the right, no wheezing Gastrointestinal: Soft with generalized, nonfocal tenderness throughout.  Well-appearing surgical wound on the abdomen with some localized redness/cellulitis. No distention. No abdominal bruits. No CVA tenderness. Musculoskeletal: No lower extremity tenderness nor edema.  No joint effusions. Neurologic:  Normal speech and language. No gross focal neurologic deficits are appreciated. Speech is normal. Skin:  Skin is warm, dry and intact. No rash noted.   ____________________________________________   LABS (all labs ordered are listed, but only abnormal results are displayed)  Labs Reviewed  COMPREHENSIVE METABOLIC PANEL - Abnormal; Notable for the following:    Chloride 97 (*)    Glucose, Bld 130 (*)  Calcium 8.3 (*)    Total Protein 5.8 (*)    Albumin 3.1 (*)    All other components within normal limits  CBC WITH DIFFERENTIAL/PLATELET - Abnormal; Notable for the following:    WBC 11.6 (*)    Hemoglobin 10.9 (*)    HCT 34.8 (*)    MCV 71.5 (*)    MCH 22.3 (*)    MCHC 31.2 (*)    RDW 19.3 (*)    Neutro Abs 8.3 (*)    All other components within normal limits  LIPASE, BLOOD - Abnormal; Notable for the following:    Lipase 52 (*)    All other components within normal limits  TROPONIN I - Abnormal; Notable for the following:    Troponin I 0.06 (*)    All other components within normal limits  BLOOD GAS, ARTERIAL - Abnormal; Notable for the following:    pH, Arterial 7.55 (*)    pO2, Arterial 154 (*)    Bicarbonate 35.0 (*)    Acid-Base Excess 11.6  (*)    All other components within normal limits  URINALYSIS COMPLETEWITH MICROSCOPIC ([facility] ONLY) - Abnormal; Notable for the following:    Color, Urine YELLOW (*)    APPearance CLEAR (*)    All other components within normal limits  CULTURE, BLOOD (ROUTINE X 2)  CULTURE, BLOOD (ROUTINE X 2)  URINE CULTURE  CULTURE, EXPECTORATED SPUTUM-ASSESSMENT  LACTIC ACID, PLASMA  APTT  PROTIME-INR  CBC  CREATININE, SERUM  BASIC METABOLIC PANEL  CBC   ____________________________________________  EKG  ED ECG REPORT I, Romen Yutzy, the attending physician, personally viewed and interpreted this ECG.   Date: 02/06/2015  EKG Time: 19:05  Rate: 99  Rhythm: normal sinus rhythm, borderline tachycardia  Axis: Left axis deviation  Intervals:Normal with occasional PVCs and LVH  ST&T Change: Non-specific ST segment / T-wave changes, but no evidence of acute ischemia.   ____________________________________________  RADIOLOGY  Dg Chest 2 View  02/01/2015   CLINICAL DATA:  Episodes of intermittent chest pain for the past 3 days, vomiting yesterday, syncopal episode today ; history of CHF with cardiomyopathy, COPD, current smoker  EXAM: CHEST  2 VIEW  COMPARISON:  PA and lateral chest x-ray of January 20, 2015  FINDINGS: The lungs are mildly hyperinflated and clear. There is a curvilinear line the projects in the right upper hemithorax beyond which lung markings are observed. This is not felt to reflect a pneumothorax. The heart and pulmonary vascularity are normal. The mediastinum is normal in width. There is no pleural effusion or pneumothorax. The permanent pacemaker defibrillator is in reasonable position radiographically. The bony thorax exhibits no acute abnormality.  IMPRESSION: COPD. There is no pneumonia nor CHF. A PA end-expiratory chest x-ray is recommended to assure that the atypical appearing pleural line in the right upper hemithorax does not reflect a pneumothorax.  These results were  called by by me by telephone at the time of interpretation on 02/01/2015 at 3:42 pm to Dr. Hinda Kehr , who verbally acknowledged these results.   Electronically Signed   By: David  Martinique M.D.   On: 02/01/2015 15:37   Dg Chest 2 View  01/20/2015   CLINICAL DATA:  Chest pain and shortness of breath for 24 hours.  EXAM: CHEST  2 VIEW  COMPARISON:  Single view of the chest 01/15/2015.  FINDINGS: There is cardiomegaly without edema. No pneumothorax identified. Very small bilateral pleural effusions are seen. AICD is in place.  IMPRESSION: Cardiomegaly and  very small bilateral pleural effusions. Negative for pulmonary edema.   Electronically Signed   By: Inge Rise M.D.   On: 01/20/2015 21:24   Dg Chest Port 1 View  02/01/2015   CLINICAL DATA:  Patient with repeat chest radiograph to evaluate for possible right pneumothorax.  EXAM: PORTABLE CHEST - 1 VIEW  COMPARISON:  Earlier same date chest radiograph.  FINDINGS: 2 additional views of the thorax were obtained. These demonstrate stable enlarged cardiac and mediastinal contours. Single lead AICD device projects over the left hemi thorax, stable in position. Lungs are clear. On repeat imaging with the patient out of the bed, there is no definite evidence for apical pneumothorax. Findings were likely secondary to overlapping soft tissues. No definite pleural effusion.  IMPRESSION: No definite apical pneumothorax. Previously described findings are likely secondary to skin fold.  Cardiomegaly.   Electronically Signed   By: Lovey Newcomer M.D.   On: 02/01/2015 18:13   Dg Chest Port 1 View  01/15/2015   CLINICAL DATA:  Acute onset of shortness of breath, wheezing and back pain. Initial encounter.  EXAM: PORTABLE CHEST - 1 VIEW  COMPARISON:  Chest radiograph performed 01/13/2015  FINDINGS: The lungs are well-aerated. There appears to be a stable 1.3 cm nodule at the right midlung zone, unchanged from 2012 and likely benign. Mild left basilar opacity likely reflects  atelectasis. No pleural effusion or pneumothorax is seen.  The cardiomediastinal silhouette is within normal limits. No acute osseous abnormalities are seen.  IMPRESSION: 1. Mild left basilar opacity likely reflects atelectasis. 2. Stable 1.3 cm nodule at the right midlung zone is unchanged from 2012 and likely benign.   Electronically Signed   By: Garald Balding M.D.   On: 01/15/2015 10:02   Dg Chest Port 1 View  01/13/2015   CLINICAL DATA:  Chest pain.  Initial encounter.  EXAM: PORTABLE CHEST - 1 VIEW  COMPARISON:  12/28/2014 and 12/16/2014 radiographs.  FINDINGS: 1833 hour. Left subclavian AICD appears unchanged at the right ventricular apex. The heart size and mediastinal contours are stable with aortic arch atherosclerosis. The lungs are hyperinflated. Patchy bibasilar opacities are unchanged, likely atelectasis. There is minimal subpleural density laterally in the right mid lung which appears unchanged, probably scarring. Right cervical rib noted.  IMPRESSION: No change from recent prior studies. Probable bibasilar atelectasis.   Electronically Signed   By: Richardean Sale M.D.   On: 01/13/2015 18:58   Dg Hip Unilat With Pelvis 1v Right  02/01/2015   CLINICAL DATA:  Hip pain.  Fall.  Initial evaluation.  EXAM: RIGHT HIP (WITH PELVIS) 1 VIEW  COMPARISON:  None.  FINDINGS: Diffuse severe osteopenia. Degenerative changes lumbar spine and both hips. No acute bony abnormality . Aortoiliac and iliofemoral atherosclerotic vascular calcifications present. Right femoral stent. Surgical clips noted over the inguinal regions.  IMPRESSION: 1. No acute bony abnormality.  No evidence of fracture dislocation. 2. Diffuse severe osteopenia degenerative change. 3. Peripheral vascular disease.   Electronically Signed   By: Marcello Moores  Register   On: 02/01/2015 15:34    ____________________________________________   PROCEDURES  Procedure(s) performed: None  Critical Care performed: Yes, see critical care  note(s)  CRITICAL CARE Performed by: Hinda Kehr  Total critical care time: 30 minutes  Critical care time was exclusive of separately billable procedures and treating other patients.  Critical care was necessary to treat or prevent imminent or life-threatening deterioration.  Critical care was time spent personally by me on the following activities: development  of treatment plan with patient and/or surrogate as well as nursing, discussions with consultants, evaluation of patient's response to treatment, examination of patient, obtaining history from patient or surrogate, ordering and performing treatments and interventions, ordering and review of laboratory studies, ordering and review of radiographic studies, pulse oximetry and re-evaluation of patient's condition.  ____________________________________________     INITIAL IMPRESSION / ASSESSMENT AND PLAN / ED COURSE  Pertinent labs & imaging results that were available during my care of the patient were reviewed by me and considered in my medical decision making (see chart for details).  I initiated code sepsis protocol based on her vital signs.  Given her increased difficulty with shortness of breath and productive cough pneumonia seemed the most likely source.  I started empiric treatment with vancomycin and Zosyn.  In spite of slightly increased respiratory effort the patient is in no acute respiratory distress at this time.   (Note that documentation was delayed due to multiple ED patients requiring immediate care.)   The patient has a new pneumonia in her right lung.  The choice of Zosyn and vancomycin should be appropriate.  She is receiving 30 mL/kg of normal saline fluids.  She meets sepsis criteria.  I discussed the case with the hospitalist who will admit her.  Her troponin is coming down from prior levels. ____________________________________________  FINAL CLINICAL IMPRESSION(S) / ED DIAGNOSES  Final diagnoses:  SOB  (shortness of breath)  Healthcare-associated pneumonia  Sepsis, due to unspecified organism      NEW MEDICATIONS STARTED DURING THIS VISIT:  New Prescriptions   No medications on file     Hinda Kehr, MD 02/07/15 [phone]

## 2015-02-06 NOTE — Progress Notes (Signed)
ANTIBIOTIC CONSULT NOTE - INITIAL  Pharmacy Consult for Vancomycin/Zosyn Indication: sepsis/HCAP  Allergies  Allergen Reactions  . Nsaids Nausea And Vomiting and Other (See Comments)    Reaction:  GI distress and burning     Patient Measurements: Height: 5\' 4"  (162.6 cm) Weight: 98 lb (44.453 kg) IBW/kg (Calculated) : 54.7   Vital Signs: Temp: 99.9 F (37.7 C) (07/10 2214) Temp Source: Axillary (07/10 2214) BP: 90/50 mmHg (07/10 2230) Pulse Rate: 86 (07/10 2230) Intake/Output from previous day:   Intake/Output from this shift:    Labs:  Recent Labs  02/06/15 1909  WBC 11.6*  HGB 10.9*  PLT 187  CREATININE 0.72   Estimated Creatinine Clearance: 47.3 mL/min (by C-G formula based on Cr of 0.72). No results for input(s): VANCOTROUGH, VANCOPEAK, VANCORANDOM, GENTTROUGH, GENTPEAK, GENTRANDOM, TOBRATROUGH, TOBRAPEAK, TOBRARND, AMIKACINPEAK, AMIKACINTROU, AMIKACIN in the last 72 hours.   Kinetics:   Ke: 0.044 Vd: 31.1   Microbiology: Recent Results (from the past 720 hour(s))  Culture, blood (routine x 2)     Status: None   Collection Time: 01/13/15  6:30 PM  Result Value Ref Range Status   Specimen Description BLOOD  Final   Special Requests BLOOD  Final   Culture NO GROWTH 5 DAYS  Final   Report Status 01/18/2015 FINAL  Final  Culture, blood (routine x 2)     Status: None   Collection Time: 01/13/15  8:30 PM  Result Value Ref Range Status   Specimen Description BLOOD  Final   Special Requests NONE  Final   Culture NO GROWTH 5 DAYS  Final   Report Status 01/18/2015 FINAL  Final    Medical History: Past Medical History  Diagnosis Date  . COPD (chronic obstructive pulmonary disease)   . Hypertension   . Ischemic cardiomyopathy     a.   . Coronary artery disease     a. s/p multiple stenting in 2005  . History of ventricular tachycardia   . PVD (peripheral vascular disease)   . PAD (peripheral artery disease)   . HLD (hyperlipidemia)   . Chronic back  pain   . MI, old   . MRSA (methicillin resistant staph aureus) culture positive     left foot wound  . CHF (congestive heart failure)     Medications:   (Not in a hospital admission) Scheduled:   Infusions:  . [START ON 02/07/2015] piperacillin-tazobactam (ZOSYN)  IV    . [START ON 02/07/2015] vancomycin     PRN: HYDROmorphone  Assessment: 69 y/o F with multiple recent admissions due to CAD admitted with sepsis due to HCAP.   Goal of Therapy:  Vancomycin trough level 15-20 mcg/ml  Plan:  1. Vancomycin 1000 mg iv once given in ED which is equivalent to a loading dose for this patient. Will begin 500 mg iv q 12 h and check a trough with the 5th dose which should be at or near steady-state.   2. Zosyn 3.375 g iv once over 30 minutes given in ED. Will order Zosyn 3.375 g EI q 8 h to begin 6 h after initial infusion.   Will f/u renal function and culture results.   Ulice Dash D 02/06/2015,11:09 PM

## 2015-02-06 NOTE — ED Notes (Signed)
Admitting MD in with patient. 

## 2015-02-06 NOTE — ED Notes (Signed)
Portable chest xray done.

## 2015-02-07 LAB — BLOOD GAS, ARTERIAL
Acid-Base Excess: 11.6 mmol/L — ABNORMAL HIGH (ref 0.0–3.0)
BICARBONATE: 35 meq/L — AB (ref 21.0–28.0)
FIO2: 0.4 %
O2 Saturation: 99.6 %
PCO2 ART: 40 mmHg (ref 32.0–48.0)
PH ART: 7.55 — AB (ref 7.350–7.450)
Patient temperature: 37
pO2, Arterial: 154 mmHg — ABNORMAL HIGH (ref 83.0–108.0)

## 2015-02-07 LAB — BASIC METABOLIC PANEL
Anion gap: 3 — ABNORMAL LOW (ref 5–15)
BUN: 14 mg/dL (ref 6–20)
CALCIUM: 7.3 mg/dL — AB (ref 8.9–10.3)
CO2: 31 mmol/L (ref 22–32)
Chloride: 107 mmol/L (ref 101–111)
Creatinine, Ser: 0.65 mg/dL (ref 0.44–1.00)
GFR calc non Af Amer: >60 mL/min (ref >60)
Glucose, Bld: 93 mg/dL (ref 65–99)
Potassium: 4.1 mmol/L (ref 3.5–5.1)
SODIUM: 141 mmol/L (ref 135–145)

## 2015-02-07 LAB — CBC
HCT: 29.1 % — ABNORMAL LOW (ref 35.0–47.0)
HEMOGLOBIN: 8.6 g/dL — AB (ref 12.0–16.0)
MCH: 21.6 pg — AB (ref 26.0–34.0)
MCHC: 29.6 g/dL — AB (ref 32.0–36.0)
MCV: 72.9 fL — ABNORMAL LOW (ref 80.0–100.0)
PLATELETS: 149 10*3/uL — AB (ref 150–440)
RBC: 4 MIL/uL (ref 3.80–5.20)
RDW: 18.9 % — AB (ref 11.5–14.5)
WBC: 6.8 10*3/uL (ref 3.6–11.0)

## 2015-02-07 LAB — MRSA PCR SCREENING: MRSA BY PCR: POSITIVE — AB

## 2015-02-07 MED ORDER — FUROSEMIDE 20 MG PO TABS
20.0000 mg | ORAL_TABLET | Freq: Every day | ORAL | Status: DC
  Administered 2015-02-08 – 2015-02-11 (×4): 20 mg via ORAL
  Filled 2015-02-07 (×4): qty 1

## 2015-02-07 MED ORDER — CHLORHEXIDINE GLUCONATE CLOTH 2 % EX PADS
6.0000 | MEDICATED_PAD | Freq: Every day | CUTANEOUS | Status: DC
  Administered 2015-02-08 – 2015-02-11 (×4): 6 via TOPICAL

## 2015-02-07 MED ORDER — FUROSEMIDE 10 MG/ML IJ SOLN
20.0000 mg | Freq: Once | INTRAMUSCULAR | Status: AC
  Administered 2015-02-07: 20 mg via INTRAVENOUS
  Filled 2015-02-07: qty 2

## 2015-02-07 MED ORDER — DM-GUAIFENESIN ER 30-600 MG PO TB12
1.0000 | ORAL_TABLET | Freq: Two times a day (BID) | ORAL | Status: DC
  Administered 2015-02-07: 1 via ORAL
  Filled 2015-02-07 (×3): qty 1

## 2015-02-07 MED ORDER — GUAIFENESIN ER 600 MG PO TB12
600.0000 mg | ORAL_TABLET | Freq: Two times a day (BID) | ORAL | Status: DC
  Administered 2015-02-07 – 2015-02-11 (×8): 600 mg via ORAL
  Filled 2015-02-07 (×8): qty 1

## 2015-02-07 MED ORDER — OXYCODONE-ACETAMINOPHEN 5-325 MG PO TABS
1.0000 | ORAL_TABLET | Freq: Four times a day (QID) | ORAL | Status: DC | PRN
  Administered 2015-02-07 – 2015-02-10 (×2): 1 via ORAL
  Filled 2015-02-07 (×2): qty 1

## 2015-02-07 MED ORDER — DEXTROMETHORPHAN POLISTIREX ER 30 MG/5ML PO SUER
30.0000 mg | Freq: Two times a day (BID) | ORAL | Status: DC
  Administered 2015-02-07 – 2015-02-11 (×7): 30 mg via ORAL
  Filled 2015-02-07 (×20): qty 5

## 2015-02-07 MED ORDER — MUPIROCIN 2 % EX OINT
1.0000 "application " | TOPICAL_OINTMENT | Freq: Two times a day (BID) | CUTANEOUS | Status: DC
  Administered 2015-02-07 – 2015-02-11 (×9): 1 via NASAL
  Filled 2015-02-07 (×4): qty 22

## 2015-02-07 MED ORDER — ENSURE ENLIVE PO LIQD
237.0000 mL | Freq: Three times a day (TID) | ORAL | Status: DC
  Administered 2015-02-07 – 2015-02-11 (×12): 237 mL via ORAL

## 2015-02-07 NOTE — Progress Notes (Signed)
Initial Nutrition Assessment  DOCUMENTATION CODES:  Severe malnutrition in context of chronic illness  INTERVENTION: Medical Food Supplement Therapy: recommend Ensure Enlive (each supplement provides 350kcal and 20 grams of protein) TID with meals Meals and Snacks: Cater to patient preferences; pt would benefit from smaller, more frequent meals. Discussed with pt; will send snacks between meals   NUTRITION DIAGNOSIS:  Inadequate oral intake related to chronic illness as evidenced by severe depletion of body fat, severe depletion of muscle mass, per patient/family report.  GOAL:  Patient will meet greater than or equal to 90% of their needs  MONITOR:   (Energy Intake, Anthropometrics, Digestive System, Electroltye/Renal profile)  REASON FOR ASSESSMENT:   (RD Screen, LOW BMI)    ASSESSMENT:  Pt admitted with sepsis due to pneumonia; noted pt with 7 admission in 6 months  PMHx:  Past Medical History  Diagnosis Date  . COPD (chronic obstructive pulmonary disease)   . Hypertension   . Ischemic cardiomyopathy     a.   . Coronary artery disease     a. s/p multiple stenting in 2005  . History of ventricular tachycardia   . PVD (peripheral vascular disease)   . PAD (peripheral artery disease)   . HLD (hyperlipidemia)   . Chronic back pain   . MI, old   . MRSA (methicillin resistant staph aureus) culture positive     left foot wound  . CHF (congestive heart failure)     Diet Order: Heart Healthy   Current Nutrition: Pt eating lunch on visit today, reports appetite comes and goes  Food/Nutrition-Related History: Pt reports appetite comes and goes  Medications: reviewed  Electrolyte/Renal Profile and Glucose Profile:   Recent Labs Lab 02/02/15 0334 02/06/15 1909 02/07/15 0517  NA 140 136 141  K 4.3 3.9 4.1  CL 102 97* 107  CO2 30 32 31  BUN 18 18 14   CREATININE 0.87 0.72 0.65  CALCIUM 8.5* 8.3* 7.3*  GLUCOSE 94 130* 93   Protein Profile:  Recent  Labs Lab 02/01/15 1432 02/06/15 1909  ALBUMIN 3.4* 3.1*     Nutrition-Focused Physical Exam Findings: Nutrition-Focused physical exam completed. Findings are severe fat depletion, severe muscle depletion, and no edema.      Weight Change: Noted weight relatively stable over the last several admissions; no recent wt loss; gradual wt loss over time as pt weighed 185 pounds 10 years ago and reports she has just gradually lost weight Anthropometrics:    Height:  Ht Readings from Last 1 Encounters:  02/06/15 5\' 4"  (1.626 m)    Weight:  Wt Readings from Last 1 Encounters:  02/07/15 103 lb 3.2 oz (46.811 kg)    Ideal Body Weight:     Wt Readings from Last 10 Encounters:  02/07/15 103 lb 3.2 oz (46.811 kg)  02/03/15 98 lb 12.8 oz (44.815 kg)  01/22/15 98 lb 11.2 oz (44.77 kg)  01/16/15 98 lb 14.4 oz (44.861 kg)  01/09/15 90 lb (40.824 kg)  12/30/14 97 lb 9.6 oz (44.271 kg)  12/16/14 89 lb 14.4 oz (40.778 kg)  11/26/14 107 lb 1.6 oz (48.58 kg)  07/28/14 102 lb 15.3 oz (46.7 kg)    BMI:  Body mass index is 17.71 kg/(m^2).  Estimated Nutritional Needs:  Kcal:  1523-1780 kcals (BEE 1060, 1.3AF, 1.1-1.3 IF)   Protein:  62-74 g (1.2-1.4 g/kg)   Fluid:  1620-1890 mL (30-35 ml/kg)   Skin:   skin lesion on forehead, stage I pressure ulcer  Diet Order:  Diet Heart Room service appropriate?: Yes; Fluid consistency:: Thin  MODERATE Care Level  [facility] MS, RD, LDN [phone] Pager

## 2015-02-07 NOTE — Care Management (Addendum)
This is patient's fifth admission since 11/2014.  She has been followed by [facility]  nurse, physical therapy and aide.  Agency says that they can no longer provide service to patient.  Agency stated patient discussed going to [facility] and that agency unable to locate patient for home visits.  Patient denies discussing a wish to go to [facility].  She does not qualify at present and she declines need for home hospice.  Says that she lives with her daughter [name] and so she does not have to be alone a lot- some days she stays with her friend- Merrily Pew at [place].  Patient does not remember the phone number. Patient has a cell phone but does not remember the phone number.  She wishes to continue with home health services.  Discussed the need to let agency know in advance of her location.  Se brings up that she also needs to call the agency to inform of her location.    At present do not have specific wound care orders but will need to obtain for home health to continue what is reported as 3 x weekly dressings.  She uses medicaid transportation.  Says her daughter will transport her home at discharge and verbalizes understanding to have her portable 02 brought to hospital for transport.  Referral  For home health SN PT and Aide called to [facility].  Both locations where patient stays is in [place]. Permanent residence is with her daughter. Left voicemail referral to [facility].

## 2015-02-07 NOTE — Progress Notes (Signed)
[place] at [place] NAME: Lynn Butler    MR#:  [id number]  DATE OF BIRTH:  02-Feb-1946  SUBJECTIVE:  CHIEF COMPLAINT:   Chief Complaint  Patient presents with  . Respiratory Distress    Feels little better today,. Have SOB. REVIEW OF SYSTEMS:  CONSTITUTIONAL: No fever, fatigue or weakness.  EYES: No blurred or double vision.  EARS, NOSE, AND THROAT: No tinnitus or ear pain.  RESPIRATORY: positive for cough, shortness of breath, wheezing, no hemoptysis.  CARDIOVASCULAR: No chest pain, orthopnea, edema.  GASTROINTESTINAL: No nausea, vomiting, diarrhea or abdominal pain.  GENITOURINARY: No dysuria, hematuria.  ENDOCRINE: No polyuria, nocturia,  HEMATOLOGY: No anemia, easy bruising or bleeding SKIN: No rash or lesion. MUSCULOSKELETAL: No joint pain or arthritis.   NEUROLOGIC: No tingling, numbness, weakness.  PSYCHIATRY: No anxiety or depression.   ROS  DRUG ALLERGIES:   Allergies  Allergen Reactions  . Nsaids Nausea And Vomiting and Other (See Comments)    Reaction:  GI distress and burning     VITALS:  Blood pressure 113/55, pulse 67, temperature 97.5 F (36.4 C), temperature source Oral, resp. rate 16, height 5\' 4"  (1.626 m), weight 46.811 kg (103 lb 3.2 oz), SpO2 99 %.  PHYSICAL EXAMINATION:  GENERAL:  69 y.o.-year-old patient lying in the bed with no acute distress.  EYES: Pupils equal, round, reactive to light and accommodation. No scleral icterus. Extraocular muscles intact.  HEENT: Head atraumatic, normocephalic. Oropharynx and nasopharynx clear.  NECK:  Supple, no jugular venous distention. No thyroid enlargement, no tenderness.  LUNGS: Normal breath sounds bilaterally, minimal wheezing and crepitation. No use of accessory muscles of respiration. Uses nasal canula oxygen. CARDIOVASCULAR: S1, S2 normal. No murmurs, rubs, or gallops.  ABDOMEN: Soft, nontender, nondistended. Bowel sounds present. No organomegaly  or mass. Around umbilicus- scar from previous surgery- is red and some tender. EXTREMITIES: No pedal edema, cyanosis, or clubbing.  NEUROLOGIC: Cranial nerves II through XII are intact. Muscle strength 5/5 in all extremities. Sensation intact. Gait not checked.  PSYCHIATRIC: The patient is alert and oriented x 3.  SKIN: No obvious rash, lesion, or ulcer.   Physical Exam LABORATORY PANEL:   CBC  Recent Labs Lab 02/07/15 0517  WBC 6.8  HGB 8.6*  HCT 29.1*  PLT 149*   ------------------------------------------------------------------------------------------------------------------  Chemistries   Recent Labs Lab 02/06/15 1909 02/07/15 0517  NA 136 141  K 3.9 4.1  CL 97* 107  CO2 32 31  GLUCOSE 130* 93  BUN 18 14  CREATININE 0.72 0.65  CALCIUM 8.3* 7.3*  AST 18  --   ALT 14  --   ALKPHOS 64  --   BILITOT 0.5  --    ------------------------------------------------------------------------------------------------------------------  Cardiac Enzymes  Recent Labs Lab 02/02/15 0334 02/06/15 1909  TROPONINI 0.07* 0.06*   ------------------------------------------------------------------------------------------------------------------  RADIOLOGY:  Dg Chest Port 1 View  02/06/2015   CLINICAL DATA:  Sepsis.  EXAM: PORTABLE CHEST - 1 VIEW  COMPARISON:  February 01, 2015.  FINDINGS: Stable cardiomediastinal silhouette. Left-sided pacemaker is unchanged in position. No pneumothorax is noted. Left lung is clear. New mild right basilar opacity is noted concerning for pneumonia. No significant pleural effusion is noted. Bony thorax is intact.  IMPRESSION: New mild right basilar opacity concerning for pneumonia. Follow-up radiographs are recommended to ensure resolution.   Electronically Signed   By: Marijo Conception, M.D.   On: 02/06/2015 20:31    ASSESSMENT AND PLAN:   *  Sepsis - due to healthcare associated pneumonia, lactic acid okay, hemodynamically stable though borderline low  blood pressure.    Vanc+ zosyn  Follow Blood cultures and urine cultures , get sputum culture due to hx of MRSA.  * HCAP (healthcare-associated pneumonia) - antibiotics, sputum culture. Duo nebs when necessary. Antitussive medication  * CAD (coronary artery disease) - continue appropriate home medications for this. Troponin almost down to normal after her recent myocardial infarctions. * HTN (hypertension) - on the lower side here, hold most home antihypertensives for now, to be restarted once blood pressure increases again. * Chronic combined systolic and diastolic CHF (congestive heart failure) - continue home meds for this, except for those which lower blood pressure. This can be restarted once her blood pressure improves. * COPD mixed type - continue home inhalers, when necessary duo nebs here.   All the records are reviewed and case discussed with Care Management/Social Workerr. Management plans discussed with the patient, family and they are in agreement.  CODE STATUS full  TOTAL TIME TAKING CARE OF THIS PATIENT: 35 minutes.   More than 50% of the visit was spent in counseling/coordination of care  POSSIBLE D/C IN 2-3 DAYS, DEPENDING ON CLINICAL CONDITION.   Vaughan Basta M.D on 02/07/2015   Between 7am to 6pm - Pager - [phone]  After 6pm go to [email or web address] - password [email or web address]  [place] [facility]  Office  [phone]  CC: Primary care physician; Juanell Fairly, MD

## 2015-02-08 MED ORDER — CEPASTAT 14.5 MG MT LOZG: 1.0000 | LOZENGE | OROMUCOSAL | Status: DC | PRN

## 2015-02-08 MED ORDER — LORAZEPAM 1 MG PO TABS
1.0000 mg | ORAL_TABLET | Freq: Four times a day (QID) | ORAL | Status: DC | PRN
  Administered 2015-02-08 – 2015-02-11 (×4): 1 mg via ORAL
  Filled 2015-02-08 (×4): qty 1

## 2015-02-08 MED ORDER — DIAZEPAM 5 MG PO TABS
5.0000 mg | ORAL_TABLET | Freq: Two times a day (BID) | ORAL | Status: DC
  Administered 2015-02-08 – 2015-02-11 (×6): 5 mg via ORAL
  Filled 2015-02-08 (×7): qty 1

## 2015-02-08 NOTE — Progress Notes (Signed)
[place] at [place] NAME: Lynn Butler    MR#:  [id number]  DATE OF BIRTH:  May 02, 1946  SUBJECTIVE:  CHIEF COMPLAINT:   Chief Complaint  Patient presents with  . Respiratory Distress    Feels little better today,. Have SOB. And bouts of cough. REVIEW OF SYSTEMS:  CONSTITUTIONAL: No fever, fatigue or weakness.  EYES: No blurred or double vision.  EARS, NOSE, AND THROAT: No tinnitus or ear pain.  RESPIRATORY: positive for cough, shortness of breath, wheezing, no hemoptysis.  CARDIOVASCULAR: No chest pain, orthopnea, edema.  GASTROINTESTINAL: No nausea, vomiting, diarrhea or abdominal pain.  GENITOURINARY: No dysuria, hematuria.  ENDOCRINE: No polyuria, nocturia,  HEMATOLOGY: No anemia, easy bruising or bleeding SKIN: No rash or lesion. MUSCULOSKELETAL: No joint pain or arthritis.   NEUROLOGIC: No tingling, numbness, weakness.  PSYCHIATRY: No anxiety or depression.   ROS  DRUG ALLERGIES:   Allergies  Allergen Reactions  . Nsaids Nausea And Vomiting and Other (See Comments)    Reaction:  GI distress and burning     VITALS:  Blood pressure 115/103, pulse 89, temperature 98.4 F (36.9 C), temperature source Oral, resp. rate 19, height 5\' 4"  (1.626 m), weight 45.859 kg (101 lb 1.6 oz), SpO2 96 %.  PHYSICAL EXAMINATION:  GENERAL:  69 y.o.-year-old patient lying in the bed with no acute distress. Severe malnutrition EYES: Pupils equal, round, reactive to light and accommodation. No scleral icterus. Extraocular muscles intact.  HEENT: Head atraumatic, normocephalic. Oropharynx and nasopharynx clear.  NECK:  Supple, no jugular venous distention. No thyroid enlargement, no tenderness.  LUNGS: Normal breath sounds bilaterally, minimal wheezing and crepitation. No use of accessory muscles of respiration. Uses nasal canula oxygen. CARDIOVASCULAR: S1, S2 normal. No murmurs, rubs, or gallops.  ABDOMEN: Soft, nontender,  nondistended. Bowel sounds present. No organomegaly or mass. Around umbilicus- scar from previous surgery- is red and some tender. EXTREMITIES: No pedal edema, cyanosis, or clubbing.  NEUROLOGIC: Cranial nerves II through XII are intact. Muscle strength 5/5 in all extremities. Sensation intact. Gait not checked.  PSYCHIATRIC: The patient is alert and oriented x 3.  SKIN: No obvious rash, lesion, or ulcer. On left forehead- a cancerous growth present on skin.  Physical Exam LABORATORY PANEL:   CBC  Recent Labs Lab 02/07/15 0517  WBC 6.8  HGB 8.6*  HCT 29.1*  PLT 149*   ------------------------------------------------------------------------------------------------------------------  Chemistries   Recent Labs Lab 02/06/15 1909 02/07/15 0517  NA 136 141  K 3.9 4.1  CL 97* 107  CO2 32 31  GLUCOSE 130* 93  BUN 18 14  CREATININE 0.72 0.65  CALCIUM 8.3* 7.3*  AST 18  --   ALT 14  --   ALKPHOS 64  --   BILITOT 0.5  --    ------------------------------------------------------------------------------------------------------------------  Cardiac Enzymes  Recent Labs Lab 02/02/15 0334 02/06/15 1909  TROPONINI 0.07* 0.06*   ------------------------------------------------------------------------------------------------------------------  RADIOLOGY:  Dg Chest Port 1 View  02/06/2015   CLINICAL DATA:  Sepsis.  EXAM: PORTABLE CHEST - 1 VIEW  COMPARISON:  February 01, 2015.  FINDINGS: Stable cardiomediastinal silhouette. Left-sided pacemaker is unchanged in position. No pneumothorax is noted. Left lung is clear. New mild right basilar opacity is noted concerning for pneumonia. No significant pleural effusion is noted. Bony thorax is intact.  IMPRESSION: New mild right basilar opacity concerning for pneumonia. Follow-up radiographs are recommended to ensure resolution.   Electronically Signed   By: Marijo Conception, M.D.  On: 02/06/2015 20:31    ASSESSMENT AND PLAN:   * Sepsis -  due to healthcare associated pneumonia, lactic acid okay now , hemodynamically stable though borderline low blood pressure.    Vanc+ zosyn  Follow Blood cultures and urine cultures , awaited sputum culture due to hx of MRSA.  * HCAP (healthcare-associated pneumonia) - antibiotics, sputum culture. Duo nebs when necessary. Antitussive medication  * CAD (coronary artery disease) - continue appropriate home medications for this. Troponin - non elevated. * HTN (hypertension) - on the lower side here, hold most home antihypertensives for now, to be restarted once blood pressure increases again. * Chronic combined systolic and diastolic CHF (congestive heart failure) - continue home meds for this, except for those which lower blood pressure. This can be restarted once her blood pressure improves. * COPD mixed type - continue home inhalers, when necessary duo nebs here. * severe malnutrition- Ensure * Skin cancer- she want to follow with oncologist here- will refer to clinic after discharge.  All the records are reviewed and case discussed with Care Management/Social Workerr. Management plans discussed with the patient, family and they are in agreement.  CODE STATUS full  TOTAL TIME TAKING CARE OF THIS PATIENT: 35 minutes.   More than 50% of the visit was spent in counseling/coordination of care  POSSIBLE D/C IN 2-3 DAYS, DEPENDING ON CLINICAL CONDITION.   Vaughan Basta M.D on 02/08/2015   Between 7am to 6pm - Pager - [phone]  After 6pm go to [email or web address] - password [email or web address] [facility]  [place] [facility]  Office  [phone]  CC: Primary care physician; Juanell Fairly, MD

## 2015-02-08 NOTE — Progress Notes (Addendum)
Took over care at 1700, pt is alert and oriented, up to bathroom, resting comfortably, and does not appear to be in distress. Per CNA, Edwena Blow, RN Amy was notified of elevated blood pressure at 12:00.

## 2015-02-08 NOTE — Clinical Documentation Improvement (Signed)
Possible Clinical Conditions?   Severe Malnutrition   Protein Calorie Malnutrition Severe Protein Calorie Malnutritio Other Condition Cannot clinically   Supporting Information:       Initial Nutrition Assessment by Esaw Dace, RD at 2015-02-20 2:45 PM DOCUMENTATION CODES:  Severe malnutrition in context of chronic illness   INTERVENTION:  Medical Food Supplement Therapy: recommend Ensure Enlive (each supplement provides 350kcal and 20 grams of protein) TID with meals  Meals and Snacks: Cater to patient preferences; pt would benefit from smaller, more frequent meals. Discussed with pt; will send snacks between meals   NUTRITION DIAGNOSIS: Inadequate oral intake related to chronic illness as evidenced by severe depletion of body fat, severe depletion of muscle mass, per patient/family report.   Thank You, Alessandra Grout, RN, BSN, CCDS,Clinical Documentation Specialist:  [phone]  [phone]=[facility]- Health Information Management

## 2015-02-09 LAB — EXPECTORATED SPUTUM ASSESSMENT W REFEX TO RESP CULTURE

## 2015-02-09 LAB — CBC
HCT: 31 % — ABNORMAL LOW (ref 35.0–47.0)
Hemoglobin: 9.3 g/dL — ABNORMAL LOW (ref 12.0–16.0)
MCH: 22.2 pg — ABNORMAL LOW (ref 26.0–34.0)
MCHC: 30.1 g/dL — ABNORMAL LOW (ref 32.0–36.0)
MCV: 73.5 fL — AB (ref 80.0–100.0)
PLATELETS: 178 10*3/uL (ref 150–440)
RBC: 4.21 MIL/uL (ref 3.80–5.20)
RDW: 18.5 % — ABNORMAL HIGH (ref 11.5–14.5)
WBC: 6.4 10*3/uL (ref 3.6–11.0)

## 2015-02-09 LAB — URINE CULTURE
Culture: NO GROWTH
Special Requests: NORMAL

## 2015-02-09 LAB — EXPECTORATED SPUTUM ASSESSMENT W GRAM STAIN, RFLX TO RESP C

## 2015-02-09 LAB — BASIC METABOLIC PANEL
Anion gap: 8 (ref 5–15)
BUN: 20 mg/dL (ref 6–20)
CALCIUM: 8.1 mg/dL — AB (ref 8.9–10.3)
CO2: 35 mmol/L — ABNORMAL HIGH (ref 22–32)
CREATININE: 0.78 mg/dL (ref 0.44–1.00)
Chloride: 96 mmol/L — ABNORMAL LOW (ref 101–111)
Glucose, Bld: 109 mg/dL — ABNORMAL HIGH (ref 65–99)
Potassium: 3.9 mmol/L (ref 3.5–5.1)
SODIUM: 139 mmol/L (ref 135–145)

## 2015-02-09 LAB — VANCOMYCIN, TROUGH: VANCOMYCIN TR: 9 ug/mL — AB (ref 10–20)

## 2015-02-09 MED ORDER — VANCOMYCIN HCL 500 MG IV SOLR
500.0000 mg | Freq: Three times a day (TID) | INTRAVENOUS | Status: DC
  Administered 2015-02-09 (×2): 500 mg via INTRAVENOUS
  Filled 2015-02-09 (×5): qty 500

## 2015-02-09 MED ORDER — ACETYLCYSTEINE 20 % IN SOLN
2.0000 mL | Freq: Two times a day (BID) | RESPIRATORY_TRACT | Status: DC
  Administered 2015-02-09 – 2015-02-10 (×3): 2 mL via RESPIRATORY_TRACT
  Filled 2015-02-09 (×5): qty 4

## 2015-02-09 MED ORDER — ACETYLCYSTEINE 10 % IN SOLN
4.0000 mL | Freq: Two times a day (BID) | RESPIRATORY_TRACT | Status: DC
  Filled 2015-02-09 (×3): qty 4

## 2015-02-09 MED ORDER — ALBUTEROL SULFATE (2.5 MG/3ML) 0.083% IN NEBU
3.0000 mL | INHALATION_SOLUTION | Freq: Four times a day (QID) | RESPIRATORY_TRACT | Status: DC
  Administered 2015-02-09 – 2015-02-11 (×5): 3 mL via RESPIRATORY_TRACT
  Filled 2015-02-09 (×7): qty 3

## 2015-02-09 MED ORDER — ACETYLCYSTEINE 20 % IN SOLN
4.0000 mL | Freq: Two times a day (BID) | RESPIRATORY_TRACT | Status: DC
  Filled 2015-02-09 (×2): qty 4

## 2015-02-09 NOTE — Progress Notes (Signed)
Pt was informed she would be moving to room 158 on 1 A downstairs.  Pt in agreement but tearful.  Nursing supervisor, Colletta Maryland went in to talk with patient as well.  Pt says she will let her family and loved ones know of her room change.

## 2015-02-09 NOTE — Progress Notes (Signed)
ANTIBIOTIC CONSULT NOTE - FOLLOW UP  Pharmacy Consult for Vancomycin  Indication: HCAP/sepsis  Allergies  Allergen Reactions  . Nsaids Nausea And Vomiting and Other (See Comments)    Reaction:  GI distress and burning     Patient Measurements: Height: 5\' 4"  (162.6 cm) Weight: 99 lb 4.2 oz (45.025 kg) IBW/kg (Calculated) : 54.7  Vital Signs: Temp: 98.1 F (36.7 C) (07/13 0534) BP: 128/71 mmHg (07/13 0534) Pulse Rate: 77 (07/13 0534) Intake/Output from previous day: 07/12 0701 - 07/13 0700 In: -  Out: 1250 [Urine:1250] Intake/Output from this shift:    Labs:  Recent Labs  02/06/15 1909 02/07/15 0517 02/09/15 0416  WBC 11.6* 6.8 6.4  HGB 10.9* 8.6* 9.3*  PLT 187 149* 178  CREATININE 0.72 0.65 0.78   Estimated Creatinine Clearance: 47.8 mL/min (by C-G formula based on Cr of 0.78).  Recent Labs  02/09/15 0826  [place] 9*     Microbiology: Recent Results (from the past 720 hour(s))  Culture, blood (routine x 2)     Status: None   Collection Time: 01/13/15  6:30 PM  Result Value Ref Range Status   Specimen Description BLOOD  Final   Special Requests BLOOD  Final   Culture NO GROWTH 5 DAYS  Final   Report Status 01/18/2015 FINAL  Final  Culture, blood (routine x 2)     Status: None   Collection Time: 01/13/15  8:30 PM  Result Value Ref Range Status   Specimen Description BLOOD  Final   Special Requests NONE  Final   Culture NO GROWTH 5 DAYS  Final   Report Status 01/18/2015 FINAL  Final  Culture, blood (routine x 2)     Status: None (Preliminary result)   Collection Time: 02/06/15  7:09 PM  Result Value Ref Range Status   Specimen Description BLOOD LEFT ARM  Final   Special Requests BOTTLES DRAWN AEROBIC AND ANAEROBIC AER10ML,ANA5ML  Final   Culture NO GROWTH 3 DAYS  Final   Report Status PENDING  Incomplete  Culture, blood (routine x 2)     Status: None (Preliminary result)   Collection Time: 02/06/15  7:42 PM  Result Value Ref Range Status   Specimen Description BLOOD RIGHT HAND  Final   Special Requests BOTTLES DRAWN AEROBIC AND ANAEROBIC  Final   Culture NO GROWTH 3 DAYS  Final   Report Status PENDING  Incomplete  Urine culture     Status: None (Preliminary result)   Collection Time: 02/06/15  9:16 PM  Result Value Ref Range Status   Specimen Description URINE, RANDOM  Final   Special Requests Normal  Final   Culture NO GROWTH <24 HRS  Final   Report Status PENDING  Incomplete  MRSA PCR Screening     Status: Abnormal   Collection Time: 02/07/15 12:15 PM  Result Value Ref Range Status   MRSA by PCR POSITIVE (A) NEGATIVE Final    Comment: CRITICAL RESULT CALLED TO, READ BACK BY AND VERIFIED WITH: SOUMOUN TOURE AT 1400 ON 02/07/15 VKB        The GeneXpert MRSA Assay (FDA approved for NASAL specimens only), is one component of a comprehensive MRSA colonization surveillance program. It is not intended to diagnose MRSA infection nor to guide or monitor treatment for MRSA infections.     Anti-infectives    Start     Dose/Rate Route Frequency Ordered Stop   02/07/15 0900  vancomycin (VANCOCIN) 500 mg in sodium chloride 0.9 % 100 mL IVPB  500 mg 100 mL/hr over 60 Minutes Intravenous Every 12 hours 02/06/15 2309     02/07/15 0230  piperacillin-tazobactam (ZOSYN) IVPB 3.375 g     3.375 g 12.5 mL/hr over 240 Minutes Intravenous Every 8 hours 02/06/15 2309     02/06/15 2015  piperacillin-tazobactam (ZOSYN) IVPB 3.375 g     3.375 g 100 mL/hr over 30 Minutes Intravenous  Once 02/06/15 2004 02/06/15 2100   02/06/15 2015  vancomycin (VANCOCIN) IVPB 1000 mg/200 mL premix     1,000 mg 200 mL/hr over 60 Minutes Intravenous  Once 02/06/15 2004 02/06/15 2207      Assessment: Pt had a trough of 9. No missed doses  Goal of Therapy:  Vancomycin trough level 15-20 mcg/ml  Plan:  Measure antibiotic drug levels at steady state Follow up culture results Decrease interval to q 8 hr  500mg  q 8hr hours- check trough 1/2 hour  before 4th dose  Kambree Krauss D Hema Lanza 02/09/2015,9:45 AM

## 2015-02-09 NOTE — Evaluation (Signed)
Physical Therapy Evaluation Patient Details Name: Lynn Butler MRN: [id number] DOB: 1946-02-17 Today's Date: 02/09/2015   History of Present Illness  69 yo female with onset of exacerbation of chronic lung disease and syncopal episode, with sepsis resulting from MRSA in L ankle.  PMHx:  COPD, cardiomyopathy, MI, CHF, chronic respiratory failure  Clinical Impression  Pt is more SOB with any exertion, with notable control of sat at 90% with supplemental O2.  Has a great deal of pain at 9/10 and nursing in to administer meds.  Will expect dc home as she is close to PLOF.    Follow Up Recommendations Home health PT;Supervision/Assistance - 24 hour    Equipment Recommendations  None recommended by PT    Recommendations for Other Services       Precautions / Restrictions Precautions Precautions: Fall (telemetry) Restrictions Weight Bearing Restrictions: No      Mobility  Bed Mobility Overal bed mobility: Needs Assistance Bed Mobility: Sit to Supine;Supine to Sit     Supine to sit: Min guard;Min assist Sit to supine: Min guard;Min assist   General bed mobility comments: Assist to scotto up in bed and to postion with pillows for SOB  Transfers Overall transfer level: Needs assistance Equipment used: Rolling walker (2 wheeled) Transfers: Sit to/from [facility] Sit to Stand: Min guard;Min assist Stand pivot transfers: Min guard;Min assist       General transfer comment: assist with hand placement and for  safety  Ambulation/Gait Ambulation/Gait assistance: Min guard;Min assist Ambulation Distance (Feet): 40 Feet Assistive device: Rolling walker (2 wheeled) Gait Pattern/deviations: Step-through pattern;Shuffle;Wide base of support;Trunk flexed;Drifts right/left Gait velocity: reduced Gait velocity interpretation: Below normal speed for age/gender General Gait Details: cues for safety and maneuvering which decreases as she becomes SOB  Stairs             Wheelchair Mobility    Modified Rankin (Stroke Patients Only)       Balance Overall balance assessment: Needs assistance Sitting-balance support: Feet supported Sitting balance-Leahy Scale: Fair     Standing balance support: Bilateral upper extremity supported Standing balance-Leahy Scale: Poor                               Pertinent Vitals/Pain Pain Assessment: 0-10 Pain Score: 9  Pain Location: ribs, legs, foot Pain Intervention(s): Patient requesting pain meds-RN notified;RN gave pain meds during session;Limited activity within patient's tolerance    Home Living Family/patient expects to be discharged to:: Private residence Living Arrangements: Non-relatives/Friends Available Help at Discharge: Family Type of Home: House Home Access: Stairs to enter Entrance Stairs-Rails: Right;Left;Can reach both Entrance Stairs-Number of Steps: 3 Home Layout: One level Home Equipment: Environmental consultant - 2 wheels;Walker - 4 wheels;Shower seat;Wheelchair - manual      Prior Function Level of Independence: Independent with assistive device(s)         Comments: Amb using walker at times.     Hand Dominance   Dominant Hand: Right    Extremity/Trunk Assessment   Upper Extremity Assessment: Generalized weakness           Lower Extremity Assessment: Generalized weakness      Cervical / Trunk Assessment: Normal  Communication   Communication: No difficulties;Other (comment) (Tends to get off topic easily)  Cognition Arousal/Alertness: Awake/alert Behavior During Therapy: WFL for tasks assessed/performed Overall Cognitive Status: Within Functional Limits for tasks assessed  General Comments General comments (skin integrity, edema, etc.): Pt is demonstrating some SOB immediately with gait and after walking on 4L O2 was 90% sat, with rapid recovery to 100% at siting rest.      Exercises        Assessment/Plan    PT  Assessment Patient needs continued PT services  PT Diagnosis Difficulty walking;Acute pain   PT Problem List Decreased strength;Decreased range of motion;Decreased activity tolerance;Decreased balance;Decreased mobility;Decreased coordination;Cardiopulmonary status limiting activity;Pain;Decreased skin integrity  PT Treatment Interventions DME instruction;Gait training;Stair training;Functional mobility training;Therapeutic activities;Therapeutic exercise;Balance training;Neuromuscular re-education;Patient/family education   PT Goals (Current goals can be found in the Care Plan section) Acute Rehab PT Goals Patient Stated Goal: to go home PT Goal Formulation: With patient Time For Goal Achievement: 02/23/15 Potential to Achieve Goals: Good    Frequency Min 2X/week   Barriers to discharge Inaccessible home environment Pt needs some hands on help to walk and transfer to chair initially    Co-evaluation               End of Session Equipment Utilized During Treatment: Gait belt;Oxygen Activity Tolerance: Patient tolerated treatment well;Patient limited by fatigue;Patient limited by pain Patient left: in bed;with call bell/phone within reach;with bed alarm set Nurse Communication: Mobility status         Time: 2229-7989 PT Time Calculation (min) (ACUTE ONLY): 39 min   Charges:   PT Evaluation $Initial PT Evaluation Tier I: 1 Procedure PT Treatments $Gait Training: 8-22 mins $Therapeutic Activity: 8-22 mins   PT G Codes:        [name] 02/11/15, 12:19 PM  [name], PT MS Acute Rehab Dept. Number: [facility] [phone] and [place] [phone]

## 2015-02-09 NOTE — Plan of Care (Signed)
Problem: Acute Rehab PT Goals(only PT should resolve) Goal: Pt Will Ambulate With O2 sats 90% or greater on O2 as ordered        

## 2015-02-09 NOTE — Progress Notes (Signed)
[place] at [place] NAME: Lynn Butler    MR#:  [id number]  DATE OF BIRTH:  [birth year]  SUBJECTIVE:  CHIEF COMPLAINT:   Chief Complaint  Patient presents with  . Respiratory Distress    Feels little better today, Have SOB. And bouts of cough.  REVIEW OF SYSTEMS:  CONSTITUTIONAL: No fever, fatigue or weakness.  EYES: No blurred or double vision.  EARS, NOSE, AND THROAT: No tinnitus or ear pain.  RESPIRATORY: positive for cough, shortness of breath, wheezing, no hemoptysis.  CARDIOVASCULAR: No chest pain, orthopnea, edema.  GASTROINTESTINAL: No nausea, vomiting, diarrhea or abdominal pain.  GENITOURINARY: No dysuria, hematuria.  ENDOCRINE: No polyuria, nocturia,  HEMATOLOGY: No anemia, easy bruising or bleeding SKIN: No rash or lesion. MUSCULOSKELETAL: No joint pain or arthritis.   NEUROLOGIC: No tingling, numbness, weakness.  PSYCHIATRY: No anxiety or depression.   ROS  DRUG ALLERGIES:   Allergies  Allergen Reactions  . Nsaids Nausea And Vomiting and Other (See Comments)    Reaction:  GI distress and burning     VITALS:  Blood pressure 128/71, pulse 77, temperature 98.1 F (36.7 C), temperature source Oral, resp. rate 19, height 5\' 4"  (1.626 m), weight 45.025 kg (99 lb 4.2 oz), SpO2 100 %.  PHYSICAL EXAMINATION:  GENERAL:  69 y.o.-year-old patient lying in the bed with no acute distress. Severe malnutrition EYES: Pupils equal, round, reactive to light and accommodation. No scleral icterus. Extraocular muscles intact.  HEENT: Head atraumatic, normocephalic. Oropharynx and nasopharynx clear.  NECK:  Supple, no jugular venous distention. No thyroid enlargement, no tenderness.  LUNGS: Normal breath sounds bilaterally, minimal wheezing and crepitation, coarse sounds. No use of accessory muscles of respiration. Uses nasal canula oxygen. CARDIOVASCULAR: S1, S2 normal. No murmurs, rubs, or gallops.  ABDOMEN: Soft,  nontender, nondistended. Bowel sounds present. No organomegaly or mass. Around umbilicus- scar from previous surgery- is red and some tender. EXTREMITIES: No pedal edema, cyanosis, or clubbing.  NEUROLOGIC: Cranial nerves II through XII are intact. Muscle strength 5/5 in all extremities. Sensation intact. Gait not checked.  PSYCHIATRIC: The patient is alert and oriented x 3.  SKIN: No obvious rash, lesion, or ulcer. On left forehead- a cancerous growth present on skin.  Physical Exam LABORATORY PANEL:   CBC  Recent Labs Lab 02/09/15 0416  WBC 6.4  HGB 9.3*  HCT 31.0*  PLT 178   ------------------------------------------------------------------------------------------------------------------  Chemistries   Recent Labs Lab 02/06/15 1909  02/09/15 0416  NA 136  < > 139  K 3.9  < > 3.9  CL 97*  < > 96*  CO2 32  < > 35*  GLUCOSE 130*  < > 109*  BUN 18  < > 20  CREATININE 0.72  < > 0.78  CALCIUM 8.3*  < > 8.1*  AST 18  --   --   ALT 14  --   --   ALKPHOS 64  --   --   BILITOT 0.5  --   --   < > = values in this interval not displayed. ------------------------------------------------------------------------------------------------------------------  Cardiac Enzymes  Recent Labs Lab 02/06/15 1909  TROPONINI 0.06*   ------------------------------------------------------------------------------------------------------------------  RADIOLOGY:  No results found.  ASSESSMENT AND PLAN:   * Sepsis - due to healthcare associated pneumonia, lactic acid normal , hemodynamically stable though borderline low blood pressure.    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  *  HCAP (healthcare-associated pneumonia) - antibiotics, sputum culture. Duo nebs when necessary. Antitussive medication    Added mucomyst to help with secretions.  * CAD (coronary artery disease) - continue home medications for this. Troponin - non  elevated. * HTN (hypertension) -was on the lower side here, held most home antihypertensives for now BP stable. * Chronic combined systolic and diastolic CHF (congestive heart failure) - continue home meds for this, except for those which lower blood pressure. This can be restarted once her blood pressure improves. * COPD mixed type - continue home inhalers, when necessary albuterol nebs here. * severe malnutrition- Ensure. * Skin cancer- she want to follow with oncologist here- will refer to clinic after discharge.  All the records are reviewed and case discussed with Care Management/Social Workerr. Management plans discussed with the patient, family and they are in agreement.  CODE STATUS full  TOTAL TIME TAKING CARE OF THIS PATIENT: 35 minutes.   More than 50% of the visit was spent in counseling/coordination of care  POSSIBLE D/C IN 2-3 DAYS, DEPENDING ON CLINICAL CONDITION.   Vaughan Basta M.D on 02/09/2015   Between 7am to 6pm - Pager - [phone]  After 6pm go to [email or web address] - password [email or web address]  [place] [facility]  Office  [phone]  CC: Primary care physician; Juanell Fairly, MD

## 2015-02-09 NOTE — Care Management Important Message (Signed)
Important Message  Patient Details  Name: Lynn Butler MRN: [id number] Date of Birth: [birth year]   Medicare Important Message Given:  Yes-second notification given    Katrina Stack, RN 02/09/2015, 5:08 [place] Message  Patient Details  Name: Lynn Butler MRN: [id number] Date of Birth: 1946/01/05   Medicare Important Message Given:  Yes-second notification given    Katrina Stack, RN 02/09/2015, 5:08 PM

## 2015-02-10 LAB — VANCOMYCIN, TROUGH: VANCOMYCIN TR: 15 ug/mL (ref 10–20)

## 2015-02-10 LAB — CREATININE, SERUM
CREATININE: 0.82 mg/dL (ref 0.44–1.00)
GFR calc non Af Amer: >60 mL/min (ref >60)

## 2015-02-10 MED ORDER — POLYETHYLENE GLYCOL 3350 17 G PO PACK
17.0000 g | PACK | Freq: Every day | ORAL | Status: DC
  Administered 2015-02-11: 17 g via ORAL
  Filled 2015-02-10: qty 1

## 2015-02-10 MED ORDER — VANCOMYCIN HCL 500 MG IV SOLR
500.0000 mg | Freq: Three times a day (TID) | INTRAVENOUS | Status: DC
  Administered 2015-02-10 (×3): 500 mg via INTRAVENOUS
  Filled 2015-02-10 (×9): qty 500

## 2015-02-10 NOTE — Progress Notes (Signed)
Received order from Dr Lavetta Nielsen for PICC

## 2015-02-10 NOTE — Care Management (Addendum)
New nebulizer needed; hers broke per daughter [name] [phone]. [facility] is not [facility]. Per [name] patient was happy with [facility] and their cert period has expired and was not renewed due to patient in the hospital. [facility] is not [facility] either. [name] declined Haematologist (which is on call [facility]). [facility] checking to see if they can provide nursing for daily skin assessment. They offer telehealth but that will not monitor wounds/skin. Patient states if "her pain is controlled and she we keep the infection down she could stay home". She states that she sometimes has trouble paying for Nicotrol and her nebulizer Albuterol "due to high co-pays". She in on home O2 for past 2 years through [facility]. Her PCP is Dr. Petra Kuba and per [name] patient is trying to get in with [place] left for Dr. Doy Hutching to see if he could follow up. [name] advised to stay with Dr. Petra Kuba due to home health needs until established with new PCP- she agrees. [name] states she has used [facility] and okay with that agency for [facility].  Her pharmacy is [facility]. Plan is for patient to go to her ex-husbands address: [address] [place] [address] and she can ride in a car. Physician: please order daily skin assessment/wound management instructions, and Rx management RN, SW to assess home arrangements/needs, and physical therapy.. Face-to face needed. RNCM will arrange home health- do not delay discharge based on this need which will be arranged.   1230PM: This RNCM met with CM director and AD regarding [place]. Patient should continue with [facility] agency. Patient refused [facility] due to "wound care not good by [facility] and declined [facility] because they were not familiar with [facility]. Per [name] they were happy with [facility] and would like [place] with them. Floydene Flock with [facility] updated on patient care need and [place] request although it is not [facility]'s scheduled week for [place]. Seth Bake with  [facility] has evaluated this patient for wound care and has accepted this patient. Patient undecided. [name] notified and also thinking about LTAC.

## 2015-02-10 NOTE — Progress Notes (Signed)
PT Cancellation Note  Patient Details Name: Lynn Butler MRN: [id number] DOB: 1946-02-09   Cancelled Treatment:    Reason Eval/Treat Not Completed: Other (comment) (Pt not feeling well and declined PT).  Will try again later if time allows.   [name] 02/10/2015, 2:20 PM   [name], PT MS Acute Rehab Dept. Number: [facility] [id number] and [place] [phone]

## 2015-02-10 NOTE — Progress Notes (Signed)
[facility] will be here between 10-10:30 in am  02/11/15

## 2015-02-10 NOTE — Progress Notes (Signed)
ANTIBIOTIC CONSULT NOTE - FOLLOW UP  Pharmacy Consult for Vancomycin  Indication: HCAP/sepsis  Allergies  Allergen Reactions  . Nsaids Nausea And Vomiting and Other (See Comments)    Reaction:  GI distress and burning     Patient Measurements: Height: 5\' 4"  (162.6 cm) Weight: 101 lb 1.6 oz (45.859 kg) IBW/kg (Calculated) : 54.7  Vital Signs: Temp: 97.8 F (36.6 C) (07/14 1519) Temp Source: Oral (07/14 1519) BP: 108/53 mmHg (07/14 1519) Pulse Rate: 84 (07/14 1519) Intake/Output from previous day: 07/13 0701 - 07/14 0700 In: -  Out: 3350 [Urine:3350] Intake/Output from this shift:    Labs:  Recent Labs  02/09/15 0416 02/10/15 0644  WBC 6.4  --   HGB 9.3*  --   PLT 178  --   CREATININE 0.78 0.82   Estimated Creatinine Clearance: 47.6 mL/min (by C-G formula based on Cr of 0.82).  Recent Labs  02/09/15 0826 02/10/15 1821  VANCOTROUGH 9* 15     Microbiology: Recent Results (from the past 720 hour(s))  Culture, blood (routine x 2)     Status: None   Collection Time: 01/13/15  6:30 PM  Result Value Ref Range Status   Specimen Description BLOOD  Final   Special Requests BLOOD  Final   Culture NO GROWTH 5 DAYS  Final   Report Status 01/18/2015 FINAL  Final  Culture, blood (routine x 2)     Status: None   Collection Time: 01/13/15  8:30 PM  Result Value Ref Range Status   Specimen Description BLOOD  Final   Special Requests NONE  Final   Culture NO GROWTH 5 DAYS  Final   Report Status 01/18/2015 FINAL  Final  Culture, blood (routine x 2)     Status: None (Preliminary result)   Collection Time: 02/06/15  7:09 PM  Result Value Ref Range Status   Specimen Description BLOOD LEFT ARM  Final   Special Requests BOTTLES DRAWN AEROBIC AND ANAEROBIC AER10ML,ANA5ML  Final   Culture NO GROWTH 4 DAYS  Final   Report Status PENDING  Incomplete  Culture, blood (routine x 2)     Status: None (Preliminary result)   Collection Time: 02/06/15  7:42 PM  Result Value Ref  Range Status   Specimen Description BLOOD RIGHT HAND  Final   Special Requests BOTTLES DRAWN AEROBIC AND ANAEROBIC  Final   Culture NO GROWTH 4 DAYS  Final   Report Status PENDING  Incomplete  Urine culture     Status: None   Collection Time: 02/06/15  9:16 PM  Result Value Ref Range Status   Specimen Description URINE, RANDOM  Final   Special Requests Normal  Final   Culture NO GROWTH 1 DAY  Final   Report Status 02/09/2015 FINAL  Final  MRSA PCR Screening     Status: Abnormal   Collection Time: 02/07/15 12:15 PM  Result Value Ref Range Status   MRSA by PCR POSITIVE (A) NEGATIVE Final    Comment: CRITICAL RESULT CALLED TO, READ BACK BY AND VERIFIED WITH: SOUMOUN TOURE AT 1400 ON 02/07/15 VKB        The GeneXpert MRSA Assay (FDA approved for NASAL specimens only), is one component of a comprehensive MRSA colonization surveillance program. It is not intended to diagnose MRSA infection nor to guide or monitor treatment for MRSA infections.   Culture, expectorated sputum-assessment     Status: None   Collection Time: 02/09/15  3:15 PM  Result Value Ref Range Status  Specimen Description INDUCED SPUTUM  Final   Special Requests NONE  Final   Sputum evaluation THIS SPECIMEN IS ACCEPTABLE FOR SPUTUM CULTURE  Final   Report Status 02/09/2015 FINAL  Final  Culture, respiratory (NON-Expectorated)     Status: None (Preliminary result)   Collection Time: 02/09/15  3:15 PM  Result Value Ref Range Status   Specimen Description INDUCED SPUTUM  Final   Special Requests NONE Reflexed from [id number]  Final   Gram Stain   Final    FAIR SPECIMEN - 70-80% WBCS FEW WBC SEEN FEW GRAM POSITIVE COCCI IN CLUSTERS MODERATE GRAM NEGATIVE RODS    Culture APPEARS TO BE NORMAL FLORA  Final   Report Status PENDING  Incomplete    Anti-infectives    Start     Dose/Rate Route Frequency Ordered Stop   02/10/15 0300  vancomycin (VANCOCIN) 500 mg in sodium chloride 0.9 % 100 mL IVPB     500 mg 100  mL/hr over 60 Minutes Intravenous Every 8 hours 02/10/15 0304     02/09/15 1000  vancomycin (VANCOCIN) 500 mg in sodium chloride 0.9 % 100 mL IVPB  Status:  Discontinued     500 mg 100 mL/hr over 60 Minutes Intravenous Every 8 hours 02/09/15 0950 02/10/15 0304   02/07/15 0900  vancomycin (VANCOCIN) 500 mg in sodium chloride 0.9 % 100 mL IVPB  Status:  Discontinued     500 mg 100 mL/hr over 60 Minutes Intravenous Every 12 hours 02/06/15 2309 02/09/15 0950   02/07/15 0230  piperacillin-tazobactam (ZOSYN) IVPB 3.375 g     3.375 g 12.5 mL/hr over 240 Minutes Intravenous Every 8 hours 02/06/15 2309     02/06/15 2015  piperacillin-tazobactam (ZOSYN) IVPB 3.375 g     3.375 g 100 mL/hr over 30 Minutes Intravenous  Once 02/06/15 2004 02/06/15 2100   02/06/15 2015  vancomycin (VANCOCIN) IVPB 1000 mg/200 mL premix     1,000 mg 200 mL/hr over 60 Minutes Intravenous  Once 02/06/15 2004 02/06/15 2207      Assessment: 69 yo female on vanc for HCAP coverage  Goal of Therapy:  Vancomycin trough level 15-20 mcg/ml  Plan:  Vancomycin 500mg  q 8hr hours- check trough 1/2 hour before 4th dose  Vanc trough 7/14 at 1821 = 15, within goal range. Will continue current dosing.  Will need to continue to follow renal function and assess level as clinically warranted.   Lynn Butler 02/10/2015,8:55 PM

## 2015-02-10 NOTE — Progress Notes (Signed)
[place] [facility] [place] NAME: Stepahnie Butler    MR#:  [id number]  DATE OF BIRTH:  1946-07-24  SUBJECTIVE:  CHIEF COMPLAINT:   Chief Complaint  Patient presents with  . Respiratory Distress    Feels little better today, Have SOB. And bouts of cough. Improved after starting mucomyst yesterday.  REVIEW OF SYSTEMS:  CONSTITUTIONAL: No fever, fatigue or weakness.  EYES: No blurred or double vision.  EARS, NOSE, AND THROAT: No tinnitus or ear pain.  RESPIRATORY: positive for cough, shortness of breath, wheezing, no hemoptysis.  CARDIOVASCULAR: No chest pain, orthopnea, edema.  GASTROINTESTINAL: No nausea, vomiting, diarrhea or abdominal pain.  GENITOURINARY: No dysuria, hematuria.  ENDOCRINE: No polyuria, nocturia,  HEMATOLOGY: No anemia, easy bruising or bleeding SKIN: No rash or lesion. MUSCULOSKELETAL: No joint pain or arthritis.   NEUROLOGIC: No tingling, numbness, weakness.  PSYCHIATRY: No anxiety or depression.   ROS  DRUG ALLERGIES:   Allergies  Allergen Reactions  . Nsaids Nausea And Vomiting and Other (See Comments)    Reaction:  GI distress and burning     VITALS:  Blood pressure 112/51, pulse 70, temperature 97.5 F (36.4 C), temperature source Oral, resp. rate 16, height 5\' 4"  (1.626 m), weight 45.859 kg (101 lb 1.6 oz), SpO2 98 %.  PHYSICAL EXAMINATION:  GENERAL:  69 y.o.-year-old patient lying in the bed with no acute distress. Severe malnutrition EYES: Pupils equal, round, reactive to light and accommodation. No scleral icterus. Extraocular muscles intact.  HEENT: Head atraumatic, normocephalic. Oropharynx and nasopharynx clear.  NECK:  Supple, no jugular venous distention. No thyroid enlargement, no tenderness.  LUNGS: Normal breath sounds bilaterally, minimal wheezing and crepitation, coarse sounds. No use of accessory muscles of respiration. Uses nasal canula oxygen. CARDIOVASCULAR: S1, S2 normal. No  murmurs, rubs, or gallops.  ABDOMEN: Soft, nontender, nondistended. Bowel sounds present. No organomegaly or mass. Around umbilicus- scar from previous surgery- is red and some tender. EXTREMITIES: No pedal edema, cyanosis, or clubbing.  NEUROLOGIC: Cranial nerves II through XII are intact. Muscle strength 5/5 in all extremities. Sensation intact. Gait not checked.  PSYCHIATRIC: The patient is alert and oriented x 3.  SKIN: No obvious rash, lesion, or ulcer. On left forehead- a cancerous growth present on skin.  Physical Exam LABORATORY PANEL:   CBC  Recent Labs Lab 02/09/15 0416  WBC 6.4  HGB 9.3*  HCT 31.0*  PLT 178   ------------------------------------------------------------------------------------------------------------------  Chemistries   Recent Labs Lab 02/06/15 1909  02/09/15 0416 02/10/15 0644  NA 136  < > 139  --   K 3.9  < > 3.9  --   CL 97*  < > 96*  --   CO2 32  < > 35*  --   GLUCOSE 130*  < > 109*  --   BUN 18  < > 20  --   CREATININE 0.72  < > 0.78 0.82  CALCIUM 8.3*  < > 8.1*  --   AST 18  --   --   --   ALT 14  --   --   --   ALKPHOS 64  --   --   --   BILITOT 0.5  --   --   --   < > = values in this interval not displayed. ------------------------------------------------------------------------------------------------------------------  Cardiac Enzymes  Recent Labs Lab 02/06/15 1909  TROPONINI 0.06*   ------------------------------------------------------------------------------------------------------------------  RADIOLOGY:  No results found.  ASSESSMENT AND PLAN:   *  Sepsis - due to healthcare associated pneumonia, lactic acid normal , hemodynamically stable though borderline low blood pressure.    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    Will liekly be able to taper Abx tomorrow. * HCAP (healthcare-associated pneumonia) - antibiotics, sputum culture. Duo nebs when  necessary. Antitussive medication    Added mucomyst to help with secretions. Feels better now,  * CAD (coronary artery disease) - continue home medications for this. Troponin - non elevated. * HTN (hypertension) -was on the lower side here, held most home antihypertensives for now BP stable. * Chronic combined systolic and diastolic CHF (congestive heart failure) - continue home meds for this, except for those which lower blood pressure. This can be restarted once her blood pressure improves. * COPD mixed type - continue home inhalers, when necessary albuterol nebs here. * severe malnutrition- Ensure. Appreciated dietician help./ * Skin cancer- she want to follow with oncologist here- will refer to clinic after discharge.  All the records are reviewed and case discussed with Care Management/Social Workerr. Management plans discussed with the patient, family and they are in agreement.  CODE STATUS full  TOTAL TIME TAKING CARE OF THIS PATIENT: 35 minutes.   More than 50% of the visit was spent in counseling/coordination of care  POSSIBLE D/C IN 2-3 DAYS, DEPENDING ON CLINICAL CONDITION.   Vaughan Basta M.D on 02/10/2015   Between 7am to 6pm - Pager - [phone]  After 6pm go to [email or web address] - password [email or web address] [facility]  [facility]  Office  [phone]  CC: Primary care physician; Juanell Fairly, MD

## 2015-02-11 ENCOUNTER — Ambulatory Visit (HOSPITAL_COMMUNITY)
Admission: AD | Admit: 2015-02-11 | Discharge: 2015-02-11 | Disposition: A | Discharge status: Home / Self Care | Payer: Medicare Other | Source: Other Acute Inpatient Hospital | Attending: Internal Medicine | Admitting: Internal Medicine

## 2015-02-11 ENCOUNTER — Inpatient Hospital Stay: Payer: Medicare Other

## 2015-02-11 DIAGNOSIS — A419 Sepsis, unspecified organism: Secondary | ICD-10-CM | POA: Insufficient documentation

## 2015-02-11 LAB — CULTURE, BLOOD (ROUTINE X 2)
CULTURE: NO GROWTH
Culture: NO GROWTH

## 2015-02-11 LAB — EXPECTORATED SPUTUM ASSESSMENT W GRAM STAIN, RFLX TO RESP C

## 2015-02-11 MED ORDER — ENSURE ENLIVE PO LIQD: 237.0000 mL | Freq: Three times a day (TID) | ORAL | Status: AC

## 2015-02-11 MED ORDER — ISOSORBIDE MONONITRATE ER 30 MG PO TB24: 30.0000 mg | ORAL_TABLET | Freq: Every day | ORAL | Status: AC

## 2015-02-11 MED ORDER — IPRATROPIUM-ALBUTEROL 0.5-2.5 (3) MG/3ML IN SOLN: 3.0000 mL | Freq: Four times a day (QID) | RESPIRATORY_TRACT | Status: AC | PRN

## 2015-02-11 MED ORDER — HYDROMORPHONE HCL 4 MG PO TABS: 4.0000 mg | ORAL_TABLET | Freq: Four times a day (QID) | ORAL | Status: DC | PRN

## 2015-02-11 MED ORDER — LORAZEPAM 1 MG PO TABS: 1.0000 mg | ORAL_TABLET | Freq: Three times a day (TID) | ORAL | Status: DC | PRN

## 2015-02-11 MED ORDER — VANCOMYCIN HCL 500 MG IV SOLR: 500.0000 mg | Freq: Three times a day (TID) | INTRAVENOUS | Status: DC

## 2015-02-11 MED ORDER — BENZONATATE 200 MG PO CAPS: 200.0000 mg | ORAL_CAPSULE | Freq: Three times a day (TID) | ORAL | Status: AC | PRN

## 2015-02-11 MED ORDER — GUAIFENESIN ER 600 MG PO TB12: 600.0000 mg | ORAL_TABLET | Freq: Two times a day (BID) | ORAL | Status: AC

## 2015-02-11 MED ORDER — PIPERACILLIN-TAZOBACTAM 3.375 G IVPB: 3.3750 g | Freq: Three times a day (TID) | INTRAVENOUS | Status: DC

## 2015-02-11 MED ORDER — OXYCODONE HCL ER 80 MG PO T12A: 80.0000 mg | EXTENDED_RELEASE_TABLET | Freq: Two times a day (BID) | ORAL | Status: DC

## 2015-02-11 MED ORDER — ACETYLCYSTEINE 20 % IN SOLN: 2.0000 mL | Freq: Two times a day (BID) | RESPIRATORY_TRACT | Status: DC

## 2015-02-11 NOTE — Care Management (Addendum)
Spoke with patient regarding LTAC for pain control and wound care. She is still thinking. RNCM and Seth Bake with [facility] with follow up with patient after lunch. It would be helpful if MD could speak with patient about this versus home health services.   Patient and daughter agree to transfer to [facility] today after PICC line to be placed prior to transfer. [facility] packet started but needs updating by RN/Unit Clerk.

## 2015-02-11 NOTE — Discharge Instructions (Signed)
Need to follow sputum culture report from [facility]- collected on 02/09/15- to further adjust / change antibiotics.  Follow with cancer center for skin cancer in 2 weeks.  Follow with PMD in 2 weeks.

## 2015-02-11 NOTE — Discharge Summary (Signed)
[place] [facility] NAME: Lynn Butler    MR#:  [id number]  DATE OF BIRTH:  07-Sep-1945  DATE OF ADMISSION:  02/06/2015 ADMITTING PHYSICIAN: Lance Coon, MD  DATE OF DISCHARGE: 02/11/2015  PRIMARY CARE PHYSICIAN: Juanell Fairly, MD    ADMISSION DIAGNOSIS:  SOB (shortness of breath) [R06.02] Healthcare-associated pneumonia [J18.9] Sepsis, due to unspecified organism [A41.9]  DISCHARGE DIAGNOSIS:  Principal Problem:   Sepsis Active Problems:   COPD mixed type   CAD (coronary artery disease)   HTN (hypertension)   Chronic combined systolic and diastolic CHF (congestive heart failure)   HCAP (healthcare-associated pneumonia)   Sputum culture in process- 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  SECONDARY DIAGNOSIS:   Past Medical History  Diagnosis Date  . COPD (chronic obstructive pulmonary disease)   . Hypertension   . Ischemic cardiomyopathy     a.   . Coronary artery disease     a. s/p multiple stenting in 2005  . History of ventricular tachycardia   . PVD (peripheral vascular disease)   . PAD (peripheral artery disease)   . HLD (hyperlipidemia)   . Chronic back pain   . MI, old   . MRSA (methicillin resistant staph aureus) culture positive     left foot wound  . CHF (congestive heart failure)     HOSPITAL COURSE:   * Sepsis - due to healthcare associated pneumonia, lactic acid normal , hemodynamically stable though borderline low blood pressure.   Vanc+ zosyn negative Blood cultures and urine cultures , awaited sputum culture due to hx and carried of MRSA.   Will liekly be able to taper Abx in 1-2 days.  * HCAP (healthcare-associated pneumonia) - antibiotics, sputum culture. Duo nebs when necessary. Antitussive medication  Added mucomyst to help with secretions. Feels better now, at baseline on oxygen at home.  *  CAD (coronary artery disease) - continue home medications for this. Troponin - non elevated. * HTN (hypertension) -was on the lower side here, held most home antihypertensives for now BP stable. * Chronic combined systolic and diastolic CHF (congestive heart failure) - continue home meds for this, except for those which lower blood pressure. This can be restarted once her blood pressure improves. * COPD mixed type - continue home inhalers, when necessary albuterol nebs here. * severe malnutrition- Ensure. Appreciated dietician help. * Skin cancer- she want to follow with oncologist here- will refer to clinic after discharge.  Due to overall poor health, malnutrition and skin ulcer, she will need long term care- she agreed for LTAC. PICC line placed. Will d/c today.  DISCHARGE CONDITIONS:   Stable.  CONSULTS OBTAINED:     DRUG ALLERGIES:   Allergies  Allergen Reactions  . Nsaids Nausea And Vomiting and Other (See Comments)    Reaction:  GI distress and burning     DISCHARGE MEDICATIONS:   Current Discharge Medication List    START taking these medications   Details  acetylcysteine (MUCOMYST) 20 % nebulizer solution Take 2 mLs by nebulization 2 (two) times daily. Qty: 30 mL, Refills: 12    benzonatate (TESSALON) 200 MG capsule Take 1 capsule (200 mg total) by mouth 3 (three) times daily as needed for cough. Qty: 20 capsule, Refills: 0    feeding supplement, ENSURE ENLIVE, (ENSURE ENLIVE) LIQD Take 237 mLs by mouth 3 (three) times daily with meals. Qty: 237  mL, Refills: 12    guaiFENesin (MUCINEX) 600 MG 12 hr tablet Take 1 tablet (600 mg total) by mouth 2 (two) times daily. Qty: 20 tablet, Refills: 0    piperacillin-tazobactam (ZOSYN) 3.375 GM/50ML IVPB Inject 50 mLs (3.375 g total) into the vein every 8 (eight) hours. Qty: 300 mL, Refills: 0    vancomycin 500 mg in sodium chloride 0.9 % 100 mL Inject 500 mg into the vein every 8 (eight) hours. Qty: 6 Dose, Refills: 0       CONTINUE these medications which have CHANGED   Details  HYDROmorphone (DILAUDID) 4 MG tablet Take 1 tablet (4 mg total) by mouth every 6 (six) hours as needed for severe pain. Qty: 15 tablet, Refills: 0    ipratropium-albuterol (DUONEB) 0.5-2.5 (3) MG/3ML SOLN Take 3 mLs by nebulization every 6 (six) hours as needed. Qty: 360 mL, Refills: 0    isosorbide mononitrate (IMDUR) 30 MG 24 hr tablet Take 1 tablet (30 mg total) by mouth daily. Qty: 30 tablet, Refills: 0    LORazepam (ATIVAN) 1 MG tablet Take 1 tablet (1 mg total) by mouth every 8 (eight) hours as needed for anxiety. Qty: 15 tablet, Refills: 0    OxyCODONE (OXYCONTIN) 80 mg T12A 12 hr tablet Take 1 tablet (80 mg total) by mouth every 12 (twelve) hours. Qty: 14 tablet, Refills: 0      CONTINUE these medications which have NOT CHANGED   Details  albuterol (PROAIR HFA) 108 (90 BASE) MCG/ACT inhaler Inhale 1-2 puffs into the lungs every 4 (four) hours as needed for wheezing or shortness of breath.    atorvastatin (LIPITOR) 40 MG tablet Take 1 tablet (40 mg total) by mouth daily at 6 PM. Qty: 30 tablet, Refills: 0    carvedilol (COREG) 3.125 MG tablet Take 1 tablet (3.125 mg total) by mouth 2 (two) times daily with a meal. Qty: 60 tablet, Refills: 2    clopidogrel (PLAVIX) 75 MG tablet Take 75 mg by mouth daily.    diazepam (VALIUM) 10 MG tablet Take 1 tablet (10 mg total) by mouth every 12 (twelve) hours as needed for anxiety. Qty: 30 tablet, Refills: 0    diphenoxylate-atropine (LOMOTIL) 2.5-0.025 MG per tablet Take 1 tablet by mouth every 6 (six) hours as needed for diarrhea or loose stools.    Fluticasone Furoate-Vilanterol (BREO ELLIPTA) 100-25 MCG/INH AEPB Inhale 1 puff into the lungs daily.    furosemide (LASIX) 20 MG tablet Take 1 tablet (20 mg total) by mouth daily. Qty: 30 tablet, Refills: 0    zolpidem (AMBIEN) 10 MG tablet Take 1 tablet (10 mg total) by mouth at bedtime as needed for sleep. Qty: 10  tablet, Refills: 0      STOP taking these medications     Fluticasone-Salmeterol (ADVAIR) 250-50 MCG/DOSE AEPB      levofloxacin (LEVAQUIN) 250 MG tablet      nitroGLYCERIN (NITROSTAT) 0.4 MG SL tablet      predniSONE (STERAPRED UNI-PAK 21 TAB) 10 MG (21) TBPK tablet          DISCHARGE INSTRUCTIONS:   Need to follow sputum culture report from [facility]- collected on 02/09/15- to further adjust / change antibiotics.  Follow with cancer center for skin cancer in 2 weeks.  If you experience worsening of your admission symptoms, develop shortness of breath, life threatening emergency, suicidal or homicidal thoughts you must seek medical attention immediately by calling 911 or calling your MD immediately  if symptoms less severe.  You  Must read complete instructions/literature along with all the possible adverse reactions/side effects for all the Medicines you take and that have been prescribed to you. Take any new Medicines after you have completely understood and accept all the possible adverse reactions/side effects.   Please note  You were cared for by a hospitalist during your hospital stay. If you have any questions about your discharge medications or the care you received while you were in the hospital after you are discharged, you can call the unit and asked to speak with the hospitalist on call if the hospitalist that took care of you is not available. Once you are discharged, your primary care physician will handle any further medical issues. Please note that NO REFILLS for any discharge medications will be authorized once you are discharged, as it is imperative that you return to your primary care physician (or establish a relationship with a primary care physician if you do not have one) for your aftercare needs so that they can reassess your need for medications and monitor your lab values.    Today   CHIEF COMPLAINT:   Chief Complaint  Patient presents with  . Respiratory  Distress    HISTORY OF PRESENT ILLNESS:  Lynn Butler  is a 69 y.o. female presents with acute progressive shortness of breath. Patient states that she began to develop more shortness of breath today with a significantly increased cough. She also states that she felt like she's been having some fevers at home. She has chronic rest or issues at baseline, but states that her shortness of breath increased significantly today to the point that she needed to come to the ED. She states that she tried using her home nebulizers and inhalers, but that nothing was making her breathing better. She has recently been admitted to the hospital multiple times for cardiac issues including multiple myocardial infarctions. On evaluation in the ED she was found to meet SIRS criteria, and on chest x-ray seems to have a new pneumonia. Hospitalists were called for admission for sepsis due to healthcare associated pneumonia.   VITAL SIGNS:  Blood pressure 152/126, pulse 75, temperature 97.6 F (36.4 C), temperature source Oral, resp. rate 18, height 5\' 4"  (1.626 m), weight 45.859 kg (101 lb 1.6 oz), SpO2 96 %.  I/O:   Intake/Output Summary (Last 24 hours) at 02/11/15 1407 Last data filed at 02/11/15 1355  Gross per 24 hour  Intake   1570 ml  Output   1950 ml  Net   -380 ml    PHYSICAL EXAMINATION:   GENERAL: 69 y.o.-year-old patient lying in the bed with no acute distress. Severe malnutrition EYES: Pupils equal, round, reactive to light and accommodation. No scleral icterus. Extraocular muscles intact.  HEENT: Head atraumatic, normocephalic. Oropharynx and nasopharynx clear.  NECK: Supple, no jugular venous distention. No thyroid enlargement, no tenderness.  LUNGS: Normal breath sounds bilaterally, minimal wheezing and crepitation, coarse sounds. No use of accessory muscles of respiration. Uses nasal canula oxygen. CARDIOVASCULAR: S1, S2 normal. No murmurs, rubs, or gallops.  ABDOMEN: Soft, nontender,  nondistended. Bowel sounds present. No organomegaly or mass. Around umbilicus- scar from previous surgery- is red and some tender. EXTREMITIES: No pedal edema, cyanosis, or clubbing.  NEUROLOGIC: Cranial nerves II through XII are intact. Muscle strength 5/5 in all extremities. Sensation intact. Gait not checked.  PSYCHIATRIC: The patient is alert and oriented x 3.  SKIN: No obvious rash, lesion, or ulcer. On left forehead- a cancerous growth present on skin.  DATA REVIEW:   CBC  Recent Labs Lab 02/09/15 0416  WBC 6.4  HGB 9.3*  HCT 31.0*  PLT 178    Chemistries   Recent Labs Lab 02/06/15 1909  02/09/15 0416 02/10/15 0644  NA 136  < > 139  --   K 3.9  < > 3.9  --   CL 97*  < > 96*  --   CO2 32  < > 35*  --   GLUCOSE 130*  < > 109*  --   BUN 18  < > 20  --   CREATININE 0.72  < > 0.78 0.82  CALCIUM 8.3*  < > 8.1*  --   AST 18  --   --   --   ALT 14  --   --   --   ALKPHOS 64  --   --   --   BILITOT 0.5  --   --   --   < > = values in this interval not displayed.  Cardiac Enzymes  Recent Labs Lab 02/06/15 1909  TROPONINI 0.06*    Microbiology Results  Results for orders placed or performed during the hospital encounter of 02/06/15  Culture, blood (routine x 2)     Status: None   Collection Time: 02/06/15  7:09 PM  Result Value Ref Range Status   Specimen Description BLOOD LEFT ARM  Final   Special Requests BOTTLES DRAWN AEROBIC AND ANAEROBIC AER10ML,ANA5ML  Final   Culture NO GROWTH 5 DAYS  Final   Report Status 02/11/2015 FINAL  Final  Culture, blood (routine x 2)     Status: None   Collection Time: 02/06/15  7:42 PM  Result Value Ref Range Status   Specimen Description BLOOD RIGHT HAND  Final   Special Requests BOTTLES DRAWN AEROBIC AND ANAEROBIC  Final   Culture NO GROWTH 5 DAYS  Final   Report Status 02/11/2015 FINAL  Final  Urine culture     Status: None   Collection Time: 02/06/15  9:16 PM  Result Value Ref Range Status   Specimen Description  URINE, RANDOM  Final   Special Requests Normal  Final   Culture NO GROWTH 1 DAY  Final   Report Status 02/09/2015 FINAL  Final  MRSA PCR Screening     Status: Abnormal   Collection Time: 02/07/15 12:15 PM  Result Value Ref Range Status   MRSA by PCR POSITIVE (A) NEGATIVE Final    Comment: CRITICAL RESULT CALLED TO, READ BACK BY AND VERIFIED WITH: SOUMOUN TOURE AT 1400 ON 02/07/15 VKB        The GeneXpert MRSA Assay (FDA approved for NASAL specimens only), is one component of a comprehensive MRSA colonization surveillance program. It is not intended to diagnose MRSA infection nor to guide or monitor treatment for MRSA infections.   Culture, sputum-assessment     Status: None   Collection Time: 02/07/15  7:00 PM  Result Value Ref Range Status   Specimen Description SPUTUM  Final   Special Requests NONE  Final   Report Status 02/11/2015 FINAL  Final  Culture, expectorated sputum-assessment     Status: None   Collection Time: 02/09/15  3:15 PM  Result Value Ref Range Status   Specimen Description INDUCED SPUTUM  Final   Special Requests NONE  Final   Sputum evaluation THIS SPECIMEN IS ACCEPTABLE FOR SPUTUM CULTURE  Final   Report Status 02/09/2015 FINAL  Final  Culture, respiratory (NON-Expectorated)     Status:  None (Preliminary result)   Collection Time: 02/09/15  3:15 PM  Result Value Ref Range Status   Specimen Description INDUCED SPUTUM  Final   Special Requests NONE Reflexed from [id number]  Final   Gram Stain   Final    FAIR SPECIMEN - 70-80% WBCS FEW WBC SEEN FEW GRAM POSITIVE COCCI IN CLUSTERS MODERATE GRAM NEGATIVE RODS    Culture HOLDING FOR POSSIBLE PATHOGEN  Final   Report Status PENDING  Incomplete    RADIOLOGY:  Dg Chest Port 1 View  02/11/2015   CLINICAL DATA:  PICC line.  EXAM: PORTABLE CHEST - 1 VIEW  COMPARISON:  None.  FINDINGS: Mediastinum and hilar structures normal. Interim near complete clearing right base infiltrate. Cardiomegaly. Cardiac pacer with  lead tip in right ventricle. No pleural effusion pneumothorax. Skin fold on the right.  IMPRESSION: 1.  Interim near complete clearing of right base infiltrate .  2.  Stable cardiomegaly.  Cardiac pacer in stable position .   Electronically Signed   By: Marcello Moores  Register   On: 02/11/2015 12:29     Management plans discussed with the patient, family and they are in agreement.  CODE STATUS:     Code Status Orders        Start     Ordered   02/06/15 2341  Full code   Continuous     02/06/15 2340      TOTAL TIME TAKING CARE OF THIS PATIENT: 40 minutes.    Vaughan Basta M.D on 02/11/2015 at 2:07 PM  Between 7am to 6pm - Pager - [phone]  After 6pm go to [email or web address] - password [email or web address] [facility]  [place] [facility]  Office  [phone]  CC: Primary care physician; Juanell Fairly, MD

## 2015-02-12 LAB — CULTURE, RESPIRATORY W GRAM STAIN: Culture: NORMAL

## 2015-02-12 LAB — CULTURE, RESPIRATORY

## 2015-04-07 ENCOUNTER — Inpatient Hospital Stay
Admission: EM | Admit: 2015-04-07 | Discharge: 2015-04-11 | DRG: 871 | Disposition: A | Discharge status: Skilled Nursing Facility | Payer: Medicare Other | Attending: Specialist | Admitting: Specialist

## 2015-04-07 ENCOUNTER — Inpatient Hospital Stay: Payer: Medicare Other

## 2015-04-07 ENCOUNTER — Emergency Department: Payer: Medicare Other

## 2015-04-07 ENCOUNTER — Encounter: Payer: Self-pay | Admitting: *Deleted

## 2015-04-07 DIAGNOSIS — G4700 Insomnia, unspecified: Secondary | ICD-10-CM | POA: Diagnosis present

## 2015-04-07 DIAGNOSIS — J189 Pneumonia, unspecified organism: Secondary | ICD-10-CM

## 2015-04-07 DIAGNOSIS — I2510 Atherosclerotic heart disease of native coronary artery without angina pectoris: Secondary | ICD-10-CM | POA: Diagnosis present

## 2015-04-07 DIAGNOSIS — I739 Peripheral vascular disease, unspecified: Secondary | ICD-10-CM | POA: Diagnosis present

## 2015-04-07 DIAGNOSIS — E46 Unspecified protein-calorie malnutrition: Secondary | ICD-10-CM | POA: Diagnosis present

## 2015-04-07 DIAGNOSIS — Z8614 Personal history of Methicillin resistant Staphylococcus aureus infection: Secondary | ICD-10-CM | POA: Diagnosis not present

## 2015-04-07 DIAGNOSIS — I5042 Chronic combined systolic (congestive) and diastolic (congestive) heart failure: Secondary | ICD-10-CM | POA: Diagnosis present

## 2015-04-07 DIAGNOSIS — Z66 Do not resuscitate: Secondary | ICD-10-CM | POA: Diagnosis present

## 2015-04-07 DIAGNOSIS — Z8249 Family history of ischemic heart disease and other diseases of the circulatory system: Secondary | ICD-10-CM

## 2015-04-07 DIAGNOSIS — J441 Chronic obstructive pulmonary disease with (acute) exacerbation: Secondary | ICD-10-CM | POA: Diagnosis present

## 2015-04-07 DIAGNOSIS — Z803 Family history of malignant neoplasm of breast: Secondary | ICD-10-CM | POA: Diagnosis not present

## 2015-04-07 DIAGNOSIS — D72829 Elevated white blood cell count, unspecified: Secondary | ICD-10-CM

## 2015-04-07 DIAGNOSIS — Z888 Allergy status to other drugs, medicaments and biological substances status: Secondary | ICD-10-CM | POA: Diagnosis not present

## 2015-04-07 DIAGNOSIS — E876 Hypokalemia: Secondary | ICD-10-CM | POA: Diagnosis present

## 2015-04-07 DIAGNOSIS — A419 Sepsis, unspecified organism: Secondary | ICD-10-CM | POA: Diagnosis present

## 2015-04-07 DIAGNOSIS — I252 Old myocardial infarction: Secondary | ICD-10-CM

## 2015-04-07 DIAGNOSIS — Z85828 Personal history of other malignant neoplasm of skin: Secondary | ICD-10-CM | POA: Diagnosis not present

## 2015-04-07 DIAGNOSIS — I255 Ischemic cardiomyopathy: Secondary | ICD-10-CM | POA: Diagnosis present

## 2015-04-07 DIAGNOSIS — F17210 Nicotine dependence, cigarettes, uncomplicated: Secondary | ICD-10-CM | POA: Diagnosis present

## 2015-04-07 DIAGNOSIS — M549 Dorsalgia, unspecified: Secondary | ICD-10-CM | POA: Diagnosis present

## 2015-04-07 DIAGNOSIS — Z9049 Acquired absence of other specified parts of digestive tract: Secondary | ICD-10-CM | POA: Diagnosis present

## 2015-04-07 DIAGNOSIS — G8929 Other chronic pain: Secondary | ICD-10-CM | POA: Diagnosis present

## 2015-04-07 DIAGNOSIS — C4490 Unspecified malignant neoplasm of skin, unspecified: Secondary | ICD-10-CM | POA: Diagnosis present

## 2015-04-07 DIAGNOSIS — N179 Acute kidney failure, unspecified: Secondary | ICD-10-CM

## 2015-04-07 DIAGNOSIS — Z515 Encounter for palliative care: Secondary | ICD-10-CM | POA: Diagnosis not present

## 2015-04-07 DIAGNOSIS — J181 Lobar pneumonia, unspecified organism: Secondary | ICD-10-CM

## 2015-04-07 DIAGNOSIS — Z8701 Personal history of pneumonia (recurrent): Secondary | ICD-10-CM

## 2015-04-07 DIAGNOSIS — J9621 Acute and chronic respiratory failure with hypoxia: Secondary | ICD-10-CM | POA: Diagnosis present

## 2015-04-07 DIAGNOSIS — E43 Unspecified severe protein-calorie malnutrition: Secondary | ICD-10-CM | POA: Diagnosis present

## 2015-04-07 DIAGNOSIS — Z79899 Other long term (current) drug therapy: Secondary | ICD-10-CM | POA: Diagnosis not present

## 2015-04-07 DIAGNOSIS — I10 Essential (primary) hypertension: Secondary | ICD-10-CM | POA: Diagnosis present

## 2015-04-07 DIAGNOSIS — Z681 Body mass index (BMI) 19 or less, adult: Secondary | ICD-10-CM | POA: Diagnosis not present

## 2015-04-07 DIAGNOSIS — Z9889 Other specified postprocedural states: Secondary | ICD-10-CM | POA: Diagnosis not present

## 2015-04-07 DIAGNOSIS — I248 Other forms of acute ischemic heart disease: Secondary | ICD-10-CM | POA: Diagnosis present

## 2015-04-07 DIAGNOSIS — E785 Hyperlipidemia, unspecified: Secondary | ICD-10-CM | POA: Diagnosis present

## 2015-04-07 DIAGNOSIS — G9341 Metabolic encephalopathy: Secondary | ICD-10-CM

## 2015-04-07 HISTORY — DX: Pneumonia, unspecified organism: J18.9

## 2015-04-07 LAB — LACTIC ACID, PLASMA
Lactic Acid, Venous: 1.3 mmol/L (ref 0.5–2.0)
Lactic Acid, Venous: 1.5 mmol/L (ref 0.5–2.0)

## 2015-04-07 LAB — URINALYSIS COMPLETE WITH MICROSCOPIC (ARMC ONLY)
Bilirubin Urine: NEGATIVE
Glucose, UA: NEGATIVE mg/dL
HGB URINE DIPSTICK: NEGATIVE
KETONES UR: NEGATIVE mg/dL
Leukocytes, UA: NEGATIVE
NITRITE: NEGATIVE
PROTEIN: 100 mg/dL — AB
SPECIFIC GRAVITY, URINE: 1.021 (ref 1.005–1.030)
pH: 5 (ref 5.0–8.0)

## 2015-04-07 LAB — BLOOD GAS, ARTERIAL
ACID-BASE EXCESS: 1.2 mmol/L (ref 0.0–3.0)
BICARBONATE: 29 meq/L — AB (ref 21.0–28.0)
FIO2: 36
O2 Saturation: 93 %
PCO2 ART: 59 mmHg — AB (ref 32.0–48.0)
PH ART: 7.3 — AB (ref 7.350–7.450)
PO2 ART: 74 mmHg — AB (ref 83.0–108.0)
Patient temperature: 37

## 2015-04-07 LAB — COMPREHENSIVE METABOLIC PANEL
ALBUMIN: 3 g/dL — AB (ref 3.5–5.0)
ALT: 24 U/L (ref 14–54)
AST: 38 U/L (ref 15–41)
Alkaline Phosphatase: 109 U/L (ref 38–126)
Anion gap: 13 (ref 5–15)
BUN: 51 mg/dL — AB (ref 6–20)
CHLORIDE: 105 mmol/L (ref 101–111)
CO2: 27 mmol/L (ref 22–32)
CREATININE: 2.11 mg/dL — AB (ref 0.44–1.00)
Calcium: 8.2 mg/dL — ABNORMAL LOW (ref 8.9–10.3)
GFR calc Af Amer: 27 mL/min — ABNORMAL LOW (ref >60)
GFR, EST NON AFRICAN AMERICAN: 23 mL/min — AB (ref >60)
GLUCOSE: 131 mg/dL — AB (ref 65–99)
POTASSIUM: 4.8 mmol/L (ref 3.5–5.1)
Sodium: 145 mmol/L (ref 135–145)
Total Bilirubin: 0.9 mg/dL (ref 0.3–1.2)
Total Protein: 6.5 g/dL (ref 6.5–8.1)

## 2015-04-07 LAB — GLUCOSE, CAPILLARY: Glucose-Capillary: 133 mg/dL — ABNORMAL HIGH (ref 65–99)

## 2015-04-07 LAB — CBC WITH DIFFERENTIAL/PLATELET
BASOS PCT: 0 %
Basophils Absolute: 0 10*3/uL (ref 0–0.1)
EOS PCT: 0 %
Eosinophils Absolute: 0 10*3/uL (ref 0–0.7)
HEMATOCRIT: 39.2 % (ref 35.0–47.0)
Hemoglobin: 11.5 g/dL — ABNORMAL LOW (ref 12.0–16.0)
LYMPHS ABS: 1.9 10*3/uL (ref 1.0–3.6)
Lymphocytes Relative: 7 %
MCH: 20.8 pg — AB (ref 26.0–34.0)
MCHC: 29.3 g/dL — AB (ref 32.0–36.0)
MCV: 70.9 fL — AB (ref 80.0–100.0)
MONO ABS: 2.5 10*3/uL — AB (ref 0.2–0.9)
MONOS PCT: 9 %
NEUTROS ABS: 23.1 10*3/uL — AB (ref 1.4–6.5)
Neutrophils Relative %: 84 %
Platelets: 265 10*3/uL (ref 150–440)
RBC: 5.53 MIL/uL — ABNORMAL HIGH (ref 3.80–5.20)
RDW: 19.6 % — AB (ref 11.5–14.5)
WBC: 27.5 10*3/uL — AB (ref 3.6–11.0)

## 2015-04-07 LAB — PROTIME-INR
INR: 1.39
Prothrombin Time: 17.3 seconds — ABNORMAL HIGH (ref 11.4–15.0)

## 2015-04-07 LAB — APTT: aPTT: 29 seconds (ref 24–36)

## 2015-04-07 LAB — LIPASE, BLOOD: LIPASE: 12 U/L — AB (ref 22–51)

## 2015-04-07 LAB — BRAIN NATRIURETIC PEPTIDE: B Natriuretic Peptide: 2338 pg/mL — ABNORMAL HIGH (ref 0.0–100.0)

## 2015-04-07 LAB — TROPONIN I: Troponin I: 1.04 ng/mL — ABNORMAL HIGH (ref <0.031)

## 2015-04-07 LAB — SURGICAL PCR SCREEN
MRSA, PCR: POSITIVE — AB
STAPHYLOCOCCUS AUREUS: POSITIVE — AB

## 2015-04-07 MED ORDER — CETYLPYRIDINIUM CHLORIDE 0.05 % MT LIQD
7.0000 mL | Freq: Two times a day (BID) | OROMUCOSAL | Status: DC
  Administered 2015-04-08 – 2015-04-10 (×4): 7 mL via OROMUCOSAL

## 2015-04-07 MED ORDER — MUPIROCIN 2 % EX OINT
TOPICAL_OINTMENT | Freq: Two times a day (BID) | CUTANEOUS | Status: DC
  Administered 2015-04-07 – 2015-04-11 (×8): via NASAL
  Filled 2015-04-07: qty 22

## 2015-04-07 MED ORDER — IPRATROPIUM-ALBUTEROL 0.5-2.5 (3) MG/3ML IN SOLN
3.0000 mL | RESPIRATORY_TRACT | Status: DC
  Administered 2015-04-07 – 2015-04-08 (×5): 3 mL via RESPIRATORY_TRACT
  Filled 2015-04-07 (×5): qty 3

## 2015-04-07 MED ORDER — PIPERACILLIN-TAZOBACTAM 3.375 G IVPB: 3.3750 g | Freq: Three times a day (TID) | INTRAVENOUS | Status: DC

## 2015-04-07 MED ORDER — PIPERACILLIN-TAZOBACTAM 3.375 G IVPB
3.3750 g | Freq: Once | INTRAVENOUS | Status: AC
  Administered 2015-04-07: 3.375 g via INTRAVENOUS
  Filled 2015-04-07: qty 50

## 2015-04-07 MED ORDER — LEVOFLOXACIN IN D5W 750 MG/150ML IV SOLN
750.0000 mg | Freq: Once | INTRAVENOUS | Status: AC
  Administered 2015-04-07: 750 mg via INTRAVENOUS
  Filled 2015-04-07: qty 150

## 2015-04-07 MED ORDER — FLUTICASONE FUROATE-VILANTEROL 100-25 MCG/INH IN AEPB: 1.0000 | INHALATION_SPRAY | Freq: Every day | RESPIRATORY_TRACT | Status: DC

## 2015-04-07 MED ORDER — ACETYLCYSTEINE 20 % IN SOLN
2.0000 mL | Freq: Two times a day (BID) | RESPIRATORY_TRACT | Status: DC
  Administered 2015-04-07: 2 mL via RESPIRATORY_TRACT
  Filled 2015-04-07: qty 4

## 2015-04-07 MED ORDER — CHLORHEXIDINE GLUCONATE 0.12 % MT SOLN
15.0000 mL | Freq: Two times a day (BID) | OROMUCOSAL | Status: DC
  Administered 2015-04-07 – 2015-04-11 (×7): 15 mL via OROMUCOSAL
  Filled 2015-04-07 (×6): qty 15

## 2015-04-07 MED ORDER — PIPERACILLIN-TAZOBACTAM 4.5 G IVPB
4.5000 g | Freq: Three times a day (TID) | INTRAVENOUS | Status: DC
  Administered 2015-04-07 – 2015-04-11 (×11): 4.5 g via INTRAVENOUS
  Filled 2015-04-07 (×15): qty 100

## 2015-04-07 MED ORDER — ENOXAPARIN SODIUM 30 MG/0.3ML ~~LOC~~ SOLN
30.0000 mg | SUBCUTANEOUS | Status: DC
  Administered 2015-04-07: 30 mg via SUBCUTANEOUS
  Filled 2015-04-07: qty 0.3

## 2015-04-07 MED ORDER — VANCOMYCIN HCL 500 MG IV SOLR: 500.0000 mg | Freq: Three times a day (TID) | INTRAVENOUS | Status: DC

## 2015-04-07 MED ORDER — LEVOFLOXACIN IN D5W 250 MG/50ML IV SOLN
250.0000 mg | INTRAVENOUS | Status: DC
  Filled 2015-04-07: qty 50

## 2015-04-07 MED ORDER — LEVOFLOXACIN IN D5W 500 MG/100ML IV SOLN: 500.0000 mg | INTRAVENOUS | Status: DC

## 2015-04-07 MED ORDER — VANCOMYCIN HCL IN DEXTROSE 1-5 GM/200ML-% IV SOLN
1000.0000 mg | Freq: Once | INTRAVENOUS | Status: AC
  Administered 2015-04-07: 1000 mg via INTRAVENOUS
  Filled 2015-04-07: qty 200

## 2015-04-07 MED ORDER — ACETAMINOPHEN 650 MG RE SUPP
650.0000 mg | Freq: Once | RECTAL | Status: AC
  Administered 2015-04-07: 650 mg via RECTAL
  Filled 2015-04-07: qty 1

## 2015-04-07 MED ORDER — MOMETASONE FURO-FORMOTEROL FUM 100-5 MCG/ACT IN AERO
2.0000 | INHALATION_SPRAY | Freq: Two times a day (BID) | RESPIRATORY_TRACT | Status: DC
  Administered 2015-04-07 – 2015-04-11 (×8): 2 via RESPIRATORY_TRACT
  Filled 2015-04-07: qty 8.8

## 2015-04-07 MED ORDER — SODIUM CHLORIDE 0.9 % IJ SOLN
3.0000 mL | Freq: Two times a day (BID) | INTRAMUSCULAR | Status: DC
  Administered 2015-04-08 – 2015-04-11 (×8): 3 mL via INTRAVENOUS

## 2015-04-07 MED ORDER — SODIUM CHLORIDE 0.9 % IV BOLUS (SEPSIS)
1551.0000 mL | Freq: Once | INTRAVENOUS | Status: AC
  Administered 2015-04-07: 1551 mL via INTRAVENOUS
  Filled 2015-04-07: qty 2000

## 2015-04-07 MED ORDER — HYDROMORPHONE HCL 2 MG PO TABS
2.0000 mg | ORAL_TABLET | ORAL | Status: DC | PRN
  Administered 2015-04-07 – 2015-04-11 (×18): 2 mg via ORAL
  Filled 2015-04-07 (×19): qty 1

## 2015-04-07 MED ORDER — VANCOMYCIN HCL IN DEXTROSE 750-5 MG/150ML-% IV SOLN
750.0000 mg | INTRAVENOUS | Status: DC
  Filled 2015-04-07: qty 150

## 2015-04-07 NOTE — Progress Notes (Addendum)
ANTIBIOTIC CONSULT NOTE - INITIAL  Pharmacy Consult for Levaquin/Zosyn/Vancomycin Indication: sepsis  Allergies  Allergen Reactions  . Nsaids Nausea And Vomiting and Other (See Comments)    Reaction:  GI distress and burning     Patient Measurements: Weight: 113 lb 15.7 oz (51.7 kg) Adjusted Body Weight: 51.7 kg  Vital Signs: Temp: 97.6 F (36.4 C) (09/08 1521) Temp Source: Oral (09/08 1521) BP: 103/60 mmHg (09/08 1340) Pulse Rate: 94 (09/08 1340) Intake/Output from previous day:   Intake/Output from this shift: Total I/O In: -  Out: 200 [Urine:200]  Labs:  Recent Labs  04/07/15 1235  WBC 27.5*  HGB 11.5*  PLT 265  CREATININE 2.11*   Estimated Creatinine Clearance: 20.8 mL/min (by C-G formula based on Cr of 2.11). No results for input(s): VANCOTROUGH, VANCOPEAK, VANCORANDOM, GENTTROUGH, GENTPEAK, GENTRANDOM, TOBRATROUGH, TOBRAPEAK, TOBRARND, AMIKACINPEAK, AMIKACINTROU, AMIKACIN in the last 72 hours.   Microbiology: No results found for this or any previous visit (from the past 720 hour(s)).  Medical History: Past Medical History  Diagnosis Date  . COPD (chronic obstructive pulmonary disease)   . Hypertension   . Ischemic cardiomyopathy     a.   . Coronary artery disease     a. s/p multiple stenting in 2005  . History of ventricular tachycardia   . PVD (peripheral vascular disease)   . PAD (peripheral artery disease)   . HLD (hyperlipidemia)   . Chronic back pain   . MI, old   . MRSA (methicillin resistant staph aureus) culture positive     left foot wound  . CHF (congestive heart failure)   . Pneumonia     Medications:  Prescriptions prior to admission  Medication Sig Dispense Refill Last Dose  . diazepam (VALIUM) 10 MG tablet Take 1 tablet (10 mg total) by mouth every 12 (twelve) hours as needed for anxiety. 30 tablet 0 unknown  . diphenoxylate-atropine (LOMOTIL) 2.5-0.025 MG per tablet Take 1 tablet by mouth every 6 (six) hours as needed for  diarrhea or loose stools.   unknown  . HYDROmorphone (DILAUDID) 4 MG tablet Take 1 tablet (4 mg total) by mouth every 6 (six) hours as needed for severe pain. (Patient taking differently: Take 4 mg by mouth every 12 (twelve) hours as needed for severe pain. ) 15 tablet 0 unknown  . OxyCODONE (OXYCONTIN) 80 mg T12A 12 hr tablet Take 1 tablet (80 mg total) by mouth every 12 (twelve) hours. 14 tablet 0 unknown  . zolpidem (AMBIEN) 10 MG tablet Take 1 tablet (10 mg total) by mouth at bedtime as needed for sleep. 10 tablet 0 unknown  . acetylcysteine (MUCOMYST) 20 % nebulizer solution Take 2 mLs by nebulization 2 (two) times daily. (Patient not taking: Reported on 04/07/2015) 30 mL 12 Not Taking at Unknown time  . albuterol (PROAIR HFA) 108 (90 BASE) MCG/ACT inhaler Inhale 1-2 puffs into the lungs every 4 (four) hours as needed for wheezing or shortness of breath. (Patient not taking: Reported on 04/07/2015)   Not Taking at Unknown time  . atorvastatin (LIPITOR) 40 MG tablet Take 1 tablet (40 mg total) by mouth daily at 6 PM. (Patient not taking: Reported on 04/07/2015) 30 tablet 0 Not Taking at Unknown time  . benzonatate (TESSALON) 200 MG capsule Take 1 capsule (200 mg total) by mouth 3 (three) times daily as needed for cough. (Patient not taking: Reported on 04/07/2015) 20 capsule 0 Not Taking at Unknown time  . carvedilol (COREG) 3.125 MG tablet Take 1 tablet (  3.125 mg total) by mouth 2 (two) times daily with a meal. (Patient not taking: Reported on 04/07/2015) 60 tablet 2 Not Taking at Unknown time  . feeding supplement, ENSURE ENLIVE, (ENSURE ENLIVE) LIQD Take 237 mLs by mouth 3 (three) times daily with meals. (Patient not taking: Reported on 04/07/2015) 237 mL 12 Not Taking at Unknown time  . furosemide (LASIX) 20 MG tablet Take 1 tablet (20 mg total) by mouth daily. (Patient not taking: Reported on 04/07/2015) 30 tablet 0 Not Taking at Unknown time  . guaiFENesin (MUCINEX) 600 MG 12 hr tablet Take 1 tablet (600 mg  total) by mouth 2 (two) times daily. (Patient not taking: Reported on 04/07/2015) 20 tablet 0 Not Taking at Unknown time  . ipratropium-albuterol (DUONEB) 0.5-2.5 (3) MG/3ML SOLN Take 3 mLs by nebulization every 6 (six) hours as needed. (Patient not taking: Reported on 04/07/2015) 360 mL 0 Not Taking at Unknown time  . isosorbide mononitrate (IMDUR) 30 MG 24 hr tablet Take 1 tablet (30 mg total) by mouth daily. (Patient not taking: Reported on 04/07/2015) 30 tablet 0 Not Taking at Unknown time  . LORazepam (ATIVAN) 1 MG tablet Take 1 tablet (1 mg total) by mouth every 8 (eight) hours as needed for anxiety. (Patient not taking: Reported on 04/07/2015) 15 tablet 0 Not Taking at Unknown time  . piperacillin-tazobactam (ZOSYN) 3.375 GM/50ML IVPB Inject 50 mLs (3.375 g total) into the vein every 8 (eight) hours. (Patient not taking: Reported on 04/07/2015) 300 mL 0 Not Taking at Unknown time  . vancomycin 500 mg in sodium chloride 0.9 % 100 mL Inject 500 mg into the vein every 8 (eight) hours. (Patient not taking: Reported on 04/07/2015) 6 Dose 0 Not Taking at Unknown time   Assessment: Pseudomonas risk factors:   Prior use of Vanc and Zosyn in July 2016  CrCl = 20.8 ml/min Ke = 0.02hr-1 T1/2 = 34.7 hrs Vd = 36.2 L  Goal of Therapy:  Vancomycin trough level 15-20 mcg/ml  Plan:  Expected duration 7 days with resolution of temperature and/or normalization of WBC   Vancomycin 1 gm IV X 1 given in ED on 9/8 @ 14:00.  Vancomyin 750 mg IV Q48H ordered to start 9/9 @ 21:00 ,  ~ 31 hrs after 1st dose (stacked dosing). This pt will not reach Css until 9/15. Will draw 1st trough on 9/11 @ 20:30, which will not be at Css.   Will order Zosyn 4.5 gm IV Q8H EI to start 9/8 @ 20:00.   Levaquin 750 mg IV X 1 given in ED on 9/8. Levaquin 500 mg IV Q24H originally ordered.  Will adjust dose to Levaquin 250 mg IV Q24H to start 9/9 @ 18:00.   Lynn Butler D 04/07/2015,3:59 PM

## 2015-04-07 NOTE — ED Notes (Signed)
Lab called with alert value trop 1.04. Dr Edd Fabian notified

## 2015-04-07 NOTE — ED Notes (Signed)
Blood cultures drawn and sent  1st set right wrist 2nd set let hand.

## 2015-04-07 NOTE — Progress Notes (Signed)
Made Dr. Ether Griffins aware that patient has positive MRSA screen- also patient BNP 2338.

## 2015-04-07 NOTE — ED Notes (Signed)
Pt here with AMS. Pt was hypoxic when EMS arrived. Pt was put on bag mask and came up to 100.  When she arrived here she was still on bag mask. She was changed out to a nasal canula 3L and was maintaining at 90-92%.  Pts uses home O2 and was not on any when EMS arrived. Pt was swelling to bilateral lower extremities and the right is swollen worse than then the left. Dr Edd Fabian at bedside to assess the swelling. Family told EMS that pt is normally ambulatory outside to smoke and that pt has not been outside in a few days. Pt was found on a couch cover in urine and stool.

## 2015-04-07 NOTE — ED Provider Notes (Signed)
[facility] Emergency Department Provider Note  ____________________________________________  Time seen: Approximately 12:09 PM  I have reviewed the triage vital signs and the nursing notes.   HISTORY  Chief Complaint Altered Mental Status  HPI and ROS limited due to patient's altered mental status. All information obtained from EMS and the patient's daughter on arrival.  HPI Lynn Butler is a 69 y.o. female with history of COPD, hypertension, cardiomyopathy, hyperlipidemia, chronic back pain who presents for evaluation of altered mental status. According to her daughter, the patient has for the past 3 days seemed increasingly lethargic. Her home health aide came to check on her yesterday and was concerned for pneumonia and daughter was going to call the primary care doctor today to get antibiotics however the patient has been increasingly altered. She has had cough. No vomiting or diarrhea. No falls. On EMS arrival, she was hypoxic which improved with nonrebreather.   Past Medical History  Diagnosis Date  . COPD (chronic obstructive pulmonary disease)   . Hypertension   . Ischemic cardiomyopathy     a.   . Coronary artery disease     a. s/p multiple stenting in 2005  . History of ventricular tachycardia   . PVD (peripheral vascular disease)   . PAD (peripheral artery disease)   . HLD (hyperlipidemia)   . Chronic back pain   . MI, old   . MRSA (methicillin resistant staph aureus) culture positive     left foot wound  . CHF (congestive heart failure)   . Pneumonia     Patient Active Problem List   Diagnosis Date Noted  . Right lower lobe pneumonia 04/07/2015  . Acute on chronic respiratory failure with hypoxia 04/07/2015  . Acute renal failure 04/07/2015  . Leukocytosis 04/07/2015  . Metabolic encephalopathy 95/63/8756  . HCAP (healthcare-associated pneumonia) 02/06/2015  . Sepsis 02/06/2015  . Chronic respiratory failure with hypoxia  02/01/2015  . Syncope and collapse 02/01/2015  . Chronic combined systolic and diastolic CHF (congestive heart failure) 02/01/2015  . Myocardial infarction in recovery phase 01/20/2015  . HTN (hypertension) 01/20/2015  . Acute MI 01/13/2015  . Chest pain 12/28/2014  . COPD exacerbation 12/16/2014  . Protein-calorie malnutrition, severe 07/22/2014  . Acute on chronic respiratory failure with hypoxemia 07/21/2014  . COPD mixed type 07/21/2014  . CAD (coronary artery disease) 07/21/2014  . CHF (congestive heart failure) 07/21/2014  . PVD (peripheral vascular disease) 07/21/2014  . Leg ulcer 07/21/2014  . Skin lesion of face 07/21/2014  . Adult failure to thrive 07/21/2014  . Claudication of both lower extremities 07/21/2014  . Smoker 07/21/2014    Past Surgical History  Procedure Laterality Date  . Insert / replace / remove pacemaker    . Vascular bypass surgery Bilateral   . Cardiac catheterization N/A 01/14/2015    Procedure: Left Heart Cath;  Surgeon: Wellington Hampshire, MD;  Location: [facility];  Service: Cardiovascular;  Laterality: N/A;  . Cardiac defibrillator placement    . Appendectomy    . Cesarean section    . Abdominal surgery      No current outpatient prescriptions on file.  Allergies Nsaids  Family History  Problem Relation Age of Onset  . CAD Other   . Breast cancer Mother   . Heart disease Mother   . Coronary artery disease Father     Social History Social History  Substance Use Topics  . Smoking status: Current Every Day Smoker -- 0.50 packs/day  Types: Cigarettes  . Smokeless tobacco: None  . Alcohol Use: No    Review of Systems    HPI and ROS limited due to patient's altered mental status. All information obtained from EMS and the patient's daughter on arrival. ____________________________________________   PHYSICAL EXAM:  Filed Vitals:   04/07/15 1700 04/07/15 1705 04/07/15 1800 04/07/15 2010  BP: 107/63  86/71   Pulse:  86     Temp: 99.3 F (37.4 C)  99 F (37.2 C)   TempSrc: Rectal     Resp: 22  13   Weight:      SpO2: 100% 99%  100%    VITAL SIGNS: ED Triage Vitals  Enc Vitals Group     BP --      Pulse --      Resp --      Temp --      Temp src --      SpO2 --      Weight --      Height --      Head Cir --      Peak Flow --      Pain Score --      Pain Loc --      Pain Edu? --      Excl. in [place]? --     Constitutional: Appears lethargic but eyes open to voice and light touch and she is only able to stay awake briefly enough to answer yes or no questions after which she falls asleep immediately. She coughs frequently with thick secretions. Eyes: Conjunctivae are normal. PERRL. EOMI. Head: Atraumatic. Nose: No congestion/rhinnorhea. Mouth/Throat: Mucous membranes are moist.  Oropharynx non-erythematous. Neck: No stridor.   Cardiovascular: tachycardic rate, regular rhythm. Grossly normal heart sounds.  Good peripheral circulation. Respiratory: Tachypnea, increased work of breathing, globally diminished breath sounds, worse in the right.  Gastrointestinal: Soft and nontender. No distention. No abdominal bruits. No CVA tenderness. Genitourinary:  deferred  Musculoskeletal: No lower extremity tenderness nor edema.  No joint effusions. Neurologic:  Moves extremities to pain but does not stay awake enough to cooperate with formal neurological testing.  Skin:  Skin is warm, dry. Poorly healing skin graft in the left forehead. Psychiatric: Mood and affect - unable to assess.  ____________________________________________   LABS (all labs ordered are listed, but only abnormal results are displayed)  Labs Reviewed  SURGICAL PCR SCREEN - Abnormal; Notable for the following:    MRSA, PCR POSITIVE (*)    Staphylococcus aureus POSITIVE (*)    All other components within normal limits  COMPREHENSIVE METABOLIC PANEL - Abnormal; Notable for the following:    Glucose, Bld 131 (*)    BUN 51 (*)     Creatinine, Ser 2.11 (*)    Calcium 8.2 (*)    Albumin 3.0 (*)    GFR calc non Af Amer 23 (*)    GFR calc Af Amer 27 (*)    All other components within normal limits  CBC WITH DIFFERENTIAL/PLATELET - Abnormal; Notable for the following:    WBC 27.5 (*)    RBC 5.53 (*)    Hemoglobin 11.5 (*)    MCV 70.9 (*)    MCH 20.8 (*)    MCHC 29.3 (*)    RDW 19.6 (*)    Neutro Abs 23.1 (*)    Monocytes Absolute 2.5 (*)    All other components within normal limits  TROPONIN I - Abnormal; Notable for the following:    Troponin  I 1.04 (*)    All other components within normal limits  BRAIN NATRIURETIC PEPTIDE - Abnormal; Notable for the following:    B Natriuretic Peptide 2338.0 (*)    All other components within normal limits  LIPASE, BLOOD - Abnormal; Notable for the following:    Lipase 12 (*)    All other components within normal limits  BLOOD GAS, ARTERIAL - Abnormal; Notable for the following:    pH, Arterial 7.30 (*)    pCO2 arterial 59 (*)    pO2, Arterial 74 (*)    Bicarbonate 29.0 (*)    All other components within normal limits  PROTIME-INR - Abnormal; Notable for the following:    Prothrombin Time 17.3 (*)    All other components within normal limits  URINALYSIS COMPLETEWITH MICROSCOPIC ([facility] ONLY) - Abnormal; Notable for the following:    Color, Urine AMBER (*)    APPearance CLEAR (*)    Protein, ur 100 (*)    Bacteria, UA RARE (*)    Squamous Epithelial / LPF 0-5 (*)    All other components within normal limits  GLUCOSE, CAPILLARY - Abnormal; Notable for the following:    Glucose-Capillary 133 (*)    All other components within normal limits  CULTURE, BLOOD (ROUTINE X 2)  CULTURE, BLOOD (ROUTINE X 2)  URINE CULTURE  CULTURE, EXPECTORATED SPUTUM-ASSESSMENT  CULTURE, EXPECTORATED SPUTUM-ASSESSMENT  APTT  LACTIC ACID, PLASMA  LACTIC ACID, PLASMA  BASIC METABOLIC PANEL  CBC   ____________________________________________  EKG  ED ECG REPORT I, Joanne Gavel,  the attending physician, personally viewed and interpreted this ECG.   Date: 04/07/2015  EKG Time: 12:45  Rate: 97  Rhythm: normal sinus rhythm  Axis: normal  Intervals:none  ST&T Change: No acute ST elevation. Q waves in lead 3, aVF, T wave inversions in 3, aVF, V3, V4, V5, V6  ____________________________________________  RADIOLOGY  CXR  IMPRESSION: Right lower lung airspace consolidation, likely representing pneumonia.  ____________________________________________   PROCEDURES  Procedure(s) performed: None  Critical Care performed: Yes, see critical care note(s). Total critical care time spent 30 minutes.  ____________________________________________   INITIAL IMPRESSION / ASSESSMENT AND PLAN / ED COURSE  Pertinent labs & imaging results that were available during my care of the patient were reviewed by me and considered in my medical decision making (see chart for details).  Doloras Tellado is a 69 y.o. female with history of COPD, hypertension, cardiomyopathy, hyperlipidemia, chronic back pain who presents for evaluation of altered mental status. On exam, she is lethargic and bleeding Sirs criteria for tachycardia and tachypnea. She has hypoxic but improves to 94% on nasal cannula. Suspect sepsis secondary to pneumonia. She was hospitalized 2 months ago for the same so will treat for age With vancomycin, Zosyn, Levaquin, 30 L/kg IV fluid bolus. Anticipate admission.   ----------------------------------------- 2:04 PM on 04/07/2015 ----------------------------------------- Labs notable for acute renal failure with creatinine 2.11. Leukocytosis at 27 thousand. Troponin elevated at 1.04, EKG with no acute ST segment elevation. Will not give aspirin as the patient has a reported NSAID allergy that causes severe vomiting and if she begins to vomit, she will likely require intubation for airway protection. Fortunately, at this time her mental status has improved significantly  with fluids and IV antibiotics. Her eyes are open spontaneously, she is able to move all extremities equally to command, she is speaking coherently in short phrases. Case discussed with hospitalist for admission at this time.  ____________________________________________   FINAL CLINICAL IMPRESSION(S) /  ED DIAGNOSES  Final diagnoses:  HCAP (healthcare-associated pneumonia)  Sepsis, due to unspecified organism      Joanne Gavel, MD 04/07/15 2211
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
## 2015-04-07 NOTE — ED Notes (Signed)
Per EMS report, patient lives at home with family, has recent diagnosis of pneumonia, and has been less alert, unable to smoke for the last 2-3 days. EMS report they found the patient without oxygen sitting on a couch, which was urine-soaked, had feces and mucous on it. EMS report states O2 was available in the house. EMS states O2 sats were below 80% upon their arrival. Patient was placed on NRB and arrived with O2 sats at 99%. Patient was unresponsive upon arrival, leaning over to the right. Staff was unable to straighten patient to mid-line. Patient arrived with ants crawling out of socks.

## 2015-04-08 DIAGNOSIS — Z515 Encounter for palliative care: Secondary | ICD-10-CM

## 2015-04-08 DIAGNOSIS — J449 Chronic obstructive pulmonary disease, unspecified: Secondary | ICD-10-CM

## 2015-04-08 DIAGNOSIS — A419 Sepsis, unspecified organism: Principal | ICD-10-CM

## 2015-04-08 DIAGNOSIS — C4490 Unspecified malignant neoplasm of skin, unspecified: Secondary | ICD-10-CM | POA: Diagnosis not present

## 2015-04-08 DIAGNOSIS — I10 Essential (primary) hypertension: Secondary | ICD-10-CM | POA: Diagnosis not present

## 2015-04-08 DIAGNOSIS — I252 Old myocardial infarction: Secondary | ICD-10-CM

## 2015-04-08 DIAGNOSIS — J189 Pneumonia, unspecified organism: Secondary | ICD-10-CM | POA: Diagnosis not present

## 2015-04-08 DIAGNOSIS — Z79899 Other long term (current) drug therapy: Secondary | ICD-10-CM

## 2015-04-08 DIAGNOSIS — N179 Acute kidney failure, unspecified: Secondary | ICD-10-CM

## 2015-04-08 DIAGNOSIS — I2510 Atherosclerotic heart disease of native coronary artery without angina pectoris: Secondary | ICD-10-CM

## 2015-04-08 DIAGNOSIS — I509 Heart failure, unspecified: Secondary | ICD-10-CM

## 2015-04-08 LAB — BASIC METABOLIC PANEL
ANION GAP: 9 (ref 5–15)
BUN: 45 mg/dL — ABNORMAL HIGH (ref 6–20)
CHLORIDE: 107 mmol/L (ref 101–111)
CO2: 29 mmol/L (ref 22–32)
Calcium: 7.8 mg/dL — ABNORMAL LOW (ref 8.9–10.3)
Creatinine, Ser: 1.3 mg/dL — ABNORMAL HIGH (ref 0.44–1.00)
GFR calc non Af Amer: 41 mL/min — ABNORMAL LOW (ref >60)
GFR, EST AFRICAN AMERICAN: 48 mL/min — AB (ref >60)
GLUCOSE: 131 mg/dL — AB (ref 65–99)
Potassium: 3.4 mmol/L — ABNORMAL LOW (ref 3.5–5.1)
Sodium: 145 mmol/L (ref 135–145)

## 2015-04-08 LAB — CBC
HCT: 30.2 % — ABNORMAL LOW (ref 35.0–47.0)
HEMOGLOBIN: 9.2 g/dL — AB (ref 12.0–16.0)
MCH: 21.6 pg — AB (ref 26.0–34.0)
MCHC: 30.5 g/dL — AB (ref 32.0–36.0)
MCV: 70.7 fL — AB (ref 80.0–100.0)
Platelets: 167 10*3/uL (ref 150–440)
RBC: 4.28 MIL/uL (ref 3.80–5.20)
RDW: 18.9 % — ABNORMAL HIGH (ref 11.5–14.5)
WBC: 8.8 10*3/uL (ref 3.6–11.0)

## 2015-04-08 MED ORDER — ENSURE ENLIVE PO LIQD
237.0000 mL | Freq: Three times a day (TID) | ORAL | Status: DC
  Administered 2015-04-09 – 2015-04-11 (×6): 237 mL via ORAL

## 2015-04-08 MED ORDER — ENOXAPARIN SODIUM 40 MG/0.4ML ~~LOC~~ SOLN
40.0000 mg | SUBCUTANEOUS | Status: DC
  Administered 2015-04-08 – 2015-04-09 (×2): 40 mg via SUBCUTANEOUS
  Filled 2015-04-08 (×2): qty 0.4

## 2015-04-08 MED ORDER — BUDESONIDE 0.5 MG/2ML IN SUSP
0.5000 mg | Freq: Two times a day (BID) | RESPIRATORY_TRACT | Status: DC
  Administered 2015-04-08 – 2015-04-11 (×6): 0.5 mg via RESPIRATORY_TRACT
  Filled 2015-04-08 (×7): qty 2

## 2015-04-08 MED ORDER — OXYCODONE HCL ER 10 MG PO T12A
40.0000 mg | EXTENDED_RELEASE_TABLET | Freq: Two times a day (BID) | ORAL | Status: DC
  Administered 2015-04-08 – 2015-04-11 (×7): 40 mg via ORAL
  Filled 2015-04-08 (×4): qty 4
  Filled 2015-04-08: qty 1
  Filled 2015-04-08 (×2): qty 4

## 2015-04-08 MED ORDER — ALBUTEROL SULFATE (2.5 MG/3ML) 0.083% IN NEBU
2.5000 mg | INHALATION_SOLUTION | RESPIRATORY_TRACT | Status: DC
  Administered 2015-04-08 – 2015-04-09 (×6): 2.5 mg via RESPIRATORY_TRACT
  Filled 2015-04-08 (×7): qty 3

## 2015-04-08 MED ORDER — LEVOFLOXACIN IN D5W 750 MG/150ML IV SOLN
750.0000 mg | INTRAVENOUS | Status: DC
  Filled 2015-04-08: qty 150

## 2015-04-08 MED ORDER — VANCOMYCIN HCL IN DEXTROSE 750-5 MG/150ML-% IV SOLN
750.0000 mg | INTRAVENOUS | Status: DC
  Administered 2015-04-08 – 2015-04-09 (×2): 750 mg via INTRAVENOUS
  Filled 2015-04-08 (×3): qty 150

## 2015-04-08 MED ORDER — PREDNISONE 20 MG PO TABS
40.0000 mg | ORAL_TABLET | Freq: Every day | ORAL | Status: DC
  Administered 2015-04-09 – 2015-04-11 (×3): 40 mg via ORAL
  Filled 2015-04-08 (×3): qty 2

## 2015-04-08 MED ORDER — TIOTROPIUM BROMIDE MONOHYDRATE 18 MCG IN CAPS
18.0000 ug | ORAL_CAPSULE | Freq: Every day | RESPIRATORY_TRACT | Status: DC
  Administered 2015-04-08 – 2015-04-11 (×4): 18 ug via RESPIRATORY_TRACT
  Filled 2015-04-08: qty 5

## 2015-04-08 NOTE — Plan of Care (Signed)
Problem: COPD GOLD Progrssion Goal: ABLE TO WEAN TO ROOM AIR Outcome: Progressing Sats good on 3l/[place]. Lungs clear and diminished. Thick yellow sputum expectorated x1.  Sputum specimen pending from early am. Goal: Able to Perform ADL's at Baseline Outcome: Progressing Assists turning in bed side to side.  She has a lot of chronic pain issues that prevent her desire to increase activity today.

## 2015-04-08 NOTE — Consult Note (Addendum)
Palliative Medicine Inpatient Consult Note   Name: Lynn Butler Date: 04/08/2015 MRN: [id number]  DOB: 05/17/1946  Referring Physician: Vaughan Basta, MD  Palliative Care consult requested for this 69 y.o. female for goals of medical therapy in patient with sepsis and RLL pneumonia.    TODAY'S CONVERSATIONS, EVENTS, AND PLANS: Patient says she was under [place] care at her daughter's home.  She says she doesn't want Hospice if it means she 'only has 4 months to live'  BUT if I say she has probably got 6 mos to live [place], she is OK with Hospice. She says she was under Hospice care for about 3 yrs several years ago and they 'cut her loose'.  None of this is verified yet.  She doesn't want to go back to living with her daughter.  Reported horrid conditions (listed below) that she was found in by EMS have been denied by pt --but she says she doesn't care for being around her grandchildren all the time because of the noise and 'climbing' --though she doesn't want to tell her daughter this. She wants to go live again with her ex-husband and his girlfriend in their trailer (she pays them $250 / month).  Says she is now due to have the graft on her forehead rechecked.  Says the skin cancer was excised and a graft was placed while she was at [facility] recently.  Reports that there are staples under the scab (not confirmed).   Pt is full code now but willing to discuss again.   Will try to get some help confirming her statements above. I have sent a message to [facility] (pt says she event spent about 5 days at [facility] 'on [place]').  Will revisit pt on Monday. She is improving medically, but I suspect she will still be here on Monday.  Disposition is most challenging and will be easier if we get accurate social history  And can get her info related to her skin cancer/ skin graft etc.     REVIEW OF SYSTEMS:  She says she feels better and is breathing better.  No fever or chills  or nausea or vomiting Says that her forehead 'skin cancer' is GONE and has been replaced by a skin graft which needs follow up.  No current chest or abd pain or rash or headache.  Unable to give full ROS due to conversational dyspnea.  SPIRITUAL SUPPORT SYSTEM: Unclear  SOCIAL HISTORY:  reports that she has been smoking Cigarettes.  She has been smoking about 0.50 packs per day. She does not have any smokeless tobacco history on file. She reports that she does not drink alcohol or use illicit drugs.  Pt reports she was living at her daughter's home --but she doesn't want to go back there because the grandchildren are too loud and active.  She wants to go back to living with her exhusband and her girlfriend because she pays them $250/ month and she and they like this arrangement (its a trailer).  She was found in horrid conditions reportedly w/o O2 on and sitting on a urine and feces soaked couch with ants in her socks.  Pt denies any such horrid conditions saying her daughter would never permit such circumstances.  She smokes still.  Also says she was under Hospice care for around 3 years several years ago. And says someone with 'Hospice' on his name badge named Clair Gulling has been doing [place] care at her daughter's home  recently.  But she doesn't think he was with Hospice --but with the [place] part of a [place].   None of this has been explored or investigated yet here.   LEGAL DOCUMENTS:  None located  CODE STATUS: Full code  --pt willing to discuss again  PAST MEDICAL HISTORY: Past Medical History  Diagnosis Date  . COPD (chronic obstructive pulmonary disease)   . Hypertension   . Ischemic cardiomyopathy     a.   . Coronary artery disease     a. s/p multiple stenting in 2005  . History of ventricular tachycardia   . PVD (peripheral vascular disease)   . PAD (peripheral artery disease)   . HLD (hyperlipidemia)   . Chronic back pain   . MI, old   . MRSA (methicillin resistant  staph aureus) culture positive     left foot wound  . CHF (congestive heart failure)   . Pneumonia     PAST SURGICAL HISTORY:  Past Surgical History  Procedure Laterality Date  . Insert / replace / remove pacemaker    . Vascular bypass surgery Bilateral   . Cardiac catheterization N/A 01/14/2015    Procedure: Left Heart Cath;  Surgeon: Wellington Hampshire, MD;  Location: [facility];  Service: Cardiovascular;  Laterality: N/A;  . Cardiac defibrillator placement    . Appendectomy    . Cesarean section    . Abdominal surgery      ALLERGIES:  is allergic to nsaids.  MEDICATIONS:  Current Facility-Administered Medications  Medication Dose Route Frequency Provider Last Rate Last Dose  . albuterol (PROVENTIL) (2.5 MG/3ML) 0.083% nebulizer solution 2.5 mg  2.5 mg Nebulization Q4H Flora Lipps, MD   2.5 mg at 04/08/15 1151  . antiseptic oral rinse (CPC / CETYLPYRIDINIUM CHLORIDE 0.05%) solution 7 mL  7 mL Mouth Rinse q12n4p Theodoro Grist, MD   7 mL at 04/08/15 1200  . budesonide (PULMICORT) nebulizer solution 0.5 mg  0.5 mg Nebulization BID Flora Lipps, MD   0.5 mg at 04/08/15 1200  . chlorhexidine (PERIDEX) 0.12 % solution 15 mL  15 mL Mouth Rinse BID Theodoro Grist, MD   15 mL at 04/08/15 1100  . enoxaparin (LOVENOX) injection 40 mg  40 mg Subcutaneous Q24H Vaughan Basta, MD      . feeding supplement (ENSURE ENLIVE) (ENSURE ENLIVE) liquid 237 mL  237 mL Oral TID WC Vaughan Basta, MD   237 mL at 04/08/15 1453  . HYDROmorphone (DILAUDID) tablet 2 mg  2 mg Oral Q4H PRN Theodoro Grist, MD   2 mg at 04/08/15 1200  . [START ON 04/09/2015] levofloxacin (LEVAQUIN) IVPB 750 mg  750 mg Intravenous Q48H Vaughan Basta, MD      . mometasone-formoterol (DULERA) 100-5 MCG/ACT inhaler 2 puff  2 puff Inhalation BID Theodoro Grist, MD   2 puff at 04/08/15 0756  . mupirocin ointment (BACTROBAN) 2 %   Nasal BID Lance Coon, MD      . OxyCODONE (OXYCONTIN) 12 hr tablet 40 mg  40 mg  Oral Q12H Vaughan Basta, MD   40 mg at 04/08/15 1502  . piperacillin-tazobactam (ZOSYN) IVPB 4.5 g  4.5 g Intravenous 3 times per day Theodoro Grist, MD   4.5 g at 04/08/15 1503  . [START ON 04/09/2015] predniSONE (DELTASONE) tablet 40 mg  40 mg Oral Q breakfast Flora Lipps, MD      . sodium chloride 0.9 % injection 3 mL  3 mL Intravenous Q12H Theodoro Grist, MD  3 mL at 04/08/15 1104  . tiotropium (SPIRIVA) inhalation capsule 18 mcg  18 mcg Inhalation Daily Flora Lipps, MD   18 mcg at 04/08/15 1200  . vancomycin (VANCOCIN) IVPB 750 mg/150 ml premix  750 mg Intravenous Q24H Vaughan Basta, MD   750 mg at 04/08/15 0756    Vital Signs: BP 113/56 mmHg  Pulse 76  Temp(Src) 98.6 F (37 C) (Oral)  Resp 16  Wt 52.1 kg (114 lb 13.8 oz)  SpO2 91% Filed Weights   04/07/15 1210 04/07/15 1600  Weight: 51.7 kg (113 lb 15.7 oz) 52.1 kg (114 lb 13.8 oz)    Estimated body mass index is 19.71 kg/(m^2) as calculated from the following:   Height as of 02/06/15: 5\' 4"  (1.626 m).   Weight as of this encounter: 52.1 kg (114 lb 13.8 oz).  PERFORMANCE STATUS (ECOG) : 2 - Symptomatic, <50% confined to bed  PHYSICAL EXAM: Alert and oriented EOMI She has a very large scab on her forehead (Pt says this is scab over skin graft she got a couple of weeks ago at [facility] and says there are staples in it --I cannot feel staples but scab is very thick) EOMI OP clear Neck w/o JVD or TM Hrt rrr no murmur heard Lungs with ronchi and she has some conversational dyspnea noted Abd soft and nontender with nl BS Skin warm and dry  LABS: CBC:    Component Value Date/Time   WBC 8.8 04/08/2015 0338   WBC 6.9 11/22/2014 0748   HGB 9.2* 04/08/2015 0338   HGB 10.3* 11/22/2014 0748   HCT 30.2* 04/08/2015 0338   HCT 32.9* 11/22/2014 0748   PLT 167 04/08/2015 0338   PLT 189 11/26/2014 0428   MCV 70.7* 04/08/2015 0338   MCV 72* 11/22/2014 0748   NEUTROABS 23.1* 04/07/2015 1235   NEUTROABS 4.6 11/22/2014  0748   LYMPHSABS 1.9 04/07/2015 1235   LYMPHSABS 1.4 11/22/2014 0748   MONOABS 2.5* 04/07/2015 1235   MONOABS 0.8 11/22/2014 0748   EOSABS 0.0 04/07/2015 1235   EOSABS 0.0 11/22/2014 0748   BASOSABS 0.0 04/07/2015 1235   BASOSABS 0.0 11/22/2014 0748   Comprehensive Metabolic Panel:    Component Value Date/Time   NA 145 04/08/2015 0338   NA 139 11/25/2014 0518   K 3.4* 04/08/2015 0338   K 3.8 11/25/2014 0518   CL 107 04/08/2015 0338   CL 103 11/25/2014 0518   CO2 29 04/08/2015 0338   CO2 31 11/25/2014 0518   BUN 45* 04/08/2015 0338   BUN 25* 11/25/2014 0518   CREATININE 1.30* 04/08/2015 0338   CREATININE 0.66 11/25/2014 0518   GLUCOSE 131* 04/08/2015 0338   GLUCOSE 83 11/25/2014 0518   CALCIUM 7.8* 04/08/2015 0338   CALCIUM 8.0* 11/25/2014 0518   AST 38 04/07/2015 1235   AST 23 11/15/2014 1819   ALT 24 04/07/2015 1235   ALT 14 11/15/2014 1819   ALKPHOS 109 04/07/2015 1235   ALKPHOS 109 11/15/2014 1819   BILITOT 0.9 04/07/2015 1235   BILITOT 0.6 11/15/2014 1819   PROT 6.5 04/07/2015 1235   PROT 7.1 11/15/2014 1819   ALBUMIN 3.0* 04/07/2015 1235   ALBUMIN 3.3* 11/15/2014 1819    IMPRESSION: 1.  Sepsis 2.  Right Lower Lobe Pneumonia (HCAP) ---with recent hospital adm for sepsis due to HCAP in July 2016 --on Zosyn, Vanc, and Levaquin  3.  Essential HTN 4.  Ischemic Cardiomyopathy ---with h/o placement of defibrillator / pacer 5. Combined chronic systolic and  diastolic congestive heart failure 6.  Skin cancer  ---Pt says she has a skin graft on her forehead where her cancer had been excised while she was at [facility] ---She is either due or overdue for a follow up.  Says she has staples under the scab. 7.  CAD with multiple stents and h/o MI 8.  PAD 9.  Dyslipidemia 10.  Chronic back pain 11.  Tobacco smoker (current) 12.  COPD 85.  Insomnia 14.  Severe Malnutrition 15.  Social Crisis ----found at home sitting on a couch without oxygen in place  ----couch was  urine and feces soaked.  Ants were reportedly found crawling out of her socks ----refuses all placement options and wants to go live with exhusband and his girlfriend (paying them money)   See top of note for PLAN  More than 50% of the visit was spent in counseling/coordination of care: Yes  Time Spent: 80 minutes

## 2015-04-08 NOTE — Progress Notes (Signed)
ANTIBIOTIC CONSULT NOTE - INITIAL  Pharmacy Consult for Levaquin/Zosyn/Vancomycin Indication: sepsis  Allergies  Allergen Reactions  . Nsaids Nausea And Vomiting and Other (See Comments)    Reaction:  GI distress and burning     Patient Measurements: Weight: 114 lb 13.8 oz (52.1 kg) Adjusted Body Weight: 51.7 kg  Vital Signs: Temp: 97.5 F (36.4 C) (09/09 1100) BP: 110/56 mmHg (09/09 1100) Pulse Rate: 75 (09/09 1100) Intake/Output from previous day: 09/08 0701 - 09/09 0700 In: 200 [IV Piggyback:200] Out: 775 [Urine:775] Intake/Output from this shift: Total I/O In: 390 [P.O.:240; IV Piggyback:150] Out: 300 [Urine:300]  Labs:  Recent Labs  04/07/15 1235 04/08/15 0338  WBC 27.5* 8.8  HGB 11.5* 9.2*  PLT 265 167  CREATININE 2.11* 1.30*   Estimated Creatinine Clearance: 34.1 mL/min (by C-G formula based on Cr of 1.3). No results for input(s): VANCOTROUGH, VANCOPEAK, VANCORANDOM, GENTTROUGH, GENTPEAK, GENTRANDOM, TOBRATROUGH, TOBRAPEAK, TOBRARND, AMIKACINPEAK, AMIKACINTROU, AMIKACIN in the last 72 hours.   Estimated Creatinine Clearance: 34.1 mL/min (by C-G formula based on Cr of 1.3).   Microbiology: Recent Results (from the past 720 hour(s))  Urine culture     Status: None (Preliminary result)   Collection Time: 04/07/15  1:18 PM  Result Value Ref Range Status   Specimen Description URINE, CLEAN CATCH  Final   Special Requests NONE  Final   Culture MULTIPLE SPECIES PRESENT, SUGGEST RECOLLECTION  Final   Report Status PENDING  Incomplete  Surgical pcr screen     Status: Abnormal   Collection Time: 04/07/15  4:00 PM  Result Value Ref Range Status   MRSA, PCR POSITIVE (A) NEGATIVE Final   Staphylococcus aureus POSITIVE (A) NEGATIVE Final    Comment:        The Xpert SA Assay (FDA approved for NASAL specimens in patients over 4 years of age), is one component of a comprehensive surveillance program.  Test performance has been validated by [facility] for  patients greater than or equal to 68 year old. It is not intended to diagnose infection nor to guide or monitor treatment. CRITICAL RESULT CALLED TO, READ BACK BY AND VERIFIED WITH: CHARLIE SLEETWOOD AT 1809 ON 04/07/15 BY MLZ      Medical History: Past Medical History  Diagnosis Date  . COPD (chronic obstructive pulmonary disease)   . Hypertension   . Ischemic cardiomyopathy     a.   . Coronary artery disease     a. s/p multiple stenting in 2005  . History of ventricular tachycardia   . PVD (peripheral vascular disease)   . PAD (peripheral artery disease)   . HLD (hyperlipidemia)   . Chronic back pain   . MI, old   . MRSA (methicillin resistant staph aureus) culture positive     left foot wound  . CHF (congestive heart failure)   . Pneumonia     Medications:  Prescriptions prior to admission  Medication Sig Dispense Refill Last Dose  . diazepam (VALIUM) 10 MG tablet Take 1 tablet (10 mg total) by mouth every 12 (twelve) hours as needed for anxiety. 30 tablet 0 unknown  . diphenoxylate-atropine (LOMOTIL) 2.5-0.025 MG per tablet Take 1 tablet by mouth every 6 (six) hours as needed for diarrhea or loose stools.   unknown  . HYDROmorphone (DILAUDID) 4 MG tablet Take 1 tablet (4 mg total) by mouth every 6 (six) hours as needed for severe pain. (Patient taking differently: Take 4 mg by mouth every 12 (twelve) hours as needed for severe pain. )  15 tablet 0 unknown  . OxyCODONE (OXYCONTIN) 80 mg T12A 12 hr tablet Take 1 tablet (80 mg total) by mouth every 12 (twelve) hours. 14 tablet 0 unknown  . zolpidem (AMBIEN) 10 MG tablet Take 1 tablet (10 mg total) by mouth at bedtime as needed for sleep. 10 tablet 0 unknown  . acetylcysteine (MUCOMYST) 20 % nebulizer solution Take 2 mLs by nebulization 2 (two) times daily. (Patient not taking: Reported on 04/07/2015) 30 mL 12 Not Taking at Unknown time  . albuterol (PROAIR HFA) 108 (90 BASE) MCG/ACT inhaler Inhale 1-2 puffs into the lungs every 4  (four) hours as needed for wheezing or shortness of breath. (Patient not taking: Reported on 04/07/2015)   Not Taking at Unknown time  . atorvastatin (LIPITOR) 40 MG tablet Take 1 tablet (40 mg total) by mouth daily at 6 PM. (Patient not taking: Reported on 04/07/2015) 30 tablet 0 Not Taking at Unknown time  . benzonatate (TESSALON) 200 MG capsule Take 1 capsule (200 mg total) by mouth 3 (three) times daily as needed for cough. (Patient not taking: Reported on 04/07/2015) 20 capsule 0 Not Taking at Unknown time  . carvedilol (COREG) 3.125 MG tablet Take 1 tablet (3.125 mg total) by mouth 2 (two) times daily with a meal. (Patient not taking: Reported on 04/07/2015) 60 tablet 2 Not Taking at Unknown time  . feeding supplement, ENSURE ENLIVE, (ENSURE ENLIVE) LIQD Take 237 mLs by mouth 3 (three) times daily with meals. (Patient not taking: Reported on 04/07/2015) 237 mL 12 Not Taking at Unknown time  . furosemide (LASIX) 20 MG tablet Take 1 tablet (20 mg total) by mouth daily. (Patient not taking: Reported on 04/07/2015) 30 tablet 0 Not Taking at Unknown time  . guaiFENesin (MUCINEX) 600 MG 12 hr tablet Take 1 tablet (600 mg total) by mouth 2 (two) times daily. (Patient not taking: Reported on 04/07/2015) 20 tablet 0 Not Taking at Unknown time  . ipratropium-albuterol (DUONEB) 0.5-2.5 (3) MG/3ML SOLN Take 3 mLs by nebulization every 6 (six) hours as needed. (Patient not taking: Reported on 04/07/2015) 360 mL 0 Not Taking at Unknown time  . isosorbide mononitrate (IMDUR) 30 MG 24 hr tablet Take 1 tablet (30 mg total) by mouth daily. (Patient not taking: Reported on 04/07/2015) 30 tablet 0 Not Taking at Unknown time  . LORazepam (ATIVAN) 1 MG tablet Take 1 tablet (1 mg total) by mouth every 8 (eight) hours as needed for anxiety. (Patient not taking: Reported on 04/07/2015) 15 tablet 0 Not Taking at Unknown time  . piperacillin-tazobactam (ZOSYN) 3.375 GM/50ML IVPB Inject 50 mLs (3.375 g total) into the vein every 8 (eight) hours.  (Patient not taking: Reported on 04/07/2015) 300 mL 0 Not Taking at Unknown time  . vancomycin 500 mg in sodium chloride 0.9 % 100 mL Inject 500 mg into the vein every 8 (eight) hours. (Patient not taking: Reported on 04/07/2015) 6 Dose 0 Not Taking at Unknown time   Assessment: 69 y/o F admitted with sepsis and RLL PNA ordered empiric abx.   Goal of Therapy:  Vancomycin trough level 15-20 mcg/ml  Plan:  1. Continue Zosyn as ordered.  2. Will increase vancomycin to 750 mg iv q 24 hours due to improved renal function and plan on checking a trough with the 4th dose.  3. Will increase Levaquin to HCAP dosing at 750 mg iv q 48 hours.   Ulice Dash D 04/08/2015,12:19 PM

## 2015-04-08 NOTE — Progress Notes (Signed)
Palliative Care Update   Palliative Care Consult has been initiated.    I have reviewed pt's available record, examined her, and discussed her history, how she was found a the time of this admission, and her wishes for future care options and where she will live.  She refuses all facility placement options.  She wants to go back to living in a trailer with her ex-husband and his girlfriend in [place] (she pays them $250/ month).  She doesn't like living with her daughter because of the noise and activity of her children.  She says she was 'temporarily at her daughter's home since her last hospital stay and she was getting Home Health there and a guy named 'Clair Gulling' was visiting often (home is in [place]). She says she was under the care of Hospice for 'about 3 years' and even describes being in [place] for 5 days.  Says that was all a few years ago.  Says she got out of [facility] recently where she was given a skin graft over her cancer surgical excision site. Says she is now due to go back to that dermatologist for an appt to get it checked and says she has 'staples under the scab on her forehead'. She also claims she has $400 cash in her 'capri pants' they took off of her in the ER.   All of this has to be checked out in talking with family, but she seems fairly sure of her facts.  I have sent a message to [facility] --to get records on pt's history with them.    Will be off this weekend (as usual).  Will pick up on her care on Monday, as I assume she will still be here.   Full note to follow.     Colleen Can, MD

## 2015-04-08 NOTE — Evaluation (Signed)
Clinical/Bedside Swallow Evaluation Patient Details  Name: Lynn Butler MRN: [id number] Date of Birth: 03/28/1946  Today's Date: 04/08/2015 Time: SLP Start Time (ACUTE ONLY): 1000 SLP Stop Time (ACUTE ONLY): 1100 SLP Time Calculation (min) (ACUTE ONLY): 60 min  Past Medical History:  Past Medical History  Diagnosis Date  . COPD (chronic obstructive pulmonary disease)   . Hypertension   . Ischemic cardiomyopathy     a.   . Coronary artery disease     a. s/p multiple stenting in 2005  . History of ventricular tachycardia   . PVD (peripheral vascular disease)   . PAD (peripheral artery disease)   . HLD (hyperlipidemia)   . Chronic back pain   . MI, old   . MRSA (methicillin resistant staph aureus) culture positive     left foot wound  . CHF (congestive heart failure)   . Pneumonia    Past Surgical History:  Past Surgical History  Procedure Laterality Date  . Insert / replace / remove pacemaker    . Vascular bypass surgery Bilateral   . Cardiac catheterization N/A 01/14/2015    Procedure: Left Heart Cath;  Surgeon: Wellington Hampshire, MD;  Location: [facility];  Service: Cardiovascular;  Laterality: N/A;  . Cardiac defibrillator placement    . Appendectomy    . Cesarean section    . Abdominal surgery     HPI:  Pt is a 69 y.o. female with a known history of recent admission for sepsis, due to healthcare associated pneumonia in July 2016 history of coronary artery disease, status post recent MI, hypertension, chronic combined systolic and diastolic CHF, COPD, malnutrition, skin cancer who presents to the hospital with altered mental status. According to nursing staff, patient has been less alert at home and even unable to smoke for the past 2 or 3 days. Per EMS report, patient was found to be sitting on the couch without oxygen. Urine, as well as feces-soaked. She was brought to emergency room. Initial O2 sats were below 80s, improved to 99 on nonrebreather. Patient was  poorly responsive upon arrival, given IV fluids and became more alert. Pt has a h/o HTN, COPD, CHF, and see other in chart. Pt is requesting coffee and food.    Assessment / Plan / Recommendation Clinical Impression  Pt appeared able to tolerate trials of thin liquids and purees w/ no overt s/s of aspiration noted; no oral phase deficits noted w/ trials given. Pt is edentulous and eats soft, cooked foods. No decline in vocal quality or respiratory status during po trials noted. Pt fed self w/ min. setup assistance. Encouraged pt to take rest breaks to avoid any fatigue w/ exertion of intake. Rec. a dysphagia level 2 diet w/ thin liquids w/ aspiration precautions; meds in Puree(crushed if nec and able); assistance at meals w/ feeding d/t weakness and monitoring at meals for any s/s of aspiration.    Aspiration Risk   (reduced)    Diet Recommendation Dysphagia 2 (Fine chop);Thin   Medication Administration: Whole meds with puree Compensations: Minimize environmental distractions;Small sips/bites;Slow rate    Other  Recommendations Recommended Consults:  (Dietician) Oral Care Recommendations: Oral care BID;Staff/trained caregiver to provide oral care   Follow Up Recommendations       Frequency and Duration min 2x/week  1 week   Pertinent Vitals/Pain denied    SLP Swallow Goals  see care plan   Swallow Study Prior Functional Status   resides at home but unsure of pt's level  of ability w/ ADLs. Edentulous.    General Date of Onset: 04/07/15 Other Pertinent Information: Pt is a 69 y.o. female with a known history of recent admission for sepsis, due to healthcare associated pneumonia in July 2016 history of coronary artery disease, status post recent MI, hypertension, chronic combined systolic and diastolic CHF, COPD, malnutrition, skin cancer who presents to the hospital with altered mental status. According to nursing staff, patient has been less alert at home and even unable to smoke  for the past 2 or 3 days. Per EMS report, patient was found to be sitting on the couch without oxygen. Urine, as well as feces-soaked. She was brought to emergency room. Initial O2 sats were below 80s, improved to 99 on nonrebreather. Patient was poorly responsive upon arrival, given IV fluids and became more alert. Pt has a h/o HTN, COPD, CHF, and see other in chart. Pt is requesting coffee and food.  Type of Study: Bedside swallow evaluation Previous Swallow Assessment: none recently Diet Prior to this Study: Regular;Thin liquids (soft foods) Temperature Spikes Noted: No (wbc 27.5; today 8.8) Respiratory Status: Supplemental O2 delivered via (comment) (3 liters) History of Recent Intubation: No Behavior/Cognition: Alert;Cooperative;Pleasant mood;Distractible Oral Cavity - Dentition: Edentulous Self-Feeding Abilities: Able to feed self;Needs assist;Needs set up Patient Positioning: Upright in bed Baseline Vocal Quality: Normal Volitional Cough: Strong Volitional Swallow: Able to elicit    Oral/Motor/Sensory Function Overall Oral Motor/Sensory Function: Appears within functional limits for tasks assessed Labial ROM: Within Functional Limits Labial Symmetry: Within Functional Limits Labial Strength: Within Functional Limits Lingual ROM: Within Functional Limits Lingual Symmetry: Within Functional Limits Lingual Strength: Within Functional Limits Facial Symmetry: Within Functional Limits Mandible: Within Functional Limits   Ice Chips Ice chips: Within functional limits Presentation: Spoon (fed; 3 trials)   Thin Liquid Thin Liquid: Within functional limits Presentation: Cup;Self Fed;Straw (~4 ozs total)    Nectar Thick Nectar Thick Liquid: Not tested   Honey Thick Honey Thick Liquid: Not tested   Puree Puree: Within functional limits Presentation: Self Fed;Spoon (assisted; 4 ozs)   Solid   GO    Solid: Not tested      Orinda Kenner, MS,  CCC-SLP  Watson,Katherine 04/08/2015,4:08 PM

## 2015-04-08 NOTE — Plan of Care (Signed)
Problem: SLP Dysphagia Goals Goal: Misc Dysphagia Goal Pt will safely tolerate po diet of least restrictive consistency w/ no overt s/s of aspiration noted by Staff/pt/family x3 sessions.    

## 2015-04-08 NOTE — Progress Notes (Signed)
Took over pt care at 23:00.  Pt alert and oriented to self, place, time.  Pt sinus rhythm on the cardiac monitor.  Foley in place.  Pt afebrile. Pt is resting comfortably at this time.

## 2015-04-08 NOTE — Consult Note (Signed)
[facility] Consultation     Date: 04/08/2015,   MRN# [id number] Lynn Butler 1945-12-12 Code Status:     Code Status Orders        Start     Ordered   04/07/15 1548  Full code   Continuous     04/07/15 1547     Hosp day:@LENGTHOFSTAYDAYS @ Referring MD: @ATDPROV @     PCP:      AdmissionWeight: 113 lb 15.7 oz (51.7 kg)                 CurrentWeight: 114 lb 13.8 oz (52.1 kg)    CC/HPI   CC follow up CC resp distress/confusion   Lynn Butler is a 70 y.o. female with a known history of recent admission for sepsis, due to healthcare associated pneumonia in July 2016  history of CAD,status post recent MI, hypertension, CHF, COPD, malnutrition, skin cancer who presents to the ER  with altered mental status.  According to nursing staff, patient has been less alert at home and even unable to smoke for the past 2 or 3 days.  Per EMS report, patient was found to be sitting on the couch without oxygen. Urine, as well as feces-soaked.   She was brought to emergency room. Initial O2 sats were below 80s, improved to 99 on nonrebreather.  Patient was poorly responsive upon arrival.  Patient admitted for severe sepsis, CXR shows RLL opacity  Patient more alert awake This AM      MEDICATIONS    Home Medication:  No current outpatient prescriptions on file.  Current Medication:   Current facility-administered medications:  .  acetylcysteine (MUCOMYST) 20 % nebulizer / oral solution 2 mL, 2 mL, Nebulization, BID, Theodoro Grist, MD, 2 mL at 04/07/15 2011 .  antiseptic oral rinse (CPC / CETYLPYRIDINIUM CHLORIDE 0.05%) solution 7 mL, 7 mL, Mouth Rinse, q12n4p, Theodoro Grist, MD .  chlorhexidine (PERIDEX) 0.12 % solution 15 mL, 15 mL, Mouth Rinse, BID, Theodoro Grist, MD, 15 mL at 04/07/15 2341 .  enoxaparin (LOVENOX) injection 40 mg, 40 mg, Subcutaneous, Q24H, Vaughan Basta, MD .  feeding supplement (ENSURE ENLIVE) (ENSURE ENLIVE) liquid 237 mL,  237 mL, Oral, TID WC, Vaughan Basta, MD .  HYDROmorphone (DILAUDID) tablet 2 mg, 2 mg, Oral, Q4H PRN, Theodoro Grist, MD, 2 mg at 04/08/15 0805 .  ipratropium-albuterol (DUONEB) 0.5-2.5 (3) MG/3ML nebulizer solution 3 mL, 3 mL, Nebulization, Q4H, Theodoro Grist, MD, 3 mL at 04/08/15 0732 .  Levofloxacin (LEVAQUIN) IVPB 250 mg, 250 mg, Intravenous, Q24H, Theodoro Grist, MD .  mometasone-formoterol (DULERA) 100-5 MCG/ACT inhaler 2 puff, 2 puff, Inhalation, BID, Theodoro Grist, MD, 2 puff at 04/08/15 0756 .  mupirocin ointment (BACTROBAN) 2 %, , Nasal, BID, Lance Coon, MD .  piperacillin-tazobactam (ZOSYN) IVPB 4.5 g, 4.5 g, Intravenous, 3 times per day, Theodoro Grist, MD, 4.5 g at 04/08/15 0540 .  sodium chloride 0.9 % injection 3 mL, 3 mL, Intravenous, Q12H, Theodoro Grist, MD, 3 mL at 04/08/15 0131 .  vancomycin (VANCOCIN) IVPB 750 mg/150 ml premix, 750 mg, Intravenous, Q24H, Vaughan Basta, MD, 750 mg at 04/08/15 0756     ALLERGIES   Nsaids     REVIEW OF SYSTEMS   Review of Systems  Constitutional: Positive for weight loss and malaise/fatigue. Negative for fever, chills and diaphoresis.  HENT: Negative for congestion and hearing loss.   Eyes: Negative for blurred vision and double vision.  Respiratory: Positive for cough, shortness of breath and wheezing.  Cardiovascular: Negative for chest pain, palpitations and orthopnea.  Gastrointestinal: Negative for heartburn, nausea, vomiting, abdominal pain, diarrhea, constipation and blood in stool.  Genitourinary: Negative for dysuria and urgency.  Musculoskeletal: Negative for myalgias, back pain and neck pain.  Skin: Negative for rash.  Neurological: Positive for dizziness and weakness. Negative for tingling, tremors and headaches.  Endo/Heme/Allergies: Does not bruise/bleed easily.  Psychiatric/Behavioral: The patient is nervous/anxious.   All other systems reviewed and are negative.    VS: BP 107/68 mmHg  Pulse 71   Temp(Src) 97.9 F (36.6 C) (Oral)  Resp 20  Wt 114 lb 13.8 oz (52.1 kg)  SpO2 100%     PHYSICAL EXAM   Physical Exam  Constitutional:  Thin and cachectic  HENT:  Head: Normocephalic and atraumatic.  Eyes: Conjunctivae are normal. Pupils are equal, round, and reactive to light.  Neck: Normal range of motion.  Cardiovascular: Normal rate and regular rhythm.   Pulmonary/Chest: She has rales.  Mild resp discomfort  Abdominal: Soft. Bowel sounds are normal.  Musculoskeletal: Normal range of motion.  Neurological: She is alert. She displays normal reflexes. No cranial nerve deficit.  Skin: Skin is warm and dry.        LABS    Recent Labs     04/07/15  1235  04/08/15  0338  HGB  11.5*  9.2*  HCT  39.2  30.2*  MCV  70.9*  70.7*  WBC  27.5*  8.8  BUN  51*  45*  CREATININE  2.11*  1.30*  GLUCOSE  131*  131*  CALCIUM  8.2*  7.8*  INR  1.39   --   ,    No results for input(s): PH in the last 72 hours.  Invalid input(s): PCO2, PO2, BASEEXCESS, BASEDEFICITE, TFT    CULTURE RESULTS   Recent Results (from the past 240 hour(s))  Urine culture     Status: None (Preliminary result)   Collection Time: 04/07/15  1:18 PM  Result Value Ref Range Status   Specimen Description URINE, CLEAN CATCH  Final   Special Requests NONE  Final   Culture MULTIPLE SPECIES PRESENT, SUGGEST RECOLLECTION  Final   Report Status PENDING  Incomplete  Surgical pcr screen     Status: Abnormal   Collection Time: 04/07/15  4:00 PM  Result Value Ref Range Status   MRSA, PCR POSITIVE (A) NEGATIVE Final   Staphylococcus aureus POSITIVE (A) NEGATIVE Final    Comment:        The Xpert SA Assay (FDA approved for NASAL specimens in patients over 3 years of age), is one component of a comprehensive surveillance program.  Test performance has been validated by [facility] for patients greater than or equal to 16 year old. It is not intended to diagnose infection nor to guide or monitor  treatment. CRITICAL RESULT CALLED TO, READ BACK BY AND VERIFIED WITH: CHARLIE SLEETWOOD AT 1809 ON 04/07/15 BY MLZ            IMAGING    Ct Head Wo Contrast  04/07/2015   CLINICAL DATA:  Per EMS report, patient lives at home with family, has recent diagnosis of pneumonia, and has been less alert, unable to smoke for the last 2-3 days. EMS report they found the patient without oxygen sitting on a couch, which was urine-soaked, had feces and mucous on it. EMS report states O2 was available in the house. EMS states O2 sats were below 80% upon their arrival. Patient was placed  on NRB and arrived with O2 sats at 99%. Patient was unresponsive upon arrival, leaning over to the right. Staff was unable to straighten patient to mid-line.  EXAM: CT HEAD WITHOUT CONTRAST  TECHNIQUE: Contiguous axial images were obtained from the base of the skull through the vertex without intravenous contrast.  COMPARISON:  10/19/2010  FINDINGS: There is mild central and cortical atrophy There is no intra or extra-axial fluid collection or mass lesion. The basilar cisterns and ventricles have a normal appearance. There is no CT evidence for acute infarction or hemorrhage. There is atherosclerotic calcification of the internal carotid arteries. Bone windows are otherwise unremarkable. There is irregularity of the frontal scalp region raising the question of laceration. No underlying fracture.  IMPRESSION: 1. Mild atrophy. 2.  No evidence for acute intracranial abnormality. 3. Question of laceration of the forehead.   Electronically Signed   By: Nolon Nations M.D.   On: 04/07/2015 17:08   Dg Chest Port 1 View  04/07/2015   CLINICAL DATA:  Recent diagnosis of pneumonia. Decreased oxygen saturation.  EXAM: PORTABLE CHEST - 1 VIEW  COMPARISON:  02/11/2015  FINDINGS: Cardiac pacemaker is seen with lead overlying the expected location of right ventricle.  Cardiomediastinal silhouette is normal. Mediastinal contours appear intact.   There is no evidence pneumothorax. There is airspace consolidation in the right lower lung, consistent with the given history of pneumonia.  Osseous structures are without acute abnormality. Soft tissues are grossly normal.  IMPRESSION: Right lower lung airspace consolidation, likely representing pneumonia.   Electronically Signed   By: Fidela Salisbury M.D.   On: 04/07/2015 13:04       ASSESSMENT/PLAN   69 yo white female with acute resp failure from acute COPD exacerbation with acute RLL pneumonia  1.oxygen as needed 2.start steroids 3.dulera/spiriva/albuterolnebs/pulmicort nebs 4.continue iv abx as prescribed  Agree with Palliative care consult   I have personally obtained a history, examined the patient, evaluated laboratory and independently reviewed imaging results, formulated the assessment and plan and placed orders.  The Patient requires high complexity decision making for assessment and support, frequent evaluation and titration of therapies, application of advanced monitoring technologies and extensive interpretation of multiple databases. Time Spent with Patient 40 mins    Corrin Parker, M.D.  [facility]  Medical Director [facility] Director [facility] Cardio-Pulmonary Department

## 2015-04-08 NOTE — Progress Notes (Signed)
Alert and oriented x4. Frequently c/o pain "from shoulders down".  Taking oxycontin scheduled and dilaudid prn.  Usually rates pain a "10".  Dr Marthann Schiller and Dr Megan Salon in to see.  They are aware of comfort issues. Pt understands we are trying to keep her comfortable but at same time allow her to breathe adequately. Lung sounds are diminished throughout. Productive cough of thick yellow sputum x1.  WBC's improving. On zosyn and vanco.  Her appetite has been good. She has limited ROM of arms due to pain but feeds herself when things are set up for her.  Social worker is aware of this pt.  Today pt  prefers to go home with her ex husband and his friend at discharge.  Report given to nurse and pt transferred to rm 103

## 2015-04-08 NOTE — Progress Notes (Addendum)
Initial Nutrition Assessment  DOCUMENTATION CODES:   Severe malnutrition in context of chronic illness  INTERVENTION:   Meals and Snacks: Cater to patient preferences Medical Food Supplement Therapy: recommend Ensure Enlive po TID, each supplement provides 350 kcal and 20 grams of protein  NUTRITION DIAGNOSIS:   Malnutrition related to chronic illness, poor appetite as evidenced by severe depletion of body fat, severe depletion of muscle mass.  GOAL:   Patient will meet greater than or equal to 90% of their needs  MONITOR:    (Energy Intake, Anthropometrics, Digestive system, Electrolyte/Renal Profile)  REASON FOR ASSESSMENT:   Rounds (Dysphagia Diet)    ASSESSMENT:    Pt admitted with sepsis with RLL pneumonia, AMS, ARF; noted pt found on couch without her oxygen sitting in urine and feces with ants crawling out of her socks, noted social work consult; pt with skin cancer, large lesion on forehead, noted palliative care consulted  Past Medical History  Diagnosis Date  . COPD (chronic obstructive pulmonary disease)   . Hypertension   . Ischemic cardiomyopathy     a.   . Coronary artery disease     a. s/p multiple stenting in 2005  . History of ventricular tachycardia   . PVD (peripheral vascular disease)   . PAD (peripheral artery disease)   . HLD (hyperlipidemia)   . Chronic back pain   . MI, old   . MRSA (methicillin resistant staph aureus) culture positive     left foot wound  . CHF (congestive heart failure)   . Pneumonia     Diet Order:  DIET DYS 2 Room service appropriate?: Yes with Assist; Fluid consistency:: Thin   Energy Intake: pt ate 25% of dysphagia diet at breakfast this AM, on honey liquids at that time. Pt requesting coffee. Seen by SLP this AM and diet upgrade  Food and nutrition related history: pt reports she had not eaten anything for 5 days prior to admission, nothing to eat or drink. Pt reports appetite prior to this was not good; typically  only eats 1 meal per day of whatever her daugther makes for dinner; does not eat breakfast or lunch. Pt likes Ensure; reports she has been drinking. However, on previous admissions pt reports she likes but does not regularly drink because she cannot afford it  Nutrition Focused Physical Exam: Nutrition-Focused physical exam completed. Findings are moderate to severe fat depletion, moderate to severe muscle depletion, and no edema.     Recent Labs Lab 04/07/15 1235 04/08/15 0338  NA 145 145  K 4.8 3.4*  CL 105 107  CO2 27 29  BUN 51* 45*  CREATININE 2.11* 1.30*  CALCIUM 8.2* 7.8*  GLUCOSE 131* 131*    Protein Profile:   Recent Labs Lab 04/07/15 1235  ALBUMIN 3.0*   Meds: prednisone  Height:   Ht Readings from Last 1 Encounters:  02/06/15 5\' 4"  (1.626 m)    Weight:   Wt Readings from Last 1 Encounters:  04/07/15 114 lb 13.8 oz (52.1 kg)   Filed Weights   04/07/15 1210 04/07/15 1600  Weight: 113 lb 15.7 oz (51.7 kg) 114 lb 13.8 oz (52.1 kg)    BMI:  Body mass index is 19.71 kg/(m^2).  Estimated Nutritional Needs:   Kcal:  1481-1750 kcals (BEE 1036, 1.3 AF, 1.1-1.3 IF)   Protein:  57-73 g (1.1-1.4 g/kg)   Fluid:  1300-1560 mL   HIGH Care Level  Kerman Passey MS, RD, LDN [phone] Pager
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
## 2015-04-08 NOTE — Progress Notes (Signed)
Patient ordered Lovenox 30 mg daily for DVT prophylaxis. SCr has improved to 1.3 with CrCl now 34.1 ml/min. Will increase Lovenox to 40 mg daily.

## 2015-04-09 DIAGNOSIS — C4490 Unspecified malignant neoplasm of skin, unspecified: Secondary | ICD-10-CM | POA: Diagnosis not present

## 2015-04-09 DIAGNOSIS — J9621 Acute and chronic respiratory failure with hypoxia: Secondary | ICD-10-CM

## 2015-04-09 DIAGNOSIS — J189 Pneumonia, unspecified organism: Secondary | ICD-10-CM | POA: Diagnosis not present

## 2015-04-09 DIAGNOSIS — G9341 Metabolic encephalopathy: Secondary | ICD-10-CM | POA: Diagnosis not present

## 2015-04-09 DIAGNOSIS — A419 Sepsis, unspecified organism: Secondary | ICD-10-CM | POA: Diagnosis not present

## 2015-04-09 LAB — CBC
HEMATOCRIT: 30.2 % — AB (ref 35.0–47.0)
Hemoglobin: 9.1 g/dL — ABNORMAL LOW (ref 12.0–16.0)
MCH: 21.2 pg — AB (ref 26.0–34.0)
MCHC: 30.2 g/dL — ABNORMAL LOW (ref 32.0–36.0)
MCV: 70.2 fL — AB (ref 80.0–100.0)
PLATELETS: 178 10*3/uL (ref 150–440)
RBC: 4.31 MIL/uL (ref 3.80–5.20)
RDW: 19.1 % — AB (ref 11.5–14.5)
WBC: 9 10*3/uL (ref 3.6–11.0)

## 2015-04-09 LAB — BASIC METABOLIC PANEL
ANION GAP: 7 (ref 5–15)
BUN: 18 mg/dL (ref 6–20)
CALCIUM: 7.7 mg/dL — AB (ref 8.9–10.3)
CO2: 31 mmol/L (ref 22–32)
Chloride: 103 mmol/L (ref 101–111)
Creatinine, Ser: 0.8 mg/dL (ref 0.44–1.00)
GFR calc Af Amer: >60 mL/min (ref >60)
GLUCOSE: 84 mg/dL (ref 65–99)
Potassium: 2.6 mmol/L — CL (ref 3.5–5.1)
Sodium: 141 mmol/L (ref 135–145)

## 2015-04-09 LAB — URINE CULTURE

## 2015-04-09 LAB — EXPECTORATED SPUTUM ASSESSMENT W REFEX TO RESP CULTURE: SPECIAL REQUESTS: NORMAL

## 2015-04-09 LAB — C DIFFICILE QUICK SCREEN W PCR REFLEX
C DIFFICILE (CDIFF) INTERP: NEGATIVE
C DIFFICILE (CDIFF) TOXIN: NEGATIVE
C Diff antigen: NEGATIVE

## 2015-04-09 LAB — POTASSIUM: Potassium: 3.5 mmol/L (ref 3.5–5.1)

## 2015-04-09 LAB — EXPECTORATED SPUTUM ASSESSMENT W GRAM STAIN, RFLX TO RESP C

## 2015-04-09 MED ORDER — POTASSIUM CHLORIDE CRYS ER 20 MEQ PO TBCR
40.0000 meq | EXTENDED_RELEASE_TABLET | ORAL | Status: AC
  Administered 2015-04-09 (×2): 40 meq via ORAL
  Filled 2015-04-09: qty 2

## 2015-04-09 MED ORDER — LEVOFLOXACIN IN D5W 750 MG/150ML IV SOLN
750.0000 mg | INTRAVENOUS | Status: DC
  Filled 2015-04-09 (×2): qty 150

## 2015-04-09 MED ORDER — ALBUTEROL SULFATE (2.5 MG/3ML) 0.083% IN NEBU
2.5000 mg | INHALATION_SOLUTION | Freq: Two times a day (BID) | RESPIRATORY_TRACT | Status: DC
  Administered 2015-04-09 – 2015-04-11 (×4): 2.5 mg via RESPIRATORY_TRACT
  Filled 2015-04-09 (×4): qty 3

## 2015-04-09 MED ORDER — GUAIFENESIN-CODEINE 100-10 MG/5ML PO SOLN
10.0000 mL | ORAL | Status: DC | PRN
  Administered 2015-04-09 – 2015-04-11 (×13): 10 mL via ORAL
  Filled 2015-04-09 (×13): qty 10

## 2015-04-09 MED ORDER — LEVOFLOXACIN 750 MG PO TABS
750.0000 mg | ORAL_TABLET | Freq: Every day | ORAL | Status: DC
  Administered 2015-04-09 – 2015-04-11 (×3): 750 mg via ORAL
  Filled 2015-04-09 (×3): qty 1

## 2015-04-09 MED ORDER — POTASSIUM CHLORIDE 10 MEQ/100ML IV SOLN
10.0000 meq | INTRAVENOUS | Status: DC
  Filled 2015-04-09 (×4): qty 100

## 2015-04-09 MED ORDER — VANCOMYCIN HCL IN DEXTROSE 1-5 GM/200ML-% IV SOLN
1000.0000 mg | INTRAVENOUS | Status: DC
  Administered 2015-04-10 – 2015-04-11 (×3): 1000 mg via INTRAVENOUS
  Filled 2015-04-09 (×4): qty 200

## 2015-04-09 MED ORDER — POTASSIUM CHLORIDE CRYS ER 20 MEQ PO TBCR
20.0000 meq | EXTENDED_RELEASE_TABLET | Freq: Two times a day (BID) | ORAL | Status: DC
  Administered 2015-04-09 – 2015-04-11 (×5): 20 meq via ORAL
  Filled 2015-04-09 (×7): qty 1

## 2015-04-09 NOTE — Evaluation (Signed)
Physical Therapy Evaluation Patient Details Name: Lynn Butler MRN: [id number] DOB: 1945/10/07 Today's Date: 04/09/2015   History of Present Illness  Pt admitted for sepsis and AMS secondary to HCAP. Pt with history of CAD, MI, HTN, and skin cancer. Pt with large wound on forehead. Per chart review, pt found in home on room air (wears chronic O2) and covered in feces/urine with ants crawling out of socks. Pt currently on isolation for MRSA.   Clinical Impression  Pt is a pleasant 69 year old female who was admitted for sepsis and AMS secondary to HCAP. Pt performs bed mobility/transfers with min assist and ambulation with cga and rw. Pt somewhat self limiting in performance and refuses to ambulate further secondary to fatigue. Heavy encouragement required for participation. All mobility performed on 3L of O2 with sats remaining WNL without SOB symptoms noted. Pt demonstrates deficits with strength/mobility/endurance/balance. Would benefit from skilled PT to address above deficits and promote optimal return to PLOF.       Follow Up Recommendations SNF    Equipment Recommendations  Rolling walker with 5" wheels    Recommendations for Other Services       Precautions / Restrictions Precautions Precautions:  (isolation) Restrictions Weight Bearing Restrictions: No      Mobility  Bed Mobility Overal bed mobility: Needs Assistance Bed Mobility: Supine to Sit     Supine to sit: Min assist     General bed mobility comments: pt able to initiate transfer, however requires assist to scoot towards EOB as well as cues for sequencing. Once sitting at EOB, pt able to sit with cga.  Transfers Overall transfer level: Needs assistance Equipment used: Rolling walker (2 wheeled) Transfers: Sit to/from Stand Sit to Stand: Min assist         General transfer comment: sit<>Stand with rw. Pt requires cues for correct/safe hand placement. Once standing, pt with kyphotic posture with cues  for upright standing. All mobility performed on 3L of O2. Sats WNL  Ambulation/Gait Ambulation/Gait assistance: Min guard Ambulation Distance (Feet): 3 Feet Assistive device: Rolling walker (2 wheeled) Gait Pattern/deviations: Step-to pattern   Gait velocity interpretation: Below normal speed for age/gender General Gait Details: ambulated to recliner with rw and slow gait speed. Pt required cues for sequencing and required use of rw for safety. Pt fatigues quickly and refuses further ambulation. O2 sats at 93% post mobility.  Stairs            Wheelchair Mobility    Modified Rankin (Stroke Patients Only)       Balance Overall balance assessment: Needs assistance Sitting-balance support: Bilateral upper extremity supported Sitting balance-Leahy Scale: Good     Standing balance support: Bilateral upper extremity supported Standing balance-Leahy Scale: Fair                               Pertinent Vitals/Pain Pain Assessment: No/denies pain    Home Living Family/patient expects to be discharged to:: Private residence Living Arrangements: Non-relatives/Friends Available Help at Discharge: Friend(s) Type of Home: House Home Access: Stairs to enter Entrance Stairs-Rails: Can reach both Entrance Stairs-Number of Steps: 4 Home Layout: One level Home Equipment:  (reports having no equipment at home?) Additional Comments: previous note reports pt having rollater/rw/WC etc    Prior Function Level of Independence: Independent               Hand Dominance        Extremity/Trunk Assessment  Upper Extremity Assessment: Generalized weakness (B UE grossly 3/5; )           Lower Extremity Assessment: Generalized weakness;LLE deficits/detail (B LE grossly 4/5)         Communication   Communication: No difficulties;Other (comment)  Cognition Arousal/Alertness: Awake/alert Behavior During Therapy: WFL for tasks assessed/performed Overall  Cognitive Status: Within Functional Limits for tasks assessed                      General Comments      Exercises        Assessment/Plan    PT Assessment Patient needs continued PT services  PT Diagnosis Difficulty walking;Generalized weakness   PT Problem List Decreased strength;Decreased knowledge of use of DME;Decreased safety awareness;Decreased mobility;Decreased activity tolerance  PT Treatment Interventions Gait training;DME instruction;Therapeutic exercise   PT Goals (Current goals can be found in the Care Plan section) Acute Rehab PT Goals Patient Stated Goal: to go to [facility] PT Goal Formulation: With patient Time For Goal Achievement: 04/23/15 Potential to Achieve Goals: Fair    Frequency Min 2X/week   Barriers to discharge        Co-evaluation               End of Session Equipment Utilized During Treatment: Gait belt;Oxygen Activity Tolerance: Patient limited by fatigue;Patient limited by pain Patient left: in chair;with chair alarm set Nurse Communication: Mobility status         Time: 0277-4128 PT Time Calculation (min) (ACUTE ONLY): 27 min   Charges:   PT Evaluation $Initial PT Evaluation Tier I: 1 Procedure     PT G Codes:        Oceanna Arruda 68-18-16, 3:35 PM  Greggory Stallion, PT, DPT [phone]

## 2015-04-09 NOTE — Progress Notes (Addendum)
VSS. Continues 2L O2 per [place] with O2 sats 93%. Chronic generalized pain managed with oxycontin and dilaudid. Cough med given x2 with improvement. Tolerates diet. No c/o n/v. 2xloose stools today, negative for C diff. Foley removed, pt voided post foley removal.

## 2015-04-09 NOTE — Progress Notes (Addendum)
ANTIBIOTIC CONSULT NOTE - FOLLOW UP   Pharmacy Consult for Levaquin/Zosyn/Vancomycin Indication: sepsis  Allergies  Allergen Reactions  . Nsaids Nausea And Vomiting and Other (See Comments)    Reaction:  GI distress and burning     Patient Measurements: Height: 5\' 4"  (162.6 cm) Weight: 117 lb 1.6 oz (53.116 kg) IBW/kg (Calculated) : 54.7 Adjusted Body Weight: 51.7 kg  Vital Signs: Temp: 98.2 F (36.8 C) (09/10 0552) Temp Source: Oral (09/10 0552) BP: 114/61 mmHg (09/10 0552) Pulse Rate: 75 (09/10 0552) Intake/Output from previous day: 09/09 0701 - 09/10 0700 In: 1410 [P.O.:960; IV Piggyback:450] Out: 1276 [Urine:1275; Stool:1] Intake/Output from this shift:    Labs:  Recent Labs  04/07/15 1235 04/08/15 0338 04/09/15 0423  WBC 27.5* 8.8 9.0  HGB 11.5* 9.2* 9.1*  PLT 265 167 178  CREATININE 2.11* 1.30* 0.80   Estimated Creatinine Clearance: 56.4 mL/min (by C-G formula based on Cr of 0.8). No results for input(s): VANCOTROUGH, VANCOPEAK, VANCORANDOM, GENTTROUGH, GENTPEAK, GENTRANDOM, TOBRATROUGH, TOBRAPEAK, TOBRARND, AMIKACINPEAK, AMIKACINTROU, AMIKACIN in the last 72 hours.   Estimated Creatinine Clearance: 56.4 mL/min (by C-G formula based on Cr of 0.8).   Microbiology: Recent Results (from the past 720 hour(s))  Blood Culture (routine x 2)     Status: None (Preliminary result)   Collection Time: 04/07/15 12:42 PM  Result Value Ref Range Status   Specimen Description BLOOD RIGHT WRIST  Final   Special Requests NONE  Final   Culture NO GROWTH < 24 HOURS  Final   Report Status PENDING  Incomplete  Blood Culture (routine x 2)     Status: None (Preliminary result)   Collection Time: 04/07/15 12:42 PM  Result Value Ref Range Status   Specimen Description BLOOD LEFT HAND  Final   Culture NO GROWTH < 24 HOURS  Final   Report Status PENDING  Incomplete  Urine culture     Status: None (Preliminary result)   Collection Time: 04/07/15  1:18 PM  Result Value Ref  Range Status   Specimen Description URINE, CLEAN CATCH  Final   Special Requests NONE  Final   Culture MULTIPLE SPECIES PRESENT, SUGGEST RECOLLECTION  Final   Report Status PENDING  Incomplete  Surgical pcr screen     Status: Abnormal   Collection Time: 04/07/15  4:00 PM  Result Value Ref Range Status   MRSA, PCR POSITIVE (A) NEGATIVE Final   Staphylococcus aureus POSITIVE (A) NEGATIVE Final    Comment:        The Xpert SA Assay (FDA approved for NASAL specimens in patients over 8 years of age), is one component of a comprehensive surveillance program.  Test performance has been validated by [facility] for patients greater than or equal to 10 year old. It is not intended to diagnose infection nor to guide or monitor treatment. CRITICAL RESULT CALLED TO, READ BACK BY AND VERIFIED WITH: CHARLIE SLEETWOOD AT 1809 ON 04/07/15 BY MLZ    Culture, sputum-assessment     Status: None (Preliminary result)   Collection Time: 04/08/15  6:00 AM  Result Value Ref Range Status   Specimen Description SPUTUM  Final   Special Requests Normal  Final   Sputum evaluation THIS SPECIMEN IS ACCEPTABLE FOR SPUTUM CULTURE  Final   Report Status PENDING  Incomplete    Medical History: Past Medical History  Diagnosis Date  . COPD (chronic obstructive pulmonary disease)   . Hypertension   . Ischemic cardiomyopathy     a.   Marland Kitchen  Coronary artery disease     a. s/p multiple stenting in 2005  . History of ventricular tachycardia   . PVD (peripheral vascular disease)   . PAD (peripheral artery disease)   . HLD (hyperlipidemia)   . Chronic back pain   . MI, old   . MRSA (methicillin resistant staph aureus) culture positive     left foot wound  . CHF (congestive heart failure)   . Pneumonia     Medications:  Prescriptions prior to admission  Medication Sig Dispense Refill Last Dose  . diazepam (VALIUM) 10 MG tablet Take 1 tablet (10 mg total) by mouth every 12 (twelve) hours as needed for anxiety.  30 tablet 0 unknown  . diphenoxylate-atropine (LOMOTIL) 2.5-0.025 MG per tablet Take 1 tablet by mouth every 6 (six) hours as needed for diarrhea or loose stools.   unknown  . HYDROmorphone (DILAUDID) 4 MG tablet Take 1 tablet (4 mg total) by mouth every 6 (six) hours as needed for severe pain. (Patient taking differently: Take 4 mg by mouth every 12 (twelve) hours as needed for severe pain. ) 15 tablet 0 unknown  . OxyCODONE (OXYCONTIN) 80 mg T12A 12 hr tablet Take 1 tablet (80 mg total) by mouth every 12 (twelve) hours. 14 tablet 0 unknown  . zolpidem (AMBIEN) 10 MG tablet Take 1 tablet (10 mg total) by mouth at bedtime as needed for sleep. 10 tablet 0 unknown  . acetylcysteine (MUCOMYST) 20 % nebulizer solution Take 2 mLs by nebulization 2 (two) times daily. (Patient not taking: Reported on 04/07/2015) 30 mL 12 Not Taking at Unknown time  . albuterol (PROAIR HFA) 108 (90 BASE) MCG/ACT inhaler Inhale 1-2 puffs into the lungs every 4 (four) hours as needed for wheezing or shortness of breath. (Patient not taking: Reported on 04/07/2015)   Not Taking at Unknown time  . atorvastatin (LIPITOR) 40 MG tablet Take 1 tablet (40 mg total) by mouth daily at 6 PM. (Patient not taking: Reported on 04/07/2015) 30 tablet 0 Not Taking at Unknown time  . benzonatate (TESSALON) 200 MG capsule Take 1 capsule (200 mg total) by mouth 3 (three) times daily as needed for cough. (Patient not taking: Reported on 04/07/2015) 20 capsule 0 Not Taking at Unknown time  . carvedilol (COREG) 3.125 MG tablet Take 1 tablet (3.125 mg total) by mouth 2 (two) times daily with a meal. (Patient not taking: Reported on 04/07/2015) 60 tablet 2 Not Taking at Unknown time  . feeding supplement, ENSURE ENLIVE, (ENSURE ENLIVE) LIQD Take 237 mLs by mouth 3 (three) times daily with meals. (Patient not taking: Reported on 04/07/2015) 237 mL 12 Not Taking at Unknown time  . furosemide (LASIX) 20 MG tablet Take 1 tablet (20 mg total) by mouth daily. (Patient not  taking: Reported on 04/07/2015) 30 tablet 0 Not Taking at Unknown time  . guaiFENesin (MUCINEX) 600 MG 12 hr tablet Take 1 tablet (600 mg total) by mouth 2 (two) times daily. (Patient not taking: Reported on 04/07/2015) 20 tablet 0 Not Taking at Unknown time  . ipratropium-albuterol (DUONEB) 0.5-2.5 (3) MG/3ML SOLN Take 3 mLs by nebulization every 6 (six) hours as needed. (Patient not taking: Reported on 04/07/2015) 360 mL 0 Not Taking at Unknown time  . isosorbide mononitrate (IMDUR) 30 MG 24 hr tablet Take 1 tablet (30 mg total) by mouth daily. (Patient not taking: Reported on 04/07/2015) 30 tablet 0 Not Taking at Unknown time  . LORazepam (ATIVAN) 1 MG tablet Take 1 tablet (1  mg total) by mouth every 8 (eight) hours as needed for anxiety. (Patient not taking: Reported on 04/07/2015) 15 tablet 0 Not Taking at Unknown time  . piperacillin-tazobactam (ZOSYN) 3.375 GM/50ML IVPB Inject 50 mLs (3.375 g total) into the vein every 8 (eight) hours. (Patient not taking: Reported on 04/07/2015) 300 mL 0 Not Taking at Unknown time  . vancomycin 500 mg in sodium chloride 0.9 % 100 mL Inject 500 mg into the vein every 8 (eight) hours. (Patient not taking: Reported on 04/07/2015) 6 Dose 0 Not Taking at Unknown time   Assessment: 69 y/o F admitted with sepsis and RLL PNA ordered empiric abx.   Updated PK based on Scr from 9/10: Kel (hr-1): 0.051 Half-life (hrs): 13.59 Vd (liters): 37.17 (factor used: 0.7 L/kg)  Goal of Therapy:  Vancomycin trough level 15-20 mcg/ml  Plan:  1. Continue Zosyn as ordered.  2. Will change Vancomycin to 1 g IV q18 hours. Trough level ordered to be drawn prior to the 06:00 dose on 9/13.   Will increase Levofloxacin to 750 mg q24 hours. Patient qualifies for IV to PO transition therefore will change to PO levofloxacin.    Juanisha Bautch D 04/09/2015,9:09 AM

## 2015-04-09 NOTE — Consult Note (Signed)
[facility] Consultation     Date: 04/09/2015,   MRN# [id number] Lynn Butler [birth year] Code Status:     Code Status Orders        Start     Ordered   04/07/15 1548  Full code   Continuous     04/07/15 1547     Hosp day:@LENGTHOFSTAYDAYS @ Referring MD: @ATDPROV @     PCP:      AdmissionWeight: 113 lb 15.7 oz (51.7 kg)                 CurrentWeight: 117 lb 1.6 oz (53.116 kg)    CC/HPI   CC follow up CC resp distress/confusion   Lynn Butler is a 69 y.o. female with a known history of recent admission for sepsis, due to healthcare associated pneumonia in July 2016  history of CAD,status post recent MI, hypertension, CHF, COPD, malnutrition, skin cancer who presents to the ER  with altered mental status.  According to nursing staff, patient has been less alert at home and even unable to smoke for the past 2 or 3 days.  Per EMS report, patient was found to be sitting on the couch without oxygen. Urine, as well as feces-soaked.   She was brought to emergency room. Initial O2 sats were below 80s, improved to 99 on nonrebreather.  Patient was poorly responsive upon arrival.  Patient admitted for severe sepsis, CXR shows RLL opacity  SUBJECTIVE: Patient awake and alert this morning, complained of mild nonproductive cough this AM, had significant cough overnight, was started on PRN cough suppressant.     MEDICATIONS    Home Medication:  No current outpatient prescriptions on file.  Current Medication:   Current facility-administered medications:  .  albuterol (PROVENTIL) (2.5 MG/3ML) 0.083% nebulizer solution 2.5 mg, 2.5 mg, Nebulization, Q4H, Flora Lipps, MD, 2.5 mg at 04/09/15 0800 .  antiseptic oral rinse (CPC / CETYLPYRIDINIUM CHLORIDE 0.05%) solution 7 mL, 7 mL, Mouth Rinse, q12n4p, Theodoro Grist, MD, 7 mL at 04/08/15 1200 .  budesonide (PULMICORT) nebulizer solution 0.5 mg, 0.5 mg, Nebulization, BID, Flora Lipps, MD, 0.5 mg at  04/09/15 0800 .  chlorhexidine (PERIDEX) 0.12 % solution 15 mL, 15 mL, Mouth Rinse, BID, Theodoro Grist, MD, 15 mL at 04/08/15 2153 .  enoxaparin (LOVENOX) injection 40 mg, 40 mg, Subcutaneous, Q24H, Vaughan Basta, MD, 40 mg at 04/08/15 1732 .  feeding supplement (ENSURE ENLIVE) (ENSURE ENLIVE) liquid 237 mL, 237 mL, Oral, TID WC, Vaughan Basta, MD, 237 mL at 04/09/15 0940 .  guaiFENesin-codeine 100-10 MG/5ML solution 10 mL, 10 mL, Oral, Q4H PRN, Lytle Butte, MD, 10 mL at 04/09/15 [phone] .  HYDROmorphone (DILAUDID) tablet 2 mg, 2 mg, Oral, Q4H PRN, Theodoro Grist, MD, 2 mg at 04/09/15 0559 .  levofloxacin (LEVAQUIN) tablet 750 mg, 750 mg, Oral, Daily, Theodoro Grist, MD, 750 mg at 04/09/15 0942 .  mometasone-formoterol (DULERA) 100-5 MCG/ACT inhaler 2 puff, 2 puff, Inhalation, BID, Theodoro Grist, MD, 2 puff at 04/09/15 0744 .  mupirocin ointment (BACTROBAN) 2 %, , Nasal, BID, Lance Coon, MD .  OxyCODONE (OXYCONTIN) 12 hr tablet 40 mg, 40 mg, Oral, Q12H, Vaughan Basta, MD, 40 mg at 04/09/15 0940 .  piperacillin-tazobactam (ZOSYN) IVPB 4.5 g, 4.5 g, Intravenous, 3 times per day, Theodoro Grist, MD, 4.5 g at 04/09/15 0559 .  potassium chloride SA (K-DUR,KLOR-CON) CR tablet 20 mEq, 20 mEq, Oral, BID, Henreitta Leber, MD, 20 mEq at 04/09/15 0940 .  potassium chloride SA (K-DUR,KLOR-CON) CR  tablet 40 mEq, 40 mEq, Oral, Q4H, Gladstone Lighter, MD, 40 mEq at 04/09/15 0740 .  predniSONE (DELTASONE) tablet 40 mg, 40 mg, Oral, Q breakfast, Flora Lipps, MD .  sodium chloride 0.9 % injection 3 mL, 3 mL, Intravenous, Q12H, Theodoro Grist, MD, 3 mL at 04/09/15 0745 .  tiotropium (SPIRIVA) inhalation capsule 18 mcg, 18 mcg, Inhalation, Daily, Flora Lipps, MD, 18 mcg at 04/09/15 0743 .  [START ON 04/10/2015] vancomycin (VANCOCIN) IVPB 1000 mg/200 mL premix, 1,000 mg, Intravenous, Q18H, Theodoro Grist, MD     ALLERGIES   Nsaids     REVIEW OF SYSTEMS   Review of Systems    Constitutional: Positive for weight loss and malaise/fatigue. Negative for fever, chills and diaphoresis.  HENT: Negative for congestion and hearing loss.   Eyes: Negative for blurred vision and double vision.  Respiratory: Positive for cough, shortness of breath and wheezing.        Fine expiratory wheezing, improving.   Cardiovascular: Negative for chest pain, palpitations and orthopnea.  Gastrointestinal: Negative for heartburn, nausea, vomiting, abdominal pain, diarrhea, constipation and blood in stool.  Genitourinary: Negative for dysuria and urgency.  Musculoskeletal: Negative for myalgias, back pain and neck pain.  Skin: Negative for rash.  Neurological: Positive for weakness. Negative for dizziness, tingling, tremors and headaches.  Endo/Heme/Allergies: Does not bruise/bleed easily.  Psychiatric/Behavioral: The patient is nervous/anxious.   All other systems reviewed and are negative.    VS: BP 114/61 mmHg  Pulse 75  Temp(Src) 98.2 F (36.8 C) (Oral)  Resp 18  Ht 5\' 4"  (1.626 m)  Wt 117 lb 1.6 oz (53.116 kg)  BMI 20.09 kg/m2  SpO2 98%     PHYSICAL EXAM   Physical Exam  Constitutional:  Thin and cachectic  HENT:  Head: Normocephalic and atraumatic.  Eyes: Conjunctivae are normal. Pupils are equal, round, and reactive to light.  Neck: Normal range of motion.  Cardiovascular: Normal rate and regular rhythm.   Pulmonary/Chest: She has wheezes. She has rales.  Mild resp discomfort Fine expiratory wheeze, now improving.   Abdominal: Soft. Bowel sounds are normal.  Musculoskeletal: Normal range of motion.  Neurological: She is alert. She displays normal reflexes. No cranial nerve deficit.  Skin: Skin is warm and dry.  Forehead - large dry scaling lesion, no pus drainage.         LABS    Recent Labs     04/07/15  1235  04/08/15  0338  04/09/15  0423  HGB  11.5*  9.2*  9.1*  HCT  39.2  30.2*  30.2*  MCV  70.9*  70.7*  70.2*  WBC  27.5*  8.8  9.0  BUN   51*  45*  18  CREATININE  2.11*  1.30*  0.80  GLUCOSE  131*  131*  84  CALCIUM  8.2*  7.8*  7.7*  INR  1.39   --    --   ,    No results for input(s): PH in the last 72 hours.  Invalid input(s): PCO2, PO2, BASEEXCESS, BASEDEFICITE, TFT    CULTURE RESULTS   Recent Results (from the past 240 hour(s))  Blood Culture (routine x 2)     Status: None (Preliminary result)   Collection Time: 04/07/15 12:42 PM  Result Value Ref Range Status   Specimen Description BLOOD RIGHT WRIST  Final   Special Requests NONE  Final   Culture NO GROWTH < 24 HOURS  Final   Report Status PENDING  Incomplete  Blood Culture (routine x 2)     Status: None (Preliminary result)   Collection Time: 04/07/15 12:42 PM  Result Value Ref Range Status   Specimen Description BLOOD LEFT HAND  Final   Culture NO GROWTH < 24 HOURS  Final   Report Status PENDING  Incomplete  Urine culture     Status: None (Preliminary result)   Collection Time: 04/07/15  1:18 PM  Result Value Ref Range Status   Specimen Description URINE, CLEAN CATCH  Final   Special Requests NONE  Final   Culture MULTIPLE SPECIES PRESENT, SUGGEST RECOLLECTION  Final   Report Status PENDING  Incomplete  Surgical pcr screen     Status: Abnormal   Collection Time: 04/07/15  4:00 PM  Result Value Ref Range Status   MRSA, PCR POSITIVE (A) NEGATIVE Final   Staphylococcus aureus POSITIVE (A) NEGATIVE Final    Comment:        The Xpert SA Assay (FDA approved for NASAL specimens in patients over 39 years of age), is one component of a comprehensive surveillance program.  Test performance has been validated by [facility] for patients greater than or equal to 51 year old. It is not intended to diagnose infection nor to guide or monitor treatment. CRITICAL RESULT CALLED TO, READ BACK BY AND VERIFIED WITH: CHARLIE SLEETWOOD AT 1809 ON 04/07/15 BY MLZ    Culture, sputum-assessment     Status: None (Preliminary result)   Collection Time: 04/08/15   6:00 AM  Result Value Ref Range Status   Specimen Description SPUTUM  Final   Special Requests Normal  Final   Sputum evaluation THIS SPECIMEN IS ACCEPTABLE FOR SPUTUM CULTURE  Final   Report Status PENDING  Incomplete          IMAGING    No results found.     ASSESSMENT/PLAN   69 yo white female with acute resp failure from acute COPD exacerbation with acute RLL pneumonia  1.oxygen as needed to maintain sats >88% 2.cont with  Prednisone PO 40mg  x 5 days (total), then 2 wk taper 3.dulera/spiriva/albuterolnebs/pulmicort nebs 4.continue iv abx as prescribed (zosyn, vanc).   Agree with Palliative care consult   I have personally reviewed the history, examined the patient, evaluated laboratory and independently reviewed imaging results, formulated the assessment and plan and placed orders.  The Patient requires high complexity decision making for assessment and support, frequent evaluation and titration of therapies, application of advanced monitoring technologies and extensive interpretation of multiple databases. Time Spent with Patient 40 mins    Vilinda Boehringer, MD [facility] Pager [phone] (please enter 7-digits) On Call Pager - [phone] (please enter 7-digits)

## 2015-04-09 NOTE — Progress Notes (Signed)
Dr. Lavetta Nielsen notified that patient is coughing, requesting cough medication (robituson with codeine per patient). MD acknowledged and states that he will enter order.

## 2015-04-09 NOTE — Progress Notes (Signed)
[place] [facility] [place] NAME: Lynn Butler    MR#:  [id number]  DATE OF BIRTH:  [birth year]  SUBJECTIVE:  CHIEF COMPLAINT:   Chief Complaint  Patient presents with  . Altered Mental Status   patient here due to altered mental status from metabolic encephalopathy. Also noted to have a pneumonia. Transfer out of the ICU yesterday and complaining of pain all over. She also had 2 episodes of loose stools but it's negative for C. difficile. Patient complains of a cough which is nonproductive.  REVIEW OF SYSTEMS:    Review of Systems  Constitutional: Negative for fever and chills.  HENT: Negative for congestion and tinnitus.   Eyes: Negative for blurred vision and double vision.  Respiratory: Positive for cough, shortness of breath and wheezing.   Cardiovascular: Negative for chest pain, orthopnea and PND.  Gastrointestinal: Negative for nausea, vomiting, abdominal pain and diarrhea.  Genitourinary: Negative for dysuria and hematuria.  Neurological: Positive for weakness (generalized). Negative for dizziness, sensory change and focal weakness.  All other systems reviewed and are negative.   Nutrition: Dysphagia 2 Tolerating Diet: Yes Tolerating PT: Await evaluation  DRUG ALLERGIES:   Allergies  Allergen Reactions  . Nsaids Nausea And Vomiting and Other (See Comments)    Reaction:  GI distress and burning     VITALS:  Blood pressure 114/61, pulse 75, temperature 98.2 F (36.8 C), temperature source Oral, resp. rate 18, height 5\' 4"  (1.626 m), weight 53.116 kg (117 lb 1.6 oz), SpO2 98 %.  PHYSICAL EXAMINATION:   Physical Exam  GENERAL:  69 y.o.-year-old patient lying in the bed with no acute distress.  EYES: Pupils equal, round, reactive to light and accommodation. No scleral icterus. Extraocular muscles intact.  HEENT: Head atraumatic, normocephalic. Oropharynx and nasopharynx clear.  NECK:  Supple, no jugular venous  distention. No thyroid enlargement, no tenderness.  LUNGS: Diffuse wheezing, rhonchi bilaterally. No rales, No use of accessory muscles of respiration.  CARDIOVASCULAR: S1, S2 normal. No murmurs, rubs, or gallops.  ABDOMEN: Soft, nontender, nondistended. Bowel sounds present. No organomegaly or mass.  EXTREMITIES: No cyanosis, clubbing or edema b/l.    NEUROLOGIC: Cranial nerves II through XII are intact. No focal Motor or sensory deficits b/l. Globally weak  PSYCHIATRIC: The patient is alert and oriented x 3. Good affect SKIN: No obvious rash, lesion. Large skin cancer on the forehead with no acute bleeding.   LABORATORY PANEL:   CBC  Recent Labs Lab 04/09/15 0423  WBC 9.0  HGB 9.1*  HCT 30.2*  PLT 178   ------------------------------------------------------------------------------------------------------------------  Chemistries   Recent Labs Lab 04/07/15 1235  04/09/15 0423  NA 145  < > 141  K 4.8  < > 2.6*  CL 105  < > 103  CO2 27  < > 31  GLUCOSE 131*  < > 84  BUN 51*  < > 18  CREATININE 2.11*  < > 0.80  CALCIUM 8.2*  < > 7.7*  AST 38  --   --   ALT 24  --   --   ALKPHOS 109  --   --   BILITOT 0.9  --   --   < > = values in this interval not displayed. ------------------------------------------------------------------------------------------------------------------  Cardiac Enzymes  Recent Labs Lab 04/07/15 1235  TROPONINI 1.04*   ------------------------------------------------------------------------------------------------------------------  RADIOLOGY:  Ct Head Wo Contrast  04/07/2015   CLINICAL DATA:  Per EMS report, patient lives at home with family,  has recent diagnosis of pneumonia, and has been less alert, unable to smoke for the last 2-3 days. EMS report they found the patient without oxygen sitting on a couch, which was urine-soaked, had feces and mucous on it. EMS report states O2 was available in the house. EMS states O2 sats were below 80% upon  their arrival. Patient was placed on NRB and arrived with O2 sats at 99%. Patient was unresponsive upon arrival, leaning over to the right. Staff was unable to straighten patient to mid-line.  EXAM: CT HEAD WITHOUT CONTRAST  TECHNIQUE: Contiguous axial images were obtained from the base of the skull through the vertex without intravenous contrast.  COMPARISON:  10/19/2010  FINDINGS: There is mild central and cortical atrophy There is no intra or extra-axial fluid collection or mass lesion. The basilar cisterns and ventricles have a normal appearance. There is no CT evidence for acute infarction or hemorrhage. There is atherosclerotic calcification of the internal carotid arteries. Bone windows are otherwise unremarkable. There is irregularity of the frontal scalp region raising the question of laceration. No underlying fracture.  IMPRESSION: 1. Mild atrophy. 2.  No evidence for acute intracranial abnormality. 3. Question of laceration of the forehead.   Electronically Signed   By: Nolon Nations M.D.   On: 04/07/2015 17:08   Dg Chest Port 1 View  04/07/2015   CLINICAL DATA:  Recent diagnosis of pneumonia. Decreased oxygen saturation.  EXAM: PORTABLE CHEST - 1 VIEW  COMPARISON:  02/11/2015  FINDINGS: Cardiac pacemaker is seen with lead overlying the expected location of right ventricle.  Cardiomediastinal silhouette is normal. Mediastinal contours appear intact.  There is no evidence pneumothorax. There is airspace consolidation in the right lower lung, consistent with the given history of pneumonia.  Osseous structures are without acute abnormality. Soft tissues are grossly normal.  IMPRESSION: Right lower lung airspace consolidation, likely representing pneumonia.   Electronically Signed   By: Fidela Salisbury M.D.   On: 04/07/2015 13:04     ASSESSMENT AND PLAN:  69 year old female with past medical history of COPD, ischemic cardiomyopathy, hypertension, history of coronary disease, peripheral asked  disease, hyperlipidemia, chronic back pain, CHF, skin cancer who presented to the hospital due to altered mental status and noted to have sepsis due to pneumonia.  #1 sepsis-this was likely secondary to pneumonia right lower lobe. -Patient had a leukocytosis and was tachycardic with a positive chest x-ray findings suggestive of pneumonia. -Continue IV vancomycin, Levaquin, Zosyn. -Follow blood, sputum, urine cultures which are negative so far.  #2 right lower lobe pneumonia-likely the cause of patient's sepsis. -Continue broad-spectrum IV antibiotics with vancomycin/Zosyn/Levaquin.  #3 hypokalemia-continue to supplement potassium and repeat level in the morning. -I will check a magnesium level  #4 COPD-no acute exacerbation -Continue Spiriva, Dulera, when necessary DuoNeb's.  #5 chronic pain-continue OxyContin, Dilaudid as needed.  #6 skin cancer-patient has a large lesion on the forehead consistent with skin cancer. -Patient is status post skin grafting in the past. Continue follow-up with dermatology as an outpatient.  We'll get physical therapy evaluation to assess mobility.  All the records are reviewed and case discussed with Care Management/Social Workerr. Management plans discussed with the patient, family and they are in agreement.  CODE STATUS: Full  DVT Prophylaxis: Lovenox  TOTAL TIME TAKING CARE OF THIS PATIENT: 30 minutes.   POSSIBLE D/C IN 2-3 DAYS, DEPENDING ON CLINICAL CONDITION.   Henreitta Leber M.D on 04/09/2015 at 12:47 PM  Between 7am to 6pm - Pager - [phone]  After 6pm go to [email or web address] - password [email or web address] [facility]  [facility]  Office  [phone]  CC: Primary care physician; Juanell Fairly, MD

## 2015-04-09 NOTE — Progress Notes (Signed)
Chart reviewed. No reports of dysphagia with new diet. CNA reports Pt tolerated breakfast well. Needed to be set up but then was able to feed herself. F/u 1-3 days. Please notify SLP if any dysphagia is noted. Continue with aspiration precautions

## 2015-04-09 NOTE — Plan of Care (Signed)
Problem: COPD GOLD Progrssion Goal: Other COPD GOLD Goal(s) Outcome: Progressing Able to wean to room air- pt is not progressing, has been on oxygen at home and will not be able to be weaned as in patient. Oxygen 100% on 3Liters via [place]. Able to perform ADL's- pt is not progressing, found at home laying in urine and feces with no oxygen being supplied, pt is unable at this time to care for self, social work consult ordered.  COPD GOLD action plan- patient is admitted under COPD GOLD. Discharge appointments- continue to monitor that patient has d/c f/u appointments and indicate how she will transport to PCP appointment.   A&O patient, only confused to month. Isolation precautions for MRSA. C/o generalized all over pain improved with PRN pain medications. C/o cough, MD notified and new order obtained for PRN cough medication, effective results noted. Foley catheter continues to drain yellow urine. 2 q hours turns with mouth  Care while awake.

## 2015-04-10 DIAGNOSIS — A419 Sepsis, unspecified organism: Secondary | ICD-10-CM | POA: Diagnosis not present

## 2015-04-10 DIAGNOSIS — C4490 Unspecified malignant neoplasm of skin, unspecified: Secondary | ICD-10-CM | POA: Diagnosis not present

## 2015-04-10 LAB — CREATININE, SERUM
Creatinine, Ser: 0.69 mg/dL (ref 0.44–1.00)
GFR calc Af Amer: >60 mL/min (ref >60)
GFR calc non Af Amer: >60 mL/min (ref >60)

## 2015-04-10 LAB — CBC
HCT: 29.7 % — ABNORMAL LOW (ref 35.0–47.0)
HEMOGLOBIN: 8.9 g/dL — AB (ref 12.0–16.0)
MCH: 20.8 pg — AB (ref 26.0–34.0)
MCHC: 29.9 g/dL — AB (ref 32.0–36.0)
MCV: 69.6 fL — AB (ref 80.0–100.0)
Platelets: 227 10*3/uL (ref 150–440)
RBC: 4.27 MIL/uL (ref 3.80–5.20)
RDW: 18.6 % — ABNORMAL HIGH (ref 11.5–14.5)
WBC: 8 10*3/uL (ref 3.6–11.0)

## 2015-04-10 LAB — MAGNESIUM: Magnesium: 1.7 mg/dL (ref 1.7–2.4)

## 2015-04-10 LAB — POTASSIUM: Potassium: 4.3 mmol/L (ref 3.5–5.1)

## 2015-04-10 MED ORDER — DIAZEPAM 5 MG PO TABS
10.0000 mg | ORAL_TABLET | Freq: Two times a day (BID) | ORAL | Status: DC | PRN
  Administered 2015-04-10 – 2015-04-11 (×3): 10 mg via ORAL
  Filled 2015-04-10 (×3): qty 2

## 2015-04-10 MED ORDER — ENOXAPARIN SODIUM 40 MG/0.4ML ~~LOC~~ SOLN
40.0000 mg | SUBCUTANEOUS | Status: DC
  Administered 2015-04-10: 40 mg via SUBCUTANEOUS
  Filled 2015-04-10: qty 0.4

## 2015-04-10 NOTE — Clinical Social Work Note (Signed)
CSW spoke with patient this morning regarding recommendation for STR by PT. Patient immediately stated she would not go to a nursing home and definitely would not go to [facility] or [facility]. CSW explained the concern surrounding the conditions under which she was found and she was unaware of the conditions in which she was found by EMS. Patient states she does not remember. Patient states she is willing to have CSW initiate a bedsearch and will strongly consider going to rehab. Bedsearch initiated. Shela Leff MSW,LCSW [phone]

## 2015-04-10 NOTE — Plan of Care (Signed)
Problem: COPD GOLD Progrssion Goal: Other COPD GOLD Goal(s) Outcome: Progressing Plan of Care Progress to Goal:   Pt has report pain multiple times throughout shift. No other signs of distress noted. Will continue to monitor.

## 2015-04-10 NOTE — Plan of Care (Signed)
Problem: COPD GOLD Progrssion Goal: Provide COPD GOLD Action Plan at Discharge Outcome: Progressing Plan of Care Progress to Goal:  Lynn Butler, charge nurse on 2A abt putting COPD Gold in place for d/c.  She sd a nurse would come down and set up the post d/c calls.

## 2015-04-10 NOTE — Plan of Care (Signed)
Problem: COPD GOLD Progrssion Goal: Able to Perform ADL's at Baseline Outcome: Progressing Pt able to stand and pivot to [facility] w/standby assist. Goal: Other COPD GOLD Goal(s) Outcome: Progressing Plan of Care Progress to Goal:  Pt is on chronic 2L O2 and has been weaned to 2.5L.  Pt continues on Vanc, Levaquin and Zosyn for PNA.  Still c/o cough but improves. w/PRN cough syrup.  Hypokalemia has resolved w/PO replacement.  Short term rehab recommended by PT.  SW doing bed search.  Pt c/o generalized chronic pain - specifically her R arm/shoulder.  States it feels the way it did when she had heart attack. Valium was restarted for pt's anxiety.

## 2015-04-10 NOTE — Progress Notes (Signed)
[facility] [place] NAME: Lynn Butler    MR#:  [id number]  DATE OF BIRTH:  August 26, 1945  SUBJECTIVE:  CHIEF COMPLAINT:   Chief Complaint  Patient presents with  . Altered Mental Status   patient here due to altered mental status from metabolic encephalopathy. Also noted to have a pneumonia. Still complaining of pain all over.  Diarrhea improved and stool for C. difficile was negative. Still complaining of cough which is nonproductive.  REVIEW OF SYSTEMS:    Review of Systems  Constitutional: Negative for fever and chills.  HENT: Negative for congestion and tinnitus.   Eyes: Negative for blurred vision and double vision.  Respiratory: Positive for cough, shortness of breath and wheezing.   Cardiovascular: Negative for chest pain, orthopnea and PND.  Gastrointestinal: Negative for nausea, vomiting, abdominal pain and diarrhea.  Genitourinary: Negative for dysuria and hematuria.  Neurological: Positive for weakness (generalized). Negative for dizziness, sensory change and focal weakness.  All other systems reviewed and are negative.   Nutrition: Dysphagia 2 Tolerating Diet: Yes Tolerating PT: Await evaluation  DRUG ALLERGIES:   Allergies  Allergen Reactions  . Nsaids Nausea And Vomiting and Other (See Comments)    Reaction:  GI distress and burning     VITALS:  Blood pressure 115/49, pulse 73, temperature 98 F (36.7 C), temperature source Oral, resp. rate 30, height 5\' 4"  (1.626 m), weight 53.116 kg (117 lb 1.6 oz), SpO2 95 %.  PHYSICAL EXAMINATION:   Physical Exam  GENERAL:  69 y.o.-year-old patient lying in the bed with no acute distress.  EYES: Pupils equal, round, reactive to light and accommodation. No scleral icterus. Extraocular muscles intact.  HEENT: Head atraumatic, normocephalic. Oropharynx and nasopharynx clear.  NECK:  Supple, no jugular venous distention. No thyroid enlargement, no tenderness.   LUNGS: Diffuse wheezing, rhonchi during a coughing paroxysm. No rales, No use of accessory muscles of respiration.  CARDIOVASCULAR: S1, S2 normal. No murmurs, rubs, or gallops.  ABDOMEN: Soft, nontender, nondistended. Bowel sounds present. No organomegaly or mass.  EXTREMITIES: No cyanosis, clubbing or edema b/l.    NEUROLOGIC: Cranial nerves II through XII are intact. No focal Motor or sensory deficits b/l. Globally weak  PSYCHIATRIC: The patient is alert and oriented x 3. Good affect SKIN: No obvious rash, lesion. Large skin cancer on the forehead with no acute bleeding.   LABORATORY PANEL:   CBC  Recent Labs Lab 04/10/15 0437  WBC 8.0  HGB 8.9*  HCT 29.7*  PLT 227   ------------------------------------------------------------------------------------------------------------------  Chemistries   Recent Labs Lab 04/07/15 1235  04/09/15 0423  04/10/15 0437  NA 145  < > 141  --   --   K 4.8  < > 2.6*  < > 4.3  CL 105  < > 103  --   --   CO2 27  < > 31  --   --   GLUCOSE 131*  < > 84  --   --   BUN 51*  < > 18  --   --   CREATININE 2.11*  < > 0.80  --  0.69  CALCIUM 8.2*  < > 7.7*  --   --   MG  --   --   --   --  1.7  AST 38  --   --   --   --   ALT 24  --   --   --   --  ALKPHOS 109  --   --   --   --   BILITOT 0.9  --   --   --   --   < > = values in this interval not displayed. ------------------------------------------------------------------------------------------------------------------  Cardiac Enzymes  Recent Labs Lab 04/07/15 1235  TROPONINI 1.04*   ------------------------------------------------------------------------------------------------------------------  RADIOLOGY:  No results found.   ASSESSMENT AND PLAN:  69 year old female with past medical history of COPD, ischemic cardiomyopathy, hypertension, history of coronary disease, peripheral asked disease, hyperlipidemia, chronic back pain, CHF, skin cancer who presented to the hospital due  to altered mental status and noted to have sepsis due to pneumonia.  #1 sepsis-this was likely secondary to pneumonia right lower lobe. -Patient had a leukocytosis and was tachycardic with a positive chest x-ray findings suggestive of pneumonia. -Continue IV vancomycin, Levaquin, Zosyn. -Blood, sputum cultures so far negative.  Clinically improving  #2 right lower lobe pneumonia-likely the cause of patient's sepsis. -Continue broad-spectrum IV antibiotics with vancomycin/Zosyn/Levaquin. -Cultures so far negative and clinically patient is improving. Leukocytosis resolved  #3 hypokalemia-improving with supplementation and normalized now. Magnesium level normal.  #4 COPD-no acute exacerbation -Continue Spiriva, Dulera, when necessary DuoNeb's.  #5 chronic pain-continue OxyContin, Dilaudid as needed.  #6 skin cancer-patient has a large lesion on the forehead consistent with skin cancer. -Patient is status post skin grafting in the past. Continue follow-up with dermatology as an outpatient.  Patient was seen by physical therapy and recommended short-term rehabilitation. Patient has been seen by social work and they're doing bed search presently.  All the records are reviewed and case discussed with Care Management/Social Workerr. Management plans discussed with the patient, family and they are in agreement.  CODE STATUS: Full  DVT Prophylaxis: Lovenox  TOTAL TIME TAKING CARE OF THIS PATIENT: 25 minutes.   POSSIBLE D/C IN 2-3 DAYS, DEPENDING ON CLINICAL CONDITION.   Henreitta Leber M.D on 04/10/2015 at 12:16 PM  Between 7am to 6pm - Pager - [phone]  After 6pm go to [email or web address] - password [email or web address] [facility]  [facility]  Office  [phone]  CC: Primary care physician; Juanell Fairly, MD

## 2015-04-11 DIAGNOSIS — A419 Sepsis, unspecified organism: Secondary | ICD-10-CM | POA: Diagnosis not present

## 2015-04-11 DIAGNOSIS — C4490 Unspecified malignant neoplasm of skin, unspecified: Secondary | ICD-10-CM | POA: Diagnosis not present

## 2015-04-11 DIAGNOSIS — Z515 Encounter for palliative care: Secondary | ICD-10-CM | POA: Diagnosis not present

## 2015-04-11 DIAGNOSIS — J189 Pneumonia, unspecified organism: Secondary | ICD-10-CM | POA: Diagnosis not present

## 2015-04-11 DIAGNOSIS — I10 Essential (primary) hypertension: Secondary | ICD-10-CM | POA: Diagnosis not present

## 2015-04-11 LAB — BASIC METABOLIC PANEL
Anion gap: 6 (ref 5–15)
BUN: 16 mg/dL (ref 6–20)
CALCIUM: 8.4 mg/dL — AB (ref 8.9–10.3)
CO2: 29 mmol/L (ref 22–32)
CREATININE: 0.72 mg/dL (ref 0.44–1.00)
Chloride: 108 mmol/L (ref 101–111)
GFR calc non Af Amer: >60 mL/min (ref >60)
Glucose, Bld: 71 mg/dL (ref 65–99)
Potassium: 4.3 mmol/L (ref 3.5–5.1)
SODIUM: 143 mmol/L (ref 135–145)

## 2015-04-11 LAB — CULTURE, RESPIRATORY W GRAM STAIN: Culture: NORMAL

## 2015-04-11 LAB — CULTURE, RESPIRATORY: SPECIAL REQUESTS: NORMAL

## 2015-04-11 MED ORDER — OXYCODONE HCL ER 80 MG PO T12A: 80.0000 mg | EXTENDED_RELEASE_TABLET | Freq: Two times a day (BID) | ORAL | Status: DC

## 2015-04-11 MED ORDER — LEVOFLOXACIN 750 MG PO TABS
750.0000 mg | ORAL_TABLET | Freq: Every day | ORAL | Status: DC
Start: 2015-04-12 — End: 2015-04-11

## 2015-04-11 MED ORDER — PREDNISONE 10 MG PO TABS: ORAL_TABLET | ORAL | Status: DC

## 2015-04-11 MED ORDER — HYDROMORPHONE HCL 4 MG PO TABS: 4.0000 mg | ORAL_TABLET | Freq: Four times a day (QID) | ORAL | Status: DC | PRN

## 2015-04-11 MED ORDER — LEVOFLOXACIN 750 MG PO TABS: 750.0000 mg | ORAL_TABLET | Freq: Every day | ORAL | Status: AC

## 2015-04-11 MED ORDER — LOPERAMIDE HCL 2 MG PO TABS: 2.0000 mg | ORAL_TABLET | Freq: Four times a day (QID) | ORAL | Status: DC | PRN

## 2015-04-11 MED ORDER — MOMETASONE FURO-FORMOTEROL FUM 100-5 MCG/ACT IN AERO: 2.0000 | INHALATION_SPRAY | Freq: Two times a day (BID) | RESPIRATORY_TRACT | Status: AC

## 2015-04-11 MED ORDER — DIAZEPAM 10 MG PO TABS: 10.0000 mg | ORAL_TABLET | Freq: Two times a day (BID) | ORAL | Status: DC | PRN

## 2015-04-11 MED ORDER — TIOTROPIUM BROMIDE MONOHYDRATE 18 MCG IN CAPS: 18.0000 ug | ORAL_CAPSULE | Freq: Every day | RESPIRATORY_TRACT | Status: AC

## 2015-04-11 MED ORDER — LORAZEPAM 1 MG PO TABS: 1.0000 mg | ORAL_TABLET | Freq: Three times a day (TID) | ORAL | Status: DC | PRN

## 2015-04-11 NOTE — Progress Notes (Addendum)
Palliative Medicine Inpatient Consult Follow Up Note   Name: Lynn Butler Date: 04/11/2015 MRN: [id number]  DOB: 1945-12-01  Referring Physician: Henreitta Leber, MD  Palliative Care consult requested for this 69 y.o. female for goals of medical therapy in patient with sepsis.    TODAY'S CONVERSATIONS, EVENTS, AND PLANS: 1.  Pt is now DNR per her wishes stated to me (nurse, Stanton Kidney, was in the room and witnessed pt telling me 'just let me go')  2.  She was a Hospice Pt from 2009 to 2013!  Apparently, then, for a time at least, she was on a Dilaudid drip --and this wasn't even related to her Hospice Diagnosis. She is NOT felt to be eligible for Hospice at this time. She is resilient and does not meet criteria for Hospice as she is not really felt to be likely to pass away within 6 months time.    3.   A Palliative Care consult is also not felt to be appropriate for this pt at this time. She does need constant oversight of her chronic pain regimen.    4.   She really needs to have an appt with Dr Kendell Bane.  Its a bit unclear which office she will go to. Daughter says he has privileges at [facility] but they told her there that he goes to a [place] office sometimes.  I left a message on his office line. Office number is [phone].  This is to follow up her skin graft over the skin defect left when she had a large basal cell excised (possibly by [facility] ??) from her forehead 2-3 weeks ago.  This is not an active skin cancer apparently, and certainly not a fatal one, even if margins were not clear.     ADDENDUM:  PLEASE PUT THE FOLLOWING IN THE DISCHARGE SUMMARY (as per Dr Megan Salon):  Note that the scab on patient's forehead is actually a skin graft with scabbing.  This is normal following skin graft's. She USED TO have a skin cancer there, but it was surgically removed by Dr. Kendell Bane while pt was at [facility] in [place]) around Aug 20th or so.  She is overdue for a  follow up appointment with Dr Amalia Hailey (pt says that there are still staples deep in the scab but this is not confirmed).  His office number is [phone] ---daughter can take pt to appointment but it needs to be arranged soon.       REVIEW OF SYSTEMS:  She says she feels better and she wants to go to dinner tonight with her daughter as soon as she gets to the facility.    CODE STATUS: DNR  --as of now. Pt agrees with DNR.  A golden form for DNR is completed.  I told daughter about this by phone.     PAST MEDICAL HISTORY: Past Medical History  Diagnosis Date  . COPD (chronic obstructive pulmonary disease)   . Hypertension   . Ischemic cardiomyopathy     a.   . Coronary artery disease     a. s/p multiple stenting in 2005  . History of ventricular tachycardia   . PVD (peripheral vascular disease)   . PAD (peripheral artery disease)   . HLD (hyperlipidemia)   . Chronic back pain   . MI, old   . MRSA (methicillin resistant staph aureus) culture positive     left foot wound  . CHF (congestive heart failure)   . Pneumonia  PAST SURGICAL HISTORY:  Past Surgical History  Procedure Laterality Date  . Insert / replace / remove pacemaker    . Vascular bypass surgery Bilateral   . Cardiac catheterization N/A 01/14/2015    Procedure: Left Heart Cath;  Surgeon: Wellington Hampshire, MD;  Location: [facility];  Service: Cardiovascular;  Laterality: N/A;  . Cardiac defibrillator placement    . Appendectomy    . Cesarean section    . Abdominal surgery      Vital Signs: BP 131/67 mmHg  Pulse 72  Temp(Src) 97.6 F (36.4 C) (Oral)  Resp 18  Ht 5\' 4"  (1.626 m)  Wt 53.116 kg (117 lb 1.6 oz)  BMI 20.09 kg/m2  SpO2 100% Filed Weights   04/07/15 1210 04/07/15 1600 04/08/15 2006  Weight: 51.7 kg (113 lb 15.7 oz) 52.1 kg (114 lb 13.8 oz) 53.116 kg (117 lb 1.6 oz)    Estimated body mass index is 20.09 kg/(m^2) as calculated from the following:   Height as of this encounter: 5'  4" (1.626 m).   Weight as of this encounter: 53.116 kg (117 lb 1.6 oz).  PHYSICAL EXAM: Awake and alert and oriented fully EOMI Scab on forehead is unchanged (over site where skin graft was placed after basal cell cancer was excised by surgeon at [facility] 2-3 wks ago) OP clear Neck w/o JVD Hrt rrr no mgr Lungs clear Abd soft and NT Ext no cyanosis or mottling   LABS: CBC:    Component Value Date/Time   WBC 8.0 04/10/2015 0437   WBC 6.9 11/22/2014 0748   HGB 8.9* 04/10/2015 0437   HGB 10.3* 11/22/2014 0748   HCT 29.7* 04/10/2015 0437   HCT 32.9* 11/22/2014 0748   PLT 227 04/10/2015 0437   PLT 189 11/26/2014 0428   MCV 69.6* 04/10/2015 0437   MCV 72* 11/22/2014 0748   NEUTROABS 23.1* 04/07/2015 1235   NEUTROABS 4.6 11/22/2014 0748   LYMPHSABS 1.9 04/07/2015 1235   LYMPHSABS 1.4 11/22/2014 0748   MONOABS 2.5* 04/07/2015 1235   MONOABS 0.8 11/22/2014 0748   EOSABS 0.0 04/07/2015 1235   EOSABS 0.0 11/22/2014 0748   BASOSABS 0.0 04/07/2015 1235   BASOSABS 0.0 11/22/2014 0748   Comprehensive Metabolic Panel:    Component Value Date/Time   NA 143 04/11/2015 0415   NA 139 11/25/2014 0518   K 4.3 04/11/2015 0415   K 3.8 11/25/2014 0518   CL 108 04/11/2015 0415   CL 103 11/25/2014 0518   CO2 29 04/11/2015 0415   CO2 31 11/25/2014 0518   BUN 16 04/11/2015 0415   BUN 25* 11/25/2014 0518   CREATININE 0.72 04/11/2015 0415   CREATININE 0.66 11/25/2014 0518   GLUCOSE 71 04/11/2015 0415   GLUCOSE 83 11/25/2014 0518   CALCIUM 8.4* 04/11/2015 0415   CALCIUM 8.0* 11/25/2014 0518   AST 38 04/07/2015 1235   AST 23 11/15/2014 1819   ALT 24 04/07/2015 1235   ALT 14 11/15/2014 1819   ALKPHOS 109 04/07/2015 1235   ALKPHOS 109 11/15/2014 1819   BILITOT 0.9 04/07/2015 1235   BILITOT 0.6 11/15/2014 1819   PROT 6.5 04/07/2015 1235   PROT 7.1 11/15/2014 1819   ALBUMIN 3.0* 04/07/2015 1235   ALBUMIN 3.3* 11/15/2014 1819    IMPRESSION: 1. Sepsis --improved 2. Right Lower  Lobe Pneumonia (HCAP) ---with recent hospital adm for sepsis due to HCAP in July 2016 --on Zosyn, Vanc, and Levaquin  --also hospitalized for ? Pneumonia at [facility] and  this was followed by a stay at [facility] (she just got out 1-2 wks ago).  3. Essential HTN 4. Ischemic Cardiomyopathy ---with h/o placement of defibrillator / pacer 5. Combined chronic systolic and diastolic congestive heart failure 6. Skin cancer  ---Probable Basal Cell of forehead --excised by a Dr. Kendell Bane, surgeon, while she was a pt at [facility] 2-3 wks ago (per pts history and per daughter's history as given to me today when I called daughter).  Pt has a SKIN GRAFT over location where Basal Cell was excised (possibly by Moh's ?) .  She is overdue to follow up with Dr. Kendell Bane b/c she has been here when she would have seen him in follow up.  His # is [phone].  I left a message for that office to call daughter.  7. CAD with multiple stents and h/o MI 8. PAD 9. Dyslipidemia 10. Chronic back pain 11. Tobacco smoker (current) 12. COPD 85. Insomnia 14. Severe Malnutrition 15. Social Crisis ---There were concerns about how pt was found by EMS ---Pt is not able to go live independently yet.  She must have a rehab stay.      REFERRALS TO BE ORDERED:  PT MUST SEE DR Delfino Lovett EVANS TO FOLLOW UP SKIN GRAFT PLACE 2-3 WKS AGO AT [facility] (I CONFIRMED THIS IS ACCURATE HISTORY PER CALL TO DAUGHTER).  DR. Amalia Hailey OFFICE NUMBER IS [phone].  (I left a message for them to call pts daughter back about a follow up appt --no one was in the office to take my call at 1:12 pm today).   More than 50% of the visit was spent in counseling/coordination of care: YES  Time Spent: 35 min

## 2015-04-11 NOTE — Progress Notes (Signed)
Lynn Butler liaison met with patient and daughter Lynn Butler at bedside. Patient and daughter are agreeable to going to [facility] today. Clinical Social Worker (CSW) will continue to follow and assist as needed.   Blima Rich, [place] [phone]

## 2015-04-11 NOTE — Discharge Summary (Signed)
[facility] NAME: Lynn Butler    MR#:  [id number]  DATE OF BIRTH:  1945/12/17  DATE OF ADMISSION:  04/07/2015 ADMITTING PHYSICIAN: Theodoro Grist, MD  DATE OF DISCHARGE: 04/11/2015  PRIMARY CARE PHYSICIAN: Juanell Fairly, MD    ADMISSION DIAGNOSIS:  Skin cancer [C44.90] HCAP (healthcare-associated pneumonia) [J18.9] Sepsis, due to unspecified organism [A41.9]  DISCHARGE DIAGNOSIS:  Active Problems:   Sepsis   Right lower lobe pneumonia   Acute on chronic respiratory failure with hypoxia   Acute renal failure   Leukocytosis   Metabolic encephalopathy   SECONDARY DIAGNOSIS:   Past Medical History  Diagnosis Date  . COPD (chronic obstructive pulmonary disease)   . Hypertension   . Ischemic cardiomyopathy     a.   . Coronary artery disease     a. s/p multiple stenting in 2005  . History of ventricular tachycardia   . PVD (peripheral vascular disease)   . PAD (peripheral artery disease)   . HLD (hyperlipidemia)   . Chronic back pain   . MI, old   . MRSA (methicillin resistant staph aureus) culture positive     left foot wound  . CHF (congestive heart failure)   . Pneumonia     HOSPITAL COURSE:   69 year old female with past medical history of COPD, ischemic cardiomyopathy, hypertension, history of coronary disease, peripheral asked disease, hyperlipidemia, chronic back pain, CHF, skin cancer who presented to the hospital due to altered mental status and noted to have sepsis due to pneumonia.  #1 sepsis-this was likely secondary to pneumonia right lower lobe. -Patient had a leukocytosis and was tachycardic with a positive chest x-ray findings suggestive of pneumonia. -Patient was treated with broad-spectrum IV antibiotics with vancomycin and Zosyn and Levaquin and has clinically improved since admission. She has been afebrile and hemodynamically stable. Her blood and sputum cultures have been negative since  admission. -I presently patient is being discharged on oral Levaquin for another 7 days.  #2 right lower lobe pneumonia-likely the cause of patient's sepsis. -Patient has clinically improved with broad-spectrum IV antibiotics with vancomycin, Zosyn, Levaquin. Her sputum and blood cultures remained negative and she has clinically improved. -Patient presently is therefore being discharged on oral Levaquin for an additional 7 days.  #3 hypokalemia-improved and resolved with supplementation. Magnesium level was also normal now.  #4 COPD-no acute exacerbation -Patient had some mild bronchospasm due to pneumonia. Patient will therefore finish a prednisone taper. -Continue Spiriva, Dulera, when necessary DuoNeb's.  #5 chronic pain-continue OxyContin, Dilaudid as needed.  #6 history of skin cancer status post grafting-patient has a large lesion on the forehead.  Please see note below Note that the scab on patient's forehead is actually a skin graft with scabbing. This is normal following skin graft's. She USED TO have a skin cancer there, but it was surgically removed by Dr. Kendell Bane while pt was at [facility] in [place]) around Aug 20th or so. She is overdue for a follow up appointment with Dr Amalia Hailey (pt says that there are still staples deep in the scab but this is not confirmed). His office number is [phone] ---daughter can take pt to appointment but it needs to be arranged soon.  #7 hyperlipidemia-continue atorvastatin.  #8 diarrhea-patient had developed some diarrhea over the past 48 hours. Since patient had been on antibiotics stool for C. difficile was checked which was negative.  -patient is being discharged on some oral Imodium as needed.  DISCHARGE CONDITIONS:   Stable  CONSULTS OBTAINED:  Treatment Team:  Flora Lipps, MD  DRUG ALLERGIES:   Allergies  Allergen Reactions  . Nsaids Nausea And Vomiting and Other (See Comments)    Reaction:  GI distress  and burning     DISCHARGE MEDICATIONS:   Current Discharge Medication List    START taking these medications   Details  levofloxacin (LEVAQUIN) 750 MG tablet Take 1 tablet (750 mg total) by mouth daily.    loperamide (IMODIUM A-D) 2 MG tablet Take 1 tablet (2 mg total) by mouth 4 (four) times daily as needed for diarrhea or loose stools. Qty: 30 tablet, Refills: 0    mometasone-formoterol (DULERA) 100-5 MCG/ACT AERO Inhale 2 puffs into the lungs 2 (two) times daily.    predniSONE (DELTASONE) 10 MG tablet Label  & dispense according to the schedule below. 5 Pills PO for 1 day then, 4 Pills PO for 1 day, 3 Pills PO for 1 day, 2 Pills PO for 1 day, 1 Pill PO for 1 days then STOP.    tiotropium (SPIRIVA) 18 MCG inhalation capsule Place 1 capsule (18 mcg total) into inhaler and inhale daily. Qty: 30 capsule, Refills: 12      CONTINUE these medications which have CHANGED   Details  !! diazepam (VALIUM) 10 MG tablet Take 1 tablet (10 mg total) by mouth every 12 (twelve) hours as needed for anxiety. Qty: 30 tablet, Refills: 0    HYDROmorphone (DILAUDID) 4 MG tablet Take 1 tablet (4 mg total) by mouth every 6 (six) hours as needed for severe pain. Qty: 15 tablet, Refills: 0    LORazepam (ATIVAN) 1 MG tablet Take 1 tablet (1 mg total) by mouth every 8 (eight) hours as needed for anxiety. Qty: 15 tablet, Refills: 0    OxyCODONE (OXYCONTIN) 80 mg T12A 12 hr tablet Take 1 tablet (80 mg total) by mouth every 12 (twelve) hours. Qty: 14 tablet, Refills: 0     !! - Potential duplicate medications found. Please discuss with provider.    CONTINUE these medications which have NOT CHANGED   Details  !! diazepam (VALIUM) 10 MG tablet Take 1 tablet (10 mg total) by mouth every 12 (twelve) hours as needed for anxiety. Qty: 30 tablet, Refills: 0    diphenoxylate-atropine (LOMOTIL) 2.5-0.025 MG per tablet Take 1 tablet by mouth every 6 (six) hours as needed for diarrhea or loose stools.     zolpidem (AMBIEN) 10 MG tablet Take 1 tablet (10 mg total) by mouth at bedtime as needed for sleep. Qty: 10 tablet, Refills: 0    albuterol (PROAIR HFA) 108 (90 BASE) MCG/ACT inhaler Inhale 1-2 puffs into the lungs every 4 (four) hours as needed for wheezing or shortness of breath.    atorvastatin (LIPITOR) 40 MG tablet Take 1 tablet (40 mg total) by mouth daily at 6 PM. Qty: 30 tablet, Refills: 0    benzonatate (TESSALON) 200 MG capsule Take 1 capsule (200 mg total) by mouth 3 (three) times daily as needed for cough. Qty: 20 capsule, Refills: 0    carvedilol (COREG) 3.125 MG tablet Take 1 tablet (3.125 mg total) by mouth 2 (two) times daily with a meal. Qty: 60 tablet, Refills: 2    feeding supplement, ENSURE ENLIVE, (ENSURE ENLIVE) LIQD Take 237 mLs by mouth 3 (three) times daily with meals. Qty: 237 mL, Refills: 12    guaiFENesin (MUCINEX) 600 MG 12 hr tablet Take 1 tablet (600 mg total) by mouth 2 (  two) times daily. Qty: 20 tablet, Refills: 0    ipratropium-albuterol (DUONEB) 0.5-2.5 (3) MG/3ML SOLN Take 3 mLs by nebulization every 6 (six) hours as needed. Qty: 360 mL, Refills: 0    isosorbide mononitrate (IMDUR) 30 MG 24 hr tablet Take 1 tablet (30 mg total) by mouth daily. Qty: 30 tablet, Refills: 0     !! - Potential duplicate medications found. Please discuss with provider.    STOP taking these medications     acetylcysteine (MUCOMYST) 20 % nebulizer solution      furosemide (LASIX) 20 MG tablet      piperacillin-tazobactam (ZOSYN) 3.375 GM/50ML IVPB      vancomycin 500 mg in sodium chloride 0.9 % 100 mL          DISCHARGE INSTRUCTIONS:   DIET:  Dysphagia to thin liquids  DISCHARGE CONDITION:  Stable  ACTIVITY:  Activity as tolerated  OXYGEN:  Home Oxygen: No.   Oxygen Delivery: room air  DISCHARGE LOCATION:  skilled nursing facility  If you experience worsening of your admission symptoms, develop shortness of breath, life threatening emergency,  suicidal or homicidal thoughts you must seek medical attention immediately by calling 911 or calling your MD immediately  if symptoms less severe.  You Must read complete instructions/literature along with all the possible adverse reactions/side effects for all the Medicines you take and that have been prescribed to you. Take any new Medicines after you have completely understood and accpet all the possible adverse reactions/side effects.   Please note  You were cared for by a hospitalist during your hospital stay. If you have any questions about your discharge medications or the care you received while you were in the hospital after you are discharged, you can call the unit and asked to speak with the hospitalist on call if the hospitalist that took care of you is not available. Once you are discharged, your primary care physician will handle any further medical issues. Please note that NO REFILLS for any discharge medications will be authorized once you are discharged, as it is imperative that you return to your primary care physician (or establish a relationship with a primary care physician if you do not have one) for your aftercare needs so that they can reassess your need for medications and monitor your lab values.     Today   Clinically feels better no acute complaints presently.  VITAL SIGNS:  Blood pressure 106/48, pulse 79, temperature 98.1 F (36.7 C), temperature source Oral, resp. rate 18, height 5\' 4"  (1.626 m), weight 53.116 kg (117 lb 1.6 oz), SpO2 94 %.  I/O:   Intake/Output Summary (Last 24 hours) at 04/11/15 1350 Last data filed at 04/11/15 0200  Gross per 24 hour  Intake    632 ml  Output      0 ml  Net    632 ml    PHYSICAL EXAMINATION:   GENERAL: 69 y.o.-year-old patient lying in the bed with no acute distress.  EYES: Pupils equal, round, reactive to light and accommodation. No scleral icterus. Extraocular muscles intact.  HEENT: Head atraumatic,  normocephalic. Oropharynx and nasopharynx clear.  NECK: Supple, no jugular venous distention. No thyroid enlargement, no tenderness.  LUNGS: Minimal end expiratory wheezing., No rhonchi, No rales, No use of accessory muscles of respiration.  CARDIOVASCULAR: S1, S2 normal. No murmurs, rubs, or gallops.  ABDOMEN: Soft, nontender, nondistended. Bowel sounds present. No organomegaly or mass.  EXTREMITIES: No cyanosis, clubbing or edema b/l.  NEUROLOGIC: Cranial nerves  II through XII are intact. No focal Motor or sensory deficits b/l. Globally weak  PSYCHIATRIC: The patient is alert and oriented x 3. Good affect SKIN: No obvious rash, lesion. Large scabbing area on the forehead which is a scabbed over skin graft from previous skin cancer surgery  DATA REVIEW:   CBC  Recent Labs Lab 04/10/15 0437  WBC 8.0  HGB 8.9*  HCT 29.7*  PLT 227    Chemistries   Recent Labs Lab 04/07/15 1235  04/10/15 0437 04/11/15 0415  NA 145  < >  --  143  K 4.8  < > 4.3 4.3  CL 105  < >  --  108  CO2 27  < >  --  29  GLUCOSE 131*  < >  --  71  BUN 51*  < >  --  16  CREATININE 2.11*  < > 0.69 0.72  CALCIUM 8.2*  < >  --  8.4*  MG  --   --  1.7  --   AST 38  --   --   --   ALT 24  --   --   --   ALKPHOS 109  --   --   --   BILITOT 0.9  --   --   --   < > = values in this interval not displayed.  Cardiac Enzymes  Recent Labs Lab 04/07/15 1235  TROPONINI 1.04*    Microbiology Results  Results for orders placed or performed during the hospital encounter of 04/07/15  Blood Culture (routine x 2)     Status: None (Preliminary result)   Collection Time: 04/07/15 12:42 PM  Result Value Ref Range Status   Specimen Description BLOOD RIGHT WRIST  Final   Special Requests NONE  Final   Culture NO GROWTH 4 DAYS  Final   Report Status PENDING  Incomplete  Blood Culture (routine x 2)     Status: None (Preliminary result)   Collection Time: 04/07/15 12:42 PM  Result Value Ref Range Status    Specimen Description BLOOD LEFT HAND  Final   Culture NO GROWTH 4 DAYS  Final   Report Status PENDING  Incomplete  Urine culture     Status: None   Collection Time: 04/07/15  1:18 PM  Result Value Ref Range Status   Specimen Description URINE, CLEAN CATCH  Final   Special Requests NONE  Final   Culture MULTIPLE SPECIES PRESENT, SUGGEST RECOLLECTION  Final   Report Status 04/09/2015 FINAL  Final  Surgical pcr screen     Status: Abnormal   Collection Time: 04/07/15  4:00 PM  Result Value Ref Range Status   MRSA, PCR POSITIVE (A) NEGATIVE Final   Staphylococcus aureus POSITIVE (A) NEGATIVE Final    Comment:        The Xpert SA Assay (FDA approved for NASAL specimens in patients over 28 years of age), is one component of a comprehensive surveillance program.  Test performance has been validated by [facility] for patients greater than or equal to 13 year old. It is not intended to diagnose infection nor to guide or monitor treatment. CRITICAL RESULT CALLED TO, READ BACK BY AND VERIFIED WITH: CHARLIE SLEETWOOD AT 1809 ON 04/07/15 BY MLZ    Culture, sputum-assessment     Status: None   Collection Time: 04/08/15  6:00 AM  Result Value Ref Range Status   Specimen Description SPUTUM  Final   Special Requests Normal  Final  Sputum evaluation THIS SPECIMEN IS ACCEPTABLE FOR SPUTUM CULTURE  Final   Report Status 04/09/2015 FINAL  Final  Culture, respiratory (NON-Expectorated)     Status: None (Preliminary result)   Collection Time: 04/08/15  6:00 AM  Result Value Ref Range Status   Specimen Description SPUTUM  Final   Special Requests Normal Reflexed from [id number]  Final   Gram Stain PENDING  Incomplete   Culture APPEARS TO BE NORMAL FLORA  Final   Report Status PENDING  Incomplete  C difficile quick scan w PCR reflex     Status: None   Collection Time: 04/09/15 10:04 AM  Result Value Ref Range Status   C Diff antigen NEGATIVE NEGATIVE Final   C Diff toxin NEGATIVE NEGATIVE Final    C Diff interpretation Negative for C. difficile  Final    RADIOLOGY:  No results found.    Management plans discussed with the patient, family and they are in agreement.  CODE STATUS:     Code Status Orders        Start     Ordered   04/11/15 1251  Do not attempt resuscitation (DNR)   Continuous    Question Answer Comment  In the event of cardiac or respiratory ARREST Do not call a "code blue"   In the event of cardiac or respiratory ARREST Do not perform Intubation, CPR, defibrillation or ACLS   In the event of cardiac or respiratory ARREST Use medication by any route, position, wound care, and other measures to relive pain and suffering. May use oxygen, suction and manual treatment of airway obstruction as needed for comfort.      04/11/15 1250      TOTAL TIME TAKING CARE OF THIS PATIENT: 45 minutes.    Henreitta Leber M.D on 04/11/2015 at 1:50 PM  Between 7am to 6pm - Pager - [phone]  After 6pm go to [email or web address] - password [email or web address] [facility]  [facility]  Office  [phone]  CC: Primary care physician; Juanell Fairly, MD

## 2015-04-11 NOTE — Care Management Important Message (Signed)
Important Message  Patient Details  Name: Aleighna Wojtas MRN: [id number] Date of Birth: 27-Oct-1945   Medicare Important Message Given:  Yes-second notification given    Shelbie Ammons, RN 04/11/2015, 9:55 AM

## 2015-04-11 NOTE — Progress Notes (Signed)
[facility] APS report was made.   Blima Rich, [place] [phone]

## 2015-04-11 NOTE — Progress Notes (Signed)
Patient is medically stable for D/C to [facility] today. Per [facility] liaison patient is going to room 141. RN will call report to Yaakov Guthrie at [phone] and arrange EMS for transport. Clinical Education officer, museum (CSW) prepared D/C packet and sent D/C summary to [facility] via [facility]finder. Patient's daughter [name] is aware of D/C. CSW attempted to make an APS report however the line was busy. CSW will continue to call APS and make a report. Please reconsult if future social work needs arise. CSW signing off.   Blima Rich, LCSWA [phone]

## 2015-04-11 NOTE — Clinical Social Work Placement (Signed)
   CLINICAL SOCIAL WORK PLACEMENT  NOTE  Date:  04/11/2015  Patient Details  Name: Lynn Butler MRN: [id number] Date of Birth: Jun 20, 1946  Clinical Social Work is seeking post-discharge placement for this patient at the [place] level of care (*CSW will initial, date and re-position this form in  chart as items are completed):  Yes   Patient/family provided with [facility] Work Department's list of facilities offering this level of care within the geographic area requested by the patient (or if unable, by the patient's family).  Yes   Patient/family informed of their freedom to choose among providers that offer the needed level of care, that participate in Medicare, Medicaid or managed care program needed by the patient, have an available bed and are willing to accept the patient.  Yes   Patient/family informed of [facility]'s ownership interest in [facility] and [facility], as well as of the fact that they are under no obligation to receive care at these facilities.  PASRR submitted to EDS on       PASRR number received on       Existing PASRR number confirmed on 04/09/15     FL2 transmitted to all facilities in geographic area requested by pt/family on 04/10/15     FL2 transmitted to all facilities within larger geographic area on       Patient informed that his/her managed care company has contracts with or will negotiate with certain facilities, including the following:        Yes   Patient/family informed of bed offers received.  Patient chooses bed at  ([facility] )     Physician recommends and patient chooses bed at      Patient to be transferred to  ([facility] ) on 04/11/15.  Patient to be transferred to facility by  [facility] )     Patient family notified on 04/11/15 of transfer.  Name of family member notified:   (Daughter [name] aware of D/C. )     PHYSICIAN       Additional Comment:     _______________________________________________ Loralyn Freshwater, LCSW 04/11/2015, 2:21 PM

## 2015-04-11 NOTE — Plan of Care (Signed)
Pt being d/ced to [facility] - called report to [facility].  Pt going to room 141. Code status changed to DNR today.  Alerted RN to Medical Hx, Skin Issues, orientation (A&O); mobility status - 1 person asst to [facility]; chronic pain issues.  Pt Cdiff neg but MRSA by PCR +.  Also alerted RN to fact we're trying to make a report to APS for condition in which EMS found pt.  Pt will leave via EMS.

## 2015-04-11 NOTE — Progress Notes (Signed)
ANTIBIOTIC CONSULT NOTE - FOLLOW UP   Pharmacy Consult for Levaquin/Zosyn/Vancomycin Indication: sepsis  Allergies  Allergen Reactions  . Nsaids Nausea And Vomiting and Other (See Comments)    Reaction:  GI distress and burning     Patient Measurements: Height: 5\' 4"  (162.6 cm) Weight: 117 lb 1.6 oz (53.116 kg) IBW/kg (Calculated) : 54.7 Adjusted Body Weight: 51.7 kg  Vital Signs: Temp: 97.6 F (36.4 C) (09/12 0507) Temp Source: Oral (09/12 0507) BP: 131/67 mmHg (09/12 0507) Pulse Rate: 72 (09/12 0517) Labs:  Recent Labs  04/09/15 0423 04/10/15 0437 04/11/15 0415  WBC 9.0 8.0  --   HGB 9.1* 8.9*  --   PLT 178 227  --   CREATININE 0.80 0.69 0.72   Estimated Creatinine Clearance: 56.4 mL/min (by C-G formula based on Cr of 0.72). No results for input(s): VANCOTROUGH, VANCOPEAK, VANCORANDOM, GENTTROUGH, GENTPEAK, GENTRANDOM, TOBRATROUGH, TOBRAPEAK, TOBRARND, AMIKACINPEAK, AMIKACINTROU, AMIKACIN in the last 72 hours.   Estimated Creatinine Clearance: 56.4 mL/min (by C-G formula based on Cr of 0.72).   Microbiology: Recent Results (from the past 720 hour(s))  Blood Culture (routine x 2)     Status: None (Preliminary result)   Collection Time: 04/07/15 12:42 PM  Result Value Ref Range Status   Specimen Description BLOOD RIGHT WRIST  Final   Special Requests NONE  Final   Culture NO GROWTH 4 DAYS  Final   Report Status PENDING  Incomplete  Blood Culture (routine x 2)     Status: None (Preliminary result)   Collection Time: 04/07/15 12:42 PM  Result Value Ref Range Status   Specimen Description BLOOD LEFT HAND  Final   Culture NO GROWTH 4 DAYS  Final   Report Status PENDING  Incomplete  Urine culture     Status: None   Collection Time: 04/07/15  1:18 PM  Result Value Ref Range Status   Specimen Description URINE, CLEAN CATCH  Final   Special Requests NONE  Final   Culture MULTIPLE SPECIES PRESENT, SUGGEST RECOLLECTION  Final   Report Status 04/09/2015 FINAL  Final   Surgical pcr screen     Status: Abnormal   Collection Time: 04/07/15  4:00 PM  Result Value Ref Range Status   MRSA, PCR POSITIVE (A) NEGATIVE Final   Staphylococcus aureus POSITIVE (A) NEGATIVE Final    Comment:        The Xpert SA Assay (FDA approved for NASAL specimens in patients over 44 years of age), is one component of a comprehensive surveillance program.  Test performance has been validated by [facility] for patients greater than or equal to 49 year old. It is not intended to diagnose infection nor to guide or monitor treatment. CRITICAL RESULT CALLED TO, READ BACK BY AND VERIFIED WITH: CHARLIE SLEETWOOD AT 1809 ON 04/07/15 BY MLZ    Culture, sputum-assessment     Status: None   Collection Time: 04/08/15  6:00 AM  Result Value Ref Range Status   Specimen Description SPUTUM  Final   Special Requests Normal  Final   Sputum evaluation THIS SPECIMEN IS ACCEPTABLE FOR SPUTUM CULTURE  Final   Report Status 04/09/2015 FINAL  Final  Culture, respiratory (NON-Expectorated)     Status: None (Preliminary result)   Collection Time: 04/08/15  6:00 AM  Result Value Ref Range Status   Specimen Description SPUTUM  Final   Special Requests Normal Reflexed from [id number]  Final   Gram Stain PENDING  Incomplete   Culture APPEARS TO BE NORMAL FLORA  Final   Report Status PENDING  Incomplete  C difficile quick scan w PCR reflex     Status: None   Collection Time: 04/09/15 10:04 AM  Result Value Ref Range Status   C Diff antigen NEGATIVE NEGATIVE Final   C Diff toxin NEGATIVE NEGATIVE Final   C Diff interpretation Negative for C. difficile  Final   Assessment: 69 y/o F admitted with sepsis and RLL PNA ordered empiric abx. On day 5 of vancomycin, zosyn, and levaquin. Cultures with no growth.   Updated PK based on Scr from 9/10: Kel (hr-1): 0.051 Half-life (hrs): 13.59 Vd (liters): 37.17 (factor used: 0.7 L/kg)  Goal of Therapy:  Vancomycin trough level 15-20 mcg/ml  Plan:  Will  continue current orders for vancomycin 1gm IV q18H. Trough ordered prior to 4th dose, which should be at steady state.  Continue zosyn 4.5gm IV Q8H EI and levaquin 750mg  PO Q24H.  Pharmacy to follow per consult.  Rexene Edison, PharmD Clinical Pharmacist 04/11/2015 1:12 PM

## 2015-04-11 NOTE — Plan of Care (Signed)
Problem: COPD GOLD Progrssion Goal: Other COPD GOLD Goal(s) Outcome: Progressing Able to wean to room air- pt is not progressing, has been on oxygen at home and will not be able to be weaned as in patient. Oxygen 100% on 2.5 Liters via [place]. Able to perform ADL's- pt is weak with unsteady gait, 1 assist up to [facility]. Pt is unable at this time to care for self, social work consult ordered.   COPD GOLD action plan- patient is admitted under COPD GOLD and per report 2A RN will be here today get pt enrolled. Discharge appointments- continue to monitor that patient has d/c f/u appointments and indicate how she will transport to PCP appointment.   A&O patient, only confused to month. Isolation precautions for MRSA. C/o right shoulder pain improved with PRN pain medications. C/o cough, improved with PRN cough medication. 2 q hours turns with mouth care while awake.

## 2015-04-11 NOTE — Progress Notes (Addendum)
Clinical Social Worker (CSW) met with patient and her daughter Lynn Butler [phone] was at bedside. CSW presented bed offers. Patient has 2 offers from [facility] and [facility]. Patient refused [facility] and reported that she had a horrible experince there. Patient stated that she would consider going to [facility]. Daughter and pateint requested to speak to [place]. Per Broadus John he will meet with patient and daughter today. Daughter reported that the paramedic that picked patient up from her house in [place] lied about the condition she was in. EMS reported that patient had ants in her socks and feces and urine on her. Patient and daughter denied that ants were in her socks. Daughter reported that there are not ants in her house. Daughter also stated that patient accidentally used the bathroom on herself right before EMS arrived. CSW attempted to make [facility]PS reported however the line was busy. CSW will attempt to call APS at a later time. CSW will continue to follow and assist as needed.   Lynn Butler, [place] [phone]

## 2015-04-12 LAB — CULTURE, BLOOD (ROUTINE X 2)
Culture: NO GROWTH
Culture: NO GROWTH

## 2015-05-21 ENCOUNTER — Emergency Department: Payer: Medicare Other

## 2015-05-21 ENCOUNTER — Encounter: Payer: Self-pay | Admitting: Emergency Medicine

## 2015-05-21 ENCOUNTER — Observation Stay
Admission: EM | Admit: 2015-05-21 | Discharge: 2015-05-24 | Disposition: A | Discharge status: Home / Self Care | Payer: Medicare Other | Attending: Internal Medicine | Admitting: Internal Medicine

## 2015-05-21 DIAGNOSIS — I252 Old myocardial infarction: Secondary | ICD-10-CM | POA: Diagnosis not present

## 2015-05-21 DIAGNOSIS — Z955 Presence of coronary angioplasty implant and graft: Secondary | ICD-10-CM | POA: Insufficient documentation

## 2015-05-21 DIAGNOSIS — R05 Cough: Secondary | ICD-10-CM | POA: Insufficient documentation

## 2015-05-21 DIAGNOSIS — Z9889 Other specified postprocedural states: Secondary | ICD-10-CM | POA: Diagnosis not present

## 2015-05-21 DIAGNOSIS — R079 Chest pain, unspecified: Secondary | ICD-10-CM | POA: Diagnosis not present

## 2015-05-21 DIAGNOSIS — E782 Mixed hyperlipidemia: Secondary | ICD-10-CM | POA: Insufficient documentation

## 2015-05-21 DIAGNOSIS — R0602 Shortness of breath: Secondary | ICD-10-CM | POA: Diagnosis not present

## 2015-05-21 DIAGNOSIS — J189 Pneumonia, unspecified organism: Secondary | ICD-10-CM

## 2015-05-21 DIAGNOSIS — J45909 Unspecified asthma, uncomplicated: Secondary | ICD-10-CM | POA: Insufficient documentation

## 2015-05-21 DIAGNOSIS — J449 Chronic obstructive pulmonary disease, unspecified: Secondary | ICD-10-CM | POA: Diagnosis not present

## 2015-05-21 DIAGNOSIS — Z7951 Long term (current) use of inhaled steroids: Secondary | ICD-10-CM | POA: Diagnosis not present

## 2015-05-21 DIAGNOSIS — Z9049 Acquired absence of other specified parts of digestive tract: Secondary | ICD-10-CM | POA: Diagnosis not present

## 2015-05-21 DIAGNOSIS — F17210 Nicotine dependence, cigarettes, uncomplicated: Secondary | ICD-10-CM | POA: Diagnosis not present

## 2015-05-21 DIAGNOSIS — Z886 Allergy status to analgesic agent status: Secondary | ICD-10-CM | POA: Diagnosis not present

## 2015-05-21 DIAGNOSIS — I2510 Atherosclerotic heart disease of native coronary artery without angina pectoris: Secondary | ICD-10-CM | POA: Diagnosis not present

## 2015-05-21 DIAGNOSIS — G894 Chronic pain syndrome: Secondary | ICD-10-CM | POA: Insufficient documentation

## 2015-05-21 DIAGNOSIS — Z950 Presence of cardiac pacemaker: Secondary | ICD-10-CM | POA: Diagnosis not present

## 2015-05-21 DIAGNOSIS — Z9981 Dependence on supplemental oxygen: Secondary | ICD-10-CM | POA: Diagnosis not present

## 2015-05-21 DIAGNOSIS — Z8614 Personal history of Methicillin resistant Staphylococcus aureus infection: Secondary | ICD-10-CM | POA: Diagnosis not present

## 2015-05-21 DIAGNOSIS — I517 Cardiomegaly: Secondary | ICD-10-CM | POA: Insufficient documentation

## 2015-05-21 DIAGNOSIS — M549 Dorsalgia, unspecified: Secondary | ICD-10-CM | POA: Insufficient documentation

## 2015-05-21 DIAGNOSIS — R778 Other specified abnormalities of plasma proteins: Secondary | ICD-10-CM

## 2015-05-21 DIAGNOSIS — Z8701 Personal history of pneumonia (recurrent): Secondary | ICD-10-CM | POA: Diagnosis not present

## 2015-05-21 DIAGNOSIS — Z79891 Long term (current) use of opiate analgesic: Secondary | ICD-10-CM | POA: Diagnosis not present

## 2015-05-21 DIAGNOSIS — I110 Hypertensive heart disease with heart failure: Secondary | ICD-10-CM | POA: Insufficient documentation

## 2015-05-21 DIAGNOSIS — Z95810 Presence of automatic (implantable) cardiac defibrillator: Secondary | ICD-10-CM | POA: Diagnosis not present

## 2015-05-21 DIAGNOSIS — Z8249 Family history of ischemic heart disease and other diseases of the circulatory system: Secondary | ICD-10-CM | POA: Diagnosis not present

## 2015-05-21 DIAGNOSIS — Z7952 Long term (current) use of systemic steroids: Secondary | ICD-10-CM | POA: Insufficient documentation

## 2015-05-21 DIAGNOSIS — R590 Localized enlarged lymph nodes: Secondary | ICD-10-CM | POA: Insufficient documentation

## 2015-05-21 DIAGNOSIS — R918 Other nonspecific abnormal finding of lung field: Secondary | ICD-10-CM | POA: Diagnosis not present

## 2015-05-21 DIAGNOSIS — I255 Ischemic cardiomyopathy: Secondary | ICD-10-CM | POA: Insufficient documentation

## 2015-05-21 DIAGNOSIS — Z888 Allergy status to other drugs, medicaments and biological substances status: Secondary | ICD-10-CM | POA: Diagnosis not present

## 2015-05-21 DIAGNOSIS — Z79899 Other long term (current) drug therapy: Secondary | ICD-10-CM | POA: Insufficient documentation

## 2015-05-21 DIAGNOSIS — R7989 Other specified abnormal findings of blood chemistry: Secondary | ICD-10-CM | POA: Diagnosis present

## 2015-05-21 DIAGNOSIS — I208 Other forms of angina pectoris: Secondary | ICD-10-CM

## 2015-05-21 DIAGNOSIS — L989 Disorder of the skin and subcutaneous tissue, unspecified: Secondary | ICD-10-CM | POA: Diagnosis not present

## 2015-05-21 DIAGNOSIS — E785 Hyperlipidemia, unspecified: Secondary | ICD-10-CM | POA: Diagnosis not present

## 2015-05-21 DIAGNOSIS — R748 Abnormal levels of other serum enzymes: Secondary | ICD-10-CM | POA: Insufficient documentation

## 2015-05-21 DIAGNOSIS — I739 Peripheral vascular disease, unspecified: Secondary | ICD-10-CM | POA: Diagnosis not present

## 2015-05-21 DIAGNOSIS — I509 Heart failure, unspecified: Secondary | ICD-10-CM | POA: Diagnosis not present

## 2015-05-21 DIAGNOSIS — I2089 Other forms of angina pectoris: Secondary | ICD-10-CM

## 2015-05-21 DIAGNOSIS — Z859 Personal history of malignant neoplasm, unspecified: Secondary | ICD-10-CM | POA: Insufficient documentation

## 2015-05-21 HISTORY — DX: Unspecified asthma, uncomplicated: J45.909

## 2015-05-21 HISTORY — DX: Malignant (primary) neoplasm, unspecified: C80.1

## 2015-05-21 LAB — PROTIME-INR
INR: 1.1
Prothrombin Time: 14.4 seconds (ref 11.4–15.0)

## 2015-05-21 LAB — CBC
HEMATOCRIT: 47.1 % — AB (ref 35.0–47.0)
Hemoglobin: 14.8 g/dL (ref 12.0–16.0)
MCH: 20.5 pg — ABNORMAL LOW (ref 26.0–34.0)
MCHC: 31.3 g/dL — ABNORMAL LOW (ref 32.0–36.0)
MCV: 65.4 fL — AB (ref 80.0–100.0)
Platelets: 338 10*3/uL (ref 150–440)
RBC: 7.2 MIL/uL — ABNORMAL HIGH (ref 3.80–5.20)
RDW: 19.2 % — AB (ref 11.5–14.5)
WBC: 7.7 10*3/uL (ref 3.6–11.0)

## 2015-05-21 LAB — BASIC METABOLIC PANEL
Anion gap: 10 (ref 5–15)
BUN: 6 mg/dL (ref 6–20)
CHLORIDE: 98 mmol/L — AB (ref 101–111)
CO2: 36 mmol/L — AB (ref 22–32)
Calcium: 10 mg/dL (ref 8.9–10.3)
Creatinine, Ser: 0.75 mg/dL (ref 0.44–1.00)
GFR calc Af Amer: >60 mL/min (ref >60)
GFR calc non Af Amer: >60 mL/min (ref >60)
GLUCOSE: 101 mg/dL — AB (ref 65–99)
POTASSIUM: 4 mmol/L (ref 3.5–5.1)
SODIUM: 144 mmol/L (ref 135–145)

## 2015-05-21 LAB — TROPONIN I: TROPONIN I: 0.19 ng/mL — AB (ref <0.031)

## 2015-05-21 MED ORDER — ONDANSETRON HCL 4 MG PO TABS: 4.0000 mg | ORAL_TABLET | Freq: Four times a day (QID) | ORAL | Status: DC | PRN

## 2015-05-21 MED ORDER — PIPERACILLIN-TAZOBACTAM 3.375 G IVPB
3.3750 g | Freq: Once | INTRAVENOUS | Status: AC
  Administered 2015-05-21: 3.375 g via INTRAVENOUS
  Filled 2015-05-21: qty 50

## 2015-05-21 MED ORDER — ACETAMINOPHEN 650 MG RE SUPP: 650.0000 mg | Freq: Four times a day (QID) | RECTAL | Status: DC | PRN

## 2015-05-21 MED ORDER — HYDROMORPHONE HCL 1 MG/ML IJ SOLN
1.0000 mg | Freq: Once | INTRAMUSCULAR | Status: AC
Start: 2015-05-21 — End: 2015-05-21
  Administered 2015-05-21: 1 mg via INTRAVENOUS
  Filled 2015-05-21: qty 1

## 2015-05-21 MED ORDER — ONDANSETRON HCL 4 MG/2ML IJ SOLN
4.0000 mg | Freq: Once | INTRAMUSCULAR | Status: AC
  Administered 2015-05-21: 4 mg via INTRAVENOUS
  Filled 2015-05-21: qty 2

## 2015-05-21 MED ORDER — SODIUM CHLORIDE 0.9 % IJ SOLN
3.0000 mL | Freq: Two times a day (BID) | INTRAMUSCULAR | Status: DC
  Administered 2015-05-22 – 2015-05-24 (×6): 3 mL via INTRAVENOUS

## 2015-05-21 MED ORDER — IOHEXOL 350 MG/ML SOLN
50.0000 mL | Freq: Once | INTRAVENOUS | Status: AC | PRN
  Administered 2015-05-21: 50 mL via INTRAVENOUS

## 2015-05-21 MED ORDER — HYDROMORPHONE HCL 1 MG/ML IJ SOLN
2.0000 mg | Freq: Once | INTRAMUSCULAR | Status: AC
  Administered 2015-05-21: 2 mg via INTRAVENOUS
  Filled 2015-05-21: qty 2

## 2015-05-21 MED ORDER — NITROGLYCERIN 0.4 MG SL SUBL: 0.4000 mg | SUBLINGUAL_TABLET | SUBLINGUAL | Status: DC | PRN

## 2015-05-21 MED ORDER — OXYCODONE HCL 5 MG PO TABS: 5.0000 mg | ORAL_TABLET | ORAL | Status: DC | PRN

## 2015-05-21 MED ORDER — ACETAMINOPHEN 325 MG PO TABS: 650.0000 mg | ORAL_TABLET | Freq: Four times a day (QID) | ORAL | Status: DC | PRN

## 2015-05-21 MED ORDER — HEPARIN SODIUM (PORCINE) 5000 UNIT/ML IJ SOLN
5000.0000 [IU] | Freq: Three times a day (TID) | INTRAMUSCULAR | Status: DC
  Administered 2015-05-22 – 2015-05-24 (×8): 5000 [IU] via SUBCUTANEOUS
  Filled 2015-05-21 (×7): qty 1

## 2015-05-21 MED ORDER — ONDANSETRON HCL 4 MG/2ML IJ SOLN: 4.0000 mg | Freq: Four times a day (QID) | INTRAMUSCULAR | Status: DC | PRN

## 2015-05-21 MED ORDER — SODIUM CHLORIDE 0.9 % IV BOLUS (SEPSIS)
500.0000 mL | Freq: Once | INTRAVENOUS | Status: AC
  Administered 2015-05-21: 500 mL via INTRAVENOUS

## 2015-05-21 MED ORDER — HYDROMORPHONE HCL 1 MG/ML IJ SOLN: 1.0000 mg | INTRAMUSCULAR | Status: DC | PRN

## 2015-05-21 NOTE — ED Notes (Signed)
Pt sipping on coffee. Pt states "that doctor known one milligram of dilaudid isn't going to touch me. i'm not fussing at you, he needs to give me more." pt informed will continue to monitor pain and let md know.

## 2015-05-21 NOTE — ED Notes (Signed)
NAD noted at this time. Pt resting in bed. VSS and WNL.

## 2015-05-21 NOTE — ED Notes (Signed)
Report given to charge RN, Apolonio Schneiders.

## 2015-05-21 NOTE — ED Notes (Signed)
Report given to April, RN

## 2015-05-21 NOTE — ED Notes (Signed)
Pt here with c/o chest pain, EMS gave 2 SL nitro and 324mg  of aspirin en route. Pt on 7th day of antibiotics for URI. Pt reports chest pain started at 1pm today, reports nausea without vomiting. Pt alert and oriented upon arrival.

## 2015-05-21 NOTE — ED Provider Notes (Signed)
[facility] Emergency Department Provider Note  ____________________________________________  Time seen: 1952  I have reviewed the triage vital signs and the nursing notes.   HISTORY  Chief Complaint Chest Pain      HPI Lynn Butler is a 69 y.o. female with history of CHF and COPD and recent skin cancer excision with graft, who presents emergency Department with complaint of chest pain on the right side with radiation into her right arm. The patient does use oxygen at home. She reports shortness of breath with this pain and pleuritic discomfort. She denies any fever. She was diagnosed with a upper is retracted infection 1 week ago and is on antibiotics.    Past Medical History  Diagnosis Date  . COPD (chronic obstructive pulmonary disease) ([place])   . Hypertension   . Ischemic cardiomyopathy     a.   . Coronary artery disease     a. s/p multiple stenting in 2005  . History of ventricular tachycardia   . PVD (peripheral vascular disease) ([place])   . PAD (peripheral artery disease) ([place])   . HLD (hyperlipidemia)   . Chronic back pain   . MI, old   . MRSA (methicillin resistant staph aureus) culture positive     left foot wound  . CHF (congestive heart failure) ([place])   . Pneumonia   . Asthma   . Cancer [facility])     Patient Active Problem List   Diagnosis Date Noted  . Right lower lobe pneumonia 04/07/2015  . Acute on chronic respiratory failure with hypoxia ([place]) 04/07/2015  . Acute renal failure ([place]) 04/07/2015  . Leukocytosis 04/07/2015  . Metabolic encephalopathy 16/04/9603  . HCAP (healthcare-associated pneumonia) 02/06/2015  . Sepsis ([place]) 02/06/2015  . Chronic respiratory failure with hypoxia ([place]) 02/01/2015  . Syncope and collapse 02/01/2015  . Chronic combined systolic and diastolic CHF (congestive heart failure) ([place]) 02/01/2015  . Myocardial infarction in recovery phase ([place]) 01/20/2015  . HTN (hypertension) 01/20/2015  . Acute MI  ([place]) 01/13/2015  . Chest pain 12/28/2014  . COPD exacerbation ([place]) 12/16/2014  . Protein-calorie malnutrition, severe ([place]) 07/22/2014  . Acute on chronic respiratory failure with hypoxemia ([place]) 07/21/2014  . COPD mixed type ([place]) 07/21/2014  . CAD (coronary artery disease) 07/21/2014  . CHF (congestive heart failure) ([place]) 07/21/2014  . PVD (peripheral vascular disease) ([place]) 07/21/2014  . Leg ulcer ([place]) 07/21/2014  . Skin lesion of face 07/21/2014  . Adult failure to thrive 07/21/2014  . Claudication of both lower extremities ([place]) 07/21/2014  . Smoker 07/21/2014    Past Surgical History  Procedure Laterality Date  . Insert / replace / remove pacemaker    . Vascular bypass surgery Bilateral   . Cardiac catheterization N/A 01/14/2015    Procedure: Left Heart Cath;  Surgeon: Wellington Hampshire, MD;  Location: [facility];  Service: Cardiovascular;  Laterality: N/A;  . Cardiac defibrillator placement    . Appendectomy    . Cesarean section    . Abdominal surgery      Current Outpatient Rx  Name  Route  Sig  Dispense  Refill  . albuterol (PROAIR HFA) 108 (90 BASE) MCG/ACT inhaler   Inhalation   Inhale 1-2 puffs into the lungs every 4 (four) hours as needed for wheezing or shortness of breath. Patient not taking: Reported on 04/07/2015         . atorvastatin (LIPITOR) 40 MG tablet   Oral   Take 1 tablet (40 mg total)  by mouth daily at 6 PM. Patient not taking: Reported on 04/07/2015   30 tablet   0   . benzonatate (TESSALON) 200 MG capsule   Oral   Take 1 capsule (200 mg total) by mouth 3 (three) times daily as needed for cough. Patient not taking: Reported on 04/07/2015   20 capsule   0   . carvedilol (COREG) 3.125 MG tablet   Oral   Take 1 tablet (3.125 mg total) by mouth 2 (two) times daily with a meal. Patient not taking: Reported on 04/07/2015   60 tablet   2   . diazepam (VALIUM) 10 MG tablet   Oral   Take 1 tablet (10 mg total) by mouth every 12  (twelve) hours as needed for anxiety.   30 tablet   0   . diazepam (VALIUM) 10 MG tablet   Oral   Take 1 tablet (10 mg total) by mouth every 12 (twelve) hours as needed for anxiety.   30 tablet   0   . diphenoxylate-atropine (LOMOTIL) 2.5-0.025 MG per tablet   Oral   Take 1 tablet by mouth every 6 (six) hours as needed for diarrhea or loose stools.         . feeding supplement, ENSURE ENLIVE, (ENSURE ENLIVE) LIQD   Oral   Take 237 mLs by mouth 3 (three) times daily with meals. Patient not taking: Reported on 04/07/2015   237 mL   12   . guaiFENesin (MUCINEX) 600 MG 12 hr tablet   Oral   Take 1 tablet (600 mg total) by mouth 2 (two) times daily. Patient not taking: Reported on 04/07/2015   20 tablet   0   . HYDROmorphone (DILAUDID) 4 MG tablet   Oral   Take 1 tablet (4 mg total) by mouth every 6 (six) hours as needed for severe pain.   15 tablet   0   . ipratropium-albuterol (DUONEB) 0.5-2.5 (3) MG/3ML SOLN   Nebulization   Take 3 mLs by nebulization every 6 (six) hours as needed. Patient not taking: Reported on 04/07/2015   360 mL   0   . isosorbide mononitrate (IMDUR) 30 MG 24 hr tablet   Oral   Take 1 tablet (30 mg total) by mouth daily. Patient not taking: Reported on 04/07/2015   30 tablet   0   . loperamide (IMODIUM A-D) 2 MG tablet   Oral   Take 1 tablet (2 mg total) by mouth 4 (four) times daily as needed for diarrhea or loose stools.   30 tablet   0   . LORazepam (ATIVAN) 1 MG tablet   Oral   Take 1 tablet (1 mg total) by mouth every 8 (eight) hours as needed for anxiety.   15 tablet   0   . mometasone-formoterol (DULERA) 100-5 MCG/ACT AERO   Inhalation   Inhale 2 puffs into the lungs 2 (two) times daily.         . OxyCODONE (OXYCONTIN) 80 mg T12A 12 hr tablet   Oral   Take 1 tablet (80 mg total) by mouth every 12 (twelve) hours.   14 tablet   0   . predniSONE (DELTASONE) 10 MG tablet      Label  & dispense according to the schedule below. 5  Pills PO for 1 day then, 4 Pills PO for 1 day, 3 Pills PO for 1 day, 2 Pills PO for 1 day, 1 Pill PO for 1 days then STOP.         Marland Kitchen  tiotropium (SPIRIVA) 18 MCG inhalation capsule   Inhalation   Place 1 capsule (18 mcg total) into inhaler and inhale daily.   30 capsule   12   . zolpidem (AMBIEN) 10 MG tablet   Oral   Take 1 tablet (10 mg total) by mouth at bedtime as needed for sleep.   10 tablet   0     Allergies Nsaids  Family History  Problem Relation Age of Onset  . CAD Other   . Breast cancer Mother   . Heart disease Mother   . Coronary artery disease Father     Social History Social History  Substance Use Topics  . Smoking status: Current Every Day Smoker -- 0.50 packs/day    Types: Cigarettes  . Smokeless tobacco: None  . Alcohol Use: No    Review of Systems  Constitutional: Negative for fever. ENT: Negative for sore throat. Cardiovascular: Positive for chest pain. Respiratory: Shortness of breath Gastrointestinal: Negative for abdominal pain, vomiting and diarrhea. Genitourinary: Negative for dysuria. Musculoskeletal: Pain in right shoulder and right arm. See history of present illness Skin: Recent skin graft on forehead secondary to skin cancer. Neurological: Negative for paresthesia or weakness   10-point ROS otherwise negative.  ____________________________________________   PHYSICAL EXAM:  VITAL SIGNS: ED Triage Vitals  Enc Vitals Group     BP 05/21/15 1857 152/86 mmHg     Pulse Rate 05/21/15 1857 85     Resp 05/21/15 1857 16     Temp 05/21/15 1857 98.6 F (37 C)     Temp Source 05/21/15 1857 Oral     SpO2 05/21/15 1857 100 %     Weight 05/21/15 1857 90 lb (40.824 kg)     Height 05/21/15 1857 5\' 5"  (1.651 m)     Head Cir --      [facility] Flow --      Pain Score --      Pain Loc --      Pain Edu? --      Excl. in [place]? --     Constitutional: Alert and oriented. Somewhat distracted appearing patient in no acute distress area. ENT    Head: Normocephalic and atraumatic.   Nose: No congestion/rhinnorhea.    Cardiovascular: Normal rate, regular rhythm, no murmur noted Respiratory:  Normal respiratory effort, no tachypnea.    Breath sounds are clear and equal bilaterally.  Gastrointestinal: Soft and nontender. No distention.  Back: No muscle spasm, no tenderness, no CVA tenderness. Musculoskeletal: No deformity noted. Nontender with normal range of motion in all extremities.  Patient is thin and frail, however, the right leg is slightly larger than the left. The patient believes this is new. Neurologic:  Normal speech and language. No gross focal neurologic deficits are appreciated.  Skin:  There is a large scabbed over lesion on her forehead, approximately 8 x 10 cm. No erythema.Marland Kitchen Psychiatric: Mood and affect are normal. Speech and behavior are normal.  ____________________________________________    LABS (pertinent positives/negatives)  Labs Reviewed  BASIC METABOLIC PANEL - Abnormal; Notable for the following:    Chloride 98 (*)    CO2 36 (*)    Glucose, Bld 101 (*)    All other components within normal limits  CBC - Abnormal; Notable for the following:    RBC 7.20 (*)    HCT 47.1 (*)    MCV 65.4 (*)    MCH 20.5 (*)    MCHC 31.3 (*)    RDW 19.2 (*)  All other components within normal limits  TROPONIN I - Abnormal; Notable for the following:    Troponin I 0.19 (*)    All other components within normal limits  PROTIME-INR     ____________________________________________   EKG  ED ECG REPORT I, Rui Wordell W, the attending physician, personally viewed and interpreted this ECG.   Date: 05/21/2015  EKG Time: 1857  Rate: 82  Rhythm: Normal sinus rhythm  Axis: Normal  Intervals: Normal  ST&T Change: None noted   ____________________________________________    RADIOLOGY  Chest x-ray IMPRESSION: Mild right basilar opacity which may represent atelectasis, mild airspace disease/  pneumonia or residual opacity as previously Identified.  CT chest, PE study:   IMPRESSION: No evidence of pulmonary embolus.  14 mm spiculated density noted laterally in the right upper lobe, as well as 11 mm density noted anteriorly in right lung apex. These lesions are concerning for malignancy. Also noted are multiple smaller other nodules inferiorly in the right middle lobe which may represent metastatic disease. Probable mild right hilar adenopathy is noted as well. PET scan is recommended for further evaluation.  Mild right posterior basilar subsegmental atelectasis or pneumonia is noted. ____________________________________________   PROCEDURES   ____________________________________________   INITIAL IMPRESSION / ASSESSMENT AND PLAN / ED COURSE  Pertinent labs & imaging results that were available during my care of the patient were reviewed by me and considered in my medical decision making (see chart for details).  69 year old female, thin and frail-appearing, congestive heart failure and COPD, who presents with chest pain in the right chest rating to the right shoulder and arm. CBC and hemoglobin levels are good. The troponin level is pending. Chest x-ray shows a mild right basilar opacity which may represent ectasis versus pneumonia.  Due to the chest discomfort, increased size of the right leg, and pleuritic nature of her discomfort, we will obtain a CT scan of her lungs to evaluate for pulmonary blood.  ----------------------------------------- 10:02 PM on 05/21/2015 -----------------------------------------  CT scan of the lungs was negative for PE. There is an area of concern for either atelectasis or pneumonia. The patient continues to complain of right chest pain.  Patient's troponin is elevated at 0.19. There are not many comparisons in her blood tests over the past month and a half. The last troponin level was see is above 1.0.  Regardless, given her pain,  shortness of breath, and an elevation in troponin, it is reasonable to admit her to the hospital for further care and treatment of this possible pneumonia.  ____________________________________________   FINAL CLINICAL IMPRESSION(S) / ED DIAGNOSES  Final diagnoses:  Right-sided chest pain  Chronic obstructive pulmonary disease, unspecified COPD type ([place])  Troponin I above reference range  Community acquired pneumonia   chronic pain    Ahmed Prima, MD 05/21/15 2207

## 2015-05-21 NOTE — ED Notes (Signed)
Pt taken to Xray.

## 2015-05-21 NOTE — ED Notes (Signed)
ECG and blood sent

## 2015-05-22 DIAGNOSIS — R079 Chest pain, unspecified: Secondary | ICD-10-CM | POA: Diagnosis not present

## 2015-05-22 LAB — TROPONIN I
Troponin I: 0.15 ng/mL — ABNORMAL HIGH (ref <0.031)
Troponin I: 0.15 ng/mL — ABNORMAL HIGH (ref <0.031)
Troponin I: 0.09 ng/mL — ABNORMAL HIGH (ref <0.031)

## 2015-05-22 LAB — MRSA PCR SCREENING: MRSA BY PCR: NEGATIVE

## 2015-05-22 MED ORDER — ATORVASTATIN CALCIUM 20 MG PO TABS
40.0000 mg | ORAL_TABLET | Freq: Every day | ORAL | Status: DC
  Administered 2015-05-22 – 2015-05-23 (×2): 40 mg via ORAL
  Filled 2015-05-22 (×2): qty 2

## 2015-05-22 MED ORDER — OXYCODONE HCL ER 20 MG PO T12A
80.0000 mg | EXTENDED_RELEASE_TABLET | Freq: Two times a day (BID) | ORAL | Status: DC
  Administered 2015-05-22 – 2015-05-24 (×5): 80 mg via ORAL
  Filled 2015-05-22 (×5): qty 4

## 2015-05-22 MED ORDER — ZOLPIDEM TARTRATE 5 MG PO TABS
10.0000 mg | ORAL_TABLET | Freq: Every evening | ORAL | Status: DC | PRN
  Administered 2015-05-23: 10 mg via ORAL
  Filled 2015-05-22: qty 2

## 2015-05-22 MED ORDER — HYDROMORPHONE HCL 2 MG PO TABS
4.0000 mg | ORAL_TABLET | Freq: Four times a day (QID) | ORAL | Status: DC | PRN
  Administered 2015-05-22 – 2015-05-24 (×8): 4 mg via ORAL
  Filled 2015-05-22 (×8): qty 2

## 2015-05-22 MED ORDER — DIAZEPAM 5 MG PO TABS
10.0000 mg | ORAL_TABLET | Freq: Two times a day (BID) | ORAL | Status: DC | PRN
  Administered 2015-05-22 – 2015-05-24 (×4): 10 mg via ORAL
  Filled 2015-05-22 (×4): qty 2

## 2015-05-22 MED ORDER — IPRATROPIUM-ALBUTEROL 0.5-2.5 (3) MG/3ML IN SOLN: 3.0000 mL | RESPIRATORY_TRACT | Status: DC | PRN

## 2015-05-22 MED ORDER — GUAIFENESIN ER 600 MG PO TB12
600.0000 mg | ORAL_TABLET | Freq: Two times a day (BID) | ORAL | Status: DC
  Administered 2015-05-22 – 2015-05-24 (×6): 600 mg via ORAL
  Filled 2015-05-22 (×6): qty 1

## 2015-05-22 MED ORDER — ISOSORBIDE MONONITRATE ER 30 MG PO TB24
30.0000 mg | ORAL_TABLET | Freq: Every day | ORAL | Status: DC
  Administered 2015-05-22 – 2015-05-24 (×3): 30 mg via ORAL
  Filled 2015-05-22 (×4): qty 1

## 2015-05-22 MED ORDER — ALBUTEROL SULFATE (2.5 MG/3ML) 0.083% IN NEBU: 3.0000 mL | INHALATION_SOLUTION | RESPIRATORY_TRACT | Status: DC | PRN

## 2015-05-22 MED ORDER — ENSURE ENLIVE PO LIQD
237.0000 mL | Freq: Three times a day (TID) | ORAL | Status: DC
  Administered 2015-05-22 – 2015-05-24 (×6): 237 mL via ORAL

## 2015-05-22 MED ORDER — OXYCODONE HCL ER 20 MG PO T12A
80.0000 mg | EXTENDED_RELEASE_TABLET | Freq: Once | ORAL | Status: AC
  Administered 2015-05-22: 80 mg via ORAL
  Filled 2015-05-22: qty 4

## 2015-05-22 MED ORDER — CARVEDILOL 3.125 MG PO TABS
3.1250 mg | ORAL_TABLET | Freq: Two times a day (BID) | ORAL | Status: DC
  Administered 2015-05-22 – 2015-05-24 (×4): 3.125 mg via ORAL
  Filled 2015-05-22 (×6): qty 1

## 2015-05-22 MED ORDER — BENZONATATE 100 MG PO CAPS: 200.0000 mg | ORAL_CAPSULE | Freq: Three times a day (TID) | ORAL | Status: DC | PRN

## 2015-05-22 MED ORDER — TIOTROPIUM BROMIDE MONOHYDRATE 18 MCG IN CAPS
18.0000 ug | ORAL_CAPSULE | Freq: Every day | RESPIRATORY_TRACT | Status: DC
  Administered 2015-05-22 – 2015-05-24 (×3): 18 ug via RESPIRATORY_TRACT
  Filled 2015-05-22: qty 5

## 2015-05-22 MED ORDER — LOPERAMIDE HCL 2 MG PO CAPS
2.0000 mg | ORAL_CAPSULE | Freq: Four times a day (QID) | ORAL | Status: DC | PRN
Start: 2015-05-22 — End: 2015-05-24

## 2015-05-22 MED ORDER — PREGABALIN 75 MG PO CAPS
75.0000 mg | ORAL_CAPSULE | Freq: Every day | ORAL | Status: DC
  Administered 2015-05-22 – 2015-05-24 (×3): 75 mg via ORAL
  Filled 2015-05-22 (×3): qty 1

## 2015-05-22 MED ORDER — MOMETASONE FURO-FORMOTEROL FUM 100-5 MCG/ACT IN AERO
2.0000 | INHALATION_SPRAY | Freq: Two times a day (BID) | RESPIRATORY_TRACT | Status: DC
  Administered 2015-05-22 – 2015-05-24 (×5): 2 via RESPIRATORY_TRACT
  Filled 2015-05-22: qty 8.8

## 2015-05-22 MED ORDER — SODIUM CHLORIDE 0.9 % IV SOLN
Freq: Once | INTRAVENOUS | Status: AC
  Administered 2015-05-22: 14:00:00 via INTRAVENOUS

## 2015-05-22 NOTE — H&P (Signed)
[facility] [place] NAME: Lynn Butler    MR#:  [id number]  DATE OF BIRTH:  June 28, 1946   DATE OF ADMISSION:  05/21/2015  PRIMARY CARE PHYSICIAN: Juanell Fairly, MD   REQUESTING/REFERRING PHYSICIAN: Thomasene Lot  CHIEF COMPLAINT:   Chief Complaint  Patient presents with  . Chest Pain    HISTORY OF PRESENT ILLNESS:  Lynn Butler  is a 69 y.o. female with a known history of coronary artery disease status post PCI and stent placement, AICD placement, COPD oxygen dependent, essential hypertension presenting with chest pain. She describes chest pain which occurred at rest approximately 1 AM retrosternal in location with radiation to the right arm/shoulder associated shortness of breath. Intensity 10/10 and no worsening or relieving factors. She states that "it felt like her heart attacks" emergency department course: She was noted have elevated troponin however was actually less than previous  PAST MEDICAL HISTORY:   Past Medical History  Diagnosis Date  . COPD (chronic obstructive pulmonary disease) ([place])   . Hypertension   . Ischemic cardiomyopathy     a.   . Coronary artery disease     a. s/p multiple stenting in 2005  . History of ventricular tachycardia   . PVD (peripheral vascular disease) ([place])   . PAD (peripheral artery disease) ([place])   . HLD (hyperlipidemia)   . Chronic back pain   . MI, old   . MRSA (methicillin resistant staph aureus) culture positive     left foot wound  . CHF (congestive heart failure) ([place])   . Pneumonia   . Asthma   . Cancer [facility])     PAST SURGICAL HISTORY:   Past Surgical History  Procedure Laterality Date  . Insert / replace / remove pacemaker    . Vascular bypass surgery Bilateral   . Cardiac catheterization N/A 01/14/2015    Procedure: Left Heart Cath;  Surgeon: Wellington Hampshire, MD;  Location: [facility];  Service: Cardiovascular;  Laterality: N/A;  . Cardiac  defibrillator placement    . Appendectomy    . Cesarean section    . Abdominal surgery      SOCIAL HISTORY:   Social History  Substance Use Topics  . Smoking status: Current Every Day Smoker -- 0.50 packs/day    Types: Cigarettes  . Smokeless tobacco: Not on file  . Alcohol Use: No    FAMILY HISTORY:   Family History  Problem Relation Age of Onset  . CAD Other   . Breast cancer Mother   . Heart disease Mother   . Coronary artery disease Father     DRUG ALLERGIES:   Allergies  Allergen Reactions  . Nsaids Nausea And Vomiting and Other (See Comments)    Reaction:  GI distress and burning     REVIEW OF SYSTEMS:  REVIEW OF SYSTEMS:  CONSTITUTIONAL: Denies fevers, chills, positive fatigue, weakness.  EYES: Denies blurred vision, double vision, or eye pain.  EARS, NOSE, THROAT: Denies tinnitus, ear pain, hearing loss.  RESPIRATORY: Positive cough, shortness of breath, denies wheezing  CARDIOVASCULAR: Positive chest pain, denies palpitations, edema.  GASTROINTESTINAL: Denies nausea, vomiting, diarrhea, abdominal pain.  GENITOURINARY: Denies dysuria, hematuria.  ENDOCRINE: Denies nocturia or thyroid problems. HEMATOLOGIC AND LYMPHATIC: Denies easy bruising or bleeding.  SKIN:  lesions on forehead.  MUSCULOSKELETAL: Denies pain in neck, back, shoulder, knees, hips, or further arthritic symptoms.  NEUROLOGIC: Denies paralysis, paresthesias.  PSYCHIATRIC: Denies anxiety or depressive symptoms. Otherwise  full review of systems performed by me is negative.   MEDICATIONS AT HOME:   Prior to Admission medications   Medication Sig Start Date End Date Taking? Authorizing Provider  albuterol (PROAIR HFA) 108 (90 BASE) MCG/ACT inhaler Inhale 1-2 puffs into the lungs every 4 (four) hours as needed for wheezing or shortness of breath. 07/28/14  Yes Marijean Heath, NP  atorvastatin (LIPITOR) 40 MG tablet Take 1 tablet (40 mg total) by mouth daily at 6 PM. 01/17/15  Yes Gladstone Lighter, MD  benzonatate (TESSALON) 200 MG capsule Take 1 capsule (200 mg total) by mouth 3 (three) times daily as needed for cough. 02/11/15  Yes Vaughan Basta, MD  carvedilol (COREG) 3.125 MG tablet Take 1 tablet (3.125 mg total) by mouth 2 (two) times daily with a meal. 01/17/15  Yes Gladstone Lighter, MD  diazepam (VALIUM) 10 MG tablet Take 1 tablet (10 mg total) by mouth every 12 (twelve) hours as needed for anxiety. 02/03/15  Yes Nicholes Mango, MD  feeding supplement, ENSURE ENLIVE, (ENSURE ENLIVE) LIQD Take 237 mLs by mouth 3 (three) times daily with meals. 02/11/15  Yes Vaughan Basta, MD  guaiFENesin (MUCINEX) 600 MG 12 hr tablet Take 1 tablet (600 mg total) by mouth 2 (two) times daily. 02/11/15  Yes Vaughan Basta, MD  HYDROmorphone (DILAUDID) 4 MG tablet Take 1 tablet (4 mg total) by mouth every 6 (six) hours as needed for severe pain. 04/11/15  Yes Henreitta Leber, MD  ipratropium-albuterol (DUONEB) 0.5-2.5 (3) MG/3ML SOLN Take 3 mLs by nebulization every 6 (six) hours as needed. 02/11/15  Yes Vaughan Basta, MD  isosorbide mononitrate (IMDUR) 30 MG 24 hr tablet Take 1 tablet (30 mg total) by mouth daily. 02/11/15  Yes Vaughan Basta, MD  loperamide (IMODIUM A-D) 2 MG tablet Take 1 tablet (2 mg total) by mouth 4 (four) times daily as needed for diarrhea or loose stools. 04/11/15  Yes Henreitta Leber, MD  mometasone-formoterol (DULERA) 100-5 MCG/ACT AERO Inhale 2 puffs into the lungs 2 (two) times daily. 04/11/15  Yes Henreitta Leber, MD  OxyCODONE (OXYCONTIN) 80 mg T12A 12 hr tablet Take 1 tablet (80 mg total) by mouth every 12 (twelve) hours. 04/11/15  Yes Henreitta Leber, MD  tiotropium (SPIRIVA) 18 MCG inhalation capsule Place 1 capsule (18 mcg total) into inhaler and inhale daily. 04/11/15  Yes Henreitta Leber, MD  zolpidem (AMBIEN) 10 MG tablet Take 1 tablet (10 mg total) by mouth at bedtime as needed for sleep. 01/17/15  Yes Gladstone Lighter, MD  LORazepam  (ATIVAN) 1 MG tablet Take 1 tablet (1 mg total) by mouth every 8 (eight) hours as needed for anxiety. Patient not taking: Reported on 05/21/2015 04/11/15   Henreitta Leber, MD  predniSONE (DELTASONE) 10 MG tablet Label  & dispense according to the schedule below. 5 Pills PO for 1 day then, 4 Pills PO for 1 day, 3 Pills PO for 1 day, 2 Pills PO for 1 day, 1 Pill PO for 1 days then STOP. Patient not taking: Reported on 05/21/2015 04/11/15   Henreitta Leber, MD      VITAL SIGNS:  Blood pressure 106/62, pulse 76, temperature 98.6 F (37 C), temperature source Oral, resp. rate 22, height 5\' 5"  (1.651 m), weight 90 lb (40.824 kg), SpO2 95 %.  PHYSICAL EXAMINATION:  VITAL SIGNS: Filed Vitals:   05/21/15 2345  BP:   Pulse: 76  Temp:   Resp:    GENERAL:69 y.o.female currently  in no acute distress. Chronically ill-appearing HEAD: Normocephalic, atraumatic.  EYES: Pupils equal, round, reactive to light. Extraocular muscles intact. No scleral icterus.  MOUTH: Moist mucosal membrane. Dentition intact. No abscess noted.  EAR, NOSE, THROAT: Clear without exudates. No external lesions.  NECK: Supple. No thyromegaly. No nodules. No JVD.  PULMONARY: Clear to ascultation, without wheeze rails or rhonci. No use of accessory muscles, Good respiratory effort. good air entry bilaterally CHEST: Nontender to palpation.  CARDIOVASCULAR: S1 and S2. Regular rate and rhythm. No murmurs, rubs, or gallops. No edema. Pedal pulses 2+ bilaterally.  GASTROINTESTINAL: Soft, nontender, nondistended. No masses. Positive bowel sounds. No hepatosplenomegaly.  MUSCULOSKELETAL: No swelling, clubbing, or edema. Range of motion full in all extremities.  NEUROLOGIC: Cranial nerves II through XII are intact. No gross focal neurological deficits. Sensation intact. Reflexes intact.  SKIN: Fungating lesion from left side for head, small ulceration left shin without surrounding erythema No further ulceration, lesions, rashes, or  cyanosis. Skin warm and dry. Turgor intact.  PSYCHIATRIC: Mood, affect within normal limits. The patient is awake, alert and oriented x 3. Insight, judgment intact.    LABORATORY PANEL:   CBC  Recent Labs Lab 05/21/15 1911  WBC 7.7  HGB 14.8  HCT 47.1*  PLT 338   ------------------------------------------------------------------------------------------------------------------  Chemistries   Recent Labs Lab 05/21/15 1911  NA 144  K 4.0  CL 98*  CO2 36*  GLUCOSE 101*  BUN 6  CREATININE 0.75  CALCIUM 10.0   ------------------------------------------------------------------------------------------------------------------  Cardiac Enzymes  Recent Labs Lab 05/20/15 2030  TROPONINI 0.19*   ------------------------------------------------------------------------------------------------------------------  RADIOLOGY:  Dg Chest 2 View  05/21/2015  CLINICAL DATA:  69 year old female with acute chest pain, shortness of breath and cough for 1 day. EXAM: CHEST  2 VIEW COMPARISON:  04/07/2015 and prior chest radiographs FINDINGS: Cardiomegaly and left-sided AICD again noted. Mild right basilar opacity may represent atelectasis, mild airspace disease or residual opacity as previously seen. There is no evidence of pleural effusion, pneumothorax, pulmonary edema or pulmonary mass. No acute bony abnormalities are identified. IMPRESSION: Mild right basilar opacity which may represent atelectasis, mild airspace disease/ pneumonia or residual opacity as previously identified. Cardiomegaly. Electronically Signed   By: Margarette Canada M.D.   On: 05/21/2015 19:53   Ct Angio Chest Pe W/cm &/or Wo Cm  05/21/2015  CLINICAL DATA:  Acute chest pain, shortness of breath. EXAM: CT ANGIOGRAPHY CHEST WITH CONTRAST TECHNIQUE: Multidetector CT imaging of the chest was performed using the standard protocol during bolus administration of intravenous contrast. Multiplanar CT image reconstructions and MIPs were  obtained to evaluate the vascular anatomy. CONTRAST:  51mL OMNIPAQUE IOHEXOL 350 MG/ML SOLN COMPARISON:  Radiograph of same day. FINDINGS: No pneumothorax or pleural effusion is noted. Mild right posterior basilar subsegmental atelectasis is noted. 14 x 10 mm spiculated density is noted laterally in the right upper lobe with pleural tail concerning for malignancy. 11 x 10 mm density is noted anteriorly in the right lung apex concerning for malignancy. Multiple smaller other nodules are noted inferiorly in the right middle lobe which may represent metastatic disease. There is no evidence of pulmonary embolus. Atherosclerosis of thoracic aorta is noted without aneurysm or dissection. Visualized portion of upper abdomen is unremarkable. Mild right hilar adenopathy is noted, which measures 24 x 15 mm, is concerning for metastatic disease. Review of the MIP images confirms the above findings. IMPRESSION: No evidence of pulmonary embolus. 14 mm spiculated density noted laterally in the right upper lobe, as well as  11 mm density noted anteriorly in right lung apex. These lesions are concerning for malignancy. Also noted are multiple smaller other nodules inferiorly in the right middle lobe which may represent metastatic disease. Probable mild right hilar adenopathy is noted as well. PET scan is recommended for further evaluation. Mild right posterior basilar subsegmental atelectasis or pneumonia is noted. Electronically Signed   By: Marijo Conception, M.D.   On: 05/21/2015 21:00    EKG:   Orders placed or performed during the hospital encounter of 05/21/15  . ED EKG within 10 minutes  . ED EKG within 10 minutes    IMPRESSION AND PLAN:   69 year old Caucasian female chronically ill history of coronary disease status post multiple stenting who is presenting with chest pain.  1.Chest pain, central: Initiate aspirin and statin therapy, admitted to telemetry, trend cardiac enzymes 3,  if continued elevation will  initiate heparin drip ,nitroglycerin when necessary, morphine when necessary. Concern for elevated troponin however is magnitude less than when previously performed hopefully this is reminiscent some downward trend regardless will follow cardiac enzyme if upward will initiate heparin associated above.  2. COPD not in acute exacerbation: Continue with home medications including albuterol, DuoNeb treatments, supplemental oxygen, Spiriva 3. Hyperlipidemia unspecified: Lipitor 4. Venous thromboembolism prophylactic: Heparin subcutaneous     All the records are reviewed and case discussed with ED provider. Management plans discussed with the patient, family and they are in agreement.  CODE STATUS: Full  TOTAL TIME TAKING CARE OF THIS PATIENT: 35 minutes.    Siri Buege,  Karenann Cai.D on 05/22/2015 at 12:03 AM  Between 7am to 6pm - Pager - [phone]  After 6pm: House Pager: - [phone]  [facility]  Office  [phone]  CC: Primary care physician; Juanell Fairly, MD

## 2015-05-22 NOTE — Progress Notes (Signed)
[place] at [facility]                                                                                                                                                                                            Patient Demographics   Lynn Butler, is a 69 y.o. female, DOB - 07/15/1946, VOH:[id number]  Admit date - 05/21/2015   Admitting Physician Lytle Butte, MD  Outpatient Primary MD for the patient is Juanell Fairly, MD   Subjective: Patient admitted with chest pain history of severe coronary artery disease, has poor follow-up continues to complain of pain in her chest and foot     Review of Systems:   CONSTITUTIONAL: No documented fever. No fatigue, weakness. No weight gain, no weight loss.  EYES: No blurry or double vision.  ENT: No tinnitus. No postnasal drip. No redness of the oropharynx.  RESPIRATORY: No cough, no wheeze, no hemoptysis. No dyspnea.  CARDIOVASCULAR: Positive chest pain. No orthopnea. No palpitations. No syncope.  GASTROINTESTINAL: No nausea, no vomiting or diarrhea. No abdominal pain. No melena or hematochezia.  GENITOURINARY: No dysuria or hematuria.  ENDOCRINE: No polyuria or nocturia. No heat or cold intolerance.  HEMATOLOGY: No anemia. No bruising. No bleeding.  INTEGUMENTARY: No rashes. No lesions.  MUSCULOSKELETAL: No arthritis. No swelling. No gout. Also her in the legs complains of pain NEUROLOGIC: No numbness, tingling, or ataxia. No seizure-type activity.  PSYCHIATRIC: No anxiety. No insomnia. No ADD.    Vitals:   Filed Vitals:   05/22/15 0030 05/22/15 0109 05/22/15 0554 05/22/15 0802  BP: 102/61 137/69 109/59 112/58  Pulse: 76 78 72 73  Temp:  98.4 F (36.9 C) 98 F (36.7 C)   TempSrc:  Oral Oral   Resp:  17 18   Height:      Weight:      SpO2: 95% 100% 99%     Wt Readings from Last 3 Encounters:  05/21/15 40.824 kg (90 lb)  04/08/15 53.116 kg (117 lb 1.6 oz)  02/10/15 45.859 kg (101 lb 1.6 oz)      Intake/Output Summary (Last 24 hours) at 05/22/15 1136 Last data filed at 05/22/15 0830  Gross per 24 hour  Intake      0 ml  Output      0 ml  Net      0 ml    Physical Exam:   GENERAL: Pleasant-appearing in no apparent distress.  HEAD, EYES, EARS, NOSE AND THROAT: Atraumatic, normocephalic. Extraocular muscles are intact. Pupils equal and reactive to light. Sclerae anicteric. No conjunctival injection. No oro-pharyngeal erythema.  NECK: Supple.  There is no jugular venous distention. No bruits, no lymphadenopathy, no thyromegaly.  HEART: Regular rate and rhythm,. No murmurs, no rubs, no clicks.  LUNGS: Clear to auscultation bilaterally. No rales or rhonchi. No wheezes.  ABDOMEN: Soft, flat, nontender, nondistended. Has good bowel sounds. No hepatosplenomegaly appreciated.  EXTREMITIES: No evidence of any cyanosis, clubbing, or peripheral edema.  +2 pedal and radial pulses bilaterally.  NEUROLOGIC: The patient is alert, awake, and oriented x3 with no focal motor or sensory deficits appreciated bilaterally.  SKIN: Moist and warm  has lower extremity ulcers which are dressed , large skin ulceration on the forehead Psych: Not anxious, depressed LN: No inguinal LN enlargement    Antibiotics   Anti-infectives    Start     Dose/Rate Route Frequency Ordered Stop   05/21/15 2215  piperacillin-tazobactam (ZOSYN) IVPB 3.375 g     3.375 g 12.5 mL/hr over 240 Minutes Intravenous  Once 05/21/15 2207 05/22/15 0042      Medications   Scheduled Meds: . atorvastatin  40 mg Oral q1800  . carvedilol  3.125 mg Oral BID WC  . feeding supplement (ENSURE ENLIVE)  237 mL Oral TID WC  . guaiFENesin  600 mg Oral BID  . heparin  5,000 Units Subcutaneous 3 times per day  . isosorbide mononitrate  30 mg Oral Daily  . mometasone-formoterol  2 puff Inhalation BID  . OxyCODONE  80 mg Oral Q12H  . pregabalin  75 mg Oral Daily  . sodium chloride  3 mL Intravenous Q12H  . tiotropium  18 mcg  Inhalation Daily   Continuous Infusions:  PRN Meds:.acetaminophen **OR** acetaminophen, albuterol, benzonatate, diazepam, HYDROmorphone, ipratropium-albuterol, loperamide, nitroGLYCERIN, ondansetron **OR** ondansetron (ZOFRAN) IV, zolpidem   Data Review:   Micro Results Recent Results (from the past 240 hour(s))  Culture, blood (routine x 2)     Status: None (Preliminary result)   Collection Time: 05/21/15 10:49 PM  Result Value Ref Range Status   Specimen Description BLOOD RIGHT HAND  Final   Special Requests BOTTLES DRAWN AEROBIC AND ANAEROBIC 4CC  Final   Culture NO GROWTH < 12 HOURS  Final   Report Status PENDING  Incomplete  Culture, blood (routine x 2)     Status: None (Preliminary result)   Collection Time: 05/21/15 11:00 PM  Result Value Ref Range Status   Specimen Description BLOOD LEFT ASSIST CONTROL  Final   Special Requests BOTTLES DRAWN AEROBIC AND ANAEROBIC 4CC  Final   Culture NO GROWTH < 12 HOURS  Final   Report Status PENDING  Incomplete  MRSA PCR Screening     Status: None   Collection Time: 05/22/15  1:46 AM  Result Value Ref Range Status   MRSA by PCR NEGATIVE NEGATIVE Final    Comment:        The GeneXpert MRSA Assay (FDA approved for NASAL specimens only), is one component of a comprehensive MRSA colonization surveillance program. It is not intended to diagnose MRSA infection nor to guide or monitor treatment for MRSA infections.     Radiology Reports Dg Chest 2 View  05/21/2015  CLINICAL DATA:  69 year old female with acute chest pain, shortness of breath and cough for 1 day. EXAM: CHEST  2 VIEW COMPARISON:  04/07/2015 and prior chest radiographs FINDINGS: Cardiomegaly and left-sided AICD again noted. Mild right basilar opacity may represent atelectasis, mild airspace disease or residual opacity as previously seen. There is no evidence of pleural effusion, pneumothorax, pulmonary edema or pulmonary mass. No acute bony  abnormalities are identified.  IMPRESSION: Mild right basilar opacity which may represent atelectasis, mild airspace disease/ pneumonia or residual opacity as previously identified. Cardiomegaly. Electronically Signed   By: Margarette Canada M.D.   On: 05/21/2015 19:53   Ct Angio Chest Pe W/cm &/or Wo Cm  05/21/2015  CLINICAL DATA:  Acute chest pain, shortness of breath. EXAM: CT ANGIOGRAPHY CHEST WITH CONTRAST TECHNIQUE: Multidetector CT imaging of the chest was performed using the standard protocol during bolus administration of intravenous contrast. Multiplanar CT image reconstructions and MIPs were obtained to evaluate the vascular anatomy. CONTRAST:  24mL OMNIPAQUE IOHEXOL 350 MG/ML SOLN COMPARISON:  Radiograph of same day. FINDINGS: No pneumothorax or pleural effusion is noted. Mild right posterior basilar subsegmental atelectasis is noted. 14 x 10 mm spiculated density is noted laterally in the right upper lobe with pleural tail concerning for malignancy. 11 x 10 mm density is noted anteriorly in the right lung apex concerning for malignancy. Multiple smaller other nodules are noted inferiorly in the right middle lobe which may represent metastatic disease. There is no evidence of pulmonary embolus. Atherosclerosis of thoracic aorta is noted without aneurysm or dissection. Visualized portion of upper abdomen is unremarkable. Mild right hilar adenopathy is noted, which measures 24 x 15 mm, is concerning for metastatic disease. Review of the MIP images confirms the above findings. IMPRESSION: No evidence of pulmonary embolus. 14 mm spiculated density noted laterally in the right upper lobe, as well as 11 mm density noted anteriorly in right lung apex. These lesions are concerning for malignancy. Also noted are multiple smaller other nodules inferiorly in the right middle lobe which may represent metastatic disease. Probable mild right hilar adenopathy is noted as well. PET scan is recommended for further evaluation. Mild right posterior  basilar subsegmental atelectasis or pneumonia is noted. Electronically Signed   By: Marijo Conception, M.D.   On: 05/21/2015 21:00     CBC  Recent Labs Lab 05/21/15 1911  WBC 7.7  HGB 14.8  HCT 47.1*  PLT 338  MCV 65.4*  MCH 20.5*  MCHC 31.3*  RDW 19.2*    Chemistries   Recent Labs Lab 05/21/15 1911  NA 144  K 4.0  CL 98*  CO2 36*  GLUCOSE 101*  BUN 6  CREATININE 0.75  CALCIUM 10.0   ------------------------------------------------------------------------------------------------------------------ estimated creatinine clearance is 43.4 mL/min (by C-G formula based on Cr of 0.75). ------------------------------------------------------------------------------------------------------------------ No results for input(s): HGBA1C in the last 72 hours. ------------------------------------------------------------------------------------------------------------------ No results for input(s): CHOL, HDL, LDLCALC, TRIG, CHOLHDL, LDLDIRECT in the last 72 hours. ------------------------------------------------------------------------------------------------------------------ No results for input(s): TSH, T4TOTAL, T3FREE, THYROIDAB in the last 72 hours.  Invalid input(s): FREET3 ------------------------------------------------------------------------------------------------------------------ No results for input(s): VITAMINB12, FOLATE, FERRITIN, TIBC, IRON, RETICCTPCT in the last 72 hours.  Coagulation profile  Recent Labs Lab 05/21/15 1911  INR 1.10    No results for input(s): DDIMER in the last 72 hours.  Cardiac Enzymes  Recent Labs Lab 05/20/15 2030 05/22/15 0118 05/22/15 0651  TROPONINI 0.19* 0.15* 0.15*   ------------------------------------------------------------------------------------------------------------------ Invalid input(s): POCBNP    Assessment & Plan   69 year old Caucasian female chronically ill history of coronary disease status post multiple  stenting who is presenting with chest pain.  1.Chest pain, central: . History of stents a few months prior. Apparently does not follow up with cardiology. Continue imdure, allergic to aspirin and Ranexa to her current regimen cardiology consult,   2. COPD not in acute exacerbation: Continue with home medications including albuterol, DuoNeb treatments, supplemental  oxygen, Spiriva 3. Hyperlipidemia unspecified: Lipitor 4.  chronic lower extremity wound continue current care outpatient follow-up with wound care 5. Chronic pain continue Dilaudid and OxyContin add Lyrica to her current regimen 6. Likely skin cancer in the forehead has been referred to dermatology not clear if she has followed up for that needs to be addressed as outpatient by her primary care provider        Code Status Orders        Start     Ordered   05/21/15 2347  Full code   Continuous     05/21/15 2347           Consults  cardiology   DVT Prophylaxisheparin  Lab Results  Component Value Date   PLT 338 05/21/2015     Time Spent in minutes 45 minutes     Dustin Flock M.D on 05/22/2015 at 11:36 AM  Between 7am to 6pm - Pager - [phone]  After 6pm go to [email or web address] - password [email or web address] [facility]   Office  [phone]

## 2015-05-22 NOTE — Progress Notes (Addendum)
Spoke with Dr. Posey Pronto on rounds and RN asked if he wanted scheduled Coreg and Imdur to be given this AM with her BP as follows:  Filed Vitals:   05/22/15 0802  BP: 112/58  Pulse: 73  Temp:   Resp:   Per MD, give both as ordered.

## 2015-05-22 NOTE — Progress Notes (Addendum)
Filed Vitals:   05/22/15 1355  BP: 94/43  Pulse: 71  Temp: 98 F (36.7 C)  Resp: 20   Dr. Posey Pronto paged regarding BP above. Patient resting quietly in room, asymptomatic, no complaints of discomfort. MD gave verbal order to administer 500cc at 100cc/hr. No further orders, will continue to monitor.

## 2015-05-22 NOTE — Progress Notes (Signed)
Admission skin assessment with Cristopher Peru, RN

## 2015-05-23 DIAGNOSIS — R079 Chest pain, unspecified: Secondary | ICD-10-CM | POA: Diagnosis not present

## 2015-05-23 MED ORDER — RANOLAZINE ER 500 MG PO TB12
500.0000 mg | ORAL_TABLET | Freq: Two times a day (BID) | ORAL | Status: DC
  Administered 2015-05-23 – 2015-05-24 (×3): 500 mg via ORAL
  Filled 2015-05-23 (×3): qty 1

## 2015-05-23 MED ORDER — COLLAGENASE 250 UNIT/GM EX OINT
TOPICAL_OINTMENT | Freq: Every day | CUTANEOUS | Status: DC
  Administered 2015-05-23 – 2015-05-24 (×2): via TOPICAL
  Filled 2015-05-23: qty 30

## 2015-05-23 NOTE — Progress Notes (Signed)
Dr. Erin Fulling aware that patient is refusing stress test today. Said she would probably be willing to have it tomorrow instead. Pt stated she is scared and needs time to think about it. Educated on test.

## 2015-05-23 NOTE — Progress Notes (Signed)
Dr. Posey Pronto aware of BP, still wants patient to have scheduled imdur

## 2015-05-23 NOTE — Progress Notes (Signed)
Initial Nutrition Assessment  DOCUMENTATION CODES:   Severe malnutrition in context of chronic illness  INTERVENTION:   Meals and Snacks: Cater to patient preferences Medical Food Supplement Therapy: agree with Ensure Enlive TID with meals as ordered; each supplement provides 350 kcal and 20 grams of protein   NUTRITION DIAGNOSIS:   Malnutrition related to chronic illness as evidenced by energy intake < 75% for > or equal to 1 month, severe depletion of body fat, severe depletion of muscle mass, percent weight loss.  GOAL:   Patient will meet greater than or equal to 90% of their needs  MONITOR:    (Energy Intake, Electrolyte and renal Profile, Anthropometrics, Digestive System)  REASON FOR ASSESSMENT:    (Low BMI)    ASSESSMENT:   Pt admitted with CP, scheduled for stress test tomorrow per MD note. Pt with h/o COPD, not in exacerbation per H&P.  Past Medical History  Diagnosis Date  . COPD (chronic obstructive pulmonary disease) ([place])   . Hypertension   . Ischemic cardiomyopathy     a.   . Coronary artery disease     a. s/p multiple stenting in 2005  . History of ventricular tachycardia   . PVD (peripheral vascular disease) ([place])   . PAD (peripheral artery disease) ([place])   . HLD (hyperlipidemia)   . Chronic back pain   . MI, old   . MRSA (methicillin resistant staph aureus) culture positive     left foot wound  . CHF (congestive heart failure) ([place])   . Pneumonia   . Asthma   . Cancer [facility])      Diet Order:  Diet Heart Room service appropriate?: Yes; Fluid consistency:: Thin    Current Nutrition: Pt ate 95% of lunch today on visit including 100% of Ensure on visit. Pt reports good appetite since admission, eating dinner last night and all of [place] tray as bedtime snack.   Food/Nutrition-Related History: Pt reports very poor po intake for the past 5 days. Pt reports not eating anything for 5 days PTA secondary to pain and po not staying down.  On previous  admissions pt has reported usual po intake one meal per day usually dinner. Pt likes ensure, enjoys having on admissions.   Scheduled Medications:  . atorvastatin  40 mg Oral q1800  . carvedilol  3.125 mg Oral BID WC  . collagenase   Topical Daily  . feeding supplement (ENSURE ENLIVE)  237 mL Oral TID WC  . guaiFENesin  600 mg Oral BID  . heparin  5,000 Units Subcutaneous 3 times per day  . isosorbide mononitrate  30 mg Oral Daily  . mometasone-formoterol  2 puff Inhalation BID  . OxyCODONE  80 mg Oral Q12H  . pregabalin  75 mg Oral Daily  . ranolazine  500 mg Oral BID  . sodium chloride  3 mL Intravenous Q12H  . tiotropium  18 mcg Inhalation Daily    Electrolyte/Renal Profile and Glucose Profile:   Recent Labs Lab 05/21/15 1911  NA 144  K 4.0  CL 98*  CO2 36*  BUN 6  CREATININE 0.75  CALCIUM 10.0  GLUCOSE 101*   Protein Profile: No results for input(s): ALBUMIN in the last 168 hours.  Gastrointestinal Profile: Last BM:  05/22/2015   Nutrition-Focused Physical Exam Findings: Nutrition-Focused physical exam completed. Findings are severe fat depletion, severe muscle depletion, and no edema.    Weight Change: Pt weight loss of 8-11% in 3 months per [facility].    Skin:   (  Stage I heel pressure ulcer)  Height:   Ht Readings from Last 1 Encounters:  05/21/15 5\' 5"  (1.651 m)    Weight:   Wt Readings from Last 1 Encounters:  05/21/15 90 lb (40.824 kg)    Wt Readings from Last 10 Encounters:  05/21/15 90 lb (40.824 kg)  04/08/15 117 lb 1.6 oz (53.116 kg)  02/10/15 101 lb 1.6 oz (45.859 kg)  02/03/15 98 lb 12.8 oz (44.815 kg)  01/22/15 98 lb 11.2 oz (44.77 kg)  01/16/15 98 lb 14.4 oz (44.861 kg)  01/09/15 90 lb (40.824 kg)  12/30/14 97 lb 9.6 oz (44.271 kg)  12/16/14 89 lb 14.4 oz (40.778 kg)  11/26/14 107 lb 1.6 oz (48.58 kg)     Ideal Body Weight:   56.8kg  BMI:  Body mass index is 14.98 kg/(m^2).  Estimated Nutritional Needs:   Kcal:  BEE: 1098kcals,  TEE: (IF 1.1-1.3)(AF 1.3) 1571-1857kcals, using IBW of 56.8kg  Protein:  62-74g protein (1.1-1.3g/kg)  Fluid:  1420-1762mL of fluid (25-6mL/kg)  EDUCATION NEEDS:   No education needs identified at this time   [place], RD, LDN Pager [phone]

## 2015-05-23 NOTE — Consult Note (Signed)
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  Well known to [facility] team.  Some evidence of wound healing, noted by scarring present. Painful to patient.  Wound type:Chrinic vascular ulcer Pressure Ulcer POA: N/A Measurement: 1.6 cm x 0.7 cm x 0.2 cm Wound bed: sloughing noted.  Patient has caregiver at home.  Will begin enzymatic debridement, to be continued at home.  Please send Santyl home with patient.  Drainage (amount, consistency, odor) Minimal serosanguinous Periwound:Intact painful to touch Dressing procedure/placement/frequency:Cleanse ulcer to left forsal foot and anterior leg with NS and pat gently dry.  Apply Santyl ointment to wound bed. COver with vaseline gauze.  Secure with 4x4 gauze and kerlix.  Tape.  Change daily.  Send Santyl home with patient upon discharge.  Will not follow at this time.  Please re-consult if needed.  Domenic Moras RN BSN [place] Pager [phone]

## 2015-05-23 NOTE — Consult Note (Signed)
[facility] Card[facility] Consultation Note  Patient ID: Lynn Butler, MRN: [id number], DOB/AGE: 1945-10-16 69 y.o. Admit date: 05/21/2015   Date of Consult: 05/23/2015 Primary Physician: Juanell Fairly, MD Primary Cardiologist: None  Chief Complaint:  Chief Complaint  Patient presents with  . Chest Pain   Reason for Consult: known coronary artery disease status post multiple previous PCI and stent placement  HPI: 69 y.o. female with known coronary artery disease status post previous multiple stents in the past who is had essential hypertension chronic obstructive pulmonary disease and some peripheral vascular disease. The patient does come to the hospital with episodes of chest discomfort substernal in nature radiating into the back now with associated shortness of breath. There does not appear to be any significant exacerbation of chronic obstructive pulmonary disease and/or infection at this time. The patient has had no evidence of congestive heart failure. Patient hemodynamically has been stable. EKG has shown normal sinus rhythm with diffuse ST and T-wave changes not significantly consistent with previous or current myocardial infarction. The patient does have an elevated troponin of 0.15 but has not changed throughout this hospital course. The patient does continue to have waxing and waning episodes of chest discomfort atypical in nature and therefore we will further evaluate from the cardiac standpoint  Past Medical History  Diagnosis Date  . COPD (chronic obstructive pulmonary disease) ([place])   . Hypertension   . Ischemic cardiomyopathy     a.   . Coronary artery disease     a. s/p multiple stenting in 2005  . History of ventricular tachycardia   . PVD (peripheral vascular disease) ([place])   . PAD (peripheral artery disease) ([place])   . HLD (hyperlipidemia)   . Chronic back pain   . MI, old   . MRSA (methicillin resistant staph aureus) culture positive     left foot wound  . CHF  (congestive heart failure) ([place])   . Pneumonia   . Asthma   . Cancer [facility])       Surgical History:  Past Surgical History  Procedure Laterality Date  . Insert / replace / remove pacemaker    . Vascular bypass surgery Bilateral   . Cardiac catheterization N/A 01/14/2015    Procedure: Left Heart Cath;  Surgeon: Wellington Hampshire, MD;  Location: [place] [facility];  Service: Cardiovascular;  Laterality: N/A;  . Cardiac defibrillator placement    . Appendectomy    . Cesarean section    . Abdominal surgery       Home Meds: Prior to Admission medications   Medication Sig Start Date End Date Taking? Authorizing Provider  albuterol (PROAIR HFA) 108 (90 BASE) MCG/ACT inhaler Inhale 1-2 puffs into the lungs every 4 (four) hours as needed for wheezing or shortness of breath. 07/28/14  Yes Marijean Heath, NP  atorvastatin (LIPITOR) 40 MG tablet Take 1 tablet (40 mg total) by mouth daily at 6 PM. 01/17/15  Yes Gladstone Lighter, MD  benzonatate (TESSALON) 200 MG capsule Take 1 capsule (200 mg total) by mouth 3 (three) times daily as needed for cough. 02/11/15  Yes Vaughan Basta, MD  carvedilol (COREG) 3.125 MG tablet Take 1 tablet (3.125 mg total) by mouth 2 (two) times daily with a meal. 01/17/15  Yes Gladstone Lighter, MD  diazepam (VALIUM) 10 MG tablet Take 1 tablet (10 mg total) by mouth every 12 (twelve) hours as needed for anxiety. 02/03/15  Yes Nicholes Mango, MD  feeding supplement, ENSURE ENLIVE, (ENSURE ENLIVE) LIQD Take  237 mLs by mouth 3 (three) times daily with meals. 02/11/15  Yes Vaughan Basta, MD  guaiFENesin (MUCINEX) 600 MG 12 hr tablet Take 1 tablet (600 mg total) by mouth 2 (two) times daily. 02/11/15  Yes Vaughan Basta, MD  HYDROmorphone (DILAUDID) 4 MG tablet Take 1 tablet (4 mg total) by mouth every 6 (six) hours as needed for severe pain. 04/11/15  Yes Henreitta Leber, MD  ipratropium-albuterol (DUONEB) 0.5-2.5 (3) MG/3ML SOLN Take 3 mLs by nebulization  every 6 (six) hours as needed. 02/11/15  Yes Vaughan Basta, MD  isosorbide mononitrate (IMDUR) 30 MG 24 hr tablet Take 1 tablet (30 mg total) by mouth daily. 02/11/15  Yes Vaughan Basta, MD  loperamide (IMODIUM A-D) 2 MG tablet Take 1 tablet (2 mg total) by mouth 4 (four) times daily as needed for diarrhea or loose stools. 04/11/15  Yes Henreitta Leber, MD  mometasone-formoterol (DULERA) 100-5 MCG/ACT AERO Inhale 2 puffs into the lungs 2 (two) times daily. 04/11/15  Yes Henreitta Leber, MD  OxyCODONE (OXYCONTIN) 80 mg T12A 12 hr tablet Take 1 tablet (80 mg total) by mouth every 12 (twelve) hours. 04/11/15  Yes Henreitta Leber, MD  tiotropium (SPIRIVA) 18 MCG inhalation capsule Place 1 capsule (18 mcg total) into inhaler and inhale daily. 04/11/15  Yes Henreitta Leber, MD  zolpidem (AMBIEN) 10 MG tablet Take 1 tablet (10 mg total) by mouth at bedtime as needed for sleep. 01/17/15  Yes Gladstone Lighter, MD  LORazepam (ATIVAN) 1 MG tablet Take 1 tablet (1 mg total) by mouth every 8 (eight) hours as needed for anxiety. Patient not taking: Reported on 05/21/2015 04/11/15   Henreitta Leber, MD  predniSONE (DELTASONE) 10 MG tablet Label  & dispense according to the schedule below. 5 Pills PO for 1 day then, 4 Pills PO for 1 day, 3 Pills PO for 1 day, 2 Pills PO for 1 day, 1 Pill PO for 1 days then STOP. Patient not taking: Reported on 05/21/2015 04/11/15   Henreitta Leber, MD    Inpatient Medications:  . atorvastatin  40 mg Oral q1800  . carvedilol  3.125 mg Oral BID WC  . feeding supplement (ENSURE ENLIVE)  237 mL Oral TID WC  . guaiFENesin  600 mg Oral BID  . heparin  5,000 Units Subcutaneous 3 times per day  . isosorbide mononitrate  30 mg Oral Daily  . mometasone-formoterol  2 puff Inhalation BID  . OxyCODONE  80 mg Oral Q12H  . pregabalin  75 mg Oral Daily  . sodium chloride  3 mL Intravenous Q12H  . tiotropium  18 mcg Inhalation Daily      Allergies:  Allergies  Allergen  Reactions  . Nsaids Nausea And Vomiting and Other (See Comments)    Reaction:  GI distress and burning     Social History   Social History  . Marital Status: Widowed    Spouse Name: N/A  . Number of Children: N/A  . Years of Education: N/A   Occupational History  . Not on file.   Social History Main Topics  . Smoking status: Current Every Day Smoker -- 0.50 packs/day    Types: Cigarettes  . Smokeless tobacco: Not on file  . Alcohol Use: No  . Drug Use: No  . Sexual Activity: Not on file   Other Topics Concern  . Not on file   Social History Narrative     Family History  Problem Relation Age of  Onset  . CAD Other   . Breast cancer Mother   . Heart disease Mother   . Coronary artery disease Father      Review of Systems Positive for chest discomfort shortness of breath Negative for: General:  chills, fever, night sweats or weight changes.  Cardiovascular: PND orthopnea syncope dizziness  Dermatological skin lesions rashes Respiratory: Cough congestion Urologic: Frequent urination urination at night and hematuria Abdominal: negative for nausea, vomiting, diarrhea, bright red blood per rectum, melena, or hematemesis Neurologic: negative for visual changes, and/or hearing changes  All other systems reviewed and are otherwise negative except as noted above.  Labs:  Recent Labs  05/20/15 2030 05/22/15 0118 05/22/15 0651 05/22/15 1256  TROPONINI 0.19* 0.15* 0.15* 0.09*   Lab Results  Component Value Date   WBC 7.7 05/21/2015   HGB 14.8 05/21/2015   HCT 47.1* 05/21/2015   MCV 65.4* 05/21/2015   PLT 338 05/21/2015    Recent Labs Lab 05/21/15 1911  NA 144  K 4.0  CL 98*  CO2 36*  BUN 6  CREATININE 0.75  CALCIUM 10.0  GLUCOSE 101*   Lab Results  Component Value Date   CHOL 176 01/14/2015   HDL 58 01/14/2015   LDLCALC 104* 01/14/2015   TRIG 72 01/14/2015   No results found for: DDIMER  Radiology/Studies:  Dg Chest 2 View  05/21/2015   CLINICAL DATA:  69 year old female with acute chest pain, shortness of breath and cough for 1 day. EXAM: CHEST  2 VIEW COMPARISON:  04/07/2015 and prior chest radiographs FINDINGS: Cardiomegaly and left-sided AICD again noted. Mild right basilar opacity may represent atelectasis, mild airspace disease or residual opacity as previously seen. There is no evidence of pleural effusion, pneumothorax, pulmonary edema or pulmonary mass. No acute bony abnormalities are identified. IMPRESSION: Mild right basilar opacity which may represent atelectasis, mild airspace disease/ pneumonia or residual opacity as previously identified. Cardiomegaly. Electronically Signed   By: Margarette Canada M.D.   On: 05/21/2015 19:53   Ct Angio Chest Pe W/cm &/or Wo Cm  05/21/2015  CLINICAL DATA:  Acute chest pain, shortness of breath. EXAM: CT ANGIOGRAPHY CHEST WITH CONTRAST TECHNIQUE: Multidetector CT imaging of the chest was performed using the standard protocol during bolus administration of intravenous contrast. Multiplanar CT image reconstructions and MIPs were obtained to evaluate the vascular anatomy. CONTRAST:  69mL OMNIPAQUE IOHEXOL 350 MG/ML SOLN COMPARISON:  Radiograph of same day. FINDINGS: No pneumothorax or pleural effusion is noted. Mild right posterior basilar subsegmental atelectasis is noted. 14 x 10 mm spiculated density is noted laterally in the right upper lobe with pleural tail concerning for malignancy. 11 x 10 mm density is noted anteriorly in the right lung apex concerning for malignancy. Multiple smaller other nodules are noted inferiorly in the right middle lobe which may represent metastatic disease. There is no evidence of pulmonary embolus. Atherosclerosis of thoracic aorta is noted without aneurysm or dissection. Visualized portion of upper abdomen is unremarkable. Mild right hilar adenopathy is noted, which measures 24 x 15 mm, is concerning for metastatic disease. Review of the MIP images confirms the above  findings. IMPRESSION: No evidence of pulmonary embolus. 14 mm spiculated density noted laterally in the right upper lobe, as well as 11 mm density noted anteriorly in right lung apex. These lesions are concerning for malignancy. Also noted are multiple smaller other nodules inferiorly in the right middle lobe which may represent metastatic disease. Probable mild right hilar adenopathy is noted as well. PET  scan is recommended for further evaluation. Mild right posterior basilar subsegmental atelectasis or pneumonia is noted. Electronically Signed   By: Marijo Conception, M.D.   On: 05/21/2015 21:00    EKG: Normal sinus rhythm with nonspecific ST and T-wave changes with inversion of T waves in the lateral leads  Weights: Filed Weights   05/21/15 1857  Weight: 90 lb (40.824 kg)     Physical Exam: Blood pressure 95/57, pulse 69, temperature 97.7 F (36.5 C), temperature source Oral, resp. rate 16, height 5\' 5"  (1.651 m), weight 90 lb (40.824 kg), SpO2 98 %. Body mass index is 14.98 kg/(m^2). General: Well developed, well nourished, in no acute distress. Head eyes ears nose throat: Normocephalic, atraumatic, sclera non-icteric, no xanthomas, nares are without discharge. No apparent thyromegaly and/or mass  Lungs: Normal respiratory effort. Diffuse wheezes, no rales, no rhonchi.  Heart: RRR with normal S1 S2. no murmur gallop, no rub, PMI is normal size and placement, carotid upstroke normal without bruit, jugular venous pressure is normal Abdomen: Soft, non-tender, non-distended with normoactive bowel sounds. No hepatomegaly. No rebound/guarding. No obvious abdominal masses. Abdominal aorta is normal size without bruit Extremities: Trace edema. no cyanosis, no clubbing, positive ulcers  Peripheral : 2+ bilateral upper extremity pulses, 2+ bilateral femoral pulses, 2+ bilateral dorsal pedal pulse Neuro: Alert and oriented. No facial asymmetry. No focal deficit. Moves all extremities  spontaneously. Musculoskeletal: Normal muscle tone without kyphosis Psych:  Responds to questions appropriately with a normal affect.    Assessment: 69 year old female with known coronary artery disease essential hypertension mixed hyperlipidemia with atypical chest discomfort with slight elevation of troponin and EKG changes needing further evaluation and treatment options  Plan: 1. Continue medication management for further risk reduction of cardiovascular disease including carvedilol and isosorbide 2. Continue Ranexa for other episodes of chest discomfort and coronary artery disease 3. High intensity cholesterol therapy with atorvastatin 4. Type platelet medication management for further risk reduction cardiovascular event 5. Begin ambulation and follow for worsening symptoms and further intervention including stress test to assess for myocardial ischemia.  Ted Mcalpine M.D. [facility] 05/23/2015, 7:15 AM

## 2015-05-23 NOTE — Progress Notes (Signed)
[place] [facility]                                                                                                                                                                                            Patient Demographics   Lynn Butler, is a 69 y.o. female, DOB - 07/08/1946, ULA:[id number]  Admit date - 05/21/2015   Admitting Physician Lytle Butte, MD  Outpatient Primary MD for the patient is Juanell Fairly, MD   Subjective: Patient was seen by cardiology earlier and scheduled for stress test, she states that nobody explained her about the stress test andrefused to do it today, agreeable for tomorrow morning     Review of Systems:   CONSTITUTIONAL: No documented fever. No fatigue, weakness. No weight gain, no weight loss.  EYES: No blurry or double vision.  ENT: No tinnitus. No postnasal drip. No redness of the oropharynx.  RESPIRATORY: No cough, no wheeze, no hemoptysis. No dyspnea.  CARDIOVASCULAR: Positive chest pain. No orthopnea. No palpitations. No syncope.  GASTROINTESTINAL: No nausea, no vomiting or diarrhea. No abdominal pain. No melena or hematochezia.  GENITOURINARY: No dysuria or hematuria.  ENDOCRINE: No polyuria or nocturia. No heat or cold intolerance.  HEMATOLOGY: No anemia. No bruising. No bleeding.  INTEGUMENTARY: No rashes. No lesions.  MUSCULOSKELETAL: No arthritis. No swelling. No gout. Also her in the legs complains of pain NEUROLOGIC: No numbness, tingling, or ataxia. No seizure-type activity.  PSYCHIATRIC: No anxiety. No insomnia. No ADD.    Vitals:   Filed Vitals:   05/23/15 0440 05/23/15 0821 05/23/15 1012 05/23/15 1237  BP: 95/57 118/61 103/57 92/45  Pulse: 69 63 72 60  Temp: 97.7 F (36.5 C)   97.8 F (36.6 C)  TempSrc: Oral   Oral  Resp: 16   16  Height:      Weight:      SpO2: 98%   99%    Wt Readings from Last 3 Encounters:  05/21/15 40.824 kg (90 lb)  04/08/15 53.116 kg (117 lb 1.6 oz)   02/10/15 45.859 kg (101 lb 1.6 oz)     Intake/Output Summary (Last 24 hours) at 05/23/15 1300 Last data filed at 05/23/15 1016  Gross per 24 hour  Intake 431.33 ml  Output   2650 ml  Net -2218.67 ml    Physical Exam:   GENERAL: Pleasant-appearing in no apparent distress.  HEAD, EYES, EARS, NOSE AND THROAT: Atraumatic, normocephalic. Extraocular muscles are intact. Pupils equal and reactive to light. Sclerae anicteric. No conjunctival injection. No oro-pharyngeal erythema.  NECK: Supple. There is no jugular venous distention. No  bruits, no lymphadenopathy, no thyromegaly.  HEART: Regular rate and rhythm,. No murmurs, no rubs, no clicks.  LUNGS: Clear to auscultation bilaterally. No rales or rhonchi. No wheezes.  ABDOMEN: Soft, flat, nontender, nondistended. Has good bowel sounds. No hepatosplenomegaly appreciated.  EXTREMITIES: No evidence of any cyanosis, clubbing, or peripheral edema.  +2 pedal and radial pulses bilaterally.  NEUROLOGIC: The patient is alert, awake, and oriented x3 with no focal motor or sensory deficits appreciated bilaterally.  SKIN: Moist and warm  has lower extremity ulcers which are dressed , large skin ulceration on the forehead Psych: Not anxious, depressed LN: No inguinal LN enlargement    Antibiotics   Anti-infectives    Start     Dose/Rate Route Frequency Ordered Stop   05/21/15 2215  piperacillin-tazobactam (ZOSYN) IVPB 3.375 g     3.375 g 12.5 mL/hr over 240 Minutes Intravenous  Once 05/21/15 2207 05/22/15 0042      Medications   Scheduled Meds: . atorvastatin  40 mg Oral q1800  . carvedilol  3.125 mg Oral BID WC  . collagenase   Topical Daily  . feeding supplement (ENSURE ENLIVE)  237 mL Oral TID WC  . guaiFENesin  600 mg Oral BID  . heparin  5,000 Units Subcutaneous 3 times per day  . isosorbide mononitrate  30 mg Oral Daily  . mometasone-formoterol  2 puff Inhalation BID  . OxyCODONE  80 mg Oral Q12H  . pregabalin  75 mg Oral Daily   . sodium chloride  3 mL Intravenous Q12H  . tiotropium  18 mcg Inhalation Daily   Continuous Infusions:  PRN Meds:.acetaminophen **OR** acetaminophen, albuterol, benzonatate, diazepam, HYDROmorphone, ipratropium-albuterol, loperamide, nitroGLYCERIN, ondansetron **OR** ondansetron (ZOFRAN) IV, zolpidem   Data Review:   Micro Results Recent Results (from the past 240 hour(s))  Culture, blood (routine x 2)     Status: None (Preliminary result)   Collection Time: 05/21/15 10:49 PM  Result Value Ref Range Status   Specimen Description BLOOD RIGHT HAND  Final   Special Requests BOTTLES DRAWN AEROBIC AND ANAEROBIC 4CC  Final   Culture NO GROWTH < 12 HOURS  Final   Report Status PENDING  Incomplete  Culture, blood (routine x 2)     Status: None (Preliminary result)   Collection Time: 05/21/15 11:00 PM  Result Value Ref Range Status   Specimen Description BLOOD LEFT ASSIST CONTROL  Final   Special Requests BOTTLES DRAWN AEROBIC AND ANAEROBIC 4CC  Final   Culture NO GROWTH < 12 HOURS  Final   Report Status PENDING  Incomplete  MRSA PCR Screening     Status: None   Collection Time: 05/22/15  1:46 AM  Result Value Ref Range Status   MRSA by PCR NEGATIVE NEGATIVE Final    Comment:        The GeneXpert MRSA Assay (FDA approved for NASAL specimens only), is one component of a comprehensive MRSA colonization surveillance program. It is not intended to diagnose MRSA infection nor to guide or monitor treatment for MRSA infections.     Radiology Reports Dg Chest 2 View  05/21/2015  CLINICAL DATA:  69 year old female with acute chest pain, shortness of breath and cough for 1 day. EXAM: CHEST  2 VIEW COMPARISON:  04/07/2015 and prior chest radiographs FINDINGS: Cardiomegaly and left-sided AICD again noted. Mild right basilar opacity may represent atelectasis, mild airspace disease or residual opacity as previously seen. There is no evidence of pleural effusion, pneumothorax, pulmonary edema  or pulmonary mass. No acute  bony abnormalities are identified. IMPRESSION: Mild right basilar opacity which may represent atelectasis, mild airspace disease/ pneumonia or residual opacity as previously identified. Cardiomegaly. Electronically Signed   By: Margarette Canada M.D.   On: 05/21/2015 19:53   Ct Angio Chest Pe W/cm &/or Wo Cm  05/21/2015  CLINICAL DATA:  Acute chest pain, shortness of breath. EXAM: CT ANGIOGRAPHY CHEST WITH CONTRAST TECHNIQUE: Multidetector CT imaging of the chest was performed using the standard protocol during bolus administration of intravenous contrast. Multiplanar CT image reconstructions and MIPs were obtained to evaluate the vascular anatomy. CONTRAST:  80mL OMNIPAQUE IOHEXOL 350 MG/ML SOLN COMPARISON:  Radiograph of same day. FINDINGS: No pneumothorax or pleural effusion is noted. Mild right posterior basilar subsegmental atelectasis is noted. 14 x 10 mm spiculated density is noted laterally in the right upper lobe with pleural tail concerning for malignancy. 11 x 10 mm density is noted anteriorly in the right lung apex concerning for malignancy. Multiple smaller other nodules are noted inferiorly in the right middle lobe which may represent metastatic disease. There is no evidence of pulmonary embolus. Atherosclerosis of thoracic aorta is noted without aneurysm or dissection. Visualized portion of upper abdomen is unremarkable. Mild right hilar adenopathy is noted, which measures 24 x 15 mm, is concerning for metastatic disease. Review of the MIP images confirms the above findings. IMPRESSION: No evidence of pulmonary embolus. 14 mm spiculated density noted laterally in the right upper lobe, as well as 11 mm density noted anteriorly in right lung apex. These lesions are concerning for malignancy. Also noted are multiple smaller other nodules inferiorly in the right middle lobe which may represent metastatic disease. Probable mild right hilar adenopathy is noted as well. PET scan is  recommended for further evaluation. Mild right posterior basilar subsegmental atelectasis or pneumonia is noted. Electronically Signed   By: Marijo Conception, M.D.   On: 05/21/2015 21:00     CBC  Recent Labs Lab 05/21/15 1911  WBC 7.7  HGB 14.8  HCT 47.1*  PLT 338  MCV 65.4*  MCH 20.5*  MCHC 31.3*  RDW 19.2*    Chemistries   Recent Labs Lab 05/21/15 1911  NA 144  K 4.0  CL 98*  CO2 36*  GLUCOSE 101*  BUN 6  CREATININE 0.75  CALCIUM 10.0   ------------------------------------------------------------------------------------------------------------------ estimated creatinine clearance is 43.4 mL/min (by C-G formula based on Cr of 0.75). ------------------------------------------------------------------------------------------------------------------ No results for input(s): HGBA1C in the last 72 hours. ------------------------------------------------------------------------------------------------------------------ No results for input(s): CHOL, HDL, LDLCALC, TRIG, CHOLHDL, LDLDIRECT in the last 72 hours. ------------------------------------------------------------------------------------------------------------------ No results for input(s): TSH, T4TOTAL, T3FREE, THYROIDAB in the last 72 hours.  Invalid input(s): FREET3 ------------------------------------------------------------------------------------------------------------------ No results for input(s): VITAMINB12, FOLATE, FERRITIN, TIBC, IRON, RETICCTPCT in the last 72 hours.  Coagulation profile  Recent Labs Lab 05/21/15 1911  INR 1.10    No results for input(s): DDIMER in the last 72 hours.  Cardiac Enzymes  Recent Labs Lab 05/22/15 0118 05/22/15 0651 05/22/15 1256  TROPONINI 0.15* 0.15* 0.09*   ------------------------------------------------------------------------------------------------------------------ Invalid input(s): POCBNP    Assessment & Plan   69 year old Caucasian female  chronically ill history of coronary disease status post multiple stenting who is presenting with chest pain.  1.Chest pain, central: . History of stents a few months prior. Apparently does not follow up with cardiology. Continue imdure, allergic to aspirin and  Add Ranexa to her current regimen reschedule stress test for tomorrow morning,   2. COPD not in acute exacerbation: Continue with home  medications including albuterol, DuoNeb treatments, supplemental oxygen, Spiriva 3. Hyperlipidemia unspecified: Lipitor 4.  chronic lower extremity wound continue current care outpatient follow-up with wound care 5. Chronic pain continue Dilaudid and OxyContin add Lyrica to her current regimen 6. 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

## 2015-05-23 NOTE — Care Management (Signed)
Patient admitted under observation for chest pain.   Slight bump in troponin.  Cardiology consult in progress.  Patient is currently followed by [place].  Agency is aware of admission.  have reached out to determine what services are being provided

## 2015-05-24 ENCOUNTER — Encounter: Payer: Self-pay | Admitting: Radiology

## 2015-05-24 ENCOUNTER — Ambulatory Visit: Payer: Medicare Other

## 2015-05-24 DIAGNOSIS — R079 Chest pain, unspecified: Secondary | ICD-10-CM | POA: Diagnosis not present

## 2015-05-24 LAB — CBC
HCT: 32.6 % — ABNORMAL LOW (ref 35.0–47.0)
Hemoglobin: 9.5 g/dL — ABNORMAL LOW (ref 12.0–16.0)
MCH: 19.5 pg — ABNORMAL LOW (ref 26.0–34.0)
MCHC: 29.2 g/dL — ABNORMAL LOW (ref 32.0–36.0)
MCV: 66.8 fL — ABNORMAL LOW (ref 80.0–100.0)
PLATELETS: 188 10*3/uL (ref 150–440)
RBC: 4.88 MIL/uL (ref 3.80–5.20)
RDW: 18.8 % — ABNORMAL HIGH (ref 11.5–14.5)
WBC: 5.9 10*3/uL (ref 3.6–11.0)

## 2015-05-24 MED ORDER — COLLAGENASE 250 UNIT/GM EX OINT: TOPICAL_OINTMENT | Freq: Every day | CUTANEOUS | Status: DC

## 2015-05-24 MED ORDER — DIAZEPAM 10 MG PO TABS: 10.0000 mg | ORAL_TABLET | Freq: Two times a day (BID) | ORAL | Status: DC | PRN

## 2015-05-24 MED ORDER — OXYCODONE HCL ER 80 MG PO T12A: 80.0000 mg | EXTENDED_RELEASE_TABLET | Freq: Two times a day (BID) | ORAL | Status: DC

## 2015-05-24 MED ORDER — TECHNETIUM TC 99M SESTAMIBI - CARDIOLITE
13.5600 | Freq: Once | INTRAVENOUS | Status: AC | PRN
  Administered 2015-05-24: 08:00:00 13.56 via INTRAVENOUS

## 2015-05-24 MED ORDER — NITROGLYCERIN 0.4 MG SL SUBL: 0.4000 mg | SUBLINGUAL_TABLET | SUBLINGUAL | Status: DC | PRN

## 2015-05-24 MED ORDER — TECHNETIUM TC 99M SESTAMIBI - CARDIOLITE
31.2000 | Freq: Once | INTRAVENOUS | Status: AC | PRN
  Administered 2015-05-24: 31.2 via INTRAVENOUS

## 2015-05-24 MED ORDER — HYDROMORPHONE HCL 4 MG PO TABS: 4.0000 mg | ORAL_TABLET | Freq: Four times a day (QID) | ORAL | Status: DC | PRN

## 2015-05-24 MED ORDER — RANOLAZINE ER 500 MG PO TB12: 500.0000 mg | ORAL_TABLET | Freq: Two times a day (BID) | ORAL | Status: DC

## 2015-05-24 MED ORDER — REGADENOSON 0.4 MG/5ML IV SOLN
0.4000 mg | Freq: Once | INTRAVENOUS | Status: AC
  Administered 2015-05-24: 0.4 mg via INTRAVENOUS

## 2015-05-24 NOTE — Progress Notes (Signed)
Patient has rested quietly tonight. No complaints of pain after receiving scheduled pain medicine and no signs of discomfort or distress noted. Nursing staff will continue to monitor. Earleen Reaper, RN

## 2015-05-24 NOTE — Progress Notes (Signed)
[facility] Cardiology [facility] Encounter Note  Patient: Lynn Butler / Admit Date: 05/21/2015 / Date of Encounter: 05/24/2015, 2:33 PM   Subjective: Intermittent cp but no concwerns of ACS Stress test results pending  Review of Systems: Positive for:cp sob Negative for: Vision change, hearing change, syncope, dizziness, nausea, vomiting,diarrhea, bloody stool, stomach pain, cough, congestion, diaphoresis, urinary frequency, urinary pain,skin lesions, skin rashes Others previously listed  Objective: Telemetry: nsr Physical Exam: Blood pressure 95/38, pulse 65, temperature 98.3 F (36.8 C), temperature source Oral, resp. rate 16, height 5\' 5"  (1.651 m), weight 90 lb (40.824 kg), SpO2 99 %. Body mass index is 14.98 kg/(m^2). General: Well developed, well nourished, in no acute distress. Head: Normocephalic, atraumatic, sclera non-icteric, no xanthomas, nares are without discharge. Neck: No apparent masses Lungs: Normal respirations with few wheezes, no rhonchi, no rales , no crackles   Heart: Regular rate and rhythm, normal S1 S2, no murmur, no rub, no gallop, PMI is normal size and placement, carotid upstroke normal without bruit, jugular venous pressure normal Abdomen: Soft, non-tender, non-distended with normoactive bowel sounds. No hepatosplenomegaly. Abdominal aorta is normal size without bruit Extremities: trace edema, no clubbing, no cyanosis, no ulcers,  Peripheral: 2+ radial, 2+ femoral, 2+ dorsal pedal pulses Neuro: Alert and oriented. Moves all extremities spontaneously.     Intake/Output Summary (Last 24 hours) at 05/24/15 1433 Last data filed at 05/24/15 1235  Gross per 24 hour  Intake    240 ml  Output   3050 ml  Net  -2810 ml    Inpatient Medications:  . atorvastatin  40 mg Oral q1800  . carvedilol  3.125 mg Oral BID WC  . collagenase   Topical Daily  . feeding supplement (ENSURE ENLIVE)  237 mL Oral TID WC  . guaiFENesin  600 mg Oral BID  . heparin   5,000 Units Subcutaneous 3 times per day  . isosorbide mononitrate  30 mg Oral Daily  . mometasone-formoterol  2 puff Inhalation BID  . OxyCODONE  80 mg Oral Q12H  . pregabalin  75 mg Oral Daily  . ranolazine  500 mg Oral BID  . sodium chloride  3 mL Intravenous Q12H  . tiotropium  18 mcg Inhalation Daily   Infusions:    Labs:  Recent Labs  05/21/15 1911  NA 144  K 4.0  CL 98*  CO2 36*  GLUCOSE 101*  BUN 6  CREATININE 0.75  CALCIUM 10.0   No results for input(s): AST, ALT, ALKPHOS, BILITOT, PROT, ALBUMIN in the last 72 hours.  Recent Labs  05/21/15 1911 05/24/15 0437  WBC 7.7 5.9  HGB 14.8 9.5*  HCT 47.1* 32.6*  MCV 65.4* 66.8*  PLT 338 188    Recent Labs  05/22/15 0118 05/22/15 0651 05/22/15 1256  TROPONINI 0.15* 0.15* 0.09*   Invalid input(s): POCBNP No results for input(s): HGBA1C in the last 72 hours.   Weights: Filed Weights   05/21/15 1857  Weight: 90 lb (40.824 kg)     Radiology/Studies:  Dg Chest 2 View  05/21/2015  CLINICAL DATA:  69 year old female with acute chest pain, shortness of breath and cough for 1 day. EXAM: CHEST  2 VIEW COMPARISON:  04/07/2015 and prior chest radiographs FINDINGS: Cardiomegaly and left-sided AICD again noted. Mild right basilar opacity may represent atelectasis, mild airspace disease or residual opacity as previously seen. There is no evidence of pleural effusion, pneumothorax, pulmonary edema or pulmonary mass. No acute bony abnormalities are identified. IMPRESSION: Mild right basilar opacity  which may represent atelectasis, mild airspace disease/ pneumonia or residual opacity as previously identified. Cardiomegaly. Electronically Signed   By: Margarette Canada M.D.   On: 05/21/2015 19:53   Ct Angio Chest Pe W/cm &/or Wo Cm  05/21/2015  CLINICAL DATA:  Acute chest pain, shortness of breath. EXAM: CT ANGIOGRAPHY CHEST WITH CONTRAST TECHNIQUE: Multidetector CT imaging of the chest was performed using the standard protocol  during bolus administration of intravenous contrast. Multiplanar CT image reconstructions and MIPs were obtained to evaluate the vascular anatomy. CONTRAST:  92mL OMNIPAQUE IOHEXOL 350 MG/ML SOLN COMPARISON:  Radiograph of same day. FINDINGS: No pneumothorax or pleural effusion is noted. Mild right posterior basilar subsegmental atelectasis is noted. 14 x 10 mm spiculated density is noted laterally in the right upper lobe with pleural tail concerning for malignancy. 11 x 10 mm density is noted anteriorly in the right lung apex concerning for malignancy. Multiple smaller other nodules are noted inferiorly in the right middle lobe which may represent metastatic disease. There is no evidence of pulmonary embolus. Atherosclerosis of thoracic aorta is noted without aneurysm or dissection. Visualized portion of upper abdomen is unremarkable. Mild right hilar adenopathy is noted, which measures 24 x 15 mm, is concerning for metastatic disease. Review of the MIP images confirms the above findings. IMPRESSION: No evidence of pulmonary embolus. 14 mm spiculated density noted laterally in the right upper lobe, as well as 11 mm density noted anteriorly in right lung apex. These lesions are concerning for malignancy. Also noted are multiple smaller other nodules inferiorly in the right middle lobe which may represent metastatic disease. Probable mild right hilar adenopathy is noted as well. PET scan is recommended for further evaluation. Mild right posterior basilar subsegmental atelectasis or pneumonia is noted. Electronically Signed   By: Marijo Conception, M.D.   On: 05/21/2015 21:00     Assessment and Recommendation  69 y.o. female with known cad sp previous mi and copd and now cp anf no current evidence of ACS despite minimal elevation of troponin -continue medical mgt of cad risk factors including htn and lipids -imdur and ranexa for cp  -no additonal cardaic diagnsoitcs at this time  -ok for dc to home from cardiac  standpoint unless large perfusion defect by stress test -continue medical mgt and ambulation with adjustments as oupt with fu next week  Signed, Serafina Royals M.D. FACC

## 2015-05-24 NOTE — Progress Notes (Addendum)
A & O. Stress test neg. Ambulated around the room. Room air. Portable oxygen tank brought to the hospital. IV and tele removed. Discharge instructions given to pt. Prescriptions given to pt. Daughter to take home. Pt has no further concerns at this time.

## 2015-05-24 NOTE — Progress Notes (Signed)
MD Posey Pronto was notified of stress test results. OK to d/c home.

## 2015-05-25 LAB — NM MYOCAR MULTI W/SPECT W/WALL MOTION / EF
CHL CUP NUCLEAR SDS: 2
CHL CUP NUCLEAR SRS: 23
CHL CUP NUCLEAR SSS: 20
LV sys vol: 189 mL
LVDIAVOL: 272 mL
Peak HR: 80 {beats}/min
Rest HR: 59 {beats}/min
TID: 1.13

## 2015-05-25 NOTE — Care Management (Signed)
Late Entry from 10/25.  Lynn Butler was informed of discharge and orders to resume home health SN and PT.

## 2015-05-25 NOTE — Discharge Summary (Signed)
Lynn Butler, 69 y.o., DOB 10-31-1945, MRN [id number]. Admission date: 05/21/2015 Discharge Date 05/25/2015 Primary MD Juanell Fairly, MD Admitting Physician Lytle Butte, MD  Admission Diagnosis  Community acquired pneumonia [J18.9] Troponin I above reference range [R79.89] Right-sided chest pain [R07.9] Chronic obstructive pulmonary disease, unspecified COPD type [facility]) [J44.9]  Discharge Diagnosis   Principal Problem:   Chest pain, central  coronary artery disease severe medical management recommended Chronic pain syndrome COPD Hypertension Ischemic cardiomyopathy History of ventricular tachycardia Peripheral vascular disease Peripheral arterial disease Chronic back pain History of MI 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 female with a known history of coronary artery disease status post PCI and stent placement, AICD placement, COPD oxygen dependent, essential hypertension presented to the emergency room with chest pain. Patient actually had a cardiac catheter a few months prior for chest pain and had 2 stents placed she does have severe coronary artery disease. She presented to the ED with the symptoms and was noted to have elevated troponin and therefore she was admitted to the hospital. Her troponin remained elevated without evidence of acute MI. She was seen by cardiology they performed a stress test that did not feel that another cardiac catheter was warranted. Recommended medical management of her symptoms. Patient does not follow-up with cardiology. She needs a follow-up with them.         Consults  cardiology  Significant Tests:  See full reports for all details    Dg Chest 2 View  05/21/2015  CLINICAL DATA:  69 year old female with acute chest pain, shortness of breath and cough for 1 day. EXAM: CHEST  2 VIEW COMPARISON:   04/07/2015 and prior chest radiographs FINDINGS: Cardiomegaly and left-sided AICD again noted. Mild right basilar opacity may represent atelectasis, mild airspace disease or residual opacity as previously seen. There is no evidence of pleural effusion, pneumothorax, pulmonary edema or pulmonary mass. No acute bony abnormalities are identified. IMPRESSION: Mild right basilar opacity which may represent atelectasis, mild airspace disease/ pneumonia or residual opacity as previously identified. Cardiomegaly. Electronically Signed   By: Margarette Canada M.D.   On: 05/21/2015 19:53   Ct Angio Chest Pe W/cm &/or Wo Cm  05/21/2015  CLINICAL DATA:  Acute chest pain, shortness of breath. EXAM: CT ANGIOGRAPHY CHEST WITH CONTRAST TECHNIQUE: Multidetector CT imaging of the chest was performed using the standard protocol during bolus administration of intravenous contrast. Multiplanar CT image reconstructions and MIPs were obtained to evaluate the vascular anatomy. CONTRAST:  8mL OMNIPAQUE IOHEXOL 350 MG/ML SOLN COMPARISON:  Radiograph of same day. FINDINGS: No pneumothorax or pleural effusion is noted. Mild right posterior basilar subsegmental atelectasis is noted. 14 x 10 mm spiculated density is noted laterally in the right upper lobe with pleural tail concerning for malignancy. 11 x 10 mm density is noted anteriorly in the right lung apex concerning for malignancy. Multiple smaller other nodules are noted inferiorly in the right middle lobe which may represent metastatic disease. There is no evidence of pulmonary embolus. Atherosclerosis of thoracic aorta is noted without aneurysm or dissection. Visualized portion of upper abdomen is unremarkable. Mild right hilar adenopathy is noted, which measures 24 x 15 mm, is concerning for metastatic disease. Review of the MIP images confirms the above findings. IMPRESSION: No evidence of pulmonary embolus. 14 mm spiculated density noted laterally in the  right upper lobe, as well as 11  mm density noted anteriorly in right lung apex. These lesions are concerning for malignancy. Also noted are multiple smaller other nodules inferiorly in the right middle lobe which may represent metastatic disease. Probable mild right hilar adenopathy is noted as well. PET scan is recommended for further evaluation. Mild right posterior basilar subsegmental atelectasis or pneumonia is noted. Electronically Signed   By: Marijo Conception, M.D.   On: 05/21/2015 21:00   Nm Myocar Multi W/spect W/wall Motion / Ef  05/25/2015   T wave inversion was noted during stress in the II, III, aVF, V4, V5 and V6 leads.  Findings consistent with prior myocardial infarction.  This is an intermediate risk study.  The left ventricular ejection fraction is severely decreased (<30%).        Today   Subjective:   Lynn Butler feels better denies any chest pain has chronic leg pain  Objective:   Blood pressure 95/38, pulse 65, temperature 98.3 F (36.8 C), temperature source Oral, resp. rate 16, height 5\' 5"  (1.651 m), weight 40.824 kg (90 lb), SpO2 99 %.  .  Intake/Output Summary (Last 24 hours) at 05/25/15 1107 Last data filed at 05/24/15 1235  Gross per 24 hour  Intake      0 ml  Output    700 ml  Net   -700 ml    Exam VITAL SIGNS: Blood pressure 95/38, pulse 65, temperature 98.3 F (36.8 C), temperature source Oral, resp. rate 16, height 5\' 5"  (1.651 m), weight 40.824 kg (90 lb), SpO2 99 %.  GENERAL:  69 y.o.-year-old patient lying in the bed with no acute distress.  EYES: Pupils equal, round, reactive to light and accommodation. No scleral icterus. Extraocular muscles intact.  HEENT: Head atraumatic, normocephalic. Oropharynx and nasopharynx clear.  NECK:  Supple, no jugular venous distention. No thyroid enlargement, no tenderness.  LUNGS: Normal breath sounds bilaterally, no wheezing, rales,rhonchi or crepitation. No use of accessory muscles of respiration.  CARDIOVASCULAR: S1, S2 normal. No  murmurs, rubs, or gallops.  ABDOMEN: Soft, nontender, nondistended. Bowel sounds present. No organomegaly or mass.  EXTREMITIES: Has dressing in both of her lower extremities NEUROLOGIC: Cranial nerves II through XII are intact. Muscle strength 5/5 in all extremities. Sensation intact. Gait not checked.  PSYCHIATRIC: The patient is alert and oriented x 3.  SKIN: Large lesion on her forehead  Data Review     CBC w Diff: Lab Results  Component Value Date   WBC 5.9 05/24/2015   WBC 6.9 11/22/2014   HGB 9.5* 05/24/2015   HGB 10.3* 11/22/2014   HCT 32.6* 05/24/2015   HCT 32.9* 11/22/2014   PLT 188 05/24/2015   PLT 189 11/26/2014   LYMPHOPCT 7 04/07/2015   LYMPHOPCT 20.8 11/22/2014   MONOPCT 9 04/07/2015   MONOPCT 11.4 11/22/2014   EOSPCT 0 04/07/2015   EOSPCT 0.3 11/22/2014   BASOPCT 0 04/07/2015   BASOPCT 0.5 11/22/2014   CMP: Lab Results  Component Value Date   NA 144 05/21/2015   NA 139 11/25/2014   K 4.0 05/21/2015   K 3.8 11/25/2014   CL 98* 05/21/2015   CL 103 11/25/2014   CO2 36* 05/21/2015   CO2 31 11/25/2014   BUN 6 05/21/2015   BUN 25* 11/25/2014   CREATININE 0.75 05/21/2015   CREATININE 0.66 11/25/2014   PROT 6.5 04/07/2015   PROT 7.1 11/15/2014   ALBUMIN 3.0* 04/07/2015   ALBUMIN 3.3* 11/15/2014   BILITOT  0.9 04/07/2015   BILITOT 0.6 11/15/2014   ALKPHOS 109 04/07/2015   ALKPHOS 109 11/15/2014   AST 38 04/07/2015   AST 23 11/15/2014   ALT 24 04/07/2015   ALT 14 11/15/2014  .  Micro Results Recent Results (from the past 240 hour(s))  Culture, blood (routine x 2)     Status: None (Preliminary result)   Collection Time: 05/21/15 10:49 PM  Result Value Ref Range Status   Specimen Description BLOOD RIGHT HAND  Final   Special Requests BOTTLES DRAWN AEROBIC AND ANAEROBIC 4CC  Final   Culture NO GROWTH 3 DAYS  Final   Report Status PENDING  Incomplete  Culture, blood (routine x 2)     Status: None (Preliminary result)   Collection Time: 05/21/15  11:00 PM  Result Value Ref Range Status   Specimen Description BLOOD LEFT ASSIST CONTROL  Final   Special Requests BOTTLES DRAWN AEROBIC AND ANAEROBIC 4CC  Final   Culture NO GROWTH 3 DAYS  Final   Report Status PENDING  Incomplete  MRSA PCR Screening     Status: None   Collection Time: 05/22/15  1:46 AM  Result Value Ref Range Status   MRSA by PCR NEGATIVE NEGATIVE Final    Comment:        The GeneXpert MRSA Assay (FDA approved for NASAL specimens only), is one component of a comprehensive MRSA colonization surveillance program. It is not intended to diagnose MRSA infection nor to guide or monitor treatment for MRSA infections.            Follow-up Information    Follow up with Juanell Fairly, MD. Go on 05/31/2015.   Specialty:  Pediatrics   Why:  Time: 1:00 p.m.   Contact information:   [place] [address] [phone]       Follow up with Corey Skains, MD. Go on 06/08/2015.   Specialty:  Internal Medicine   Why:  Time: 11:15 a.m.   Contact information:   [facility] [place] [address] [phone]       Discharge Medications     Medication List    TAKE these medications        albuterol 108 (90 BASE) MCG/ACT inhaler  Commonly known as:  PROAIR HFA  Inhale 1-2 puffs into the lungs every 4 (four) hours as needed for wheezing or shortness of breath.     atorvastatin 40 MG tablet  Commonly known as:  LIPITOR  Take 1 tablet (40 mg total) by mouth daily at 6 PM.     benzonatate 200 MG capsule  Commonly known as:  TESSALON  Take 1 capsule (200 mg total) by mouth 3 (three) times daily as needed for cough.     carvedilol 3.125 MG tablet  Commonly known as:  COREG  Take 1 tablet (3.125 mg total) by mouth 2 (two) times daily with a meal.     collagenase ointment  Commonly known as:  SANTYL  Apply topically daily.     diazepam 10 MG tablet  Commonly known as:   VALIUM  Take 1 tablet (10 mg total) by mouth every 12 (twelve) hours as needed for anxiety.     feeding supplement (ENSURE ENLIVE) Liqd  Take 237 mLs by mouth 3 (three) times daily with meals.     guaiFENesin 600 MG 12 hr tablet  Commonly known as:  MUCINEX  Take 1 tablet (600 mg total) by mouth 2 (  two) times daily.     HYDROmorphone 4 MG tablet  Commonly known as:  DILAUDID  Take 1 tablet (4 mg total) by mouth every 6 (six) hours as needed for severe pain.     ipratropium-albuterol 0.5-2.5 (3) MG/3ML Soln  Commonly known as:  DUONEB  Take 3 mLs by nebulization every 6 (six) hours as needed.     isosorbide mononitrate 30 MG 24 hr tablet  Commonly known as:  IMDUR  Take 1 tablet (30 mg total) by mouth daily.     loperamide 2 MG tablet  Commonly known as:  IMODIUM A-D  Take 1 tablet (2 mg total) by mouth 4 (four) times daily as needed for diarrhea or loose stools.     LORazepam 1 MG tablet  Commonly known as:  ATIVAN  Take 1 tablet (1 mg total) by mouth every 8 (eight) hours as needed for anxiety.     mometasone-formoterol 100-5 MCG/ACT Aero  Commonly known as:  DULERA  Inhale 2 puffs into the lungs 2 (two) times daily.     nitroGLYCERIN 0.4 MG SL tablet  Commonly known as:  NITROSTAT  Place 1 tablet (0.4 mg total) under the tongue every 5 (five) minutes as needed for chest pain.     OxyCODONE 80 mg T12a 12 hr tablet  Commonly known as:  OXYCONTIN  Take 1 tablet (80 mg total) by mouth every 12 (twelve) hours.     predniSONE 10 MG tablet  Commonly known as:  DELTASONE  Label  & dispense according to the schedule below. 5 Pills PO for 1 day then, 4 Pills PO for 1 day, 3 Pills PO for 1 day, 2 Pills PO for 1 day, 1 Pill PO for 1 days then STOP.     ranolazine 500 MG 12 hr tablet  Commonly known as:  RANEXA  Take 1 tablet (500 mg total) by mouth 2 (two) times daily.     tiotropium 18 MCG inhalation capsule  Commonly known as:  SPIRIVA  Place 1 capsule (18 mcg total) into  inhaler and inhale daily.     zolpidem 10 MG tablet  Commonly known as:  AMBIEN  Take 1 tablet (10 mg total) by mouth at bedtime as needed for sleep.           Total Time in preparing paper work, data evaluation and todays exam - 35 minutes  Dustin Flock M.D on 05/25/2015 at 11:07 AM  Lynn Butler, 70 y.o., DOB [birth year], MRN [id number]. Admission date: 05/21/2015 Discharge Date 05/25/2015 Primary MD Juanell Fairly, MD Admitting Physician Lytle Butte, MD  .

## 2015-05-26 LAB — CULTURE, BLOOD (ROUTINE X 2)
CULTURE: NO GROWTH
Culture: NO GROWTH

## 2015-05-31 NOTE — Progress Notes (Signed)
04/09/15 1514  PT Visit Information  Last PT Received On 04/09/15  Assistance Needed +1  History of Present Illness Pt admitted for sepsis and AMS secondary to HCAP. Pt with history of CAD, MI, HTN, and skin cancer. Pt with large wound on forehead. Per chart review, pt found in home on room air (wears chronic O2) and covered in feces/urine with ants crawling out of socks. Pt currently on isolation for MRSA.   Precautions  Precautions (isolation)  Restrictions  Weight Bearing Restrictions No  Home Living  Family/patient expects to be discharged to: Private residence  Living Arrangements Non-relatives/Friends  Available Help at Discharge Friend(s)  Type of [place] to enter  Entrance Stairs-Number of Steps 4  Entrance Stairs-Rails Can reach both  [place] One level  Home Equipment (reports having no equipment at home?)  Additional Comments previous note reports pt having rollater/rw/WC etc  Prior Function  Level of Independence Independent  Communication  Communication No difficulties;Other (comment)  Pain Assessment  Pain Assessment No/denies pain  Cognition  Arousal/Alertness Awake/alert  Behavior During Therapy WFL for tasks assessed/performed  Overall Cognitive Status Within Functional Limits for tasks assessed  Upper Extremity Assessment  Upper Extremity Assessment Generalized weakness (B UE grossly 3/5; )  Lower Extremity Assessment  Lower Extremity Assessment Generalized weakness;LLE deficits/detail (B LE grossly 4/5)  LLE Sensation decreased light touch  Bed Mobility  Overal bed mobility Needs Assistance  Bed Mobility Supine to Sit  Supine to sit Min assist  General bed mobility comments pt able to initiate transfer, however requires assist to scoot towards EOB as well as cues for sequencing. Once sitting at EOB, pt able to sit with cga.  Transfers  Overall transfer level Needs assistance  Equipment used Rolling walker (2 wheeled)   Transfers Sit to/from Stand  Sit to Stand Min assist  General transfer comment sit<>Stand with rw. Pt requires cues for correct/safe hand placement. Once standing, pt with kyphotic posture with cues for upright standing. All mobility performed on 3L of O2. Sats WNL  Ambulation/Gait  Ambulation/Gait assistance Min guard  Ambulation Distance (Feet) 3 Feet  Assistive device Rolling walker (2 wheeled)  Gait Pattern/deviations Step-to pattern  General Gait Details ambulated to recliner with rw and slow gait speed. Pt required cues for sequencing and required use of rw for safety. Pt fatigues quickly and refuses further ambulation. O2 sats at 93% post mobility.  Gait velocity interpretation Below normal speed for age/gender  Balance  Overall balance assessment Needs assistance  Sitting-balance support Bilateral upper extremity supported  Sitting balance-Leahy Scale Good  Standing balance support Bilateral upper extremity supported  Standing balance-Leahy Scale Fair  PT - End of Session  Equipment Utilized During Treatment Gait belt;Oxygen  Activity Tolerance Patient limited by fatigue;Patient limited by pain  Patient left in chair;with chair alarm set  Nurse Communication Mobility status  PT Assessment  PT Therapy Diagnosis  Difficulty walking;Generalized weakness  PT Recommendation/Assessment Patient needs continued PT services  PT Problem List Decreased strength;Decreased knowledge of use of DME;Decreased safety awareness;Decreased mobility;Decreased activity tolerance  PT Plan  PT Frequency (ACUTE ONLY) Min 2X/week  PT Treatment/Interventions (ACUTE ONLY) Gait training;DME instruction;Therapeutic exercise  PT Recommendation  Follow Up Recommendations SNF  PT equipment Rolling walker with 5" wheels  Individuals Consulted  Consulted and Agree with Results and Recommendations Patient  Acute Rehab PT Goals  Patient Stated Goal to go to [facility]  PT Goal Formulation With patient  Time For  Goal Achievement 04/23/15  Potential to Achieve Goals Fair  PT Time Calculation  PT Start Time (ACUTE ONLY) 1435  PT Stop Time (ACUTE ONLY) 1502  PT Time Calculation (min) (ACUTE ONLY) 27 min  PT G-Codes **NOT FOR INPATIENT CLASS**  Functional Assessment Tool Used clinical judgement  Functional Limitation Mobility: Walking and moving around  Mobility: Walking and Moving Around Current Status (P0141) CJ  Mobility: Walking and Moving Around Goal Status (C3013) CI  PT General Charges  $$ ACUTE PT VISIT 1 Procedure  PT Evaluation  $Initial PT Evaluation Tier I 1 Procedure    Late entry G codes for 19-Apr-2015  Greggory Stallion, PT, DPT [phone]

## 2015-06-02 NOTE — Progress Notes (Signed)
Late entry for missed G-code. Based on review of the evaluation and goals by this therapist. Orinda Kenner, [place], [place]

## 2015-06-10 ENCOUNTER — Inpatient Hospital Stay: Payer: Medicare Other

## 2015-06-10 ENCOUNTER — Emergency Department: Payer: Medicare Other

## 2015-06-10 ENCOUNTER — Encounter: Payer: Self-pay | Admitting: *Deleted

## 2015-06-10 ENCOUNTER — Inpatient Hospital Stay
Admission: EM | Admit: 2015-06-10 | Discharge: 2015-07-05 | DRG: 870 | Disposition: A | Discharge status: Skilled Nursing Facility | Payer: Medicare Other | Attending: Internal Medicine | Admitting: Internal Medicine

## 2015-06-10 DIAGNOSIS — R579 Shock, unspecified: Secondary | ICD-10-CM | POA: Diagnosis not present

## 2015-06-10 DIAGNOSIS — E785 Hyperlipidemia, unspecified: Secondary | ICD-10-CM | POA: Diagnosis present

## 2015-06-10 DIAGNOSIS — A419 Sepsis, unspecified organism: Secondary | ICD-10-CM | POA: Diagnosis present

## 2015-06-10 DIAGNOSIS — R69 Illness, unspecified: Secondary | ICD-10-CM | POA: Diagnosis not present

## 2015-06-10 DIAGNOSIS — Z66 Do not resuscitate: Secondary | ICD-10-CM | POA: Diagnosis not present

## 2015-06-10 DIAGNOSIS — E43 Unspecified severe protein-calorie malnutrition: Secondary | ICD-10-CM | POA: Diagnosis present

## 2015-06-10 DIAGNOSIS — E872 Acidosis: Secondary | ICD-10-CM | POA: Diagnosis present

## 2015-06-10 DIAGNOSIS — F17210 Nicotine dependence, cigarettes, uncomplicated: Secondary | ICD-10-CM | POA: Diagnosis present

## 2015-06-10 DIAGNOSIS — R6521 Severe sepsis with septic shock: Secondary | ICD-10-CM | POA: Diagnosis present

## 2015-06-10 DIAGNOSIS — J13 Pneumonia due to Streptococcus pneumoniae: Secondary | ICD-10-CM | POA: Diagnosis present

## 2015-06-10 DIAGNOSIS — I252 Old myocardial infarction: Secondary | ICD-10-CM | POA: Diagnosis not present

## 2015-06-10 DIAGNOSIS — I480 Paroxysmal atrial fibrillation: Secondary | ICD-10-CM | POA: Diagnosis present

## 2015-06-10 DIAGNOSIS — K529 Noninfective gastroenteritis and colitis, unspecified: Secondary | ICD-10-CM | POA: Diagnosis present

## 2015-06-10 DIAGNOSIS — J9622 Acute and chronic respiratory failure with hypercapnia: Secondary | ICD-10-CM | POA: Diagnosis present

## 2015-06-10 DIAGNOSIS — R4182 Altered mental status, unspecified: Secondary | ICD-10-CM

## 2015-06-10 DIAGNOSIS — N19 Unspecified kidney failure: Secondary | ICD-10-CM | POA: Diagnosis not present

## 2015-06-10 DIAGNOSIS — Z85828 Personal history of other malignant neoplasm of skin: Secondary | ICD-10-CM | POA: Diagnosis not present

## 2015-06-10 DIAGNOSIS — Z8249 Family history of ischemic heart disease and other diseases of the circulatory system: Secondary | ICD-10-CM | POA: Diagnosis not present

## 2015-06-10 DIAGNOSIS — E876 Hypokalemia: Secondary | ICD-10-CM | POA: Diagnosis not present

## 2015-06-10 DIAGNOSIS — R109 Unspecified abdominal pain: Secondary | ICD-10-CM

## 2015-06-10 DIAGNOSIS — L89152 Pressure ulcer of sacral region, stage 2: Secondary | ICD-10-CM | POA: Diagnosis present

## 2015-06-10 DIAGNOSIS — D509 Iron deficiency anemia, unspecified: Secondary | ICD-10-CM | POA: Diagnosis present

## 2015-06-10 DIAGNOSIS — Z955 Presence of coronary angioplasty implant and graft: Secondary | ICD-10-CM | POA: Diagnosis not present

## 2015-06-10 DIAGNOSIS — R0600 Dyspnea, unspecified: Secondary | ICD-10-CM

## 2015-06-10 DIAGNOSIS — Z9981 Dependence on supplemental oxygen: Secondary | ICD-10-CM | POA: Diagnosis not present

## 2015-06-10 DIAGNOSIS — Y95 Nosocomial condition: Secondary | ICD-10-CM | POA: Diagnosis present

## 2015-06-10 DIAGNOSIS — Z803 Family history of malignant neoplasm of breast: Secondary | ICD-10-CM | POA: Diagnosis not present

## 2015-06-10 DIAGNOSIS — R34 Anuria and oliguria: Secondary | ICD-10-CM | POA: Diagnosis present

## 2015-06-10 DIAGNOSIS — Z681 Body mass index (BMI) 19 or less, adult: Secondary | ICD-10-CM | POA: Diagnosis not present

## 2015-06-10 DIAGNOSIS — D638 Anemia in other chronic diseases classified elsewhere: Secondary | ICD-10-CM | POA: Diagnosis present

## 2015-06-10 DIAGNOSIS — K55059 Acute (reversible) ischemia of intestine, part and extent unspecified: Secondary | ICD-10-CM | POA: Diagnosis not present

## 2015-06-10 DIAGNOSIS — I10 Essential (primary) hypertension: Secondary | ICD-10-CM | POA: Diagnosis present

## 2015-06-10 DIAGNOSIS — I5042 Chronic combined systolic (congestive) and diastolic (congestive) heart failure: Secondary | ICD-10-CM | POA: Diagnosis not present

## 2015-06-10 DIAGNOSIS — J441 Chronic obstructive pulmonary disease with (acute) exacerbation: Secondary | ICD-10-CM | POA: Diagnosis present

## 2015-06-10 DIAGNOSIS — I255 Ischemic cardiomyopathy: Secondary | ICD-10-CM | POA: Diagnosis present

## 2015-06-10 DIAGNOSIS — G9341 Metabolic encephalopathy: Secondary | ICD-10-CM | POA: Diagnosis present

## 2015-06-10 DIAGNOSIS — IMO0002 Reserved for concepts with insufficient information to code with codable children: Secondary | ICD-10-CM | POA: Diagnosis present

## 2015-06-10 DIAGNOSIS — I70248 Atherosclerosis of native arteries of left leg with ulceration of other part of lower left leg: Secondary | ICD-10-CM | POA: Diagnosis present

## 2015-06-10 DIAGNOSIS — Z79891 Long term (current) use of opiate analgesic: Secondary | ICD-10-CM | POA: Diagnosis not present

## 2015-06-10 DIAGNOSIS — Z22322 Carrier or suspected carrier of Methicillin resistant Staphylococcus aureus: Secondary | ICD-10-CM

## 2015-06-10 DIAGNOSIS — J45909 Unspecified asthma, uncomplicated: Secondary | ICD-10-CM | POA: Diagnosis present

## 2015-06-10 DIAGNOSIS — J189 Pneumonia, unspecified organism: Secondary | ICD-10-CM | POA: Diagnosis present

## 2015-06-10 DIAGNOSIS — J9621 Acute and chronic respiratory failure with hypoxia: Secondary | ICD-10-CM | POA: Diagnosis not present

## 2015-06-10 DIAGNOSIS — J9602 Acute respiratory failure with hypercapnia: Secondary | ICD-10-CM

## 2015-06-10 DIAGNOSIS — D689 Coagulation defect, unspecified: Secondary | ICD-10-CM | POA: Diagnosis present

## 2015-06-10 DIAGNOSIS — J449 Chronic obstructive pulmonary disease, unspecified: Secondary | ICD-10-CM | POA: Diagnosis not present

## 2015-06-10 DIAGNOSIS — A403 Sepsis due to Streptococcus pneumoniae: Secondary | ICD-10-CM | POA: Diagnosis present

## 2015-06-10 DIAGNOSIS — K7200 Acute and subacute hepatic failure without coma: Secondary | ICD-10-CM | POA: Diagnosis present

## 2015-06-10 DIAGNOSIS — E161 Other hypoglycemia: Secondary | ICD-10-CM | POA: Diagnosis not present

## 2015-06-10 DIAGNOSIS — Z79899 Other long term (current) drug therapy: Secondary | ICD-10-CM | POA: Diagnosis not present

## 2015-06-10 DIAGNOSIS — I110 Hypertensive heart disease with heart failure: Secondary | ICD-10-CM | POA: Diagnosis present

## 2015-06-10 DIAGNOSIS — I5023 Acute on chronic systolic (congestive) heart failure: Secondary | ICD-10-CM | POA: Diagnosis not present

## 2015-06-10 DIAGNOSIS — L8990 Pressure ulcer of unspecified site, unspecified stage: Secondary | ICD-10-CM | POA: Insufficient documentation

## 2015-06-10 DIAGNOSIS — I2510 Atherosclerotic heart disease of native coronary artery without angina pectoris: Secondary | ICD-10-CM | POA: Diagnosis present

## 2015-06-10 DIAGNOSIS — M549 Dorsalgia, unspecified: Secondary | ICD-10-CM | POA: Diagnosis present

## 2015-06-10 DIAGNOSIS — J440 Chronic obstructive pulmonary disease with acute lower respiratory infection: Secondary | ICD-10-CM | POA: Diagnosis present

## 2015-06-10 DIAGNOSIS — G894 Chronic pain syndrome: Secondary | ICD-10-CM | POA: Diagnosis present

## 2015-06-10 DIAGNOSIS — E875 Hyperkalemia: Secondary | ICD-10-CM | POA: Diagnosis not present

## 2015-06-10 DIAGNOSIS — J9601 Acute respiratory failure with hypoxia: Secondary | ICD-10-CM

## 2015-06-10 DIAGNOSIS — L97829 Non-pressure chronic ulcer of other part of left lower leg with unspecified severity: Secondary | ICD-10-CM | POA: Diagnosis present

## 2015-06-10 DIAGNOSIS — N179 Acute kidney failure, unspecified: Secondary | ICD-10-CM | POA: Diagnosis not present

## 2015-06-10 DIAGNOSIS — I214 Non-ST elevation (NSTEMI) myocardial infarction: Secondary | ICD-10-CM | POA: Diagnosis present

## 2015-06-10 DIAGNOSIS — N170 Acute kidney failure with tubular necrosis: Secondary | ICD-10-CM | POA: Diagnosis present

## 2015-06-10 DIAGNOSIS — B9562 Methicillin resistant Staphylococcus aureus infection as the cause of diseases classified elsewhere: Secondary | ICD-10-CM | POA: Diagnosis present

## 2015-06-10 DIAGNOSIS — R0602 Shortness of breath: Secondary | ICD-10-CM

## 2015-06-10 DIAGNOSIS — J9690 Respiratory failure, unspecified, unspecified whether with hypoxia or hypercapnia: Secondary | ICD-10-CM

## 2015-06-10 LAB — URINALYSIS COMPLETE WITH MICROSCOPIC (ARMC ONLY)
Bilirubin Urine: NEGATIVE
GLUCOSE, UA: NEGATIVE mg/dL
Ketones, ur: NEGATIVE mg/dL
Nitrite: NEGATIVE
PH: 5 (ref 5.0–8.0)
Protein, ur: 100 mg/dL — AB
Specific Gravity, Urine: 1.018 (ref 1.005–1.030)

## 2015-06-10 LAB — LACTIC ACID, PLASMA: Lactic Acid, Venous: 6.7 mmol/L (ref 0.5–2.0)

## 2015-06-10 LAB — CBC WITH DIFFERENTIAL/PLATELET
BASOS ABS: 0 10*3/uL (ref 0–0.1)
EOS ABS: 0 10*3/uL (ref 0–0.7)
HCT: 35.3 % (ref 35.0–47.0)
Hemoglobin: 10.2 g/dL — ABNORMAL LOW (ref 12.0–16.0)
Lymphs Abs: 0.4 10*3/uL — ABNORMAL LOW (ref 1.0–3.6)
MCH: 20 pg — ABNORMAL LOW (ref 26.0–34.0)
MCHC: 29 g/dL — ABNORMAL LOW (ref 32.0–36.0)
MCV: 69.1 fL — AB (ref 80.0–100.0)
MONO ABS: 1.1 10*3/uL — AB (ref 0.2–0.9)
Monocytes Relative: 5 %
Neutro Abs: 19.6 10*3/uL — ABNORMAL HIGH (ref 1.4–6.5)
Neutrophils Relative %: 93 %
PLATELETS: 219 10*3/uL (ref 150–440)
RBC: 5.1 MIL/uL (ref 3.80–5.20)
RDW: 21.7 % — AB (ref 11.5–14.5)
WBC: 21.1 10*3/uL — AB (ref 3.6–11.0)

## 2015-06-10 LAB — GLUCOSE, CAPILLARY: GLUCOSE-CAPILLARY: 91 mg/dL (ref 65–99)

## 2015-06-10 LAB — COMPREHENSIVE METABOLIC PANEL
ALK PHOS: 373 U/L — AB (ref 38–126)
ALT: 643 U/L — AB (ref 14–54)
ANION GAP: 15 (ref 5–15)
AST: 1138 U/L — ABNORMAL HIGH (ref 15–41)
Albumin: 2.6 g/dL — ABNORMAL LOW (ref 3.5–5.0)
BILIRUBIN TOTAL: 1.2 mg/dL (ref 0.3–1.2)
BUN: 21 mg/dL — ABNORMAL HIGH (ref 6–20)
CALCIUM: 8.1 mg/dL — AB (ref 8.9–10.3)
CO2: 24 mmol/L (ref 22–32)
CREATININE: 1.71 mg/dL — AB (ref 0.44–1.00)
Chloride: 98 mmol/L — ABNORMAL LOW (ref 101–111)
GFR, EST AFRICAN AMERICAN: 34 mL/min — AB (ref >60)
GFR, EST NON AFRICAN AMERICAN: 30 mL/min — AB (ref >60)
Glucose, Bld: 216 mg/dL — ABNORMAL HIGH (ref 65–99)
Potassium: 5.4 mmol/L — ABNORMAL HIGH (ref 3.5–5.1)
Sodium: 137 mmol/L (ref 135–145)
TOTAL PROTEIN: 5.8 g/dL — AB (ref 6.5–8.1)

## 2015-06-10 LAB — LIPASE, BLOOD: LIPASE: 46 U/L (ref 11–51)

## 2015-06-10 LAB — APTT: APTT: 41 s — AB (ref 24–36)

## 2015-06-10 LAB — TROPONIN I: TROPONIN I: 0.76 ng/mL — AB (ref <0.031)

## 2015-06-10 LAB — PROTIME-INR
INR: 2.26
Prothrombin Time: 24.7 seconds — ABNORMAL HIGH (ref 11.4–15.0)

## 2015-06-10 MED ORDER — SODIUM CHLORIDE 0.9 % IV SOLN
INTRAVENOUS | Status: DC
  Administered 2015-06-10: 23:00:00 via INTRAVENOUS

## 2015-06-10 MED ORDER — PROPOFOL 1000 MG/100ML IV EMUL
5.0000 ug/kg/min | Freq: Once | INTRAVENOUS | Status: AC
  Administered 2015-06-10: 5 ug/kg/min via INTRAVENOUS
  Filled 2015-06-10: qty 100

## 2015-06-10 MED ORDER — VANCOMYCIN HCL IN DEXTROSE 1-5 GM/200ML-% IV SOLN
1000.0000 mg | Freq: Once | INTRAVENOUS | Status: AC
  Administered 2015-06-10: 1000 mg via INTRAVENOUS
  Filled 2015-06-10: qty 200

## 2015-06-10 MED ORDER — DEXTROSE 5 % IV SOLN
500.0000 mg | INTRAVENOUS | Status: DC
  Administered 2015-06-10 – 2015-06-11 (×2): 500 mg via INTRAVENOUS
  Filled 2015-06-10 (×3): qty 500

## 2015-06-10 MED ORDER — NOREPINEPHRINE BITARTRATE 1 MG/ML IV SOLN: 0.0000 ug/min | INTRAVENOUS | Status: DC

## 2015-06-10 MED ORDER — SODIUM CHLORIDE 0.9 % IV BOLUS (SEPSIS)
1000.0000 mL | INTRAVENOUS | Status: AC
  Administered 2015-06-10 (×2): 1000 mL via INTRAVENOUS

## 2015-06-10 MED ORDER — PIPERACILLIN-TAZOBACTAM 3.375 G IVPB 30 MIN
3.3750 g | Freq: Three times a day (TID) | INTRAVENOUS | Status: DC
  Administered 2015-06-10: 3.375 g via INTRAVENOUS
  Filled 2015-06-10 (×3): qty 50

## 2015-06-10 MED ORDER — NOREPINEPHRINE 4 MG/250ML-% IV SOLN
0.0000 ug/min | INTRAVENOUS | Status: DC
  Administered 2015-06-11: 5 ug/min via INTRAVENOUS
  Filled 2015-06-10: qty 250

## 2015-06-10 NOTE — H&P (Signed)
[facility] NAME: Lynn Butler    MR#:  [id number]  DATE OF BIRTH:  1946/06/20  DATE OF ADMISSION:  06/10/2015  PRIMARY CARE PHYSICIAN: Juanell Fairly, MD   REQUESTING/REFERRING PHYSICIAN: Karma Greaser, M.D.  CHIEF COMPLAINT:   Chief Complaint  Patient presents with  . Respiratory Arrest    HISTORY OF PRESENT ILLNESS:  Lynn Butler  is a 69 y.o. female who presents with respiratory failure and hypoxia. Patient was found unresponsive by her daughter who called EMS and had her brought to the ED. EMS found the patient's O2 sats to be 50. Daughter states that her mother had been feeling unwell for the past couple of days, and was slowly getting worse. However, until this afternoon she has still been up and around and seemed to be feeling more ill, but was otherwise her usual self. Patient has severe COPD and is on oxygen at home at baseline. Her daughter felt like she had likely developed pneumonia given that she was acting. Patient laid down to take a nap this afternoon, and her daughter leaned on room with her children to nap at the same time. After getting up, she went to check on her mother and found her nonresponsive. On evaluation in the ED, patient was found to be hypoxic and hypotensive. She was intubated and placed on a vent. She was given aggressive fluid resuscitation. Chest x-ray seems to show pneumonia. On lab evaluation she has what appears to be multiorgan dysfunction with elevated transaminases, elevated troponin, elevated creatinine, and a significantly elevated lactic acid. PH on ABG was 7.19. Hospitalists were called for admission.  PAST MEDICAL HISTORY:   Past Medical History  Diagnosis Date  . COPD (chronic obstructive pulmonary disease) ([place])   . Hypertension   . Ischemic cardiomyopathy     a.   . Coronary artery disease     a. s/p multiple stenting in 2005  . History of ventricular tachycardia   . PVD  (peripheral vascular disease) ([place])   . PAD (peripheral artery disease) ([place])   . HLD (hyperlipidemia)   . Chronic back pain   . MI, old   . MRSA (methicillin resistant staph aureus) culture positive     left foot wound  . CHF (congestive heart failure) ([place])   . Pneumonia   . Asthma   . Cancer [facility])     PAST SURGICAL HISTORY:   Past Surgical History  Procedure Laterality Date  . Insert / replace / remove pacemaker    . Vascular bypass surgery Bilateral   . Cardiac catheterization N/A 01/14/2015    Procedure: Left Heart Cath;  Surgeon: Wellington Hampshire, MD;  Location: [facility];  Service: Cardiovascular;  Laterality: N/A;  . Cardiac defibrillator placement    . Appendectomy    . Cesarean section    . Abdominal surgery      SOCIAL HISTORY:   Social History  Substance Use Topics  . Smoking status: Current Every Day Smoker -- 0.50 packs/day    Types: Cigarettes  . Smokeless tobacco: Never Used  . Alcohol Use: No    FAMILY HISTORY:   Family History  Problem Relation Age of Onset  . CAD Other   . Breast cancer Mother   . Heart disease Mother   . Coronary artery disease Father     DRUG ALLERGIES:   Allergies  Allergen Reactions  . Nsaids Nausea And Vomiting and Other (See Comments)  Reaction:  GI distress and burning     MEDICATIONS AT HOME:   Prior to Admission medications   Medication Sig Start Date End Date Taking? Authorizing Provider  albuterol (PROVENTIL HFA;VENTOLIN HFA) 108 (90 BASE) MCG/ACT inhaler Inhale 2 puffs into the lungs every 4 (four) hours as needed for wheezing or shortness of breath.   Yes Historical Provider, MD  atorvastatin (LIPITOR) 40 MG tablet Take 1 tablet (40 mg total) by mouth daily at 6 PM. 01/17/15  Yes Gladstone Lighter, MD  benzonatate (TESSALON) 200 MG capsule Take 1 capsule (200 mg total) by mouth 3 (three) times daily as needed for cough. 02/11/15  Yes Vaughan Basta, MD  carvedilol (COREG) 3.125 MG tablet  Take 1 tablet (3.125 mg total) by mouth 2 (two) times daily with a meal. 01/17/15  Yes Gladstone Lighter, MD  collagenase (SANTYL) ointment Apply 1 application topically daily.   Yes Historical Provider, MD  diazepam (VALIUM) 10 MG tablet Take 1 tablet (10 mg total) by mouth every 12 (twelve) hours as needed for anxiety. 02/03/15  Yes Nicholes Mango, MD  feeding supplement, ENSURE ENLIVE, (ENSURE ENLIVE) LIQD Take 237 mLs by mouth 3 (three) times daily with meals. 02/11/15  Yes Vaughan Basta, MD  guaiFENesin (MUCINEX) 600 MG 12 hr tablet Take 1 tablet (600 mg total) by mouth 2 (two) times daily. 02/11/15  Yes Vaughan Basta, MD  HYDROmorphone (DILAUDID) 4 MG tablet Take 1 tablet (4 mg total) by mouth every 6 (six) hours as needed for severe pain. 04/11/15  Yes Henreitta Leber, MD  ipratropium-albuterol (DUONEB) 0.5-2.5 (3) MG/3ML SOLN Take 3 mLs by nebulization every 6 (six) hours as needed. Patient taking differently: Take 3 mLs by nebulization every 6 (six) hours as needed (for wheezing/shortness of breath).  02/11/15  Yes Vaughan Basta, MD  isosorbide mononitrate (IMDUR) 30 MG 24 hr tablet Take 1 tablet (30 mg total) by mouth daily. 02/11/15  Yes Vaughan Basta, MD  loperamide (IMODIUM A-D) 2 MG tablet Take 1 tablet (2 mg total) by mouth 4 (four) times daily as needed for diarrhea or loose stools. 04/11/15  Yes Henreitta Leber, MD  mometasone-formoterol (DULERA) 100-5 MCG/ACT AERO Inhale 2 puffs into the lungs 2 (two) times daily. 04/11/15  Yes Henreitta Leber, MD  OxyCODONE (OXYCONTIN) 80 mg T12A 12 hr tablet Take 1 tablet (80 mg total) by mouth every 12 (twelve) hours. 04/11/15  Yes Henreitta Leber, MD  promethazine (PHENERGAN) 25 MG tablet Take 25 mg by mouth daily as needed for nausea or vomiting.   Yes Historical Provider, MD  tiotropium (SPIRIVA) 18 MCG inhalation capsule Place 1 capsule (18 mcg total) into inhaler and inhale daily. 04/11/15  Yes Henreitta Leber, MD  zolpidem  (AMBIEN) 10 MG tablet Take 5 mg by mouth at bedtime as needed for sleep.   Yes Historical Provider, MD    REVIEW OF SYSTEMS:  Review of Systems  Unable to perform ROS: critical illness     VITAL SIGNS:   Filed Vitals:   06/10/15 2150 06/10/15 2200 06/10/15 2220 06/10/15 2230  BP: 80/45 90/47 90/49  89/49  Pulse: 83 84 81 80  Temp:  95 F (35 C)    Resp: 22 21 23 21   Height:      Weight:      SpO2: 99% 99% 100% 100%   Wt Readings from Last 3 Encounters:  06/10/15 51.256 kg (113 lb)  05/21/15 40.824 kg (90 lb)  04/08/15 53.116 kg (117 lb  1.6 oz)    PHYSICAL EXAMINATION:  Physical Exam  Vitals reviewed. Constitutional: She appears well-developed.  Cachectic  HENT:  Head: Normocephalic and atraumatic.  Mouth/Throat: Oropharynx is clear and moist.  Eyes: Conjunctivae are normal. No scleral icterus.  Pupils unequal, and very sluggishly reactive  Neck: Normal range of motion. Neck supple. No JVD present. No thyromegaly present.  Cardiovascular: Normal rate, regular rhythm and intact distal pulses.  Exam reveals no gallop and no friction rub.   No murmur heard. Respiratory: Effort normal. No respiratory distress. She has no wheezes. She has rales (Right base).  Diffuse rhonchorous breath sounds, potentially transmitted upper airway sounds from mechanical ventilation.  GI: Soft. Bowel sounds are normal. She exhibits no distension. There is no tenderness.  Musculoskeletal: Normal range of motion. She exhibits no edema.  No arthritis, no gout  Lymphadenopathy:    She has no cervical adenopathy.  Neurological: No cranial nerve deficit.  Unable to assess due to patient condition  Skin: Skin is warm and dry. No rash noted. No erythema.  Psychiatric:  Unable to assess due to patient condition    LABORATORY PANEL:   CBC  Recent Labs Lab 06/10/15 1930  WBC 21.1*  HGB 10.2*  HCT 35.3  PLT 219    ------------------------------------------------------------------------------------------------------------------  Chemistries   Recent Labs Lab 06/10/15 1930  NA 137  K 5.4*  CL 98*  CO2 24  GLUCOSE 216*  BUN 21*  CREATININE 1.71*  CALCIUM 8.1*  AST 1138*  ALT 643*  ALKPHOS 373*  BILITOT 1.2   ------------------------------------------------------------------------------------------------------------------  Cardiac Enzymes  Recent Labs Lab 06/10/15 1930  TROPONINI 0.76*   ------------------------------------------------------------------------------------------------------------------  RADIOLOGY:  Dg Chest Portable 1 View  06/10/2015  CLINICAL DATA:  Post CVC placement. EXAM: PORTABLE CHEST 1 VIEW COMPARISON:  06/10/2015 FINDINGS: Endotracheal tube is in place with tip in the lower trachea, estimated to be 3.3 cm above the carina. Nasogastric tube is in place with tip off the film but beyond the gastroesophageal junction. Left-sided AICD lead to the right ventricle. A left IJ central line has been placed, tip overlying the level of superior vena cava. Cardiomegaly. Small bilateral pleural effusions. Minimal patchy density at the right lung base persists. No pneumothorax. IMPRESSION: 1. Interval placement of left IJ central line. 2. Stable cardiomegaly.  Minimal right lower lobe infiltrate. Electronically Signed   By: Nolon Nations M.D.   On: 06/10/2015 21:51   Dg Chest Portable 1 View  06/10/2015  CLINICAL DATA:  Patient unresponsive.  Initial encounter. EXAM: PORTABLE CHEST 1 VIEW COMPARISON:  Chest radiograph and CTA of the chest performed 05/21/2015 FINDINGS: The patient's endotracheal tube is seen ending 4 cm above the carina. An enteric tube is noted extending below the diaphragm. Right basilar airspace opacity is concerning for pneumonia. The left lung appears relatively clear. No pleural effusion or pneumothorax is seen. The cardiomediastinal silhouette is  borderline enlarged. An AICD is noted overlying the left chest wall, with a single lead ending overlying the right ventricle. No acute osseous abnormalities are identified. IMPRESSION: 1. Endotracheal tube seen ending 4 cm above the carina. 2. Right basilar airspace opacity is concerning for pneumonia. 3. Borderline cardiomegaly. Electronically Signed   By: Garald Balding M.D.   On: 06/10/2015 20:21   Dg Abd Portable 1v  06/10/2015  CLINICAL DATA:  Patient unresponsive, acute onset. Initial encounter. EXAM: PORTABLE ABDOMEN - 1 VIEW COMPARISON:  CT of the abdomen and pelvis from 12/28/2014 FINDINGS: An enteric tube is noted ending  overlying the body of the stomach. There is distention of the stomach and a colonic loop. This may reflect mild ileus. Evaluation for free intra-abdominal air is limited on a single supine view, though no definite free air is seen. Stool is noted within the sigmoid colon. Clips are noted within the right upper quadrant, reflecting prior cholecystectomy. And AICD lead is noted ending overlying the right ventricle. Mild right basilar airspace opacity is seen. IMPRESSION: 1. Enteric tube noted ending overlying the body of the stomach. 2. Distention of the stomach and colonic loops may reflect mild ileus. No definite free intra-abdominal air seen, though evaluation for free air is limited on a single supine view. Stool noted within the sigmoid colon. 3. Mild right basilar airspace opacity is concerning for pneumonia. Electronically Signed   By: Garald Balding M.D.   On: 06/10/2015 20:20    EKG:   Orders placed or performed during the hospital encounter of 06/10/15  . EKG 12-Lead  . EKG 12-Lead    IMPRESSION AND PLAN:  Principal Problem:   Septic shock ([place]) - IV antibiotics given in the ED, aggressive fluid resuscitation initiated with good response and blood pressure with her MAP coming up to the 70s. Lactic acid was initially high at 6.7. We'll continue to check serially and  continue aggressive fluid resuscitation until this normalizes. Levophed ordered if needed to maintain map greater than 65. Propofol for sedation though this was initially stopped given her hypotension. Despite initial sedation cessation, patient remains unresponsive. Pancultures sent. Active Problems:   Multi-organ system dysfunction - likely from profound hypoxia and hypotension in the setting of septic shock. Treatment of shock and sepsis as above. Serial labs to monitor organ system improvement.   HCAP (healthcare-associated pneumonia) - patient was recently discharged from the hospital. Chest x-ray shows likely right sided pneumonia. Legionella antigen sent, and atypical antibiotics included an antibiotic regimen. We'll also get sputum culture from tracheal aspirate. Patient is intubated and on the vent.   Acute on chronic respiratory failure with hypoxia (H[facility]) - intubated for the same, this is due to above factors.   COPD mixed type ([place]) - patient on home oxygen, has little to no baseline respiratory reserve. Continue home COPD inhalers. Can give nebs here needed, will also give Solu-Medrol which may alternatively help with her blood pressure some too.   CAD (coronary artery disease) - continue home meds   Chronic combined systolic and diastolic CHF (congestive heart failure) ([place]) - continue home meds and patient is more stable   HTN (hypertension) - hold antihypertensives for now as patient is hypotensive as above    All the records are reviewed and case discussed with ED provider. Management plans discussed with the patient and/or family.  DVT PROPHYLAXIS: SubQ heparin  ADMISSION STATUS: Inpatient  CODE STATUS: Full  TOTAL CRITICAL CARE TIME TAKING CARE OF THIS PATIENT: 55 minutes.    Cheyann Blecha FIELDING 06/10/2015, 10:51 PM  [facility]  Office  [phone]  CC: Primary care physician; Juanell Fairly, MD

## 2015-06-10 NOTE — ED Provider Notes (Signed)
[facility] Emergency Department Provider Note  ____________________________________________  Time seen: Approximately 7:28 PM  I have reviewed the triage vital signs and the nursing notes.   HISTORY  Chief Complaint Respiratory Arrest  History is limited by the patient's respiratory arrest  HPI Lynn Butler is a 69 y.o. female with an extensive past medical history including COPD on oxygen at home, implanted defibrillator and prior ACS who presents by EMS with acute respiratory failure with agonal respirations with BVM assisted breaths.  She responds to noxious stimuli (IO in the right shoulder) but otherwise is unresponsive.  Slightly flutters her eyes when testing corneal reflex.   Initial spO2 at home for EMS was 50%, now at 99% w/ BVM.  Per EMS she has been ill recently but got acutely worse today according to the daughter.  The daughter is apparently on her way to the hospital.      Past Medical History  Diagnosis Date  . COPD (chronic obstructive pulmonary disease) ([place])   . Hypertension   . Ischemic cardiomyopathy     a.   . Coronary artery disease     a. s/p multiple stenting in 2005  . History of ventricular tachycardia   . PVD (peripheral vascular disease) ([place])   . PAD (peripheral artery disease) ([place])   . HLD (hyperlipidemia)   . Chronic back pain   . MI, old   . MRSA (methicillin resistant staph aureus) culture positive     left foot wound  . CHF (congestive heart failure) ([place])   . Pneumonia   . Asthma   . Cancer [facility])     Patient Active Problem List   Diagnosis Date Noted  . Multi-organ system dysfunction 06/10/2015  . Septic shock ([place]) 06/10/2015  . CAP (community acquired pneumonia) 06/10/2015  . Chest pain, central 05/21/2015  . Right lower lobe pneumonia 04/07/2015  . Acute on chronic respiratory failure with hypoxia ([place]) 04/07/2015  . Acute renal failure ([place]) 04/07/2015  . Leukocytosis 04/07/2015  . Metabolic  encephalopathy [id number]  . HCAP (healthcare-associated pneumonia) 02/06/2015  . Sepsis ([place]) 02/06/2015  . Chronic respiratory failure with hypoxia ([place]) 02/01/2015  . Syncope and collapse 02/01/2015  . Chronic combined systolic and diastolic CHF (congestive heart failure) ([place]) 02/01/2015  . Myocardial infarction in recovery phase ([place]) 01/20/2015  . HTN (hypertension) 01/20/2015  . Acute MI ([place]) 01/13/2015  . Protein-calorie malnutrition, severe ([place]) 07/22/2014  . COPD mixed type ([place]) 07/21/2014  . CAD (coronary artery disease) 07/21/2014  . CHF (congestive heart failure) ([place]) 07/21/2014  . PVD (peripheral vascular disease) ([place]) 07/21/2014  . Leg ulcer ([place]) 07/21/2014  . Skin lesion of face 07/21/2014  . Adult failure to thrive 07/21/2014  . Claudication of both lower extremities ([place]) 07/21/2014  . Smoker 07/21/2014    Past Surgical History  Procedure Laterality Date  . Insert / replace / remove pacemaker    . Vascular bypass surgery Bilateral   . Cardiac catheterization N/A 01/14/2015    Procedure: Left Heart Cath;  Surgeon: Wellington Hampshire, MD;  Location: [facility];  Service: Cardiovascular;  Laterality: N/A;  . Cardiac defibrillator placement    . Appendectomy    . Cesarean section    . Abdominal surgery      Current Outpatient Rx  Name  Route  Sig  Dispense  Refill  . albuterol (PROVENTIL HFA;VENTOLIN HFA) 108 (90 BASE) MCG/ACT inhaler   Inhalation   Inhale 2 puffs into the lungs every  4 (four) hours as needed for wheezing or shortness of breath.         Marland Kitchen atorvastatin (LIPITOR) 40 MG tablet   Oral   Take 1 tablet (40 mg total) by mouth daily at 6 PM.   30 tablet   0   . benzonatate (TESSALON) 200 MG capsule   Oral   Take 1 capsule (200 mg total) by mouth 3 (three) times daily as needed for cough.   20 capsule   0   . carvedilol (COREG) 3.125 MG tablet   Oral   Take 1 tablet (3.125 mg total) by mouth 2 (two) times daily with a meal.    60 tablet   2   . collagenase (SANTYL) ointment   Topical   Apply 1 application topically daily.         . diazepam (VALIUM) 10 MG tablet   Oral   Take 1 tablet (10 mg total) by mouth every 12 (twelve) hours as needed for anxiety.   30 tablet   0   . feeding supplement, ENSURE ENLIVE, (ENSURE ENLIVE) LIQD   Oral   Take 237 mLs by mouth 3 (three) times daily with meals.   237 mL   12   . guaiFENesin (MUCINEX) 600 MG 12 hr tablet   Oral   Take 1 tablet (600 mg total) by mouth 2 (two) times daily.   20 tablet   0   . HYDROmorphone (DILAUDID) 4 MG tablet   Oral   Take 1 tablet (4 mg total) by mouth every 6 (six) hours as needed for severe pain.   15 tablet   0   . ipratropium-albuterol (DUONEB) 0.5-2.5 (3) MG/3ML SOLN   Nebulization   Take 3 mLs by nebulization every 6 (six) hours as needed. Patient taking differently: Take 3 mLs by nebulization every 6 (six) hours as needed (for wheezing/shortness of breath).    360 mL   0   . isosorbide mononitrate (IMDUR) 30 MG 24 hr tablet   Oral   Take 1 tablet (30 mg total) by mouth daily.   30 tablet   0   . loperamide (IMODIUM A-D) 2 MG tablet   Oral   Take 1 tablet (2 mg total) by mouth 4 (four) times daily as needed for diarrhea or loose stools.   30 tablet   0   . mometasone-formoterol (DULERA) 100-5 MCG/ACT AERO   Inhalation   Inhale 2 puffs into the lungs 2 (two) times daily.         . OxyCODONE (OXYCONTIN) 80 mg T12A 12 hr tablet   Oral   Take 1 tablet (80 mg total) by mouth every 12 (twelve) hours.   14 tablet   0   . promethazine (PHENERGAN) 25 MG tablet   Oral   Take 25 mg by mouth daily as needed for nausea or vomiting.         . tiotropium (SPIRIVA) 18 MCG inhalation capsule   Inhalation   Place 1 capsule (18 mcg total) into inhaler and inhale daily.   30 capsule   12   . zolpidem (AMBIEN) 10 MG tablet   Oral   Take 5 mg by mouth at bedtime as needed for sleep.            Allergies Nsaids  Family History  Problem Relation Age of Onset  . CAD Other   . Breast cancer Mother   . Heart disease Mother   . Coronary artery  disease Father     Social History Social History  Substance Use Topics  . Smoking status: Current Every Day Smoker -- 0.50 packs/day    Types: Cigarettes  . Smokeless tobacco: Never Used  . Alcohol Use: No    Review of Systems Unable to obtain due to critical illness and acute respiratory failure ____________________________________________   PHYSICAL EXAM:  ED Triage Vitals  Enc Vitals Group     BP 06/10/15 1905 134/79 mmHg     Pulse Rate 06/10/15 1920 124     Resp 06/10/15 1912 25     Temp 06/10/15 1948 94.4 F (34.7 C)     Temp src --      SpO2 06/10/15 1920 100 %     Weight 06/10/15 1928 113 lb (51.256 kg)     Height 06/10/15 1928 5\' 5"  (1.651 m)     Head Cir --      Peak Flow --      Pain Score --      Pain Loc --      Pain Edu? --      Excl. in [place]? --     Constitutional: Toxic appearance with agonal respirations.  Cachectic. Eyes: Conjunctivae are normal. PERRL. EOMI. Head: Atraumatic. Nose: No epistaxis Mouth/Throat: Mucous membranes are dry.  Oropharynx non-erythematous. Neck: No stridor.   Cardiovascular: Tachycardia, regular rhythm. Grossly normal heart sounds.   Respiratory: Agonal respirations with coarse, thick breath sounds throughout lung fields.  Respirations maybe 6 times a minute Gastrointestinal: No distention.  Old abdominal scars.  No abdominal bruits. No CVA tenderness. Musculoskeletal: Infiltrated IV in the left wrist which was placed by EMS was nor functional.  No obvious injuries to the extremities. Neurologic:  Minimal corneal reflex. Withdraws from pain Skin:  Skin is warm, dry and intact. Pale.   ____________________________________________   LABS (all labs ordered are listed, but only abnormal results are displayed)  Labs Reviewed  BLOOD GAS, ARTERIAL - Abnormal; Notable  for the following:    pH, Arterial 7.19 (*)    pCO2 arterial 59 (*)    Acid-base deficit 6.5 (*)    Allens test (pass/fail) POSITIVE (*)    All other components within normal limits  LACTIC ACID, PLASMA - Abnormal; Notable for the following:    Lactic Acid, Venous 6.7 (*)    All other components within normal limits  COMPREHENSIVE METABOLIC PANEL - Abnormal; Notable for the following:    Potassium 5.4 (*)    Chloride 98 (*)    Glucose, Bld 216 (*)    BUN 21 (*)    Creatinine, Ser 1.71 (*)    Calcium 8.1 (*)    Total Protein 5.8 (*)    Albumin 2.6 (*)    AST 1138 (*)    ALT 643 (*)    Alkaline Phosphatase 373 (*)    GFR calc non Af Amer 30 (*)    GFR calc Af Amer 34 (*)    All other components within normal limits  TROPONIN I - Abnormal; Notable for the following:    Troponin I 0.76 (*)    All other components within normal limits  CBC WITH DIFFERENTIAL/PLATELET - Abnormal; Notable for the following:    WBC 21.1 (*)    Hemoglobin 10.2 (*)    MCV 69.1 (*)    MCH 20.0 (*)    MCHC 29.0 (*)    RDW 21.7 (*)    Neutro Abs 19.6 (*)    Lymphs Abs 0.4 (*)  Monocytes Absolute 1.1 (*)    All other components within normal limits  APTT - Abnormal; Notable for the following:    aPTT 41 (*)    All other components within normal limits  PROTIME-INR - Abnormal; Notable for the following:    Prothrombin Time 24.7 (*)    All other components within normal limits  URINALYSIS COMPLETEWITH MICROSCOPIC ([facility] ONLY) - Abnormal; Notable for the following:    Color, Urine AMBER (*)    APPearance CLOUDY (*)    Hgb urine dipstick 1+ (*)    Protein, ur 100 (*)    Leukocytes, UA 1+ (*)    Bacteria, UA FEW (*)    Squamous Epithelial / LPF 0-5 (*)    All other components within normal limits  CULTURE, BLOOD (ROUTINE X 2)  CULTURE, BLOOD (ROUTINE X 2)  URINE CULTURE  LIPASE, BLOOD  LACTIC ACID, PLASMA  LEGIONELLA PNEUMOPHILA SEROGP 1 UR AG  BLOOD GAS, ARTERIAL  BLOOD GAS, ARTERIAL   BLOOD GAS, ARTERIAL   ____________________________________________  EKG  ED ECG REPORT I, Alacia Rehmann, the attending physician, personally viewed and interpreted this ECG.   Date: 06/10/2015  EKG Time: 19:26  Rate: 122  Rhythm: sinus tachycardia  Axis: Normal  Intervals:Normal  ST&T Change: Possible ST depression in anterior leads V2, V3, V4, but difficult to appreciate given the amount of artifact.  ____________________________________________  RADIOLOGY I, Guilherme Schwenke, personally viewed and evaluated these images (plain radiographs) as part of my medical decision making.   Dg Chest Portable 1 View  06/10/2015  CLINICAL DATA:  Post CVC placement. EXAM: PORTABLE CHEST 1 VIEW COMPARISON:  06/10/2015 FINDINGS: Endotracheal tube is in place with tip in the lower trachea, estimated to be 3.3 cm above the carina. Nasogastric tube is in place with tip off the film but beyond the gastroesophageal junction. Left-sided AICD lead to the right ventricle. A left IJ central line has been placed, tip overlying the level of superior vena cava. Cardiomegaly. Small bilateral pleural effusions. Minimal patchy density at the right lung base persists. No pneumothorax. IMPRESSION: 1. Interval placement of left IJ central line. 2. Stable cardiomegaly.  Minimal right lower lobe infiltrate. Electronically Signed   By: Nolon Nations M.D.   On: 06/10/2015 21:51   Dg Chest Portable 1 View  06/10/2015  CLINICAL DATA:  Patient unresponsive.  Initial encounter. EXAM: PORTABLE CHEST 1 VIEW COMPARISON:  Chest radiograph and CTA of the chest performed 05/21/2015 FINDINGS: The patient's endotracheal tube is seen ending 4 cm above the carina. An enteric tube is noted extending below the diaphragm. Right basilar airspace opacity is concerning for pneumonia. The left lung appears relatively clear. No pleural effusion or pneumothorax is seen. The cardiomediastinal silhouette is borderline enlarged. An AICD is noted  overlying the left chest wall, with a single lead ending overlying the right ventricle. No acute osseous abnormalities are identified. IMPRESSION: 1. Endotracheal tube seen ending 4 cm above the carina. 2. Right basilar airspace opacity is concerning for pneumonia. 3. Borderline cardiomegaly. Electronically Signed   By: Garald Balding M.D.   On: 06/10/2015 20:21   Dg Abd Portable 1v  06/10/2015  CLINICAL DATA:  Patient unresponsive, acute onset. Initial encounter. EXAM: PORTABLE ABDOMEN - 1 VIEW COMPARISON:  CT of the abdomen and pelvis from 12/28/2014 FINDINGS: An enteric tube is noted ending overlying the body of the stomach. There is distention of the stomach and a colonic loop. This may reflect mild ileus. Evaluation for free intra-abdominal air  is limited on a single supine view, though no definite free air is seen. Stool is noted within the sigmoid colon. Clips are noted within the right upper quadrant, reflecting prior cholecystectomy. And AICD lead is noted ending overlying the right ventricle. Mild right basilar airspace opacity is seen. IMPRESSION: 1. Enteric tube noted ending overlying the body of the stomach. 2. Distention of the stomach and colonic loops may reflect mild ileus. No definite free intra-abdominal air seen, though evaluation for free air is limited on a single supine view. Stool noted within the sigmoid colon. 3. Mild right basilar airspace opacity is concerning for pneumonia. Electronically Signed   By: Garald Balding M.D.   On: 06/10/2015 20:20    ____________________________________________   PROCEDURES  Procedure(s) performed: intubation, central line, see procedure note(s).   INTUBATION Performed by: Hinda Kehr  Required items: required blood products, implants, devices, and special equipment available Patient identity confirmed: provided demographic data and hospital-assigned identification number Time out: Immediately prior to procedure a "time out" was called  to verify the correct patient, procedure, equipment, support staff and site/side marked as required.  Indications: acute respiratory failure  Intubation method: Glidescope Laryngoscopy   Preoxygenation: BVM  Sedatives: Etomidate 10 mg IV Paralytic: rocuronium 100 mg IV Tube Size: 7.0 cuffed  Post-procedure assessment: chest rise and ETCO2 monitor Breath sounds: equal and absent over the epigastrium Tube secured with: ETT holder Chest x-ray interpreted by radiologist and me.  Chest x-ray findings: endotracheal tube in appropriate position  Patient tolerated the procedure well with no immediate complications.   CENTRAL LINE Performed by: Hinda Kehr Consent: The procedure was performed in an emergent situation. Required items: required blood products, implants, devices, and special equipment available Patient identity confirmed: arm band and provided demographic data Time out: Immediately prior to procedure a "time out" was called to verify the correct patient, procedure, equipment, support staff and site/side marked as required. Indications: vascular access Patient sedated: intubated, maintained on propofol Preparation: skin prepped with 2% chlorhexidine Skin prep agent dried: skin prep agent completely dried prior to procedure Sterile barriers: all five maximum sterile barriers used - cap, mask, sterile gown, sterile gloves, and large sterile sheet Hand hygiene: hand hygiene performed prior to central venous catheter insertion  Location details: left IJ (due to patient positioning on the vent, as opposed to right IJ placement)  Catheter type: triple lumen Catheter size: 7 Fr Pre-procedure: landmarks identified Ultrasound guidance: yes Successful placement: yes Post-procedure: line sutured and dressing applied Assessment: blood return through all parts, free fluid flow, placement verified by x-ray and no pneumothorax on x-ray Patient tolerance: Patient tolerated the  procedure well with no immediate complications.   CRITICAL CARE Performed by: Hinda Kehr   Total critical care time: 60 minutes minutes  Critical care time was exclusive of separately billable procedures and treating other patients.  Critical care was necessary to treat or prevent imminent or life-threatening deterioration.  Critical care was time spent personally by me on the following activities: development of treatment plan with patient and/or surrogate as well as nursing, discussions with consultants, evaluation of patient's response to treatment, examination of patient, obtaining history from patient or surrogate, ordering and performing treatments and interventions, ordering and review of laboratory studies, ordering and review of radiographic studies, pulse oximetry and re-evaluation of patient's condition.  ____________________________________________   INITIAL IMPRESSION / ASSESSMENT AND PLAN / ED COURSE  Pertinent labs & imaging results that were available during my care of the patient were  reviewed by me and considered in my medical decision making (see chart for details).  The patient was clearly critically ill upon arrival.  She was normotensive with tachycardia in the 120s.  I secured her airway immediately with a successful an uncomplicated intubation as documented above.  I placed a right EJ and we placed a right humerus IO because she had no other available IV access.  We subsequently had problems with the humeral line and no longer working and at that point when it was clear she was going to need better access I placed a left IJ as documented above.  During the time the patient was under my care in the emergency department her blood pressure dropped slightly on propofol and the propofol was eventually discontinued but she remains unresponsive.  She received 30 mL/kg normal saline per sepsis protocol.  Her lactate is greater than 6.  She has multisystem organ failure.  We  gave empiric early antibiotics for broad-spectrum coverage but presumed healthcare associated pneumonia.  I ordered CT scans of her head and her abdomen given the slightly abnormal abdominal x-ray finding and the concern that she may have an cranial event, but these were necessarily delayed due to stabilization of the patient.  The patient's daughter was able to provide additional history I had an extensive talk about the patient's poor prognosis with her.  I did verify that the patient has no DO NOT RESUSCITATE/DO NOT RESUSCITATE order in place although I did encourage the daughter to consider this.  The patient has no health care prior turning.  The daughter reported that the patient has been a little bit ill over the last couple days but an essentially her normal state of health.  This morning she was walking around and drinking her coffee.  She seemed to be more sleepy than usual and by the middle of the afternoon she was minimally responsive which is when the daughter called EMS.  I shared with the daughter the patient's poor prognosis and I have admitted the patient to the hospitalist, Dr. Jannifer Franklin, for further management and the ICU.  At this time the patient's CT scans are still pending but is unlikely to change the outcome significantly and both I and Dr. Jannifer Franklin have talked daughter about the possibility of moving the patient comfort care based on her poor prognosis.  ____________________________________________  FINAL CLINICAL IMPRESSION(S) / ED DIAGNOSES  Final diagnoses:  Acute respiratory failure with hypoxia and hypercarbia (HCC)  HCAP (healthcare-associated pneumonia)  Sepsis, due to unspecified organism [facility])  Multisystem organ failure      NEW MEDICATIONS STARTED DURING THIS VISIT:  New Prescriptions   No medications on file     Hinda Kehr, MD 06/10/15 2322

## 2015-06-10 NOTE — Progress Notes (Signed)
ANTIBIOTIC CONSULT NOTE - INITIAL  Pharmacy Consult for vancomycin/Zosyn Indication: pneumonia  Allergies  Allergen Reactions  . Nsaids Nausea And Vomiting and Other (See Comments)    Reaction:  GI distress and burning     Patient Measurements: Height: 5\' 5"  (165.1 cm) Weight: 113 lb (51.256 kg) IBW/kg (Calculated) : 57 Adjusted Body Weight:   Vital Signs: Temp: 95 F (35 C) (11/11 2200) BP: 89/49 mmHg (11/11 2230) Pulse Rate: 80 (11/11 2230) Intake/Output from previous day:   Intake/Output from this shift:    Labs:  Recent Labs  06/10/15 1930  WBC 21.1*  HGB 10.2*  PLT 219  CREATININE 1.71*   Estimated Creatinine Clearance: 25.5 mL/min (by C-G formula based on Cr of 1.71). No results for input(s): VANCOTROUGH, VANCOPEAK, VANCORANDOM, GENTTROUGH, GENTPEAK, GENTRANDOM, TOBRATROUGH, TOBRAPEAK, TOBRARND, AMIKACINPEAK, AMIKACINTROU, AMIKACIN in the last 72 hours.   Microbiology: Recent Results (from the past 720 hour(s))  Culture, blood (routine x 2)     Status: None   Collection Time: 05/21/15 10:49 PM  Result Value Ref Range Status   Specimen Description BLOOD RIGHT HAND  Final   Special Requests BOTTLES DRAWN AEROBIC AND ANAEROBIC 4CC  Final   Culture NO GROWTH 5 DAYS  Final   Report Status 05/26/2015 FINAL  Final  Culture, blood (routine x 2)     Status: None   Collection Time: 05/21/15 11:00 PM  Result Value Ref Range Status   Specimen Description BLOOD LEFT ASSIST CONTROL  Final   Special Requests BOTTLES DRAWN AEROBIC AND ANAEROBIC 4CC  Final   Culture NO GROWTH 5 DAYS  Final   Report Status 05/26/2015 FINAL  Final  MRSA PCR Screening     Status: None   Collection Time: 05/22/15  1:46 AM  Result Value Ref Range Status   MRSA by PCR NEGATIVE NEGATIVE Final    Comment:        The GeneXpert MRSA Assay (FDA approved for NASAL specimens only), is one component of a comprehensive MRSA colonization surveillance program. It is not intended to diagnose  MRSA infection nor to guide or monitor treatment for MRSA infections.     Medical History: Past Medical History  Diagnosis Date  . COPD (chronic obstructive pulmonary disease) ([place])   . Hypertension   . Ischemic cardiomyopathy     a.   . Coronary artery disease     a. s/p multiple stenting in 2005  . History of ventricular tachycardia   . PVD (peripheral vascular disease) ([place])   . PAD (peripheral artery disease) ([place])   . HLD (hyperlipidemia)   . Chronic back pain   . MI, old   . MRSA (methicillin resistant staph aureus) culture positive     left foot wound  . CHF (congestive heart failure) ([place])   . Pneumonia   . Asthma   . Cancer ([place])     Medications:  Infusions:  . sodium chloride 150 mL/hr at 06/10/15 2302  . azithromycin 500 mg (06/10/15 2303)  . norepinephrine    . piperacillin-tazobactam Stopped (06/10/15 2132)   Assessment: 69 yof cc CP with CXR showing right basilar airspace opacity concerning for PNA. Starting broad spectrum ABX.   Vd 35.9 L, Ke 0.026 hr-1, T1/2 27 hr  Goal of Therapy:  Vancomycin trough level 15-20 mcg/ml  Plan:  Expected duration 7 days with resolution of temperature and/or normalization of WBC. Zosyn 3.375 gm IV Q8H EI and vancomycin 500 mg IV Q24H received 1 gm in ED will  not use stacked doses. Pharmacy will continue to follow and adjust as needed to maintain trough 15 to 20 mcg/mL. Expect need for adjustment and worsening renal function.   Laural Benes, Pharm.D.  Clinical Pharmacist 06/10/2015,11:05 PM

## 2015-06-10 NOTE — ED Notes (Addendum)
Pt from home initial call was unresponsive, EMS arrived, CBG 29, I/O placed rt shoulder, 1 amp D50 given, EMS BVM.  2 mg Narcan, given per EMS.

## 2015-06-11 ENCOUNTER — Inpatient Hospital Stay: Payer: Medicare Other

## 2015-06-11 DIAGNOSIS — L8990 Pressure ulcer of unspecified site, unspecified stage: Secondary | ICD-10-CM | POA: Insufficient documentation

## 2015-06-11 DIAGNOSIS — J9621 Acute and chronic respiratory failure with hypoxia: Secondary | ICD-10-CM

## 2015-06-11 DIAGNOSIS — K55059 Acute (reversible) ischemia of intestine, part and extent unspecified: Secondary | ICD-10-CM

## 2015-06-11 DIAGNOSIS — R69 Illness, unspecified: Secondary | ICD-10-CM

## 2015-06-11 DIAGNOSIS — R579 Shock, unspecified: Secondary | ICD-10-CM

## 2015-06-11 LAB — BLOOD GAS, ARTERIAL
ACID-BASE DEFICIT: 3.9 mmol/L — AB (ref 0.0–2.0)
Allens test (pass/fail): POSITIVE — AB
BICARBONATE: 23.8 meq/L (ref 21.0–28.0)
FIO2: 0.6
O2 Saturation: 98.9 %
PCO2 ART: 53 mmHg — AB (ref 32.0–48.0)
PEEP: 10 cmH2O
PH ART: 7.26 — AB (ref 7.350–7.450)
Patient temperature: 37
RATE: 16 resp/min
VT: 350 mL
pO2, Arterial: 146 mmHg — ABNORMAL HIGH (ref 83.0–108.0)

## 2015-06-11 LAB — MRSA PCR SCREENING: MRSA BY PCR: POSITIVE — AB

## 2015-06-11 LAB — APTT: aPTT: 30 seconds (ref 24–36)

## 2015-06-11 LAB — COMPREHENSIVE METABOLIC PANEL
ALBUMIN: 2.5 g/dL — AB (ref 3.5–5.0)
ALK PHOS: 379 U/L — AB (ref 38–126)
ALT: 2215 U/L — AB (ref 14–54)
Anion gap: 8 (ref 5–15)
BUN: 29 mg/dL — ABNORMAL HIGH (ref 6–20)
CALCIUM: 7.4 mg/dL — AB (ref 8.9–10.3)
CHLORIDE: 104 mmol/L (ref 101–111)
CO2: 27 mmol/L (ref 22–32)
CREATININE: 1.63 mg/dL — AB (ref 0.44–1.00)
GFR calc non Af Amer: 31 mL/min — ABNORMAL LOW (ref >60)
GFR, EST AFRICAN AMERICAN: 36 mL/min — AB (ref >60)
GLUCOSE: 260 mg/dL — AB (ref 65–99)
Potassium: 5 mmol/L (ref 3.5–5.1)
SODIUM: 139 mmol/L (ref 135–145)
Total Bilirubin: 1.4 mg/dL — ABNORMAL HIGH (ref 0.3–1.2)
Total Protein: 5.2 g/dL — ABNORMAL LOW (ref 6.5–8.1)

## 2015-06-11 LAB — PROTIME-INR
INR: 1.93
Prothrombin Time: 22 seconds — ABNORMAL HIGH (ref 11.4–15.0)

## 2015-06-11 LAB — EXPECTORATED SPUTUM ASSESSMENT W GRAM STAIN, RFLX TO RESP C

## 2015-06-11 LAB — CBC
HCT: 36.7 % (ref 35.0–47.0)
HEMOGLOBIN: 10.9 g/dL — AB (ref 12.0–16.0)
MCH: 20.3 pg — ABNORMAL LOW (ref 26.0–34.0)
MCHC: 29.6 g/dL — ABNORMAL LOW (ref 32.0–36.0)
MCV: 68.5 fL — ABNORMAL LOW (ref 80.0–100.0)
PLATELETS: 211 10*3/uL (ref 150–440)
RBC: 5.37 MIL/uL — AB (ref 3.80–5.20)
RDW: 22.3 % — ABNORMAL HIGH (ref 11.5–14.5)
WBC: 26.4 10*3/uL — AB (ref 3.6–11.0)

## 2015-06-11 LAB — TROPONIN I
Troponin I: 8.37 ng/mL — ABNORMAL HIGH (ref <0.031)
Troponin I: 17.69 ng/mL — ABNORMAL HIGH (ref <0.031)
Troponin I: 15.54 ng/mL — ABNORMAL HIGH (ref <0.031)

## 2015-06-11 LAB — EXPECTORATED SPUTUM ASSESSMENT W REFEX TO RESP CULTURE

## 2015-06-11 LAB — PHOSPHORUS: PHOSPHORUS: 6.6 mg/dL — AB (ref 2.5–4.6)

## 2015-06-11 LAB — GLUCOSE, CAPILLARY: GLUCOSE-CAPILLARY: 126 mg/dL — AB (ref 65–99)

## 2015-06-11 LAB — MAGNESIUM: MAGNESIUM: 1.6 mg/dL — AB (ref 1.7–2.4)

## 2015-06-11 LAB — HEPARIN LEVEL (UNFRACTIONATED)

## 2015-06-11 LAB — LACTIC ACID, PLASMA: Lactic Acid, Venous: 1.8 mmol/L (ref 0.5–2.0)

## 2015-06-11 LAB — TRIGLYCERIDES: TRIGLYCERIDES: 95 mg/dL (ref <150)

## 2015-06-11 MED ORDER — SODIUM CHLORIDE 0.9 % IV BOLUS (SEPSIS)
1000.0000 mL | Freq: Once | INTRAVENOUS | Status: AC
  Administered 2015-06-11: 1000 mL via INTRAVENOUS

## 2015-06-11 MED ORDER — ANTISEPTIC ORAL RINSE SOLUTION (CORINZ)
7.0000 mL | Freq: Four times a day (QID) | OROMUCOSAL | Status: DC
  Administered 2015-06-11 – 2015-06-19 (×32): 7 mL via OROMUCOSAL
  Filled 2015-06-11 (×35): qty 7

## 2015-06-11 MED ORDER — PIPERACILLIN-TAZOBACTAM 3.375 G IVPB
3.3750 g | Freq: Three times a day (TID) | INTRAVENOUS | Status: DC
  Administered 2015-06-11 – 2015-06-12 (×4): 3.375 g via INTRAVENOUS
  Filled 2015-06-11 (×6): qty 50

## 2015-06-11 MED ORDER — HEPARIN (PORCINE) IN NACL 100-0.45 UNIT/ML-% IJ SOLN
12.0000 [IU]/kg/h | INTRAMUSCULAR | Status: DC
  Administered 2015-06-11: 12 [IU]/kg/h via INTRAVENOUS
  Filled 2015-06-11: qty 250

## 2015-06-11 MED ORDER — HEPARIN SODIUM (PORCINE) 5000 UNIT/ML IJ SOLN: 5000.0000 [IU] | Freq: Three times a day (TID) | INTRAMUSCULAR | Status: DC

## 2015-06-11 MED ORDER — VANCOMYCIN HCL 500 MG IV SOLR
500.0000 mg | INTRAVENOUS | Status: DC
  Administered 2015-06-11: 500 mg via INTRAVENOUS
  Filled 2015-06-11 (×2): qty 500

## 2015-06-11 MED ORDER — FENTANYL 2500MCG IN NS 250ML (10MCG/ML) PREMIX INFUSION
0.0000 ug/h | INTRAVENOUS | Status: DC
  Administered 2015-06-11: 20 ug/h via INTRAVENOUS
  Administered 2015-06-12: 200 ug/h via INTRAVENOUS
  Administered 2015-06-12 (×2): 150 ug/h via INTRAVENOUS
  Administered 2015-06-14: 400 ug/h via INTRAVENOUS
  Administered 2015-06-14: 200 ug/h via INTRAVENOUS
  Administered 2015-06-14: 350 ug/h via INTRAVENOUS
  Administered 2015-06-15: 300 ug/h via INTRAVENOUS
  Administered 2015-06-15: 200 ug/h via INTRAVENOUS
  Administered 2015-06-15: 400 ug/h via INTRAVENOUS
  Administered 2015-06-16: 350 ug/h via INTRAVENOUS
  Administered 2015-06-16: 400 ug/h via INTRAVENOUS
  Administered 2015-06-16: 350 ug/h via INTRAVENOUS
  Administered 2015-06-17: 400 ug/h via INTRAVENOUS
  Administered 2015-06-17 (×2): 350 ug/h via INTRAVENOUS
  Administered 2015-06-18: 400 ug/h via INTRAVENOUS
  Administered 2015-06-18: 100 ug/h via INTRAVENOUS
  Administered 2015-06-18 – 2015-06-19 (×6): 400 ug/h via INTRAVENOUS
  Filled 2015-06-11 (×24): qty 250

## 2015-06-11 MED ORDER — HYDROCORTISONE NA SUCCINATE PF 100 MG IJ SOLR
50.0000 mg | Freq: Three times a day (TID) | INTRAMUSCULAR | Status: DC
  Administered 2015-06-11 – 2015-06-14 (×10): 50 mg via INTRAVENOUS
  Filled 2015-06-11 (×10): qty 2

## 2015-06-11 MED ORDER — ARTIFICIAL TEARS OP OINT: TOPICAL_OINTMENT | OPHTHALMIC | Status: DC | PRN

## 2015-06-11 MED ORDER — ACETAMINOPHEN 325 MG PO TABS
650.0000 mg | ORAL_TABLET | Freq: Four times a day (QID) | ORAL | Status: DC | PRN
Start: 2015-06-11 — End: 2015-07-05
  Administered 2015-07-03: 650 mg via ORAL
  Filled 2015-06-11: qty 2

## 2015-06-11 MED ORDER — DEXTROSE-NACL 5-0.45 % IV SOLN
INTRAVENOUS | Status: DC
  Administered 2015-06-11: 12:00:00 via INTRAVENOUS

## 2015-06-11 MED ORDER — CHLORHEXIDINE GLUCONATE CLOTH 2 % EX PADS
6.0000 | MEDICATED_PAD | Freq: Every day | CUTANEOUS | Status: AC
  Administered 2015-06-14 – 2015-06-15 (×2): 6 via TOPICAL

## 2015-06-11 MED ORDER — CHLORHEXIDINE GLUCONATE 0.12% ORAL RINSE (MEDLINE KIT)
15.0000 mL | Freq: Two times a day (BID) | OROMUCOSAL | Status: DC
  Administered 2015-06-11: 15 mL via OROMUCOSAL
  Filled 2015-06-11 (×3): qty 15

## 2015-06-11 MED ORDER — METHYLPREDNISOLONE SODIUM SUCC 125 MG IJ SOLR
60.0000 mg | Freq: Four times a day (QID) | INTRAMUSCULAR | Status: DC
  Administered 2015-06-11 (×2): 60 mg via INTRAVENOUS
  Filled 2015-06-11 (×2): qty 2

## 2015-06-11 MED ORDER — CHLORHEXIDINE GLUCONATE 0.12% ORAL RINSE (MEDLINE KIT)
15.0000 mL | Freq: Two times a day (BID) | OROMUCOSAL | Status: DC
  Administered 2015-06-11 – 2015-06-19 (×17): 15 mL via OROMUCOSAL
  Filled 2015-06-11 (×18): qty 15

## 2015-06-11 MED ORDER — ACETAMINOPHEN 650 MG RE SUPP: 650.0000 mg | Freq: Four times a day (QID) | RECTAL | Status: DC | PRN

## 2015-06-11 MED ORDER — PHYTONADIONE 5 MG PO TABS
5.0000 mg | ORAL_TABLET | Freq: Every day | ORAL | Status: DC
  Administered 2015-06-11 – 2015-06-13 (×3): 5 mg
  Filled 2015-06-11 (×2): qty 1

## 2015-06-11 MED ORDER — BUDESONIDE 0.25 MG/2ML IN SUSP
0.2500 mg | Freq: Two times a day (BID) | RESPIRATORY_TRACT | Status: DC
  Administered 2015-06-11 – 2015-07-05 (×41): 0.25 mg via RESPIRATORY_TRACT
  Filled 2015-06-11 (×45): qty 2

## 2015-06-11 MED ORDER — IPRATROPIUM-ALBUTEROL 0.5-2.5 (3) MG/3ML IN SOLN
3.0000 mL | Freq: Four times a day (QID) | RESPIRATORY_TRACT | Status: DC
  Administered 2015-06-11 – 2015-06-23 (×44): 3 mL via RESPIRATORY_TRACT
  Filled 2015-06-11 (×49): qty 3

## 2015-06-11 MED ORDER — POLYVINYL ALCOHOL 1.4 % OP SOLN
1.0000 [drp] | OPHTHALMIC | Status: DC | PRN
  Filled 2015-06-11: qty 15

## 2015-06-11 MED ORDER — HEPARIN (PORCINE) IN NACL 100-0.45 UNIT/ML-% IJ SOLN
600.0000 [IU]/h | INTRAMUSCULAR | Status: DC
  Administered 2015-06-11: 600 [IU]/h via INTRAVENOUS
  Filled 2015-06-11: qty 250

## 2015-06-11 MED ORDER — PROPOFOL 1000 MG/100ML IV EMUL: 5.0000 ug/kg/min | INTRAVENOUS | Status: DC

## 2015-06-11 MED ORDER — HEPARIN BOLUS VIA INFUSION
3000.0000 [IU] | Freq: Once | INTRAVENOUS | Status: AC
  Administered 2015-06-11: 3000 [IU] via INTRAVENOUS
  Filled 2015-06-11: qty 3000

## 2015-06-11 MED ORDER — PROPOFOL 1000 MG/100ML IV EMUL
0.0000 ug/kg/min | INTRAVENOUS | Status: DC
  Administered 2015-06-11: 20 ug/kg/min via INTRAVENOUS

## 2015-06-11 MED ORDER — HEPARIN SODIUM (PORCINE) 5000 UNIT/ML IJ SOLN
5000.0000 [IU] | Freq: Three times a day (TID) | INTRAMUSCULAR | Status: DC
  Filled 2015-06-11: qty 1

## 2015-06-11 MED ORDER — TIOTROPIUM BROMIDE MONOHYDRATE 18 MCG IN CAPS
18.0000 ug | ORAL_CAPSULE | Freq: Every day | RESPIRATORY_TRACT | Status: DC
  Filled 2015-06-11: qty 5

## 2015-06-11 MED ORDER — ASPIRIN 81 MG PO CHEW
81.0000 mg | CHEWABLE_TABLET | Freq: Every day | ORAL | Status: DC
  Administered 2015-06-11 – 2015-06-20 (×10): 81 mg
  Filled 2015-06-11 (×10): qty 1

## 2015-06-11 MED ORDER — PANTOPRAZOLE SODIUM 40 MG IV SOLR
40.0000 mg | INTRAVENOUS | Status: DC
  Administered 2015-06-11 – 2015-06-20 (×10): 40 mg via INTRAVENOUS
  Filled 2015-06-11 (×10): qty 40

## 2015-06-11 MED ORDER — SODIUM CHLORIDE 0.9 % IV SOLN
INTRAVENOUS | Status: DC
  Administered 2015-06-11: 18:00:00 via INTRAVENOUS

## 2015-06-11 MED ORDER — HEPARIN BOLUS VIA INFUSION
2000.0000 [IU] | Freq: Once | INTRAVENOUS | Status: AC
  Administered 2015-06-11: 2000 [IU] via INTRAVENOUS
  Filled 2015-06-11: qty 2000

## 2015-06-11 MED ORDER — ANTISEPTIC ORAL RINSE SOLUTION (CORINZ)
7.0000 mL | Freq: Four times a day (QID) | OROMUCOSAL | Status: DC
  Administered 2015-06-11 (×2): 7 mL via OROMUCOSAL
  Filled 2015-06-11 (×5): qty 7

## 2015-06-11 MED ORDER — MUPIROCIN 2 % EX OINT
1.0000 "application " | TOPICAL_OINTMENT | Freq: Two times a day (BID) | CUTANEOUS | Status: AC
  Administered 2015-06-11 – 2015-06-15 (×10): 1 via NASAL
  Filled 2015-06-11: qty 22

## 2015-06-11 MED ORDER — INSULIN ASPART 100 UNIT/ML ~~LOC~~ SOLN
0.0000 [IU] | SUBCUTANEOUS | Status: DC
  Administered 2015-06-12 – 2015-06-13 (×7): 2 [IU] via SUBCUTANEOUS
  Administered 2015-06-15 – 2015-06-17 (×2): 3 [IU] via SUBCUTANEOUS
  Administered 2015-06-17 – 2015-06-18 (×2): 2 [IU] via SUBCUTANEOUS
  Administered 2015-06-18: 3 [IU] via SUBCUTANEOUS
  Administered 2015-06-19 (×2): 2 [IU] via SUBCUTANEOUS
  Filled 2015-06-11 (×7): qty 2
  Filled 2015-06-11: qty 3
  Filled 2015-06-11 (×2): qty 2
  Filled 2015-06-11: qty 3
  Filled 2015-06-11 (×3): qty 2

## 2015-06-11 NOTE — Progress Notes (Signed)
ANTICOAGULATION CONSULT NOTE - Initial Consult  Pharmacy Consult for heparin Indication: chest pain/ACS  Allergies  Allergen Reactions  . Nsaids Nausea And Vomiting and Other (See Comments)    Reaction:  GI distress and burning     Patient Measurements: Height: 5\' 5"  (165.1 cm) Weight: 108 lb 11 oz (49.3 kg) IBW/kg (Calculated) : 57 Heparin Dosing Weight: 49.3 kg  Vital Signs: Temp: 95 F (35 C) (11/11 2215) BP: 114/68 mmHg (11/12 0230) Pulse Rate: 95 (11/12 0230)  Labs:  Recent Labs  06/10/15 1930 06/11/15 0229  HGB 10.2* 10.9*  HCT 35.3 36.7  PLT 219 211  APTT 41*  --   LABPROT 24.7*  --   INR 2.26  --   CREATININE 1.71* 1.63*  TROPONINI 0.76* 8.37*    Estimated Creatinine Clearance: 25.7 mL/min (by C-G formula based on Cr of 1.63).   Medical History: Past Medical History  Diagnosis Date  . COPD (chronic obstructive pulmonary disease) ([place])   . Hypertension   . Ischemic cardiomyopathy     a.   . Coronary artery disease     a. s/p multiple stenting in 2005  . History of ventricular tachycardia   . PVD (peripheral vascular disease) ([place])   . PAD (peripheral artery disease) ([place])   . HLD (hyperlipidemia)   . Chronic back pain   . MI, old   . MRSA (methicillin resistant staph aureus) culture positive     left foot wound  . CHF (congestive heart failure) ([place])   . Pneumonia   . Asthma   . Cancer ([place])     Medications:  Infusions:  . sodium chloride 150 mL/hr at 06/10/15 2302  . heparin    . norepinephrine Stopped (06/11/15 0231)  . propofol (DIPRIVAN) infusion 25 mcg/kg/min (06/11/15 0215)    Assessment: 69 yof cc respiratory arrest found unresponsive by daughter. ED found hypoxia and hypotension, CXR shows PNA, on lab eval multiorgan disfunction with elevated transaminases, elevated troponin, elevated creatinine and lactic acid. PH was 7.19 on ABG. Starting heparin infusion for positive troponin.   Goal of Therapy:  Heparin level 0.3-0.7  units/ml Monitor platelets by anticoagulation protocol: Yes   Plan:  Give 3000 units bolus x 1 Start heparin infusion at 600 units/hr Check anti-Xa level in 8 hours and daily while on heparin Continue to monitor H&H and platelets  Laural Benes, Pharm.D.  Clinical Pharmacist  06/11/2015,3:53 AM

## 2015-06-11 NOTE — Progress Notes (Signed)
Initial Nutrition Assessment  DOCUMENTATION CODES:    RD notes severe malnutrition in the context of chronic illness on previous admission 2 weeks ago  INTERVENTION:   EN: if unable to extubate within 24-48 hours recommend initiation of nutrition support   NUTRITION DIAGNOSIS:   Inadequate oral intake related to inability to eat as evidenced by NPO status.  GOAL:   Patient will meet greater than or equal to 90% of their needs  MONITOR:    (Energy Intake, Pulmonary Profile, Anthropometrics, Gastrointestinal Profile, Electrolyte and renal Profile)  REASON FOR ASSESSMENT:   Ventilator    ASSESSMENT:   Pt admitted after being found unresponsive by family. Pt currently sedated on the vent secondary to septic shock with acute on chronic respiratory failure. Pt scheduled for CT of head this am. Per Nsg note, pt unarousable with sedation off and unresponsive to pain.  Past Medical History  Diagnosis Date  . COPD (chronic obstructive pulmonary disease) ([place])   . Hypertension   . Ischemic cardiomyopathy     a.   . Coronary artery disease     a. s/p multiple stenting in 2005  . History of ventricular tachycardia   . PVD (peripheral vascular disease) ([place])   . PAD (peripheral artery disease) ([place])   . HLD (hyperlipidemia)   . Chronic back pain   . MI, old   . MRSA (methicillin resistant staph aureus) culture positive     left foot wound  . CHF (congestive heart failure) ([place])   . Pneumonia   . Asthma   . Cancer [facility])     Diet Order:    NPO   Current Nutrition: Pt currently NPO on vent.  Food/Nutrition-Related History: Pt known to RD from previous admissions, most recently 2 weeks ago. From previous encounters pt with poor po intake, has been days without eating, however was eating well during last admission.  Pt enjoys Ensure to which she has received when admitted.    Scheduled Medications:  . antiseptic oral rinse  7 mL Mouth Rinse QID  . azithromycin  500 mg  Intravenous Q24H  . budesonide  0.25 mg Nebulization BID  . Chlorhexidine Gluconate Cloth  6 each Topical Q0600  . heparin subcutaneous  5,000 Units Subcutaneous 3 times per day  . hydrocortisone sod succinate (SOLU-CORTEF) inj  50 mg Intravenous Q8H  . ipratropium-albuterol  3 mL Nebulization Q6H  . mupirocin ointment  1 application Nasal BID  . pantoprazole (PROTONIX) IV  40 mg Intravenous Q24H  . piperacillin-tazobactam (ZOSYN)  IV  3.375 g Intravenous 3 times per day  . vancomycin  500 mg Intravenous Q24H    Continuous Medications:  . dextrose 5 % and 0.45% NaCl 100 mL/hr at 06/11/15 1223  . propofol (DIPRIVAN) infusion Stopped (06/11/15 0833)   D5 0.45%NS providing 408kcals in 24 hours   Electrolyte/Renal Profile and Glucose Profile:   Recent Labs Lab 06/10/15 1930 06/11/15 0229  NA 137 139  K 5.4* 5.0  CL 98* 104  CO2 24 27  BUN 21* 29*  CREATININE 1.71* 1.63*  CALCIUM 8.1* 7.4*  MG  --  1.6*  PHOS  --  6.6*  GLUCOSE 216* 260*   Protein Profile:  Recent Labs Lab 06/10/15 1930 06/11/15 0229  ALBUMIN 2.6* 2.5*   Hepatic Function Latest Ref Rng 06/11/2015 06/10/2015 04/07/2015  Total Protein 6.5 - 8.1 g/dL 5.2(L) 5.8(L) 6.5  Albumin 3.5 - 5.0 g/dL 2.5(L) 2.6(L) 3.0(L)  AST 15 - 41 U/L >2275(H) 1138(H) 38  ALT 14 - 54 U/L 2215(H) 643(H) 24  Alk Phosphatase 38 - 126 U/L 379(H) 373(H) 109  Total Bilirubin 0.3 - 1.2 mg/dL 1.4(H) 1.2 0.9   Lipase     Component Value Date/Time   LIPASE 46 06/10/2015 1930   Nutritional Anemia Profile:  CBC Latest Ref Rng 06/11/2015 06/10/2015 05/24/2015  WBC 3.6 - 11.0 K/uL 26.4(H) 21.1(H) 5.9  Hemoglobin 12.0 - 16.0 g/dL 10.9(L) 10.2(L) 9.5(L)  Hematocrit 35.0 - 47.0 % 36.7 35.3 32.6(L)  Platelets 150 - 440 K/uL 211 219 188    Gastrointestinal Profile: hypoactive BS Last BM: unknown   Nutrition-Focused Physical Exam Findings:  Unable to complete Nutrition-Focused physical exam at this time. However on previous  admissions, including 2 weeks ago, pt with severe muscle wasting and severe subcutaneous fat losses.    Weight Change: Unable to clarify with pt, per [facility] weight gain from last admission.    Skin:   Stage I sacral pressure ulcer noted   Height:   Ht Readings from Last 1 Encounters:  06/11/15 _0  (1.575 m)    Weight:   Wt Readings from Last 1 Encounters:  06/11/15 108 lb 11 oz (49.3 kg)   Wt Readings from Last 10 Encounters:  06/11/15 108 lb 11 oz (49.3 kg)  05/21/15 90 lb (40.824 kg)  04/08/15 117 lb 1.6 oz (53.116 kg)  02/10/15 101 lb 1.6 oz (45.859 kg)  02/03/15 98 lb 12.8 oz (44.815 kg)  01/22/15 98 lb 11.2 oz (44.77 kg)  01/16/15 98 lb 14.4 oz (44.861 kg)  01/09/15 90 lb (40.824 kg)  12/30/14 97 lb 9.6 oz (44.271 kg)  12/16/14 89 lb 14.4 oz (40.778 kg)     BMI:  Body mass index is 19.87 kg/(m^2).  Estimated Nutritional Needs:   Kcal:  will assess once pt on vent  >24 hours  Protein:  74-98g protein (1.5-2.0g/kg)  Fluid:  1225-1481m of fluid (25-358mkg)  EDUCATION NEEDS:   Education needs no appropriate at this time   HIFranklinRD, LDN Pager [phone]

## 2015-06-11 NOTE — Consult Note (Signed)
[facility] Cardiology  CARDIOLOGY CONSULT NOTE  Patient ID: Lynn Butler MRN: [id number] DOB/AGE: 1946/02/02 69 y.o.  Admit date: 06/10/2015 Referring Physician [place] Primary Physician [facility] Primary Cardiologist Remus Blake Reason for Consultation non-ST elevation myocardial infarction  HPI: 69 year old female referred for non-ST elevation myocardial infarction. Asian found unresponsive by her daughter, brought to [facility] emergency room where the patient was found to be hypoxic, and hypotensive. She intubated, needed to the CCU where she has remained minimally responsive. The patient has ruled in for non-ST elevation myocardial infarction. She has known coronary artery disease, status post prior coronary stents. Recent cardiac catheterization 01/14/15 revealed occluded distal RCA, and diffusely diseased left circumflex just felt to be not amenable to revascularization. She has known severe left ventricular dysfunction, echocardiogram 02/01/15 revealed LV ejection fraction of 15-25%. Chest x-ray reveals evidence for possible right pneumonia. White count markedly elevated to 26,400, consistent with sepsis in the setting of non-ST elevation myocardial infarction. The patient has multisystem organ failure with shock liver with markedly elevated LFTs.  Review of systems complete and found to be negative unless listed above     Past Medical History  Diagnosis Date  . COPD (chronic obstructive pulmonary disease) ([place])   . Hypertension   . Ischemic cardiomyopathy     a.   . Coronary artery disease     a. s/p multiple stenting in 2005  . History of ventricular tachycardia   . PVD (peripheral vascular disease) ([place])   . PAD (peripheral artery disease) ([place])   . HLD (hyperlipidemia)   . Chronic back pain   . MI, old   . MRSA (methicillin resistant staph aureus) culture positive     left foot wound  . CHF (congestive heart failure) ([place])   . Pneumonia   . Asthma   . Cancer [facility])     Past  Surgical History  Procedure Laterality Date  . Insert / replace / remove pacemaker    . Vascular bypass surgery Bilateral   . Cardiac catheterization N/A 01/14/2015    Procedure: Left Heart Cath;  Surgeon: Wellington Hampshire, MD;  Location: [place] [facility];  Service: Cardiovascular;  Laterality: N/A;  . Cardiac defibrillator placement    . Appendectomy    . Cesarean section    . Abdominal surgery      Prescriptions prior to admission  Medication Sig Dispense Refill Last Dose  . albuterol (PROVENTIL HFA;VENTOLIN HFA) 108 (90 BASE) MCG/ACT inhaler Inhale 2 puffs into the lungs every 4 (four) hours as needed for wheezing or shortness of breath.   PRN at PRN  . atorvastatin (LIPITOR) 40 MG tablet Take 1 tablet (40 mg total) by mouth daily at 6 PM. 30 tablet 0 unknown at unknown  . benzonatate (TESSALON) 200 MG capsule Take 1 capsule (200 mg total) by mouth 3 (three) times daily as needed for cough. 20 capsule 0 PRN at PRN  . carvedilol (COREG) 3.125 MG tablet Take 1 tablet (3.125 mg total) by mouth 2 (two) times daily with a meal. 60 tablet 2 unknown at unknown  . collagenase (SANTYL) ointment Apply 1 application topically daily.   unknown at unknown   . diazepam (VALIUM) 10 MG tablet Take 1 tablet (10 mg total) by mouth every 12 (twelve) hours as needed for anxiety. 30 tablet 0 PRN at PRN  . feeding supplement, ENSURE ENLIVE, (ENSURE ENLIVE) LIQD Take 237 mLs by mouth 3 (three) times daily with meals. 237 mL 12 unknown at unknown  .  guaiFENesin (MUCINEX) 600 MG 12 hr tablet Take 1 tablet (600 mg total) by mouth 2 (two) times daily. 20 tablet 0 unknown at unknown  . HYDROmorphone (DILAUDID) 4 MG tablet Take 1 tablet (4 mg total) by mouth every 6 (six) hours as needed for severe pain. 15 tablet 0 PRN at PRN  . ipratropium-albuterol (DUONEB) 0.5-2.5 (3) MG/3ML SOLN Take 3 mLs by nebulization every 6 (six) hours as needed. (Patient taking differently: Take 3 mLs by nebulization every 6 (six) hours  as needed (for wheezing/shortness of breath). ) 360 mL 0 PRN at PRN  . isosorbide mononitrate (IMDUR) 30 MG 24 hr tablet Take 1 tablet (30 mg total) by mouth daily. 30 tablet 0 unknown at unknown  . loperamide (IMODIUM A-D) 2 MG tablet Take 1 tablet (2 mg total) by mouth 4 (four) times daily as needed for diarrhea or loose stools. 30 tablet 0 PRN at PRN  . mometasone-formoterol (DULERA) 100-5 MCG/ACT AERO Inhale 2 puffs into the lungs 2 (two) times daily.   unknown at unknown  . OxyCODONE (OXYCONTIN) 80 mg T12A 12 hr tablet Take 1 tablet (80 mg total) by mouth every 12 (twelve) hours. 14 tablet 0 unknown at unknown  . promethazine (PHENERGAN) 25 MG tablet Take 25 mg by mouth daily as needed for nausea or vomiting.   PRN at PRN  . tiotropium (SPIRIVA) 18 MCG inhalation capsule Place 1 capsule (18 mcg total) into inhaler and inhale daily. 30 capsule 12 unknown at unknown   . zolpidem (AMBIEN) 10 MG tablet Take 5 mg by mouth at bedtime as needed for sleep.   PRN at PRN   Social History   Social History  . Marital Status: Widowed    Spouse Name: N/A  . Number of Children: N/A  . Years of Education: N/A   Occupational History  . Not on file.   Social History Main Topics  . Smoking status: Current Every Day Smoker -- 0.50 packs/day    Types: Cigarettes  . Smokeless tobacco: Never Used  . Alcohol Use: No  . Drug Use: No  . Sexual Activity: Not on file   Other Topics Concern  . Not on file   Social History Narrative    Family History  Problem Relation Age of Onset  . CAD Other   . Breast cancer Mother   . Heart disease Mother   . Coronary artery disease Father       Review of systems complete and found to be negative unless listed above      PHYSICAL EXAM  General: Well developed, well nourished, in no acute distress HEENT:  Normocephalic and atramatic Neck:  No JVD.  Lungs: Clear bilaterally to auscultation and percussion. Heart: HRRR . Normal S1 and S2 without gallops  or murmurs.  Abdomen: Bowel sounds are positive, abdomen soft and non-tender  Msk:  Back normal, normal gait. Normal strength and tone for age. Extremities: No clubbing, cyanosis or edema.   Neuro: Alert and oriented X 3. Psych:  Good affect, responds appropriately  Labs:   Lab Results  Component Value Date   WBC 26.4* 06/11/2015   HGB 10.9* 06/11/2015   HCT 36.7 06/11/2015   MCV 68.5* 06/11/2015   PLT 211 06/11/2015    Recent Labs Lab 06/11/15 0229  NA 139  K 5.0  CL 104  CO2 27  BUN 29*  CREATININE 1.63*  CALCIUM 7.4*  PROT 5.2*  BILITOT 1.4*  ALKPHOS 379*  ALT 2215*  AST >  2275*  GLUCOSE 260*   Lab Results  Component Value Date   CKTOTAL 20* 02/02/2015   CKMB 3.4 08/03/2014   TROPONINI 17.69* 06/11/2015    Lab Results  Component Value Date   CHOL 176 01/14/2015   CHOL 128 08/04/2014   Lab Results  Component Value Date   HDL 58 01/14/2015   HDL 54 08/04/2014   Lab Results  Component Value Date   LDLCALC 104* 01/14/2015   LDLCALC 63 08/04/2014   Lab Results  Component Value Date   TRIG 95 06/11/2015   TRIG 72 01/14/2015   TRIG 56 08/04/2014   Lab Results  Component Value Date   CHOLHDL 3.0 01/14/2015   No results found for: LDLDIRECT    Radiology: Ct Abdomen Pelvis Wo Contrast  06/10/2015  CLINICAL DATA:  Respiratory arrest. Abnormal abdominal radiograph with distended bowel. EXAM: CT ABDOMEN AND PELVIS WITHOUT CONTRAST TECHNIQUE: Multidetector CT imaging of the abdomen and pelvis was performed following the standard protocol without IV contrast. COMPARISON:  Radiography same day.  CT abdomen 12/28/2014. FINDINGS: There is a pleural effusion on the right. There is volume loss and likely pneumonia in the right lower lobe. Left lung bases largely clear. Mild patchy density in the lingula could represent mild pneumonia. The heart is enlarged. No pericardial fluid. The liver has a normal appearance without contrast. Previous cholecystectomy. The spleen  is at the upper limits normal in size. No abnormality of the pancreas is seen. Both kidneys contain numerous small calcifications consistent with nonobstructing stones. No evidence of active renal disease. There is atherosclerosis of the aorta and its branches but no aneurysm. 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 Maximal dimension is 5.6 cm. Nasogastric tube enters the stomach and has its tip in the distal body. The stomach is now decompressed. There are no worrisome dilated loops of intestine presently. Anastomotic bowel sutures are evident in the transverse colon. There has probably been right colectomy. There is a large amount of fecal matter in the rectosigmoid. IMPRESSION: There is no longer any distended bowel. Nasogastric tube in the stomach. No suspicion of ileus or obstruction based on the bowel gas pattern presently. Fairly large amount of stool in the rectosigmoid colon. Right pleural effusion. Probable right lower lobe pneumonia and possible lingular pneumonia. Extensive atherosclerosis. 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 No significant change since the last study when using the same measurement planes. Electronically Signed   By: Nelson Chimes M.D.   On: 06/10/2015 23:55   Dg Chest 2 View  05/21/2015  CLINICAL DATA:  69 year old female with acute chest pain, shortness of breath and cough for 1 day. EXAM: CHEST  2 VIEW COMPARISON:  04/07/2015 and prior chest radiographs FINDINGS: Cardiomegaly and left-sided AICD again noted. Mild right basilar opacity may represent atelectasis, mild airspace disease or residual opacity as previously seen. There is no evidence of pleural effusion, pneumothorax, pulmonary edema or pulmonary mass. No acute bony abnormalities are identified. IMPRESSION: Mild right basilar opacity which may  represent atelectasis, mild airspace disease/ pneumonia or residual opacity as previously identified. Cardiomegaly. Electronically Signed   By: Margarette Canada M.D.   On: 05/21/2015 19:53   Ct Head Wo Contrast  06/11/2015  CLINICAL DATA:  Found unresponsive, altered mental status, intubated EXAM: CT HEAD WITHOUT CONTRAST TECHNIQUE: Contiguous axial images were obtained from  the base of the skull through the vertex without contrast. COMPARISON:  06/10/2015 FINDINGS: Stable mild brain atrophy pattern without acute intracranial hemorrhage, mass lesion, definite infarction, hydrocephalus, midline shift, herniation, or extra-axial fluid collection. No focal mass effect or edema. Cisterns remain patent. Cerebellar atrophy as well. Atherosclerosis of the intracranial vessels of the skullbase. Mastoids remain clear. Minor scattered sinus mucosal thickening. Orbits are symmetric. IMPRESSION: Stable atrophy pattern. No interval change or acute process by noncontrast CT Electronically Signed   By: Jerilynn Mages.  Shick M.D.   On: 06/11/2015 09:20   Ct Head Wo Contrast  06/10/2015  CLINICAL DATA:  Patient found unresponsive. Respiratory arrest. Hypoglycemia. Initial encounter. EXAM: CT HEAD WITHOUT CONTRAST TECHNIQUE: Contiguous axial images were obtained from the base of the skull through the vertex without intravenous contrast. COMPARISON:  CT of the head performed 04/07/2015 FINDINGS: There is no evidence of acute infarction, mass lesion, or intra- or extra-axial hemorrhage on CT. Prominence of the ventricles and sulci suggests mild cortical volume loss. The brainstem and fourth ventricle are within normal limits. The basal ganglia are unremarkable in appearance. The cerebral hemispheres demonstrate grossly normal gray-white differentiation. No mass effect or midline shift is seen. There is no evidence of fracture; visualized osseous structures are unremarkable in appearance. The orbits are within normal limits. Mucosal thickening  is noted at the right maxillary sinus. The remaining paranasal sinuses and mastoid air cells are well-aerated. No significant soft tissue abnormalities are seen. IMPRESSION: 1. No acute intracranial pathology seen on CT. 2. Mild cortical volume loss. 3. Mucosal thickening at the right maxillary sinus. Electronically Signed   By: Garald Balding M.D.   On: 06/10/2015 23:48   Ct Angio Chest Pe W/cm &/or Wo Cm  05/21/2015  CLINICAL DATA:  Acute chest pain, shortness of breath. EXAM: CT ANGIOGRAPHY CHEST WITH CONTRAST TECHNIQUE: Multidetector CT imaging of the chest was performed using the standard protocol during bolus administration of intravenous contrast. Multiplanar CT image reconstructions and MIPs were obtained to evaluate the vascular anatomy. CONTRAST:  15mL OMNIPAQUE IOHEXOL 350 MG/ML SOLN COMPARISON:  Radiograph of same day. FINDINGS: No pneumothorax or pleural effusion is noted. Mild right posterior basilar subsegmental atelectasis is noted. 14 x 10 mm spiculated density is noted laterally in the right upper lobe with pleural tail concerning for malignancy. 11 x 10 mm density is noted anteriorly in the right lung apex concerning for malignancy. Multiple smaller other nodules are noted inferiorly in the right middle lobe which may represent metastatic disease. There is no evidence of pulmonary embolus. Atherosclerosis of thoracic aorta is noted without aneurysm or dissection. Visualized portion of upper abdomen is unremarkable. Mild right hilar adenopathy is noted, which measures 24 x 15 mm, is concerning for metastatic disease. Review of the MIP images confirms the above findings. IMPRESSION: No evidence of pulmonary embolus. 14 mm spiculated density noted laterally in the right upper lobe, as well as 11 mm density noted anteriorly in right lung apex. These lesions are concerning for malignancy. Also noted are multiple smaller other nodules inferiorly in the right middle lobe which may represent  metastatic disease. Probable mild right hilar adenopathy is noted as well. PET scan is recommended for further evaluation. Mild right posterior basilar subsegmental atelectasis or pneumonia is noted. Electronically Signed   By: Marijo Conception, M.D.   On: 05/21/2015 21:00   Nm Myocar Multi W/spect W/wall Motion / Ef  05/25/2015   T wave inversion was noted during stress in the II, III,  aVF, V4, V5 and V6 leads.  Findings consistent with prior myocardial infarction.  This is an intermediate risk study.  The left ventricular ejection fraction is severely decreased (<30%).    Dg Chest Portable 1 View  06/10/2015  CLINICAL DATA:  Post CVC placement. EXAM: PORTABLE CHEST 1 VIEW COMPARISON:  06/10/2015 FINDINGS: Endotracheal tube is in place with tip in the lower trachea, estimated to be 3.3 cm above the carina. Nasogastric tube is in place with tip off the film but beyond the gastroesophageal junction. Left-sided AICD lead to the right ventricle. A left IJ central line has been placed, tip overlying the level of superior vena cava. Cardiomegaly. Small bilateral pleural effusions. Minimal patchy density at the right lung base persists. No pneumothorax. IMPRESSION: 1. Interval placement of left IJ central line. 2. Stable cardiomegaly.  Minimal right lower lobe infiltrate. Electronically Signed   By: Nolon Nations M.D.   On: 06/10/2015 21:51   Dg Chest Portable 1 View  06/10/2015  CLINICAL DATA:  Patient unresponsive.  Initial encounter. EXAM: PORTABLE CHEST 1 VIEW COMPARISON:  Chest radiograph and CTA of the chest performed 05/21/2015 FINDINGS: The patient's endotracheal tube is seen ending 4 cm above the carina. An enteric tube is noted extending below the diaphragm. Right basilar airspace opacity is concerning for pneumonia. The left lung appears relatively clear. No pleural effusion or pneumothorax is seen. The cardiomediastinal silhouette is borderline enlarged. An AICD is noted overlying the left  chest wall, with a single lead ending overlying the right ventricle. No acute osseous abnormalities are identified. IMPRESSION: 1. Endotracheal tube seen ending 4 cm above the carina. 2. Right basilar airspace opacity is concerning for pneumonia. 3. Borderline cardiomegaly. Electronically Signed   By: Garald Balding M.D.   On: 06/10/2015 20:21   Dg Abd Portable 1v  06/10/2015  CLINICAL DATA:  Patient unresponsive, acute onset. Initial encounter. EXAM: PORTABLE ABDOMEN - 1 VIEW COMPARISON:  CT of the abdomen and pelvis from 12/28/2014 FINDINGS: An enteric tube is noted ending overlying the body of the stomach. There is distention of the stomach and a colonic loop. This may reflect mild ileus. Evaluation for free intra-abdominal air is limited on a single supine view, though no definite free air is seen. Stool is noted within the sigmoid colon. Clips are noted within the right upper quadrant, reflecting prior cholecystectomy. And AICD lead is noted ending overlying the right ventricle. Mild right basilar airspace opacity is seen. IMPRESSION: 1. Enteric tube noted ending overlying the body of the stomach. 2. Distention of the stomach and colonic loops may reflect mild ileus. No definite free intra-abdominal air seen, though evaluation for free air is limited on a single supine view. Stool noted within the sigmoid colon. 3. Mild right basilar airspace opacity is concerning for pneumonia. Electronically Signed   By: Garald Balding M.D.   On: 06/10/2015 20:20    EKG: Sinus rhythm  ASSESSMENT AND PLAN:   1. Non-ST elevation myocardial infarction, with known severe two-vessel coronary artery disease not felt to be amenable to revascularization, in the setting of sepsis with multiorgan failure. And currently not a candidate for catheterization or revascularization. 2. Known severe ischemic cardiomyopathy 3. Sepsis, septic shock, hypotension, multi organ failure  Recommendations  1. Agree with overall current  therapy 2. Continue heparin 24-48 hours 3. Defer invasive cardiac evaluation at this time. She has known severe two-vessel coronary artery disease not felt to be amenable to revascularization.  SignedIsaias [place]man MD,PhD, [facility] 06/11/2015, 3:24  PM       

## 2015-06-11 NOTE — Progress Notes (Signed)
Patient ID: Lynn Butler, female   DOB: [birth year], 69 y.o.   MRN: [id number] [facility] PROGRESS NOTE  PCP: Juanell Fairly, MD  HPI/Subjective: Patient on the ventilator.  Objective: Filed Vitals:   06/11/15 1400  BP: 117/65  Pulse: 93  Temp: 97.7 F (36.5 C)  Resp: 20    Filed Weights   06/10/15 1928 06/11/15 0148 06/11/15 0215  Weight: 51.256 kg (113 lb) 49.3 kg (108 lb 11 oz) 49.3 kg (108 lb 11 oz)    ROS: Review of Systems  Unable to perform ROS  patient on ventilator Exam: Physical Exam  Constitutional: She is intubated.  HENT:  Nose: No mucosal edema.  Unable to look and mouth  Eyes: Pupils are equal, round, and reactive to light.  Neck: Carotid bruit is not present. No thyromegaly present.  Cardiovascular:  Murmur heard.  Systolic murmur is present with a grade of 2/6  Respiratory: She is intubated. She has decreased breath sounds in the right middle field, the right lower field and the left lower field. She has wheezes in the right lower field and the left lower field.  GI: Soft. There is no tenderness.  Musculoskeletal:       Right ankle: She exhibits no swelling.       Left ankle: She exhibits no swelling.  Neurological:  As per daughter she was responding to her and able to squeeze her hand. I was unable to assess that today  Skin:  Patient had plastic surgery on her for head and face with skin grafts. Scab on the 4 head.  Psychiatric:  Unable to assess on ventilator    Data Reviewed: Basic Metabolic Panel:  Recent Labs Lab 06/10/15 1930 06/11/15 0229  NA 137 139  K 5.4* 5.0  CL 98* 104  CO2 24 27  GLUCOSE 216* 260*  BUN 21* 29*  CREATININE 1.71* 1.63*  CALCIUM 8.1* 7.4*  MG  --  1.6*  PHOS  --  6.6*   Liver Function Tests:  Recent Labs Lab 06/10/15 1930 06/11/15 0229  AST 1138* >2275*  ALT 643* 2215*  ALKPHOS 373* 379*  BILITOT 1.2 1.4*  PROT 5.8* 5.2*  ALBUMIN 2.6* 2.5*    Recent Labs Lab 06/10/15 1930   LIPASE 46   CBC:  Recent Labs Lab 06/10/15 1930 06/11/15 0229  WBC 21.1* 26.4*  NEUTROABS 19.6*  --   HGB 10.2* 10.9*  HCT 35.3 36.7  MCV 69.1* 68.5*  PLT 219 211   Cardiac Enzymes:  Recent Labs Lab 06/10/15 1930 06/11/15 0229 06/11/15 1351  TROPONINI 0.76* 8.37* 17.69*   BNP (last 3 results)  Recent Labs  01/20/15 2043 02/01/15 1432 04/07/15 1610  BNP 1333.0* 1345.0* 2338.0*     CBG:  Recent Labs Lab 06/10/15 1907  GLUCAP 91    Recent Results (from the past 240 hour(s))  Urine culture     Status: None (Preliminary result)   Collection Time: 06/10/15  7:30 PM  Result Value Ref Range Status   Specimen Description URINE, RANDOM  Final   Special Requests NONE  Final   Culture NO GROWTH < 12 HOURS  Final   Report Status PENDING  Incomplete  Blood Culture (routine x 2)     Status: None (Preliminary result)   Collection Time: 06/10/15  8:10 PM  Result Value Ref Range Status   Specimen Description BLOOD RIGHT  Final   Special Requests BOTTLES DRAWN AEROBIC AND ANAEROBIC 5CC  Final   Culture NO GROWTH <  12 HOURS  Final   Report Status PENDING  Incomplete  Blood Culture (routine x 2)     Status: None (Preliminary result)   Collection Time: 06/10/15  8:20 PM  Result Value Ref Range Status   Specimen Description BLOOD RIGHT  Final   Special Requests BOTTLES DRAWN AEROBIC AND ANAEROBIC 2CC AERO, 4CC  Final   Culture NO GROWTH < 12 HOURS  Final   Report Status PENDING  Incomplete  MRSA PCR Screening     Status: Abnormal   Collection Time: 06/11/15  1:40 AM  Result Value Ref Range Status   MRSA by PCR POSITIVE (A) NEGATIVE Final    Comment:        The GeneXpert MRSA Assay (FDA approved for NASAL specimens only), is one component of a comprehensive MRSA colonization surveillance program. It is not intended to diagnose MRSA infection nor to guide or monitor treatment for MRSA infections. CRITICAL RESULT CALLED TO, READ BACK BY AND VERIFIED  WITH: [facility] ALLEN AT [id number] 06/11/15.PMH   Culture, sputum-assessment     Status: None   Collection Time: 06/11/15  4:30 AM  Result Value Ref Range Status   Specimen Description TRACHEAL ASPIRATE  Final   Special Requests NONE  Final   Sputum evaluation THIS SPECIMEN IS ACCEPTABLE FOR SPUTUM CULTURE  Final   Report Status 06/11/2015 FINAL  Final     Studies: Ct Abdomen Pelvis Wo Contrast  06/10/2015  CLINICAL DATA:  Respiratory arrest. Abnormal abdominal radiograph with distended bowel. EXAM: CT ABDOMEN AND PELVIS WITHOUT CONTRAST TECHNIQUE: Multidetector CT imaging of the abdomen and pelvis was performed following the standard protocol without IV contrast. COMPARISON:  Radiography same day.  CT abdomen 12/28/2014. FINDINGS: There is a pleural effusion on the right. There is volume loss and likely pneumonia in the right lower lobe. Left lung bases largely clear. Mild patchy density in the lingula could represent mild pneumonia. The heart is enlarged. No pericardial fluid. The liver has a normal appearance without contrast. Previous cholecystectomy. The spleen is at the upper limits normal in size. No abnormality of the pancreas is seen. Both kidneys contain numerous small calcifications consistent with nonobstructing stones. No evidence of active renal disease. There is atherosclerosis of the aorta and its branches but no aneurysm. 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 Maximal dimension is 5.6 cm. Nasogastric tube enters the stomach and has its tip in the distal body. The stomach is now decompressed. There are no worrisome dilated loops of intestine presently. Anastomotic bowel sutures are evident in the transverse colon. There has probably been right colectomy. There is a large amount of fecal matter in the rectosigmoid. IMPRESSION: There is no longer any distended bowel. Nasogastric  tube in the stomach. No suspicion of ileus or obstruction based on the bowel gas pattern presently. Fairly large amount of stool in the rectosigmoid colon. Right pleural effusion. Probable right lower lobe pneumonia and possible lingular pneumonia. Extensive atherosclerosis. 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 No significant change since the last study when using the same measurement planes. Electronically Signed   By: Nelson Chimes M.D.   On: 06/10/2015 23:55   Ct Head Wo Contrast  06/11/2015  CLINICAL DATA:  Found unresponsive, altered mental status, intubated EXAM: CT HEAD WITHOUT CONTRAST TECHNIQUE: Contiguous axial images were obtained from the base of the  skull through the vertex without contrast. COMPARISON:  06/10/2015 FINDINGS: Stable mild brain atrophy pattern without acute intracranial hemorrhage, mass lesion, definite infarction, hydrocephalus, midline shift, herniation, or extra-axial fluid collection. No focal mass effect or edema. Cisterns remain patent. Cerebellar atrophy as well. Atherosclerosis of the intracranial vessels of the skullbase. Mastoids remain clear. Minor scattered sinus mucosal thickening. Orbits are symmetric. IMPRESSION: Stable atrophy pattern. No interval change or acute process by noncontrast CT Electronically Signed   By: Jerilynn Mages.  Shick M.D.   On: 06/11/2015 09:20   Ct Head Wo Contrast  06/10/2015  CLINICAL DATA:  Patient found unresponsive. Respiratory arrest. Hypoglycemia. Initial encounter. EXAM: CT HEAD WITHOUT CONTRAST TECHNIQUE: Contiguous axial images were obtained from the base of the skull through the vertex without intravenous contrast. COMPARISON:  CT of the head performed 04/07/2015 FINDINGS: There is no evidence of acute infarction, mass lesion, or intra- or extra-axial hemorrhage on CT. Prominence of the ventricles and sulci suggests mild cortical volume loss. The brainstem and fourth ventricle are  within normal limits. The basal ganglia are unremarkable in appearance. The cerebral hemispheres demonstrate grossly normal gray-white differentiation. No mass effect or midline shift is seen. There is no evidence of fracture; visualized osseous structures are unremarkable in appearance. The orbits are within normal limits. Mucosal thickening is noted at the right maxillary sinus. The remaining paranasal sinuses and mastoid air cells are well-aerated. No significant soft tissue abnormalities are seen. IMPRESSION: 1. No acute intracranial pathology seen on CT. 2. Mild cortical volume loss. 3. Mucosal thickening at the right maxillary sinus. Electronically Signed   By: Garald Balding M.D.   On: 06/10/2015 23:48   Dg Chest Portable 1 View  06/10/2015  CLINICAL DATA:  Post CVC placement. EXAM: PORTABLE CHEST 1 VIEW COMPARISON:  06/10/2015 FINDINGS: Endotracheal tube is in place with tip in the lower trachea, estimated to be 3.3 cm above the carina. Nasogastric tube is in place with tip off the film but beyond the gastroesophageal junction. Left-sided AICD lead to the right ventricle. A left IJ central line has been placed, tip overlying the level of superior vena cava. Cardiomegaly. Small bilateral pleural effusions. Minimal patchy density at the right lung base persists. No pneumothorax. IMPRESSION: 1. Interval placement of left IJ central line. 2. Stable cardiomegaly.  Minimal right lower lobe infiltrate. Electronically Signed   By: Nolon Nations M.D.   On: 06/10/2015 21:51   Dg Chest Portable 1 View  06/10/2015  CLINICAL DATA:  Patient unresponsive.  Initial encounter. EXAM: PORTABLE CHEST 1 VIEW COMPARISON:  Chest radiograph and CTA of the chest performed 05/21/2015 FINDINGS: The patient's endotracheal tube is seen ending 4 cm above the carina. An enteric tube is noted extending below the diaphragm. Right basilar airspace opacity is concerning for pneumonia. The left lung appears relatively clear. No  pleural effusion or pneumothorax is seen. The cardiomediastinal silhouette is borderline enlarged. An AICD is noted overlying the left chest wall, with a single lead ending overlying the right ventricle. No acute osseous abnormalities are identified. IMPRESSION: 1. Endotracheal tube seen ending 4 cm above the carina. 2. Right basilar airspace opacity is concerning for pneumonia. 3. Borderline cardiomegaly. Electronically Signed   By: Garald Balding M.D.   On: 06/10/2015 20:21   Dg Abd Portable 1v  06/10/2015  CLINICAL DATA:  Patient unresponsive, acute onset. Initial encounter. EXAM: PORTABLE ABDOMEN - 1 VIEW COMPARISON:  CT of the abdomen and pelvis from 12/28/2014 FINDINGS: An enteric tube is noted ending overlying  the body of the stomach. There is distention of the stomach and a colonic loop. This may reflect mild ileus. Evaluation for free intra-abdominal air is limited on a single supine view, though no definite free air is seen. Stool is noted within the sigmoid colon. Clips are noted within the right upper quadrant, reflecting prior cholecystectomy. And AICD lead is noted ending overlying the right ventricle. Mild right basilar airspace opacity is seen. IMPRESSION: 1. Enteric tube noted ending overlying the body of the stomach. 2. Distention of the stomach and colonic loops may reflect mild ileus. No definite free intra-abdominal air seen, though evaluation for free air is limited on a single supine view. Stool noted within the sigmoid colon. 3. Mild right basilar airspace opacity is concerning for pneumonia. Electronically Signed   By: Garald Balding M.D.   On: 06/10/2015 20:20    Scheduled Meds: . antiseptic oral rinse  7 mL Mouth Rinse QID  . antiseptic oral rinse  7 mL Mouth Rinse QID  . aspirin  81 mg Per Tube Daily  . azithromycin  500 mg Intravenous Q24H  . budesonide  0.25 mg Nebulization BID  . chlorhexidine gluconate  15 mL Mouth Rinse BID  . Chlorhexidine Gluconate Cloth  6 each  Topical Q0600  . hydrocortisone sod succinate (SOLU-CORTEF) inj  50 mg Intravenous Q8H  . ipratropium-albuterol  3 mL Nebulization Q6H  . mupirocin ointment  1 application Nasal BID  . pantoprazole (PROTONIX) IV  40 mg Intravenous Q24H  . piperacillin-tazobactam (ZOSYN)  IV  3.375 g Intravenous 3 times per day  . vancomycin  500 mg Intravenous Q24H   Continuous Infusions: . dextrose 5 % and 0.45% NaCl 100 mL/hr at 06/11/15 1223  . fentaNYL infusion INTRAVENOUS 20 mcg/hr (06/11/15 1552)  . heparin 12 Units/kg/hr (06/11/15 1541)    Assessment/Plan:  1. Septic shock with multiorgan failure. Overall prognosis is poor. High mortality. Patient is a full code. Patient is off pressors at this point. Stress dose steroids. 2. Sepsis with pneumonia right lung- triple antibiotic coverage 3. Acute respiratory failure with hypoxia- continue full ventilatory support at this time. With history of end-stage COPD it may be difficult to get her off the ventilator. 4. Shock liver with coagulopathy- severely elevated LFTs. Supportive care 5. Acute myocardial infarction- likely secondary to septic shock. Continue heparin drip for 48 hours and aspirin. 6. Acute renal failure and oligoria- we'll get nephrology consultation. May end up needing dialysis if things do not improve. 7. Chronic pain on numerous outpatient medications- start fentanyl drip for sedation 8. Acute encephalopathy likely secondary to septic shock 9. History of skin cancer status post plastic surgery procedure.  Code Status:     Code Status Orders        Start     Ordered   06/11/15 0142  Full code   Continuous     06/11/15 0141     Family Communication: Spoke with daughter at length on the phone Disposition Plan: To be determined  Consultants:  Critical care specialist  Cardiology  Nephrology  Time spent: 38 minutes critical care time  Loletha Grayer  [facility]

## 2015-06-11 NOTE — Progress Notes (Addendum)
Pt found to be unarousable with sedation off and unresponsive to pain. Pts left pupil is unreactive. Pt diaphoretic, no ECG changes. Md called to bedside and STAT CT was ordered. Heparin gtt and propofol held. VSS at this time.

## 2015-06-11 NOTE — Progress Notes (Signed)
eLink Physician-Brief Progress Note Patient Name: Lynn Butler DOB: Sep 03, 1945 MRN: [id number]   Date of Service  06/11/2015  HPI/Events of Note  Patient on mechanical ventilation. Will require stress ulcer prophylaxis.  eICU Interventions  Will order Protonix 40 mg now and Q day.     Intervention Category Intermediate Interventions: Best-practice therapies (e.g. DVT, beta blocker, etc.)  Sommer,Steven Eugene 06/11/2015, 2:57 AM

## 2015-06-11 NOTE — Progress Notes (Signed)
ANTICOAGULATION CONSULT NOTE - Initial Consult  Pharmacy Consult for heparin gtt  Indication: chest pain/ACS  Allergies  Allergen Reactions  . Nsaids Nausea And Vomiting and Other (See Comments)    Reaction:  GI distress and burning     Patient Measurements: Height: 5\' 2"  (157.5 cm) Weight: 108 lb 11 oz (49.3 kg) IBW/kg (Calculated) : 50.1 Heparin Dosing Weight: 49.3 kg  Vital Signs: Temp: 97.7 F (36.5 C) (11/12 1400) Temp Source: Core (Comment) (11/12 1200) BP: 117/65 mmHg (11/12 1400) Pulse Rate: 93 (11/12 1400)  Labs:  Recent Labs  06/10/15 1930 06/11/15 0229 06/11/15 1351  HGB 10.2* 10.9*  --   HCT 35.3 36.7  --   PLT 219 211  --   APTT 41*  --   --   LABPROT 24.7*  --   --   INR 2.26  --   --   CREATININE 1.71* 1.63*  --   TROPONINI 0.76* 8.37* 17.69*    Estimated Creatinine Clearance: 25.7 mL/min (by C-G formula based on Cr of 1.63).   Medical History: Past Medical History  Diagnosis Date  . COPD (chronic obstructive pulmonary disease) ([place])   . Hypertension   . Ischemic cardiomyopathy     a.   . Coronary artery disease     a. s/p multiple stenting in 2005  . History of ventricular tachycardia   . PVD (peripheral vascular disease) ([place])   . PAD (peripheral artery disease) ([place])   . HLD (hyperlipidemia)   . Chronic back pain   . MI, old   . MRSA (methicillin resistant staph aureus) culture positive     left foot wound  . CHF (congestive heart failure) ([place])   . Pneumonia   . Asthma   . Cancer [facility])     Assessment: Pharmacy consulted to dose heparin gtt in this 69 year old female s/p respiratory arrest who was found unresponsive by her daughter. Heparin infusion was started overnight but was stopped around 0830 this AM due to concern for bleeding. Patient had CT which was negative for hemorrhage. Troponin increased and now is at 17.69 mg/mL. MD would like heparin gtt restarted at this time.  Goal of Therapy:  Heparin level 0.3-0.7  units/ml Monitor platelets by anticoagulation protocol: Yes   Plan:  Give 2000 units bolus x 1 Start heparin infusion at 600 units/hr Check anti-Xa level in 8 hours and daily while on heparin Continue to monitor H&H and platelets  Darylene Price Shaida Route 06/11/2015,3:19 PM

## 2015-06-11 NOTE — Progress Notes (Signed)
Pt making no urine at this time, Md notified

## 2015-06-11 NOTE — Progress Notes (Signed)
Lab called about Pt's critical troponin of 8.3. Dr. Jannifer Franklin already has put in orders for a cardiology consult to address elevated troponin. Will continue to monitor

## 2015-06-11 NOTE — Consult Note (Signed)
PULMONARY / CRITICAL CARE MEDICINE   Name: Lynn Butler MRN: [id number] DOB: [birth year]    ADMISSION DATE:  06/10/2015 CONSULTATION DATE:  11/12  HPI:  78 F was in her USOH (which is generally poor) on the morning of admission. Later was noted to be increasingly weak with AMS. Transported to Ed and ultimately intubated. Briefly hypotensive requiring vasopressors  MAJOR EVENTS/TEST RESULTS: 11/11 CTAP: NAD. Severe atherosclerosis 11/11 CT head: NAD 11/12 CT head: NAD   INDWELLING DEVICES:: ETT 11/11 >>  L IJ CVL 11/11 >>  MICRO DATA: Results for orders placed or performed during the hospital encounter of 06/10/15  Urine culture     Status: None (Preliminary result)   Collection Time: 06/10/15  7:30 PM  Result Value Ref Range Status   Specimen Description URINE, RANDOM  Final   Special Requests NONE  Final   Culture NO GROWTH < 12 HOURS  Final   Report Status PENDING  Incomplete  Blood Culture (routine x 2)     Status: None (Preliminary result)   Collection Time: 06/10/15  8:10 PM  Result Value Ref Range Status   Specimen Description BLOOD RIGHT  Final   Special Requests BOTTLES DRAWN AEROBIC AND ANAEROBIC 5CC  Final   Culture NO GROWTH < 12 HOURS  Final   Report Status PENDING  Incomplete  Blood Culture (routine x 2)     Status: None (Preliminary result)   Collection Time: 06/10/15  8:20 PM  Result Value Ref Range Status   Specimen Description BLOOD RIGHT  Final   Special Requests BOTTLES DRAWN AEROBIC AND ANAEROBIC 2CC AERO, 4CC  Final   Culture NO GROWTH < 12 HOURS  Final   Report Status PENDING  Incomplete  MRSA PCR Screening     Status: Abnormal   Collection Time: 06/11/15  1:40 AM  Result Value Ref Range Status   MRSA by PCR POSITIVE (A) NEGATIVE Final    Comment:        The GeneXpert MRSA Assay (FDA approved for NASAL specimens only), is one component of a comprehensive MRSA colonization surveillance program. It is not intended to diagnose MRSA infection  nor to guide or monitor treatment for MRSA infections. CRITICAL RESULT CALLED TO, READ BACK BY AND VERIFIED WITH: [facility] ALLEN AT [id number] 06/11/15.PMH   Culture, sputum-assessment     Status: None   Collection Time: 06/11/15  4:30 AM  Result Value Ref Range Status   Specimen Description TRACHEAL ASPIRATE  Final   Special Requests NONE  Final   Sputum evaluation THIS SPECIMEN IS ACCEPTABLE FOR SPUTUM CULTURE  Final   Report Status 06/11/2015 FINAL  Final     ANTIMICROBIALS: Anti-infectives    Start     Dose/Rate Route Frequency Ordered Stop   06/11/15 2100  vancomycin (VANCOCIN) 500 mg in sodium chloride 0.9 % 100 mL IVPB     500 mg 100 mL/hr over 60 Minutes Intravenous Every 24 hours 06/11/15 0146     06/11/15 0600  piperacillin-tazobactam (ZOSYN) IVPB 3.375 g     3.375 g 12.5 mL/hr over 240 Minutes Intravenous 3 times per day 06/11/15 0146     06/10/15 2245  azithromycin (ZITHROMAX) 500 mg in dextrose 5 % 250 mL IVPB     500 mg 250 mL/hr over 60 Minutes Intravenous Every 24 hours 06/10/15 2232     06/10/15 2200  piperacillin-tazobactam (ZOSYN) IVPB 3.375 g  Status:  Discontinued     3.375 g 100 mL/hr over 30 Minutes Intravenous 3  times per day 06/10/15 1927 06/11/15 0126   06/10/15 1930  vancomycin (VANCOCIN) IVPB 1000 mg/200 mL premix     1,000 mg 200 mL/hr over 60 Minutes Intravenous  Once 06/10/15 1927 06/10/15 2332      HISTORY OF PRESENT ILLNESS:   Admission note reviewed. History confirmed with daughter. Pt unable to provide any history  PAST MEDICAL HISTORY :   has a past medical history of COPD (chronic obstructive pulmonary disease) ([place]); Hypertension; Ischemic cardiomyopathy; Coronary artery disease; History of ventricular tachycardia; PVD (peripheral vascular disease) ([place]); PAD (peripheral artery disease) ([place]); HLD (hyperlipidemia); Chronic back pain; MI, old; MRSA (methicillin resistant staph aureus) culture positive; CHF (congestive heart failure) ([place]);  Pneumonia; Asthma; and Cancer ([place]).  has past surgical history that includes Insert / replace / remove pacemaker; Vascular bypass surgery (Bilateral); Cardiac catheterization (N/A, 01/14/2015); Cardiac defibrillator placement; Appendectomy; Cesarean section; and Abdominal surgery. Prior to Admission medications   Medication Sig Start Date End Date Taking? Authorizing Provider  albuterol (PROVENTIL HFA;VENTOLIN HFA) 108 (90 BASE) MCG/ACT inhaler Inhale 2 puffs into the lungs every 4 (four) hours as needed for wheezing or shortness of breath.   Yes Historical Provider, MD  atorvastatin (LIPITOR) 40 MG tablet Take 1 tablet (40 mg total) by mouth daily at 6 PM. 01/17/15  Yes Gladstone Lighter, MD  benzonatate (TESSALON) 200 MG capsule Take 1 capsule (200 mg total) by mouth 3 (three) times daily as needed for cough. 02/11/15  Yes Vaughan Basta, MD  carvedilol (COREG) 3.125 MG tablet Take 1 tablet (3.125 mg total) by mouth 2 (two) times daily with a meal. 01/17/15  Yes Gladstone Lighter, MD  collagenase (SANTYL) ointment Apply 1 application topically daily.   Yes Historical Provider, MD  diazepam (VALIUM) 10 MG tablet Take 1 tablet (10 mg total) by mouth every 12 (twelve) hours as needed for anxiety. 02/03/15  Yes Nicholes Mango, MD  feeding supplement, ENSURE ENLIVE, (ENSURE ENLIVE) LIQD Take 237 mLs by mouth 3 (three) times daily with meals. 02/11/15  Yes Vaughan Basta, MD  guaiFENesin (MUCINEX) 600 MG 12 hr tablet Take 1 tablet (600 mg total) by mouth 2 (two) times daily. 02/11/15  Yes Vaughan Basta, MD  HYDROmorphone (DILAUDID) 4 MG tablet Take 1 tablet (4 mg total) by mouth every 6 (six) hours as needed for severe pain. 04/11/15  Yes Henreitta Leber, MD  ipratropium-albuterol (DUONEB) 0.5-2.5 (3) MG/3ML SOLN Take 3 mLs by nebulization every 6 (six) hours as needed. Patient taking differently: Take 3 mLs by nebulization every 6 (six) hours as needed (for wheezing/shortness of breath).   02/11/15  Yes Vaughan Basta, MD  isosorbide mononitrate (IMDUR) 30 MG 24 hr tablet Take 1 tablet (30 mg total) by mouth daily. 02/11/15  Yes Vaughan Basta, MD  loperamide (IMODIUM A-D) 2 MG tablet Take 1 tablet (2 mg total) by mouth 4 (four) times daily as needed for diarrhea or loose stools. 04/11/15  Yes Henreitta Leber, MD  mometasone-formoterol (DULERA) 100-5 MCG/ACT AERO Inhale 2 puffs into the lungs 2 (two) times daily. 04/11/15  Yes Henreitta Leber, MD  OxyCODONE (OXYCONTIN) 80 mg T12A 12 hr tablet Take 1 tablet (80 mg total) by mouth every 12 (twelve) hours. 04/11/15  Yes Henreitta Leber, MD  promethazine (PHENERGAN) 25 MG tablet Take 25 mg by mouth daily as needed for nausea or vomiting.   Yes Historical Provider, MD  tiotropium (SPIRIVA) 18 MCG inhalation capsule Place 1 capsule (18 mcg total) into inhaler and inhale  daily. 04/11/15  Yes Henreitta Leber, MD  zolpidem (AMBIEN) 10 MG tablet Take 5 mg by mouth at bedtime as needed for sleep.   Yes Historical Provider, MD   Allergies  Allergen Reactions  . Nsaids Nausea And Vomiting and Other (See Comments)    Reaction:  GI distress and burning     FAMILY HISTORY:  indicated that her mother is deceased. She indicated that her father is deceased.  SOCIAL HISTORY:  reports that she has been smoking Cigarettes.  She has been smoking about 0.50 packs per day. She has never used smokeless tobacco. She reports that she does not drink alcohol or use illicit drugs.  SUBJECTIVE:   VITAL SIGNS: Temp:  [95 F (35 C)-98.6 F (37 C)] 97.7 F (36.5 C) (11/12 1900) Pulse Rate:  [80-102] 85 (11/12 1900) Resp:  [4-24] 20 (11/12 1900) BP: (80-138)/(45-77) 114/65 mmHg (11/12 1900) SpO2:  [92 %-100 %] 92 % (11/12 2009) FiO2 (%):  [30 %-60 %] 30 % (11/12 2009) Weight:  [49.3 kg (108 lb 11 oz)] 49.3 kg (108 lb 11 oz) (11/12 0215) HEMODYNAMICS:   VENTILATOR SETTINGS: Vent Mode:  [-] PRVC FiO2 (%):  [30 %-60 %] 30 % Set Rate:  [16  bmp-20 bmp] 20 bmp Vt Set:  [350 mL-400 mL] 400 mL PEEP:  [5 cmH20-10 cmH20] 5 cmH20 INTAKE / OUTPUT:  Intake/Output Summary (Last 24 hours) at 06/11/15 2043 Last data filed at 06/11/15 1900  Gross per 24 hour  Intake 1556.82 ml  Output    190 ml  Net 1366.82 ml    PHYSICAL EXAMINATION: General: RASS -5, frail appearing, appears much older than chronological age Neuro: PERRL, no withdrawal from pain, DTRs absent HEENT: NCAT, forehead lesion Cardiovascular: reg, no M Lungs: no wheezes Abdomen: scaphoid, diminished BS Ext: severe atrophy, no edema   LABS:  CBC  Recent Labs Lab 06/10/15 1930 06/11/15 0229  WBC 21.1* 26.4*  HGB 10.2* 10.9*  HCT 35.3 36.7  PLT 219 211   Coag's  Recent Labs Lab 06/10/15 1930 06/11/15 1351  APTT 41* 30  INR 2.26 1.93   BMET  Recent Labs Lab 06/10/15 1930 06/11/15 0229  NA 137 139  K 5.4* 5.0  CL 98* 104  CO2 24 27  BUN 21* 29*  CREATININE 1.71* 1.63*  GLUCOSE 216* 260*   Electrolytes  Recent Labs Lab 06/10/15 1930 06/11/15 0229  CALCIUM 8.1* 7.4*  MG  --  1.6*  PHOS  --  6.6*   Sepsis Markers  Recent Labs Lab 06/10/15 1930 06/11/15 0229  LATICACIDVEN 6.7* 1.8   ABG  Recent Labs Lab 06/10/15 1925 06/10/15 2300 06/11/15 0600  PHART 7.19* 7.26* PENDING  PCO2ART 59* 53* 61*  PO2ART 92 146* 141*   Liver Enzymes  Recent Labs Lab 06/10/15 1930 06/11/15 0229  AST 1138* >2275*  ALT 643* 2215*  ALKPHOS 373* 379*  BILITOT 1.2 1.4*  ALBUMIN 2.6* 2.5*   Cardiac Enzymes  Recent Labs Lab 06/10/15 1930 06/11/15 0229 06/11/15 1351  TROPONINI 0.76* 8.37* 17.69*   Glucose  Recent Labs Lab 06/10/15 1907  GLUCAP 91    Imaging Ct Abdomen Pelvis Wo Contrast  06/10/2015  CLINICAL DATA:  Respiratory arrest. Abnormal abdominal radiograph with distended bowel. EXAM: CT ABDOMEN AND PELVIS WITHOUT CONTRAST TECHNIQUE: Multidetector CT imaging of the abdomen and pelvis was performed following the  standard protocol without IV contrast. COMPARISON:  Radiography same day.  CT abdomen 12/28/2014. FINDINGS: There is a pleural effusion on  the right. There is volume loss and likely pneumonia in the right lower lobe. Left lung bases largely clear. Mild patchy density in the lingula could represent mild pneumonia. The heart is enlarged. No pericardial fluid. The liver has a normal appearance without contrast. Previous cholecystectomy. The spleen is at the upper limits normal in size. No abnormality of the pancreas is seen. Both kidneys contain numerous small calcifications consistent with nonobstructing stones. No evidence of active renal disease. There is atherosclerosis of the aorta and its branches but no aneurysm. 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 Maximal dimension is 5.6 cm. Nasogastric tube enters the stomach and has its tip in the distal body. The stomach is now decompressed. There are no worrisome dilated loops of intestine presently. Anastomotic bowel sutures are evident in the transverse colon. There has probably been right colectomy. There is a large amount of fecal matter in the rectosigmoid. IMPRESSION: There is no longer any distended bowel. Nasogastric tube in the stomach. No suspicion of ileus or obstruction based on the bowel gas pattern presently. Fairly large amount of stool in the rectosigmoid colon. Right pleural effusion. Probable right lower lobe pneumonia and possible lingular pneumonia. Extensive atherosclerosis. 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 No significant change since the last study when using the same measurement planes. Electronically Signed   By: Nelson Chimes M.D.   On: 06/10/2015 23:55   Ct Head Wo Contrast  06/11/2015  CLINICAL DATA:  Found unresponsive, altered mental status, intubated EXAM:  CT HEAD WITHOUT CONTRAST TECHNIQUE: Contiguous axial images were obtained from the base of the skull through the vertex without contrast. COMPARISON:  06/10/2015 FINDINGS: Stable mild brain atrophy pattern without acute intracranial hemorrhage, mass lesion, definite infarction, hydrocephalus, midline shift, herniation, or extra-axial fluid collection. No focal mass effect or edema. Cisterns remain patent. Cerebellar atrophy as well. Atherosclerosis of the intracranial vessels of the skullbase. Mastoids remain clear. Minor scattered sinus mucosal thickening. Orbits are symmetric. IMPRESSION: Stable atrophy pattern. No interval change or acute process by noncontrast CT Electronically Signed   By: Jerilynn Mages.  Shick M.D.   On: 06/11/2015 09:20   Ct Head Wo Contrast  06/10/2015  CLINICAL DATA:  Patient found unresponsive. Respiratory arrest. Hypoglycemia. Initial encounter. EXAM: CT HEAD WITHOUT CONTRAST TECHNIQUE: Contiguous axial images were obtained from the base of the skull through the vertex without intravenous contrast. COMPARISON:  CT of the head performed 04/07/2015 FINDINGS: There is no evidence of acute infarction, mass lesion, or intra- or extra-axial hemorrhage on CT. Prominence of the ventricles and sulci suggests mild cortical volume loss. The brainstem and fourth ventricle are within normal limits. The basal ganglia are unremarkable in appearance. The cerebral hemispheres demonstrate grossly normal gray-white differentiation. No mass effect or midline shift is seen. There is no evidence of fracture; visualized osseous structures are unremarkable in appearance. The orbits are within normal limits. Mucosal thickening is noted at the right maxillary sinus. The remaining paranasal sinuses and mastoid air cells are well-aerated. No significant soft tissue abnormalities are seen. IMPRESSION: 1. No acute intracranial pathology seen on CT. 2. Mild cortical volume loss. 3. Mucosal thickening at the right maxillary  sinus. Electronically Signed   By: Garald Balding M.D.   On: 06/10/2015 23:48   Dg Chest Portable 1 View  06/10/2015  CLINICAL DATA:  Post CVC placement. EXAM:  PORTABLE CHEST 1 VIEW COMPARISON:  06/10/2015 FINDINGS: Endotracheal tube is in place with tip in the lower trachea, estimated to be 3.3 cm above the carina. Nasogastric tube is in place with tip off the film but beyond the gastroesophageal junction. Left-sided AICD lead to the right ventricle. A left IJ central line has been placed, tip overlying the level of superior vena cava. Cardiomegaly. Small bilateral pleural effusions. Minimal patchy density at the right lung base persists. No pneumothorax. IMPRESSION: 1. Interval placement of left IJ central line. 2. Stable cardiomegaly.  Minimal right lower lobe infiltrate. Electronically Signed   By: Nolon Nations M.D.   On: 06/10/2015 21:51     ASSESSMENT / PLAN:  PULMONARY A: Acute resp failure Chronic COPD without acute bronchospasm P:   Vent settings established Vent bundle implemented Daily SBT as indicated  CARDIOVASCULAR A:  Acute MI Shock, resolved CAD PVD P:  MAP goal > 65 mmHg Will need Cards eval  RENAL A:  AKI P:   Monitor BMET intermittently Monitor I/Os Correct electrolytes as indicated  GASTROINTESTINAL A:   No issues P:   SUP:  Consider TFs AM 11/13  HEMATOLOGIC A:   NO issues P:  DVT px: SQ heparin Monitor CBC intermittently Transfuse per usual ICU guidelines  INFECTIOUS A:   Possible severe sepsis, unclear source P:   Monitor temp, WBC count Micro and abx as above  ENDOCRINE A:   Hyperglycemia without documented DM Frequent steroid dosing - suspect adrenal insuff P:   SSI ordered Chasnge [facility]pred to [facility]  NEUROLOGIC A:   Severe acute encephalopathy P:   RASS goal: -1 DC all sedatives, analgesics   FAMILY  - Updates: daughter 11/12:I began discussion re: goals of care/EOL  CCM time:60 mins The above time includes  time spent in consultation with patient and/or family members and reviewing care plan on multidisciplinary rounds  Merton Border, MD PCCM service Mobile [phone] Pager [phone]     06/11/2015, 8:43 PM

## 2015-06-11 NOTE — Progress Notes (Signed)
eLink Physician-Brief Progress Note Patient Name: Lynn Butler DOB: 20-Sep-1945 MRN: [id number]   Date of Service  06/11/2015  HPI/Events of Note  69 yo female with PMH of COPD on home O2, HTN, CAD - s/p MI and multiple stents 2005, VT - s/p AICD, Hyperlipidemia, PVD, Ischemic Cardiomyopathy, CHF and pneumonia. Admitted with pneumonia (HCAP)  and septic shock. Lactic Acid = 6.7. Now intubated and mechanically ventilated. Medical regimen includes Solumedrol, Spivira, Norepinephrine, Vancomycin, Zosyn and Azithromycin. Temp = 95 F. BP = 104/59 and HR - 85. Sat now = 100% on mechanical ventilation. Sedated with Propofol IV infusion.   eICU Interventions  Continue present management. Follow ABG and serial lactic acid levels to monitor clearance.      Intervention Category Evaluation Type: New Patient Evaluation  Lysle Dingwall 06/11/2015, 2:21 AM

## 2015-06-12 ENCOUNTER — Inpatient Hospital Stay: Payer: Medicare Other

## 2015-06-12 DIAGNOSIS — I214 Non-ST elevation (NSTEMI) myocardial infarction: Secondary | ICD-10-CM

## 2015-06-12 DIAGNOSIS — N179 Acute kidney failure, unspecified: Secondary | ICD-10-CM

## 2015-06-12 LAB — COMPREHENSIVE METABOLIC PANEL
ALBUMIN: 2.4 g/dL — AB (ref 3.5–5.0)
ALK PHOS: 350 U/L — AB (ref 38–126)
ALT: 1958 U/L — AB (ref 14–54)
ANION GAP: 9 (ref 5–15)
AST: 2031 U/L — ABNORMAL HIGH (ref 15–41)
BILIRUBIN TOTAL: 0.8 mg/dL (ref 0.3–1.2)
BUN: 49 mg/dL — ABNORMAL HIGH (ref 6–20)
CALCIUM: 7.2 mg/dL — AB (ref 8.9–10.3)
CO2: 21 mmol/L — AB (ref 22–32)
CREATININE: 3.13 mg/dL — AB (ref 0.44–1.00)
Chloride: 109 mmol/L (ref 101–111)
GFR calc non Af Amer: 14 mL/min — ABNORMAL LOW (ref >60)
GFR, EST AFRICAN AMERICAN: 16 mL/min — AB (ref >60)
Glucose, Bld: 159 mg/dL — ABNORMAL HIGH (ref 65–99)
Potassium: 5.3 mmol/L — ABNORMAL HIGH (ref 3.5–5.1)
SODIUM: 139 mmol/L (ref 135–145)
TOTAL PROTEIN: 5.3 g/dL — AB (ref 6.5–8.1)

## 2015-06-12 LAB — PROCALCITONIN: PROCALCITONIN: 32.06 ng/mL

## 2015-06-12 LAB — CBC
HCT: 35.5 % (ref 35.0–47.0)
HEMOGLOBIN: 10.6 g/dL — AB (ref 12.0–16.0)
MCH: 20.3 pg — AB (ref 26.0–34.0)
MCHC: 29.7 g/dL — AB (ref 32.0–36.0)
MCV: 68.3 fL — ABNORMAL LOW (ref 80.0–100.0)
PLATELETS: 178 10*3/uL (ref 150–440)
RBC: 5.2 MIL/uL (ref 3.80–5.20)
RDW: 23.1 % — ABNORMAL HIGH (ref 11.5–14.5)
WBC: 19 10*3/uL — ABNORMAL HIGH (ref 3.6–11.0)

## 2015-06-12 LAB — HEPARIN LEVEL (UNFRACTIONATED): Heparin Unfractionated: <0.1 IU/mL — ABNORMAL LOW (ref 0.30–0.70)

## 2015-06-12 LAB — GLUCOSE, CAPILLARY
GLUCOSE-CAPILLARY: 109 mg/dL — AB (ref 65–99)
GLUCOSE-CAPILLARY: 106 mg/dL — AB (ref 65–99)
GLUCOSE-CAPILLARY: 110 mg/dL — AB (ref 65–99)
Glucose-Capillary: 138 mg/dL — ABNORMAL HIGH (ref 65–99)
Glucose-Capillary: 140 mg/dL — ABNORMAL HIGH (ref 65–99)
Glucose-Capillary: 142 mg/dL — ABNORMAL HIGH (ref 65–99)

## 2015-06-12 LAB — TROPONIN I: TROPONIN I: 12.2 ng/mL — AB (ref <0.031)

## 2015-06-12 LAB — URINE CULTURE: Culture: NO GROWTH

## 2015-06-12 LAB — APTT: APTT: 29 s (ref 24–36)

## 2015-06-12 MED ORDER — HEPARIN BOLUS VIA INFUSION
1500.0000 [IU] | Freq: Once | INTRAVENOUS | Status: AC
  Administered 2015-06-12: 1500 [IU] via INTRAVENOUS
  Filled 2015-06-12: qty 1500

## 2015-06-12 MED ORDER — NOREPINEPHRINE 4 MG/250ML-% IV SOLN
0.0000 ug/min | INTRAVENOUS | Status: DC
  Administered 2015-06-13: 2 ug/min via INTRAVENOUS
  Administered 2015-06-13: 4 ug/min via INTRAVENOUS
  Filled 2015-06-12 (×2): qty 250

## 2015-06-12 MED ORDER — PIPERACILLIN-TAZOBACTAM 3.375 G IVPB
3.3750 g | Freq: Two times a day (BID) | INTRAVENOUS | Status: DC
  Administered 2015-06-12 – 2015-06-13 (×2): 3.375 g via INTRAVENOUS
  Filled 2015-06-12 (×3): qty 50

## 2015-06-12 MED ORDER — SODIUM BICARBONATE 8.4 % IV SOLN
INTRAVENOUS | Status: DC
  Administered 2015-06-12 – 2015-06-13 (×3): via INTRAVENOUS
  Filled 2015-06-12 (×6): qty 100

## 2015-06-12 MED ORDER — HEPARIN BOLUS VIA INFUSION
750.0000 [IU] | Freq: Once | INTRAVENOUS | Status: AC
  Administered 2015-06-12: 750 [IU] via INTRAVENOUS
  Filled 2015-06-12: qty 750

## 2015-06-12 MED ORDER — HEPARIN (PORCINE) IN NACL 100-0.45 UNIT/ML-% IJ SOLN: 700.0000 [IU]/h | INTRAMUSCULAR | Status: DC

## 2015-06-12 MED ORDER — VITAL HIGH PROTEIN PO LIQD
1000.0000 mL | ORAL | Status: DC
  Administered 2015-06-12 – 2015-06-13 (×2): 1000 mL

## 2015-06-12 MED ORDER — HEPARIN (PORCINE) IN NACL 100-0.45 UNIT/ML-% IJ SOLN
1100.0000 [IU]/h | INTRAMUSCULAR | Status: DC
  Administered 2015-06-12: 700 [IU]/h via INTRAVENOUS
  Administered 2015-06-12: 900 [IU]/h via INTRAVENOUS
  Filled 2015-06-12: qty 250

## 2015-06-12 MED ORDER — NOREPINEPHRINE BITARTRATE 1 MG/ML IV SOLN
0.0000 ug/min | INTRAVENOUS | Status: DC
  Administered 2015-06-12: 5 ug/min via INTRAVENOUS

## 2015-06-12 MED ORDER — VANCOMYCIN HCL IN DEXTROSE 750-5 MG/150ML-% IV SOLN
750.0000 mg | INTRAVENOUS | Status: DC | PRN
  Filled 2015-06-12: qty 150

## 2015-06-12 NOTE — Progress Notes (Signed)
ANTICOAGULATION CONSULT NOTE - Follow UP  Pharmacy Consult for heparin gtt  Indication: chest pain/ACS  Allergies  Allergen Reactions  . Nsaids Nausea And Vomiting and Other (See Comments)    Reaction:  GI distress and burning     Patient Measurements: Height: 5\' 2"  (157.5 cm) Weight: 121 lb 0.5 oz (54.9 kg) IBW/kg (Calculated) : 50.1 Heparin Dosing Weight: 49.3 kg  Vital Signs: Temp: 99 F (37.2 C) (11/13 0500) BP: 98/51 mmHg (11/13 0500) Pulse Rate: 93 (11/13 0500)  Labs:  Recent Labs  06/10/15 1930 06/11/15 0229 06/11/15 1351 06/11/15 2252 06/12/15 0330 06/12/15 0540 06/12/15 1101  HGB 10.2* 10.9*  --   --  10.6*  --   --   HCT 35.3 36.7  --   --  35.5  --   --   PLT 219 211  --   --  178  --   --   APTT 41*  --  30  --   --   --  29  LABPROT 24.7*  --  22.0*  --   --   --   --   INR 2.26  --  1.93  --   --   --   --   HEPARINUNFRC  --   --   --  <0.10*  --   --  <0.10*  CREATININE 1.71* 1.63*  --   --  3.13*  --   --   TROPONINI 0.76* 8.37* 17.69* 15.54*  --  12.20*  --     Estimated Creatinine Clearance: 13.6 mL/min (by C-G formula based on Cr of 3.13).   Medical History: Past Medical History  Diagnosis Date  . COPD (chronic obstructive pulmonary disease) ([place])   . Hypertension   . Ischemic cardiomyopathy     a.   . Coronary artery disease     a. s/p multiple stenting in 2005  . History of ventricular tachycardia   . PVD (peripheral vascular disease) ([place])   . PAD (peripheral artery disease) ([place])   . HLD (hyperlipidemia)   . Chronic back pain   . MI, old   . MRSA (methicillin resistant staph aureus) culture positive     left foot wound  . CHF (congestive heart failure) ([place])   . Pneumonia   . Asthma   . Cancer [facility])     Assessment: Pharmacy consulted to dose heparin gtt in this 69 year old female s/p respiratory arrest who was found unresponsive by her daughter. Heparin infusion was started overnight but was stopped around 0830 this AM due  to concern for bleeding. Patient had CT which was negative for hemorrhage. Troponin increased and now is at 17.69 mg/mL. MD would like heparin gtt restarted at this time.  Goal of Therapy:  Heparin level 0.3-0.7 units/ml Monitor platelets by anticoagulation protocol: Yes   Plan:  Give 2000 units bolus x 1 Start heparin infusion at 600 units/hr Check anti-Xa level in 8 hours and daily while on heparin Continue to monitor H&H and platelets   1112 2252 HL subtherapeutic, 750 unit IV x 1 bolus and increase rate to 700 units/hr. Will recheck level in 8 hours. . 11/13: Heparin level resulted @ <0.10 @ 11:01. Will give heparin bolus of 1500 units x 1 and will increase heparin gtt to 900 units/hr. Will order another heparin level to be drawn @ 20:30. Target goal of 0.3-0.7.   Silvino Selman D, Pharm.D.  Clinical Pharmacist 06/12/2015,12:14 PM

## 2015-06-12 NOTE — Progress Notes (Signed)
[facility] Cardiology  SUBJECTIVE: intubated   Filed Vitals:   06/12/15 0400 06/12/15 0500 06/12/15 0542 06/12/15 0758  BP: 90/48 98/51    Pulse: 95 93    Temp: 98.8 F (37.1 C) 99 F (37.2 C)    TempSrc:      Resp: 22 22    Height:      Weight:   54.9 kg (121 lb 0.5 oz)   SpO2: 93% 94%  93%     Intake/Output Summary (Last 24 hours) at 06/12/15 1054 Last data filed at 06/12/15 0536  Gross per 24 hour  Intake 3233.49 ml  Output     60 ml  Net 3173.49 ml      PHYSICAL EXAM  General: intubated HEENT:  Normocephalic and atramatic Neck:  No JVD.  Lungs: Clear bilaterally to auscultation and percussion. Heart: HRRR . Normal S1 and S2 without gallops or murmurs.  Abdomen: Bowel sounds are positive, abdomen soft and non-tender  Msk:  Back normal, normal gait. Normal strength and tone for age. Extremities: No clubbing, cyanosis or edema.   Neuro: comatose Psych:  comatose   LABS: Basic Metabolic Panel:  Recent Labs  06/11/15 0229 06/12/15 0330  NA 139 139  K 5.0 5.3*  CL 104 109  CO2 27 21*  GLUCOSE 260* 159*  BUN 29* 49*  CREATININE 1.63* 3.13*  CALCIUM 7.4* 7.2*  MG 1.6*  --   PHOS 6.6*  --    Liver Function Tests:  Recent Labs  06/11/15 0229 06/12/15 0330  AST >2275* 2031*  ALT 2215* 1958*  ALKPHOS 379* 350*  BILITOT 1.4* 0.8  PROT 5.2* 5.3*  ALBUMIN 2.5* 2.4*    Recent Labs  06/10/15 1930  LIPASE 46   CBC:  Recent Labs  06/10/15 1930 06/11/15 0229 06/12/15 0330  WBC 21.1* 26.4* 19.0*  NEUTROABS 19.6*  --   --   HGB 10.2* 10.9* 10.6*  HCT 35.3 36.7 35.5  MCV 69.1* 68.5* 68.3*  PLT 219 211 178   Cardiac Enzymes:  Recent Labs  06/11/15 1351 06/11/15 2252 06/12/15 0540  TROPONINI 17.69* 15.54* 12.20*   BNP: Invalid input(s): POCBNP D-Dimer: No results for input(s): DDIMER in the last 72 hours. Hemoglobin A1C: No results for input(s): HGBA1C in the last 72 hours. Fasting Lipid Panel:  Recent Labs  06/11/15 0229  TRIG 95    Thyroid Function Tests: No results for input(s): TSH, T4TOTAL, T3FREE, THYROIDAB in the last 72 hours.  Invalid input(s): FREET3 Anemia Panel: No results for input(s): VITAMINB12, FOLATE, FERRITIN, TIBC, IRON, RETICCTPCT in the last 72 hours.  Ct Abdomen Pelvis Wo Contrast  06/10/2015  CLINICAL DATA:  Respiratory arrest. Abnormal abdominal radiograph with distended bowel. EXAM: CT ABDOMEN AND PELVIS WITHOUT CONTRAST TECHNIQUE: Multidetector CT imaging of the abdomen and pelvis was performed following the standard protocol without IV contrast. COMPARISON:  Radiography same day.  CT abdomen 12/28/2014. FINDINGS: There is a pleural effusion on the right. There is volume loss and likely pneumonia in the right lower lobe. Left lung bases largely clear. Mild patchy density in the lingula could represent mild pneumonia. The heart is enlarged. No pericardial fluid. The liver has a normal appearance without contrast. Previous cholecystectomy. The spleen is at the upper limits normal in size. No abnormality of the pancreas is seen. Both kidneys contain numerous small calcifications consistent with nonobstructing stones. No evidence of active renal disease. There is atherosclerosis of the aorta and its branches but no aneurysm. 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 Maximal dimension is 5.6 cm. Nasogastric tube enters the stomach and has its tip in the distal body. The stomach is now decompressed. There are no worrisome dilated loops of intestine presently. Anastomotic bowel sutures are evident in the transverse colon. There has probably been right colectomy. There is a large amount of fecal matter in the rectosigmoid. IMPRESSION: There is no longer any distended bowel. Nasogastric tube in the stomach. No suspicion of ileus or obstruction based on the bowel gas pattern presently. Fairly large  amount of stool in the rectosigmoid colon. Right pleural effusion. Probable right lower lobe pneumonia and possible lingular pneumonia. Extensive atherosclerosis. 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 No significant change since the last study when using the same measurement planes. Electronically Signed   By: Nelson Chimes M.D.   On: 06/10/2015 23:55   Ct Head Wo Contrast  06/11/2015  CLINICAL DATA:  Found unresponsive, altered mental status, intubated EXAM: CT HEAD WITHOUT CONTRAST TECHNIQUE: Contiguous axial images were obtained from the base of the skull through the vertex without contrast. COMPARISON:  06/10/2015 FINDINGS: Stable mild brain atrophy pattern without acute intracranial hemorrhage, mass lesion, definite infarction, hydrocephalus, midline shift, herniation, or extra-axial fluid collection. No focal mass effect or edema. Cisterns remain patent. Cerebellar atrophy as well. Atherosclerosis of the intracranial vessels of the skullbase. Mastoids remain clear. Minor scattered sinus mucosal thickening. Orbits are symmetric. IMPRESSION: Stable atrophy pattern. No interval change or acute process by noncontrast CT Electronically Signed   By: Jerilynn Mages.  Shick M.D.   On: 06/11/2015 09:20   Ct Head Wo Contrast  06/10/2015  CLINICAL DATA:  Patient found unresponsive. Respiratory arrest. Hypoglycemia. Initial encounter. EXAM: CT HEAD WITHOUT CONTRAST TECHNIQUE: Contiguous axial images were obtained from the base of the skull through the vertex without intravenous contrast. COMPARISON:  CT of the head performed 04/07/2015 FINDINGS: There is no evidence of acute infarction, mass lesion, or intra- or extra-axial hemorrhage on CT. Prominence of the ventricles and sulci suggests mild cortical volume loss. The brainstem and fourth ventricle are within normal limits. The basal ganglia are unremarkable in appearance. The cerebral hemispheres demonstrate  grossly normal gray-white differentiation. No mass effect or midline shift is seen. There is no evidence of fracture; visualized osseous structures are unremarkable in appearance. The orbits are within normal limits. Mucosal thickening is noted at the right maxillary sinus. The remaining paranasal sinuses and mastoid air cells are well-aerated. No significant soft tissue abnormalities are seen. IMPRESSION: 1. No acute intracranial pathology seen on CT. 2. Mild cortical volume loss. 3. Mucosal thickening at the right maxillary sinus. Electronically Signed   By: Garald Balding M.D.   On: 06/10/2015 23:48   Dg Chest Port 1 View  06/12/2015  CLINICAL DATA:  69 year old female with respiratory failure. EXAM: PORTABLE CHEST 1 VIEW COMPARISON:  06/10/2015 and prior radiograph FINDINGS: Cardiomegaly and left AICD again noted. An endotracheal tube with tip 3.7 cm above the carina, left IJ central venous catheter with tip overlying the upper SVC and NG tube entering the stomach with tip off the field of view again noted. Probable small bilateral pleural effusions again noted. There has been little interval change since the prior study. IMPRESSION: Unchanged appearance of the chest with cardiomegaly, probable small bilateral pleural effusions and support apparatus as described. Electronically Signed   By:  Margarette Canada M.D.   On: 06/12/2015 08:01   Dg Chest Portable 1 View  06/10/2015  CLINICAL DATA:  Post CVC placement. EXAM: PORTABLE CHEST 1 VIEW COMPARISON:  06/10/2015 FINDINGS: Endotracheal tube is in place with tip in the lower trachea, estimated to be 3.3 cm above the carina. Nasogastric tube is in place with tip off the film but beyond the gastroesophageal junction. Left-sided AICD lead to the right ventricle. A left IJ central line has been placed, tip overlying the level of superior vena cava. Cardiomegaly. Small bilateral pleural effusions. Minimal patchy density at the right lung base persists. No  pneumothorax. IMPRESSION: 1. Interval placement of left IJ central line. 2. Stable cardiomegaly.  Minimal right lower lobe infiltrate. Electronically Signed   By: Nolon Nations M.D.   On: 06/10/2015 21:51   Dg Chest Portable 1 View  06/10/2015  CLINICAL DATA:  Patient unresponsive.  Initial encounter. EXAM: PORTABLE CHEST 1 VIEW COMPARISON:  Chest radiograph and CTA of the chest performed 05/21/2015 FINDINGS: The patient's endotracheal tube is seen ending 4 cm above the carina. An enteric tube is noted extending below the diaphragm. Right basilar airspace opacity is concerning for pneumonia. The left lung appears relatively clear. No pleural effusion or pneumothorax is seen. The cardiomediastinal silhouette is borderline enlarged. An AICD is noted overlying the left chest wall, with a single lead ending overlying the right ventricle. No acute osseous abnormalities are identified. IMPRESSION: 1. Endotracheal tube seen ending 4 cm above the carina. 2. Right basilar airspace opacity is concerning for pneumonia. 3. Borderline cardiomegaly. Electronically Signed   By: Garald Balding M.D.   On: 06/10/2015 20:21   Dg Abd Portable 1v  06/10/2015  CLINICAL DATA:  Patient unresponsive, acute onset. Initial encounter. EXAM: PORTABLE ABDOMEN - 1 VIEW COMPARISON:  CT of the abdomen and pelvis from 12/28/2014 FINDINGS: An enteric tube is noted ending overlying the body of the stomach. There is distention of the stomach and a colonic loop. This may reflect mild ileus. Evaluation for free intra-abdominal air is limited on a single supine view, though no definite free air is seen. Stool is noted within the sigmoid colon. Clips are noted within the right upper quadrant, reflecting prior cholecystectomy. And AICD lead is noted ending overlying the right ventricle. Mild right basilar airspace opacity is seen. IMPRESSION: 1. Enteric tube noted ending overlying the body of the stomach. 2. Distention of the stomach and colonic  loops may reflect mild ileus. No definite free intra-abdominal air seen, though evaluation for free air is limited on a single supine view. Stool noted within the sigmoid colon. 3. Mild right basilar airspace opacity is concerning for pneumonia. Electronically Signed   By: Garald Balding M.D.   On: 06/10/2015 20:20     Echo EF 15-25% by echocardiogram 02/01/15  TELEMETRY: normal sinus rhythm:  ASSESSMENT AND PLAN:  Principal Problem:   Septic shock ([place]) Active Problems:   CAD (coronary artery disease)   COPD mixed type (HCC)   HTN (hypertension)   Chronic combined systolic and diastolic CHF (congestive heart failure) (HCC)   HCAP (healthcare-associated pneumonia)   Acute on chronic respiratory failure with hypoxia (HCC)   Multi-organ system dysfunction   Pressure ulcer    1. Non-ST elevation myocardial infarction, known severe two-vessel coronary artery disease by cardiac catheterization 01/14/15, not felt to be amenable to revascularization 2. Known severe ischemic cardiomyopathy 3. Septic shock, multi-organ failure, very poor prognosis  Recommendations  1. Agree with overall current  therapy 2. Defer cardiac catheterization  Sign off for now, please call if any questions   Regino Fournet, MD, PhD, [facility] 06/12/2015 10:54 AM

## 2015-06-12 NOTE — Progress Notes (Signed)
ANTICOAGULATION CONSULT NOTE - Initial Consult  Pharmacy Consult for heparin gtt  Indication: chest pain/ACS  Allergies  Allergen Reactions  . Nsaids Nausea And Vomiting and Other (See Comments)    Reaction:  GI distress and burning     Patient Measurements: Height: 5\' 2"  (157.5 cm) Weight: 108 lb 11 oz (49.3 kg) IBW/kg (Calculated) : 50.1 Heparin Dosing Weight: 49.3 kg  Vital Signs: Temp: 98.2 F (36.8 C) (11/13 0100) Temp Source: Other (Comment) (11/13 0000) BP: 100/64 mmHg (11/13 0100) Pulse Rate: 94 (11/13 0100)  Labs:  Recent Labs  06/10/15 1930 06/11/15 0229 06/11/15 1351 06/11/15 2252  HGB 10.2* 10.9*  --   --   HCT 35.3 36.7  --   --   PLT 219 211  --   --   APTT 41*  --  30  --   LABPROT 24.7*  --  22.0*  --   INR 2.26  --  1.93  --   HEPARINUNFRC  --   --   --  <0.10*  CREATININE 1.71* 1.63*  --   --   TROPONINI 0.76* 8.37* 17.69* 15.54*    Estimated Creatinine Clearance: 25.7 mL/min (by C-G formula based on Cr of 1.63).   Medical History: Past Medical History  Diagnosis Date  . COPD (chronic obstructive pulmonary disease) ([place])   . Hypertension   . Ischemic cardiomyopathy     a.   . Coronary artery disease     a. s/p multiple stenting in 2005  . History of ventricular tachycardia   . PVD (peripheral vascular disease) ([place])   . PAD (peripheral artery disease) ([place])   . HLD (hyperlipidemia)   . Chronic back pain   . MI, old   . MRSA (methicillin resistant staph aureus) culture positive     left foot wound  . CHF (congestive heart failure) ([place])   . Pneumonia   . Asthma   . Cancer [facility])     Assessment: Pharmacy consulted to dose heparin gtt in this 69 year old female s/p respiratory arrest who was found unresponsive by her daughter. Heparin infusion was started overnight but was stopped around 0830 this AM due to concern for bleeding. Patient had CT which was negative for hemorrhage. Troponin increased and now is at 17.69 mg/mL. MD would  like heparin gtt restarted at this time.  Goal of Therapy:  Heparin level 0.3-0.7 units/ml Monitor platelets by anticoagulation protocol: Yes   Plan:  Give 2000 units bolus x 1 Start heparin infusion at 600 units/hr Check anti-Xa level in 8 hours and daily while on heparin Continue to monitor H&H and platelets   1112 2252 HL subtherapeutic, 750 unit IV x 1 bolus and increase rate to 700 units/hr. Will recheck level in 8 hours.  Laural Benes, Pharm.D.  Clinical Pharmacist 06/12/2015,3:01 AM

## 2015-06-12 NOTE — Progress Notes (Signed)
ANTIBIOTIC CONSULT NOTE - FOLLOW UP   Pharmacy Consult for vancomycin/Zosyn Indication: pneumonia  Allergies  Allergen Reactions  . Nsaids Nausea And Vomiting and Other (See Comments)    Reaction:  GI distress and burning     Patient Measurements: Height: 5\' 2"  (157.5 cm) Weight: 121 lb 0.5 oz (54.9 kg) IBW/kg (Calculated) : 50.1 Adjusted Body Weight:   Vital Signs: Temp: 99 F (37.2 C) (11/13 0500) BP: 98/51 mmHg (11/13 0500) Pulse Rate: 93 (11/13 0500) Intake/Output from previous day: 11/12 0701 - 11/13 0700 In: 3248.5 [I.V.:1718.5; NG/GT:30; IV Piggyback:1500] Out: 60 [Urine:60] Intake/Output from this shift:    Labs:  Recent Labs  06/10/15 1930 06/11/15 0229 06/12/15 0330  WBC 21.1* 26.4* 19.0*  HGB 10.2* 10.9* 10.6*  PLT 219 211 178  CREATININE 1.71* 1.63* 3.13*   Estimated Creatinine Clearance: 13.6 mL/min (by C-G formula based on Cr of 3.13). No results for input(s): VANCOTROUGH, VANCOPEAK, VANCORANDOM, GENTTROUGH, GENTPEAK, GENTRANDOM, TOBRATROUGH, TOBRAPEAK, TOBRARND, AMIKACINPEAK, AMIKACINTROU, AMIKACIN in the last 72 hours.   Microbiology: Recent Results (from the past 720 hour(s))  Culture, blood (routine x 2)     Status: None   Collection Time: 05/21/15 10:49 PM  Result Value Ref Range Status   Specimen Description BLOOD RIGHT HAND  Final   Special Requests BOTTLES DRAWN AEROBIC AND ANAEROBIC 4CC  Final   Culture NO GROWTH 5 DAYS  Final   Report Status 05/26/2015 FINAL  Final  Culture, blood (routine x 2)     Status: None   Collection Time: 05/21/15 11:00 PM  Result Value Ref Range Status   Specimen Description BLOOD LEFT ASSIST CONTROL  Final   Special Requests BOTTLES DRAWN AEROBIC AND ANAEROBIC 4CC  Final   Culture NO GROWTH 5 DAYS  Final   Report Status 05/26/2015 FINAL  Final  MRSA PCR Screening     Status: None   Collection Time: 05/22/15  1:46 AM  Result Value Ref Range Status   MRSA by PCR NEGATIVE NEGATIVE Final    Comment:         The GeneXpert MRSA Assay (FDA approved for NASAL specimens only), is one component of a comprehensive MRSA colonization surveillance program. It is not intended to diagnose MRSA infection nor to guide or monitor treatment for MRSA infections.   Urine culture     Status: None   Collection Time: 06/10/15  7:30 PM  Result Value Ref Range Status   Specimen Description URINE, RANDOM  Final   Special Requests NONE  Final   Culture NO GROWTH 2 DAYS  Final   Report Status 06/12/2015 FINAL  Final  Blood Culture (routine x 2)     Status: None (Preliminary result)   Collection Time: 06/10/15  8:10 PM  Result Value Ref Range Status   Specimen Description BLOOD RIGHT  Final   Special Requests BOTTLES DRAWN AEROBIC AND ANAEROBIC 5CC  Final   Culture NO GROWTH 2 DAYS  Final   Report Status PENDING  Incomplete  Blood Culture (routine x 2)     Status: None (Preliminary result)   Collection Time: 06/10/15  8:20 PM  Result Value Ref Range Status   Specimen Description BLOOD RIGHT  Final   Special Requests BOTTLES DRAWN AEROBIC AND ANAEROBIC 2CC AERO, 4CC  Final   Culture NO GROWTH 2 DAYS  Final   Report Status PENDING  Incomplete  MRSA PCR Screening     Status: Abnormal   Collection Time: 06/11/15  1:40 AM  Result  Value Ref Range Status   MRSA by PCR POSITIVE (A) NEGATIVE Final    Comment:        The GeneXpert MRSA Assay (FDA approved for NASAL specimens only), is one component of a comprehensive MRSA colonization surveillance program. It is not intended to diagnose MRSA infection nor to guide or monitor treatment for MRSA infections. CRITICAL RESULT CALLED TO, READ BACK BY AND VERIFIED WITH: [facility] ALLEN AT [id number] 06/11/15.PMH   Culture, sputum-assessment     Status: None   Collection Time: 06/11/15  4:30 AM  Result Value Ref Range Status   Specimen Description TRACHEAL ASPIRATE  Final   Special Requests NONE  Final   Sputum evaluation THIS SPECIMEN IS ACCEPTABLE FOR SPUTUM  CULTURE  Final   Report Status 06/11/2015 FINAL  Final  Culture, respiratory (NON-Expectorated)     Status: None (Preliminary result)   Collection Time: 06/11/15  4:30 AM  Result Value Ref Range Status   Specimen Description TRACHEAL ASPIRATE  Final   Special Requests NONE Reflexed from [id number]  Final   Gram Stain PENDING  Incomplete   Culture HOLDING FOR POSSIBLE PATHOGEN  Final   Report Status PENDING  Incomplete    Medical History: Past Medical History  Diagnosis Date  . COPD (chronic obstructive pulmonary disease) ([place])   . Hypertension   . Ischemic cardiomyopathy     a.   . Coronary artery disease     a. s/p multiple stenting in 2005  . History of ventricular tachycardia   . PVD (peripheral vascular disease) ([place])   . PAD (peripheral artery disease) ([place])   . HLD (hyperlipidemia)   . Chronic back pain   . MI, old   . MRSA (methicillin resistant staph aureus) culture positive     left foot wound  . CHF (congestive heart failure) ([place])   . Pneumonia   . Asthma   . Cancer (HCC)     Medications:  Infusions:  . fentaNYL infusion INTRAVENOUS 150 mcg/hr (06/12/15 0934)  . heparin 700 Units/hr (06/12/15 0536)  . norepinephrine (LEVOPHED) Adult infusion 5 mcg/min (06/12/15 0934)  .  sodium bicarbonate  infusion 1000 mL 75 mL/hr at 06/12/15 1013   Assessment: 69 yof cc CP with CXR showing right basilar airspace opacity concerning for PNA. Starting broad spectrum ABX.   Vd 35.9 L, Ke 0.026 hr-1, T1/2 27 hr  11/13: Patient serum creatinine creatinine has increased significantly overnight (AKI). Serum creatinine was 1.63 now 3.13 mg/dl.  Goal of Therapy:  Vancomycin trough level 15-20 mcg/ml  Plan:  Expected duration 7 days with resolution of temperature and/or normalization of WBC.  Patient received last dose of Vancomycin 500 mg @ ~2200 on 11/12. Based on significant changes in renal function, will discontinue regimen and will dose patient's vancomycin per levels. Will  order Vancomycin random level to be drawn @ 21:00 on 11/14, which would be ~48 hours post dose. Based on kinetic parameters calculated. Patient has a half life of ~44 hours due to changes in renal function. Pharmacy to dose Vancomycin Upon evaluation random vancomycin level on 11/14.  Zosyn: Due patient's significant change in renal function, will change Zosyn from 3.375 g IV q8 hours to q12 hours.   Terrian Ridlon D, Pharm.D.  Clinical Pharmacist 06/12/2015,12:21 PM

## 2015-06-12 NOTE — Consult Note (Signed)
[facility] CONSULT NOTE    Date: 06/12/2015                  Patient Name:  Lynn Butler  MRN: [id number]  DOB: 1946/04/16  Age / Sex: 69 y.o., female         PCP: Juanell Fairly, MD                 Service Requesting Consult: Dr. Earleen Newport.                 Reason for Consult: Acute renal failure            History of Present Illness: Patient is a 69 y.o. female with a PMHx of COPD, hypertension, ischemic cardiomyopathy with multiple coronary artery stents, history of ventricular tachycardia, peripheral vascular disease, hyperlipidemia, chronic back pain, history of congestive heart failure, who was admitted to [facility] [place] on 06/10/2015 for evaluation of respiratory failure.   Patient unable to offer any history at this point in time. Per dictated history and physical it appears that the patient was found unresponsive by her daughter who subsequently called EMS. Her initial O2 saturations were found to be 50%. Patient has underlying severe COPD and is on home oxygen. Patient was found have severe metabolic derangements upon presentation here. She is currently maintained on the ventilator. She has poor baseline functional status. She appears to have normal kidney function at baseline with a creatinine of 0.7 x 1 05/21/2015. Upon presentation this time creatinine was 1.71. Creatinine today is up to 3.13. Patient is also essentially anuric at this point in time as urine output over the past 24 hours was found to be 60 cc. Case was discussed with pulmonary critical care in depth this morning.   Medications: Outpatient medications: Prescriptions prior to admission  Medication Sig Dispense Refill Last Dose  . albuterol (PROVENTIL HFA;VENTOLIN HFA) 108 (90 BASE) MCG/ACT inhaler Inhale 2 puffs into the lungs every 4 (four) hours as needed for wheezing or shortness of breath.   PRN at PRN  . atorvastatin (LIPITOR) 40 MG tablet Take 1 tablet (40 mg total) by mouth daily at 6 PM. 30  tablet 0 unknown at unknown  . benzonatate (TESSALON) 200 MG capsule Take 1 capsule (200 mg total) by mouth 3 (three) times daily as needed for cough. 20 capsule 0 PRN at PRN  . carvedilol (COREG) 3.125 MG tablet Take 1 tablet (3.125 mg total) by mouth 2 (two) times daily with a meal. 60 tablet 2 unknown at unknown  . collagenase (SANTYL) ointment Apply 1 application topically daily.   unknown at unknown   . diazepam (VALIUM) 10 MG tablet Take 1 tablet (10 mg total) by mouth every 12 (twelve) hours as needed for anxiety. 30 tablet 0 PRN at PRN  . feeding supplement, ENSURE ENLIVE, (ENSURE ENLIVE) LIQD Take 237 mLs by mouth 3 (three) times daily with meals. 237 mL 12 unknown at unknown  . guaiFENesin (MUCINEX) 600 MG 12 hr tablet Take 1 tablet (600 mg total) by mouth 2 (two) times daily. 20 tablet 0 unknown at unknown  . HYDROmorphone (DILAUDID) 4 MG tablet Take 1 tablet (4 mg total) by mouth every 6 (six) hours as needed for severe pain. 15 tablet 0 PRN at PRN  . ipratropium-albuterol (DUONEB) 0.5-2.5 (3) MG/3ML SOLN Take 3 mLs by nebulization every 6 (six) hours as needed. (Patient taking differently: Take 3 mLs by nebulization every 6 (six) hours as needed (for wheezing/shortness of  breath). ) 360 mL 0 PRN at PRN  . isosorbide mononitrate (IMDUR) 30 MG 24 hr tablet Take 1 tablet (30 mg total) by mouth daily. 30 tablet 0 unknown at unknown  . loperamide (IMODIUM A-D) 2 MG tablet Take 1 tablet (2 mg total) by mouth 4 (four) times daily as needed for diarrhea or loose stools. 30 tablet 0 PRN at PRN  . mometasone-formoterol (DULERA) 100-5 MCG/ACT AERO Inhale 2 puffs into the lungs 2 (two) times daily.   unknown at unknown  . OxyCODONE (OXYCONTIN) 80 mg T12A 12 hr tablet Take 1 tablet (80 mg total) by mouth every 12 (twelve) hours. 14 tablet 0 unknown at unknown  . promethazine (PHENERGAN) 25 MG tablet Take 25 mg by mouth daily as needed for nausea or vomiting.   PRN at PRN  . tiotropium (SPIRIVA) 18 MCG  inhalation capsule Place 1 capsule (18 mcg total) into inhaler and inhale daily. 30 capsule 12 unknown at unknown   . zolpidem (AMBIEN) 10 MG tablet Take 5 mg by mouth at bedtime as needed for sleep.   PRN at PRN    Current medications: Current Facility-Administered Medications  Medication Dose Route Frequency Provider Last Rate Last Dose  . acetaminophen (TYLENOL) tablet 650 mg  650 mg Oral Q6H PRN Lance Coon, MD       Or  . acetaminophen (TYLENOL) suppository 650 mg  650 mg Rectal Q6H PRN Lance Coon, MD      . antiseptic oral rinse solution (CORINZ)  7 mL Mouth Rinse QID Wilhelmina Mcardle, MD   7 mL at 06/12/15 0320  . aspirin chewable tablet 81 mg  81 mg Per Tube Daily Wilhelmina Mcardle, MD   81 mg at 06/11/15 1600  . budesonide (PULMICORT) nebulizer solution 0.25 mg  0.25 mg Nebulization BID Wilhelmina Mcardle, MD   0.25 mg at 06/12/15 0755  . chlorhexidine gluconate (PERIDEX) 0.12 % solution 15 mL  15 mL Mouth Rinse BID Wilhelmina Mcardle, MD   15 mL at 06/12/15 0813  . Chlorhexidine Gluconate Cloth 2 % PADS 6 each  6 each Topical Q0600 Lance Coon, MD   6 each at 06/11/15 0600  . feeding supplement (VITAL HIGH PROTEIN) liquid 1,000 mL  1,000 mL Per Tube Q24H Wilhelmina Mcardle, MD      . fentaNYL 2553mcg in NS 274mL (61mcg/ml) infusion-PREMIX  0-200 mcg/hr Intravenous Continuous Wilhelmina Mcardle, MD 15 mL/hr at 06/12/15 0934 150 mcg/hr at 06/12/15 0934  . heparin ADULT infusion 100 units/mL (25000 units/250 mL)  700 Units/hr Intravenous Continuous Loletha Grayer, MD 7 mL/hr at 06/12/15 0536 700 Units/hr at 06/12/15 0536  . hydrocortisone sodium succinate (SOLU-CORTEF) 100 MG injection 50 mg  50 mg Intravenous Q8H Wilhelmina Mcardle, MD   50 mg at 06/12/15 0318  . insulin aspart (novoLOG) injection 0-15 Units  0-15 Units Subcutaneous 6 times per day Wilhelmina Mcardle, MD   2 Units at 06/12/15 0323  . ipratropium-albuterol (DUONEB) 0.5-2.5 (3) MG/3ML nebulizer solution 3 mL  3 mL Nebulization Q6H Wilhelmina Mcardle, MD   3 mL at 06/12/15 0755  . mupirocin ointment (BACTROBAN) 2 % 1 application  1 application Nasal BID Lance Coon, MD   1 application at [id number]  . norepinephrine (LEVOPHED) 16 mg in dextrose 5 % 250 mL (0.064 mg/mL) infusion  0-20 mcg/min Intravenous Continuous Wilhelmina Mcardle, MD 4.7 mL/hr at 06/12/15 0934 5 mcg/min at 06/12/15 0934  . pantoprazole (PROTONIX) injection  40 mg  40 mg Intravenous Q24H Anders Simmonds, MD   40 mg at 06/12/15 0319  . phytonadione (VITAMIN K) tablet 5 mg  5 mg Per Tube Daily Loletha Grayer, MD   5 mg at 06/11/15 1743  . piperacillin-tazobactam (ZOSYN) IVPB 3.375 g  3.375 g Intravenous 3 times per day Lance Coon, MD   3.375 g at 06/12/15 0536  . polyvinyl alcohol (LIQUIFILM TEARS) 1.4 % ophthalmic solution 1 drop  1 drop Both Eyes Q4H PRN Lenis Noon, RPH      . sodium bicarbonate 100 mEq in dextrose 5 % 1,000 mL infusion   Intravenous Continuous Wilhelmina Mcardle, MD 75 mL/hr at 06/12/15 1013    . vancomycin (VANCOCIN) 500 mg in sodium chloride 0.9 % 100 mL IVPB  500 mg Intravenous Q24H Lance Coon, MD   500 mg at 06/11/15 2121      Allergies: Allergies  Allergen Reactions  . Nsaids Nausea And Vomiting and Other (See Comments)    Reaction:  GI distress and burning       Past Medical History: Past Medical History  Diagnosis Date  . COPD (chronic obstructive pulmonary disease) ([place])   . Hypertension   . Ischemic cardiomyopathy     a.   . Coronary artery disease     a. s/p multiple stenting in 2005  . History of ventricular tachycardia   . PVD (peripheral vascular disease) ([place])   . PAD (peripheral artery disease) ([place])   . HLD (hyperlipidemia)   . Chronic back pain   . MI, old   . MRSA (methicillin resistant staph aureus) culture positive     left foot wound  . CHF (congestive heart failure) ([place])   . Pneumonia   . Asthma   . Cancer [facility])      Past Surgical History: Past Surgical History  Procedure Laterality Date  .  Insert / replace / remove pacemaker    . Vascular bypass surgery Bilateral   . Cardiac catheterization N/A 01/14/2015    Procedure: Left Heart Cath;  Surgeon: Wellington Hampshire, MD;  Location: [place] [facility];  Service: Cardiovascular;  Laterality: N/A;  . Cardiac defibrillator placement    . Appendectomy    . Cesarean section    . Abdominal surgery       Family History: Family History  Problem Relation Age of Onset  . CAD Other   . Breast cancer Mother   . Heart disease Mother   . Coronary artery disease Father      Social History: Social History   Social History  . Marital Status: Widowed    Spouse Name: N/A  . Number of Children: N/A  . Years of Education: N/A   Occupational History  . Not on file.   Social History Main Topics  . Smoking status: Current Every Day Smoker -- 0.50 packs/day    Types: Cigarettes  . Smokeless tobacco: Never Used  . Alcohol Use: No  . Drug Use: No  . Sexual Activity: Not on file   Other Topics Concern  . Not on file   Social History Narrative     Review of Systems: Patient unable to provide as shes intubated and on the ventilator at the moment  Vital Signs: Blood pressure 98/51, pulse 93, temperature 99 F (37.2 C), temperature source Core (Comment), resp. rate 22, height 5\' 2"  (1.575 m), weight 54.9 kg (121 lb 0.5 oz), SpO2 93 %.  Weight trends: [employer]  06/11/15 0148 06/11/15 0215 06/12/15 0542  Weight: 49.3 kg (108 lb 11 oz) 49.3 kg (108 lb 11 oz) 54.9 kg (121 lb 0.5 oz)    Physical Exam: General: Critically ill appearing   Head: Large scar on her forehead  Eyes: Spontaneous EOMI noted  Nose: Mucous membranes moist, not inflammed, nonerythematous.  Throat: ETT noted to be in place  Neck: Supple, trachea midline.  Lungs:  Bilateral rhonchi, breath sounds vent assisted  Heart: S1S2 no rubs  Abdomen:  BS normoactive. Soft, Nondistended, non-tender.  No masses or organomegaly.  Extremities: 1+ b/l LE edema  noted  Neurologic: Currently intubated and on the ventilator  Skin: Large scar noted on forehead    Lab results: Basic Metabolic Panel:  Recent Labs Lab 06/10/15 1930 06/11/15 0229 06/12/15 0330  NA 137 139 139  K 5.4* 5.0 5.3*  CL 98* 104 109  CO2 24 27 21*  GLUCOSE 216* 260* 159*  BUN 21* 29* 49*  CREATININE 1.71* 1.63* 3.13*  CALCIUM 8.1* 7.4* 7.2*  MG  --  1.6*  --   PHOS  --  6.6*  --     Liver Function Tests:  Recent Labs Lab 06/10/15 1930 06/11/15 0229 06/12/15 0330  AST 1138* >2275* 2031*  ALT 643* 2215* 1958*  ALKPHOS 373* 379* 350*  BILITOT 1.2 1.4* 0.8  PROT 5.8* 5.2* 5.3*  ALBUMIN 2.6* 2.5* 2.4*    Recent Labs Lab 06/10/15 1930  LIPASE 46   No results for input(s): AMMONIA in the last 168 hours.  CBC:  Recent Labs Lab 06/10/15 1930 06/11/15 0229 06/12/15 0330  WBC 21.1* 26.4* 19.0*  NEUTROABS 19.6*  --   --   HGB 10.2* 10.9* 10.6*  HCT 35.3 36.7 35.5  MCV 69.1* 68.5* 68.3*  PLT 219 211 178    Cardiac Enzymes:  Recent Labs Lab 06/10/15 1930 06/11/15 0229 06/11/15 1351 06/11/15 2252 06/12/15 0540  TROPONINI 0.76* 8.37* 17.69* 15.54* 12.20*    BNP: Invalid input(s): POCBNP  CBG:  Recent Labs Lab 06/10/15 1907 06/11/15 2120 06/12/15 0010 06/12/15 0310 06/12/15 0656  GLUCAP 91 126* 140* 142* 109*    Microbiology: Results for orders placed or performed during the hospital encounter of 06/10/15  Urine culture     Status: None   Collection Time: 06/10/15  7:30 PM  Result Value Ref Range Status   Specimen Description URINE, RANDOM  Final   Special Requests NONE  Final   Culture NO GROWTH 2 DAYS  Final   Report Status 06/12/2015 FINAL  Final  Blood Culture (routine x 2)     Status: None (Preliminary result)   Collection Time: 06/10/15  8:10 PM  Result Value Ref Range Status   Specimen Description BLOOD RIGHT  Final   Special Requests BOTTLES DRAWN AEROBIC AND ANAEROBIC 5CC  Final   Culture NO GROWTH 2 DAYS  Final    Report Status PENDING  Incomplete  Blood Culture (routine x 2)     Status: None (Preliminary result)   Collection Time: 06/10/15  8:20 PM  Result Value Ref Range Status   Specimen Description BLOOD RIGHT  Final   Special Requests BOTTLES DRAWN AEROBIC AND ANAEROBIC 2CC AERO, 4CC  Final   Culture NO GROWTH 2 DAYS  Final   Report Status PENDING  Incomplete  MRSA PCR Screening     Status: Abnormal   Collection Time: 06/11/15  1:40 AM  Result Value Ref Range Status   MRSA by PCR POSITIVE (A) NEGATIVE  Final    Comment:        The GeneXpert MRSA Assay (FDA approved for NASAL specimens only), is one component of a comprehensive MRSA colonization surveillance program. It is not intended to diagnose MRSA infection nor to guide or monitor treatment for MRSA infections. CRITICAL RESULT CALLED TO, READ BACK BY AND VERIFIED WITH: [facility] ALLEN AT [id number] 06/11/15.PMH   Culture, sputum-assessment     Status: None   Collection Time: 06/11/15  4:30 AM  Result Value Ref Range Status   Specimen Description TRACHEAL ASPIRATE  Final   Special Requests NONE  Final   Sputum evaluation THIS SPECIMEN IS ACCEPTABLE FOR SPUTUM CULTURE  Final   Report Status 06/11/2015 FINAL  Final  Culture, respiratory (NON-Expectorated)     Status: None (Preliminary result)   Collection Time: 06/11/15  4:30 AM  Result Value Ref Range Status   Specimen Description TRACHEAL ASPIRATE  Final   Special Requests NONE Reflexed from [id number]  Final   Gram Stain PENDING  Incomplete   Culture HOLDING FOR POSSIBLE PATHOGEN  Final   Report Status PENDING  Incomplete    Coagulation Studies:  Recent Labs  06/10/15 1930 06/11/15 1351  LABPROT 24.7* 22.0*  INR 2.26 1.93    Urinalysis:  Recent Labs  06/10/15 1930  COLORURINE AMBER*  LABSPEC 1.018  PHURINE 5.0  GLUCOSEU NEGATIVE  HGBUR 1+*  BILIRUBINUR NEGATIVE  KETONESUR NEGATIVE  PROTEINUR 100*  NITRITE NEGATIVE  LEUKOCYTESUR 1+*      Imaging: Ct Abdomen  Pelvis Wo Contrast  06/10/2015  CLINICAL DATA:  Respiratory arrest. Abnormal abdominal radiograph with distended bowel. EXAM: CT ABDOMEN AND PELVIS WITHOUT CONTRAST TECHNIQUE: Multidetector CT imaging of the abdomen and pelvis was performed following the standard protocol without IV contrast. COMPARISON:  Radiography same day.  CT abdomen 12/28/2014. FINDINGS: There is a pleural effusion on the right. There is volume loss and likely pneumonia in the right lower lobe. Left lung bases largely clear. Mild patchy density in the lingula could represent mild pneumonia. The heart is enlarged. No pericardial fluid. The liver has a normal appearance without contrast. Previous cholecystectomy. The spleen is at the upper limits normal in size. No abnormality of the pancreas is seen. Both kidneys contain numerous small calcifications consistent with nonobstructing stones. No evidence of active renal disease. There is atherosclerosis of the aorta and its branches but no aneurysm. 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 Maximal dimension is 5.6 cm. Nasogastric tube enters the stomach and has its tip in the distal body. The stomach is now decompressed. There are no worrisome dilated loops of intestine presently. Anastomotic bowel sutures are evident in the transverse colon. There has probably been right colectomy. There is a large amount of fecal matter in the rectosigmoid. IMPRESSION: There is no longer any distended bowel. Nasogastric tube in the stomach. No suspicion of ileus or obstruction based on the bowel gas pattern presently. Fairly large amount of stool in the rectosigmoid colon. Right pleural effusion. Probable right lower lobe pneumonia and possible lingular pneumonia. Extensive atherosclerosis. 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 No  significant change since the last study when using the same measurement planes. Electronically Signed   By: Nelson Chimes M.D.   On: 06/10/2015 23:55   Ct Head Wo Contrast  06/11/2015  CLINICAL DATA:  Found unresponsive, altered  mental status, intubated EXAM: CT HEAD WITHOUT CONTRAST TECHNIQUE: Contiguous axial images were obtained from the base of the skull through the vertex without contrast. COMPARISON:  06/10/2015 FINDINGS: Stable mild brain atrophy pattern without acute intracranial hemorrhage, mass lesion, definite infarction, hydrocephalus, midline shift, herniation, or extra-axial fluid collection. No focal mass effect or edema. Cisterns remain patent. Cerebellar atrophy as well. Atherosclerosis of the intracranial vessels of the skullbase. Mastoids remain clear. Minor scattered sinus mucosal thickening. Orbits are symmetric. IMPRESSION: Stable atrophy pattern. No interval change or acute process by noncontrast CT Electronically Signed   By: Jerilynn Mages.  Shick M.D.   On: 06/11/2015 09:20   Ct Head Wo Contrast  06/10/2015  CLINICAL DATA:  Patient found unresponsive. Respiratory arrest. Hypoglycemia. Initial encounter. EXAM: CT HEAD WITHOUT CONTRAST TECHNIQUE: Contiguous axial images were obtained from the base of the skull through the vertex without intravenous contrast. COMPARISON:  CT of the head performed 04/07/2015 FINDINGS: There is no evidence of acute infarction, mass lesion, or intra- or extra-axial hemorrhage on CT. Prominence of the ventricles and sulci suggests mild cortical volume loss. The brainstem and fourth ventricle are within normal limits. The basal ganglia are unremarkable in appearance. The cerebral hemispheres demonstrate grossly normal gray-white differentiation. No mass effect or midline shift is seen. There is no evidence of fracture; visualized osseous structures are unremarkable in appearance. The orbits are within normal limits. Mucosal thickening is noted at the right maxillary  sinus. The remaining paranasal sinuses and mastoid air cells are well-aerated. No significant soft tissue abnormalities are seen. IMPRESSION: 1. No acute intracranial pathology seen on CT. 2. Mild cortical volume loss. 3. Mucosal thickening at the right maxillary sinus. Electronically Signed   By: Garald Balding M.D.   On: 06/10/2015 23:48   Dg Chest Port 1 View  06/12/2015  CLINICAL DATA:  69 year old female with respiratory failure. EXAM: PORTABLE CHEST 1 VIEW COMPARISON:  06/10/2015 and prior radiograph FINDINGS: Cardiomegaly and left AICD again noted. An endotracheal tube with tip 3.7 cm above the carina, left IJ central venous catheter with tip overlying the upper SVC and NG tube entering the stomach with tip off the field of view again noted. Probable small bilateral pleural effusions again noted. There has been little interval change since the prior study. IMPRESSION: Unchanged appearance of the chest with cardiomegaly, probable small bilateral pleural effusions and support apparatus as described. Electronically Signed   By: Margarette Canada M.D.   On: 06/12/2015 08:01   Dg Chest Portable 1 View  06/10/2015  CLINICAL DATA:  Post CVC placement. EXAM: PORTABLE CHEST 1 VIEW COMPARISON:  06/10/2015 FINDINGS: Endotracheal tube is in place with tip in the lower trachea, estimated to be 3.3 cm above the carina. Nasogastric tube is in place with tip off the film but beyond the gastroesophageal junction. Left-sided AICD lead to the right ventricle. A left IJ central line has been placed, tip overlying the level of superior vena cava. Cardiomegaly. Small bilateral pleural effusions. Minimal patchy density at the right lung base persists. No pneumothorax. IMPRESSION: 1. Interval placement of left IJ central line. 2. Stable cardiomegaly.  Minimal right lower lobe infiltrate. Electronically Signed   By: Nolon Nations M.D.   On: 06/10/2015 21:51   Dg Chest Portable 1 View  06/10/2015  CLINICAL DATA:  Patient  unresponsive.  Initial encounter. EXAM: PORTABLE CHEST 1 VIEW COMPARISON:  Chest radiograph and CTA of the chest performed 05/21/2015 FINDINGS: The patient's endotracheal tube is seen ending 4 cm above the  carina. An enteric tube is noted extending below the diaphragm. Right basilar airspace opacity is concerning for pneumonia. The left lung appears relatively clear. No pleural effusion or pneumothorax is seen. The cardiomediastinal silhouette is borderline enlarged. An AICD is noted overlying the left chest wall, with a single lead ending overlying the right ventricle. No acute osseous abnormalities are identified. IMPRESSION: 1. Endotracheal tube seen ending 4 cm above the carina. 2. Right basilar airspace opacity is concerning for pneumonia. 3. Borderline cardiomegaly. Electronically Signed   By: Garald Balding M.D.   On: 06/10/2015 20:21   Dg Abd Portable 1v  06/10/2015  CLINICAL DATA:  Patient unresponsive, acute onset. Initial encounter. EXAM: PORTABLE ABDOMEN - 1 VIEW COMPARISON:  CT of the abdomen and pelvis from 12/28/2014 FINDINGS: An enteric tube is noted ending overlying the body of the stomach. There is distention of the stomach and a colonic loop. This may reflect mild ileus. Evaluation for free intra-abdominal air is limited on a single supine view, though no definite free air is seen. Stool is noted within the sigmoid colon. Clips are noted within the right upper quadrant, reflecting prior cholecystectomy. And AICD lead is noted ending overlying the right ventricle. Mild right basilar airspace opacity is seen. IMPRESSION: 1. Enteric tube noted ending overlying the body of the stomach. 2. Distention of the stomach and colonic loops may reflect mild ileus. No definite free intra-abdominal air seen, though evaluation for free air is limited on a single supine view. Stool noted within the sigmoid colon. 3. Mild right basilar airspace opacity is concerning for pneumonia. Electronically Signed   By:  Garald Balding M.D.   On: 06/10/2015 20:20      Assessment & Plan: Pt is a 69 y.o. yo female with a PMHx of COPD, hypertension, ischemic cardiomyopathy with multiple coronary artery stents, history of ventricular tachycardia, peripheral vascular disease, hyperlipidemia, chronic back pain, history of congestive heart failure, who was admitted to [facility] on 06/10/2015 for evaluation of respiratory failure.     1. Acute renal failure secondary to acute tubular necrosis. Patient presented with multiorgan failure upon presentation. Baseline creatinine 0.75. -Patient is critically ill at this point in time. Apparently she has very poor baseline functional status. Creatinine is currently up to 3.13 and the patient is essentially anuric at this point in time. Potassium also slightly high at 5.3.  Dr. Alva Garnet to discuss with family regarding goals of care and how aggressive they would like to be in the current situation. We agreed to hold off on renal replacement therapy until this is clarified. If the family opts for more aggressive care we would be happy to rediscuss renal replacement therapy.  2. Hyperkalemia. Serum potassium found to be 5.3 as above. May continue to monitor for now.  3. Acute respiratory failure. The patient is currently maintained on the ventilator. Goals of care to be addressed with the family. She may potentially be terminally weaned from the ventilator.  4. Acute myocardial infarction. Troponin peaked at 17 and is currently down to 12.2. This is further evidence of multiorgan dysfunction.  5. Thanks for consultation.

## 2015-06-12 NOTE — Progress Notes (Signed)
Patient ID: Lynn Butler, female   DOB: Jul 27, 1946, 69 y.o.   MRN: [id number]  Spoke with daughter earlier at the bedside at length. Discussed overall prognosis and CODE STATUS and dialysis. She will talk with other family members and then get back to me. Currently still a full code.  Dr. Loletha Grayer.

## 2015-06-12 NOTE — Progress Notes (Signed)
Gave Spiritual & pastoral support to the daughter.  I told her to have the nurse call me should she need me for anything.  Her fiance was going to come up to set with her some. [place] 1200

## 2015-06-12 NOTE — Progress Notes (Signed)
PULMONARY / CRITICAL CARE MEDICINE   Name: Lynn Butler MRN: [id number] DOB: 1945-11-27    ADMISSION DATE:  06/10/2015 CONSULTATION DATE:  11/12  HPI:  60 F was in her USOH (which is generally poor) on the morning of admission. Later was noted to be increasingly weak with AMS. Transported to Ed and ultimately intubated. Briefly hypotensive requiring vasopressors  MAJOR EVENTS/TEST RESULTS: 11/11 CTAP: NAD. Severe atherosclerosis 11/11 CT head: NAD 11/12 CT head: NAD 11/13 Worsening renal function. Renal consultation. Deemed poor candidate for HD/CRRT of any duration. Goals of care discussed with pt's daughter  INDWELLING DEVICES:: ETT 11/11 >>  L IJ CVL 11/11 >>  MICRO DATA: Urine 11/11 >> NEG Blood 11/11 >>  MRSA PCR 11/12 >> POS Resp 11/12 >>     ANTIMICROBIALS: Vanc 11/11 >>  Pip-tazo 11/11 >>    SUBJECTIVE:  Fentanyl gtt resumed 11/12 PM. RASS -3, -4. Not F/C. No distress  VITAL SIGNS: Temp:  [97.7 F (36.5 C)-100 F (37.8 C)] 98.8 F (37.1 C) (11/13 1700) Pulse Rate:  [85-100] 86 (11/13 1700) Resp:  [9-26] 16 (11/13 1700) BP: (90-114)/(41-98) 100/52 mmHg (11/13 1700) SpO2:  [91 %-100 %] 95 % (11/13 1700) FiO2 (%):  [30 %] 30 % (11/13 1542) Weight:  [54.9 kg (121 lb 0.5 oz)] 54.9 kg (121 lb 0.5 oz) (11/13 0542) HEMODYNAMICS:   VENTILATOR SETTINGS: Vent Mode:  [-] PRVC FiO2 (%):  [30 %] 30 % Set Rate:  [16 bmp-20 bmp] 16 bmp Vt Set:  [40 mL-400 mL] 400 mL PEEP:  [5 cmH20] 5 cmH20 INTAKE / OUTPUT:  Intake/Output Summary (Last 24 hours) at 06/12/15 1817 Last data filed at 06/12/15 1700  Gross per 24 hour  Intake 2650.75 ml  Output     20 ml  Net 2630.75 ml    PHYSICAL EXAMINATION: General: RASS -4, frail appearing, appears much older than chronological age Neuro: PERRL, minimal withdrawal from pain, DTRs symmetric HEENT: NCAT, forehead lesion Cardiovascular: reg, no M Lungs: no wheezes Abdomen: scaphoid, diminished BS Ext: severe atrophy,  no edema   LABS:  CBC  Recent Labs Lab 06/10/15 1930 06/11/15 0229 06/12/15 0330  WBC 21.1* 26.4* 19.0*  HGB 10.2* 10.9* 10.6*  HCT 35.3 36.7 35.5  PLT 219 211 178   Coag's  Recent Labs Lab 06/10/15 1930 06/11/15 1351 06/12/15 1101  APTT 41* 30 29  INR 2.26 1.93  --    BMET  Recent Labs Lab 06/10/15 1930 06/11/15 0229 06/12/15 0330  NA 137 139 139  K 5.4* 5.0 5.3*  CL 98* 104 109  CO2 24 27 21*  BUN 21* 29* 49*  CREATININE 1.71* 1.63* 3.13*  GLUCOSE 216* 260* 159*   Electrolytes  Recent Labs Lab 06/10/15 1930 06/11/15 0229 06/12/15 0330  CALCIUM 8.1* 7.4* 7.2*  MG  --  1.6*  --   PHOS  --  6.6*  --    Sepsis Markers  Recent Labs Lab 06/10/15 1930 06/11/15 0229 06/12/15 0330  LATICACIDVEN 6.7* 1.8  --   PROCALCITON  --   --  32.06   ABG  Recent Labs Lab 06/10/15 1925 06/10/15 2300 06/11/15 0600  PHART 7.19* 7.26* PENDING  PCO2ART 59* 53* 61*  PO2ART 92 146* 141*   Liver Enzymes  Recent Labs Lab 06/10/15 1930 06/11/15 0229 06/12/15 0330  AST 1138* >2275* 2031*  ALT 643* 2215* 1958*  ALKPHOS 373* 379* 350*  BILITOT 1.2 1.4* 0.8  ALBUMIN 2.6* 2.5* 2.4*   Cardiac Enzymes  Recent Labs Lab 06/11/15 1351 06/11/15 2252 06/12/15 0540  TROPONINI 17.69* 15.54* 12.20*   Glucose  Recent Labs Lab 06/11/15 2120 06/12/15 0010 06/12/15 0310 06/12/15 0656 06/12/15 1120 06/12/15 1652  GLUCAP 126* 140* 142* 109* 106* 138*    CXR: CM, no edema or infiltrates   ASSESSMENT / PLAN:  PULMONARY A: Acute resp failure Chronic COPD without acute bronchospasm P:   Cont full vent support - settings reviewed and/or adjusted Cont vent bundle Daily SBT if/when meets criteria  CARDIOVASCULAR A:  Acute MI Shock, resolved CAD - 2VD deemed to not be amenable to revascularization PVD P:  MAP goal > 65 mmHg Cards consultation: no role for cardiac intervention due to MODS, poor baseline  RENAL A:  AKI Mild metabolic  acidosis Mild hyperkalemia Very poor candidate for HD of any duration P:   Monitor BMET intermittently Monitor I/Os Correct electrolytes as indicated HCO3 gtt initiated 11/13 Renal consultation 11/13: deemed poor candidate for HD  GASTROINTESTINAL A:   No issues P:   SUP: IV PPI Initiate TFs 11/13  HEMATOLOGIC A:   Mild anemia without overt bleeding P:  DVT px: SQ heparin Monitor CBC intermittently Transfuse per usual guidelines  INFECTIOUS A:   Possible severe sepsis, unclear source Elevated PCT (11/13 32.06) P:   Monitor temp, WBC count Micro and abx as above Cont PCT algorithm  ENDOCRINE A:   Hyperglycemia without documented DM H/O frequent steroid dosing - suspect adrenal insuff P:   Cont SSI Cont stress dose steroids  NEUROLOGIC A:   Severe acute encephalopathy Severe baseline debilitation P:   RASS goal: -1, -2 Cont to address goals of care. I have strongly encouraged that we move towards palliative care  FAMILY  - Updates: daughter 11/13   CCM time:45 mins The above time includes time spent in consultation with patient and/or family members and reviewing care plan on multidisciplinary rounds  Merton Border, MD PCCM service Mobile [phone] Pager [phone]     06/12/2015, 6:17 PM

## 2015-06-12 NOTE — Progress Notes (Signed)
Patient ID: Lynn Butler, female   DOB: May 23, 1946, 69 y.o.   MRN: [id number] [facility] PROGRESS NOTE  PCP: Juanell Fairly, MD  HPI/Subjective: Patient on the ventilator.  Objective: Filed Vitals:   06/12/15 0500  BP: 98/51  Pulse: 93  Temp: 99 F (37.2 C)  Resp: 22    Filed Weights   06/11/15 0148 06/11/15 0215 06/12/15 0542  Weight: 49.3 kg (108 lb 11 oz) 49.3 kg (108 lb 11 oz) 54.9 kg (121 lb 0.5 oz)    ROS: Review of Systems  Unable to perform ROS  patient on ventilator Exam: Physical Exam  Constitutional: She is intubated.  HENT:  Nose: No mucosal edema.  Unable to look and mouth  Eyes:  Left pupil slightly larger than right pupil.  Neck: Carotid bruit is not present. No thyromegaly present.  Cardiovascular:  Murmur heard.  Systolic murmur is present with a grade of 2/6  Respiratory: She is intubated. She has decreased breath sounds in the right middle field, the right lower field and the left lower field. She has wheezes in the right lower field and the left lower field.  GI: Soft. There is no tenderness.  Musculoskeletal:       Right ankle: She exhibits no swelling.       Left ankle: She exhibits no swelling.  Neurological:  Sedated on the ventilator  Skin:  Patient had plastic surgery on her for head and face with skin grafts. Scab on the 4 head. Chronic wound on the left lower extremity.  Psychiatric:  Unable to assess on ventilator    Data Reviewed: Basic Metabolic Panel:  Recent Labs Lab 06/10/15 1930 06/11/15 0229 06/12/15 0330  NA 137 139 139  K 5.4* 5.0 5.3*  CL 98* 104 109  CO2 24 27 21*  GLUCOSE 216* 260* 159*  BUN 21* 29* 49*  CREATININE 1.71* 1.63* 3.13*  CALCIUM 8.1* 7.4* 7.2*  MG  --  1.6*  --   PHOS  --  6.6*  --    Liver Function Tests:  Recent Labs Lab 06/10/15 1930 06/11/15 0229 06/12/15 0330  AST 1138* >2275* 2031*  ALT 643* 2215* 1958*  ALKPHOS 373* 379* 350*  BILITOT 1.2 1.4* 0.8  PROT 5.8*  5.2* 5.3*  ALBUMIN 2.6* 2.5* 2.4*    Recent Labs Lab 06/10/15 1930  LIPASE 46   CBC:  Recent Labs Lab 06/10/15 1930 06/11/15 0229 06/12/15 0330  WBC 21.1* 26.4* 19.0*  NEUTROABS 19.6*  --   --   HGB 10.2* 10.9* 10.6*  HCT 35.3 36.7 35.5  MCV 69.1* 68.5* 68.3*  PLT 219 211 178   Cardiac Enzymes:  Recent Labs Lab 06/10/15 1930 06/11/15 0229 06/11/15 1351 06/11/15 2252 06/12/15 0540  TROPONINI 0.76* 8.37* 17.69* 15.54* 12.20*   BNP (last 3 results)  Recent Labs  01/20/15 2043 02/01/15 1432 04/07/15 1610  BNP 1333.0* 1345.0* 2338.0*     CBG:  Recent Labs Lab 06/11/15 2120 06/12/15 0010 06/12/15 0310 06/12/15 0656 06/12/15 1120  GLUCAP 126* 140* 142* 109* 106*    Recent Results (from the past 240 hour(s))  Urine culture     Status: None   Collection Time: 06/10/15  7:30 PM  Result Value Ref Range Status   Specimen Description URINE, RANDOM  Final   Special Requests NONE  Final   Culture NO GROWTH 2 DAYS  Final   Report Status 06/12/2015 FINAL  Final  Blood Culture (routine x 2)     Status:  None (Preliminary result)   Collection Time: 06/10/15  8:10 PM  Result Value Ref Range Status   Specimen Description BLOOD RIGHT  Final   Special Requests BOTTLES DRAWN AEROBIC AND ANAEROBIC 5CC  Final   Culture NO GROWTH 2 DAYS  Final   Report Status PENDING  Incomplete  Blood Culture (routine x 2)     Status: None (Preliminary result)   Collection Time: 06/10/15  8:20 PM  Result Value Ref Range Status   Specimen Description BLOOD RIGHT  Final   Special Requests BOTTLES DRAWN AEROBIC AND ANAEROBIC 2CC AERO, 4CC  Final   Culture NO GROWTH 2 DAYS  Final   Report Status PENDING  Incomplete  MRSA PCR Screening     Status: Abnormal   Collection Time: 06/11/15  1:40 AM  Result Value Ref Range Status   MRSA by PCR POSITIVE (A) NEGATIVE Final    Comment:        The GeneXpert MRSA Assay (FDA approved for NASAL specimens only), is one component of  a comprehensive MRSA colonization surveillance program. It is not intended to diagnose MRSA infection nor to guide or monitor treatment for MRSA infections. CRITICAL RESULT CALLED TO, READ BACK BY AND VERIFIED WITH: [facility] ALLEN AT [id number] 06/11/15.PMH   Culture, sputum-assessment     Status: None   Collection Time: 06/11/15  4:30 AM  Result Value Ref Range Status   Specimen Description TRACHEAL ASPIRATE  Final   Special Requests NONE  Final   Sputum evaluation THIS SPECIMEN IS ACCEPTABLE FOR SPUTUM CULTURE  Final   Report Status 06/11/2015 FINAL  Final  Culture, respiratory (NON-Expectorated)     Status: None (Preliminary result)   Collection Time: 06/11/15  4:30 AM  Result Value Ref Range Status   Specimen Description TRACHEAL ASPIRATE  Final   Special Requests NONE Reflexed from [id number]  Final   Gram Stain PENDING  Incomplete   Culture HOLDING FOR POSSIBLE PATHOGEN  Final   Report Status PENDING  Incomplete     Studies: Ct Abdomen Pelvis Wo Contrast  06/10/2015  CLINICAL DATA:  Respiratory arrest. Abnormal abdominal radiograph with distended bowel. EXAM: CT ABDOMEN AND PELVIS WITHOUT CONTRAST TECHNIQUE: Multidetector CT imaging of the abdomen and pelvis was performed following the standard protocol without IV contrast. COMPARISON:  Radiography same day.  CT abdomen 12/28/2014. FINDINGS: There is a pleural effusion on the right. There is volume loss and likely pneumonia in the right lower lobe. Left lung bases largely clear. Mild patchy density in the lingula could represent mild pneumonia. The heart is enlarged. No pericardial fluid. The liver has a normal appearance without contrast. Previous cholecystectomy. The spleen is at the upper limits normal in size. No abnormality of the pancreas is seen. Both kidneys contain numerous small calcifications consistent with nonobstructing stones. No evidence of active renal disease. There is atherosclerosis of the aorta and its branches but no  aneurysm. 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 Maximal dimension is 5.6 cm. Nasogastric tube enters the stomach and has its tip in the distal body. The stomach is now decompressed. There are no worrisome dilated loops of intestine presently. Anastomotic bowel sutures are evident in the transverse colon. There has probably been right colectomy. There is a large amount of fecal matter in the rectosigmoid. IMPRESSION: There is no longer any distended bowel. Nasogastric tube in the stomach. No suspicion of ileus  or obstruction based on the bowel gas pattern presently. Fairly large amount of stool in the rectosigmoid colon. Right pleural effusion. Probable right lower lobe pneumonia and possible lingular pneumonia. Extensive atherosclerosis. 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 No significant change since the last study when using the same measurement planes. Electronically Signed   By: Nelson Chimes M.D.   On: 06/10/2015 23:55   Ct Head Wo Contrast  06/11/2015  CLINICAL DATA:  Found unresponsive, altered mental status, intubated EXAM: CT HEAD WITHOUT CONTRAST TECHNIQUE: Contiguous axial images were obtained from the base of the skull through the vertex without contrast. COMPARISON:  06/10/2015 FINDINGS: Stable mild brain atrophy pattern without acute intracranial hemorrhage, mass lesion, definite infarction, hydrocephalus, midline shift, herniation, or extra-axial fluid collection. No focal mass effect or edema. Cisterns remain patent. Cerebellar atrophy as well. Atherosclerosis of the intracranial vessels of the skullbase. Mastoids remain clear. Minor scattered sinus mucosal thickening. Orbits are symmetric. IMPRESSION: Stable atrophy pattern. No interval change or acute process by noncontrast CT Electronically Signed   By: Jerilynn Mages.  Shick  M.D.   On: 06/11/2015 09:20   Ct Head Wo Contrast  06/10/2015  CLINICAL DATA:  Patient found unresponsive. Respiratory arrest. Hypoglycemia. Initial encounter. EXAM: CT HEAD WITHOUT CONTRAST TECHNIQUE: Contiguous axial images were obtained from the base of the skull through the vertex without intravenous contrast. COMPARISON:  CT of the head performed 04/07/2015 FINDINGS: There is no evidence of acute infarction, mass lesion, or intra- or extra-axial hemorrhage on CT. Prominence of the ventricles and sulci suggests mild cortical volume loss. The brainstem and fourth ventricle are within normal limits. The basal ganglia are unremarkable in appearance. The cerebral hemispheres demonstrate grossly normal gray-white differentiation. No mass effect or midline shift is seen. There is no evidence of fracture; visualized osseous structures are unremarkable in appearance. The orbits are within normal limits. Mucosal thickening is noted at the right maxillary sinus. The remaining paranasal sinuses and mastoid air cells are well-aerated. No significant soft tissue abnormalities are seen. IMPRESSION: 1. No acute intracranial pathology seen on CT. 2. Mild cortical volume loss. 3. Mucosal thickening at the right maxillary sinus. Electronically Signed   By: Garald Balding M.D.   On: 06/10/2015 23:48   Dg Chest Port 1 View  06/12/2015  CLINICAL DATA:  69 year old female with respiratory failure. EXAM: PORTABLE CHEST 1 VIEW COMPARISON:  06/10/2015 and prior radiograph FINDINGS: Cardiomegaly and left AICD again noted. An endotracheal tube with tip 3.7 cm above the carina, left IJ central venous catheter with tip overlying the upper SVC and NG tube entering the stomach with tip off the field of view again noted. Probable small bilateral pleural effusions again noted. There has been little interval change since the prior study. IMPRESSION: Unchanged appearance of the chest with cardiomegaly, probable small bilateral pleural  effusions and support apparatus as described. Electronically Signed   By: Margarette Canada M.D.   On: 06/12/2015 08:01   Dg Chest Portable 1 View  06/10/2015  CLINICAL DATA:  Post CVC placement. EXAM: PORTABLE CHEST 1 VIEW COMPARISON:  06/10/2015 FINDINGS: Endotracheal tube is in place with tip in the lower trachea, estimated to be 3.3 cm above the carina. Nasogastric tube is in place with tip off the film but beyond the gastroesophageal junction. Left-sided AICD lead to the right ventricle. A left IJ central line has been placed, tip overlying the level of superior vena cava. Cardiomegaly. Small bilateral pleural effusions.  Minimal patchy density at the right lung base persists. No pneumothorax. IMPRESSION: 1. Interval placement of left IJ central line. 2. Stable cardiomegaly.  Minimal right lower lobe infiltrate. Electronically Signed   By: Nolon Nations M.D.   On: 06/10/2015 21:51   Dg Chest Portable 1 View  06/10/2015  CLINICAL DATA:  Patient unresponsive.  Initial encounter. EXAM: PORTABLE CHEST 1 VIEW COMPARISON:  Chest radiograph and CTA of the chest performed 05/21/2015 FINDINGS: The patient's endotracheal tube is seen ending 4 cm above the carina. An enteric tube is noted extending below the diaphragm. Right basilar airspace opacity is concerning for pneumonia. The left lung appears relatively clear. No pleural effusion or pneumothorax is seen. The cardiomediastinal silhouette is borderline enlarged. An AICD is noted overlying the left chest wall, with a single lead ending overlying the right ventricle. No acute osseous abnormalities are identified. IMPRESSION: 1. Endotracheal tube seen ending 4 cm above the carina. 2. Right basilar airspace opacity is concerning for pneumonia. 3. Borderline cardiomegaly. Electronically Signed   By: Garald Balding M.D.   On: 06/10/2015 20:21   Dg Abd Portable 1v  06/10/2015  CLINICAL DATA:  Patient unresponsive, acute onset. Initial encounter. EXAM: PORTABLE  ABDOMEN - 1 VIEW COMPARISON:  CT of the abdomen and pelvis from 12/28/2014 FINDINGS: An enteric tube is noted ending overlying the body of the stomach. There is distention of the stomach and a colonic loop. This may reflect mild ileus. Evaluation for free intra-abdominal air is limited on a single supine view, though no definite free air is seen. Stool is noted within the sigmoid colon. Clips are noted within the right upper quadrant, reflecting prior cholecystectomy. And AICD lead is noted ending overlying the right ventricle. Mild right basilar airspace opacity is seen. IMPRESSION: 1. Enteric tube noted ending overlying the body of the stomach. 2. Distention of the stomach and colonic loops may reflect mild ileus. No definite free intra-abdominal air seen, though evaluation for free air is limited on a single supine view. Stool noted within the sigmoid colon. 3. Mild right basilar airspace opacity is concerning for pneumonia. Electronically Signed   By: Garald Balding M.D.   On: 06/10/2015 20:20    Scheduled Meds: . antiseptic oral rinse  7 mL Mouth Rinse QID  . aspirin  81 mg Per Tube Daily  . budesonide  0.25 mg Nebulization BID  . chlorhexidine gluconate  15 mL Mouth Rinse BID  . Chlorhexidine Gluconate Cloth  6 each Topical Q0600  . feeding supplement (VITAL HIGH PROTEIN)  1,000 mL Per Tube Q24H  . heparin  1,500 Units Intravenous Once  . hydrocortisone sod succinate (SOLU-CORTEF) inj  50 mg Intravenous Q8H  . insulin aspart  0-15 Units Subcutaneous 6 times per day  . ipratropium-albuterol  3 mL Nebulization Q6H  . mupirocin ointment  1 application Nasal BID  . pantoprazole (PROTONIX) IV  40 mg Intravenous Q24H  . phytonadione  5 mg Per Tube Daily  . piperacillin-tazobactam (ZOSYN)  IV  3.375 g Intravenous Q12H   Continuous Infusions: . fentaNYL infusion INTRAVENOUS 150 mcg/hr (06/12/15 0934)  . heparin 700 Units/hr (06/12/15 0536)  . norepinephrine (LEVOPHED) Adult infusion 5 mcg/min  (06/12/15 0934)  .  sodium bicarbonate  infusion 1000 mL 75 mL/hr at 06/12/15 1013    Assessment/Plan:  1. Septic shock with multiorgan failure. Overall prognosis is poor. High mortality. Patient is a full code. Patient is back on low-dose levophed. Stress dose steroids. Daughter on her way to  talk about her mother. 2. Sepsis with pneumonia right lung- triple antibiotic coverage. 3. Acute respiratory failure with hypoxia- continue full ventilatory support at this time, FiO2 30%. With history of end-stage COPD it may be difficult to get her off the ventilator. 4. Shock liver with coagulopathy- severely elevated LFTs. Supportive care 5. Acute myocardial infarction- likely secondary to septic shock. Continue heparin drip for 48 hours and aspirin. 6. Acute renal failure and oligoria- patient will need dialysis if family chooses to do so. 7. Chronic pain on numerous outpatient medications- on fentanyl drip for sedation 8. Acute encephalopathy likely secondary to septic shock 9. History of skin cancer status post plastic surgery procedure.  Code Status:     Code Status Orders        Start     Ordered   06/11/15 0142  Full code   Continuous     06/11/15 0141     Family Communication: Daughter on her way up to the ICU and will talk to her further when she gets here. Disposition Plan: To be determined  Consultants:  Critical care specialist  Cardiology  Nephrology  Time spent: 32 minutes critical care time  Loletha Grayer  [facility]

## 2015-06-13 ENCOUNTER — Inpatient Hospital Stay: Payer: Medicare Other

## 2015-06-13 DIAGNOSIS — A419 Sepsis, unspecified organism: Secondary | ICD-10-CM

## 2015-06-13 DIAGNOSIS — R6521 Severe sepsis with septic shock: Secondary | ICD-10-CM

## 2015-06-13 LAB — CBC
HCT: 28.9 % — ABNORMAL LOW (ref 35.0–47.0)
Hemoglobin: 8.7 g/dL — ABNORMAL LOW (ref 12.0–16.0)
MCH: 19.9 pg — AB (ref 26.0–34.0)
MCHC: 30 g/dL — AB (ref 32.0–36.0)
MCV: 66.2 fL — AB (ref 80.0–100.0)
PLATELETS: 152 10*3/uL (ref 150–440)
RBC: 4.36 MIL/uL (ref 3.80–5.20)
RDW: 22.7 % — ABNORMAL HIGH (ref 11.5–14.5)
WBC: 27.4 10*3/uL — ABNORMAL HIGH (ref 3.6–11.0)

## 2015-06-13 LAB — CBC WITH DIFFERENTIAL/PLATELET
BASOS ABS: 0 10*3/uL (ref 0–0.1)
BASOS PCT: 0 %
EOS PCT: 0 %
Eosinophils Absolute: 0 10*3/uL (ref 0–0.7)
HCT: 24.8 % — ABNORMAL LOW (ref 35.0–47.0)
Hemoglobin: 7.4 g/dL — ABNORMAL LOW (ref 12.0–16.0)
LYMPHS PCT: 2 %
Lymphs Abs: 0.6 10*3/uL — ABNORMAL LOW (ref 1.0–3.6)
MCH: 19.6 pg — ABNORMAL LOW (ref 26.0–34.0)
MCHC: 30 g/dL — ABNORMAL LOW (ref 32.0–36.0)
MCV: 65.3 fL — AB (ref 80.0–100.0)
Monocytes Absolute: 1.2 10*3/uL — ABNORMAL HIGH (ref 0.2–0.9)
Monocytes Relative: 5 %
NEUTROS ABS: 22.7 10*3/uL — AB (ref 1.4–6.5)
Neutrophils Relative %: 93 %
PLATELETS: 124 10*3/uL — AB (ref 150–440)
RBC: 3.79 MIL/uL — AB (ref 3.80–5.20)
RDW: 22.5 % — ABNORMAL HIGH (ref 11.5–14.5)
WBC: 24.5 10*3/uL — AB (ref 3.6–11.0)

## 2015-06-13 LAB — BLOOD GAS, ARTERIAL
ALLENS TEST (PASS/FAIL): POSITIVE — AB
Acid-base deficit: 6.5 mmol/L — ABNORMAL HIGH (ref 0.0–2.0)
Acid-base deficit: 7.1 mmol/L — ABNORMAL HIGH (ref 0.0–2.0)
Acid-base deficit: 3 mmol/L — ABNORMAL HIGH (ref 0.0–2.0)
Allens test (pass/fail): POSITIVE — AB
BICARBONATE: 22.5 meq/L (ref 21.0–28.0)
BICARBONATE: 22.3 meq/L (ref 21.0–28.0)
BICARBONATE: 23.6 meq/L (ref 21.0–28.0)
FIO2: 0.6
FIO2: 0.6
FIO2: 30
LHR: 12 {breaths}/min
LHR: 16 {breaths}/min
MECHANICAL RATE: 16
MECHVT: 350 mL
MECHVT: 400 mL
O2 SAT: 94.9 %
O2 SAT: 98.6 %
O2 SAT: 89.3 %
PATIENT TEMPERATURE: 37
PATIENT TEMPERATURE: 35.9
PATIENT TEMPERATURE: 37
PCO2 ART: 59 mmHg — AB (ref 32.0–48.0)
PCO2 ART: 61 mmHg — AB (ref 32.0–48.0)
PCO2 ART: 49 mmHg — AB (ref 32.0–48.0)
PEEP/CPAP: 5 cmH2O
PEEP: 5 cmH2O
PEEP: 10 cmH2O
PO2 ART: 92 mmHg (ref 83.0–108.0)
PO2 ART: 141 mmHg — AB (ref 83.0–108.0)
PO2 ART: 64 mmHg — AB (ref 83.0–108.0)
VT: 350 mL
pH, Arterial: 7.19 — CL (ref 7.350–7.450)
pH, Arterial: 7.1 — CL (ref 7.350–7.450)
pH, Arterial: 7.29 — ABNORMAL LOW (ref 7.350–7.450)

## 2015-06-13 LAB — MAGNESIUM: MAGNESIUM: 1.8 mg/dL (ref 1.7–2.4)

## 2015-06-13 LAB — BASIC METABOLIC PANEL
Anion gap: 8 (ref 5–15)
BUN: 73 mg/dL — AB (ref 6–20)
CHLORIDE: 103 mmol/L (ref 101–111)
CO2: 26 mmol/L (ref 22–32)
Calcium: 7 mg/dL — ABNORMAL LOW (ref 8.9–10.3)
Creatinine, Ser: 4.42 mg/dL — ABNORMAL HIGH (ref 0.44–1.00)
GFR calc Af Amer: 11 mL/min — ABNORMAL LOW (ref >60)
GFR, EST NON AFRICAN AMERICAN: 9 mL/min — AB (ref >60)
GLUCOSE: 121 mg/dL — AB (ref 65–99)
POTASSIUM: 4 mmol/L (ref 3.5–5.1)
Sodium: 137 mmol/L (ref 135–145)

## 2015-06-13 LAB — GLUCOSE, CAPILLARY
GLUCOSE-CAPILLARY: 104 mg/dL — AB (ref 65–99)
Glucose-Capillary: 106 mg/dL — ABNORMAL HIGH (ref 65–99)
Glucose-Capillary: 110 mg/dL — ABNORMAL HIGH (ref 65–99)
Glucose-Capillary: 126 mg/dL — ABNORMAL HIGH (ref 65–99)
Glucose-Capillary: 133 mg/dL — ABNORMAL HIGH (ref 65–99)
Glucose-Capillary: 142 mg/dL — ABNORMAL HIGH (ref 65–99)
Glucose-Capillary: 150 mg/dL — ABNORMAL HIGH (ref 65–99)

## 2015-06-13 LAB — RENAL FUNCTION PANEL
Albumin: 1.9 g/dL — ABNORMAL LOW (ref 3.5–5.0)
Anion gap: 9 (ref 5–15)
BUN: 79 mg/dL — AB (ref 6–20)
CHLORIDE: 101 mmol/L (ref 101–111)
CO2: 26 mmol/L (ref 22–32)
Calcium: 7.1 mg/dL — ABNORMAL LOW (ref 8.9–10.3)
Creatinine, Ser: 4.82 mg/dL — ABNORMAL HIGH (ref 0.44–1.00)
GFR calc Af Amer: 10 mL/min — ABNORMAL LOW (ref >60)
GFR calc non Af Amer: 8 mL/min — ABNORMAL LOW (ref >60)
GLUCOSE: 111 mg/dL — AB (ref 65–99)
POTASSIUM: 3.9 mmol/L (ref 3.5–5.1)
Phosphorus: 5.9 mg/dL — ABNORMAL HIGH (ref 2.5–4.6)
Sodium: 136 mmol/L (ref 135–145)

## 2015-06-13 LAB — CULTURE, RESPIRATORY

## 2015-06-13 LAB — LEGIONELLA PNEUMOPHILA SEROGP 1 UR AG: L. PNEUMOPHILA SEROGP 1 UR AG: NEGATIVE

## 2015-06-13 LAB — CULTURE, RESPIRATORY W GRAM STAIN

## 2015-06-13 LAB — PROTIME-INR
INR: 1.45
PROTHROMBIN TIME: 17.7 s — AB (ref 11.4–15.0)

## 2015-06-13 LAB — PHOSPHORUS: Phosphorus: 5.9 mg/dL — ABNORMAL HIGH (ref 2.5–4.6)

## 2015-06-13 MED ORDER — HEPARIN SODIUM (PORCINE) 5000 UNIT/ML IJ SOLN
5000.0000 [IU] | Freq: Two times a day (BID) | INTRAMUSCULAR | Status: DC
  Administered 2015-06-13: 10000 [IU] via SUBCUTANEOUS
  Administered 2015-06-13 – 2015-06-16 (×6): 5000 [IU] via SUBCUTANEOUS
  Filled 2015-06-13 (×5): qty 1
  Filled 2015-06-13: qty 4
  Filled 2015-06-13: qty 1

## 2015-06-13 MED ORDER — HEPARIN BOLUS VIA INFUSION
1500.0000 [IU] | Freq: Once | INTRAVENOUS | Status: AC
  Administered 2015-06-13: 1500 [IU] via INTRAVENOUS
  Filled 2015-06-13: qty 1500

## 2015-06-13 MED ORDER — HEPARIN SODIUM (PORCINE) 1000 UNIT/ML DIALYSIS
1000.0000 [IU] | INTRAMUSCULAR | Status: DC | PRN
  Filled 2015-06-13: qty 6

## 2015-06-13 MED ORDER — PUREFLOW DIALYSIS SOLUTION
INTRAVENOUS | Status: DC
  Administered 2015-06-13: 3 via INTRAVENOUS_CENTRAL
  Administered 2015-06-14: 1500 mL via INTRAVENOUS_CENTRAL
  Administered 2015-06-14 (×2): 3 via INTRAVENOUS_CENTRAL
  Administered 2015-06-15: 1000 mL via INTRAVENOUS_CENTRAL
  Administered 2015-06-15 – 2015-06-16 (×3): via INTRAVENOUS_CENTRAL
  Administered 2015-06-16 (×3): 3 via INTRAVENOUS_CENTRAL
  Administered 2015-06-17: 22:00:00 via INTRAVENOUS_CENTRAL
  Administered 2015-06-17: 3 via INTRAVENOUS_CENTRAL
  Administered 2015-06-17: 05:00:00 via INTRAVENOUS_CENTRAL
  Administered 2015-06-17: 3 via INTRAVENOUS_CENTRAL
  Administered 2015-06-18 (×2): via INTRAVENOUS_CENTRAL
  Administered 2015-06-18: 1500 mL via INTRAVENOUS_CENTRAL
  Administered 2015-06-19: 2500 mL via INTRAVENOUS_CENTRAL
  Administered 2015-06-19 (×2): via INTRAVENOUS_CENTRAL

## 2015-06-13 MED ORDER — DEXTROSE 5 % IV SOLN
500.0000 mg | Freq: Every day | INTRAVENOUS | Status: AC
  Administered 2015-06-13 – 2015-06-15 (×3): 500 mg via INTRAVENOUS
  Filled 2015-06-13 (×5): qty 500

## 2015-06-13 MED ORDER — VITAL HIGH PROTEIN PO LIQD
1000.0000 mL | ORAL | Status: DC
  Administered 2015-06-14: 1000 mL

## 2015-06-13 NOTE — Progress Notes (Addendum)
Patient ID: Lynn Butler, female   DOB: 1945-09-24, 69 y.o.   MRN: [id number] [facility] PROGRESS NOTE  PCP: Lynn Fairly, MD  HPI/Subjective: Patient on the ventilator. Patient opens eyes to stimulation. At this point unable to follow commands.  Objective: Filed Vitals:   06/13/15 0700  BP: 121/57  Pulse: 80  Temp: 98.4 F (36.9 C)  Resp: 15    Filed Weights   06/11/15 0215 06/12/15 0542 06/13/15 0425  Weight: 49.3 kg (108 lb 11 oz) 54.9 kg (121 lb 0.5 oz) 54.9 kg (121 lb 0.5 oz)    ROS: Review of Systems  Unable to perform ROS  patient on ventilator Exam: Physical Exam  Constitutional: She is intubated.  HENT:  Nose: No mucosal edema.  Unable to look and mouth  Eyes:  Left pupil slightly larger than right pupil.  Neck: Carotid bruit is not present. No thyromegaly present.  Cardiovascular:  Murmur heard.  Systolic murmur is present with a grade of 2/6  Pulses:      Dorsalis pedis pulses are 0 on the right side, and 0 on the left side.  Respiratory: She is intubated. She has decreased breath sounds in the right middle field, the right lower field and the left lower field. She has wheezes in the right lower field and the left lower field.  GI: Soft. There is no tenderness.  Musculoskeletal:       Right ankle: She exhibits no swelling.       Left ankle: She exhibits no swelling.  Neurological:  Sedated on the ventilator  Skin:  Patient had plastic surgery on her for head and face with skin grafts. Scab on the 4 head. Chronic wound on the left lower extremity.  Psychiatric:  Unable to assess on ventilator    Data Reviewed: Basic Metabolic Panel:  Recent Labs Lab 06/10/15 1930 06/11/15 0229 06/12/15 0330 06/13/15 0559  NA 137 139 139 137  K 5.4* 5.0 5.3* 4.0  CL 98* 104 109 103  CO2 24 27 21* 26  GLUCOSE 216* 260* 159* 121*  BUN 21* 29* 49* 73*  CREATININE 1.71* 1.63* 3.13* 4.42*  CALCIUM 8.1* 7.4* 7.2* 7.0*  MG  --  1.6*  --   --    PHOS  --  6.6*  --   --    Liver Function Tests:  Recent Labs Lab 06/10/15 1930 06/11/15 0229 06/12/15 0330  AST 1138* >2275* 2031*  ALT 643* 2215* 1958*  ALKPHOS 373* 379* 350*  BILITOT 1.2 1.4* 0.8  PROT 5.8* 5.2* 5.3*  ALBUMIN 2.6* 2.5* 2.4*    Recent Labs Lab 06/10/15 1930  LIPASE 46   CBC:  Recent Labs Lab 06/10/15 1930 06/11/15 0229 06/12/15 0330 06/13/15 0559  WBC 21.1* 26.4* 19.0* 27.4*  NEUTROABS 19.6*  --   --   --   HGB 10.2* 10.9* 10.6* 8.7*  HCT 35.3 36.7 35.5 28.9*  MCV 69.1* 68.5* 68.3* 66.2*  PLT 219 211 178 152   Cardiac Enzymes:  Recent Labs Lab 06/10/15 1930 06/11/15 0229 06/11/15 1351 06/11/15 2252 06/12/15 0540  TROPONINI 0.76* 8.37* 17.69* 15.54* 12.20*   BNP (last 3 results)  Recent Labs  01/20/15 2043 02/01/15 1432 04/07/15 1610  BNP 1333.0* 1345.0* 2338.0*     CBG:  Recent Labs Lab 06/12/15 1652 06/12/15 1943 06/13/15 0002 06/13/15 0423 06/13/15 0845  GLUCAP 138* 110* 142* 126* 110*    Recent Results (from the past 240 hour(s))  Urine culture  Status: None   Collection Time: 06/10/15  7:30 PM  Result Value Ref Range Status   Specimen Description URINE, RANDOM  Final   Special Requests NONE  Final   Culture NO GROWTH 2 DAYS  Final   Report Status 06/12/2015 FINAL  Final  Blood Culture (routine x 2)     Status: None (Preliminary result)   Collection Time: 06/10/15  8:10 PM  Result Value Ref Range Status   Specimen Description BLOOD RIGHT  Final   Special Requests BOTTLES DRAWN AEROBIC AND ANAEROBIC 5CC  Final   Culture NO GROWTH 3 DAYS  Final   Report Status PENDING  Incomplete  Blood Culture (routine x 2)     Status: None (Preliminary result)   Collection Time: 06/10/15  8:20 PM  Result Value Ref Range Status   Specimen Description BLOOD RIGHT  Final   Special Requests BOTTLES DRAWN AEROBIC AND ANAEROBIC 2CC AERO, 4CC  Final   Culture NO GROWTH 3 DAYS  Final   Report Status PENDING  Incomplete   MRSA PCR Screening     Status: Abnormal   Collection Time: 06/11/15  1:40 AM  Result Value Ref Range Status   MRSA by PCR POSITIVE (A) NEGATIVE Final    Comment:        The GeneXpert MRSA Assay (FDA approved for NASAL specimens only), is one component of a comprehensive MRSA colonization surveillance program. It is not intended to diagnose MRSA infection nor to guide or monitor treatment for MRSA infections. CRITICAL RESULT CALLED TO, READ BACK BY AND VERIFIED WITH: Lynn Butler AT [id number] 06/11/15.PMH   Culture, sputum-assessment     Status: None   Collection Time: 06/11/15  4:30 AM  Result Value Ref Range Status   Specimen Description TRACHEAL ASPIRATE  Final   Special Requests NONE  Final   Sputum evaluation THIS SPECIMEN IS ACCEPTABLE FOR SPUTUM CULTURE  Final   Report Status 06/11/2015 FINAL  Final  Culture, respiratory (NON-Expectorated)     Status: None (Preliminary result)   Collection Time: 06/11/15  4:30 AM  Result Value Ref Range Status   Specimen Description TRACHEAL ASPIRATE  Final   Special Requests NONE Reflexed from [id number]  Final   Gram Stain   Final    GOOD SPECIMEN - 80-90% WBCS MODERATE WBC SEEN MODERATE GRAM POSITIVE COCCI RARE GRAM NEGATIVE RODS    Culture HOLDING FOR POSSIBLE PATHOGEN  Final   Report Status PENDING  Incomplete     Studies: Dg Chest Port 1 View  06/13/2015  CLINICAL DATA:  Respiratory failure EXAM: PORTABLE CHEST 1 VIEW COMPARISON:  06/12/2015 FINDINGS: Endotracheal tube tip 15 mm above the carina. Central venous catheter tip in the left innominate vein unchanged. No pneumothorax. AICD unchanged. Mild bibasilar airspace disease shows mild progression and probably is atelectasis. No significant edema or effusion. IMPRESSION: Endotracheal tube 15 mm above the carina. Increased bibasilar atelectasis. Electronically Signed   By: Lynn Butler M.D.   On: 06/13/2015 07:45   Dg Chest Port 1 View  06/12/2015  CLINICAL DATA:  69 year old  female with respiratory failure. EXAM: PORTABLE CHEST 1 VIEW COMPARISON:  06/10/2015 and prior radiograph FINDINGS: Cardiomegaly and left AICD again noted. An endotracheal tube with tip 3.7 cm above the carina, left IJ central venous catheter with tip overlying the upper SVC and NG tube entering the stomach with tip off the field of view again noted. Probable small bilateral pleural effusions again noted. There has been little interval change since the  prior study. IMPRESSION: Unchanged appearance of the chest with cardiomegaly, probable small bilateral pleural effusions and support apparatus as described. Electronically Signed   By: Margarette Canada M.D.   On: 06/12/2015 08:01    Scheduled Meds: . antiseptic oral rinse  7 mL Mouth Rinse QID  . aspirin  81 mg Per Tube Daily  . budesonide  0.25 mg Nebulization BID  . chlorhexidine gluconate  15 mL Mouth Rinse BID  . Chlorhexidine Gluconate Cloth  6 each Topical Q0600  . feeding supplement (VITAL HIGH PROTEIN)  1,000 mL Per Tube Q24H  . hydrocortisone sod succinate (SOLU-CORTEF) inj  50 mg Intravenous Q8H  . insulin aspart  0-15 Units Subcutaneous 6 times per day  . ipratropium-albuterol  3 mL Nebulization Q6H  . mupirocin ointment  1 application Nasal BID  . pantoprazole (PROTONIX) IV  40 mg Intravenous Q24H  . phytonadione  5 mg Per Tube Daily  . piperacillin-tazobactam (ZOSYN)  IV  3.375 g Intravenous Q12H   Continuous Infusions: . fentaNYL infusion INTRAVENOUS 150 mcg/hr (06/12/15 2337)  . norepinephrine 4 mcg/min (06/13/15 0512)  .  sodium bicarbonate  infusion 1000 mL 75 mL/hr at 06/13/15 0300    Assessment/Plan:  1. Septic shock with multiorgan failure. Overall prognosis is poor. High mortality. Patient is a full code. Continue levophed. Stress dose steroids. Long discussion with daughter yesterday. 2. Sepsis with pneumonia right lung on first x-ray but X-ray did not comment on pneumonia. Patient is on Zosyn. 3. Acute respiratory failure  with hypoxia- continue full ventilatory support at this time, FiO2 30%. With history of end-stage COPD it may be difficult to get her off the ventilator. 4. Shock liver with coagulopathy- severely elevated LFTs. Supportive care 5. Acute myocardial infarction- likely secondary to septic shock. Continue aspirin. Heparin drip stopped. 6. Acute renal failure and oligoria- case discussed with nephrology, likely will start CRRT today. Patient on sodium bicarbonate drip. 7. Chronic pain on numerous outpatient medications- on fentanyl drip for sedation 8. Acute encephalopathy likely secondary to septic shock 9. History of skin cancer status post plastic surgery procedure. 10. Nutrition- tube feeding as per dietary  Code Status:     Code Status Orders        Start     Ordered   06/11/15 0142  Full code   Continuous     06/11/15 0141     Family Communication: Daughter at length yesterday. Disposition Plan: To be determined  Consultants:  Critical care specialist  Cardiology  Nephrology  Time spent: 33 minutes critical care time. Case discussed with nephrology and critical care specialist.  Loletha Grayer  [facility]

## 2015-06-13 NOTE — Progress Notes (Signed)
Nutrition Follow-up  DOCUMENTATION CODES:     RD notes severe malnutrition in the context of chronic illness on previous admission 2 weeks ago  INTERVENTION:   EN: Spoke with MD, Kasa regarding nutrition poc. MD agreeable to titrating up on EN. Will recommend a goal rate of 86m/hr of Vital High Protein to provide a total of 1080kcals and 95g protein with 9023mof free water. RD notes pt receiving 3010m4 hours of free water (providing an additional 180m40mily). Will monitor and make adjustments accordingly as pt now starting CRRT. Will recommend increasing to 35mL64mand goal rate after 12 hours if pt tolerating. RD notes Mg low on admission, P and K were elevated. Recommend rechecking Mg.   NUTRITION DIAGNOSIS:   Inadequate oral intake related to inability to eat as evidenced by NPO status.  GOAL:   Patient will meet greater than or equal to 90% of their needs  MONITOR:    (Energy Intake, Pulmonary Profile, Anthropometrics, Gastrointestinal Profile, Electrolyte and renal Profile)  REASON FOR ASSESSMENT:   Ventilator    ASSESSMENT:   Pt admitted after being found unresponsive by family. Pt currently sedated on the vent secondary to septic shock with acute on chronic respiratory failure. Pt with multi-organ failure, remains on the vent. Pt likely to begin CRRT today per MD in rounds.   Diet Order:   NPO   Current Nutrition: Pt tolerating Vital High Protein at 20mL/67mwith free water 30mL q56mours.     Scheduled Medications:  . antiseptic oral rinse  7 mL Mouth Rinse QID  . aspirin  81 mg Per Tube Daily  . budesonide  0.25 mg Nebulization BID  . chlorhexidine gluconate  15 mL Mouth Rinse BID  . Chlorhexidine Gluconate Cloth  6 each Topical Q0600  . [START ON 06/14/2015] feeding supplement (VITAL HIGH PROTEIN)  1,000 mL Per Tube Q24H  . heparin subcutaneous  5,000 Units Subcutaneous Q12H  . hydrocortisone sod succinate (SOLU-CORTEF) inj  50 mg Intravenous Q8H  .  insulin aspart  0-15 Units Subcutaneous 6 times per day  . ipratropium-albuterol  3 mL Nebulization Q6H  . mupirocin ointment  1 application Nasal BID  . pantoprazole (PROTONIX) IV  40 mg Intravenous Q24H  . piperacillin-tazobactam (ZOSYN)  IV  3.375 g Intravenous Q12H    Continuous Medications:  . fentaNYL infusion INTRAVENOUS 150 mcg/hr (06/12/15 2337)  . norepinephrine 4 mcg/min (06/13/15 0512)  .  sodium bicarbonate  infusion 1000 mL 75 mL/hr at 06/13/15 0300    Electrolyte/Renal Profile and Glucose Profile:   Recent Labs Lab 06/11/15 0229 06/12/15 0330 06/13/15 0559  NA 139 139 137  K 5.0 5.3* 4.0  CL 104 109 103  CO2 27 21* 26  BUN 29* 49* 73*  CREATININE 1.63* 3.13* 4.42*  CALCIUM 7.4* 7.2* 7.0*  MG 1.6*  --   --   PHOS 6.6*  --   --   GLUCOSE 260* 159* 121*   Protein Profile:  Recent Labs Lab 06/10/15 1930 06/11/15 0229 06/12/15 0330  ALBUMIN 2.6* 2.5* 2.4*   Hepatic Function Latest Ref Rng 06/12/2015 06/11/2015 06/10/2015  Total Protein 6.5 - 8.1 g/dL 5.3(L) 5.2(L) 5.8(L)  Albumin 3.5 - 5.0 g/dL 2.4(L) 2.5(L) 2.6(L)  AST 15 - 41 U/L 2031(H) >2275(H) 1138(H)  ALT 14 - 54 U/L 1958(H) 2215(H) 643(H)  Alk Phosphatase 38 - 126 U/L 350(H) 379(H) 373(H)  Total Bilirubin 0.3 - 1.2 mg/dL 0.8 1.4(H) 1.2    Nutritional Anemia Profile:  CBC Latest Ref Rng 06/13/2015 06/12/2015 06/11/2015  WBC 3.6 - 11.0 K/uL 27.4(H) 19.0(H) 26.4(H)  Hemoglobin 12.0 - 16.0 g/dL 8.7(L) 10.6(L) 10.9(L)  Hematocrit 35.0 - 47.0 % 28.9(L) 35.5 36.7  Platelets 150 - 440 K/uL 152 178 211    Gastrointestinal Profile: Last BM: unknown, hypoactive BS, soft abdomen per Nsg documentation   Nutrition-Focused Physical Exam Findings:  Unable to complete Nutrition-Focused physical exam at this time.     Weight Change:  Filed Weights   06/11/15 0215 06/12/15 0542 06/13/15 0425  Weight: 108 lb 11 oz (49.3 kg) 121 lb 0.5 oz (54.9 kg) 121 lb 0.5 oz (54.9 kg)     Skin:   stage I sacral  pressure ulcer   BMI:  Body mass index is 22.13 kg/(m^2).  Estimated Nutritional Needs:   Kcal:  1173kcals, (BEE: 1032, Ve: 6.4, TMax: 37.1) using current weight of 54.9kg  Protein:  74-98g protein (1.5-2.0g/kg)  Fluid:  1225-1418m of fluid (25-329mkg)  EDUCATION NEEDS:   Education needs no appropriate at this time  HI[place]unter[place]RD, LDN Pager ([phone]

## 2015-06-13 NOTE — Care Management Note (Signed)
Case Management Note  Patient Details  Name: Lynn Butler MRN: [id number] Date of Birth: Jan 09, 1946  Subjective/Objective:  Presents from home. She has chronic O2 @ 2L.  Recently at [facility] 10/22-10/28 with Community acquired PNA. Readmit 11/11 with septic shock, PNA and multi organ dysfunction. BUN/Creatinine rising, CRT planned. Vented. Palliative consulted. Prognosis poor.   Action/Plan:   Expected Discharge Date:                  Expected Discharge Plan:  [place]  In-House Referral:     Discharge planning Services  CM Consult  Post Acute Care Choice:    Choice offered to:     DME Arranged:    DME Agency:     HH Arranged:    [place] Agency:     Status of Service:  In process, will continue to follow  Medicare Important Message Given:    Date Medicare IM Given:    Medicare IM give by:    Date Additional Medicare IM Given:    Additional Medicare Important Message give by:     If discussed at [place] of Stay Meetings, dates discussed:    Additional Comments:  Jolly Mango, RN 06/13/2015, 1:13 PM

## 2015-06-13 NOTE — Progress Notes (Signed)
Continued heavy bleeding at access site. X-large clot at site, manual pressure held, 1Liter NS applied. Per Dr. Mortimer Fries, apply Femstop x 2 hours. Placed per protocol, deflate bulb at one hour to check pedal pulse, and reinflate. Dr. Mortimer Fries and Dr. Holley Raring aware of above.

## 2015-06-13 NOTE — Procedures (Signed)
Central Venous Dailysis Catheter Placement: Indication: Hemo Dialysis/CRRT   Consent: verbal/written Risks and benefits explained in detail including risk of infection, bleeding, respiratory failure and death..   Hand washing performed prior to starting the procedure.   Procedure: An active timeout was performed and correct patient, name, & ID confirmed.  After explaining risk and benefits, patient was positioned correctly for central venous access. Patient was prepped using strict sterile technique including chlorohexadine preps, sterile drape, sterile gown and sterile gloves.  The area was prepped, draped and anesthetized in the usual sterile manner. Patient comfort was obtained.  A triple lumen catheter was placed in RT femoral Vein There was good blood return, catheter caps were placed on lumens, catheter flushed easily, the line was secured and a sterile dressing and BIO-PATCH applied.   Ultrasound was used to visualize vasculature and guidance of needle.   Number of Attempts: 1 Complications:none  Estimated Blood Loss: none Chest Radiograph indicated and ordered.  Operator: Deveney Bayon.   Corrin Parker, M.D.  [facility]  Medical Director [place] Director [facility] Cardio-Pulmonary Department

## 2015-06-13 NOTE — Progress Notes (Signed)
PHARMACY - Consult for CRRT dosing and monitoring  Pharmacy Consult for CRRT dosing and monitoring     Allergies  Allergen Reactions  . Nsaids Nausea And Vomiting and Other (See Comments)    Reaction:  GI distress and burning     Patient Measurements: Height: 5\' 2"  (157.5 cm) Weight: 121 lb 0.5 oz (54.9 kg) IBW/kg (Calculated) : 50.1  Vital Signs: Temp: 98.6 F (37 C) (11/14 1000) Temp Source: Core (Comment) (11/14 0400) BP: 124/58 mmHg (11/14 1000) Pulse Rate: 87 (11/14 1000) Intake/Output from previous day: 11/13 0701 - 11/14 0700 In: 2601.6 [I.V.:2188.3; NG/GT:313.3; IV Piggyback:100] Out: 60 [Urine:60]   Vent settings for last 24 hours: Vent Mode:  [-] PRVC FiO2 (%):  [30 %] 30 % Set Rate:  [16 bmp] 16 bmp Vt Set:  [400 mL] 400 mL PEEP:  [5 cmH20] 5 cmH20  Labs:  Recent Labs  06/10/15 1930 06/11/15 0229 06/11/15 1351 06/12/15 0330 06/12/15 1101 06/13/15 0559  WBC 21.1* 26.4*  --  19.0*  --  27.4*  HGB 10.2* 10.9*  --  10.6*  --  8.7*  HCT 35.3 36.7  --  35.5  --  28.9*  PLT 219 211  --  178  --  152  APTT 41*  --  30  --  29  --   INR 2.26  --  1.93  --   --   --   CREATININE 1.71* 1.63*  --  3.13*  --  4.42*  MG  --  1.6*  --   --   --   --   PHOS  --  6.6*  --   --   --   --   ALBUMIN 2.6* 2.5*  --  2.4*  --   --   PROT 5.8* 5.2*  --  5.3*  --   --   AST 1138* >2275*  --  2031*  --   --   ALT 643* 2215*  --  1958*  --   --   ALKPHOS 373* 379*  --  350*  --   --   BILITOT 1.2 1.4*  --  0.8  --   --    Estimated Creatinine Clearance: 9.6 mL/min (by C-G formula based on Cr of 4.42).   Recent Labs  06/13/15 0423 06/13/15 0845 06/13/15 1136  GLUCAP 126* 110* 133*    Microbiology: Recent Results (from the past 720 hour(s))  Culture, blood (routine x 2)     Status: None   Collection Time: 05/21/15 10:49 PM  Result Value Ref Range Status   Specimen Description BLOOD RIGHT HAND  Final   Special Requests BOTTLES DRAWN AEROBIC AND ANAEROBIC 4CC   Final   Culture NO GROWTH 5 DAYS  Final   Report Status 05/26/2015 FINAL  Final  Culture, blood (routine x 2)     Status: None   Collection Time: 05/21/15 11:00 PM  Result Value Ref Range Status   Specimen Description BLOOD LEFT ASSIST CONTROL  Final   Special Requests BOTTLES DRAWN AEROBIC AND ANAEROBIC 4CC  Final   Culture NO GROWTH 5 DAYS  Final   Report Status 05/26/2015 FINAL  Final  MRSA PCR Screening     Status: None   Collection Time: 05/22/15  1:46 AM  Result Value Ref Range Status   MRSA by PCR NEGATIVE NEGATIVE Final    Comment:        The GeneXpert MRSA Assay (FDA approved for NASAL specimens only),  is one component of a comprehensive MRSA colonization surveillance program. It is not intended to diagnose MRSA infection nor to guide or monitor treatment for MRSA infections.   Urine culture     Status: None   Collection Time: 06/10/15  7:30 PM  Result Value Ref Range Status   Specimen Description URINE, RANDOM  Final   Special Requests NONE  Final   Culture NO GROWTH 2 DAYS  Final   Report Status 06/12/2015 FINAL  Final  Blood Culture (routine x 2)     Status: None (Preliminary result)   Collection Time: 06/10/15  8:10 PM  Result Value Ref Range Status   Specimen Description BLOOD RIGHT  Final   Special Requests BOTTLES DRAWN AEROBIC AND ANAEROBIC 5CC  Final   Culture NO GROWTH 3 DAYS  Final   Report Status PENDING  Incomplete  Blood Culture (routine x 2)     Status: None (Preliminary result)   Collection Time: 06/10/15  8:20 PM  Result Value Ref Range Status   Specimen Description BLOOD RIGHT  Final   Special Requests BOTTLES DRAWN AEROBIC AND ANAEROBIC 2CC AERO, 4CC  Final   Culture NO GROWTH 3 DAYS  Final   Report Status PENDING  Incomplete  MRSA PCR Screening     Status: Abnormal   Collection Time: 06/11/15  1:40 AM  Result Value Ref Range Status   MRSA by PCR POSITIVE (A) NEGATIVE Final    Comment:        The GeneXpert MRSA Assay (FDA approved for  NASAL specimens only), is one component of a comprehensive MRSA colonization surveillance program. It is not intended to diagnose MRSA infection nor to guide or monitor treatment for MRSA infections. CRITICAL RESULT CALLED TO, READ BACK BY AND VERIFIED WITH: [facility] ALLEN AT [id number] 06/11/15.PMH   Culture, sputum-assessment     Status: None   Collection Time: 06/11/15  4:30 AM  Result Value Ref Range Status   Specimen Description TRACHEAL ASPIRATE  Final   Special Requests NONE  Final   Sputum evaluation THIS SPECIMEN IS ACCEPTABLE FOR SPUTUM CULTURE  Final   Report Status 06/11/2015 FINAL  Final  Culture, respiratory (NON-Expectorated)     Status: None   Collection Time: 06/11/15  4:30 AM  Result Value Ref Range Status   Specimen Description TRACHEAL ASPIRATE  Final   Special Requests NONE Reflexed from [id number]  Final   Gram Stain   Final    GOOD SPECIMEN - 80-90% WBCS MODERATE WBC SEEN MODERATE GRAM POSITIVE COCCI RARE GRAM NEGATIVE RODS    Culture HEAVY GROWTH STREPTOCOCCUS PNEUMONIAE  Final   Report Status 06/13/2015 FINAL  Final   Organism ID, Bacteria STREPTOCOCCUS PNEUMONIAE  Final      Susceptibility   Streptococcus pneumoniae - MIC*    ERYTHROMYCIN <=0.12 SENSITIVE Sensitive     VANCOMYCIN 0.5 SENSITIVE Sensitive     TRIMETH/SULFA <=10 SENSITIVE Sensitive     CLINDAMYCIN <=0.25 SENSITIVE Sensitive     LEVOFLOXACIN Value in next row Sensitive      SENSITIVE1    LINEZOLID Value in next row Sensitive      SENSITIVE<=2    * HEAVY GROWTH STREPTOCOCCUS PNEUMONIAE    Assessment: All medications are appropriately dosed for CRRT at this time.   Plan:  Pharmacy will continue to monitor patient medications and make adjustments as need for appropriate CRRT dosing.   Nancy Fetter, PharmD Pharmacy Resident

## 2015-06-13 NOTE — Progress Notes (Signed)
MD order for CRRT, CVVHD. Dr. Mortimer Fries earlier placed left femoral trialysis catheter.  Patient was on heparin drip this a.m., discontinued at 09:00.  Heavy bleeding at femoral site, when initiate CRRT, venous and arterial pressures alarming high. Multiple attempts to run dialysate without success. Daughter at bedside, explain above. Spoke with Dr. Holley Raring and advised of above. Discussed patient status, liver failure, renal failure, sepsis. At this time will pack access ports with heparin, clean around access, and apply pressure, with hopes bleeding will subside.  Will initiate CRRT again approximately 20:00.

## 2015-06-13 NOTE — Progress Notes (Signed)
ANTIBIOTIC CONSULT NOTE - FOLLOW UP   Pharmacy Consult for vancomycin/Zosyn Indication: pneumonia  Allergies  Allergen Reactions  . Nsaids Nausea And Vomiting and Other (See Comments)    Reaction:  GI distress and burning     Patient Measurements: Height: 5\' 2"  (157.5 cm) Weight: 121 lb 0.5 oz (54.9 kg) IBW/kg (Calculated) : 50.1 Adjusted Body Weight:   Vital Signs: Temp: 98.6 F (37 C) (11/14 1000) Temp Source: Core (Comment) (11/14 0400) BP: 124/58 mmHg (11/14 1000) Pulse Rate: 87 (11/14 1000) Intake/Output from previous day: 11/13 0701 - 11/14 0700 In: 2601.6 [I.V.:2188.3; NG/GT:313.3; IV Piggyback:100] Out: 60 [Urine:60] Intake/Output from this shift:    Labs:  Recent Labs  06/11/15 0229 06/12/15 0330 06/13/15 0559  WBC 26.4* 19.0* 27.4*  HGB 10.9* 10.6* 8.7*  PLT 211 178 152  CREATININE 1.63* 3.13* 4.42*   Estimated Creatinine Clearance: 9.6 mL/min (by C-G formula based on Cr of 4.42). No results for input(s): VANCOTROUGH, VANCOPEAK, VANCORANDOM, GENTTROUGH, GENTPEAK, GENTRANDOM, TOBRATROUGH, TOBRAPEAK, TOBRARND, AMIKACINPEAK, AMIKACINTROU, AMIKACIN in the last 72 hours.   Microbiology: Recent Results (from the past 720 hour(s))  Culture, blood (routine x 2)     Status: None   Collection Time: 05/21/15 10:49 PM  Result Value Ref Range Status   Specimen Description BLOOD RIGHT HAND  Final   Special Requests BOTTLES DRAWN AEROBIC AND ANAEROBIC 4CC  Final   Culture NO GROWTH 5 DAYS  Final   Report Status 05/26/2015 FINAL  Final  Culture, blood (routine x 2)     Status: None   Collection Time: 05/21/15 11:00 PM  Result Value Ref Range Status   Specimen Description BLOOD LEFT ASSIST CONTROL  Final   Special Requests BOTTLES DRAWN AEROBIC AND ANAEROBIC 4CC  Final   Culture NO GROWTH 5 DAYS  Final   Report Status 05/26/2015 FINAL  Final  MRSA PCR Screening     Status: None   Collection Time: 05/22/15  1:46 AM  Result Value Ref Range Status   MRSA by PCR  NEGATIVE NEGATIVE Final    Comment:        The GeneXpert MRSA Assay (FDA approved for NASAL specimens only), is one component of a comprehensive MRSA colonization surveillance program. It is not intended to diagnose MRSA infection nor to guide or monitor treatment for MRSA infections.   Urine culture     Status: None   Collection Time: 06/10/15  7:30 PM  Result Value Ref Range Status   Specimen Description URINE, RANDOM  Final   Special Requests NONE  Final   Culture NO GROWTH 2 DAYS  Final   Report Status 06/12/2015 FINAL  Final  Blood Culture (routine x 2)     Status: None (Preliminary result)   Collection Time: 06/10/15  8:10 PM  Result Value Ref Range Status   Specimen Description BLOOD RIGHT  Final   Special Requests BOTTLES DRAWN AEROBIC AND ANAEROBIC 5CC  Final   Culture NO GROWTH 3 DAYS  Final   Report Status PENDING  Incomplete  Blood Culture (routine x 2)     Status: None (Preliminary result)   Collection Time: 06/10/15  8:20 PM  Result Value Ref Range Status   Specimen Description BLOOD RIGHT  Final   Special Requests BOTTLES DRAWN AEROBIC AND ANAEROBIC 2CC AERO, 4CC  Final   Culture NO GROWTH 3 DAYS  Final   Report Status PENDING  Incomplete  MRSA PCR Screening     Status: Abnormal   Collection Time:  06/11/15  1:40 AM  Result Value Ref Range Status   MRSA by PCR POSITIVE (A) NEGATIVE Final    Comment:        The GeneXpert MRSA Assay (FDA approved for NASAL specimens only), is one component of a comprehensive MRSA colonization surveillance program. It is not intended to diagnose MRSA infection nor to guide or monitor treatment for MRSA infections. CRITICAL RESULT CALLED TO, READ BACK BY AND VERIFIED WITH: [facility] ALLEN AT [id number] 06/11/15.PMH   Culture, sputum-assessment     Status: None   Collection Time: 06/11/15  4:30 AM  Result Value Ref Range Status   Specimen Description TRACHEAL ASPIRATE  Final   Special Requests NONE  Final   Sputum evaluation  THIS SPECIMEN IS ACCEPTABLE FOR SPUTUM CULTURE  Final   Report Status 06/11/2015 FINAL  Final  Culture, respiratory (NON-Expectorated)     Status: None (Preliminary result)   Collection Time: 06/11/15  4:30 AM  Result Value Ref Range Status   Specimen Description TRACHEAL ASPIRATE  Final   Special Requests NONE Reflexed from [id number]  Final   Gram Stain   Final    GOOD SPECIMEN - 80-90% WBCS MODERATE WBC SEEN MODERATE GRAM POSITIVE COCCI RARE GRAM NEGATIVE RODS    Culture HOLDING FOR POSSIBLE PATHOGEN  Final   Report Status PENDING  Incomplete    Medical History: Past Medical History  Diagnosis Date  . COPD (chronic obstructive pulmonary disease) ([place])   . Hypertension   . Ischemic cardiomyopathy     a.   . Coronary artery disease     a. s/p multiple stenting in 2005  . History of ventricular tachycardia   . PVD (peripheral vascular disease) ([place])   . PAD (peripheral artery disease) ([place])   . HLD (hyperlipidemia)   . Chronic back pain   . MI, old   . MRSA (methicillin resistant staph aureus) culture positive     left foot wound  . CHF (congestive heart failure) ([place])   . Pneumonia   . Asthma   . Cancer [facility])     Medications:  Infusions:  . fentaNYL infusion INTRAVENOUS 150 mcg/hr (06/12/15 2337)  . norepinephrine 4 mcg/min (06/13/15 0512)  .  sodium bicarbonate  infusion 1000 mL 75 mL/hr at 06/13/15 0300   Assessment: 69 yof cc CP with CXR showing right basilar airspace opacity concerning for PNA. Starting broad spectrum ABX, patient considered septic shock now.   Original: Vd 35.9 L, Ke 0.026 hr-1, T1/2 27 hr  11/13: Patient serum creatinine creatinine has increased significantly (AKI). Serum creatinine was 1.63 then  3.13 mg/dl.  11/14: Scr continues to increase, now at 4.42 Respiratory culture showing moderate GPC  And rare GNR. Patient remains afebrile.   Goal of Therapy:  Vancomycin trough level 15-20 mcg/ml  Plan:   Patient received last dose of  Vancomycin 500 mg @ ~2200 on 11/12. Based on significant changes in renal function, Vancomycin dose has been held and random level is scheduled for 11/14 @ 2030 (~48 hours post dose, 1/2 life of ~44 hours). Pharmacy to dose Vancomycin Upon evaluation of random vancomycin level on 11/14.  Will continue Zosyn at 3.375 g IV every 12 hours  Nancy Fetter, PharmD Pharmacy Resident

## 2015-06-13 NOTE — Progress Notes (Signed)
[facility]  ROUNDING NOTE   Subjective:  Remains oliguric at this point in time. Creatinine up to 4.4. Patient's daughter was to discuss goals of care with the rest of the family. Awaiting further instruction from family.  Objective:  Vital signs in last 24 hours:  Temp:  [97.3 F (36.3 C)-100 F (37.8 C)] 97.7 F (36.5 C) (11/14 0600) Pulse Rate:  [75-98] 80 (11/14 0600) Resp:  [15-26] 16 (11/14 0600) BP: (93-115)/(41-62) 115/62 mmHg (11/14 0600) SpO2:  [92 %-100 %] 96 % (11/14 0600) FiO2 (%):  [30 %] 30 % (11/14 0313) Weight:  [54.9 kg (121 lb 0.5 oz)] 54.9 kg (121 lb 0.5 oz) (11/14 0425)  Weight change: 0 kg (0 lb) Filed Weights   06/11/15 0215 06/12/15 0542 06/13/15 0425  Weight: 49.3 kg (108 lb 11 oz) 54.9 kg (121 lb 0.5 oz) 54.9 kg (121 lb 0.5 oz)    Intake/Output: I/O last 3 completed shifts: In: 4433.2 [I.V.:3539.8; NG/GT:343.3; IV Piggyback:550] Out: 80 [Urine:80]   Intake/Output this shift:     Physical Exam: General: Critically ill appearing  Head: Healing wound on forehead ETT in place  Eyes: Eyes closed  Neck: Supple, trachea midline  Lungs:  Bilateral rhonchi, vent assisted  Heart: S1S2 no rubs  Abdomen:  Soft, nontender, BS present   Extremities:  trace peripheral edema.  Neurologic: Not following commands, on the vent  Skin: Large forehead wound that is healing       Basic Metabolic Panel:  Recent Labs Lab 06/10/15 1930 06/11/15 0229 06/12/15 0330 06/13/15 0559  NA 137 139 139 137  K 5.4* 5.0 5.3* 4.0  CL 98* 104 109 103  CO2 24 27 21* 26  GLUCOSE 216* 260* 159* 121*  BUN 21* 29* 49* 73*  CREATININE 1.71* 1.63* 3.13* 4.42*  CALCIUM 8.1* 7.4* 7.2* 7.0*  MG  --  1.6*  --   --   PHOS  --  6.6*  --   --     Liver Function Tests:  Recent Labs Lab 06/10/15 1930 06/11/15 0229 06/12/15 0330  AST 1138* >2275* 2031*  ALT 643* 2215* 1958*  ALKPHOS 373* 379* 350*  BILITOT 1.2 1.4* 0.8  PROT 5.8* 5.2* 5.3*  ALBUMIN  2.6* 2.5* 2.4*    Recent Labs Lab 06/10/15 1930  LIPASE 46   No results for input(s): AMMONIA in the last 168 hours.  CBC:  Recent Labs Lab 06/10/15 1930 06/11/15 0229 06/12/15 0330 06/13/15 0559  WBC 21.1* 26.4* 19.0* 27.4*  NEUTROABS 19.6*  --   --   --   HGB 10.2* 10.9* 10.6* 8.7*  HCT 35.3 36.7 35.5 28.9*  MCV 69.1* 68.5* 68.3* 66.2*  PLT 219 211 178 152    Cardiac Enzymes:  Recent Labs Lab 06/10/15 1930 06/11/15 0229 06/11/15 1351 06/11/15 2252 06/12/15 0540  TROPONINI 0.76* 8.37* 17.69* 15.54* 12.20*    BNP: Invalid input(s): POCBNP  CBG:  Recent Labs Lab 06/12/15 1120 06/12/15 1652 06/12/15 1943 06/13/15 0002 06/13/15 0423  GLUCAP 106* 138* 110* 73* 126*    Microbiology: Results for orders placed or performed during the hospital encounter of 06/10/15  Urine culture     Status: None   Collection Time: 06/10/15  7:30 PM  Result Value Ref Range Status   Specimen Description URINE, RANDOM  Final   Special Requests NONE  Final   Culture NO GROWTH 2 DAYS  Final   Report Status 06/12/2015 FINAL  Final  Blood Culture (routine x 2)  Status: None (Preliminary result)   Collection Time: 06/10/15  8:10 PM  Result Value Ref Range Status   Specimen Description BLOOD RIGHT  Final   Special Requests BOTTLES DRAWN AEROBIC AND ANAEROBIC 5CC  Final   Culture NO GROWTH 2 DAYS  Final   Report Status PENDING  Incomplete  Blood Culture (routine x 2)     Status: None (Preliminary result)   Collection Time: 06/10/15  8:20 PM  Result Value Ref Range Status   Specimen Description BLOOD RIGHT  Final   Special Requests BOTTLES DRAWN AEROBIC AND ANAEROBIC 2CC AERO, 4CC  Final   Culture NO GROWTH 2 DAYS  Final   Report Status PENDING  Incomplete  MRSA PCR Screening     Status: Abnormal   Collection Time: 06/11/15  1:40 AM  Result Value Ref Range Status   MRSA by PCR POSITIVE (A) NEGATIVE Final    Comment:        The GeneXpert MRSA Assay (FDA approved for  NASAL specimens only), is one component of a comprehensive MRSA colonization surveillance program. It is not intended to diagnose MRSA infection nor to guide or monitor treatment for MRSA infections. CRITICAL RESULT CALLED TO, READ BACK BY AND VERIFIED WITH: [facility] ALLEN AT [id number] 06/11/15.PMH   Culture, sputum-assessment     Status: None   Collection Time: 06/11/15  4:30 AM  Result Value Ref Range Status   Specimen Description TRACHEAL ASPIRATE  Final   Special Requests NONE  Final   Sputum evaluation THIS SPECIMEN IS ACCEPTABLE FOR SPUTUM CULTURE  Final   Report Status 06/11/2015 FINAL  Final  Culture, respiratory (NON-Expectorated)     Status: None (Preliminary result)   Collection Time: 06/11/15  4:30 AM  Result Value Ref Range Status   Specimen Description TRACHEAL ASPIRATE  Final   Special Requests NONE Reflexed from [id number]  Final   Gram Stain   Final    GOOD SPECIMEN - 80-90% WBCS MODERATE WBC SEEN MODERATE GRAM POSITIVE COCCI RARE GRAM NEGATIVE RODS    Culture HOLDING FOR POSSIBLE PATHOGEN  Final   Report Status PENDING  Incomplete    Coagulation Studies:  Recent Labs  06/10/15 1930 06/11/15 1351  LABPROT 24.7* 22.0*  INR 2.26 1.93    Urinalysis:  Recent Labs  06/10/15 1930  [place] 1.018  PHURINE 5.0  GLUCOSEU NEGATIVE  HGBUR 1+*  [place] NEGATIVE  PROTEINUR 100*  NITRITE NEGATIVE  LEUKOCYTESUR 1+*      Imaging: Ct Head Wo Contrast  06/11/2015  CLINICAL DATA:  Found unresponsive, altered mental status, intubated EXAM: CT HEAD WITHOUT CONTRAST TECHNIQUE: Contiguous axial images were obtained from the base of the skull through the vertex without contrast. COMPARISON:  06/10/2015 FINDINGS: Stable mild brain atrophy pattern without acute intracranial hemorrhage, mass lesion, definite infarction, hydrocephalus, midline shift, herniation, or extra-axial fluid collection. No focal mass effect or edema.  Cisterns remain patent. Cerebellar atrophy as well. Atherosclerosis of the intracranial vessels of the skullbase. Mastoids remain clear. Minor scattered sinus mucosal thickening. Orbits are symmetric. IMPRESSION: Stable atrophy pattern. No interval change or acute process by noncontrast CT Electronically Signed   By: Jerilynn Mages.  Shick M.D.   On: 06/11/2015 09:20   Dg Chest Port 1 View  06/12/2015  CLINICAL DATA:  69 year old female with respiratory failure. EXAM: PORTABLE CHEST 1 VIEW COMPARISON:  06/10/2015 and prior radiograph FINDINGS: Cardiomegaly and left AICD again noted. An endotracheal tube with tip 3.7 cm above the carina,  left IJ central venous catheter with tip overlying the upper SVC and NG tube entering the stomach with tip off the field of view again noted. Probable small bilateral pleural effusions again noted. There has been little interval change since the prior study. IMPRESSION: Unchanged appearance of the chest with cardiomegaly, probable small bilateral pleural effusions and support apparatus as described. Electronically Signed   By: Margarette Canada M.D.   On: 06/12/2015 08:01     Medications:   . fentaNYL infusion INTRAVENOUS 150 mcg/hr (06/12/15 2337)  . heparin 1,100 Units/hr (06/13/15 0045)  . norepinephrine 4 mcg/min (06/13/15 0512)  .  sodium bicarbonate  infusion 1000 mL 75 mL/hr at 06/13/15 0300   . antiseptic oral rinse  7 mL Mouth Rinse QID  . aspirin  81 mg Per Tube Daily  . budesonide  0.25 mg Nebulization BID  . chlorhexidine gluconate  15 mL Mouth Rinse BID  . Chlorhexidine Gluconate Cloth  6 each Topical Q0600  . feeding supplement (VITAL HIGH PROTEIN)  1,000 mL Per Tube Q24H  . hydrocortisone sod succinate (SOLU-CORTEF) inj  50 mg Intravenous Q8H  . insulin aspart  0-15 Units Subcutaneous 6 times per day  . ipratropium-albuterol  3 mL Nebulization Q6H  . mupirocin ointment  1 application Nasal BID  . pantoprazole (PROTONIX) IV  40 mg Intravenous Q24H  . phytonadione   5 mg Per Tube Daily  . piperacillin-tazobactam (ZOSYN)  IV  3.375 g Intravenous Q12H   acetaminophen **OR** acetaminophen, polyvinyl alcohol, vancomycin  Assessment/ Plan:  69 y.o. female  with a PMHx of COPD, hypertension, ischemic cardiomyopathy with multiple coronary artery stents, history of ventricular tachycardia, peripheral vascular disease, hyperlipidemia, chronic back pain, history of congestive heart failure, who was admitted to [facility] on 06/10/2015 for evaluation of respiratory failure.   1. Acute renal failure secondary to acute tubular necrosis. Patient presented with multiorgan failure upon presentation. Baseline creatinine 0.75. - At this point in time patient remains critically ill. She also appears to have poor baseline functional status. Family still clarifying goals of care. Treatment team to discuss palliative care with the patient's family today. However as before if they opt for more aggressive course of action we could potentially consider continuous renal replacement therapy. However we will await family decision as overall the patient does appear to be a poor candidate for long-term dialysis.  2. Hyperkalemia. Potassium down to 4.0 today. Continue to monitor for now.  3. Acute respiratory failure. As above awaiting further discussion from the family regarding goals of care. In the meantime continue vent support  4. Acute myocardial infarction.  Last troponin was found to be 12.2 and was trending down.    LOS: 3 Ottis Sarnowski 11/14/20167:18 AM

## 2015-06-13 NOTE — Progress Notes (Signed)
Pharmacy Antibiotic Time-Out Note  Lynn Butler is a 69 y.o. year-old female admitted on 06/10/2015.  The patient is currently on Vancomycin and Zosyn  for Sepsis/CAP.  Assessment/Plan: Respiratory cultures show Strep Pneumoniae sensitive to Erythromycin and Clindamycin. After speaking with Dr. Mortimer Fries, can de-escalate therapy from Vanc and Zosyn to Azithromycin.    Recent Labs Lab 06/10/15 1930 06/11/15 0229 06/12/15 0330 06/13/15 0559  WBC 21.1* 26.4* 19.0* 27.4*    Recent Labs Lab 06/10/15 1930 06/11/15 0229 06/12/15 0330 06/13/15 0559  CREATININE 1.71* 1.63* 3.13* 4.42*   Estimated Creatinine Clearance: 9.6 mL/min (by C-G formula based on Cr of 4.42).    Microbiology Results: 11/11 BCx: NG X 3 days 11/11 UCx: NG x 3 days 11/12 Sputum: Strep Pneumoniae  11/12 MRSA PCR: Positive  Thank you for allowing pharmacy to be a part of this patient's care.  Pernell Dupre PharmD 06/13/2015 2:18 PM

## 2015-06-13 NOTE — Progress Notes (Signed)
PULMONARY / CRITICAL CARE MEDICINE   Name: Lynn Butler MRN: [id number] DOB: 29-Jul-1946    ADMISSION DATE:  06/10/2015 CONSULTATION DATE:  11/12  HPI:  69 yo F was admitted for altered MS with shock, resp failure.  Transported to Ed and ultimately intubated. Briefly hypotensive requiring vasopressors  MAJOR EVENTS/TEST RESULTS: 11/11 CTAP: NAD. Severe atherosclerosis 11/11 CT head: NAD 11/12 CT head: NAD 11/13 Worsening renal function. Renal consultation.  Deemed poor candidate for HD/CRRT of any duration. Goals of care discussed with pt's daughter 11/14-may start CRRT, vasc cath may be needed  INDWELLING DEVICES:: ETT 11/11 >>  L IJ CVL 11/11 >>  MICRO DATA: Urine 11/11 >> NEG Blood 11/11 >>  MRSA PCR 11/12 >> POS Resp 11/12 >>     ANTIMICROBIALS: Vanc 11/11 >>  Pip-tazo 11/11 >>    SUBJECTIVE:  Fentanyl gtt resumed 11/12 PM. RASS -3, -4. Not F/C. No distress Vent setting noted, on vasopressors, has metabolic acidosis,   VITAL SIGNS: Temp:  [97.3 F (36.3 C)-99.1 F (37.3 C)] 98.6 F (37 C) (11/14 1000) Pulse Rate:  [75-93] 87 (11/14 1000) Resp:  [15-32] 32 (11/14 1000) BP: (93-124)/(46-74) 124/58 mmHg (11/14 1000) SpO2:  [94 %-100 %] 96 % (11/14 1000) FiO2 (%):  [30 %] 30 % (11/14 1205) Weight:  [121 lb 0.5 oz (54.9 kg)] 121 lb 0.5 oz (54.9 kg) (11/14 0425) HEMODYNAMICS:   VENTILATOR SETTINGS: Vent Mode:  [-] PRVC FiO2 (%):  [30 %] 30 % Set Rate:  [16 bmp] 16 bmp Vt Set:  [400 mL] 400 mL PEEP:  [5 cmH20] 5 cmH20 INTAKE / OUTPUT:  Intake/Output Summary (Last 24 hours) at 06/13/15 1313 Last data filed at 06/13/15 0600  Gross per 24 hour  Intake 2590.28 ml  Output     60 ml  Net 2530.28 ml    PHYSICAL EXAMINATION: General: RASS -3, frail appearing, appears much older than chronological age Neuro: PERRL, minimal withdrawal from pain, DTRs symmetric HEENT: NCAT, forehead lesion Cardiovascular: reg, no M Lungs: no wheezes Abdomen: scaphoid,  diminished BS Ext: severe atrophy, no edema GCS 8T  LABS:  CBC  Recent Labs Lab 06/11/15 0229 06/12/15 0330 06/13/15 0559  WBC 26.4* 19.0* 27.4*  HGB 10.9* 10.6* 8.7*  HCT 36.7 35.5 28.9*  PLT 211 178 152   Coag's  Recent Labs Lab 06/10/15 1930 06/11/15 1351 06/12/15 1101  APTT 41* 30 29  INR 2.26 1.93  --    BMET  Recent Labs Lab 06/11/15 0229 06/12/15 0330 06/13/15 0559  NA 139 139 137  K 5.0 5.3* 4.0  CL 104 109 103  CO2 27 21* 26  BUN 29* 49* 73*  CREATININE 1.63* 3.13* 4.42*  GLUCOSE 260* 159* 121*   Electrolytes  Recent Labs Lab 06/11/15 0229 06/12/15 0330 06/13/15 0559  CALCIUM 7.4* 7.2* 7.0*  MG 1.6*  --   --   PHOS 6.6*  --   --    Sepsis Markers  Recent Labs Lab 06/10/15 1930 06/11/15 0229 06/12/15 0330  LATICACIDVEN 6.7* 1.8  --   PROCALCITON  --   --  32.06   ABG  Recent Labs Lab 06/10/15 2300 06/11/15 0600 06/13/15 1140  PHART 7.26* PENDING 7.29*  PCO2ART 53* 61* 49*  PO2ART 146* 141* 64*   Liver Enzymes  Recent Labs Lab 06/10/15 1930 06/11/15 0229 06/12/15 0330  AST 1138* >2275* 2031*  ALT 643* 2215* 1958*  ALKPHOS 373* 379* 350*  BILITOT 1.2 1.4* 0.8  ALBUMIN 2.6* 2.5*  2.4*   Cardiac Enzymes  Recent Labs Lab 06/11/15 1351 06/11/15 2252 06/12/15 0540  TROPONINI 17.69* 15.54* 12.20*   Glucose  Recent Labs Lab 06/12/15 1652 06/12/15 1943 06/13/15 0002 06/13/15 0423 06/13/15 0845 06/13/15 1136  GLUCAP 138* 110* 142* 126* 110* 133*    CXR: CM, no edema or infiltrates   ASSESSMENT / PLAN:  69 yo female with acute resp failure and acute multiorgan failure(resp,cardiovascualr,renal,liver) from acute pneumonia and cardiogenic/septic shock With COPD PULMONARY A: Acute resp failure Chronic COPD without acute bronchospasm P:   Cont full vent support - settings reviewed and/or adjusted Cont vent bundle  CARDIOVASCULAR A:  Acute MI-cardiogenic shock -vasopressors as needed CAD - 2VD  deemed to not be amenable to revascularization PVD P:  MAP goal > 65 mmHg Cards consultation: no role for cardiac intervention due to MODS, poor baseline  RENAL A:  AKI Mild metabolic acidosis Mild hyperkalemia Very poor candidate for HD of any duration-however, patient' daughter wants dialysis P:   Monitor BMET intermittently Monitor I/Os Correct electrolytes as indicated HCO3 gtt initiated 11/13 Renal consultation 11/13-plan for HD vasc cath placement today and assess for CRRT  GASTROINTESTINAL A:   No issues P:   SUP: IV PPI Continue TFs   HEMATOLOGIC A:   Mild anemia without overt bleeding P:  DVT px: SQ heparin Monitor CBC intermittently Transfuse per usual guidelines  INFECTIOUS A:   Possible severe sepsis, unclear source Elevated PCT (11/13 32.06) P:   Monitor temp, WBC count Micro and abx as above Cont PCT algorithm  ENDOCRINE A:   Hyperglycemia without documented DM H/O frequent steroid dosing - suspect adrenal insuff P:   Cont SSI Cont stress dose steroids  NEUROLOGIC A:   Severe acute encephalopathy Severe baseline debilitation P:   RASS goal: -1, -2 Cont to address goals of care.    I have personally obtained a history, examined the patient, evaluated Pertinent laboratory and RadioGraphic/imaging results, and  formulated the assessment and plan The Patient requires high complexity decision making for assessment and support, frequent evaluation and titration of therapies, application of advanced monitoring technologies and extensive interpretation of multiple databases. Critical Care Time devoted to patient care services described in this note is 40 minutes.   Overall, patient is critically ill, prognosis is guarded.  Patient with Multiorgan failure and at high risk for cardiac arrest and death. Will obtain palliative care consult   Corrin Parker, M.D.  [facility] Pulmonary & Critical Care Medicine  Medical Director [place] Director [place] Department       06/13/2015, 1:13 PM

## 2015-06-13 NOTE — Progress Notes (Signed)
Discussed case with pt's daugther in person, she's agreeable to a time limited trial of dialysis in the form of CRRT to see if her condition can potentially improve in the setting of multiorgan failure.  Case discussed with Dr. Mortimer Fries who will place temporary dialysis catheter and we will then proceed with CRRT.  No UF to be planned.

## 2015-06-13 NOTE — Progress Notes (Signed)
ANTICOAGULATION CONSULT NOTE - Follow UP  Pharmacy Consult for heparin gtt  Indication: chest pain/ACS  Allergies  Allergen Reactions  . Nsaids Nausea And Vomiting and Other (See Comments)    Reaction:  GI distress and burning     Patient Measurements: Height: 5\' 2"  (157.5 cm) Weight: 121 lb 0.5 oz (54.9 kg) IBW/kg (Calculated) : 50.1 Heparin Dosing Weight: 49.3 kg  Vital Signs: Temp: 97.9 F (36.6 C) (11/14 0000) Temp Source: Core (Comment) (11/13 2000) BP: 97/62 mmHg (11/14 0000) Pulse Rate: 81 (11/14 0000)  Labs:  Recent Labs  06/10/15 1930 06/11/15 0229 06/11/15 1351 06/11/15 2252 06/12/15 0330 06/12/15 0540 06/12/15 1101 06/12/15 2146  HGB 10.2* 10.9*  --   --  10.6*  --   --   --   HCT 35.3 36.7  --   --  35.5  --   --   --   PLT 219 211  --   --  178  --   --   --   APTT 41*  --  30  --   --   --  29  --   LABPROT 24.7*  --  22.0*  --   --   --   --   --   INR 2.26  --  1.93  --   --   --   --   --   HEPARINUNFRC  --   --   --  <0.10*  --   --  <0.10* <0.10*  CREATININE 1.71* 1.63*  --   --  3.13*  --   --   --   TROPONINI 0.76* 8.37* 17.69* 15.54*  --  12.20*  --   --     Estimated Creatinine Clearance: 13.6 mL/min (by C-G formula based on Cr of 3.13).   Medical History: Past Medical History  Diagnosis Date  . COPD (chronic obstructive pulmonary disease) ([place])   . Hypertension   . Ischemic cardiomyopathy     a.   . Coronary artery disease     a. s/p multiple stenting in 2005  . History of ventricular tachycardia   . PVD (peripheral vascular disease) ([place])   . PAD (peripheral artery disease) ([place])   . HLD (hyperlipidemia)   . Chronic back pain   . MI, old   . MRSA (methicillin resistant staph aureus) culture positive     left foot wound  . CHF (congestive heart failure) ([place])   . Pneumonia   . Asthma   . Cancer [facility])     Assessment: Pharmacy consulted to dose heparin gtt in this 69 year old female s/p respiratory arrest who was found  unresponsive by her daughter. Heparin infusion was started overnight but was stopped around 0830 this AM due to concern for bleeding. Patient had CT which was negative for hemorrhage. Troponin increased and now is at 17.69 mg/mL. MD would like heparin gtt restarted at this time.  Goal of Therapy:  Heparin level 0.3-0.7 units/ml Monitor platelets by anticoagulation protocol: Yes   Plan:  Give 2000 units bolus x 1 Start heparin infusion at 600 units/hr Check anti-Xa level in 8 hours and daily while on heparin Continue to monitor H&H and platelets   1112 2252 HL subtherapeutic, 750 unit IV x 1 bolus and increase rate to 700 units/hr. Will recheck level in 8 hours. . 11/13: Heparin level resulted @ <0.10 @ 11:01. Will give heparin bolus of 1500 units x 1 and will increase heparin gtt to  900 units/hr. Will order another heparin level to be drawn @ 20:30. Target goal of 0.3-0.7.   1113 2146 heparin level subtherapeutic. Confirmed with RN that infusion is running appropriately with no interruptions. 1500 units IV x 1 bolus and increase rate to 1100 units/hr. Will redraw level in 8 hours.  Laural Benes, Pharm.D.  Clinical Pharmacist 06/13/2015,12:36 AM

## 2015-06-14 DIAGNOSIS — L8990 Pressure ulcer of unspecified site, unspecified stage: Secondary | ICD-10-CM

## 2015-06-14 DIAGNOSIS — J189 Pneumonia, unspecified organism: Secondary | ICD-10-CM

## 2015-06-14 DIAGNOSIS — I5042 Chronic combined systolic (congestive) and diastolic (congestive) heart failure: Secondary | ICD-10-CM

## 2015-06-14 DIAGNOSIS — I2510 Atherosclerotic heart disease of native coronary artery without angina pectoris: Secondary | ICD-10-CM

## 2015-06-14 DIAGNOSIS — J449 Chronic obstructive pulmonary disease, unspecified: Secondary | ICD-10-CM

## 2015-06-14 LAB — RENAL FUNCTION PANEL
ALBUMIN: 1.9 g/dL — AB (ref 3.5–5.0)
ALBUMIN: 1.9 g/dL — AB (ref 3.5–5.0)
ANION GAP: 8 (ref 5–15)
Albumin: 2 g/dL — ABNORMAL LOW (ref 3.5–5.0)
Anion gap: 6 (ref 5–15)
Anion gap: 4 — ABNORMAL LOW (ref 5–15)
BUN: 63 mg/dL — ABNORMAL HIGH (ref 6–20)
BUN: 52 mg/dL — AB (ref 6–20)
BUN: 32 mg/dL — ABNORMAL HIGH (ref 6–20)
CALCIUM: 7.6 mg/dL — AB (ref 8.9–10.3)
CALCIUM: 7.8 mg/dL — AB (ref 8.9–10.3)
CHLORIDE: 102 mmol/L (ref 101–111)
CO2: 27 mmol/L (ref 22–32)
CO2: 28 mmol/L (ref 22–32)
CO2: 29 mmol/L (ref 22–32)
CREATININE: 1.98 mg/dL — AB (ref 0.44–1.00)
Calcium: 7.5 mg/dL — ABNORMAL LOW (ref 8.9–10.3)
Chloride: 102 mmol/L (ref 101–111)
Chloride: 102 mmol/L (ref 101–111)
Creatinine, Ser: 3.76 mg/dL — ABNORMAL HIGH (ref 0.44–1.00)
Creatinine, Ser: 3.07 mg/dL — ABNORMAL HIGH (ref 0.44–1.00)
GFR calc Af Amer: 17 mL/min — ABNORMAL LOW (ref >60)
GFR calc non Af Amer: 11 mL/min — ABNORMAL LOW (ref >60)
GFR calc non Af Amer: 25 mL/min — ABNORMAL LOW (ref >60)
GFR, EST AFRICAN AMERICAN: 13 mL/min — AB (ref >60)
GFR, EST AFRICAN AMERICAN: 29 mL/min — AB (ref >60)
GFR, EST NON AFRICAN AMERICAN: 15 mL/min — AB (ref >60)
GLUCOSE: 127 mg/dL — AB (ref 65–99)
Glucose, Bld: 130 mg/dL — ABNORMAL HIGH (ref 65–99)
Glucose, Bld: 142 mg/dL — ABNORMAL HIGH (ref 65–99)
PHOSPHORUS: 3.9 mg/dL (ref 2.5–4.6)
PHOSPHORUS: 1.9 mg/dL — AB (ref 2.5–4.6)
POTASSIUM: 4 mmol/L (ref 3.5–5.1)
Phosphorus: 4.6 mg/dL (ref 2.5–4.6)
Potassium: 3.9 mmol/L (ref 3.5–5.1)
Potassium: 3.9 mmol/L (ref 3.5–5.1)
SODIUM: 136 mmol/L (ref 135–145)
SODIUM: 135 mmol/L (ref 135–145)
Sodium: 137 mmol/L (ref 135–145)

## 2015-06-14 LAB — COMPREHENSIVE METABOLIC PANEL
ALT: 819 U/L — AB (ref 14–54)
AST: 280 U/L — AB (ref 15–41)
Albumin: 1.9 g/dL — ABNORMAL LOW (ref 3.5–5.0)
Alkaline Phosphatase: 187 U/L — ABNORMAL HIGH (ref 38–126)
Anion gap: 5 (ref 5–15)
BUN: 54 mg/dL — AB (ref 6–20)
CHLORIDE: 100 mmol/L — AB (ref 101–111)
CO2: 29 mmol/L (ref 22–32)
Calcium: 7.6 mg/dL — ABNORMAL LOW (ref 8.9–10.3)
Creatinine, Ser: 3.16 mg/dL — ABNORMAL HIGH (ref 0.44–1.00)
GFR calc Af Amer: 16 mL/min — ABNORMAL LOW (ref >60)
GFR, EST NON AFRICAN AMERICAN: 14 mL/min — AB (ref >60)
Glucose, Bld: 131 mg/dL — ABNORMAL HIGH (ref 65–99)
POTASSIUM: 3.9 mmol/L (ref 3.5–5.1)
SODIUM: 134 mmol/L — AB (ref 135–145)
Total Bilirubin: 0.5 mg/dL (ref 0.3–1.2)
Total Protein: 4.7 g/dL — ABNORMAL LOW (ref 6.5–8.1)

## 2015-06-14 LAB — GLUCOSE, CAPILLARY
GLUCOSE-CAPILLARY: 119 mg/dL — AB (ref 65–99)
GLUCOSE-CAPILLARY: 104 mg/dL — AB (ref 65–99)
GLUCOSE-CAPILLARY: 111 mg/dL — AB (ref 65–99)
GLUCOSE-CAPILLARY: 116 mg/dL — AB (ref 65–99)
Glucose-Capillary: 128 mg/dL — ABNORMAL HIGH (ref 65–99)

## 2015-06-14 LAB — BLOOD GAS, ARTERIAL
ALLENS TEST (PASS/FAIL): POSITIVE — AB
Acid-Base Excess: 4 mmol/L — ABNORMAL HIGH (ref 0.0–3.0)
Bicarbonate: 30.6 mEq/L — ABNORMAL HIGH (ref 21.0–28.0)
FIO2: 35
LHR: 16 {breaths}/min
O2 Saturation: 80.2 %
PEEP: 5 cmH2O
PO2 ART: 48 mmHg — AB (ref 83.0–108.0)
Patient temperature: 37
VT: 400 mL
pCO2 arterial: 58 mmHg — ABNORMAL HIGH (ref 32.0–48.0)
pH, Arterial: 7.33 — ABNORMAL LOW (ref 7.350–7.450)

## 2015-06-14 LAB — CBC
HEMATOCRIT: 25.3 % — AB (ref 35.0–47.0)
HEMOGLOBIN: 7.5 g/dL — AB (ref 12.0–16.0)
MCH: 19.5 pg — ABNORMAL LOW (ref 26.0–34.0)
MCHC: 29.7 g/dL — ABNORMAL LOW (ref 32.0–36.0)
MCV: 65.5 fL — AB (ref 80.0–100.0)
PLATELETS: 105 10*3/uL — AB (ref 150–440)
RBC: 3.86 MIL/uL (ref 3.80–5.20)
RDW: 22.8 % — AB (ref 11.5–14.5)
WBC: 26.7 10*3/uL — AB (ref 3.6–11.0)

## 2015-06-14 LAB — PROTIME-INR
INR: 1.28
PROTHROMBIN TIME: 16.1 s — AB (ref 11.4–15.0)

## 2015-06-14 LAB — MAGNESIUM
MAGNESIUM: 1.7 mg/dL (ref 1.7–2.4)
MAGNESIUM: 1.7 mg/dL (ref 1.7–2.4)
MAGNESIUM: 1.6 mg/dL — AB (ref 1.7–2.4)
Magnesium: 1.7 mg/dL (ref 1.7–2.4)
Magnesium: 1.6 mg/dL — ABNORMAL LOW (ref 1.7–2.4)
Magnesium: 2.3 mg/dL (ref 1.7–2.4)

## 2015-06-14 LAB — PHOSPHORUS: Phosphorus: 3.8 mg/dL (ref 2.5–4.6)

## 2015-06-14 MED ORDER — MAGNESIUM SULFATE 2 GM/50ML IV SOLN
2.0000 g | Freq: Once | INTRAVENOUS | Status: AC
  Administered 2015-06-14: 2 g via INTRAVENOUS
  Filled 2015-06-14: qty 50

## 2015-06-14 MED ORDER — SENNOSIDES-DOCUSATE SODIUM 8.6-50 MG PO TABS
1.0000 | ORAL_TABLET | Freq: Two times a day (BID) | ORAL | Status: DC
  Administered 2015-06-14 – 2015-06-18 (×6): 1 via ORAL
  Filled 2015-06-14 (×6): qty 1

## 2015-06-14 MED ORDER — DEXTROSE 5 % IV SOLN
1.0000 g | INTRAVENOUS | Status: DC
  Administered 2015-06-14: 1 g via INTRAVENOUS
  Filled 2015-06-14: qty 10

## 2015-06-14 MED ORDER — VITAL HIGH PROTEIN PO LIQD
1000.0000 mL | ORAL | Status: DC
  Administered 2015-06-16 – 2015-06-18 (×3): 1000 mL

## 2015-06-14 MED ORDER — FREE WATER
30.0000 mL | Status: DC
  Administered 2015-06-14 – 2015-06-19 (×29): 30 mL

## 2015-06-14 MED ORDER — HYDROCORTISONE NA SUCCINATE PF 100 MG IJ SOLR
50.0000 mg | Freq: Two times a day (BID) | INTRAMUSCULAR | Status: DC
  Administered 2015-06-14 – 2015-06-15 (×3): 50 mg via INTRAVENOUS
  Filled 2015-06-14 (×3): qty 2

## 2015-06-14 MED ORDER — CEFTRIAXONE SODIUM 2 G IJ SOLR
2.0000 g | Freq: Two times a day (BID) | INTRAMUSCULAR | Status: DC
  Administered 2015-06-14 – 2015-06-20 (×12): 2 g via INTRAVENOUS
  Filled 2015-06-14 (×13): qty 2

## 2015-06-14 NOTE — Progress Notes (Signed)
PULMONARY / CRITICAL CARE MEDICINE   Name: Lynn Butler MRN: [id number] DOB: 06-02-1946    ADMISSION DATE:  06/10/2015 CONSULTATION DATE:  11/12  HPI:  69 yo F was admitted for altered MS with shock, resp failure.  Transported to [facility] and ultimately intubated. Briefly hypotensive requiring vasopressors Started on CRRT yesterday, vasc cath with bleeding at insertion site-stopped with fem stop for several hours, remains critically ill, prognosis is poor  MAJOR EVENTS/TEST RESULTS: 11/11 CTAP: NAD. Severe atherosclerosis 11/11 CT head: NAD 11/12 CT head: NAD 11/13 Worsening renal function. Renal consultation.  Deemed poor candidate for HD/CRRT of any duration. Goals of care discussed with pt's daughter 11/14-may start CRRT, vasc cath may be needed  INDWELLING DEVICES:: ETT 11/11 >>  L IJ CVL 11/11 >> RT femoral vasc cath>>11/14  MICRO DATA: Urine 11/11 >> NEG Blood 11/11 >>  MRSA PCR 11/12 >> POS Resp 11/12 >>     ANTIMICROBIALS: Vanc 11/11 >>  Pip-tazo 11/11 >>    SUBJECTIVE:  Fentanyl gtt resumed 11/12 PM. RASS -3,  Not F/C. No distress Vent setting noted, on vasopressors, has metabolic acidosis, on crrt  VITAL SIGNS: Temp:  [95.9 F (35.5 C)-99 F (37.2 C)] 96.6 F (35.9 C) (11/15 0800) Pulse Rate:  [63-87] 66 (11/15 0800) Resp:  [15-32] 15 (11/15 0800) BP: (93-128)/(41-93) 93/47 mmHg (11/15 0800) SpO2:  [93 %-100 %] 99 % (11/15 0821) FiO2 (%):  [28 %-30 %] 28 % (11/15 0822) Weight:  [133 lb 9.6 oz (60.6 kg)] 133 lb 9.6 oz (60.6 kg) (11/15 0419) HEMODYNAMICS:   VENTILATOR SETTINGS: Vent Mode:  [-] PRVC FiO2 (%):  [28 %-30 %] 28 % Set Rate:  [16 bmp] 16 bmp Vt Set:  [400 mL] 400 mL PEEP:  [5 cmH20] 5 cmH20 INTAKE / OUTPUT:  Intake/Output Summary (Last 24 hours) at 06/14/15 0836 Last data filed at 06/14/15 0800  Gross per 24 hour  Intake 2966.83 ml  Output    150 ml  Net 2816.83 ml    PHYSICAL EXAMINATION: General: RASS -3, frail appearing,  appears much older than chronological age Neuro: PERRL, minimal withdrawal from pain, DTRs symmetric HEENT: NCAT, forehead lesion Cardiovascular: reg, no M Lungs: no wheezes Abdomen: scaphoid, diminished BS Ext: severe atrophy, no edema GCS 8T  LABS:  CBC  Recent Labs Lab 06/13/15 0559 06/13/15 2057 06/14/15 0543  WBC 27.4* 24.5* 26.7*  HGB 8.7* 7.4* 7.5*  HCT 28.9* 24.8* 25.3*  PLT 152 124* 105*   Coag's  Recent Labs Lab 06/10/15 1930 06/11/15 1351 06/12/15 1101 06/13/15 2057 06/14/15 0543  APTT 41* 30 29  --   --   INR 2.26 1.93  --  1.45 1.28   BMET  Recent Labs Lab 06/13/15 2052 06/14/15 0311 06/14/15 0543  NA 136 137 134*  K 3.9 4.0 3.9  CL 101 102 100*  CO2 26 27 29   BUN 79* 63* 54*  CREATININE 4.82* 3.76* 3.16*  GLUCOSE 111* 130* 131*   Electrolytes  Recent Labs Lab 06/13/15 2052 06/13/15 2057 06/14/15 0311 06/14/15 0543  CALCIUM 7.1*  --  7.5* 7.6*  MG  --  1.8 1.7 1.7  PHOS 5.9* 5.9* 4.6 3.8   Sepsis Markers  Recent Labs Lab 06/10/15 1930 06/11/15 0229 06/12/15 0330  LATICACIDVEN 6.7* 1.8  --   PROCALCITON  --   --  32.06   ABG  Recent Labs Lab 06/11/15 0600 06/13/15 1140 06/14/15 0825  PHART 7.10* 7.29* 7.33*  PCO2ART 61* 49* 58*  PO2ART 141* 64* 48*   Liver Enzymes  Recent Labs Lab 06/11/15 0229 06/12/15 0330 06/13/15 2052 06/14/15 0311 06/14/15 0543  AST >2275* 2031*  --   --  280*  ALT 2215* 1958*  --   --  819*  ALKPHOS 379* 350*  --   --  187*  BILITOT 1.4* 0.8  --   --  0.5  ALBUMIN 2.5* 2.4* 1.9* 2.0* 1.9*   Cardiac Enzymes  Recent Labs Lab 06/11/15 1351 06/11/15 2252 06/12/15 0540  TROPONINI 17.69* 15.54* 12.20*   Glucose  Recent Labs Lab 06/13/15 1136 06/13/15 1625 06/13/15 1929 06/13/15 2328 06/14/15 0351 06/14/15 0738  GLUCAP 133* 150* 104* 106* 128* 119*    CXR: CM, no edema or infiltrates   ASSESSMENT / PLAN:  69 yo female with acute resp failure and acute multiorgan  failure(resp,cardiovascualr,renal,liver) from acute strep  pneumonia and cardiogenic/septic shock With COPD  PULMONARY A: Acute resp failure Chronic COPD without acute bronchospasm P:   Cont full vent support - settings reviewed and/or adjusted Cont vent bundle -follow up ABG and CXR as needed  CARDIOVASCULAR A:  Acute MI-cardiogenic shock -vasopressors as needed CAD - 2VD deemed to not be amenable to revascularization PVD P:  MAP goal > 65 mmHg Cards consultation: no role for cardiac intervention due to MODS, poor baseline -follow up cardiology recs  RENAL A:  AKI Mild metabolic acidosis Mild hyperkalemia Very poor candidate for HD of any duration-however, patient' daughter wants dialysis P:   Monitor BMET intermittently Monitor I/Os Correct electrolytes as indicated HCO3 gtt initiated 11/13 Renal consultation 11/13-s/p HD vasc cath-started on CRRT   GASTROINTESTINAL A:   No issues P:   SUP: IV PPI Continue TFs   HEMATOLOGIC A:   Mild anemia without overt bleeding P:  DVT px: SQ heparin Monitor CBC intermittently Transfuse per usual guidelines  INFECTIOUS Strep pneumonia P:   Monitor temp, WBC count   ENDOCRINE A:   Hyperglycemia without documented DM H/O frequent steroid dosing - suspect adrenal insuff P:   Cont SSI Cont stress dose steroids  NEUROLOGIC A:   Severe acute encephalopathy Severe baseline debilitation P:   RASS goal: -1, -2 Cont to address goals of care.    I have personally obtained a history, examined the patient, evaluated Pertinent laboratory and [employer]/imaging results, and  formulated the assessment and plan The Patient requires high complexity decision making for assessment and support, frequent evaluation and titration of therapies, application of advanced monitoring technologies and extensive interpretation of multiple databases. Critical Care Time devoted to patient care services described in this note is 40  minutes.   Overall, patient is critically ill, prognosis is guarded.  Patient with Multiorgan failure and at high risk for cardiac arrest and death. Will obtain palliative care consult   Corrin Parker, M.D.  [facility] Pulmonary & Critical Care Medicine  Medical Director [place] Director [place] Department       06/14/2015, 8:36 AM

## 2015-06-14 NOTE — Progress Notes (Signed)
Patient ID: Marki Brenchley, female   DOB: [birth year], 69 y.o.   MRN: [id number] [facility] PROGRESS NOTE  PCP: Juanell Fairly, MD  HPI/Subjective: Patient intubated and sedated. Now on warming blanket for hypothermia. Patient's eyes are open and she does move her arms. Patient had some bleeding from the dialysis catheter site.  Objective: Filed Vitals:   06/14/15 1000  BP: 138/64  Pulse: 74  Temp: 97.3 F (36.3 C)  Resp: 26    Filed Weights   06/12/15 0542 06/13/15 0425 06/14/15 0419  Weight: 54.9 kg (121 lb 0.5 oz) 54.9 kg (121 lb 0.5 oz) 60.6 kg (133 lb 9.6 oz)    ROS: Review of Systems  Unable to perform ROS  patient on ventilator Exam: Physical Exam  Constitutional: She is intubated.  HENT:  Nose: No mucosal edema.  Unable to look and mouth  Eyes:  Left pupil slightly larger than right pupil.  Neck: Carotid bruit is not present. No thyromegaly present.  Cardiovascular:  Murmur heard.  Systolic murmur is present with a grade of 2/6  Pulses:      Dorsalis pedis pulses are 0 on the right side, and 0 on the left side.  Respiratory: She is intubated. She has decreased breath sounds in the right middle field, the right lower field and the left lower field. She has wheezes in the right lower field and the left lower field.  GI: Soft. There is no tenderness.  Musculoskeletal:       Right ankle: She exhibits no swelling.       Left ankle: She exhibits no swelling.  Neurological:  Patient moving her right arm on her own. She wiggled her toes bilaterally  Skin:  Patient had plastic surgery on her for head and face with skin grafts. Scab on the 4 head. Chronic wound on the left lower extremity.  Psychiatric:  Unable to assess secondary to patient being on the ventilator    Data Reviewed: Basic Metabolic Panel:  Recent Labs Lab 06/11/15 0229  06/13/15 0559 06/13/15 2052 06/13/15 2057 06/14/15 0311 06/14/15 0543 06/14/15 0949  NA 139  < > 137 136   --  137 134* 136  K 5.0  < > 4.0 3.9  --  4.0 3.9 3.9  CL 104  < > 103 101  --  102 100* 102  CO2 27  < > 26 26  --  27 29 28   GLUCOSE 260*  < > 121* 111*  --  130* 131* 127*  BUN 29*  < > 73* 79*  --  63* 54* 52*  CREATININE 1.63*  < > 4.42* 4.82*  --  3.76* 3.16* 3.07*  CALCIUM 7.4*  < > 7.0* 7.1*  --  7.5* 7.6* 7.6*  MG 1.6*  --   --   --  1.8 1.7 1.7 1.7  PHOS 6.6*  --   --  5.9* 5.9* 4.6 3.8 3.9  < > = values in this interval not displayed. Liver Function Tests:  Recent Labs Lab 06/10/15 1930 06/11/15 0229 06/12/15 0330 06/13/15 2052 06/14/15 0311 06/14/15 0543 06/14/15 0949  AST 1138* >2275* 2031*  --   --  280*  --   ALT 643* 2215* 1958*  --   --  819*  --   ALKPHOS 373* 379* 350*  --   --  187*  --   BILITOT 1.2 1.4* 0.8  --   --  0.5  --   PROT 5.8* 5.2* 5.3*  --   --  4.7*  --   ALBUMIN 2.6* 2.5* 2.4* 1.9* 2.0* 1.9* 1.9*    Recent Labs Lab 06/10/15 1930  LIPASE 46   CBC:  Recent Labs Lab 06/10/15 1930 06/11/15 0229 06/12/15 0330 06/13/15 0559 06/13/15 2057 06/14/15 0543  WBC 21.1* 26.4* 19.0* 27.4* 24.5* 26.7*  NEUTROABS 19.6*  --   --   --  22.7*  --   HGB 10.2* 10.9* 10.6* 8.7* 7.4* 7.5*  HCT 35.3 36.7 35.5 28.9* 24.8* 25.3*  MCV 69.1* 68.5* 68.3* 66.2* 65.3* 65.5*  PLT 219 211 178 152 124* 105*   Cardiac Enzymes:  Recent Labs Lab 06/10/15 1930 06/11/15 0229 06/11/15 1351 06/11/15 2252 06/12/15 0540  TROPONINI 0.76* 8.37* 17.69* 15.54* 12.20*   BNP (last 3 results)  Recent Labs  01/20/15 2043 02/01/15 1432 04/07/15 1610  BNP 1333.0* 1345.0* 2338.0*     CBG:  Recent Labs Lab 06/13/15 1929 06/13/15 2328 06/14/15 0351 06/14/15 0738 06/14/15 1134  GLUCAP 104* 106* 128* 119* 104*    Recent Results (from the past 240 hour(s))  Urine culture     Status: None   Collection Time: 06/10/15  7:30 PM  Result Value Ref Range Status   Specimen Description URINE, RANDOM  Final   Special Requests NONE  Final   Culture NO GROWTH 2  DAYS  Final   Report Status 06/12/2015 FINAL  Final  Blood Culture (routine x 2)     Status: None (Preliminary result)   Collection Time: 06/10/15  8:10 PM  Result Value Ref Range Status   Specimen Description BLOOD RIGHT  Final   Special Requests BOTTLES DRAWN AEROBIC AND ANAEROBIC 5CC  Final   Culture NO GROWTH 4 DAYS  Final   Report Status PENDING  Incomplete  Blood Culture (routine x 2)     Status: None (Preliminary result)   Collection Time: 06/10/15  8:20 PM  Result Value Ref Range Status   Specimen Description BLOOD RIGHT  Final   Special Requests BOTTLES DRAWN AEROBIC AND ANAEROBIC 2CC AERO, 4CC  Final   Culture NO GROWTH 4 DAYS  Final   Report Status PENDING  Incomplete  MRSA PCR Screening     Status: Abnormal   Collection Time: 06/11/15  1:40 AM  Result Value Ref Range Status   MRSA by PCR POSITIVE (A) NEGATIVE Final    Comment:        The GeneXpert MRSA Assay (FDA approved for NASAL specimens only), is one component of a comprehensive MRSA colonization surveillance program. It is not intended to diagnose MRSA infection nor to guide or monitor treatment for MRSA infections. CRITICAL RESULT CALLED TO, READ BACK BY AND VERIFIED WITH: [facility] ALLEN AT [id number] 06/11/15.PMH   Culture, sputum-assessment     Status: None   Collection Time: 06/11/15  4:30 AM  Result Value Ref Range Status   Specimen Description TRACHEAL ASPIRATE  Final   Special Requests NONE  Final   Sputum evaluation THIS SPECIMEN IS ACCEPTABLE FOR SPUTUM CULTURE  Final   Report Status 06/11/2015 FINAL  Final  Culture, respiratory (NON-Expectorated)     Status: None   Collection Time: 06/11/15  4:30 AM  Result Value Ref Range Status   Specimen Description TRACHEAL ASPIRATE  Final   Special Requests NONE Reflexed from [id number]  Final   Gram Stain   Final    GOOD SPECIMEN - 80-90% WBCS MODERATE WBC SEEN MODERATE GRAM POSITIVE COCCI RARE GRAM NEGATIVE RODS    Culture HEAVY GROWTH STREPTOCOCCUS  PNEUMONIAE  Final   Report Status 06/13/2015 FINAL  Final   Organism ID, Bacteria STREPTOCOCCUS PNEUMONIAE  Final      Susceptibility   Streptococcus pneumoniae - MIC*    ERYTHROMYCIN <=0.12 SENSITIVE Sensitive     VANCOMYCIN 0.5 SENSITIVE Sensitive     TRIMETH/SULFA <=10 SENSITIVE Sensitive     CLINDAMYCIN <=0.25 SENSITIVE Sensitive     LEVOFLOXACIN Value in next row Sensitive      SENSITIVE1    LINEZOLID Value in next row Sensitive      SENSITIVE<=2    * HEAVY GROWTH STREPTOCOCCUS PNEUMONIAE     Studies: Dg Chest Port 1 View  06/13/2015  CLINICAL DATA:  Respiratory failure EXAM: PORTABLE CHEST 1 VIEW COMPARISON:  06/12/2015 FINDINGS: Endotracheal tube tip 15 mm above the carina. Central venous catheter tip in the left innominate vein unchanged. No pneumothorax. AICD unchanged. Mild bibasilar airspace disease shows mild progression and probably is atelectasis. No significant edema or effusion. IMPRESSION: Endotracheal tube 15 mm above the carina. Increased bibasilar atelectasis. Electronically Signed   By: Franchot Gallo M.D.   On: 06/13/2015 07:45    Scheduled Meds: . antiseptic oral rinse  7 mL Mouth Rinse QID  . aspirin  81 mg Per Tube Daily  . azithromycin  500 mg Intravenous Daily  . budesonide  0.25 mg Nebulization BID  . chlorhexidine gluconate  15 mL Mouth Rinse BID  . Chlorhexidine Gluconate Cloth  6 each Topical Q0600  . feeding supplement (VITAL HIGH PROTEIN)  1,000 mL Per Tube Q24H  . heparin subcutaneous  5,000 Units Subcutaneous Q12H  . hydrocortisone sod succinate (SOLU-CORTEF) inj  50 mg Intravenous Q12H  . insulin aspart  0-15 Units Subcutaneous 6 times per day  . ipratropium-albuterol  3 mL Nebulization Q6H  . mupirocin ointment  1 application Nasal BID  . pantoprazole (PROTONIX) IV  40 mg Intravenous Q24H  . senna-docusate  1 tablet Oral BID   Continuous Infusions: . fentaNYL infusion INTRAVENOUS 200 mcg/hr (06/14/15 1136)  . norepinephrine 2 mcg/min  (06/13/15 2330)  . pureflow 3 each (06/14/15 1137)    Assessment/Plan:  1. Septic shock with multiorgan failure. Overall prognosis is poor. High mortality. Patient is a full code. Nursing staff just weaned off levophed. Taper her Solu-Cortef to 50 mg IV every 12 hours. Long discussion with daughter. 2. Sepsis with pneumonia right lung on first x-ray. Sputum culture grew out Streptococcus pneumoniae. Patient initially placed on triple antibiotics. I can fine-tune antibiotics to Rocephin at this point. 3. Acute respiratory failure with hypoxia- continue full ventilatory support at this time, FiO2 28%. With history of end-stage COPD it may be difficult to get her off the ventilator. 4. Shock liver with coagulopathy- severely elevated LFTs. Supportive care 5. Acute myocardial infarction- likely secondary to septic shock. Continue aspirin. 6. Acute renal failure and oligoria- case discussed with nephrology, continue CRRT 7. Chronic pain on numerous outpatient medications- on fentanyl drip for sedation 8. Acute encephalopathy likely secondary to septic shock 9. History of skin cancer status post plastic surgery procedure. 10. Nutrition- tube feeding as per dietary 11. Anemia likely of chronic disease and also dilutional from all the IV fluids that were given here. Patient may end up needing a blood transfusion during the hospital course. 12. Hypothermia warming blanket  Code Status:     Code Status Orders        Start     Ordered   06/11/15 0142  Full code   Continuous  06/11/15 0141     Family Communication: Daughter at length yesterday. Disposition Plan: To be determined  Consultants:  Critical care specialist  Cardiology  Nephrology  Time spent: 32 minutes critical care time. Case discussed with nephrology and critical care specialist.  Loletha Grayer  [facility] Hospitalists

## 2015-06-14 NOTE — Progress Notes (Signed)
PHARMACY - Consult for CRRT dosing and monitoring  Pharmacy Consult for CRRT dosing and monitoring     Allergies  Allergen Reactions  . Nsaids Nausea And Vomiting and Other (See Comments)    Reaction:  GI distress and burning     Patient Measurements: Height: 5\' 2"  (157.5 cm) Weight: 133 lb 9.6 oz (60.6 kg) IBW/kg (Calculated) : 50.1  Vital Signs: Temp: 98.2 F (36.8 C) (11/15 1500) BP: 98/55 mmHg (11/15 1500) Pulse Rate: 83 (11/15 1500) Intake/Output from previous day: 11/14 0701 - 11/15 0700 In: 2966.8 [I.V.:2716.8; IV Piggyback:250] Out: 150 [Urine:150] Total I/O In: 1234.6 [I.V.:262.3; NG/GT:672.3; IV Piggyback:300] Out: 0  Vent settings for last 24 hours: Vent Mode:  [-] PRVC FiO2 (%):  [28 %-30 %] 28 % Set Rate:  [16 bmp-24 bmp] 24 bmp Vt Set:  [400 mL] 400 mL PEEP:  [5 cmH20] 5 cmH20  Labs:  Recent Labs  06/12/15 0330 06/12/15 1101 06/13/15 0559  06/13/15 2057 06/14/15 0311 06/14/15 0543 06/14/15 0949  WBC 19.0*  --  27.4*  --  24.5*  --  26.7*  --   HGB 10.6*  --  8.7*  --  7.4*  --  7.5*  --   HCT 35.5  --  28.9*  --  24.8*  --  25.3*  --   PLT 178  --  152  --  124*  --  105*  --   APTT  --  29  --   --   --   --   --   --   INR  --   --   --   --  1.45  --  1.28  --   CREATININE 3.13*  --  4.42*  < >  --  3.76* 3.16* 3.07*  MG  --   --   --   < > 1.8 1.7 1.7 1.7  PHOS  --   --   --   < > 5.9* 4.6 3.8 3.9  ALBUMIN 2.4*  --   --   < >  --  2.0* 1.9* 1.9*  PROT 5.3*  --   --   --   --   --  4.7*  --   AST 2031*  --   --   --   --   --  280*  --   ALT 1958*  --   --   --   --   --  819*  --   ALKPHOS 350*  --   --   --   --   --  187*  --   BILITOT 0.8  --   --   --   --   --  0.5  --   < > = values in this interval not displayed. Estimated Creatinine Clearance: 15 mL/min (by C-G formula based on Cr of 3.07).   Recent Labs  06/14/15 0351 06/14/15 0738 06/14/15 1134  GLUCAP 128* 119* 104*    Microbiology: Recent Results (from the past  720 hour(s))  Culture, blood (routine x 2)     Status: None   Collection Time: 05/21/15 10:49 PM  Result Value Ref Range Status   Specimen Description BLOOD RIGHT HAND  Final   Special Requests BOTTLES DRAWN AEROBIC AND ANAEROBIC 4CC  Final   Culture NO GROWTH 5 DAYS  Final   Report Status 05/26/2015 FINAL  Final  Culture, blood (routine x 2)     Status: None  Collection Time: 05/21/15 11:00 PM  Result Value Ref Range Status   Specimen Description BLOOD LEFT ASSIST CONTROL  Final   Special Requests BOTTLES DRAWN AEROBIC AND ANAEROBIC 4CC  Final   Culture NO GROWTH 5 DAYS  Final   Report Status 05/26/2015 FINAL  Final  MRSA PCR Screening     Status: None   Collection Time: 05/22/15  1:46 AM  Result Value Ref Range Status   MRSA by PCR NEGATIVE NEGATIVE Final    Comment:        The GeneXpert MRSA Assay (FDA approved for NASAL specimens only), is one component of a comprehensive MRSA colonization surveillance program. It is not intended to diagnose MRSA infection nor to guide or monitor treatment for MRSA infections.   Urine culture     Status: None   Collection Time: 06/10/15  7:30 PM  Result Value Ref Range Status   Specimen Description URINE, RANDOM  Final   Special Requests NONE  Final   Culture NO GROWTH 2 DAYS  Final   Report Status 06/12/2015 FINAL  Final  Blood Culture (routine x 2)     Status: None (Preliminary result)   Collection Time: 06/10/15  8:10 PM  Result Value Ref Range Status   Specimen Description BLOOD RIGHT  Final   Special Requests BOTTLES DRAWN AEROBIC AND ANAEROBIC 5CC  Final   Culture  Setup Time GRAM POSITIVE COCCI  Final   Culture   Final    AEROBIC BOTTLE ONLY GRAM POSITIVE COCCI CRITICAL RESULT CALLED TO, READ BACK BY AND VERIFIED WITH: RN Kathi Simpers 06/14/15 1358    Report Status PENDING  Incomplete  Blood Culture (routine x 2)     Status: None (Preliminary result)   Collection Time: 06/10/15  8:20 PM  Result Value Ref Range  Status   Specimen Description BLOOD RIGHT  Final   Special Requests BOTTLES DRAWN AEROBIC AND ANAEROBIC 2CC AERO, 4CC  Final   Culture NO GROWTH 4 DAYS  Final   Report Status PENDING  Incomplete  MRSA PCR Screening     Status: Abnormal   Collection Time: 06/11/15  1:40 AM  Result Value Ref Range Status   MRSA by PCR POSITIVE (A) NEGATIVE Final    Comment:        The GeneXpert MRSA Assay (FDA approved for NASAL specimens only), is one component of a comprehensive MRSA colonization surveillance program. It is not intended to diagnose MRSA infection nor to guide or monitor treatment for MRSA infections. CRITICAL RESULT CALLED TO, READ BACK BY AND VERIFIED WITH: [facility] ALLEN AT [id number] 06/11/15.PMH   Culture, sputum-assessment     Status: None   Collection Time: 06/11/15  4:30 AM  Result Value Ref Range Status   Specimen Description TRACHEAL ASPIRATE  Final   Special Requests NONE  Final   Sputum evaluation THIS SPECIMEN IS ACCEPTABLE FOR SPUTUM CULTURE  Final   Report Status 06/11/2015 FINAL  Final  Culture, respiratory (NON-Expectorated)     Status: None   Collection Time: 06/11/15  4:30 AM  Result Value Ref Range Status   Specimen Description TRACHEAL ASPIRATE  Final   Special Requests NONE Reflexed from [id number]  Final   Gram Stain   Final    GOOD SPECIMEN - 80-90% WBCS MODERATE WBC SEEN MODERATE GRAM POSITIVE COCCI RARE GRAM NEGATIVE RODS    Culture HEAVY GROWTH STREPTOCOCCUS PNEUMONIAE  Final   Report Status 06/13/2015 FINAL  Final   Organism ID, Bacteria STREPTOCOCCUS PNEUMONIAE  Final      Susceptibility   Streptococcus pneumoniae - MIC*    ERYTHROMYCIN <=0.12 SENSITIVE Sensitive     VANCOMYCIN 0.5 SENSITIVE Sensitive     TRIMETH/SULFA <=10 SENSITIVE Sensitive     CLINDAMYCIN <=0.25 SENSITIVE Sensitive     LEVOFLOXACIN Value in next row Sensitive      SENSITIVE1    LINEZOLID Value in next row Sensitive      SENSITIVE<=2    * HEAVY GROWTH STREPTOCOCCUS PNEUMONIAE     Assessment: New orders for ceftriaxone, will adjust to ceftriaxone 2gm IV [id number] per policy for CRRT  Plan:  Pharmacy will continue to monitor patient medications and make adjustments as need for appropriate CRRT dosing.   Rexene Edison, PharmD Clinical Pharmacist  Pharmacy Resident

## 2015-06-14 NOTE — Progress Notes (Signed)
PHARMACY - Consult for CRRT dosing and monitoring  Pharmacy Consult for CRRT dosing and monitoring     Allergies  Allergen Reactions  . Nsaids Nausea And Vomiting and Other (See Comments)    Reaction:  GI distress and burning     Patient Measurements: Height: 5\' 2"  (157.5 cm) Weight: 133 lb 9.6 oz (60.6 kg) IBW/kg (Calculated) : 50.1  Vital Signs: Temp: 97.3 F (36.3 C) (11/15 1000) Temp Source: Other (Comment) (11/15 0000) BP: 138/64 mmHg (11/15 1000) Pulse Rate: 74 (11/15 1000) Intake/Output from previous day: 11/14 0701 - 11/15 0700 In: 2966.8 [I.V.:2716.8; IV Piggyback:250] Out: 150 [Urine:150] Total I/O In: 150 [I.V.:150] Out: 0  Vent settings for last 24 hours: Vent Mode:  [-] PRVC FiO2 (%):  [28 %-30 %] 28 % Set Rate:  [16 bmp-24 bmp] 24 bmp Vt Set:  [400 mL] 400 mL PEEP:  [5 cmH20] 5 cmH20  Labs:  Recent Labs  06/11/15 1351  06/12/15 0330 06/12/15 1101 06/13/15 0559  06/13/15 2052  06/13/15 2057 06/14/15 0311 06/14/15 0543 06/14/15 0949  WBC  --   < > 19.0*  --  27.4*  --   --   --  24.5*  --  26.7*  --   HGB  --   < > 10.6*  --  8.7*  --   --   --  7.4*  --  7.5*  --   HCT  --   < > 35.5  --  28.9*  --   --   --  24.8*  --  25.3*  --   PLT  --   < > 178  --  152  --   --   --  124*  --  105*  --   APTT 30  --   --  29  --   --   --   --   --   --   --   --   INR 1.93  --   --   --   --   --   --   --  1.45  --  1.28  --   CREATININE  --   < > 3.13*  --  4.42*  --  4.82*  --   --  3.76* 3.16*  --   MG  --   --   --   --   --   --   --   < > 1.8 1.7 1.7 1.7  PHOS  --   --   --   --   --   < > 5.9*  --  5.9* 4.6 3.8  --   ALBUMIN  --   < > 2.4*  --   --   --  1.9*  --   --  2.0* 1.9*  --   PROT  --   --  5.3*  --   --   --   --   --   --   --  4.7*  --   AST  --   --  2031*  --   --   --   --   --   --   --  280*  --   ALT  --   --  1958*  --   --   --   --   --   --   --  819*  --   ALKPHOS  --   --  350*  --   --   --   --   --   --   --  187*  --    BILITOT  --   --  0.8  --   --   --   --   --   --   --  0.5  --   < > = values in this interval not displayed. Estimated Creatinine Clearance: 14.6 mL/min (by C-G formula based on Cr of 3.16).   Recent Labs  06/13/15 2328 06/14/15 0351 06/14/15 0738  GLUCAP 106* 128* 119*    Microbiology: Recent Results (from the past 720 hour(s))  Culture, blood (routine x 2)     Status: None   Collection Time: 05/21/15 10:49 PM  Result Value Ref Range Status   Specimen Description BLOOD RIGHT HAND  Final   Special Requests BOTTLES DRAWN AEROBIC AND ANAEROBIC 4CC  Final   Culture NO GROWTH 5 DAYS  Final   Report Status 05/26/2015 FINAL  Final  Culture, blood (routine x 2)     Status: None   Collection Time: 05/21/15 11:00 PM  Result Value Ref Range Status   Specimen Description BLOOD LEFT ASSIST CONTROL  Final   Special Requests BOTTLES DRAWN AEROBIC AND ANAEROBIC 4CC  Final   Culture NO GROWTH 5 DAYS  Final   Report Status 05/26/2015 FINAL  Final  MRSA PCR Screening     Status: None   Collection Time: 05/22/15  1:46 AM  Result Value Ref Range Status   MRSA by PCR NEGATIVE NEGATIVE Final    Comment:        The GeneXpert MRSA Assay (FDA approved for NASAL specimens only), is one component of a comprehensive MRSA colonization surveillance program. It is not intended to diagnose MRSA infection nor to guide or monitor treatment for MRSA infections.   Urine culture     Status: None   Collection Time: 06/10/15  7:30 PM  Result Value Ref Range Status   Specimen Description URINE, RANDOM  Final   Special Requests NONE  Final   Culture NO GROWTH 2 DAYS  Final   Report Status 06/12/2015 FINAL  Final  Blood Culture (routine x 2)     Status: None (Preliminary result)   Collection Time: 06/10/15  8:10 PM  Result Value Ref Range Status   Specimen Description BLOOD RIGHT  Final   Special Requests BOTTLES DRAWN AEROBIC AND ANAEROBIC 5CC  Final   Culture NO GROWTH 4 DAYS  Final   Report  Status PENDING  Incomplete  Blood Culture (routine x 2)     Status: None (Preliminary result)   Collection Time: 06/10/15  8:20 PM  Result Value Ref Range Status   Specimen Description BLOOD RIGHT  Final   Special Requests BOTTLES DRAWN AEROBIC AND ANAEROBIC 2CC AERO, 4CC  Final   Culture NO GROWTH 4 DAYS  Final   Report Status PENDING  Incomplete  MRSA PCR Screening     Status: Abnormal   Collection Time: 06/11/15  1:40 AM  Result Value Ref Range Status   MRSA by PCR POSITIVE (A) NEGATIVE Final    Comment:        The GeneXpert MRSA Assay (FDA approved for NASAL specimens only), is one component of a comprehensive MRSA colonization surveillance program. It is not intended to diagnose MRSA infection nor to guide or monitor treatment for MRSA infections. CRITICAL RESULT CALLED TO, READ BACK BY AND VERIFIED WITH: [facility] ALLEN AT [id number] 06/11/15.PMH   Culture, sputum-assessment     Status: None   Collection Time: 06/11/15  4:30 AM  Result Value  Ref Range Status   Specimen Description TRACHEAL ASPIRATE  Final   Special Requests NONE  Final   Sputum evaluation THIS SPECIMEN IS ACCEPTABLE FOR SPUTUM CULTURE  Final   Report Status 06/11/2015 FINAL  Final  Culture, respiratory (NON-Expectorated)     Status: None   Collection Time: 06/11/15  4:30 AM  Result Value Ref Range Status   Specimen Description TRACHEAL ASPIRATE  Final   Special Requests NONE Reflexed from [id number]  Final   Gram Stain   Final    GOOD SPECIMEN - 80-90% WBCS MODERATE WBC SEEN MODERATE GRAM POSITIVE COCCI RARE GRAM NEGATIVE RODS    Culture HEAVY GROWTH STREPTOCOCCUS PNEUMONIAE  Final   Report Status 06/13/2015 FINAL  Final   Organism ID, Bacteria STREPTOCOCCUS PNEUMONIAE  Final      Susceptibility   Streptococcus pneumoniae - MIC*    ERYTHROMYCIN <=0.12 SENSITIVE Sensitive     VANCOMYCIN 0.5 SENSITIVE Sensitive     TRIMETH/SULFA <=10 SENSITIVE Sensitive     CLINDAMYCIN <=0.25 SENSITIVE Sensitive      LEVOFLOXACIN Value in next row Sensitive      SENSITIVE1    LINEZOLID Value in next row Sensitive      SENSITIVE<=2    * HEAVY GROWTH STREPTOCOCCUS PNEUMONIAE    Assessment: All medications are appropriately dosed for CRRT at this time.   Plan:  Pharmacy will continue to monitor patient medications and make adjustments as need for appropriate CRRT dosing.   Nancy Fetter, PharmD Pharmacy Resident

## 2015-06-14 NOTE — Progress Notes (Signed)
[facility]  ROUNDING NOTE   Subjective:  Patient remains critically ill. Had some bleeding from her dialysis catheter last night. This appears to have improved now. Progressing well on CRRT.   Objective:  Vital signs in last 24 hours:  Temp:  [95.9 F (35.5 C)-99 F (37.2 C)] 96.3 F (35.7 C) (11/15 0700) Pulse Rate:  [63-87] 65 (11/15 0700) Resp:  [15-32] 15 (11/15 0700) BP: (93-128)/(41-93) 119/57 mmHg (11/15 0700) SpO2:  [93 %-100 %] 99 % (11/15 0700) FiO2 (%):  [30 %] 30 % (11/15 0436) Weight:  [60.6 kg (133 lb 9.6 oz)] 60.6 kg (133 lb 9.6 oz) (11/15 0419)  Weight change: 5.7 kg (12 lb 9.1 oz) Filed Weights   06/12/15 0542 06/13/15 0425 06/14/15 0419  Weight: 54.9 kg (121 lb 0.5 oz) 54.9 kg (121 lb 0.5 oz) 60.6 kg (133 lb 9.6 oz)    Intake/Output: I/O last 3 completed shifts: In: 4278.7 [I.V.:3758.7; NG/GT:220; IV Piggyback:300] Out: 160 [Urine:160]   Intake/Output this shift:     Physical Exam: General: Critically ill appearing  Head: Healing wound on forehead ETT in place  Eyes: Eyes closed  Neck: Supple, trachea midline  Lungs:  Bilateral rhonchi, vent assisted  Heart: S1S2 no rubs  Abdomen:  Soft, nontender, BS present   Extremities:  trace peripheral edema.  Neurologic: Not following commands, on the vent  Skin: Large forehead wound that is healing       Basic Metabolic Panel:  Recent Labs Lab 06/11/15 0229 06/12/15 0330 06/13/15 0559 06/13/15 2052 06/13/15 2057 06/14/15 0311 06/14/15 0543  NA 139 139 137 136  --  137 134*  K 5.0 5.3* 4.0 3.9  --  4.0 3.9  CL 104 109 103 101  --  102 100*  CO2 27 21* 26 26  --  27 29  GLUCOSE 260* 159* 121* 111*  --  130* 131*  BUN 29* 49* 73* 79*  --  63* 54*  CREATININE 1.63* 3.13* 4.42* 4.82*  --  3.76* 3.16*  CALCIUM 7.4* 7.2* 7.0* 7.1*  --  7.5* 7.6*  MG 1.6*  --   --   --  1.8 1.7 1.7  PHOS 6.6*  --   --  5.9* 5.9* 4.6 3.8    Liver Function Tests:  Recent Labs Lab  06/10/15 1930 06/11/15 0229 06/12/15 0330 06/13/15 2052 06/14/15 0311 06/14/15 0543  AST 1138* >2275* 2031*  --   --  280*  ALT 643* 2215* 1958*  --   --  819*  ALKPHOS 373* 379* 350*  --   --  187*  BILITOT 1.2 1.4* 0.8  --   --  0.5  PROT 5.8* 5.2* 5.3*  --   --  4.7*  ALBUMIN 2.6* 2.5* 2.4* 1.9* 2.0* 1.9*    Recent Labs Lab 06/10/15 1930  LIPASE 46   No results for input(s): AMMONIA in the last 168 hours.  CBC:  Recent Labs Lab 06/10/15 1930 06/11/15 0229 06/12/15 0330 06/13/15 0559 06/13/15 2057 06/14/15 0543  WBC 21.1* 26.4* 19.0* 27.4* 24.5* 26.7*  NEUTROABS 19.6*  --   --   --  22.7*  --   HGB 10.2* 10.9* 10.6* 8.7* 7.4* 7.5*  HCT 35.3 36.7 35.5 28.9* 24.8* 25.3*  MCV 69.1* 68.5* 68.3* 66.2* 65.3* 65.5*  PLT 219 211 178 152 124* 105*    Cardiac Enzymes:  Recent Labs Lab 06/10/15 1930 06/11/15 0229 06/11/15 1351 06/11/15 2252 06/12/15 0540  TROPONINI 0.76* 8.37* 17.69* 15.54*  12.20*    BNP: Invalid input(s): POCBNP  CBG:  Recent Labs Lab 06/13/15 1136 06/13/15 1625 06/13/15 1929 06/13/15 2328 06/14/15 0351  GLUCAP 133* 150* 104* 106* 128*    Microbiology: Results for orders placed or performed during the hospital encounter of 06/10/15  Urine culture     Status: None   Collection Time: 06/10/15  7:30 PM  Result Value Ref Range Status   Specimen Description URINE, RANDOM  Final   Special Requests NONE  Final   Culture NO GROWTH 2 DAYS  Final   Report Status 06/12/2015 FINAL  Final  Blood Culture (routine x 2)     Status: None (Preliminary result)   Collection Time: 06/10/15  8:10 PM  Result Value Ref Range Status   Specimen Description BLOOD RIGHT  Final   Special Requests BOTTLES DRAWN AEROBIC AND ANAEROBIC 5CC  Final   Culture NO GROWTH 3 DAYS  Final   Report Status PENDING  Incomplete  Blood Culture (routine x 2)     Status: None (Preliminary result)   Collection Time: 06/10/15  8:20 PM  Result Value Ref Range Status    Specimen Description BLOOD RIGHT  Final   Special Requests BOTTLES DRAWN AEROBIC AND ANAEROBIC 2CC AERO, 4CC  Final   Culture NO GROWTH 3 DAYS  Final   Report Status PENDING  Incomplete  MRSA PCR Screening     Status: Abnormal   Collection Time: 06/11/15  1:40 AM  Result Value Ref Range Status   MRSA by PCR POSITIVE (A) NEGATIVE Final    Comment:        The GeneXpert MRSA Assay (FDA approved for NASAL specimens only), is one component of a comprehensive MRSA colonization surveillance program. It is not intended to diagnose MRSA infection nor to guide or monitor treatment for MRSA infections. CRITICAL RESULT CALLED TO, READ BACK BY AND VERIFIED WITH: [facility] ALLEN AT [id number] 06/11/15.PMH   Culture, sputum-assessment     Status: None   Collection Time: 06/11/15  4:30 AM  Result Value Ref Range Status   Specimen Description TRACHEAL ASPIRATE  Final   Special Requests NONE  Final   Sputum evaluation THIS SPECIMEN IS ACCEPTABLE FOR SPUTUM CULTURE  Final   Report Status 06/11/2015 FINAL  Final  Culture, respiratory (NON-Expectorated)     Status: None   Collection Time: 06/11/15  4:30 AM  Result Value Ref Range Status   Specimen Description TRACHEAL ASPIRATE  Final   Special Requests NONE Reflexed from [id number]  Final   Gram Stain   Final    GOOD SPECIMEN - 80-90% WBCS MODERATE WBC SEEN MODERATE GRAM POSITIVE COCCI RARE GRAM NEGATIVE RODS    Culture HEAVY GROWTH STREPTOCOCCUS PNEUMONIAE  Final   Report Status 06/13/2015 FINAL  Final   Organism ID, Bacteria STREPTOCOCCUS PNEUMONIAE  Final      Susceptibility   Streptococcus pneumoniae - MIC*    ERYTHROMYCIN <=0.12 SENSITIVE Sensitive     VANCOMYCIN 0.5 SENSITIVE Sensitive     TRIMETH/SULFA <=10 SENSITIVE Sensitive     CLINDAMYCIN <=0.25 SENSITIVE Sensitive     LEVOFLOXACIN Value in next row Sensitive      SENSITIVE1    LINEZOLID Value in next row Sensitive      SENSITIVE<=2    * HEAVY GROWTH STREPTOCOCCUS PNEUMONIAE     Coagulation Studies:  Recent Labs  06/11/15 1351 06/13/15 2057 06/14/15 0543  LABPROT 22.0* 17.7* 16.1*  INR 1.93 1.45 1.28    Urinalysis: No results for input(s):  COLORURINE, LABSPEC, [place], GLUCOSEU, HGBUR, BILIRUBINUR, KETONESUR, PROTEINUR, UROBILINOGEN, NITRITE, LEUKOCYTESUR in the last 72 hours.  Invalid input(s): APPERANCEUR    Imaging: Dg Chest Port 1 View  06/13/2015  CLINICAL DATA:  Respiratory failure EXAM: PORTABLE CHEST 1 VIEW COMPARISON:  06/12/2015 FINDINGS: Endotracheal tube tip 15 mm above the carina. Central venous catheter tip in the left innominate vein unchanged. No pneumothorax. AICD unchanged. Mild bibasilar airspace disease shows mild progression and probably is atelectasis. No significant edema or effusion. IMPRESSION: Endotracheal tube 15 mm above the carina. Increased bibasilar atelectasis. Electronically Signed   By: Franchot Gallo M.D.   On: 06/13/2015 07:45     Medications:   . fentaNYL infusion INTRAVENOUS 100 mcg/hr (06/14/15 0600)  . norepinephrine 2 mcg/min (06/13/15 2330)  . pureflow 3 each (06/13/15 1400)  .  sodium bicarbonate  infusion 1000 mL 75 mL/hr at 06/13/15 2330   . antiseptic oral rinse  7 mL Mouth Rinse QID  . aspirin  81 mg Per Tube Daily  . azithromycin  500 mg Intravenous Daily  . budesonide  0.25 mg Nebulization BID  . chlorhexidine gluconate  15 mL Mouth Rinse BID  . Chlorhexidine Gluconate Cloth  6 each Topical Q0600  . feeding supplement (VITAL HIGH PROTEIN)  1,000 mL Per Tube Q24H  . heparin subcutaneous  5,000 Units Subcutaneous Q12H  . hydrocortisone sod succinate (SOLU-CORTEF) inj  50 mg Intravenous Q8H  . insulin aspart  0-15 Units Subcutaneous 6 times per day  . ipratropium-albuterol  3 mL Nebulization Q6H  . mupirocin ointment  1 application Nasal BID  . pantoprazole (PROTONIX) IV  40 mg Intravenous Q24H   acetaminophen **OR** acetaminophen, heparin, polyvinyl alcohol  Assessment/ Plan:  69 y.o. female   with a PMHx of COPD, hypertension, ischemic cardiomyopathy with multiple coronary artery stents, history of ventricular tachycardia, peripheral vascular disease, hyperlipidemia, chronic back pain, history of congestive heart failure, who was admitted to [facility] on 06/10/2015 for evaluation of respiratory failure.   1. Acute renal failure secondary to acute tubular necrosis. Patient presented with multiorgan failure upon presentation. Baseline creatinine 0.75. -Continue CRRT at this point in time. Urine output was only 150 cc over the past 24 hours. Continue to follow electrolytes closely.  2. Hyperkalemia. Potassium normalized at 3.9. Continue for potassium bath at present..  3. Acute respiratory failure. FiO2 currently 30%. Remains on the ventilator at present. Continue weaning efforts as tolerated.  4. Acute myocardial infarction.  Management per pulmonary critical care and hospitalist.    LOS: 4 Emmy Keng 11/15/20167:19 AM

## 2015-06-14 NOTE — Progress Notes (Signed)
MEDICATION RELATED CONSULT NOTE - INITIAL   Pharmacy Consult for Constipation Prevention  Allergies  Allergen Reactions  . Nsaids Nausea And Vomiting and Other (See Comments)    Reaction:  GI distress and burning    Patient Measurements: Height: 5\' 2"  (157.5 cm) Weight: 133 lb 9.6 oz (60.6 kg) IBW/kg (Calculated) : 50.1  Vital Signs: Temp: 97.3 F (36.3 C) (11/15 1000) Temp Source: Other (Comment) (11/15 0000) BP: 138/64 mmHg (11/15 1000) Pulse Rate: 74 (11/15 1000) Intake/Output from previous day: 11/14 0701 - 11/15 0700 In: 2966.8 [I.V.:2716.8; IV Piggyback:250] Out: 150 [Urine:150] Intake/Output from this shift: Total I/O In: 150 [I.V.:150] Out: 0   Labs:  Recent Labs  06/11/15 1351  06/12/15 0330 06/12/15 1101 06/13/15 0559  06/13/15 2052  06/13/15 2057 06/14/15 0311 06/14/15 0543 06/14/15 0949  WBC  --   < > 19.0*  --  27.4*  --   --   --  24.5*  --  26.7*  --   HGB  --   < > 10.6*  --  8.7*  --   --   --  7.4*  --  7.5*  --   HCT  --   < > 35.5  --  28.9*  --   --   --  24.8*  --  25.3*  --   PLT  --   < > 178  --  152  --   --   --  124*  --  105*  --   APTT 30  --   --  29  --   --   --   --   --   --   --   --   CREATININE  --   < > 3.13*  --  4.42*  --  4.82*  --   --  3.76* 3.16*  --   MG  --   --   --   --   --   --   --   < > 1.8 1.7 1.7 1.7  PHOS  --   --   --   --   --   < > 5.9*  --  5.9* 4.6 3.8  --   ALBUMIN  --   < > 2.4*  --   --   --  1.9*  --   --  2.0* 1.9*  --   PROT  --   --  5.3*  --   --   --   --   --   --   --  4.7*  --   AST  --   --  2031*  --   --   --   --   --   --   --  280*  --   ALT  --   --  1958*  --   --   --   --   --   --   --  819*  --   ALKPHOS  --   --  350*  --   --   --   --   --   --   --  187*  --   BILITOT  --   --  0.8  --   --   --   --   --   --   --  0.5  --   < > = values in this interval not displayed. Estimated Creatinine Clearance: 14.6 mL/min (by C-G formula  based on Cr of  3.16).   Microbiology: Recent Results (from the past 720 hour(s))  Culture, blood (routine x 2)     Status: None   Collection Time: 05/21/15 10:49 PM  Result Value Ref Range Status   Specimen Description BLOOD RIGHT HAND  Final   Special Requests BOTTLES DRAWN AEROBIC AND ANAEROBIC 4CC  Final   Culture NO GROWTH 5 DAYS  Final   Report Status 05/26/2015 FINAL  Final  Culture, blood (routine x 2)     Status: None   Collection Time: 05/21/15 11:00 PM  Result Value Ref Range Status   Specimen Description BLOOD LEFT ASSIST CONTROL  Final   Special Requests BOTTLES DRAWN AEROBIC AND ANAEROBIC 4CC  Final   Culture NO GROWTH 5 DAYS  Final   Report Status 05/26/2015 FINAL  Final  MRSA PCR Screening     Status: None   Collection Time: 05/22/15  1:46 AM  Result Value Ref Range Status   MRSA by PCR NEGATIVE NEGATIVE Final    Comment:        The GeneXpert MRSA Assay (FDA approved for NASAL specimens only), is one component of a comprehensive MRSA colonization surveillance program. It is not intended to diagnose MRSA infection nor to guide or monitor treatment for MRSA infections.   Urine culture     Status: None   Collection Time: 06/10/15  7:30 PM  Result Value Ref Range Status   Specimen Description URINE, RANDOM  Final   Special Requests NONE  Final   Culture NO GROWTH 2 DAYS  Final   Report Status 06/12/2015 FINAL  Final  Blood Culture (routine x 2)     Status: None (Preliminary result)   Collection Time: 06/10/15  8:10 PM  Result Value Ref Range Status   Specimen Description BLOOD RIGHT  Final   Special Requests BOTTLES DRAWN AEROBIC AND ANAEROBIC 5CC  Final   Culture NO GROWTH 4 DAYS  Final   Report Status PENDING  Incomplete  Blood Culture (routine x 2)     Status: None (Preliminary result)   Collection Time: 06/10/15  8:20 PM  Result Value Ref Range Status   Specimen Description BLOOD RIGHT  Final   Special Requests BOTTLES DRAWN AEROBIC AND ANAEROBIC 2CC AERO, 4CC   Final   Culture NO GROWTH 4 DAYS  Final   Report Status PENDING  Incomplete  MRSA PCR Screening     Status: Abnormal   Collection Time: 06/11/15  1:40 AM  Result Value Ref Range Status   MRSA by PCR POSITIVE (A) NEGATIVE Final    Comment:        The GeneXpert MRSA Assay (FDA approved for NASAL specimens only), is one component of a comprehensive MRSA colonization surveillance program. It is not intended to diagnose MRSA infection nor to guide or monitor treatment for MRSA infections. CRITICAL RESULT CALLED TO, READ BACK BY AND VERIFIED WITH: [facility] ALLEN AT [id number] 06/11/15.PMH   Culture, sputum-assessment     Status: None   Collection Time: 06/11/15  4:30 AM  Result Value Ref Range Status   Specimen Description TRACHEAL ASPIRATE  Final   Special Requests NONE  Final   Sputum evaluation THIS SPECIMEN IS ACCEPTABLE FOR SPUTUM CULTURE  Final   Report Status 06/11/2015 FINAL  Final  Culture, respiratory (NON-Expectorated)     Status: None   Collection Time: 06/11/15  4:30 AM  Result Value Ref Range Status   Specimen Description TRACHEAL ASPIRATE  Final  Special Requests NONE Reflexed from [id number]  Final   Gram Stain   Final    GOOD SPECIMEN - 80-90% WBCS MODERATE WBC SEEN MODERATE GRAM POSITIVE COCCI RARE GRAM NEGATIVE RODS    Culture HEAVY GROWTH STREPTOCOCCUS PNEUMONIAE  Final   Report Status 06/13/2015 FINAL  Final   Organism ID, Bacteria STREPTOCOCCUS PNEUMONIAE  Final      Susceptibility   Streptococcus pneumoniae - MIC*    ERYTHROMYCIN <=0.12 SENSITIVE Sensitive     VANCOMYCIN 0.5 SENSITIVE Sensitive     TRIMETH/SULFA <=10 SENSITIVE Sensitive     CLINDAMYCIN <=0.25 SENSITIVE Sensitive     LEVOFLOXACIN Value in next row Sensitive      SENSITIVE1    LINEZOLID Value in next row Sensitive      SENSITIVE<=2    * HEAVY GROWTH STREPTOCOCCUS PNEUMONIAE    Medical History: Past Medical History  Diagnosis Date  . COPD (chronic obstructive pulmonary disease) ([place])   .  Hypertension   . Ischemic cardiomyopathy     a.   . Coronary artery disease     a. s/p multiple stenting in 2005  . History of ventricular tachycardia   . PVD (peripheral vascular disease) ([place])   . PAD (peripheral artery disease) ([place])   . HLD (hyperlipidemia)   . Chronic back pain   . MI, old   . MRSA (methicillin resistant staph aureus) culture positive     left foot wound  . CHF (congestive heart failure) ([place])   . Pneumonia   . Asthma   . Cancer [facility])     Assessment: 69 yo female requiring constipation prophylaxis. Pharmacy consulted for management and monitoring.   Plan:  Will start senna/docusate twice daily. Will add on Miralax tomorrow if no progress.   Nancy Fetter, PharmD Pharmacy Resident

## 2015-06-14 NOTE — Progress Notes (Signed)
Dr. Holley Raring, nephrologist, paged by secretary for RN. Patient's magnesium level 1.6 on CRRT.

## 2015-06-14 NOTE — Progress Notes (Signed)
Spoke with Dr. Holley Raring on the phone about patient's magnesium level of 1.6. MD ordered 2 g of IV magnesium once.

## 2015-06-14 NOTE — Progress Notes (Signed)
Nutrition Follow-up    INTERVENTION:   EN: recommend increasing TF to goal rate of 45 ml/hr as per order  NUTRITION DIAGNOSIS:   Inadequate oral intake related to inability to eat as evidenced by NPO status. Being addressed via TF  GOAL:   Patient will meet greater than or equal to 90% of their needs  MONITOR:    (Energy Intake, Pulmonary Profile, Anthropometrics, Gastrointestinal Profile, Electrolyte and renal Profile)  REASON FOR ASSESSMENT:   Ventilator    ASSESSMENT:   Pt admitted after being found unresponsive by family. Pt currently sedated on the vent secondary to septic shock with acute on chronic respiratory failure.   Pt remains on vent, on CRRT, warming blanket for hypothermia  EN: tolerating Vital High Protein at rate of 35 ml/hr  Digestive System: no signs of TF intolerance     Electrolyte and Renal Profile:  Recent Labs Lab 06/14/15 0311 06/14/15 0543 06/14/15 0949  BUN 63* 54* 52*  CREATININE 3.76* 3.16* 3.07*  NA 137 134* 136  K 4.0 3.9 3.9  MG 1.7 1.7 1.7  PHOS 4.6 3.8 3.9   Glucose Profile:  Recent Labs  06/14/15 0351 06/14/15 0738 06/14/15 1134  GLUCAP 128* 119* 104*   Meds: ss novolog, levophed  Height:   Ht Readings from Last 1 Encounters:  06/11/15 5\' 2"  (1.575 m)    Weight:   Wt Readings from Last 1 Encounters:  06/14/15 133 lb 9.6 oz (60.6 kg)    Filed Weights   06/12/15 0542 06/13/15 0425 06/14/15 0419  Weight: 121 lb 0.5 oz (54.9 kg) 121 lb 0.5 oz (54.9 kg) 133 lb 9.6 oz (60.6 kg)    BMI:  Body mass index is 24.43 kg/(m^2).  Estimated Nutritional Needs:   Kcal:  1173kcals, (BEE: 1032, Ve: 6.4, TMax: 37.1) using current weight of 54.9kg  Protein:  74-98g protein (1.5-2.0g/kg)  Fluid:  1225-1411mL of fluid (25-52mL/kg)  EDUCATION NEEDS:   Education needs no appropriate at this time  [place], RD, LDN [phone] Pager

## 2015-06-15 LAB — RENAL FUNCTION PANEL
ALBUMIN: 1.9 g/dL — AB (ref 3.5–5.0)
ALBUMIN: 2 g/dL — AB (ref 3.5–5.0)
ALBUMIN: 2 g/dL — AB (ref 3.5–5.0)
ANION GAP: 3 — AB (ref 5–15)
ANION GAP: 1 — AB (ref 5–15)
ANION GAP: 6 (ref 5–15)
ANION GAP: 4 — AB (ref 5–15)
Albumin: 2.1 g/dL — ABNORMAL LOW (ref 3.5–5.0)
Albumin: 2.1 g/dL — ABNORMAL LOW (ref 3.5–5.0)
Albumin: 2.1 g/dL — ABNORMAL LOW (ref 3.5–5.0)
Albumin: 2 g/dL — ABNORMAL LOW (ref 3.5–5.0)
Anion gap: 2 — ABNORMAL LOW (ref 5–15)
Anion gap: 4 — ABNORMAL LOW (ref 5–15)
Anion gap: 4 — ABNORMAL LOW (ref 5–15)
BUN: 27 mg/dL — AB (ref 6–20)
BUN: 25 mg/dL — ABNORMAL HIGH (ref 6–20)
BUN: 26 mg/dL — ABNORMAL HIGH (ref 6–20)
BUN: 29 mg/dL — AB (ref 6–20)
BUN: 26 mg/dL — ABNORMAL HIGH (ref 6–20)
BUN: 21 mg/dL — AB (ref 6–20)
BUN: 19 mg/dL (ref 6–20)
CALCIUM: 8.2 mg/dL — AB (ref 8.9–10.3)
CALCIUM: 8.2 mg/dL — AB (ref 8.9–10.3)
CALCIUM: 8.2 mg/dL — AB (ref 8.9–10.3)
CALCIUM: 8.2 mg/dL — AB (ref 8.9–10.3)
CHLORIDE: 107 mmol/L (ref 101–111)
CHLORIDE: 107 mmol/L (ref 101–111)
CHLORIDE: 106 mmol/L (ref 101–111)
CHLORIDE: 106 mmol/L (ref 101–111)
CO2: 30 mmol/L (ref 22–32)
CO2: 30 mmol/L (ref 22–32)
CO2: 30 mmol/L (ref 22–32)
CO2: 28 mmol/L (ref 22–32)
CO2: 28 mmol/L (ref 22–32)
CO2: 28 mmol/L (ref 22–32)
CO2: 29 mmol/L (ref 22–32)
CREATININE: 1.51 mg/dL — AB (ref 0.44–1.00)
CREATININE: 1.04 mg/dL — AB (ref 0.44–1.00)
Calcium: 8.1 mg/dL — ABNORMAL LOW (ref 8.9–10.3)
Calcium: 8.1 mg/dL — ABNORMAL LOW (ref 8.9–10.3)
Calcium: 8.2 mg/dL — ABNORMAL LOW (ref 8.9–10.3)
Chloride: 109 mmol/L (ref 101–111)
Chloride: 105 mmol/L (ref 101–111)
Chloride: 106 mmol/L (ref 101–111)
Creatinine, Ser: 1.6 mg/dL — ABNORMAL HIGH (ref 0.44–1.00)
Creatinine, Ser: 1.41 mg/dL — ABNORMAL HIGH (ref 0.44–1.00)
Creatinine, Ser: 1.46 mg/dL — ABNORMAL HIGH (ref 0.44–1.00)
Creatinine, Ser: 1.36 mg/dL — ABNORMAL HIGH (ref 0.44–1.00)
Creatinine, Ser: 1.22 mg/dL — ABNORMAL HIGH (ref 0.44–1.00)
GFR calc Af Amer: 37 mL/min — ABNORMAL LOW (ref >60)
GFR calc Af Amer: 40 mL/min — ABNORMAL LOW (ref >60)
GFR calc Af Amer: 52 mL/min — ABNORMAL LOW (ref >60)
GFR calc non Af Amer: 32 mL/min — ABNORMAL LOW (ref >60)
GFR calc non Af Amer: 37 mL/min — ABNORMAL LOW (ref >60)
GFR calc non Af Amer: 36 mL/min — ABNORMAL LOW (ref >60)
GFR, EST AFRICAN AMERICAN: 43 mL/min — AB (ref >60)
GFR, EST AFRICAN AMERICAN: 41 mL/min — AB (ref >60)
GFR, EST AFRICAN AMERICAN: 45 mL/min — AB (ref >60)
GFR, EST NON AFRICAN AMERICAN: 34 mL/min — AB (ref >60)
GFR, EST NON AFRICAN AMERICAN: 39 mL/min — AB (ref >60)
GFR, EST NON AFRICAN AMERICAN: 44 mL/min — AB (ref >60)
GFR, EST NON AFRICAN AMERICAN: 54 mL/min — AB (ref >60)
GLUCOSE: 119 mg/dL — AB (ref 65–99)
GLUCOSE: 128 mg/dL — AB (ref 65–99)
Glucose, Bld: 119 mg/dL — ABNORMAL HIGH (ref 65–99)
Glucose, Bld: 129 mg/dL — ABNORMAL HIGH (ref 65–99)
Glucose, Bld: 131 mg/dL — ABNORMAL HIGH (ref 65–99)
Glucose, Bld: 115 mg/dL — ABNORMAL HIGH (ref 65–99)
Glucose, Bld: 116 mg/dL — ABNORMAL HIGH (ref 65–99)
PHOSPHORUS: 1.8 mg/dL — AB (ref 2.5–4.6)
PHOSPHORUS: 1.6 mg/dL — AB (ref 2.5–4.6)
PHOSPHORUS: 1.9 mg/dL — AB (ref 2.5–4.6)
PHOSPHORUS: 1.8 mg/dL — AB (ref 2.5–4.6)
POTASSIUM: 4.2 mmol/L (ref 3.5–5.1)
POTASSIUM: 4.3 mmol/L (ref 3.5–5.1)
Phosphorus: 1.9 mg/dL — ABNORMAL LOW (ref 2.5–4.6)
Phosphorus: 1.8 mg/dL — ABNORMAL LOW (ref 2.5–4.6)
Phosphorus: 1.8 mg/dL — ABNORMAL LOW (ref 2.5–4.6)
Potassium: 4.1 mmol/L (ref 3.5–5.1)
Potassium: 3.9 mmol/L (ref 3.5–5.1)
Potassium: 3.8 mmol/L (ref 3.5–5.1)
Potassium: 4 mmol/L (ref 3.5–5.1)
Potassium: 4.2 mmol/L (ref 3.5–5.1)
SODIUM: 140 mmol/L (ref 135–145)
SODIUM: 139 mmol/L (ref 135–145)
SODIUM: 139 mmol/L (ref 135–145)
Sodium: 139 mmol/L (ref 135–145)
Sodium: 140 mmol/L (ref 135–145)
Sodium: 138 mmol/L (ref 135–145)
Sodium: 138 mmol/L (ref 135–145)

## 2015-06-15 LAB — BLOOD GAS, ARTERIAL
Acid-Base Excess: 3.9 mmol/L — ABNORMAL HIGH (ref 0.0–3.0)
Bicarbonate: 29.9 mEq/L — ABNORMAL HIGH (ref 21.0–28.0)
FIO2: 0.28
O2 Saturation: 71.1 %
PATIENT TEMPERATURE: 37
PCO2 ART: 53 mmHg — AB (ref 32.0–48.0)
PEEP/CPAP: 3 cmH2O
Pressure support: 5 cmH2O
pH, Arterial: 7.36 (ref 7.350–7.450)
pO2, Arterial: 39 mmHg — CL (ref 83.0–108.0)

## 2015-06-15 LAB — GLUCOSE, CAPILLARY
GLUCOSE-CAPILLARY: 112 mg/dL — AB (ref 65–99)
GLUCOSE-CAPILLARY: 114 mg/dL — AB (ref 65–99)
GLUCOSE-CAPILLARY: 161 mg/dL — AB (ref 65–99)
GLUCOSE-CAPILLARY: 67 mg/dL (ref 65–99)
GLUCOSE-CAPILLARY: 80 mg/dL (ref 65–99)
GLUCOSE-CAPILLARY: 84 mg/dL (ref 65–99)
Glucose-Capillary: 94 mg/dL (ref 65–99)

## 2015-06-15 LAB — MAGNESIUM
MAGNESIUM: 1.7 mg/dL (ref 1.7–2.4)
MAGNESIUM: 1.7 mg/dL (ref 1.7–2.4)
Magnesium: 2 mg/dL (ref 1.7–2.4)
Magnesium: 1.9 mg/dL (ref 1.7–2.4)
Magnesium: 1.8 mg/dL (ref 1.7–2.4)

## 2015-06-15 LAB — CULTURE, BLOOD (ROUTINE X 2): Culture: NO GROWTH

## 2015-06-15 MED ORDER — ALTEPLASE 2 MG IJ SOLR
2.0000 mg | Freq: Once | INTRAMUSCULAR | Status: AC
  Administered 2015-06-15: 2 mg
  Filled 2015-06-15: qty 2

## 2015-06-15 MED ORDER — POTASSIUM PHOSPHATES 15 MMOLE/5ML IV SOLN
10.0000 mmol | Freq: Once | INTRAVENOUS | Status: AC
  Administered 2015-06-15: 10 mmol via INTRAVENOUS
  Filled 2015-06-15: qty 3.33

## 2015-06-15 MED ORDER — POLYETHYLENE GLYCOL 3350 17 G PO PACK: 17.0000 g | PACK | Freq: Every day | ORAL | Status: DC

## 2015-06-15 MED ORDER — STERILE WATER FOR INJECTION IJ SOLN
INTRAMUSCULAR | Status: AC
  Filled 2015-06-15: qty 10

## 2015-06-15 MED ORDER — ALTEPLASE 2 MG IJ SOLR
2.0000 mg | Freq: Once | INTRAMUSCULAR | Status: AC
  Administered 2015-06-15: 4 mg
  Filled 2015-06-15: qty 2

## 2015-06-15 NOTE — Progress Notes (Signed)
Took over care of pt at 0700. Pt crrt was stopped due to catheter clotting. ATP instilled in catheter at 0650 per order.  Assessed catheter at 0830, unoccluded. crrt restared with no issues. Per dr Milana Huntsman, change UF to 50. Currently CRRT running. Pt tolerating well, vss will continue to monitor.

## 2015-06-15 NOTE — Therapy (Signed)
SBT ended after 50 min due to desaturation down to 85%, HR increased, increased work of breathing. Patient communicated that she was having difficulty breathing and in pain. Vent mode changed to back to [facility] at documented settings. Slow recovery, nurse increased pain medication. Dr.Kasa notified of same.

## 2015-06-15 NOTE — Care Management Note (Signed)
Case Management Note  Patient Details  Name: Lynn Butler MRN: [id number] Date of Birth: April 14, 1946  Subjective/Objective:  Attempt to wean this morning. Patient desaturated quickly after approximately 1 hour. Wean unsuccessful. Remains on CRT. Fentanyl gtt.                    Action/Plan:   Expected Discharge Date:                  Expected Discharge Plan:  [place]  In-House Referral:     Discharge planning Services  CM Consult  Post Acute Care Choice:    Choice offered to:     DME Arranged:    DME Agency:     HH Arranged:    [place] Agency:     Status of Service:  In process, will continue to follow  Medicare Important Message Given:    Date Medicare IM Given:    Medicare IM give by:    Date Additional Medicare IM Given:    Additional Medicare Important Message give by:     If discussed at [place] of Stay Meetings, dates discussed:    Additional Comments:  Jolly Mango, RN 06/15/2015, 1:36 PM

## 2015-06-15 NOTE — Progress Notes (Signed)
MEDICATION RELATED CONSULT NOTE - INITIAL   Pharmacy Consult for Constipation Prevention  Allergies  Allergen Reactions  . Nsaids Nausea And Vomiting and Other (See Comments)    Reaction:  GI distress and burning    Patient Measurements: Height: 5\' 2"  (157.5 cm) Weight: 132 lb 4.4 oz (60 kg) IBW/kg (Calculated) : 50.1  Vital Signs: Temp: 98.2 F (36.8 C) (11/16 1000) Temp Source: Rectal (11/16 0800) BP: 149/60 mmHg (11/16 1000) Pulse Rate: 90 (11/16 1000) Intake/Output from previous day: 11/15 0701 - 11/16 0700 In: 2852.7 [I.V.:1007.6; NG/GT:1495.1; IV Piggyback:350] Out: 85 [Urine:85] Intake/Output from this shift: Total I/O In: 631.9 [I.V.:92.7; NG/GT:239.3; IV Piggyback:300] Out: -   Labs:  Recent Labs  06/13/15 0559  06/13/15 2057  06/14/15 0543  06/14/15 2201 06/15/15 0156 06/15/15 0157 06/15/15 0607  WBC 27.4*  --  24.5*  --  26.7*  --   --   --   --   --   HGB 8.7*  --  7.4*  --  7.5*  --   --   --   --   --   HCT 28.9*  --  24.8*  --  25.3*  --   --   --   --   --   PLT 152  --  124*  --  105*  --   --   --   --   --   CREATININE 4.42*  < >  --   < > 3.16*  < > 1.60* 1.41*  --  1.46*  MG  --   --  1.8  < > 1.7  < > 2.3  --  2.0 1.9  PHOS  --   < > 5.9*  < > 3.8  < > 1.9* 1.8*  --  1.8*  ALBUMIN  --   < >  --   < > 1.9*  < > 2.1* 2.1*  --  1.9*  PROT  --   --   --   --  4.7*  --   --   --   --   --   AST  --   --   --   --  280*  --   --   --   --   --   ALT  --   --   --   --  819*  --   --   --   --   --   ALKPHOS  --   --   --   --  187*  --   --   --   --   --   BILITOT  --   --   --   --  0.5  --   --   --   --   --   < > = values in this interval not displayed. Estimated Creatinine Clearance: 29.2 mL/min (by C-G formula based on Cr of 1.46).   Microbiology: Recent Results (from the past 720 hour(s))  Culture, blood (routine x 2)     Status: None   Collection Time: 05/21/15 10:49 PM  Result Value Ref Range Status   Specimen Description BLOOD  RIGHT HAND  Final   Special Requests BOTTLES DRAWN AEROBIC AND ANAEROBIC 4CC  Final   Culture NO GROWTH 5 DAYS  Final   Report Status 05/26/2015 FINAL  Final  Culture, blood (routine x 2)     Status: None   Collection Time: 05/21/15 11:00 PM  Result Value Ref Range Status   Specimen Description BLOOD LEFT ASSIST CONTROL  Final   Special Requests BOTTLES DRAWN AEROBIC AND ANAEROBIC 4CC  Final   Culture NO GROWTH 5 DAYS  Final   Report Status 05/26/2015 FINAL  Final  MRSA PCR Screening     Status: None   Collection Time: 05/22/15  1:46 AM  Result Value Ref Range Status   MRSA by PCR NEGATIVE NEGATIVE Final    Comment:        The GeneXpert MRSA Assay (FDA approved for NASAL specimens only), is one component of a comprehensive MRSA colonization surveillance program. It is not intended to diagnose MRSA infection nor to guide or monitor treatment for MRSA infections.   Urine culture     Status: None   Collection Time: 06/10/15  7:30 PM  Result Value Ref Range Status   Specimen Description URINE, RANDOM  Final   Special Requests NONE  Final   Culture NO GROWTH 2 DAYS  Final   Report Status 06/12/2015 FINAL  Final  Blood Culture (routine x 2)     Status: None (Preliminary result)   Collection Time: 06/10/15  8:10 PM  Result Value Ref Range Status   Specimen Description BLOOD RIGHT  Final   Special Requests BOTTLES DRAWN AEROBIC AND ANAEROBIC 5CC  Final   Culture  Setup Time   Final    GRAM POSITIVE COCCI AEROBIC BOTTLE ONLY CRITICAL RESULT CALLED TO, READ BACK BY AND VERIFIED WITH: RN Kathi Simpers 06/14/15 1358    Culture   Final    COAGULASE NEGATIVE STAPHYLOCOCCUS Results consistent with contamination.    Report Status PENDING  Incomplete  Blood Culture (routine x 2)     Status: None   Collection Time: 06/10/15  8:20 PM  Result Value Ref Range Status   Specimen Description BLOOD RIGHT  Final   Special Requests BOTTLES DRAWN AEROBIC AND ANAEROBIC 2CC AERO, 4CC   Final   Culture NO GROWTH 5 DAYS  Final   Report Status 06/15/2015 FINAL  Final  MRSA PCR Screening     Status: Abnormal   Collection Time: 06/11/15  1:40 AM  Result Value Ref Range Status   MRSA by PCR POSITIVE (A) NEGATIVE Final    Comment:        The GeneXpert MRSA Assay (FDA approved for NASAL specimens only), is one component of a comprehensive MRSA colonization surveillance program. It is not intended to diagnose MRSA infection nor to guide or monitor treatment for MRSA infections. CRITICAL RESULT CALLED TO, READ BACK BY AND VERIFIED WITH: [facility] ALLEN AT [id number] 06/11/15.PMH   Culture, sputum-assessment     Status: None   Collection Time: 06/11/15  4:30 AM  Result Value Ref Range Status   Specimen Description TRACHEAL ASPIRATE  Final   Special Requests NONE  Final   Sputum evaluation THIS SPECIMEN IS ACCEPTABLE FOR SPUTUM CULTURE  Final   Report Status 06/11/2015 FINAL  Final  Culture, respiratory (NON-Expectorated)     Status: None   Collection Time: 06/11/15  4:30 AM  Result Value Ref Range Status   Specimen Description TRACHEAL ASPIRATE  Final   Special Requests NONE Reflexed from [id number]  Final   Gram Stain   Final    GOOD SPECIMEN - 80-90% WBCS MODERATE WBC SEEN MODERATE GRAM POSITIVE COCCI RARE GRAM NEGATIVE RODS    Culture HEAVY GROWTH STREPTOCOCCUS PNEUMONIAE  Final   Report Status 06/13/2015 FINAL  Final   Organism ID, Bacteria STREPTOCOCCUS  PNEUMONIAE  Final      Susceptibility   Streptococcus pneumoniae - MIC*    ERYTHROMYCIN <=0.12 SENSITIVE Sensitive     VANCOMYCIN 0.5 SENSITIVE Sensitive     TRIMETH/SULFA <=10 SENSITIVE Sensitive     CLINDAMYCIN <=0.25 SENSITIVE Sensitive     LEVOFLOXACIN Value in next row Sensitive      SENSITIVE1    LINEZOLID Value in next row Sensitive      SENSITIVE<=2    * HEAVY GROWTH STREPTOCOCCUS PNEUMONIAE    Medical History: Past Medical History  Diagnosis Date  . COPD (chronic obstructive pulmonary disease) ([place])    . Hypertension   . Ischemic cardiomyopathy     a.   . Coronary artery disease     a. s/p multiple stenting in 2005  . History of ventricular tachycardia   . PVD (peripheral vascular disease) ([place])   . PAD (peripheral artery disease) ([place])   . HLD (hyperlipidemia)   . Chronic back pain   . MI, old   . MRSA (methicillin resistant staph aureus) culture positive     left foot wound  . CHF (congestive heart failure) ([place])   . Pneumonia   . Asthma   . Cancer [facility])     Assessment: 69 yo female requiring constipation prophylaxis. Pharmacy consulted for management and monitoring.   Plan:  No BM to date so will add Miralax to current regimen of senna-docusate and f/u AM.    Ulice Dash, PharmD

## 2015-06-15 NOTE — Progress Notes (Signed)
Pt lying in bed will open eyes to verbal stimuli and follow some commands. Pt received CRRT this shift until  6 am when line clotted spoke with Dr. Holley Raring orders given to Tpa lines. Both lines injected with 1.49ml. Pt has been off levophed since 1130pm.  Cont. On fentanyl @400mcg . Will cont to monitor

## 2015-06-15 NOTE — Progress Notes (Signed)
Nutrition Follow-up    INTERVENTION:   EN: recommend continuing current TF regimen at current goal rate if unable to extubate Nutrition related medication management: recommend supplementing phosphorus (1.8 today); discussed during ICU rounds  NUTRITION DIAGNOSIS:   Inadequate oral intake related to inability to eat as evidenced by NPO status. Being addressed via TF  GOAL:   Patient will meet greater than or equal to 90% of their needs  MONITOR:    (Energy Intake, Pulmonary Profile, Anthropometrics, Gastrointestinal Profile, Electrolyte and renal Profile)  REASON FOR ASSESSMENT:   Ventilator    ASSESSMENT:   Pt admitted after being found unresponsive by family. Pt currently sedated on the vent secondary to septic shock with acute on chronic respiratory failure.   Pt remains on vent, on CRRT with UF of 50 ml/hr, possible extubation  EN: tolerating Vital High Protein at rate of 45 ml/hr  Digestive System: no signs of TF intolerance, last BM documented 11/15  Electrolyte and Renal Profile:  Recent Labs Lab 06/14/15 2201 06/15/15 0156 06/15/15 0157 06/15/15 0607  BUN 27* 25*  --  26*  CREATININE 1.60* 1.41*  --  1.46*  NA 139 140  --  140  K 4.2 4.3  --  4.1  MG 2.3  --  2.0 1.9  PHOS 1.9* 1.8*  --  1.8*   Glucose Profile:  Recent Labs  06/14/15 2014 06/15/15 0046 06/15/15 0346  GLUCAP 116* 112* 94   Meds: ss novolog Height:   Ht Readings from Last 1 Encounters:  06/11/15 5\' 2"  (1.575 m)    Weight:   Wt Readings from Last 1 Encounters:  06/15/15 132 lb 4.4 oz (60 kg)    Filed Weights   06/13/15 0425 06/14/15 0419 06/15/15 0540  Weight: 121 lb 0.5 oz (54.9 kg) 133 lb 9.6 oz (60.6 kg) 132 lb 4.4 oz (60 kg)    BMI:  Body mass index is 24.19 kg/(m^2).  Estimated Nutritional Needs:   Kcal:  1173kcals, (BEE: 1032, Ve: 6.4, TMax: 37.1) using current weight of 54.9kg  Protein:  74-98g protein (1.5-2.0g/kg)  Fluid:  1225-1423mL of fluid  (25-48mL/kg)  EDUCATION NEEDS:   Education needs no appropriate at this time  [place], RD, LDN [phone] Pager

## 2015-06-15 NOTE — Progress Notes (Signed)
Patient ID: Lynn Butler, female   DOB: [birth year], 69 y.o.   MRN: [id number] [facility] PROGRESS NOTE  PCP: Juanell Fairly, MD  HPI/Subjective: Patient on ventilator and sedation but is able to follow commands today. As per the nurse, she was on a weaning trial today but then desaturated and had a be re-sedated. The patient shook her head that she was having chest pain, abdominal pain and leg pain but I could not get much more information from her since she is on the ventilator  Objective: Filed Vitals:   06/15/15 1400  BP: 129/56  Pulse:   Temp: 96.4 F (35.8 C)  Resp: 20    Filed Weights   06/13/15 0425 06/14/15 0419 06/15/15 0540  Weight: 54.9 kg (121 lb 0.5 oz) 60.6 kg (133 lb 9.6 oz) 60 kg (132 lb 4.4 oz)    ROS: Review of Systems  Respiratory: Positive for shortness of breath.   Cardiovascular: Positive for chest pain.  Gastrointestinal: Positive for abdominal pain.  Musculoskeletal: Positive for joint pain.   limited because patient on ventilator Exam: Physical Exam  Constitutional: She is intubated.  HENT:  Nose: No mucosal edema.  Unable to look and mouth  Eyes:  Left pupil slightly larger than right pupil.  Neck: Carotid bruit is not present. No thyromegaly present.  Cardiovascular:  Murmur heard.  Systolic murmur is present with a grade of 2/6  Pulses:      Dorsalis pedis pulses are 0 on the right side, and 0 on the left side.  Respiratory: She is intubated. She has decreased breath sounds in the right middle field, the right lower field and the left lower field. She has wheezes in the right lower field and the left lower field.  GI: Soft. There is no tenderness.  Musculoskeletal:       Right ankle: She exhibits no swelling.       Left ankle: She exhibits no swelling.  Neurological:  Patient able to move her arms and wiggle her toes.  Skin:  Patient had plastic surgery on her for head and face with skin grafts. Scab on the 4 head. Chronic  wound on the left lower extremity.  Psychiatric:  Patient able to follow commands    Data Reviewed: Basic Metabolic Panel:  Recent Labs Lab 06/14/15 1731 06/14/15 2201 06/15/15 0156 06/15/15 0157 06/15/15 0607 06/15/15 1000 06/15/15 1310  NA  --  139 140  --  140 138 139  K  --  4.2 4.3  --  4.1 3.9 3.8  CL  --  107 107  --  109 106 105  CO2  --  30 30  --  30 28 28   GLUCOSE  --  119* 119*  --  128* 129* 131*  BUN  --  27* 25*  --  26* 29* 26*  CREATININE  --  1.60* 1.41*  --  1.46* 1.51* 1.36*  CALCIUM  --  8.1* 8.2*  --  8.2* 8.1* 8.2*  MG 1.6* 2.3  --  2.0 1.9 1.8  --   PHOS  --  1.9* 1.8*  --  1.8* 1.8* 1.6*   Liver Function Tests:  Recent Labs Lab 06/10/15 1930 06/11/15 0229 06/12/15 0330  06/14/15 0543  06/14/15 2201 06/15/15 0156 06/15/15 0607 06/15/15 1000 06/15/15 1310  AST 1138* >2275* 2031*  --  280*  --   --   --   --   --   --   ALT 643* 2215*  1958*  --  3*  --   --   --   --   --   --   ALKPHOS 373* 379* 350*  --  187*  --   --   --   --   --   --   BILITOT 1.2 1.4* 0.8  --  0.5  --   --   --   --   --   --   PROT 5.8* 5.2* 5.3*  --  4.7*  --   --   --   --   --   --   ALBUMIN 2.6* 2.5* 2.4*  < > 1.9*  < > 2.1* 2.1* 1.9* 2.1* 2.0*  < > = values in this interval not displayed.  Recent Labs Lab 06/10/15 1930  LIPASE 46   CBC:  Recent Labs Lab 06/10/15 1930 06/11/15 0229 06/12/15 0330 06/13/15 0559 06/13/15 2057 06/14/15 0543  WBC 21.1* 26.4* 19.0* 27.4* 24.5* 26.7*  NEUTROABS 19.6*  --   --   --  22.7*  --   HGB 10.2* 10.9* 10.6* 8.7* 7.4* 7.5*  HCT 35.3 36.7 35.5 28.9* 24.8* 25.3*  MCV 69.1* 68.5* 68.3* 66.2* 65.3* 65.5*  PLT 219 211 178 152 124* 105*   Cardiac Enzymes:  Recent Labs Lab 06/10/15 1930 06/11/15 0229 06/11/15 1351 06/11/15 2252 06/12/15 0540  TROPONINI 0.76* 8.37* 17.69* 15.54* 12.20*   BNP (last 3 results)  Recent Labs  01/20/15 2043 02/01/15 1432 04/07/15 1610  BNP 1333.0* 1345.0* 2338.0*      CBG:  Recent Labs Lab 06/14/15 2014 06/15/15 0046 06/15/15 0346 06/15/15 0813 06/15/15 1159  GLUCAP 116* 112* 94 114* 161*    Recent Results (from the past 240 hour(s))  Urine culture     Status: None   Collection Time: 06/10/15  7:30 PM  Result Value Ref Range Status   Specimen Description URINE, RANDOM  Final   Special Requests NONE  Final   Culture NO GROWTH 2 DAYS  Final   Report Status 06/12/2015 FINAL  Final  Blood Culture (routine x 2)     Status: None (Preliminary result)   Collection Time: 06/10/15  8:10 PM  Result Value Ref Range Status   Specimen Description BLOOD RIGHT  Final   Special Requests BOTTLES DRAWN AEROBIC AND ANAEROBIC 5CC  Final   Culture  Setup Time   Final    GRAM POSITIVE COCCI AEROBIC BOTTLE ONLY CRITICAL RESULT CALLED TO, READ BACK BY AND VERIFIED WITH: RN Kathi Simpers 06/14/15 1358    Culture   Final    COAGULASE NEGATIVE STAPHYLOCOCCUS Results consistent with contamination.    Report Status PENDING  Incomplete  Blood Culture (routine x 2)     Status: None   Collection Time: 06/10/15  8:20 PM  Result Value Ref Range Status   Specimen Description BLOOD RIGHT  Final   Special Requests BOTTLES DRAWN AEROBIC AND ANAEROBIC 2CC AERO, 4CC  Final   Culture NO GROWTH 5 DAYS  Final   Report Status 06/15/2015 FINAL  Final  MRSA PCR Screening     Status: Abnormal   Collection Time: 06/11/15  1:40 AM  Result Value Ref Range Status   MRSA by PCR POSITIVE (A) NEGATIVE Final    Comment:        The GeneXpert MRSA Assay (FDA approved for NASAL specimens only), is one component of a comprehensive MRSA colonization surveillance program. It is not intended to diagnose MRSA infection nor to guide or  monitor treatment for MRSA infections. CRITICAL RESULT CALLED TO, READ BACK BY AND VERIFIED WITH: [facility] ALLEN AT [id number] 06/11/15.PMH   Culture, sputum-assessment     Status: None   Collection Time: 06/11/15  4:30 AM  Result Value Ref  Range Status   Specimen Description TRACHEAL ASPIRATE  Final   Special Requests NONE  Final   Sputum evaluation THIS SPECIMEN IS ACCEPTABLE FOR SPUTUM CULTURE  Final   Report Status 06/11/2015 FINAL  Final  Culture, respiratory (NON-Expectorated)     Status: None   Collection Time: 06/11/15  4:30 AM  Result Value Ref Range Status   Specimen Description TRACHEAL ASPIRATE  Final   Special Requests NONE Reflexed from [id number]  Final   Gram Stain   Final    GOOD SPECIMEN - 80-90% WBCS MODERATE WBC SEEN MODERATE GRAM POSITIVE COCCI RARE GRAM NEGATIVE RODS    Culture HEAVY GROWTH STREPTOCOCCUS PNEUMONIAE  Final   Report Status 06/13/2015 FINAL  Final   Organism ID, Bacteria STREPTOCOCCUS PNEUMONIAE  Final      Susceptibility   Streptococcus pneumoniae - MIC*    ERYTHROMYCIN <=0.12 SENSITIVE Sensitive     VANCOMYCIN 0.5 SENSITIVE Sensitive     TRIMETH/SULFA <=10 SENSITIVE Sensitive     CLINDAMYCIN <=0.25 SENSITIVE Sensitive     LEVOFLOXACIN Value in next row Sensitive      SENSITIVE1    LINEZOLID Value in next row Sensitive      SENSITIVE<=2    * HEAVY GROWTH STREPTOCOCCUS PNEUMONIAE     Scheduled Meds: . antiseptic oral rinse  7 mL Mouth Rinse QID  . aspirin  81 mg Per Tube Daily  . budesonide  0.25 mg Nebulization BID  . cefTRIAXone (ROCEPHIN)  IV  2 g Intravenous Q12H  . chlorhexidine gluconate  15 mL Mouth Rinse BID  . Chlorhexidine Gluconate Cloth  6 each Topical Q0600  . free water  30 mL Per Tube 6 times per day  . heparin subcutaneous  5,000 Units Subcutaneous Q12H  . hydrocortisone sod succinate (SOLU-CORTEF) inj  50 mg Intravenous Q12H  . insulin aspart  0-15 Units Subcutaneous 6 times per day  . ipratropium-albuterol  3 mL Nebulization Q6H  . pantoprazole (PROTONIX) IV  40 mg Intravenous Q24H  . polyethylene glycol  17 g Oral Daily  . potassium phosphate IVPB (mmol)  10 mmol Intravenous Once  . senna-docusate  1 tablet Oral BID  . sterile water (preservative free)        Continuous Infusions: . feeding supplement (VITAL HIGH PROTEIN) 1,000 mL (06/15/15 1200)  . fentaNYL infusion INTRAVENOUS 200 mcg/hr (06/15/15 1321)  . norepinephrine Stopped (06/14/15 2230)  . pureflow 2,500 mL/hr at 06/15/15 1257    Assessment/Plan:  1. Septic shock with multiorgan failure. Overall prognosis is poor. High mortality. Patient is a full code. Solu-Cortef to 50 mg IV every 12 hours.  2. Sepsis with pneumonia right lung on first x-ray. Sputum culture grew out Streptococcus pneumoniae. Patient initially placed on triple antibiotics. Continue Rocephin at this point. 3. Acute respiratory failure with hypoxia- continue full ventilatory support at this time, FiO2 28%. With history of end-stage COPD it may be difficult to get her off the ventilator. Failed a weaning trial today 4. Shock liver with coagulopathy- severely elevated LFTs. Supportive care. Patient was given vitamin K for a few days with improvement in INR 5. Acute myocardial infarction- likely secondary to septic shock. Continue aspirin. 6. Acute renal failure and oligoria- continue CRRT, patient starting to put  out a little bit a urine. 7. Chronic pain on numerous outpatient medications- on fentanyl drip for sedation 8. Acute encephalopathy likely secondary to septic shock. Patient was following commands today 9. History of skin cancer status post plastic surgery procedure. 10. Nutrition- tube feeding as per dietary 11. Anemia likely of chronic disease and also dilutional from all the IV fluids that were given here. Patient may end up needing a blood transfusion during the hospital course. 12. Hypothermia - was on warming blanket yesterday  Code Status:     Code Status Orders        Start     Ordered   06/11/15 0142  Full code   Continuous     06/11/15 0141     Family Communication: Daughter at bedside today. Disposition Plan: To be determined  Consultants:  Critical care  specialist  Cardiology  Nephrology  Palliative care  Time spent: 31 minutes critical care time. Case discussed nursing staff and daughter at the bedside. Case also discussed with palliative care team.   Loletha Grayer  [facility] Hospitalists

## 2015-06-15 NOTE — Progress Notes (Signed)
PULMONARY / CRITICAL CARE MEDICINE   Name: Lynn Butler MRN: [id number] DOB: [birth year]    ADMISSION DATE:  06/10/2015 CONSULTATION DATE:  11/12  HPI:   Started on CRRT, clooted off this AM, plan fro SAT/SBT today  remains critically ill, prognosis is poor  MAJOR EVENTS/TEST RESULTS: 11/11 CTAP: NAD. Severe atherosclerosis 11/11 CT head: NAD 11/12 CT head: NAD 11/13 Worsening renal function. Renal consultation.  Deemed poor candidate for HD/CRRT of any duration. Goals of care discussed with pt's daughter 11/14-may start CRRT, vasc cath may be needed  INDWELLING DEVICES:: ETT 11/11 >>  L IJ CVL 11/11 >> RT femoral vasc cath>>11/14  MICRO DATA: Urine 11/11 >> NEG Blood 11/11 >>  MRSA PCR 11/12 >> POS Resp 11/12 >>     ANTIMICROBIALS: Vanc 11/11 >>  Pip-tazo 11/11 >>    SUBJECTIVE:  Fentanyl gtt resumed 11/12 PM. RASS -3,  Not F/C. No distress Vent setting noted, on vasopressors, has metabolic acidosis, on crrt Plan for SAT/SBt today  VITAL SIGNS: Temp:  [97 F (36.1 C)-98.8 F (37.1 C)] 98.2 F (36.8 C) (11/16 0800) Pulse Rate:  [65-91] 75 (11/16 0800) Resp:  [18-26] 26 (11/16 0800) BP: (84-143)/(42-115) 126/68 mmHg (11/16 0800) SpO2:  [95 %-100 %] 100 % (11/16 0800) FiO2 (%):  [28 %] 28 % (11/16 0448) Weight:  [132 lb 4.4 oz (60 kg)] 132 lb 4.4 oz (60 kg) (11/16 0540) HEMODYNAMICS:   VENTILATOR SETTINGS: Vent Mode:  [-] PRVC FiO2 (%):  [28 %] 28 % Set Rate:  [24 bmp] 24 bmp Vt Set:  [400 mL] 400 mL PEEP:  [5 cmH20] 5 cmH20 Plateau Pressure:  [11 cmH20] 11 cmH20 INTAKE / OUTPUT:  Intake/Output Summary (Last 24 hours) at 06/15/15 0830 Last data filed at 06/15/15 0800  Gross per 24 hour  Intake 2937.66 ml  Output     85 ml  Net 2852.66 ml    PHYSICAL EXAMINATION: General: RASS -3, frail appearing, appears much older than chronological age Neuro: PERRL, minimal withdrawal from pain, DTRs symmetric HEENT: NCAT, forehead lesion Cardiovascular:  reg, no M Lungs: no wheezes Abdomen: scaphoid, diminished BS Ext: severe atrophy, no edema GCS 8T  LABS:  CBC  Recent Labs Lab 06/13/15 0559 06/13/15 2057 06/14/15 0543  WBC 27.4* 24.5* 26.7*  HGB 8.7* 7.4* 7.5*  HCT 28.9* 24.8* 25.3*  PLT 152 124* 105*   Coag's  Recent Labs Lab 06/10/15 1930 06/11/15 1351 06/12/15 1101 06/13/15 2057 06/14/15 0543  APTT 41* 30 29  --   --   INR 2.26 1.93  --  1.45 1.28   BMET  Recent Labs Lab 06/14/15 2201 06/15/15 0156 06/15/15 0607  NA 139 140 140  K 4.2 4.3 4.1  CL 107 107 109  CO2 30 30 30   BUN 27* 25* 26*  CREATININE 1.60* 1.41* 1.46*  GLUCOSE 119* 119* 128*   Electrolytes  Recent Labs Lab 06/14/15 2201 06/15/15 0156 06/15/15 0157 06/15/15 0607  CALCIUM 8.1* 8.2*  --  8.2*  MG 2.3  --  2.0 1.9  PHOS 1.9* 1.8*  --  1.8*   Sepsis Markers  Recent Labs Lab 06/10/15 1930 06/11/15 0229 06/12/15 0330  LATICACIDVEN 6.7* 1.8  --   PROCALCITON  --   --  32.06   ABG  Recent Labs Lab 06/11/15 0600 06/13/15 1140 06/14/15 0825  PHART 7.10* 7.29* 7.33*  PCO2ART 61* 49* 58*  PO2ART 141* 64* 48*   Liver Enzymes  Recent Labs Lab 06/11/15 0229 06/12/15  0330  06/14/15 0543  06/14/15 2201 06/15/15 0156 06/15/15 0607  AST >2275* 2031*  --  280*  --   --   --   --   ALT 2215* 1958*  --  819*  --   --   --   --   ALKPHOS 379* 350*  --  187*  --   --   --   --   BILITOT 1.4* 0.8  --  0.5  --   --   --   --   ALBUMIN 2.5* 2.4*  < > 1.9*  < > 2.1* 2.1* 1.9*  < > = values in this interval not displayed. Cardiac Enzymes  Recent Labs Lab 06/11/15 1351 06/11/15 2252 06/12/15 0540  TROPONINI 17.69* 15.54* 12.20*   Glucose  Recent Labs Lab 06/14/15 0738 06/14/15 1134 06/14/15 1617 06/14/15 2014 06/15/15 0046 06/15/15 0346  GLUCAP 119* 104* 111* 116* 112* 94    CXR: CM, no edema or infiltrates   ASSESSMENT / PLAN:  69 yo female with acute resp failure and acute multiorgan  failure(resp,cardiovascualr,renal,liver) from acute strep  pneumonia and cardiogenic/septic shock With COPD  PULMONARY A: Acute resp failure Chronic COPD without acute bronchospasm P:   Cont full vent support - settings reviewed and/or adjusted-plan for SAT/SBT today Cont vent bundle -follow up ABG and CXR as needed  CARDIOVASCULAR A:  Acute MI-cardiogenic shock -vasopressors as needed CAD - 2VD deemed to not be amenable to revascularization PVD P:  MAP goal > 65 mmHg Cards consultation: no role for cardiac intervention due to MODS, poor baseline -follow up cardiology recs  RENAL A:  AKI Mild metabolic acidosis Mild hyperkalemia Very poor candidate for HD of any duration-however, patient' daughter wants dialysis P:   Monitor BMET intermittently Monitor I/Os Correct electrolytes as indicated HCO3 gtt initiated 11/13 Renal consultation 11/13-s/p HD vasc cath-started on CRRT-clotted this AM-will restart when able   GASTROINTESTINAL A:   No issues P:   SUP: IV PPI Continue TFs   HEMATOLOGIC A:   Mild anemia without overt bleeding P:  DVT px: SQ heparin Monitor CBC intermittently Transfuse per usual guidelines  INFECTIOUS Strep pneumonia P:   Monitor temp, WBC count   ENDOCRINE A:   Hyperglycemia without documented DM H/O frequent steroid dosing - suspect adrenal insuff P:   Cont SSI Cont stress dose steroids  NEUROLOGIC A:   Severe acute encephalopathy Severe baseline debilitation P:   RASS goal: -1, -2 Cont to address goals of care.    I have personally obtained a history, examined the patient, evaluated Pertinent laboratory and [employer]/imaging results, and  formulated the assessment and plan The Patient requires high complexity decision making for assessment and support, frequent evaluation and titration of therapies, application of advanced monitoring technologies and extensive interpretation of multiple databases. Critical Care Time  devoted to patient care services described in this note is 40 minutes.   Overall, patient is critically ill, prognosis is guarded.  Patient with Multiorgan failure and at high risk for cardiac arrest and death. Will obtain palliative care consult   Corrin Parker, M.D.  [facility]  Medical Director [place] Director [place] Department       06/15/2015, 8:30 AM

## 2015-06-15 NOTE — Progress Notes (Signed)
PHARMACY - Consult for CRRT dosing and monitoring  Pharmacy Consult for CRRT dosing and monitoring     Allergies  Allergen Reactions  . Nsaids Nausea And Vomiting and Other (See Comments)    Reaction:  GI distress and burning     Patient Measurements: Height: 5\' 2"  (157.5 cm) Weight: 132 lb 4.4 oz (60 kg) IBW/kg (Calculated) : 50.1  Vital Signs: Temp: 98.2 F (36.8 C) (11/16 1000) Temp Source: Rectal (11/16 0800) BP: 149/60 mmHg (11/16 1000) Pulse Rate: 90 (11/16 1000) Intake/Output from previous day: 11/15 0701 - 11/16 0700 In: 2852.7 [I.V.:1007.6; NG/GT:1495.1; IV Piggyback:350] Out: 85 [Urine:85] Total I/O In: 631.9 [I.V.:92.7; NG/GT:239.3; IV Piggyback:300] Out: -  Vent settings for last 24 hours: Vent Mode:  [-] Spontaneous FiO2 (%):  [28 %] 28 % Set Rate:  [0 bmp-24 bmp] 0 bmp Vt Set:  [400 mL] 400 mL PEEP:  [3 cmH20-5 cmH20] 3 cmH20 Pressure Support:  [5 cmH20] 5 cmH20 Plateau Pressure:  [11 cmH20] 11 cmH20  Labs:  Recent Labs  06/13/15 0559  06/13/15 2057  06/14/15 0543  06/14/15 2201 06/15/15 0156 06/15/15 0157 06/15/15 0607  WBC 27.4*  --  24.5*  --  26.7*  --   --   --   --   --   HGB 8.7*  --  7.4*  --  7.5*  --   --   --   --   --   HCT 28.9*  --  24.8*  --  25.3*  --   --   --   --   --   PLT 152  --  124*  --  105*  --   --   --   --   --   INR  --   --  1.45  --  1.28  --   --   --   --   --   CREATININE 4.42*  < >  --   < > 3.16*  < > 1.60* 1.41*  --  1.46*  MG  --   --  1.8  < > 1.7  < > 2.3  --  2.0 1.9  PHOS  --   < > 5.9*  < > 3.8  < > 1.9* 1.8*  --  1.8*  ALBUMIN  --   < >  --   < > 1.9*  < > 2.1* 2.1*  --  1.9*  PROT  --   --   --   --  4.7*  --   --   --   --   --   AST  --   --   --   --  280*  --   --   --   --   --   ALT  --   --   --   --  819*  --   --   --   --   --   ALKPHOS  --   --   --   --  187*  --   --   --   --   --   BILITOT  --   --   --   --  0.5  --   --   --   --   --   < > = values in this interval not  displayed. Estimated Creatinine Clearance: 29.2 mL/min (by C-G formula based on Cr of 1.46).   Recent Labs  06/14/15 2014 06/15/15  0046 06/15/15 0346  GLUCAP 116* 112* 109    Microbiology: Recent Results (from the past 720 hour(s))  Culture, blood (routine x 2)     Status: None   Collection Time: 05/21/15 10:49 PM  Result Value Ref Range Status   Specimen Description BLOOD RIGHT HAND  Final   Special Requests BOTTLES DRAWN AEROBIC AND ANAEROBIC 4CC  Final   Culture NO GROWTH 5 DAYS  Final   Report Status 05/26/2015 FINAL  Final  Culture, blood (routine x 2)     Status: None   Collection Time: 05/21/15 11:00 PM  Result Value Ref Range Status   Specimen Description BLOOD LEFT ASSIST CONTROL  Final   Special Requests BOTTLES DRAWN AEROBIC AND ANAEROBIC 4CC  Final   Culture NO GROWTH 5 DAYS  Final   Report Status 05/26/2015 FINAL  Final  MRSA PCR Screening     Status: None   Collection Time: 05/22/15  1:46 AM  Result Value Ref Range Status   MRSA by PCR NEGATIVE NEGATIVE Final    Comment:        The GeneXpert MRSA Assay (FDA approved for NASAL specimens only), is one component of a comprehensive MRSA colonization surveillance program. It is not intended to diagnose MRSA infection nor to guide or monitor treatment for MRSA infections.   Urine culture     Status: None   Collection Time: 06/10/15  7:30 PM  Result Value Ref Range Status   Specimen Description URINE, RANDOM  Final   Special Requests NONE  Final   Culture NO GROWTH 2 DAYS  Final   Report Status 06/12/2015 FINAL  Final  Blood Culture (routine x 2)     Status: None (Preliminary result)   Collection Time: 06/10/15  8:10 PM  Result Value Ref Range Status   Specimen Description BLOOD RIGHT  Final   Special Requests BOTTLES DRAWN AEROBIC AND ANAEROBIC 5CC  Final   Culture  Setup Time   Final    GRAM POSITIVE COCCI AEROBIC BOTTLE ONLY CRITICAL RESULT CALLED TO, READ BACK BY AND VERIFIED WITH: RN Kathi Simpers 06/14/15 1358    Culture   Final    COAGULASE NEGATIVE STAPHYLOCOCCUS Results consistent with contamination.    Report Status PENDING  Incomplete  Blood Culture (routine x 2)     Status: None   Collection Time: 06/10/15  8:20 PM  Result Value Ref Range Status   Specimen Description BLOOD RIGHT  Final   Special Requests BOTTLES DRAWN AEROBIC AND ANAEROBIC 2CC AERO, 4CC  Final   Culture NO GROWTH 5 DAYS  Final   Report Status 06/15/2015 FINAL  Final  MRSA PCR Screening     Status: Abnormal   Collection Time: 06/11/15  1:40 AM  Result Value Ref Range Status   MRSA by PCR POSITIVE (A) NEGATIVE Final    Comment:        The GeneXpert MRSA Assay (FDA approved for NASAL specimens only), is one component of a comprehensive MRSA colonization surveillance program. It is not intended to diagnose MRSA infection nor to guide or monitor treatment for MRSA infections. CRITICAL RESULT CALLED TO, READ BACK BY AND VERIFIED WITH: [facility] ALLEN AT [id number] 06/11/15.PMH   Culture, sputum-assessment     Status: None   Collection Time: 06/11/15  4:30 AM  Result Value Ref Range Status   Specimen Description TRACHEAL ASPIRATE  Final   Special Requests NONE  Final   Sputum evaluation THIS SPECIMEN IS ACCEPTABLE FOR SPUTUM CULTURE  Final   Report Status 06/11/2015 FINAL  Final  Culture, respiratory (NON-Expectorated)     Status: None   Collection Time: 06/11/15  4:30 AM  Result Value Ref Range Status   Specimen Description TRACHEAL ASPIRATE  Final   Special Requests NONE Reflexed from [id number]  Final   Gram Stain   Final    GOOD SPECIMEN - 80-90% WBCS MODERATE WBC SEEN MODERATE GRAM POSITIVE COCCI RARE GRAM NEGATIVE RODS    Culture HEAVY GROWTH STREPTOCOCCUS PNEUMONIAE  Final   Report Status 06/13/2015 FINAL  Final   Organism ID, Bacteria STREPTOCOCCUS PNEUMONIAE  Final      Susceptibility   Streptococcus pneumoniae - MIC*    ERYTHROMYCIN <=0.12 SENSITIVE Sensitive     VANCOMYCIN  0.5 SENSITIVE Sensitive     TRIMETH/SULFA <=10 SENSITIVE Sensitive     CLINDAMYCIN <=0.25 SENSITIVE Sensitive     LEVOFLOXACIN Value in next row Sensitive      SENSITIVE1    LINEZOLID Value in next row Sensitive      SENSITIVE<=2    * HEAVY GROWTH STREPTOCOCCUS PNEUMONIAE    Assessment: No medications require adjustment for CRRT at present.   Plan:  Pharmacy will continue to monitor patient medications and make adjustments as need for appropriate CRRT dosing.   Ulice Dash, PharmD Clinical Pharmacist  Pharmacy Resident

## 2015-06-15 NOTE — Progress Notes (Signed)
[facility]  ROUNDING NOTE   Subjective:  Pt still critically ill. Access clotted this AM but activase used.  Tolerating CRRT well otherwise.    Objective:  Vital signs in last 24 hours:  Temp:  [97.3 F (36.3 C)-98.8 F (37.1 C)] 98.2 F (36.8 C) (11/16 0900) Pulse Rate:  [65-114] 114 (11/16 0900) Resp:  [18-26] 20 (11/16 0900) BP: (84-164)/(42-115) 164/83 mmHg (11/16 0900) SpO2:  [95 %-100 %] 100 % (11/16 0900) FiO2 (%):  [28 %] 28 % (11/16 0832) Weight:  [60 kg (132 lb 4.4 oz)] 60 kg (132 lb 4.4 oz) (11/16 0540)  Weight change: -0.6 kg (-1 lb 5.2 oz) Filed Weights   06/13/15 0425 06/14/15 0419 06/15/15 0540  Weight: 54.9 kg (121 lb 0.5 oz) 60.6 kg (133 lb 9.6 oz) 60 kg (132 lb 4.4 oz)    Intake/Output: I/O last 3 completed shifts: In: 5819.5 [I.V.:3724.4; NG/GT:1495.1; IV [id number] Out: 235 [Urine:235]   Intake/Output this shift:  Total I/O In: 212.7 [I.V.:92.7; NG/GT:120] Out: -   Physical Exam: General: Critically ill appearing  Head: Healing wound on forehead ETT in place  Eyes: Eyes open  Neck: Supple, trachea midline  Lungs:  Bilateral rhonchi, vent assisted  Heart: S1S2 no rubs  Abdomen:  Soft, nontender, BS present   Extremities:  trace peripheral edema.  Neurologic: Did follow simple commands  Skin: Large forehead wound that is healing       Basic Metabolic Panel:  Recent Labs Lab 06/14/15 0949 06/14/15 1500 06/14/15 1731 06/14/15 2201 06/15/15 0156 06/15/15 0157 06/15/15 0607  NA 136 135  --  139 140  --  140  K 3.9 3.9  --  4.2 4.3  --  4.1  CL 102 102  --  107 107  --  109  CO2 28 29  --  30 30  --  30  GLUCOSE 127* 142*  --  119* 119*  --  128*  BUN 52* 32*  --  27* 25*  --  26*  CREATININE 3.07* 1.98*  --  1.60* 1.41*  --  1.46*  CALCIUM 7.6* 7.8*  --  8.1* 8.2*  --  8.2*  MG 1.7 1.6* 1.6* 2.3  --  2.0 1.9  PHOS 3.9 1.9*  --  1.9* 1.8*  --  1.8*    Liver Function Tests:  Recent Labs Lab 06/10/15 1930  06/11/15 0229 06/12/15 0330  06/14/15 0543 06/14/15 0949 06/14/15 1500 06/14/15 2201 06/15/15 0156 06/15/15 0607  AST 1138* >2275* 2031*  --  280*  --   --   --   --   --   ALT 643* 2215* 1958*  --  819*  --   --   --   --   --   ALKPHOS 373* 379* 350*  --  187*  --   --   --   --   --   BILITOT 1.2 1.4* 0.8  --  0.5  --   --   --   --   --   PROT 5.8* 5.2* 5.3*  --  4.7*  --   --   --   --   --   ALBUMIN 2.6* 2.5* 2.4*  < > 1.9* 1.9* 1.9* 2.1* 2.1* 1.9*  < > = values in this interval not displayed.  Recent Labs Lab 06/10/15 1930  LIPASE 46   No results for input(s): AMMONIA in the last 168 hours.  CBC:  Recent Labs Lab 06/10/15  1930 06/11/15 0229 06/12/15 0330 06/13/15 0559 06/13/15 2057 06/14/15 0543  WBC 21.1* 26.4* 19.0* 27.4* 24.5* 26.7*  NEUTROABS 19.6*  --   --   --  22.7*  --   HGB 10.2* 10.9* 10.6* 8.7* 7.4* 7.5*  HCT 35.3 36.7 35.5 28.9* 24.8* 25.3*  MCV 69.1* 68.5* 68.3* 66.2* 65.3* 65.5*  PLT 219 211 178 152 124* 105*    Cardiac Enzymes:  Recent Labs Lab 06/10/15 1930 06/11/15 0229 06/11/15 1351 06/11/15 2252 06/12/15 0540  TROPONINI 0.76* 8.37* 17.69* 15.54* 12.20*    BNP: Invalid input(s): POCBNP  CBG:  Recent Labs Lab 06/14/15 1134 06/14/15 1617 06/14/15 2014 06/15/15 0046 06/15/15 0346  GLUCAP 104* 111* 116* 112* 71    Microbiology: Results for orders placed or performed during the hospital encounter of 06/10/15  Urine culture     Status: None   Collection Time: 06/10/15  7:30 PM  Result Value Ref Range Status   Specimen Description URINE, RANDOM  Final   Special Requests NONE  Final   Culture NO GROWTH 2 DAYS  Final   Report Status 06/12/2015 FINAL  Final  Blood Culture (routine x 2)     Status: None (Preliminary result)   Collection Time: 06/10/15  8:10 PM  Result Value Ref Range Status   Specimen Description BLOOD RIGHT  Final   Special Requests BOTTLES DRAWN AEROBIC AND ANAEROBIC 5CC  Final   Culture  Setup Time    Final    GRAM POSITIVE COCCI AEROBIC BOTTLE ONLY CRITICAL RESULT CALLED TO, READ BACK BY AND VERIFIED WITH: RN Kathi Simpers 06/14/15 1358    Culture   Final    COAGULASE NEGATIVE STAPHYLOCOCCUS Results consistent with contamination.    Report Status PENDING  Incomplete  Blood Culture (routine x 2)     Status: None (Preliminary result)   Collection Time: 06/10/15  8:20 PM  Result Value Ref Range Status   Specimen Description BLOOD RIGHT  Final   Special Requests BOTTLES DRAWN AEROBIC AND ANAEROBIC 2CC AERO, 4CC  Final   Culture NO GROWTH 4 DAYS  Final   Report Status PENDING  Incomplete  MRSA PCR Screening     Status: Abnormal   Collection Time: 06/11/15  1:40 AM  Result Value Ref Range Status   MRSA by PCR POSITIVE (A) NEGATIVE Final    Comment:        The GeneXpert MRSA Assay (FDA approved for NASAL specimens only), is one component of a comprehensive MRSA colonization surveillance program. It is not intended to diagnose MRSA infection nor to guide or monitor treatment for MRSA infections. CRITICAL RESULT CALLED TO, READ BACK BY AND VERIFIED WITH: [facility] ALLEN AT [id number] 06/11/15.PMH   Culture, sputum-assessment     Status: None   Collection Time: 06/11/15  4:30 AM  Result Value Ref Range Status   Specimen Description TRACHEAL ASPIRATE  Final   Special Requests NONE  Final   Sputum evaluation THIS SPECIMEN IS ACCEPTABLE FOR SPUTUM CULTURE  Final   Report Status 06/11/2015 FINAL  Final  Culture, respiratory (NON-Expectorated)     Status: None   Collection Time: 06/11/15  4:30 AM  Result Value Ref Range Status   Specimen Description TRACHEAL ASPIRATE  Final   Special Requests NONE Reflexed from [id number]  Final   Gram Stain   Final    GOOD SPECIMEN - 80-90% WBCS MODERATE WBC SEEN MODERATE GRAM POSITIVE COCCI RARE GRAM NEGATIVE RODS    Culture HEAVY GROWTH STREPTOCOCCUS PNEUMONIAE  Final   Report Status 06/13/2015 FINAL  Final   Organism ID, Bacteria  STREPTOCOCCUS PNEUMONIAE  Final      Susceptibility   Streptococcus pneumoniae - MIC*    ERYTHROMYCIN <=0.12 SENSITIVE Sensitive     VANCOMYCIN 0.5 SENSITIVE Sensitive     TRIMETH/SULFA <=10 SENSITIVE Sensitive     CLINDAMYCIN <=0.25 SENSITIVE Sensitive     LEVOFLOXACIN Value in next row Sensitive      SENSITIVE1    LINEZOLID Value in next row Sensitive      SENSITIVE<=2    * HEAVY GROWTH STREPTOCOCCUS PNEUMONIAE    Coagulation Studies:  Recent Labs  06/13/15 2057 06/14/15 0543  LABPROT 17.7* 16.1*  INR 1.45 1.28    Urinalysis: No results for input(s): COLORURINE, LABSPEC, PHURINE, GLUCOSEU, HGBUR, BILIRUBINUR, KETONESUR, PROTEINUR, UROBILINOGEN, NITRITE, LEUKOCYTESUR in the last 72 hours.  Invalid input(s): APPERANCEUR    Imaging: No results found.   Medications:   . feeding supplement (VITAL HIGH PROTEIN) 1,000 mL (06/14/15 1423)  . fentaNYL infusion INTRAVENOUS Stopped (06/15/15 0830)  . norepinephrine Stopped (06/14/15 2230)  . pureflow 1,000 mL (06/15/15 0509)   . antiseptic oral rinse  7 mL Mouth Rinse QID  . aspirin  81 mg Per Tube Daily  . azithromycin  500 mg Intravenous Daily  . budesonide  0.25 mg Nebulization BID  . cefTRIAXone (ROCEPHIN)  IV  2 g Intravenous Q12H  . chlorhexidine gluconate  15 mL Mouth Rinse BID  . Chlorhexidine Gluconate Cloth  6 each Topical Q0600  . free water  30 mL Per Tube 6 times per day  . heparin subcutaneous  5,000 Units Subcutaneous Q12H  . hydrocortisone sod succinate (SOLU-CORTEF) inj  50 mg Intravenous Q12H  . insulin aspart  0-15 Units Subcutaneous 6 times per day  . ipratropium-albuterol  3 mL Nebulization Q6H  . mupirocin ointment  1 application Nasal BID  . pantoprazole (PROTONIX) IV  40 mg Intravenous Q24H  . senna-docusate  1 tablet Oral BID  . sterile water (preservative free)       acetaminophen **OR** acetaminophen, heparin, polyvinyl alcohol  Assessment/ Plan:  69 y.o. female  with a PMHx of COPD,  hypertension, ischemic cardiomyopathy with multiple coronary artery stents, history of ventricular tachycardia, peripheral vascular disease, hyperlipidemia, chronic back pain, history of congestive heart failure, who was admitted to [facility] on 06/10/2015 for evaluation of respiratory failure.   1. Acute renal failure secondary to acute tubular necrosis. Patient presented with multiorgan failure upon presentation. Baseline creatinine 0.75. -Pt seen at bedside, remains oliguric.  Continue CRRT, add UF rate of 50cc/hr.    2. Hyperkalemia. K currently 4.1, continue 4K bath.   3. Acute respiratory failure. Dr. Mortimer Fries attempting to wean pt off the ventilator.   4. Acute myocardial infarction.  Management per pulmonary critical care and hospitalist.    LOS: 5 Rodolphe Edmonston 11/16/20169:53 AM

## 2015-06-16 LAB — RENAL FUNCTION PANEL
ALBUMIN: 2.1 g/dL — AB (ref 3.5–5.0)
ALBUMIN: 2.1 g/dL — AB (ref 3.5–5.0)
ALBUMIN: 1.7 g/dL — AB (ref 3.5–5.0)
ALBUMIN: 1.9 g/dL — AB (ref 3.5–5.0)
ANION GAP: 5 (ref 5–15)
Albumin: 2 g/dL — ABNORMAL LOW (ref 3.5–5.0)
Anion gap: 5 (ref 5–15)
Anion gap: 1 — ABNORMAL LOW (ref 5–15)
Anion gap: 3 — ABNORMAL LOW (ref 5–15)
Anion gap: 3 — ABNORMAL LOW (ref 5–15)
BUN: 18 mg/dL (ref 6–20)
BUN: 18 mg/dL (ref 6–20)
BUN: 18 mg/dL (ref 6–20)
BUN: 19 mg/dL (ref 6–20)
BUN: 18 mg/dL (ref 6–20)
CALCIUM: 8.2 mg/dL — AB (ref 8.9–10.3)
CALCIUM: 7.7 mg/dL — AB (ref 8.9–10.3)
CALCIUM: 7.8 mg/dL — AB (ref 8.9–10.3)
CHLORIDE: 107 mmol/L (ref 101–111)
CO2: 29 mmol/L (ref 22–32)
CO2: 29 mmol/L (ref 22–32)
CO2: 30 mmol/L (ref 22–32)
CO2: 29 mmol/L (ref 22–32)
CO2: 28 mmol/L (ref 22–32)
CREATININE: 1.01 mg/dL — AB (ref 0.44–1.00)
CREATININE: 0.94 mg/dL (ref 0.44–1.00)
CREATININE: 0.98 mg/dL (ref 0.44–1.00)
CREATININE: 1.11 mg/dL — AB (ref 0.44–1.00)
Calcium: 8.4 mg/dL — ABNORMAL LOW (ref 8.9–10.3)
Calcium: 8.1 mg/dL — ABNORMAL LOW (ref 8.9–10.3)
Chloride: 105 mmol/L (ref 101–111)
Chloride: 106 mmol/L (ref 101–111)
Chloride: 109 mmol/L (ref 101–111)
Chloride: 108 mmol/L (ref 101–111)
Creatinine, Ser: 1.04 mg/dL — ABNORMAL HIGH (ref 0.44–1.00)
GFR calc Af Amer: >60 mL/min (ref >60)
GFR calc Af Amer: >60 mL/min (ref >60)
GFR calc Af Amer: >60 mL/min (ref >60)
GFR calc non Af Amer: 56 mL/min — ABNORMAL LOW (ref >60)
GFR calc non Af Amer: 54 mL/min — ABNORMAL LOW (ref >60)
GFR, EST AFRICAN AMERICAN: 58 mL/min — AB (ref >60)
GFR, EST NON AFRICAN AMERICAN: 58 mL/min — AB (ref >60)
GFR, EST NON AFRICAN AMERICAN: 50 mL/min — AB (ref >60)
GLUCOSE: 125 mg/dL — AB (ref 65–99)
GLUCOSE: 97 mg/dL (ref 65–99)
GLUCOSE: 120 mg/dL — AB (ref 65–99)
Glucose, Bld: 130 mg/dL — ABNORMAL HIGH (ref 65–99)
Glucose, Bld: 97 mg/dL (ref 65–99)
PHOSPHORUS: 1.5 mg/dL — AB (ref 2.5–4.6)
PHOSPHORUS: 1.4 mg/dL — AB (ref 2.5–4.6)
PHOSPHORUS: 2.1 mg/dL — AB (ref 2.5–4.6)
Phosphorus: 1.4 mg/dL — ABNORMAL LOW (ref 2.5–4.6)
Phosphorus: 1.6 mg/dL — ABNORMAL LOW (ref 2.5–4.6)
Potassium: 4.2 mmol/L (ref 3.5–5.1)
Potassium: 4.4 mmol/L (ref 3.5–5.1)
Potassium: 4 mmol/L (ref 3.5–5.1)
Potassium: 3.8 mmol/L (ref 3.5–5.1)
Potassium: 4.2 mmol/L (ref 3.5–5.1)
SODIUM: 139 mmol/L (ref 135–145)
SODIUM: 140 mmol/L (ref 135–145)
SODIUM: 141 mmol/L (ref 135–145)
SODIUM: 139 mmol/L (ref 135–145)
Sodium: 138 mmol/L (ref 135–145)

## 2015-06-16 LAB — CBC
HCT: 23.6 % — ABNORMAL LOW (ref 35.0–47.0)
Hemoglobin: 7.1 g/dL — ABNORMAL LOW (ref 12.0–16.0)
MCH: 20 pg — AB (ref 26.0–34.0)
MCHC: 30 g/dL — AB (ref 32.0–36.0)
MCV: 66.7 fL — AB (ref 80.0–100.0)
PLATELETS: 88 10*3/uL — AB (ref 150–440)
RBC: 3.54 MIL/uL — AB (ref 3.80–5.20)
RDW: 22.7 % — AB (ref 11.5–14.5)
WBC: 20.9 10*3/uL — ABNORMAL HIGH (ref 3.6–11.0)

## 2015-06-16 LAB — MAGNESIUM
MAGNESIUM: 1.6 mg/dL — AB (ref 1.7–2.4)
MAGNESIUM: 1.6 mg/dL — AB (ref 1.7–2.4)
Magnesium: 1.7 mg/dL (ref 1.7–2.4)
Magnesium: 1.9 mg/dL (ref 1.7–2.4)
Magnesium: 1.9 mg/dL (ref 1.7–2.4)

## 2015-06-16 LAB — CULTURE, BLOOD (ROUTINE X 2)

## 2015-06-16 LAB — HEPATIC FUNCTION PANEL
ALT: 407 U/L — ABNORMAL HIGH (ref 14–54)
AST: 78 U/L — ABNORMAL HIGH (ref 15–41)
Albumin: 2.1 g/dL — ABNORMAL LOW (ref 3.5–5.0)
Alkaline Phosphatase: 147 U/L — ABNORMAL HIGH (ref 38–126)
BILIRUBIN DIRECT: 0.1 mg/dL (ref 0.1–0.5)
BILIRUBIN INDIRECT: 0.5 mg/dL (ref 0.3–0.9)
TOTAL PROTEIN: 4.8 g/dL — AB (ref 6.5–8.1)
Total Bilirubin: 0.6 mg/dL (ref 0.3–1.2)

## 2015-06-16 LAB — GLUCOSE, CAPILLARY
GLUCOSE-CAPILLARY: 116 mg/dL — AB (ref 65–99)
GLUCOSE-CAPILLARY: 67 mg/dL (ref 65–99)
GLUCOSE-CAPILLARY: 72 mg/dL (ref 65–99)
Glucose-Capillary: 109 mg/dL — ABNORMAL HIGH (ref 65–99)
Glucose-Capillary: 83 mg/dL (ref 65–99)
Glucose-Capillary: 71 mg/dL (ref 65–99)

## 2015-06-16 MED ORDER — LORAZEPAM 2 MG/ML IJ SOLN
INTRAMUSCULAR | Status: AC
  Administered 2015-06-16: 4 mg via INTRAVENOUS
  Filled 2015-06-16: qty 2

## 2015-06-16 MED ORDER — SODIUM CHLORIDE 0.9 % IV SOLN
200.0000 mg | INTRAVENOUS | Status: AC
  Administered 2015-06-16 – 2015-06-18 (×3): 200 mg via INTRAVENOUS
  Filled 2015-06-16 (×3): qty 10

## 2015-06-16 MED ORDER — LORAZEPAM 2 MG/ML IJ SOLN
2.0000 mg | INTRAMUSCULAR | Status: DC | PRN
  Administered 2015-06-16 – 2015-06-18 (×8): 2 mg via INTRAVENOUS
  Filled 2015-06-16 (×8): qty 1

## 2015-06-16 MED ORDER — ALTEPLASE 2 MG IJ SOLR
2.0000 mg | Freq: Once | INTRAMUSCULAR | Status: AC
  Administered 2015-06-16: 2 mg
  Filled 2015-06-16: qty 2

## 2015-06-16 MED ORDER — STERILE WATER FOR INJECTION IJ SOLN
INTRAMUSCULAR | Status: AC
  Administered 2015-06-16: 10 mL
  Filled 2015-06-16: qty 10

## 2015-06-16 MED ORDER — POTASSIUM PHOSPHATES 15 MMOLE/5ML IV SOLN
10.0000 mmol | Freq: Once | INTRAVENOUS | Status: AC
  Administered 2015-06-16: 10 mmol via INTRAVENOUS
  Filled 2015-06-16: qty 3.33

## 2015-06-16 MED ORDER — HYDROMORPHONE HCL 2 MG PO TABS
4.0000 mg | ORAL_TABLET | Freq: Four times a day (QID) | ORAL | Status: DC | PRN
  Administered 2015-06-20 – 2015-06-23 (×3): 4 mg via ORAL
  Filled 2015-06-16 (×4): qty 2

## 2015-06-16 MED ORDER — DEXTROSE 5 % IV SOLN
20.0000 mmol | Freq: Once | INTRAVENOUS | Status: AC
  Administered 2015-06-16: 20 mmol via INTRAVENOUS
  Filled 2015-06-16: qty 6.67

## 2015-06-16 MED ORDER — LORAZEPAM 0.5 MG PO TABS
1.0000 mg | ORAL_TABLET | Freq: Four times a day (QID) | ORAL | Status: DC | PRN
  Administered 2015-06-16: 1 mg via ORAL
  Filled 2015-06-16: qty 2

## 2015-06-16 MED ORDER — MAGNESIUM SULFATE IN D5W 10-5 MG/ML-% IV SOLN
1.0000 g | Freq: Once | INTRAVENOUS | Status: AC
  Administered 2015-06-16: 1 g via INTRAVENOUS
  Filled 2015-06-16: qty 100

## 2015-06-16 MED ORDER — LORAZEPAM 2 MG/ML IJ SOLN
4.0000 mg | Freq: Once | INTRAMUSCULAR | Status: AC
  Administered 2015-06-16: 4 mg via INTRAVENOUS

## 2015-06-16 MED ORDER — FERROUS SULFATE 325 (65 FE) MG PO TABS
325.0000 mg | ORAL_TABLET | Freq: Two times a day (BID) | ORAL | Status: DC
  Administered 2015-06-16 – 2015-06-18 (×5): 325 mg via ORAL
  Filled 2015-06-16 (×5): qty 1

## 2015-06-16 MED ORDER — LORAZEPAM 2 MG/ML IJ SOLN: 2.0000 mg | INTRAMUSCULAR | Status: DC

## 2015-06-16 MED ORDER — POLYETHYLENE GLYCOL 3350 17 G PO PACK: 17.0000 g | PACK | Freq: Every day | ORAL | Status: DC | PRN

## 2015-06-16 NOTE — Progress Notes (Signed)
[facility]  ROUNDING NOTE   Subjective:  Dialysis access clotted again this AM. Activase being used again this AM. Otherwise progressing well with CRRT. Still on the vent. Remains oliguric at the moment.   Objective:  Vital signs in last 24 hours:  Temp:  [96.4 F (35.8 C)-98.8 F (37.1 C)] 98.8 F (37.1 C) (11/17 1000) Pulse Rate:  [76-108] 103 (11/17 1000) Resp:  [17-29] 26 (11/17 1000) BP: (126-162)/(49-83) 149/66 mmHg (11/17 1000) SpO2:  [100 %] 100 % (11/17 1000) FiO2 (%):  [28 %] 28 % (11/17 0700) Weight:  [58.1 kg (128 lb 1.4 oz)] 58.1 kg (128 lb 1.4 oz) (11/17 0500)  Weight change: -1.9 kg (-4 lb 3 oz) Filed Weights   06/14/15 0419 06/15/15 0540 06/16/15 0500  Weight: 60.6 kg (133 lb 9.6 oz) 60 kg (132 lb 4.4 oz) 58.1 kg (128 lb 1.4 oz)    Intake/Output: I/O last 3 completed shifts: In: 3635.7 [I.V.:1126.4; NG/GT:1859.3; IV [id number] Out: [id number] [Urine:200; Other:1060]   Intake/Output this shift:  Total I/O In: 375 [I.V.:75; NG/GT:90; IV Piggyback:210] Out: 75 [Other:75]  Physical Exam: General: Critically ill appearing  Head: Healing wound on forehead ETT in place  Eyes: Eyes open  Neck: Supple, trachea midline  Lungs:  Bilateral rhonchi, vent assisted  Heart: S1S2 no rubs  Abdomen:  Soft, nontender, BS present   Extremities:  trace peripheral edema.  Neurologic: Did follow simple commands  Skin: Large forehead wound that is healing       Basic Metabolic Panel:  Recent Labs Lab 06/15/15 1000 06/15/15 1310 06/15/15 1720 06/15/15 1724 06/15/15 2113 06/16/15 0112 06/16/15 0520  NA 138 139 138  --  139 139 140  K 3.9 3.8 4.0  --  4.2 4.2 4.4  CL 106 105 106  --  106 105 106  CO2 28 28 28   --  29 29 29   GLUCOSE 129* 131* 115*  --  116* 125* 130*  BUN 29* 26* 21*  --  19 18 18   CREATININE 1.51* 1.36* 1.22*  --  1.04* 1.01* 0.94  CALCIUM 8.1* 8.2* 8.2*  --  8.2* 8.2* 8.4*  MG 1.8  --   --  1.7 1.7 1.7 1.6*  PHOS 1.8* 1.6*  1.9*  --  1.8* 1.5* 1.4*    Liver Function Tests:  Recent Labs Lab 06/10/15 1930 06/11/15 0229 06/12/15 0330  06/14/15 0543  06/15/15 1310 06/15/15 1720 06/15/15 2113 06/16/15 0112 06/16/15 0520  AST 1138* >2275* 2031*  --  280*  --   --   --   --   --  78*  ALT 643* 2215* 1958*  --  819*  --   --   --   --   --  407*  ALKPHOS 373* 379* 350*  --  187*  --   --   --   --   --  147*  BILITOT 1.2 1.4* 0.8  --  0.5  --   --   --   --   --  0.6  PROT 5.8* 5.2* 5.3*  --  4.7*  --   --   --   --   --  4.8*  ALBUMIN 2.6* 2.5* 2.4*  < > 1.9*  < > 2.0* 2.0* 2.0* 2.1* 2.1*  2.1*  < > = values in this interval not displayed.  Recent Labs Lab 06/10/15 1930  LIPASE 46   No results for input(s): AMMONIA in the last 168 hours.  CBC:  Recent Labs Lab 06/10/15 1930  06/12/15 0330 06/13/15 0559 06/13/15 2057 06/14/15 0543 06/16/15 0520  WBC 21.1*  < > 19.0* 27.4* 24.5* 26.7* 20.9*  NEUTROABS 19.6*  --   --   --  22.7*  --   --   HGB 10.2*  < > 10.6* 8.7* 7.4* 7.5* 7.1*  HCT 35.3  < > 35.5 28.9* 24.8* 25.3* 23.6*  MCV 69.1*  < > 68.3* 66.2* 65.3* 65.5* 66.7*  PLT 219  < > 178 152 124* 105* 88*  < > = values in this interval not displayed.  Cardiac Enzymes:  Recent Labs Lab 06/10/15 1930 06/11/15 0229 06/11/15 1351 06/11/15 2252 06/12/15 0540  TROPONINI 0.76* 8.37* 17.69* 15.54* 12.20*    BNP: Invalid input(s): POCBNP  CBG:  Recent Labs Lab 06/15/15 1624 06/15/15 1958 06/15/15 2333 06/16/15 0338 06/16/15 0731  GLUCAP 80 67 84 109* 116*    Microbiology: Results for orders placed or performed during the hospital encounter of 06/10/15  Urine culture     Status: None   Collection Time: 06/10/15  7:30 PM  Result Value Ref Range Status   Specimen Description URINE, RANDOM  Final   Special Requests NONE  Final   Culture NO GROWTH 2 DAYS  Final   Report Status 06/12/2015 FINAL  Final  Blood Culture (routine x 2)     Status: None   Collection Time: 06/10/15   8:10 PM  Result Value Ref Range Status   Specimen Description BLOOD RIGHT  Final   Special Requests BOTTLES DRAWN AEROBIC AND ANAEROBIC 5CC  Final   Culture  Setup Time   Final    GRAM POSITIVE COCCI AEROBIC BOTTLE ONLY CRITICAL RESULT CALLED TO, READ BACK BY AND VERIFIED WITH: RN Kathi Simpers 06/14/15 1358    Culture   Final    COAGULASE NEGATIVE STAPHYLOCOCCUS Results consistent with contamination.    Report Status 06/16/2015 FINAL  Final  Blood Culture (routine x 2)     Status: None   Collection Time: 06/10/15  8:20 PM  Result Value Ref Range Status   Specimen Description BLOOD RIGHT  Final   Special Requests BOTTLES DRAWN AEROBIC AND ANAEROBIC 2CC AERO, 4CC  Final   Culture NO GROWTH 5 DAYS  Final   Report Status 06/15/2015 FINAL  Final  MRSA PCR Screening     Status: Abnormal   Collection Time: 06/11/15  1:40 AM  Result Value Ref Range Status   MRSA by PCR POSITIVE (A) NEGATIVE Final    Comment:        The GeneXpert MRSA Assay (FDA approved for NASAL specimens only), is one component of a comprehensive MRSA colonization surveillance program. It is not intended to diagnose MRSA infection nor to guide or monitor treatment for MRSA infections. CRITICAL RESULT CALLED TO, READ BACK BY AND VERIFIED WITH: [facility] ALLEN AT [id number] 06/11/15.PMH   Culture, sputum-assessment     Status: None   Collection Time: 06/11/15  4:30 AM  Result Value Ref Range Status   Specimen Description TRACHEAL ASPIRATE  Final   Special Requests NONE  Final   Sputum evaluation THIS SPECIMEN IS ACCEPTABLE FOR SPUTUM CULTURE  Final   Report Status 06/11/2015 FINAL  Final  Culture, respiratory (NON-Expectorated)     Status: None   Collection Time: 06/11/15  4:30 AM  Result Value Ref Range Status   Specimen Description TRACHEAL ASPIRATE  Final   Special Requests NONE Reflexed from [id number]  Final   Gram Stain  Final    GOOD SPECIMEN - 80-90% WBCS MODERATE WBC SEEN MODERATE GRAM POSITIVE  COCCI RARE GRAM NEGATIVE RODS    Culture HEAVY GROWTH STREPTOCOCCUS PNEUMONIAE  Final   Report Status 06/13/2015 FINAL  Final   Organism ID, Bacteria STREPTOCOCCUS PNEUMONIAE  Final      Susceptibility   Streptococcus pneumoniae - MIC*    ERYTHROMYCIN <=0.12 SENSITIVE Sensitive     VANCOMYCIN 0.5 SENSITIVE Sensitive     TRIMETH/SULFA <=10 SENSITIVE Sensitive     CLINDAMYCIN <=0.25 SENSITIVE Sensitive     LEVOFLOXACIN Value in next row Sensitive      SENSITIVE1    LINEZOLID Value in next row Sensitive      SENSITIVE<=2    * HEAVY GROWTH STREPTOCOCCUS PNEUMONIAE    Coagulation Studies:  Recent Labs  06/13/15 2057 06/14/15 0543  LABPROT 17.7* 16.1*  INR 1.45 1.28    Urinalysis: No results for input(s): COLORURINE, LABSPEC, PHURINE, GLUCOSEU, HGBUR, BILIRUBINUR, KETONESUR, PROTEINUR, UROBILINOGEN, NITRITE, LEUKOCYTESUR in the last 72 hours.  Invalid input(s): APPERANCEUR    Imaging: No results found.   Medications:   . feeding supplement (VITAL HIGH PROTEIN) Stopped (06/16/15 0758)  . fentaNYL infusion INTRAVENOUS 150 mcg/hr (06/16/15 0759)  . pureflow 3 each (06/16/15 0615)   . antiseptic oral rinse  7 mL Mouth Rinse QID  . aspirin  81 mg Per Tube Daily  . budesonide  0.25 mg Nebulization BID  . cefTRIAXone (ROCEPHIN)  IV  2 g Intravenous Q12H  . chlorhexidine gluconate  15 mL Mouth Rinse BID  . ferrous sulfate  325 mg Oral BID WC  . free water  30 mL Per Tube 6 times per day  . heparin subcutaneous  5,000 Units Subcutaneous Q12H  . insulin aspart  0-15 Units Subcutaneous 6 times per day  . ipratropium-albuterol  3 mL Nebulization Q6H  . iron sucrose  200 mg Intravenous Q24H  . pantoprazole (PROTONIX) IV  40 mg Intravenous Q24H  . senna-docusate  1 tablet Oral BID   acetaminophen **OR** acetaminophen, heparin, HYDROmorphone, LORazepam, polyethylene glycol, polyvinyl alcohol  Assessment/ Plan:  69 y.o. female  with a PMHx of COPD, hypertension, ischemic  cardiomyopathy with multiple coronary artery stents, history of ventricular tachycardia, peripheral vascular disease, hyperlipidemia, chronic back pain, history of congestive heart failure, who was admitted to [facility] on 06/10/2015 for evaluation of respiratory failure.   1. Acute renal failure secondary to acute tubular necrosis. Patient presented with multiorgan failure upon presentation. Baseline creatinine 0.75. -Pt progressing well on CRRT, catheter clotted again, activase used again, restart CRRT once patency of access achieved, continue current CRRT parameters, good UF noted yesterday.    2. Hyperkalemia. Resolved, K 4.4 this AM, continue 4K bath.  3. Acute respiratory failure. Remains on the ventilator at the moment, pulm/cc working towards extubation.  4. Acute myocardial infarction.  Management per pulmonary critical care and hospitalist.   5.  Anemia unspecified: Hgb currently 7.1, transfuse for hgb of 7 or less.    LOS: 6 Infiniti Hoefling 11/17/201610:13 AM

## 2015-06-16 NOTE — Progress Notes (Signed)
Dr. Holley Raring present and RN made MD aware that patient's venous access on dialysis catheter does not have blood return and is hard to flush and that RN was unable to perform a complete blood rinse back when stopping CRRT. MD acknowledged and gave order for cathflo to dialysis catheter.

## 2015-06-16 NOTE — Progress Notes (Signed)
[place] [place] NAME: Lynn Butler    MR#:  [id number]  DATE OF BIRTH:  [birth year]  SUBJECTIVE:  CHIEF COMPLAINT:   Chief Complaint  Patient presents with  . Respiratory Arrest   - admitted for septic shock, remains on event. Failed SBT yesterday due to desaturation and tachycardia - Patient on chronic home o2 due to COPD and ongoing smoking - alert and following some commands. Remains on CRRT, oliguric - on fentanyl drip for sedation  REVIEW OF SYSTEMS:  Review of Systems  Unable to perform ROS: critical illness    DRUG ALLERGIES:   Allergies  Allergen Reactions  . Nsaids Nausea And Vomiting and Other (See Comments)    Reaction:  GI distress and burning     VITALS:  Blood pressure 154/65, pulse 101, temperature 97.7 F (36.5 C), temperature source Core (Comment), resp. rate 27, height 5\' 2"  (1.575 m), weight 58.1 kg (128 lb 1.4 oz), SpO2 100 %.  PHYSICAL EXAMINATION:  Physical Exam  GENERAL:  69 y.o.-year-old patient lying in the bed with no acute distress. Awake and following commands. Some confusion at times. EYES: Pupils equal, round, reactive to light and accommodation. No scleral icterus. Extraocular muscles intact.  HEENT: Head atraumatic, normocephalic.  Forehead mass has been removed, s/p plastic surgery and skin graft in place. Oropharynx with ET tube.  NECK:  Supple, no jugular venous distention. No thyroid enlargement, no tenderness.  Triple lumen catheter on left ide of neck LUNGS: Normal breath sounds bilaterally, no wheezing, rales,rhonchi or crepitation today. No use of accessory muscles of respiration. Decreased bibasilar breath sounds. CARDIOVASCULAR: S1, S2 normal. No murmurs, rubs, or gallops.  ABDOMEN: Soft, nontender, nondistended. Bowel sounds present. No organomegaly or mass.  EXTREMITIES: No pedal edema, cyanosis, or clubbing. Feeble dorsalis pedis pulse palpable on right foot and  dopplerable on left foot.  Dialysis catheter in left groin NEUROLOGIC: no facial droop, following some simple commands Sleepy at times. Unable to do a complete neuro exam as patient on sedation  PSYCHIATRIC: The patient is alert and following some simple commands SKIN: forehead skin graft in place.    LABORATORY PANEL:   CBC  Recent Labs Lab 06/16/15 0520  WBC 20.9*  HGB 7.1*  HCT 23.6*  PLT 88*   ------------------------------------------------------------------------------------------------------------------  Chemistries   Recent Labs Lab 06/14/15 0543  06/16/15 0112 06/16/15 0520  NA 134*  < > 139 140  K 3.9  < > 4.2 4.4  CL 100*  < > 105 106  CO2 29  < > 29 29  GLUCOSE 131*  < > 125* 130*  BUN 54*  < > 18 18  CREATININE 3.16*  < > 1.01* 0.94  CALCIUM 7.6*  < > 8.2* 8.4*  MG 1.7  < > 1.7  --   AST 280*  --   --   --   ALT 819*  --   --   --   ALKPHOS 187*  --   --   --   BILITOT 0.5  --   --   --   < > = values in this interval not displayed. ------------------------------------------------------------------------------------------------------------------  Cardiac Enzymes  Recent Labs Lab 06/12/15 0540  TROPONINI 12.20*   ------------------------------------------------------------------------------------------------------------------  RADIOLOGY:  No results found.  EKG:   Orders placed or performed during the hospital encounter of 06/10/15  . EKG 12-Lead  . EKG 12-Lead  . EKG 12-Lead  . EKG 12-Lead  ASSESSMENT AND PLAN:   69y/oF with chronic COPD on home o2, multiple abdominal surgeries for obstruction, CAD, HTN, CHF, PAD admitted for septic shock  1. Septic shock- due to pneumonia - blood cultures negative, sputum cultures with streptococcus pneumoniae - on azithromycin and rocephin - discontinue stress dose steroids today  2. Acute respiratory failure- due to pneumonia \- known COPD on home o2, on minimal vent settings now - SBT  again today, appreciate pulm consult - cont inhalers  3. ARF- with oliguria, cr normalised, still with minimal urine output - on CRRT, appreciate nephrology consult  4. Elevated troponins- NSTEMI, known severe 2 vessel disease in past with no chance for revascularization - appreciate cards consult - received IV heparin for 48hrs last week - no active symptoms for now  5. Acute on chronic anemia- severe iron deficiency on albs - start IV iron for 3 days and oral iron supplements - transfuse if hb <7  6. Chronic pain syndrome- always in pain, follows with pain management clinic and on pain contract - on fentanyl drip for now - add oral prn meds, her oxycontin can be restarted after extubation as it cannot be crushed  7. Nutrition- on tube feeds  8. DVT Prophylaxis- SQ heparin for now    All the records are reviewed and case discussed with Care Management/Social Workerr. Management plans discussed with the patient, family and they are in agreement.  CODE STATUS: Full Code  TOTAL CRITICAL CARE TIME SPENT IN TAKING CARE OF THIS PATIENT: 45 minutes.   POSSIBLE D/C IN 3-4 DAYS, DEPENDING ON CLINICAL CONDITION.   Gladstone Lighter M.D on 06/16/2015 at 7:57 AM  Between 7am to 6pm - Pager - [phone]  After 6pm go to [email or web address] - password [email or web address] [email or web address]  [facility]  Office  [phone]  CC: Primary care physician; Juanell Fairly, MD

## 2015-06-16 NOTE — Progress Notes (Signed)
PHARMACY - Consult for CRRT dosing and monitoring  Pharmacy Consult for CRRT dosing and monitoring     Allergies  Allergen Reactions  . Nsaids Nausea And Vomiting and Other (See Comments)    Reaction:  GI distress and burning     Patient Measurements: Height: 5\' 2"  (157.5 cm) Weight: 128 lb 1.4 oz (58.1 kg) IBW/kg (Calculated) : 50.1  Vital Signs: Temp: 98.1 F (36.7 C) (11/17 0800) Temp Source: Core (Comment) (11/17 0400) BP: 127/55 mmHg (11/17 0800) Pulse Rate: 76 (11/17 0800) Intake/Output from previous day: 11/16 0701 - 11/17 0700 In: 2486.9 [I.V.:612.7; NG/GT:1274.3; IV Piggyback:600] Out: 1187 [Urine:175] Total I/O In: 45 [I.V.:15; NG/GT:30] Out: -  Vent settings for last 24 hours: Vent Mode:  [-] PRVC FiO2 (%):  [28 %] 28 % Set Rate:  [0 bmp-24 bmp] 24 bmp Vt Set:  [400 mL] 400 mL PEEP:  [3 cmH20-5 cmH20] 5 cmH20 Pressure Support:  [5 cmH20] 5 cmH20  Labs:  Recent Labs  06/13/15 2057  06/14/15 0543  06/15/15 1724 06/15/15 2113 06/16/15 0112 06/16/15 0520  WBC 24.5*  --  26.7*  --   --   --   --  20.9*  HGB 7.4*  --  7.5*  --   --   --   --  7.1*  HCT 24.8*  --  25.3*  --   --   --   --  23.6*  PLT 124*  --  105*  --   --   --   --  88*  INR 1.45  --  1.28  --   --   --   --   --   CREATININE  --   < > 3.16*  < >  --  1.04* 1.01* 0.94  MG 1.8  < > 1.7  < > 1.7 1.7 1.7  --   PHOS 5.9*  < > 3.8  < >  --  1.8* 1.5* 1.4*  ALBUMIN  --   < > 1.9*  < >  --  2.0* 2.1* 2.1*  PROT  --   --  4.7*  --   --   --   --   --   AST  --   --  280*  --   --   --   --   --   ALT  --   --  819*  --   --   --   --   --   ALKPHOS  --   --  187*  --   --   --   --   --   BILITOT  --   --  0.5  --   --   --   --   --   < > = values in this interval not displayed. Estimated Creatinine Clearance: 45.3 mL/min (by C-G formula based on Cr of 0.94).   Recent Labs  06/15/15 2333 06/16/15 0338 06/16/15 0731  GLUCAP 84 109* 116*    Microbiology: Recent Results (from the  past 720 hour(s))  Culture, blood (routine x 2)     Status: None   Collection Time: 05/21/15 10:49 PM  Result Value Ref Range Status   Specimen Description BLOOD RIGHT HAND  Final   Special Requests BOTTLES DRAWN AEROBIC AND ANAEROBIC 4CC  Final   Culture NO GROWTH 5 DAYS  Final   Report Status 05/26/2015 FINAL  Final  Culture, blood (routine x 2)     Status:  None   Collection Time: 05/21/15 11:00 PM  Result Value Ref Range Status   Specimen Description BLOOD LEFT ASSIST CONTROL  Final   Special Requests BOTTLES DRAWN AEROBIC AND ANAEROBIC 4CC  Final   Culture NO GROWTH 5 DAYS  Final   Report Status 05/26/2015 FINAL  Final  MRSA PCR Screening     Status: None   Collection Time: 05/22/15  1:46 AM  Result Value Ref Range Status   MRSA by PCR NEGATIVE NEGATIVE Final    Comment:        The GeneXpert MRSA Assay (FDA approved for NASAL specimens only), is one component of a comprehensive MRSA colonization surveillance program. It is not intended to diagnose MRSA infection nor to guide or monitor treatment for MRSA infections.   Urine culture     Status: None   Collection Time: 06/10/15  7:30 PM  Result Value Ref Range Status   Specimen Description URINE, RANDOM  Final   Special Requests NONE  Final   Culture NO GROWTH 2 DAYS  Final   Report Status 06/12/2015 FINAL  Final  Blood Culture (routine x 2)     Status: None   Collection Time: 06/10/15  8:10 PM  Result Value Ref Range Status   Specimen Description BLOOD RIGHT  Final   Special Requests BOTTLES DRAWN AEROBIC AND ANAEROBIC 5CC  Final   Culture  Setup Time   Final    GRAM POSITIVE COCCI AEROBIC BOTTLE ONLY CRITICAL RESULT CALLED TO, READ BACK BY AND VERIFIED WITH: RN Kathi Simpers 06/14/15 1358    Culture   Final    COAGULASE NEGATIVE STAPHYLOCOCCUS Results consistent with contamination.    Report Status 06/16/2015 FINAL  Final  Blood Culture (routine x 2)     Status: None   Collection Time: 06/10/15  8:20  PM  Result Value Ref Range Status   Specimen Description BLOOD RIGHT  Final   Special Requests BOTTLES DRAWN AEROBIC AND ANAEROBIC 2CC AERO, 4CC  Final   Culture NO GROWTH 5 DAYS  Final   Report Status 06/15/2015 FINAL  Final  MRSA PCR Screening     Status: Abnormal   Collection Time: 06/11/15  1:40 AM  Result Value Ref Range Status   MRSA by PCR POSITIVE (A) NEGATIVE Final    Comment:        The GeneXpert MRSA Assay (FDA approved for NASAL specimens only), is one component of a comprehensive MRSA colonization surveillance program. It is not intended to diagnose MRSA infection nor to guide or monitor treatment for MRSA infections. CRITICAL RESULT CALLED TO, READ BACK BY AND VERIFIED WITH: [facility] ALLEN AT [id number] 06/11/15.PMH   Culture, sputum-assessment     Status: None   Collection Time: 06/11/15  4:30 AM  Result Value Ref Range Status   Specimen Description TRACHEAL ASPIRATE  Final   Special Requests NONE  Final   Sputum evaluation THIS SPECIMEN IS ACCEPTABLE FOR SPUTUM CULTURE  Final   Report Status 06/11/2015 FINAL  Final  Culture, respiratory (NON-Expectorated)     Status: None   Collection Time: 06/11/15  4:30 AM  Result Value Ref Range Status   Specimen Description TRACHEAL ASPIRATE  Final   Special Requests NONE Reflexed from [id number]  Final   Gram Stain   Final    GOOD SPECIMEN - 80-90% WBCS MODERATE WBC SEEN MODERATE GRAM POSITIVE COCCI RARE GRAM NEGATIVE RODS    Culture HEAVY GROWTH STREPTOCOCCUS PNEUMONIAE  Final   Report Status 06/13/2015 FINAL  Final   Organism ID, Bacteria STREPTOCOCCUS PNEUMONIAE  Final      Susceptibility   Streptococcus pneumoniae - MIC*    ERYTHROMYCIN <=0.12 SENSITIVE Sensitive     VANCOMYCIN 0.5 SENSITIVE Sensitive     TRIMETH/SULFA <=10 SENSITIVE Sensitive     CLINDAMYCIN <=0.25 SENSITIVE Sensitive     LEVOFLOXACIN Value in next row Sensitive      SENSITIVE1    LINEZOLID Value in next row Sensitive      SENSITIVE<=2    * HEAVY  GROWTH STREPTOCOCCUS PNEUMONIAE    Assessment: No medications require adjustment for CRRT at present.   Plan:  Pharmacy will continue to monitor patient medications and make adjustments as need for appropriate CRRT dosing.   Nancy Fetter, PharmD Pharmacy Resident

## 2015-06-16 NOTE — Progress Notes (Signed)
Dr. Mortimer Fries present and talking with patient's daughter and patient anxious.  Dr. Mortimer Fries gave verbal order for 4mg  of ativan IV once now.

## 2015-06-16 NOTE — Progress Notes (Signed)
MEDICATION RELATED CONSULT NOTE - INITIAL   Pharmacy Consult for Constipation Prevention  Allergies  Allergen Reactions  . Nsaids Nausea And Vomiting and Other (See Comments)    Reaction:  GI distress and burning    Patient Measurements: Height: 5\' 2"  (157.5 cm) Weight: 128 lb 1.4 oz (58.1 kg) IBW/kg (Calculated) : 50.1  Vital Signs: Temp: 98.1 F (36.7 C) (11/17 0800) Temp Source: Core (Comment) (11/17 0400) BP: 127/55 mmHg (11/17 0800) Pulse Rate: 76 (11/17 0800) Intake/Output from previous day: 11/16 0701 - 11/17 0700 In: 2486.9 [I.V.:612.7; NG/GT:1274.3; IV Piggyback:600] Out: 1187 [Urine:175] Intake/Output from this shift: Total I/O In: 45 [I.V.:15; NG/GT:30] Out: -   Labs:  Recent Labs  06/13/15 2057  06/14/15 0543  06/15/15 1724 06/15/15 2113 06/16/15 0112 06/16/15 0520  WBC 24.5*  --  26.7*  --   --   --   --  20.9*  HGB 7.4*  --  7.5*  --   --   --   --  7.1*  HCT 24.8*  --  25.3*  --   --   --   --  23.6*  PLT 124*  --  105*  --   --   --   --  88*  CREATININE  --   < > 3.16*  < >  --  1.04* 1.01* 0.94  MG 1.8  < > 1.7  < > 1.7 1.7 1.7  --   PHOS 5.9*  < > 3.8  < >  --  1.8* 1.5* 1.4*  ALBUMIN  --   < > 1.9*  < >  --  2.0* 2.1* 2.1*  PROT  --   --  4.7*  --   --   --   --   --   AST  --   --  280*  --   --   --   --   --   ALT  --   --  819*  --   --   --   --   --   ALKPHOS  --   --  187*  --   --   --   --   --   BILITOT  --   --  0.5  --   --   --   --   --   < > = values in this interval not displayed. Estimated Creatinine Clearance: 45.3 mL/min (by C-G formula based on Cr of 0.94).   Microbiology: Recent Results (from the past 720 hour(s))  Culture, blood (routine x 2)     Status: None   Collection Time: 05/21/15 10:49 PM  Result Value Ref Range Status   Specimen Description BLOOD RIGHT HAND  Final   Special Requests BOTTLES DRAWN AEROBIC AND ANAEROBIC 4CC  Final   Culture NO GROWTH 5 DAYS  Final   Report Status 05/26/2015 FINAL  Final   Culture, blood (routine x 2)     Status: None   Collection Time: 05/21/15 11:00 PM  Result Value Ref Range Status   Specimen Description BLOOD LEFT ASSIST CONTROL  Final   Special Requests BOTTLES DRAWN AEROBIC AND ANAEROBIC 4CC  Final   Culture NO GROWTH 5 DAYS  Final   Report Status 05/26/2015 FINAL  Final  MRSA PCR Screening     Status: None   Collection Time: 05/22/15  1:46 AM  Result Value Ref Range Status   MRSA by PCR NEGATIVE NEGATIVE Final    Comment:  The GeneXpert MRSA Assay (FDA approved for NASAL specimens only), is one component of a comprehensive MRSA colonization surveillance program. It is not intended to diagnose MRSA infection nor to guide or monitor treatment for MRSA infections.   Urine culture     Status: None   Collection Time: 06/10/15  7:30 PM  Result Value Ref Range Status   Specimen Description URINE, RANDOM  Final   Special Requests NONE  Final   Culture NO GROWTH 2 DAYS  Final   Report Status 06/12/2015 FINAL  Final  Blood Culture (routine x 2)     Status: None   Collection Time: 06/10/15  8:10 PM  Result Value Ref Range Status   Specimen Description BLOOD RIGHT  Final   Special Requests BOTTLES DRAWN AEROBIC AND ANAEROBIC 5CC  Final   Culture  Setup Time   Final    GRAM POSITIVE COCCI AEROBIC BOTTLE ONLY CRITICAL RESULT CALLED TO, READ BACK BY AND VERIFIED WITH: RN Kathi Simpers 06/14/15 1358    Culture   Final    COAGULASE NEGATIVE STAPHYLOCOCCUS Results consistent with contamination.    Report Status 06/16/2015 FINAL  Final  Blood Culture (routine x 2)     Status: None   Collection Time: 06/10/15  8:20 PM  Result Value Ref Range Status   Specimen Description BLOOD RIGHT  Final   Special Requests BOTTLES DRAWN AEROBIC AND ANAEROBIC 2CC AERO, 4CC  Final   Culture NO GROWTH 5 DAYS  Final   Report Status 06/15/2015 FINAL  Final  MRSA PCR Screening     Status: Abnormal   Collection Time: 06/11/15  1:40 AM  Result Value  Ref Range Status   MRSA by PCR POSITIVE (A) NEGATIVE Final    Comment:        The GeneXpert MRSA Assay (FDA approved for NASAL specimens only), is one component of a comprehensive MRSA colonization surveillance program. It is not intended to diagnose MRSA infection nor to guide or monitor treatment for MRSA infections. CRITICAL RESULT CALLED TO, READ BACK BY AND VERIFIED WITH: [facility] ALLEN AT [id number] 06/11/15.PMH   Culture, sputum-assessment     Status: None   Collection Time: 06/11/15  4:30 AM  Result Value Ref Range Status   Specimen Description TRACHEAL ASPIRATE  Final   Special Requests NONE  Final   Sputum evaluation THIS SPECIMEN IS ACCEPTABLE FOR SPUTUM CULTURE  Final   Report Status 06/11/2015 FINAL  Final  Culture, respiratory (NON-Expectorated)     Status: None   Collection Time: 06/11/15  4:30 AM  Result Value Ref Range Status   Specimen Description TRACHEAL ASPIRATE  Final   Special Requests NONE Reflexed from [id number]  Final   Gram Stain   Final    GOOD SPECIMEN - 80-90% WBCS MODERATE WBC SEEN MODERATE GRAM POSITIVE COCCI RARE GRAM NEGATIVE RODS    Culture HEAVY GROWTH STREPTOCOCCUS PNEUMONIAE  Final   Report Status 06/13/2015 FINAL  Final   Organism ID, Bacteria STREPTOCOCCUS PNEUMONIAE  Final      Susceptibility   Streptococcus pneumoniae - MIC*    ERYTHROMYCIN <=0.12 SENSITIVE Sensitive     VANCOMYCIN 0.5 SENSITIVE Sensitive     TRIMETH/SULFA <=10 SENSITIVE Sensitive     CLINDAMYCIN <=0.25 SENSITIVE Sensitive     LEVOFLOXACIN Value in next row Sensitive      SENSITIVE1    LINEZOLID Value in next row Sensitive      SENSITIVE<=2    * HEAVY GROWTH STREPTOCOCCUS PNEUMONIAE  Medical History: Past Medical History  Diagnosis Date  . COPD (chronic obstructive pulmonary disease) ([place])   . Hypertension   . Ischemic cardiomyopathy     a.   . Coronary artery disease     a. s/p multiple stenting in 2005  . History of ventricular tachycardia   . PVD  (peripheral vascular disease) ([place])   . PAD (peripheral artery disease) ([place])   . HLD (hyperlipidemia)   . Chronic back pain   . MI, old   . MRSA (methicillin resistant staph aureus) culture positive     left foot wound  . CHF (congestive heart failure) ([place])   . Pneumonia   . Asthma   . Cancer [facility])     Assessment: 69 yo female requiring constipation prophylaxis. Pharmacy consulted for management and monitoring. Miralax was started yesterday due to no BM noted- however dose was not given by nurse. Nurse noted in [facility] that patient's bowels are now moving.   Plan:  Will make Miralax PRN and and continue with docusate/Senna twice daily scheduled. Pharmacy will continue to follow and make adjustments as needed.   Nancy Fetter, PharmD Pharmacy Resident

## 2015-06-16 NOTE — Progress Notes (Signed)
Blood glucose rechecked and is 71.

## 2015-06-16 NOTE — Progress Notes (Addendum)
PARENTERAL NUTRITION CONSULT NOTE - INITIAL  Pharmacy Consult for Electrolyte Monitoring and management   Allergies  Allergen Reactions  . Nsaids Nausea And Vomiting and Other (See Comments)    Reaction:  GI distress and burning     Patient Measurements: Height: 5\' 2"  (157.5 cm) Weight: 128 lb 1.4 oz (58.1 kg) IBW/kg (Calculated) : 50.1  Vital Signs: Temp: 98.6 F (37 C) (11/17 1100) Temp Source: Core (Comment) (11/17 0400) BP: 149/86 mmHg (11/17 1100) Pulse Rate: 107 (11/17 1100) Intake/Output from previous day: 11/16 0701 - 11/17 0700 In: 2486.9 [I.V.:612.7; NG/GT:1274.3; IV Piggyback:600] Out: 1235 [Urine:175] Intake/Output from this shift: Total I/O In: 375 [I.V.:75; NG/GT:90; IV Piggyback:210] Out: 75 [Other:75]  Labs:  Recent Labs  06/13/15 2057 06/14/15 0543 06/16/15 0520  WBC 24.5* 26.7* 20.9*  HGB 7.4* 7.5* 7.1*  HCT 24.8* 25.3* 23.6*  PLT 124* 105* 88*  INR 1.45 1.28  --      Recent Labs  06/14/15 0543  06/16/15 0112 06/16/15 0520 06/16/15 0911  NA 134*  < > 139 140 138  K 3.9  < > 4.2 4.4 4.0  CL 100*  < > 105 106 107  CO2 29  < > 29 29 30   GLUCOSE 131*  < > 125* 130* 97  BUN 54*  < > 18 18 18   CREATININE 3.16*  < > 1.01* 0.94 0.98  CALCIUM 7.6*  < > 8.2* 8.4* 8.1*  MG 1.7  < > 1.7 1.6* 1.6*  PHOS 3.8  < > 1.5* 1.4* 1.4*  PROT 4.7*  --   --  4.8*  --   ALBUMIN 1.9*  < > 2.1* 2.1*  2.1* 2.0*  AST 280*  --   --  78*  --   ALT 819*  --   --  407*  --   ALKPHOS 187*  --   --  147*  --   BILITOT 0.5  --   --  0.6  --   BILIDIR  --   --   --  0.1  --   IBILI  --   --   --  0.5  --   < > = values in this interval not displayed. Estimated Creatinine Clearance: 43.5 mL/min (by C-G formula based on Cr of 0.98).    Recent Labs  06/15/15 2333 06/16/15 0338 06/16/15 0731  GLUCAP 84 109* 116*    Medical History: Past Medical History  Diagnosis Date  . COPD (chronic obstructive pulmonary disease) ([place])   . Hypertension   . Ischemic  cardiomyopathy     a.   . Coronary artery disease     a. s/p multiple stenting in 2005  . History of ventricular tachycardia   . PVD (peripheral vascular disease) ([place])   . PAD (peripheral artery disease) ([place])   . HLD (hyperlipidemia)   . Chronic back pain   . MI, old   . MRSA (methicillin resistant staph aureus) culture positive     left foot wound  . CHF (congestive heart failure) ([place])   . Pneumonia   . Asthma   . Cancer [facility])     Assessment: 69 yo patient who is receiving CRRT and currently intubated with respiratory failure. Pharmacy consulted for monitoring and management of electrolytes.  Phos: 1.4 Magnesium: 1.6  Plan:  Will replace with 38mmol of potassium phoshate and  1 gm of magnesium. Labs ordered every 4 hours for renal monitoring. Pharmacy will continue to monitor and replacement electrolytes as  needed.   Nancy Fetter, PharmD Pharmacy Resident

## 2015-06-16 NOTE — Care Management (Signed)
Patient is now DNR.  Will monitor care through the weekend.  Patient has been followed by [facility] on recents discharges.  Agency wasun able to locate patient when discharged and had to be closed to service

## 2015-06-16 NOTE — Progress Notes (Signed)
Responds to voice and follows commands.  Sedated with fentanyl. No display of pain.  NSR per cardiac monitor. Tolerating tube feeds. CRRT treatment ongoing.  100cc UOP this shift. Daughter visited this afternoon and spoke with Dr. Mortimer Fries and discussed plan of care.

## 2015-06-16 NOTE — Consult Note (Signed)
After discussion with daughter and Husband, patient with multi organ failure, patient now DNR. Will re-assess patients clinical condition though the weekend Patient has failed SAT/SBT today and yesterday due to severe resp muscle fatigue

## 2015-06-16 NOTE — Progress Notes (Signed)
RN asked Dr. Mortimer Fries to come to bedside and assess patient. Patient has been in spontaneous mode since 1125.  Patient has red face and increased work of breathing. Dr. Mortimer Fries now at bedside, assessed patient and now speaking with patient's daughter, [name].  MD gave order for RN to resedate patient and to place patient back on full support on ventilator. Fentanyl restarted.

## 2015-06-16 NOTE — Care Management (Signed)
During progression, discussed that palliative care consult may be of benefit.  Patient is full code and prognosis is poor

## 2015-06-16 NOTE — Progress Notes (Signed)
Cathflo removed from both dialysis access ports. New cartridge loaded and CRRT re-started at this time.

## 2015-06-16 NOTE — Progress Notes (Signed)
   06/16/15 1400  Clinical Encounter Type  Visited With Patient;Health care provider  Visit Type Follow-up  Consult/Referral To Chaplain  Spiritual Encounters  Spiritual Needs Emotional  Stress Factors  Patient Stress Factors Not reviewed  Chaplain rounded in unit and offered a compassionate presence and support as applicable. Chaplain Alexcia Schools A. Chirstina Haan Ext. [phone]

## 2015-06-16 NOTE — Consult Note (Signed)
Palliative Care consult initiated with patient's daughter Lynn Butler and patient's ex-husband Lynn Butler (Elaine's father). Patient has been unsuccessfully weaned from ventilation x2 in the past 2 days. Family is aware that patient's prognosis is poor and that it is likely that patient will not recover from this event. Family does acknowledge that patient would not want to have her life prolonged under these circumstances, but are not yet ready to make the decision for comfort care only. Family does verbalize understanding that at some point, decisions will need to be made regarding possible terminal wean and end-of-life care which Palliative Care can address accordingly. Both Lynn Butler and [name] verbalize understanding and agreement with patient's code status now being DNR per earlier conversations with Dr. Mortimer Fries. Per Dr. Mortimer Fries, patient will continue to be monitored through the weekend. Update given regarding this consult with Lindajo Royal, RN care manger and Shela Leff, LCSW.Update also given to Dr. Jerilynn Mages. Megan Salon, Palliative Medicine who is in agreement. Writer will plan to follow-up with patient and family tomorrow to continue this consult.   Atha Starks, MSW, LCSW Palliative Care social worker [phone] (c)

## 2015-06-16 NOTE — Progress Notes (Signed)
PULMONARY / CRITICAL CARE MEDICINE   Name: Jordain Abelson MRN: [id number] DOB: [birth year]    ADMISSION DATE:  06/10/2015 CONSULTATION DATE:  11/12   CC: follow up resp failure HPI:  Patient remains intubated, failed SAT/SBT due to resp muscle fatigue  remains critically ill, prognosis is poor, on CRRT  MAJOR EVENTS/TEST RESULTS: 11/11 CTAP: NAD. Severe atherosclerosis 11/11 CT head: NAD 11/12 CT head: NAD 11/13 Worsening renal function. Renal consultation.  Deemed poor candidate for HD/CRRT of any duration. Goals of care discussed with pt's daughter 11/14-may start CRRT, vasc cath may be needed 11/16 failed SAT/SBT  INDWELLING DEVICES:: ETT 11/11 >>  L IJ CVL 11/11 >> RT femoral vasc cath>>11/14  MICRO DATA: Urine 11/11 >> NEG Blood 11/11 >>  MRSA PCR 11/12 >> POS Resp 11/12 >>     ANTIMICROBIALS: Vanc 11/11 >>  Pip-tazo 11/11 >>    SUBJECTIVE:  Fentanyl gtt resumed 11/12 PM. RASS -3,  Not F/C. No distress Vent setting noted, on vasopressors, has metabolic acidosis, on crrt Plan for SAT/SBt today  VITAL SIGNS: Temp:  [96.4 F (35.8 C)-98.4 F (36.9 C)] 98.1 F (36.7 C) (11/17 0800) Pulse Rate:  [76-114] 76 (11/17 0800) Resp:  [17-29] 23 (11/17 0800) BP: (126-164)/(49-83) 127/55 mmHg (11/17 0800) SpO2:  [98 %-100 %] 100 % (11/17 0800) FiO2 (%):  [28 %] 28 % (11/17 0700) Weight:  [128 lb 1.4 oz (58.1 kg)] 128 lb 1.4 oz (58.1 kg) (11/17 0500) HEMODYNAMICS:   VENTILATOR SETTINGS: Vent Mode:  [-] PRVC FiO2 (%):  [28 %] 28 % Set Rate:  [0 bmp-24 bmp] 24 bmp Vt Set:  [400 mL] 400 mL PEEP:  [3 cmH20-5 cmH20] 5 cmH20 Pressure Support:  [5 cmH20] 5 cmH20 INTAKE / OUTPUT:  Intake/Output Summary (Last 24 hours) at 06/16/15 0849 Last data filed at 06/16/15 0800  Gross per 24 hour  Intake 2319.25 ml  Output   1187 ml  Net 1132.25 ml    PHYSICAL EXAMINATION: General: RASS -3, frail appearing, appears much older than chronological age Neuro: PERRL,  minimal withdrawal from pain, DTRs symmetric HEENT: NCAT, forehead lesion Cardiovascular: reg, no M Lungs: no wheezes Abdomen: scaphoid, diminished BS Ext: severe atrophy, no edema GCS 8T  LABS:  CBC  Recent Labs Lab 06/13/15 2057 06/14/15 0543 06/16/15 0520  WBC 24.5* 26.7* 20.9*  HGB 7.4* 7.5* 7.1*  HCT 24.8* 25.3* 23.6*  PLT 124* 105* 88*   Coag's  Recent Labs Lab 06/10/15 1930 06/11/15 1351 06/12/15 1101 06/13/15 2057 06/14/15 0543  APTT 41* 30 29  --   --   INR 2.26 1.93  --  1.45 1.28   BMET  Recent Labs Lab 06/15/15 2113 06/16/15 0112 06/16/15 0520  NA 139 139 140  K 4.2 4.2 4.4  CL 106 105 106  CO2 29 29 29   BUN 19 18 18   CREATININE 1.04* 1.01* 0.94  GLUCOSE 116* 125* 130*   Electrolytes  Recent Labs Lab 06/15/15 2113 06/16/15 0112 06/16/15 0520  CALCIUM 8.2* 8.2* 8.4*  MG 1.7 1.7 1.6*  PHOS 1.8* 1.5* 1.4*   Sepsis Markers  Recent Labs Lab 06/10/15 1930 06/11/15 0229 06/12/15 0330  LATICACIDVEN 6.7* 1.8  --   PROCALCITON  --   --  32.06   ABG  Recent Labs Lab 06/13/15 1140 06/14/15 0825 06/15/15 1125  PHART 7.29* 7.33* 7.36  PCO2ART 49* 58* 53*  PO2ART 64* 48* 39*   Liver Enzymes  Recent Labs Lab 06/12/15 0330  06/14/15 0543  06/15/15 2113 06/16/15 0112 06/16/15 0520  AST 2031*  --  280*  --   --   --  78*  ALT 1958*  --  819*  --   --   --  407*  ALKPHOS 350*  --  187*  --   --   --  147*  BILITOT 0.8  --  0.5  --   --   --  0.6  ALBUMIN 2.4*  < > 1.9*  < > 2.0* 2.1* 2.1*  2.1*  < > = values in this interval not displayed. Cardiac Enzymes  Recent Labs Lab 06/11/15 1351 06/11/15 2252 06/12/15 0540  TROPONINI 17.69* 15.54* 12.20*   Glucose  Recent Labs Lab 06/15/15 1159 06/15/15 1624 06/15/15 1958 06/15/15 2333 06/16/15 0338 06/16/15 0731  GLUCAP 161* 80 67 84 109* 116*    CXR: CM, no edema or infiltrates   ASSESSMENT / PLAN:  69 yo female with acute resp failure and acute multiorgan  failure(resp,cardiovascualr,renal,liver) from acute strep  pneumonia and cardiogenic/septic shock With COPD  PULMONARY A: Acute resp failure-will attempt another SAT/SBT today Chronic COPD without acute bronchospasm P:   Cont full vent support - settings reviewed and/or adjusted-plan for SAT/SBT today Cont vent bundle -follow up ABG and CXR as needed  CARDIOVASCULAR A:  Acute MI-cardiogenic shock -vasopressors as needed CAD - 2VD deemed to not be amenable to revascularization PVD P:  MAP goal > 65 mmHg Cards consultation: no role for cardiac intervention due to MODS, poor baseline -follow up cardiology recs  RENAL A:  AKI Mild metabolic acidosis Mild hyperkalemia P:   Monitor BMET intermittently Monitor I/Os Correct electrolytes as indicated HCO3 gtt initiated 11/13 Renal consultation 11/13-s/p HD vasc cath-started on CRRT-  GASTROINTESTINAL A:   No issues P:   SUP: IV PPI Continue TFs   HEMATOLOGIC A:   Mild anemia without overt bleeding P:  DVT px: SQ heparin Monitor CBC intermittently Transfuse per usual guidelines  INFECTIOUS Strep pneumonia P:   Monitor temp, WBC count   ENDOCRINE A:   Hyperglycemia without documented DM H/O frequent steroid dosing - suspect adrenal insuff P:   Cont SSI Cont stress dose steroids  NEUROLOGIC A:   Severe acute encephalopathy Severe baseline debilitation P:   RASS goal: -1, -2 Cont to address goals of care.    I have personally obtained a history, examined the patient, evaluated Pertinent laboratory and RadioGraphic/imaging results, and  formulated the assessment and plan The Patient requires high complexity decision making for assessment and support, frequent evaluation and titration of therapies, application of advanced monitoring technologies and extensive interpretation of multiple databases. Critical Care Time devoted to patient care services described in this note is 40 minutes.   Overall, patient is  critically ill, prognosis is guarded.  Patient with Multiorgan failure and at high risk for cardiac arrest and death. Will obtain palliative care consult   Corrin Parker, M.D.  [facility]  Medical Director [id number] Director [employer] Department       06/16/2015, 8:49 AM

## 2015-06-17 LAB — RENAL FUNCTION PANEL
ALBUMIN: 2.1 g/dL — AB (ref 3.5–5.0)
ALBUMIN: 1.9 g/dL — AB (ref 3.5–5.0)
ALBUMIN: 2 g/dL — AB (ref 3.5–5.0)
ANION GAP: 4 — AB (ref 5–15)
ANION GAP: 3 — AB (ref 5–15)
Albumin: 1.9 g/dL — ABNORMAL LOW (ref 3.5–5.0)
Albumin: 2 g/dL — ABNORMAL LOW (ref 3.5–5.0)
Albumin: 1.7 g/dL — ABNORMAL LOW (ref 3.5–5.0)
Anion gap: 3 — ABNORMAL LOW (ref 5–15)
Anion gap: 3 — ABNORMAL LOW (ref 5–15)
Anion gap: 4 — ABNORMAL LOW (ref 5–15)
Anion gap: 4 — ABNORMAL LOW (ref 5–15)
BUN: 11 mg/dL (ref 6–20)
BUN: 12 mg/dL (ref 6–20)
BUN: 12 mg/dL (ref 6–20)
BUN: 13 mg/dL (ref 6–20)
BUN: 14 mg/dL (ref 6–20)
BUN: 15 mg/dL (ref 6–20)
CALCIUM: 7.6 mg/dL — AB (ref 8.9–10.3)
CALCIUM: 7.6 mg/dL — AB (ref 8.9–10.3)
CALCIUM: 7.8 mg/dL — AB (ref 8.9–10.3)
CALCIUM: 7.8 mg/dL — AB (ref 8.9–10.3)
CHLORIDE: 106 mmol/L (ref 101–111)
CHLORIDE: 107 mmol/L (ref 101–111)
CHLORIDE: 107 mmol/L (ref 101–111)
CHLORIDE: 111 mmol/L (ref 101–111)
CO2: 26 mmol/L (ref 22–32)
CO2: 27 mmol/L (ref 22–32)
CO2: 27 mmol/L (ref 22–32)
CO2: 28 mmol/L (ref 22–32)
CO2: 28 mmol/L (ref 22–32)
CO2: 28 mmol/L (ref 22–32)
CREATININE: 0.75 mg/dL (ref 0.44–1.00)
CREATININE: 0.76 mg/dL (ref 0.44–1.00)
CREATININE: 0.78 mg/dL (ref 0.44–1.00)
CREATININE: 0.93 mg/dL (ref 0.44–1.00)
Calcium: 7.9 mg/dL — ABNORMAL LOW (ref 8.9–10.3)
Calcium: 7.8 mg/dL — ABNORMAL LOW (ref 8.9–10.3)
Chloride: 105 mmol/L (ref 101–111)
Chloride: 104 mmol/L (ref 101–111)
Creatinine, Ser: 0.83 mg/dL (ref 0.44–1.00)
Creatinine, Ser: 0.72 mg/dL (ref 0.44–1.00)
GFR calc Af Amer: >60 mL/min (ref >60)
GFR calc Af Amer: >60 mL/min (ref >60)
GFR calc Af Amer: >60 mL/min (ref >60)
GFR calc non Af Amer: >60 mL/min (ref >60)
GLUCOSE: 101 mg/dL — AB (ref 65–99)
GLUCOSE: 104 mg/dL — AB (ref 65–99)
GLUCOSE: 184 mg/dL — AB (ref 65–99)
Glucose, Bld: 111 mg/dL — ABNORMAL HIGH (ref 65–99)
Glucose, Bld: 125 mg/dL — ABNORMAL HIGH (ref 65–99)
Glucose, Bld: 175 mg/dL — ABNORMAL HIGH (ref 65–99)
PHOSPHORUS: 1.5 mg/dL — AB (ref 2.5–4.6)
PHOSPHORUS: 1.5 mg/dL — AB (ref 2.5–4.6)
PHOSPHORUS: 2.3 mg/dL — AB (ref 2.5–4.6)
PHOSPHORUS: 2.6 mg/dL (ref 2.5–4.6)
POTASSIUM: 4.8 mmol/L (ref 3.5–5.1)
POTASSIUM: 4.6 mmol/L (ref 3.5–5.1)
POTASSIUM: 4.7 mmol/L (ref 3.5–5.1)
Phosphorus: 2.5 mg/dL (ref 2.5–4.6)
Phosphorus: 2.1 mg/dL — ABNORMAL LOW (ref 2.5–4.6)
Potassium: 4.6 mmol/L (ref 3.5–5.1)
Potassium: 4.5 mmol/L (ref 3.5–5.1)
Potassium: 4.4 mmol/L (ref 3.5–5.1)
SODIUM: 135 mmol/L (ref 135–145)
SODIUM: 136 mmol/L (ref 135–145)
SODIUM: 137 mmol/L (ref 135–145)
SODIUM: 138 mmol/L (ref 135–145)
Sodium: 141 mmol/L (ref 135–145)
Sodium: 138 mmol/L (ref 135–145)

## 2015-06-17 LAB — BASIC METABOLIC PANEL
Anion gap: 5 (ref 5–15)
BUN: 14 mg/dL (ref 6–20)
CALCIUM: 8.1 mg/dL — AB (ref 8.9–10.3)
CHLORIDE: 110 mmol/L (ref 101–111)
CO2: 25 mmol/L (ref 22–32)
CREATININE: 0.74 mg/dL (ref 0.44–1.00)
GFR calc non Af Amer: >60 mL/min (ref >60)
GLUCOSE: 101 mg/dL — AB (ref 65–99)
Potassium: 4.8 mmol/L (ref 3.5–5.1)
Sodium: 140 mmol/L (ref 135–145)

## 2015-06-17 LAB — CBC
HEMATOCRIT: 23.7 % — AB (ref 35.0–47.0)
Hemoglobin: 6.8 g/dL — ABNORMAL LOW (ref 12.0–16.0)
MCH: 19.6 pg — AB (ref 26.0–34.0)
MCHC: 28.5 g/dL — AB (ref 32.0–36.0)
MCV: 68.7 fL — ABNORMAL LOW (ref 80.0–100.0)
Platelets: 88 10*3/uL — ABNORMAL LOW (ref 150–440)
RBC: 3.46 MIL/uL — ABNORMAL LOW (ref 3.80–5.20)
RDW: 22.4 % — AB (ref 11.5–14.5)
WBC: 19 10*3/uL — AB (ref 3.6–11.0)

## 2015-06-17 LAB — GLUCOSE, CAPILLARY
GLUCOSE-CAPILLARY: 11 mg/dL — AB (ref 65–99)
GLUCOSE-CAPILLARY: 200 mg/dL — AB (ref 65–99)
GLUCOSE-CAPILLARY: 137 mg/dL — AB (ref 65–99)
GLUCOSE-CAPILLARY: 65 mg/dL (ref 65–99)
GLUCOSE-CAPILLARY: 156 mg/dL — AB (ref 65–99)
Glucose-Capillary: 114 mg/dL — ABNORMAL HIGH (ref 65–99)
Glucose-Capillary: 60 mg/dL — ABNORMAL LOW (ref 65–99)
Glucose-Capillary: 79 mg/dL (ref 65–99)
Glucose-Capillary: 90 mg/dL (ref 65–99)
Glucose-Capillary: 98 mg/dL (ref 65–99)
Glucose-Capillary: <10 mg/dL — CL (ref 65–99)

## 2015-06-17 LAB — MAGNESIUM
MAGNESIUM: 1.6 mg/dL — AB (ref 1.7–2.4)
MAGNESIUM: 1.6 mg/dL — AB (ref 1.7–2.4)
MAGNESIUM: 1.9 mg/dL (ref 1.7–2.4)
MAGNESIUM: 2.2 mg/dL (ref 1.7–2.4)
Magnesium: 1.6 mg/dL — ABNORMAL LOW (ref 1.7–2.4)

## 2015-06-17 LAB — PREPARE RBC (CROSSMATCH)

## 2015-06-17 LAB — ABO/RH: ABO/RH(D): O POS

## 2015-06-17 MED ORDER — DEXTROSE 10 % IV SOLN
INTRAVENOUS | Status: DC
  Administered 2015-06-17 – 2015-06-18 (×4): via INTRAVENOUS
  Administered 2015-06-18: 100 mL/h via INTRAVENOUS
  Administered 2015-06-19 (×2): via INTRAVENOUS

## 2015-06-17 MED ORDER — DEXTROSE 50 % IV SOLN
INTRAVENOUS | Status: AC
  Administered 2015-06-17: 25 g via INTRAVENOUS
  Filled 2015-06-17: qty 50

## 2015-06-17 MED ORDER — SODIUM CHLORIDE 0.9 % IV SOLN: Freq: Once | INTRAVENOUS | Status: DC

## 2015-06-17 MED ORDER — DEXTROSE 50 % IV SOLN
25.0000 g | Freq: Once | INTRAVENOUS | Status: AC
  Administered 2015-06-17: 25 g via INTRAVENOUS

## 2015-06-17 MED ORDER — DEXTROSE 50 % IV SOLN
INTRAVENOUS | Status: AC
  Filled 2015-06-17: qty 50

## 2015-06-17 MED ORDER — METHYLPREDNISOLONE SODIUM SUCC 40 MG IJ SOLR
40.0000 mg | Freq: Every day | INTRAMUSCULAR | Status: DC
  Administered 2015-06-17 – 2015-06-20 (×4): 40 mg via INTRAVENOUS
  Filled 2015-06-17 (×4): qty 1

## 2015-06-17 MED ORDER — DEXTROSE 5 % IV SOLN
0.0000 ug/min | INTRAVENOUS | Status: DC
  Filled 2015-06-17: qty 16

## 2015-06-17 MED ORDER — MAGNESIUM SULFATE 2 GM/50ML IV SOLN
2.0000 g | Freq: Once | INTRAVENOUS | Status: AC
  Administered 2015-06-17: 2 g via INTRAVENOUS
  Filled 2015-06-17: qty 50

## 2015-06-17 NOTE — Progress Notes (Signed)
Per Dr. Mortimer Fries, RN drew renal panel and then performed fingerstick which resulted 65 and then performed capillary blood glucose on patient's ear which resulted 90.  Waiting for venous results.  RN made MD aware of capillary results from ear and finger. Dr. Mortimer Fries acknowledged and did not want to treat blood glucose at this time and wishes to wait for venous blood glucose results.

## 2015-06-17 NOTE — Clinical Documentation Improvement (Signed)
Internal Medicine  Can the diagnosis of Non-Pressure Ulcers be further specified by  the location, and  stage?   Document Specific Site    Document Ulcer Depth - limited to skin breakdown, with fat layer exposed, with muscle necrosis, with bone necrosis  Stage (1-4)  Deep tissue injury  Other condition  Unable to clinically determine   Supporting Information: per floor nurse assessment  "Deep Tissue Injury - Purple or maroon localized area of discolored intact skin or blood-filled blister due to damage of underlying soft tissue from pressure and/or shear" "left/ Right heels"    Please exercise your independent, professional judgment when responding. A specific answer is not anticipated or expected.   Thank You, [place] [phone]

## 2015-06-17 NOTE — Progress Notes (Signed)
Resting well at this time. Arouses easily to voice and follows commands. Blood pressure came up when RN woke patient up.  BP up to 122/54 at this time without levophed which has not been started.  NSR with PVCs per cardiac monitor. Tolerating ventilator with o2 sats 100%. 145cc UOP this shift. CRRT running well. Daughter visited this shift and spoke with Dr. Verdell Carmine and Dr. Ashby Dawes and was updated. Bair hugger applied this evening because temperature was not maintained with warm blankets and room temperature adjustments. Report given to [facility], RN who is now taking over patient's care.

## 2015-06-17 NOTE — Progress Notes (Signed)
Inpatient Diabetes Program Recommendations  AACE/ADA: New Consensus Statement on Inpatient Glycemic Control (2015)  Target Ranges:  Prepandial:   less than 140 mg/dL      Peak postprandial:   less than 180 mg/dL (1-2 hours)      Critically ill patients:  140 - 180 mg/dL    Results for Lynn Butler, Lynn Butler (MRN [id number]) as of 06/17/2015 10:54  Ref. Range 06/17/2015 00:02 06/17/2015 02:12 06/17/2015 03:54 06/17/2015 03:56 06/17/2015 04:28 06/17/2015 07:34 06/17/2015 09:53 06/17/2015 09:55  Glucose-Capillary Latest Ref Range: 65-99 mg/dL 60 (L) 79 11 (LL) <10 (LL) 200 (H) 137 (H) 65 90     Current Orders: Novolog Moderate SSI (0-15 units) Q4 hours     MD- Note patient with episodes of Hypoglycemia.  Please reduce Novolog SSI to Sensitive scale (0-9 units) Q4 hours OR may consider discontinuing Novolog SSI altogether     --Will follow patient during hospitalization--  Wyn Quaker RN, MSN, CDE Diabetes Coordinator Inpatient Glycemic Control Team Team Pager: [phone] (8a-5p)

## 2015-06-17 NOTE — Progress Notes (Signed)
[place] [facility] [place] NAME: Lynn Butler    MR#:  [id number]  DATE OF BIRTH:  02-18-1946  SUBJECTIVE:  CHIEF COMPLAINT:   Chief Complaint  Patient presents with  . Respiratory Arrest   - admitted for septic shock, remains on event. Failed SBT for the past 2 days due to desaturation and tachycardia - remains oliguric and on CRRT.  Critically ill.    REVIEW OF SYSTEMS:  Review of Systems  Unable to perform ROS: critical illness    DRUG ALLERGIES:   Allergies  Allergen Reactions  . Nsaids Nausea And Vomiting and Other (See Comments)    Reaction:  GI distress and burning     VITALS:  Blood pressure 112/59, pulse 83, temperature 96.8 F (36 C), temperature source Core (Comment), resp. rate 20, height 5\' 2"  (1.575 m), weight 54.1 kg (119 lb 4.3 oz), SpO2 100 %.  PHYSICAL EXAMINATION:  Physical Exam  GENERAL:  69 y.o.-year-old patient lying in the bed critically ill and intubated & sedated.  EYES: Pupils equal, round, reactive to light. No scleral icterus. HEENT: Head atraumatic, normocephalic.  Forehead mass has been removed, s/p plastic surgery and skin graft in place. Oropharynx with ET tube, OG tube.  NECK:  Supple, no jugular venous distention. No thyroid enlargement, no tenderness.  Triple lumen catheter on left ide of neck LUNGS: Normal breath sounds bilaterally, no wheezing, rales,rhonchi or crepitation today. No use of accessory muscles of respiration. CARDIOVASCULAR: S1, S2 normal. No murmurs, rubs, or gallops.  ABDOMEN: Soft, nontender, nondistended. Bowel sounds present. No organomegaly or mass.  EXTREMITIES: No pedal edema, cyanosis, or clubbing. Feeble dorsalis pedis pulse palpable on right foot and dopplerable on left foot.  Dialysis catheter in left groin NEUROLOGIC: Sedated and intubated. Opens eyes but does not follow any commands.  PSYCHIATRIC: Sedated and intubated  SKIN: forehead skin graft in place.     LABORATORY PANEL:   CBC  Recent Labs Lab 06/17/15 0339  WBC 19.0*  HGB 6.8*  HCT 23.7*  PLT 88*   ------------------------------------------------------------------------------------------------------------------  Chemistries   Recent Labs Lab 06/16/15 0520  06/17/15 0946  06/17/15 1323  NA 140  < >  --   < > 137  K 4.4  < >  --   < > 4.5  CL 106  < >  --   < > 106  CO2 29  < >  --   < > 28  GLUCOSE 130*  < >  --   < > 125*  BUN 18  < >  --   < > 12  CREATININE 0.94  < >  --   < > 0.78  CALCIUM 8.4*  < >  --   < > 7.6*  MG 1.6*  < > 1.6*  --   --   AST 78*  --   --   --   --   ALT 407*  --   --   --   --   ALKPHOS 147*  --   --   --   --   BILITOT 0.6  --   --   --   --   < > = values in this interval not displayed. ------------------------------------------------------------------------------------------------------------------  Cardiac Enzymes  Recent Labs Lab 06/12/15 0540  TROPONINI 12.20*   ------------------------------------------------------------------------------------------------------------------  RADIOLOGY:  No results found.  ASSESSMENT AND PLAN:   69y/oF with chronic COPD on home o2, multiple abdominal surgeries for  obstruction, CAD, HTN, CHF, PAD admitted for septic shock  1. Septic shock- due to pneumonia - blood cultures negative, sputum cultures with streptococcus pneumoniae - cont. rocephin - hemoDynamically stable and afebrile.  2. Acute respiratory failure- due to pneumonia, COPD Exacerbation - Currently intubated and difficult to wean.  Continue care as per pulmonary. -Continue Pulmicort nebs, IV steroids, DuoNeb's  3. ARF- with oliguria, cr normalised, still with minimal urine output - cont. on CRRT, appreciate nephrology consult  4. Elevated troponins- NSTEMI, known severe 2 vessel disease in past with no chance for revascularization - appreciate cards consult - received IV heparin for 48hrs last week - no active  symptoms for now  5. Acute on chronic anemia- severe iron deficiency.   - start IV iron for 3 days and oral iron supplements - Hg. Today 6.8 and will transfuse 1 units of blood and follow hg.   6. Chronic pain syndrome- always in pain, follows with pain management clinic and on pain contract - cont. on fentanyl drip for now - add oral prn meds, her oxycontin can be restarted after extubation as it cannot be crushed  7. Nutrition- cont. tube feeds  Prognosis is poor and palliative care consult to discuss goals of care.    All the records are reviewed and case discussed with Care Management/Social Workerr. Management plans discussed with the patient, family and they are in agreement.  CODE STATUS: Full Code  TOTAL CRITICAL CARE TIME SPENT IN TAKING CARE OF THIS PATIENT: 35 minutes.   POSSIBLE D/C IN 3-4 DAYS, DEPENDING ON CLINICAL CONDITION.   Henreitta Leber M.D on 06/17/2015 at 4:29 PM  Between 7am to 6pm - Pager - [phone]  After 6pm go to [email or web address] - password [email or web address] [facility]  [facility]  Office  [phone]  CC: Primary care physician; Juanell Fairly, MD

## 2015-06-17 NOTE — Progress Notes (Signed)
[facility]  ROUNDING NOTE   Subjective:  Remains critically ill at this point in time. Currently on the ventilator. CRRT continues to progress well.  Objective:  Vital signs in last 24 hours:  Temp:  [94.1 F (34.5 C)-98.8 F (37.1 C)] 95.7 F (35.4 C) (11/18 0800) Pulse Rate:  [64-107] 83 (11/18 0800) Resp:  [18-29] 24 (11/18 0800) BP: (91-149)/(40-86) 117/76 mmHg (11/18 0800) SpO2:  [100 %] 100 % (11/18 0848) FiO2 (%):  [28 %] 28 % (11/18 0848) Weight:  [54.1 kg (119 lb 4.3 oz)] 54.1 kg (119 lb 4.3 oz) (11/18 0528)  Weight change: -4 kg (-8 lb 13.1 oz) Filed Weights   06/15/15 0540 06/16/15 0500 06/17/15 0528  Weight: 60 kg (132 lb 4.4 oz) 58.1 kg (128 lb 1.4 oz) 54.1 kg (119 lb 4.3 oz)    Intake/Output: I/O last 3 completed shifts: In: 4837.5 [I.V.:1772.5; NG/GT:1895; IV [id number] Out: [id number] [Urine:245; DT:1471192; Stool:1]   Intake/Output this shift:  Total I/O In: 400 [I.V.:135; Other:30; NG/GT:75; IV Piggyback:160] Out: 65 [Other:49]  Physical Exam: General: Critically ill appearing  Head: Healing wound on forehead ETT in place  Eyes: Eyes open  Neck: Supple, trachea midline  Lungs:  Bilateral rhonchi, vent assisted  Heart: S1S2 no rubs  Abdomen:  Soft, nontender, BS present   Extremities:  trace peripheral edema.  Neurologic: Did follow simple commands  Skin: Large forehead scar from prior skin cancer       Basic Metabolic Panel:  Recent Labs Lab 06/16/15 0911 06/16/15 1353 06/16/15 1535 06/16/15 1722 06/16/15 1725 06/17/15 0011 06/17/15 0112 06/17/15 0339  NA 138 141  --   --  139 138 141 140  K 4.0 3.8  --   --  4.2 4.6 4.8 4.8  CL 107 109  --   --  108 107 111 110  CO2 30 29  --   --  28 28 26 25   GLUCOSE 97 97  --   --  120* 104* 101* 101*  BUN 18 19  --   --  18 15 14 14   CREATININE 0.98 1.11*  --   --  1.04* 0.93 0.83 0.74  CALCIUM 8.1* 7.7*  --   --  7.8* 7.8* 7.9* 8.1*  MG 1.6*  --  1.9 1.9  --  1.6*  --  1.6*   PHOS 1.4* 1.6*  --   --  2.1* 2.6 2.5  --     Liver Function Tests:  Recent Labs Lab 06/10/15 1930 06/11/15 0229 06/12/15 0330  06/14/15 0543  06/16/15 0520 06/16/15 0911 06/16/15 1353 06/16/15 1725 06/17/15 0011 06/17/15 0112  AST 1138* >2275* 2031*  --  280*  --  78*  --   --   --   --   --   ALT 643* 2215* 1958*  --  819*  --  407*  --   --   --   --   --   ALKPHOS 373* 379* 350*  --  187*  --  147*  --   --   --   --   --   BILITOT 1.2 1.4* 0.8  --  0.5  --  0.6  --   --   --   --   --   PROT 5.8* 5.2* 5.3*  --  4.7*  --  4.8*  --   --   --   --   --   ALBUMIN 2.6* 2.5* 2.4*  < >  1.9*  < > 2.1*  2.1* 2.0* 1.7* 1.9* 1.9* 2.1*  < > = values in this interval not displayed.  Recent Labs Lab 06/10/15 1930  LIPASE 46   No results for input(s): AMMONIA in the last 168 hours.  CBC:  Recent Labs Lab 06/10/15 1930  06/13/15 0559 06/13/15 2057 06/14/15 0543 06/16/15 0520 06/17/15 0339  WBC 21.1*  < > 27.4* 24.5* 26.7* 20.9* 19.0*  NEUTROABS 19.6*  --   --  22.7*  --   --   --   HGB 10.2*  < > 8.7* 7.4* 7.5* 7.1* 6.8*  HCT 35.3  < > 28.9* 24.8* 25.3* 23.6* 23.7*  MCV 69.1*  < > 66.2* 65.3* 65.5* 66.7* 68.7*  PLT 219  < > 152 124* 105* 88* 88*  < > = values in this interval not displayed.  Cardiac Enzymes:  Recent Labs Lab 06/10/15 1930 06/11/15 0229 06/11/15 1351 06/11/15 2252 06/12/15 0540  TROPONINI 0.76* 8.37* 17.69* 15.54* 12.20*    BNP: Invalid input(s): POCBNP  CBG:  Recent Labs Lab 06/17/15 0212 06/17/15 0354 06/17/15 0356 06/17/15 0428 06/17/15 0734  GLUCAP 79 11* <10* 200* 137*    Microbiology: Results for orders placed or performed during the hospital encounter of 06/10/15  Urine culture     Status: None   Collection Time: 06/10/15  7:30 PM  Result Value Ref Range Status   Specimen Description URINE, RANDOM  Final   Special Requests NONE  Final   Culture NO GROWTH 2 DAYS  Final   Report Status 06/12/2015 FINAL  Final  Blood  Culture (routine x 2)     Status: None   Collection Time: 06/10/15  8:10 PM  Result Value Ref Range Status   Specimen Description BLOOD RIGHT  Final   Special Requests BOTTLES DRAWN AEROBIC AND ANAEROBIC 5CC  Final   Culture  Setup Time   Final    GRAM POSITIVE COCCI AEROBIC BOTTLE ONLY CRITICAL RESULT CALLED TO, READ BACK BY AND VERIFIED WITH: RN Kathi Simpers 06/14/15 1358    Culture   Final    COAGULASE NEGATIVE STAPHYLOCOCCUS Results consistent with contamination.    Report Status 06/16/2015 FINAL  Final  Blood Culture (routine x 2)     Status: None   Collection Time: 06/10/15  8:20 PM  Result Value Ref Range Status   Specimen Description BLOOD RIGHT  Final   Special Requests BOTTLES DRAWN AEROBIC AND ANAEROBIC 2CC AERO, 4CC  Final   Culture NO GROWTH 5 DAYS  Final   Report Status 06/15/2015 FINAL  Final  MRSA PCR Screening     Status: Abnormal   Collection Time: 06/11/15  1:40 AM  Result Value Ref Range Status   MRSA by PCR POSITIVE (A) NEGATIVE Final    Comment:        The GeneXpert MRSA Assay (FDA approved for NASAL specimens only), is one component of a comprehensive MRSA colonization surveillance program. It is not intended to diagnose MRSA infection nor to guide or monitor treatment for MRSA infections. CRITICAL RESULT CALLED TO, READ BACK BY AND VERIFIED WITH: [facility] ALLEN AT [id number] 06/11/15.PMH   Culture, sputum-assessment     Status: None   Collection Time: 06/11/15  4:30 AM  Result Value Ref Range Status   Specimen Description TRACHEAL ASPIRATE  Final   Special Requests NONE  Final   Sputum evaluation THIS SPECIMEN IS ACCEPTABLE FOR SPUTUM CULTURE  Final   Report Status 06/11/2015 FINAL  Final  Culture,  respiratory (NON-Expectorated)     Status: None   Collection Time: 06/11/15  4:30 AM  Result Value Ref Range Status   Specimen Description TRACHEAL ASPIRATE  Final   Special Requests NONE Reflexed from [id number]  Final   Gram Stain   Final    GOOD  SPECIMEN - 80-90% WBCS MODERATE WBC SEEN MODERATE GRAM POSITIVE COCCI RARE GRAM NEGATIVE RODS    Culture HEAVY GROWTH STREPTOCOCCUS PNEUMONIAE  Final   Report Status 06/13/2015 FINAL  Final   Organism ID, Bacteria STREPTOCOCCUS PNEUMONIAE  Final      Susceptibility   Streptococcus pneumoniae - MIC*    ERYTHROMYCIN <=0.12 SENSITIVE Sensitive     VANCOMYCIN 0.5 SENSITIVE Sensitive     TRIMETH/SULFA <=10 SENSITIVE Sensitive     CLINDAMYCIN <=0.25 SENSITIVE Sensitive     LEVOFLOXACIN Value in next row Sensitive      SENSITIVE1    LINEZOLID Value in next row Sensitive      SENSITIVE<=2    * HEAVY GROWTH STREPTOCOCCUS PNEUMONIAE    Coagulation Studies: No results for input(s): LABPROT, INR in the last 72 hours.  Urinalysis: No results for input(s): COLORURINE, LABSPEC, PHURINE, GLUCOSEU, HGBUR, BILIRUBINUR, KETONESUR, PROTEINUR, UROBILINOGEN, NITRITE, LEUKOCYTESUR in the last 72 hours.  Invalid input(s): APPERANCEUR    Imaging: No results found.   Medications:   . dextrose 100 mL/hr at 06/17/15 0456  . feeding supplement (VITAL HIGH PROTEIN) 1,000 mL (06/16/15 1613)  . fentaNYL infusion INTRAVENOUS 400 mcg/hr (06/17/15 0140)  . pureflow 2,500 mL/hr at 06/17/15 0458   . antiseptic oral rinse  7 mL Mouth Rinse QID  . aspirin  81 mg Per Tube Daily  . budesonide  0.25 mg Nebulization BID  . cefTRIAXone (ROCEPHIN)  IV  2 g Intravenous Q12H  . chlorhexidine gluconate  15 mL Mouth Rinse BID  . ferrous sulfate  325 mg Oral BID WC  . free water  30 mL Per Tube 6 times per day  . insulin aspart  0-15 Units Subcutaneous 6 times per day  . ipratropium-albuterol  3 mL Nebulization Q6H  . iron sucrose  200 mg Intravenous Q24H  . pantoprazole (PROTONIX) IV  40 mg Intravenous Q24H  . senna-docusate  1 tablet Oral BID   acetaminophen **OR** acetaminophen, heparin, HYDROmorphone, LORazepam, polyethylene glycol, polyvinyl alcohol  Assessment/ Plan:  69 y.o. female  with a PMHx of  COPD, hypertension, ischemic cardiomyopathy with multiple coronary artery stents, history of ventricular tachycardia, peripheral vascular disease, hyperlipidemia, chronic back pain, history of congestive heart failure, who was admitted to [facility] on 06/10/2015 for evaluation of respiratory failure.   1. Acute renal failure secondary to acute tubular necrosis. Patient presented with multiorgan failure upon presentation. Baseline creatinine 0.75. -Oliguria persists at this time. Patient remains critically ill. Patient still on the ventilator. Therefore we will continue CRRT with current ultrafiltration.    2. Hyperkalemia. Continue 4 potassium bath.  3. Acute respiratory failure. Remains on the ventilator. Still tachypnea however. FiO2 only 28%.  4. Acute myocardial infarction.  Management per pulmonary critical care and hospitalist.   5.  Anemia unspecified: Hemoglobin currently down to 6.8. Receiving iron infusion. Consider blood transfusion as well.   LOS: 7 Lynn Butler 11/18/20169:08 AM

## 2015-06-17 NOTE — Progress Notes (Signed)
Patient resting intermittently overnight. Ativan received x 2 with relief of restlessness.  Patient treated for hypoglycemia with D50 x2, D10 added as well. CVVHD continues with no changes overnight. Patient daughter updated by phone see  CHL for further assessment. Will continue to assess for changes/need.

## 2015-06-17 NOTE — Progress Notes (Signed)
Patient following commands but restless and anxious therefore ativan given PRN.  RN spoke with Dr. Mortimer Fries and MD gave order to perform wakeup assessment when daughter arrives.

## 2015-06-17 NOTE — Progress Notes (Signed)
PULMONARY / CRITICAL CARE MEDICINE   Name: Lynn Butler MRN: [id number] DOB: [birth year]    ADMISSION DATE:  06/10/2015 CONSULTATION DATE:  11/12   CC: follow up resp failure HPI:  Patient remains intubated, failed SAT/SBT due to resp muscle fatigue  remains critically ill, prognosis is poor, on CRRT  MAJOR EVENTS/TEST RESULTS: 11/11 CTAP: NAD. Severe atherosclerosis 11/11 CT head: NAD 11/12 CT head: NAD 11/13 Worsening renal function. Renal consultation.  Deemed poor candidate for HD/CRRT of any duration. Goals of care discussed with pt's daughter 11/14-may start CRRT, vasc cath may be needed 11/16 failed SAT/SBT  INDWELLING DEVICES:: ETT 11/11 >>  L IJ CVL 11/11 >> RT femoral vasc cath>>11/14  MICRO DATA: Urine 11/11 >> NEG Blood 11/11 >>  MRSA PCR 11/12 >> POS Resp 11/12 >>     ANTIMICROBIALS: Vanc 11/11 >>  Pip-tazo 11/11 >>    SUBJECTIVE:  Fentanyl gtt resumed 11/12 PM. RASS -3,  Not F/C. No distress Vent setting noted, on vasopressors, has metabolic acidosis, on crrt Plan for SAT/SBt today  VITAL SIGNS: Temp:  [94.1 F (34.5 C)-98.8 F (37.1 C)] 95.7 F (35.4 C) (11/18 0900) Pulse Rate:  [64-107] 83 (11/18 0800) Resp:  [18-29] 23 (11/18 0900) BP: (91-149)/(40-86) 102/51 mmHg (11/18 0900) SpO2:  [100 %] 100 % (11/18 0848) FiO2 (%):  [28 %] 28 % (11/18 0848) Weight:  [119 lb 4.3 oz (54.1 kg)] 119 lb 4.3 oz (54.1 kg) (11/18 0528) HEMODYNAMICS:   VENTILATOR SETTINGS: Vent Mode:  [-] PRVC FiO2 (%):  [28 %] 28 % Set Rate:  [24 bmp] 24 bmp Vt Set:  [400 mL] 400 mL PEEP:  [5 cmH20] 5 cmH20 Pressure Support:  [8 cmH20] 8 cmH20 Plateau Pressure:  [17 cmH20] 17 cmH20 INTAKE / OUTPUT:  Intake/Output Summary (Last 24 hours) at 06/17/15 0936 Last data filed at 06/17/15 [id number]  Gross per 24 hour  Intake   4275 ml  Output   1051 ml  Net   3224 ml    PHYSICAL EXAMINATION: General: RASS -3, frail appearing, appears much older than chronological  age Neuro: PERRL, minimal withdrawal from pain, DTRs symmetric HEENT: NCAT, forehead lesion Cardiovascular: reg, no M Lungs: no wheezes Abdomen: scaphoid, diminished BS Ext: severe atrophy, no edema GCS 8T  LABS:  CBC  Recent Labs Lab 06/14/15 0543 06/16/15 0520 06/17/15 0339  WBC 26.7* 20.9* 19.0*  HGB 7.5* 7.1* 6.8*  HCT 25.3* 23.6* 23.7*  PLT 105* 88* 88*   Coag's  Recent Labs Lab 06/10/15 1930 06/11/15 1351 06/12/15 1101 06/13/15 2057 06/14/15 0543  APTT 41* 30 29  --   --   INR 2.26 1.93  --  1.45 1.28   BMET  Recent Labs Lab 06/17/15 0011 06/17/15 0112 06/17/15 0339  NA 138 141 140  K 4.6 4.8 4.8  CL 107 111 110  CO2 28 26 25   BUN 15 14 14   CREATININE 0.93 0.83 0.74  GLUCOSE 104* 101* 101*   Electrolytes  Recent Labs Lab 06/16/15 1722 06/16/15 1725 06/17/15 0011 06/17/15 0112 06/17/15 0339  CALCIUM  --  7.8* 7.8* 7.9* 8.1*  MG 1.9  --  1.6*  --  1.6*  PHOS  --  2.1* 2.6 2.5  --    Sepsis Markers  Recent Labs Lab 06/10/15 1930 06/11/15 0229 06/12/15 0330  LATICACIDVEN 6.7* 1.8  --   PROCALCITON  --   --  32.06   ABG  Recent Labs Lab 06/13/15 1140 06/14/15 0825 06/15/15 1125  PHART 7.29* 7.33* 7.36  PCO2ART 49* 58* 53*  PO2ART 64* 48* 39*   Liver Enzymes  Recent Labs Lab 06/12/15 0330  06/14/15 0543  06/16/15 0520  06/16/15 1725 06/17/15 0011 06/17/15 0112  AST 2031*  --  280*  --  78*  --   --   --   --   ALT 1958*  --  819*  --  407*  --   --   --   --   ALKPHOS 350*  --  187*  --  147*  --   --   --   --   BILITOT 0.8  --  0.5  --  0.6  --   --   --   --   ALBUMIN 2.4*  < > 1.9*  < > 2.1*  2.1*  < > 1.9* 1.9* 2.1*  < > = values in this interval not displayed. Cardiac Enzymes  Recent Labs Lab 06/11/15 1351 06/11/15 2252 06/12/15 0540  TROPONINI 17.69* 15.54* 12.20*   Glucose  Recent Labs Lab 06/17/15 0002 06/17/15 0212 06/17/15 0354 06/17/15 0356 06/17/15 0428 06/17/15 0734  GLUCAP 60* 79 11*  <10* 200* 137*    CXR: CM, no edema or infiltrates   ASSESSMENT / PLAN:  69 yo female with acute resp failure and acute multiorgan failure(resp,cardiovascualr,renal,liver) from acute strep  pneumonia and cardiogenic/septic shock With COPD  PULMONARY A: Acute resp failure-will attempt another SAT/SBT today when family arrives-failed due to resp muscle fatigue/COPD Chronic COPD without acute bronchospasm P:   Cont full vent support - settings reviewed and/or adjusted-plan for SAT/SBT today Cont vent bundle -follow up ABG and CXR as needed  CARDIOVASCULAR A:  Acute MI-cardiogenic shock -vasopressors as needed CAD - 2VD deemed to not be amenable to revascularization PVD P:  MAP goal > 65 mmHg Cards consultation: no role for cardiac intervention due to MODS, poor baseline -follow up cardiology recs  RENAL A:  AKI Mild metabolic acidosis Mild hyperkalemia P:   Monitor BMET intermittently Monitor I/Os Correct electrolytes as indicated HCO3 gtt initiated 11/13 Renal consultation 11/13-s/p HD vasc cath-started on CRRT-  GASTROINTESTINAL A:   No issues P:   SUP: IV PPI Continue TFs   HEMATOLOGIC A:   Mild anemia without overt bleeding P:  DVT px: SQ heparin Monitor CBC intermittently Transfuse per usual guidelines  INFECTIOUS Strep pneumonia P:   Monitor temp, WBC count   ENDOCRINE A:   Hyperglycemia without documented DM H/O frequent steroid dosing - suspect adrenal insuff P:   Cont SSI Cont stress dose steroids  NEUROLOGIC A:   Severe acute encephalopathy Severe baseline debilitation P:   RASS goal: -1, -2 Cont to address goals of care.    I have personally obtained a history, examined the patient, evaluated Pertinent laboratory and [employer]/imaging results, and  formulated the assessment and plan The Patient requires high complexity decision making for assessment and support, frequent evaluation and titration of therapies, application of  advanced monitoring technologies and extensive interpretation of multiple databases. Critical Care Time devoted to patient care services described in this note is 40 minutes.   Overall, patient is critically ill, prognosis is guarded.  Patient with Multiorgan failure and at high risk for cardiac arrest and death. Will obtain palliative care consult   Corrin Parker, M.D.  [facility] Pulmonary & Critical Care Medicine  Medical Director [place] Director [place] Department       06/17/2015, 9:36 AM

## 2015-06-17 NOTE — Progress Notes (Signed)
MEDICATION RELATED CONSULT NOTE - INITIAL   Pharmacy Consult for Constipation Prevention  Allergies  Allergen Reactions  . Nsaids Nausea And Vomiting and Other (See Comments)    Reaction:  GI distress and burning    Patient Measurements: Height: 5\' 2"  (157.5 cm) Weight: 119 lb 4.3 oz (54.1 kg) IBW/kg (Calculated) : 50.1  Vital Signs: Temp: 96.1 F (35.6 C) (11/18 1300) Temp Source: Core (Comment) (11/18 1300) BP: 127/53 mmHg (11/18 1300) Pulse Rate: 83 (11/18 0800) Intake/Output from previous day: 11/17 0701 - 11/18 0700 In: 3720 [I.V.:1395; NG/GT:1205; IV Piggyback:1120] Out: 1029 [Urine:170; Stool:1] Intake/Output from this shift: Total I/O In: 1410 [I.V.:810; NG/GT:390; IV Piggyback:210] Out: 287 [Other:287]  Labs:  Recent Labs  06/16/15 0520  06/16/15 1725 06/17/15 0011 06/17/15 0112 06/17/15 0339 06/17/15 0946  WBC 20.9*  --   --   --   --  19.0*  --   HGB 7.1*  --   --   --   --  6.8*  --   HCT 23.6*  --   --   --   --  23.7*  --   PLT 88*  --   --   --   --  88*  --   CREATININE 0.94  < > 1.04* 0.93 0.83 0.74  --   MG 1.6*  < >  --  1.6*  --  1.6* 1.6*  PHOS 1.4*  < > 2.1* 2.6 2.5  --   --   ALBUMIN 2.1*  2.1*  < > 1.9* 1.9* 2.1*  --   --   PROT 4.8*  --   --   --   --   --   --   AST 78*  --   --   --   --   --   --   ALT 407*  --   --   --   --   --   --   ALKPHOS 147*  --   --   --   --   --   --   BILITOT 0.6  --   --   --   --   --   --   BILIDIR 0.1  --   --   --   --   --   --   IBILI 0.5  --   --   --   --   --   --   < > = values in this interval not displayed. Estimated Creatinine Clearance: 53.2 mL/min (by C-G formula based on Cr of 0.74).   Microbiology: Recent Results (from the past 720 hour(s))  Culture, blood (routine x 2)     Status: None   Collection Time: 05/21/15 10:49 PM  Result Value Ref Range Status   Specimen Description BLOOD RIGHT HAND  Final   Special Requests BOTTLES DRAWN AEROBIC AND ANAEROBIC 4CC  Final   Culture NO  GROWTH 5 DAYS  Final   Report Status 05/26/2015 FINAL  Final  Culture, blood (routine x 2)     Status: None   Collection Time: 05/21/15 11:00 PM  Result Value Ref Range Status   Specimen Description BLOOD LEFT ASSIST CONTROL  Final   Special Requests BOTTLES DRAWN AEROBIC AND ANAEROBIC 4CC  Final   Culture NO GROWTH 5 DAYS  Final   Report Status 05/26/2015 FINAL  Final  MRSA PCR Screening     Status: None   Collection Time: 05/22/15  1:46 AM  Result Value Ref Range Status   MRSA by PCR NEGATIVE NEGATIVE Final    Comment:        The GeneXpert MRSA Assay (FDA approved for NASAL specimens only), is one component of a comprehensive MRSA colonization surveillance program. It is not intended to diagnose MRSA infection nor to guide or monitor treatment for MRSA infections.   Urine culture     Status: None   Collection Time: 06/10/15  7:30 PM  Result Value Ref Range Status   Specimen Description URINE, RANDOM  Final   Special Requests NONE  Final   Culture NO GROWTH 2 DAYS  Final   Report Status 06/12/2015 FINAL  Final  Blood Culture (routine x 2)     Status: None   Collection Time: 06/10/15  8:10 PM  Result Value Ref Range Status   Specimen Description BLOOD RIGHT  Final   Special Requests BOTTLES DRAWN AEROBIC AND ANAEROBIC 5CC  Final   Culture  Setup Time   Final    GRAM POSITIVE COCCI AEROBIC BOTTLE ONLY CRITICAL RESULT CALLED TO, READ BACK BY AND VERIFIED WITH: RN Kathi Simpers 06/14/15 1358    Culture   Final    COAGULASE NEGATIVE STAPHYLOCOCCUS Results consistent with contamination.    Report Status 06/16/2015 FINAL  Final  Blood Culture (routine x 2)     Status: None   Collection Time: 06/10/15  8:20 PM  Result Value Ref Range Status   Specimen Description BLOOD RIGHT  Final   Special Requests BOTTLES DRAWN AEROBIC AND ANAEROBIC 2CC AERO, 4CC  Final   Culture NO GROWTH 5 DAYS  Final   Report Status 06/15/2015 FINAL  Final  MRSA PCR Screening     Status:  Abnormal   Collection Time: 06/11/15  1:40 AM  Result Value Ref Range Status   MRSA by PCR POSITIVE (A) NEGATIVE Final    Comment:        The GeneXpert MRSA Assay (FDA approved for NASAL specimens only), is one component of a comprehensive MRSA colonization surveillance program. It is not intended to diagnose MRSA infection nor to guide or monitor treatment for MRSA infections. CRITICAL RESULT CALLED TO, READ BACK BY AND VERIFIED WITH: [facility] ALLEN AT [id number] 06/11/15.PMH   Culture, sputum-assessment     Status: None   Collection Time: 06/11/15  4:30 AM  Result Value Ref Range Status   Specimen Description TRACHEAL ASPIRATE  Final   Special Requests NONE  Final   Sputum evaluation THIS SPECIMEN IS ACCEPTABLE FOR SPUTUM CULTURE  Final   Report Status 06/11/2015 FINAL  Final  Culture, respiratory (NON-Expectorated)     Status: None   Collection Time: 06/11/15  4:30 AM  Result Value Ref Range Status   Specimen Description TRACHEAL ASPIRATE  Final   Special Requests NONE Reflexed from [id number]  Final   Gram Stain   Final    GOOD SPECIMEN - 80-90% WBCS MODERATE WBC SEEN MODERATE GRAM POSITIVE COCCI RARE GRAM NEGATIVE RODS    Culture HEAVY GROWTH STREPTOCOCCUS PNEUMONIAE  Final   Report Status 06/13/2015 FINAL  Final   Organism ID, Bacteria STREPTOCOCCUS PNEUMONIAE  Final      Susceptibility   Streptococcus pneumoniae - MIC*    ERYTHROMYCIN <=0.12 SENSITIVE Sensitive     VANCOMYCIN 0.5 SENSITIVE Sensitive     TRIMETH/SULFA <=10 SENSITIVE Sensitive     CLINDAMYCIN <=0.25 SENSITIVE Sensitive     LEVOFLOXACIN Value in next row Sensitive      SENSITIVE1  LINEZOLID Value in next row Sensitive      SENSITIVE<=2    * HEAVY GROWTH STREPTOCOCCUS PNEUMONIAE    Medical History: Past Medical History  Diagnosis Date  . COPD (chronic obstructive pulmonary disease) ([place])   . Hypertension   . Ischemic cardiomyopathy     a.   . Coronary artery disease     a. s/p multiple stenting in  2005  . History of ventricular tachycardia   . PVD (peripheral vascular disease) ([place])   . PAD (peripheral artery disease) ([place])   . HLD (hyperlipidemia)   . Chronic back pain   . MI, old   . MRSA (methicillin resistant staph aureus) culture positive     left foot wound  . CHF (congestive heart failure) ([place])   . Pneumonia   . Asthma   . Cancer [facility])     Assessment: 69 yo female requiring constipation prophylaxis. Pharmacy consulted for management and monitoring.   Plan:  Last BM charted 11/17. Will continue Miralax PRN with docusate/Senna twice daily scheduled. Pharmacy will continue to follow and make adjustments as needed.   Ulice Dash, PharmD

## 2015-06-17 NOTE — Progress Notes (Signed)
Nutrition Follow-up    INTERVENTION:   EN: recommend continuing current TF regimen; if remains on D10, may need to adjust TF regimen to prevent overfeeding. Continue to assess   NUTRITION DIAGNOSIS:   Inadequate oral intake related to inability to eat as evidenced by NPO status. Being addressed via Tf   GOAL:   Patient will meet greater than or equal to 90% of their needs   MONITOR:    (Energy Intake, Pulmonary Profile, Anthropometrics, Gastrointestinal Profile, Electrolyte and renal Profile)  REASON FOR ASSESSMENT:   Ventilator    ASSESSMENT:   Pt admitted after being found unresponsive by family. Pt currently sedated on the vent secondary to septic shock with acute on chronic respiratory failure.   Pt remains on CRRT, started on D10 infusing during the night for hypoglycemia  Diet Order:   NPO  EN: tolerating Vital High Protein at rate of 45 ml/hr  Electrolyte and Renal Profile:  Recent Labs Lab 06/16/15 1725 06/17/15 0011 06/17/15 0112 06/17/15 0339 06/17/15 0946  BUN 18 15 14 14   --   CREATININE 1.04* 0.93 0.83 0.74  --   NA 139 138 141 140  --   K 4.2 4.6 4.8 4.8  --   MG  --  1.6*  --  1.6* 1.6*  PHOS 2.1* 2.6 2.5  --   --    Glucose Profile:  Recent Labs  06/17/15 0953 06/17/15 0955 06/17/15 1152  GLUCAP 65 90 98   Meds: ss novolog, solumedrol started today, D10 at 100 ml/hr (816 kcals)   Height:   Ht Readings from Last 1 Encounters:  06/11/15 5\' 2"  (1.575 m)    Weight:   Wt Readings from Last 1 Encounters:  06/17/15 119 lb 4.3 oz (54.1 kg)    Filed Weights   06/15/15 0540 06/16/15 0500 06/17/15 0528  Weight: 132 lb 4.4 oz (60 kg) 128 lb 1.4 oz (58.1 kg) 119 lb 4.3 oz (54.1 kg)    BMI:  Body mass index is 21.81 kg/(m^2).  Estimated Nutritional Needs:   Kcal:  1173kcals, (BEE: 1032, Ve: 6.4, TMax: 37.1) using current weight of 54.9kg  Protein:  74-98g protein (1.5-2.0g/kg)  Fluid:  1225-1440mL of fluid  (25-63mL/kg)  EDUCATION NEEDS:   Education needs no appropriate at this time  [place], RD, LDN [phone] Pager

## 2015-06-17 NOTE — Progress Notes (Signed)
Pharmacy Consult for Electrolyte Monitoring and management   Allergies  Allergen Reactions  . Nsaids Nausea And Vomiting and Other (See Comments)    Reaction:  GI distress and burning     Patient Measurements: Height: 5\' 2"  (157.5 cm) Weight: 119 lb 4.3 oz (54.1 kg) IBW/kg (Calculated) : 50.1  Vital Signs: Temp: 96.1 F (35.6 C) (11/18 1300) Temp Source: Core (Comment) (11/18 1300) BP: 127/53 mmHg (11/18 1300) Pulse Rate: 83 (11/18 0800) Intake/Output from previous day: 11/17 0701 - 11/18 0700 In: 3720 [I.V.:1395; NG/GT:1205; IV Piggyback:1120] Out: 1029 [Urine:170; Stool:1] Intake/Output from this shift: Total I/O In: 1410 [I.V.:810; NG/GT:390; IV Piggyback:210] Out: 287 [Other:287]  Labs:  Recent Labs  06/16/15 0520 06/17/15 0339  WBC 20.9* 19.0*  HGB 7.1* 6.8*  HCT 23.6* 23.7*  PLT 88* 88*     Recent Labs  06/16/15 0520  06/16/15 1725 06/17/15 0011 06/17/15 0112 06/17/15 0339 06/17/15 0946  NA 140  < > 139 138 141 140  --   K 4.4  < > 4.2 4.6 4.8 4.8  --   CL 106  < > 108 107 111 110  --   CO2 29  < > 28 28 26 25   --   GLUCOSE 130*  < > 120* 104* 101* 101*  --   BUN 18  < > 18 15 14 14   --   CREATININE 0.94  < > 1.04* 0.93 0.83 0.74  --   CALCIUM 8.4*  < > 7.8* 7.8* 7.9* 8.1*  --   MG 1.6*  < >  --  1.6*  --  1.6* 1.6*  PHOS 1.4*  < > 2.1* 2.6 2.5  --   --   PROT 4.8*  --   --   --   --   --   --   ALBUMIN 2.1*  2.1*  < > 1.9* 1.9* 2.1*  --   --   AST 78*  --   --   --   --   --   --   ALT 407*  --   --   --   --   --   --   ALKPHOS 147*  --   --   --   --   --   --   BILITOT 0.6  --   --   --   --   --   --   BILIDIR 0.1  --   --   --   --   --   --   IBILI 0.5  --   --   --   --   --   --   < > = values in this interval not displayed. Estimated Creatinine Clearance: 53.2 mL/min (by C-G formula based on Cr of 0.74).    Recent Labs  06/17/15 0953 06/17/15 0955 06/17/15 1152  GLUCAP 65 90 98    Medical History: Past Medical History   Diagnosis Date  . COPD (chronic obstructive pulmonary disease) ([place])   . Hypertension   . Ischemic cardiomyopathy     a.   . Coronary artery disease     a. s/p multiple stenting in 2005  . History of ventricular tachycardia   . PVD (peripheral vascular disease) ([place])   . PAD (peripheral artery disease) ([place])   . HLD (hyperlipidemia)   . Chronic back pain   . MI, old   . MRSA (methicillin resistant staph aureus) culture positive  left foot wound  . CHF (congestive heart failure) ([place])   . Pneumonia   . Asthma   . Cancer [facility])     Assessment: 69 yo patient who is receiving CRRT and currently intubated with respiratory failure. Pharmacy consulted for monitoring and management of electrolytes.   Magnesium: 1.6  Plan:  Will replace magnesium with 2 grams magnesium sulfate iv once then f/u next labs.   Ulice Dash, PharmD

## 2015-06-17 NOTE — Progress Notes (Signed)
PHARMACY - Consult for CRRT dosing and monitoring  Pharmacy Consult for CRRT dosing and monitoring     Allergies  Allergen Reactions  . Nsaids Nausea And Vomiting and Other (See Comments)    Reaction:  GI distress and burning     Patient Measurements: Height: 5\' 2"  (157.5 cm) Weight: 119 lb 4.3 oz (54.1 kg) IBW/kg (Calculated) : 50.1  Vital Signs: Temp: 96.1 F (35.6 C) (11/18 1300) Temp Source: Core (Comment) (11/18 1300) BP: 127/53 mmHg (11/18 1300) Pulse Rate: 83 (11/18 0800) Intake/Output from previous day: 11/17 0701 - 11/18 0700 In: 3720 [I.V.:1395; NG/GT:1205; IV Piggyback:1120] Out: 1029 [Urine:170; Stool:1] Total I/O In: 1410 [I.V.:810; NG/GT:390; IV Piggyback:210] Out: 287 [Other:287] Vent settings for last 24 hours: Vent Mode:  [-] PRVC FiO2 (%):  [28 %] 28 % Set Rate:  [24 bmp] 24 bmp Vt Set:  [400 mL] 400 mL PEEP:  [5 cmH20] 5 cmH20 Plateau Pressure:  [15 cmH20-17 cmH20] 15 cmH20  Labs:  Recent Labs  06/16/15 0520  06/16/15 1725 06/17/15 0011 06/17/15 0112 06/17/15 0339 06/17/15 0946  WBC 20.9*  --   --   --   --  19.0*  --   HGB 7.1*  --   --   --   --  6.8*  --   HCT 23.6*  --   --   --   --  23.7*  --   PLT 88*  --   --   --   --  88*  --   CREATININE 0.94  < > 1.04* 0.93 0.83 0.74  --   MG 1.6*  < >  --  1.6*  --  1.6* 1.6*  PHOS 1.4*  < > 2.1* 2.6 2.5  --   --   ALBUMIN 2.1*  2.1*  < > 1.9* 1.9* 2.1*  --   --   PROT 4.8*  --   --   --   --   --   --   AST 78*  --   --   --   --   --   --   ALT 407*  --   --   --   --   --   --   ALKPHOS 147*  --   --   --   --   --   --   BILITOT 0.6  --   --   --   --   --   --   BILIDIR 0.1  --   --   --   --   --   --   IBILI 0.5  --   --   --   --   --   --   < > = values in this interval not displayed. Estimated Creatinine Clearance: 53.2 mL/min (by C-G formula based on Cr of 0.74).   Recent Labs  06/17/15 0953 06/17/15 0955 06/17/15 1152  GLUCAP 65 90 98    Microbiology: Recent  Results (from the past 720 hour(s))  Culture, blood (routine x 2)     Status: None   Collection Time: 05/21/15 10:49 PM  Result Value Ref Range Status   Specimen Description BLOOD RIGHT HAND  Final   Special Requests BOTTLES DRAWN AEROBIC AND ANAEROBIC 4CC  Final   Culture NO GROWTH 5 DAYS  Final   Report Status 05/26/2015 FINAL  Final  Culture, blood (routine x 2)     Status: None   Collection Time: 05/21/15  11:00 PM  Result Value Ref Range Status   Specimen Description BLOOD LEFT ASSIST CONTROL  Final   Special Requests BOTTLES DRAWN AEROBIC AND ANAEROBIC 4CC  Final   Culture NO GROWTH 5 DAYS  Final   Report Status 05/26/2015 FINAL  Final  MRSA PCR Screening     Status: None   Collection Time: 05/22/15  1:46 AM  Result Value Ref Range Status   MRSA by PCR NEGATIVE NEGATIVE Final    Comment:        The GeneXpert MRSA Assay (FDA approved for NASAL specimens only), is one component of a comprehensive MRSA colonization surveillance program. It is not intended to diagnose MRSA infection nor to guide or monitor treatment for MRSA infections.   Urine culture     Status: None   Collection Time: 06/10/15  7:30 PM  Result Value Ref Range Status   Specimen Description URINE, RANDOM  Final   Special Requests NONE  Final   Culture NO GROWTH 2 DAYS  Final   Report Status 06/12/2015 FINAL  Final  Blood Culture (routine x 2)     Status: None   Collection Time: 06/10/15  8:10 PM  Result Value Ref Range Status   Specimen Description BLOOD RIGHT  Final   Special Requests BOTTLES DRAWN AEROBIC AND ANAEROBIC 5CC  Final   Culture  Setup Time   Final    GRAM POSITIVE COCCI AEROBIC BOTTLE ONLY CRITICAL RESULT CALLED TO, READ BACK BY AND VERIFIED WITH: RN Kathi Simpers 06/14/15 1358    Culture   Final    COAGULASE NEGATIVE STAPHYLOCOCCUS Results consistent with contamination.    Report Status 06/16/2015 FINAL  Final  Blood Culture (routine x 2)     Status: None   Collection  Time: 06/10/15  8:20 PM  Result Value Ref Range Status   Specimen Description BLOOD RIGHT  Final   Special Requests BOTTLES DRAWN AEROBIC AND ANAEROBIC 2CC AERO, 4CC  Final   Culture NO GROWTH 5 DAYS  Final   Report Status 06/15/2015 FINAL  Final  MRSA PCR Screening     Status: Abnormal   Collection Time: 06/11/15  1:40 AM  Result Value Ref Range Status   MRSA by PCR POSITIVE (A) NEGATIVE Final    Comment:        The GeneXpert MRSA Assay (FDA approved for NASAL specimens only), is one component of a comprehensive MRSA colonization surveillance program. It is not intended to diagnose MRSA infection nor to guide or monitor treatment for MRSA infections. CRITICAL RESULT CALLED TO, READ BACK BY AND VERIFIED WITH: [facility] ALLEN AT [id number] 06/11/15.PMH   Culture, sputum-assessment     Status: None   Collection Time: 06/11/15  4:30 AM  Result Value Ref Range Status   Specimen Description TRACHEAL ASPIRATE  Final   Special Requests NONE  Final   Sputum evaluation THIS SPECIMEN IS ACCEPTABLE FOR SPUTUM CULTURE  Final   Report Status 06/11/2015 FINAL  Final  Culture, respiratory (NON-Expectorated)     Status: None   Collection Time: 06/11/15  4:30 AM  Result Value Ref Range Status   Specimen Description TRACHEAL ASPIRATE  Final   Special Requests NONE Reflexed from [id number]  Final   Gram Stain   Final    GOOD SPECIMEN - 80-90% WBCS MODERATE WBC SEEN MODERATE GRAM POSITIVE COCCI RARE GRAM NEGATIVE RODS    Culture HEAVY GROWTH STREPTOCOCCUS PNEUMONIAE  Final   Report Status 06/13/2015 FINAL  Final   Organism ID,  Bacteria STREPTOCOCCUS PNEUMONIAE  Final      Susceptibility   Streptococcus pneumoniae - MIC*    ERYTHROMYCIN <=0.12 SENSITIVE Sensitive     VANCOMYCIN 0.5 SENSITIVE Sensitive     TRIMETH/SULFA <=10 SENSITIVE Sensitive     CLINDAMYCIN <=0.25 SENSITIVE Sensitive     LEVOFLOXACIN Value in next row Sensitive      SENSITIVE1    LINEZOLID Value in next row Sensitive       SENSITIVE<=2    * HEAVY GROWTH STREPTOCOCCUS PNEUMONIAE    Assessment: No medications require adjustment for CRRT at present.   Plan:  Pharmacy will continue to monitor patient medications and make adjustments as need for appropriate CRRT dosing.   Ulice Dash, PharmD

## 2015-06-17 NOTE — Progress Notes (Signed)
RN spoke with Dr. Holley Raring and made MD aware that patient's blood pressure is 83/41 with MAP of 54 and that when patient is adequately sedated where she is not restless and is not working to breath then her blood pressure drops. MD gave order for levophed drip.

## 2015-06-18 LAB — RENAL FUNCTION PANEL
ALBUMIN: 1.8 g/dL — AB (ref 3.5–5.0)
ALBUMIN: 2.1 g/dL — AB (ref 3.5–5.0)
ANION GAP: 5 (ref 5–15)
ANION GAP: 4 — AB (ref 5–15)
Albumin: 1.8 g/dL — ABNORMAL LOW (ref 3.5–5.0)
Albumin: 2.1 g/dL — ABNORMAL LOW (ref 3.5–5.0)
Albumin: 2.3 g/dL — ABNORMAL LOW (ref 3.5–5.0)
Anion gap: 2 — ABNORMAL LOW (ref 5–15)
Anion gap: 2 — ABNORMAL LOW (ref 5–15)
Anion gap: 4 — ABNORMAL LOW (ref 5–15)
BUN: 13 mg/dL (ref 6–20)
BUN: 14 mg/dL (ref 6–20)
BUN: 14 mg/dL (ref 6–20)
BUN: 14 mg/dL (ref 6–20)
BUN: 13 mg/dL (ref 6–20)
CALCIUM: 7.8 mg/dL — AB (ref 8.9–10.3)
CALCIUM: 7.9 mg/dL — AB (ref 8.9–10.3)
CALCIUM: 8 mg/dL — AB (ref 8.9–10.3)
CHLORIDE: 105 mmol/L (ref 101–111)
CHLORIDE: 105 mmol/L (ref 101–111)
CHLORIDE: 102 mmol/L (ref 101–111)
CHLORIDE: 103 mmol/L (ref 101–111)
CO2: 29 mmol/L (ref 22–32)
CO2: 29 mmol/L (ref 22–32)
CO2: 27 mmol/L (ref 22–32)
CO2: 27 mmol/L (ref 22–32)
CO2: 28 mmol/L (ref 22–32)
CREATININE: 0.76 mg/dL (ref 0.44–1.00)
CREATININE: 0.86 mg/dL (ref 0.44–1.00)
CREATININE: 0.8 mg/dL (ref 0.44–1.00)
CREATININE: 0.79 mg/dL (ref 0.44–1.00)
Calcium: 7.5 mg/dL — ABNORMAL LOW (ref 8.9–10.3)
Calcium: 7.8 mg/dL — ABNORMAL LOW (ref 8.9–10.3)
Chloride: 103 mmol/L (ref 101–111)
Creatinine, Ser: 0.89 mg/dL (ref 0.44–1.00)
GFR calc Af Amer: >60 mL/min (ref >60)
GFR calc Af Amer: >60 mL/min (ref >60)
GFR calc Af Amer: >60 mL/min (ref >60)
GFR calc non Af Amer: >60 mL/min (ref >60)
GFR calc non Af Amer: >60 mL/min (ref >60)
GLUCOSE: 122 mg/dL — AB (ref 65–99)
GLUCOSE: 171 mg/dL — AB (ref 65–99)
GLUCOSE: 127 mg/dL — AB (ref 65–99)
Glucose, Bld: 142 mg/dL — ABNORMAL HIGH (ref 65–99)
Glucose, Bld: 188 mg/dL — ABNORMAL HIGH (ref 65–99)
PHOSPHORUS: 1.3 mg/dL — AB (ref 2.5–4.6)
PHOSPHORUS: 1.8 mg/dL — AB (ref 2.5–4.6)
PHOSPHORUS: 3.6 mg/dL (ref 2.5–4.6)
POTASSIUM: 4.3 mmol/L (ref 3.5–5.1)
Phosphorus: 1.6 mg/dL — ABNORMAL LOW (ref 2.5–4.6)
Phosphorus: 2.7 mg/dL (ref 2.5–4.6)
Potassium: 4.4 mmol/L (ref 3.5–5.1)
Potassium: 4.3 mmol/L (ref 3.5–5.1)
Potassium: 4.6 mmol/L (ref 3.5–5.1)
Potassium: 4.4 mmol/L (ref 3.5–5.1)
SODIUM: 136 mmol/L (ref 135–145)
SODIUM: 135 mmol/L (ref 135–145)
Sodium: 136 mmol/L (ref 135–145)
Sodium: 133 mmol/L — ABNORMAL LOW (ref 135–145)
Sodium: 135 mmol/L (ref 135–145)

## 2015-06-18 LAB — BLOOD GAS, ARTERIAL
ALLENS TEST (PASS/FAIL): POSITIVE — AB
Acid-Base Excess: 2 mmol/L (ref 0.0–3.0)
Bicarbonate: 27.8 mEq/L (ref 21.0–28.0)
FIO2: 0.28
LHR: 24 {breaths}/min
MECHVT: 400 mL
O2 SAT: 97.9 %
PATIENT TEMPERATURE: 37
PCO2 ART: 47 mmHg (ref 32.0–48.0)
PEEP: 5 cmH2O
PH ART: 7.38 (ref 7.350–7.450)
PO2 ART: 105 mmHg (ref 83.0–108.0)

## 2015-06-18 LAB — CBC
HCT: 23.4 % — ABNORMAL LOW (ref 35.0–47.0)
HEMOGLOBIN: 6.9 g/dL — AB (ref 12.0–16.0)
MCH: 20.7 pg — ABNORMAL LOW (ref 26.0–34.0)
MCHC: 29.6 g/dL — AB (ref 32.0–36.0)
MCV: 69.9 fL — ABNORMAL LOW (ref 80.0–100.0)
PLATELETS: 83 10*3/uL — AB (ref 150–440)
RBC: 3.34 MIL/uL — ABNORMAL LOW (ref 3.80–5.20)
RDW: 23.5 % — AB (ref 11.5–14.5)
WBC: 20.4 10*3/uL — AB (ref 3.6–11.0)

## 2015-06-18 LAB — HEPARIN INDUCED PLATELET AB (HIT ANTIBODY): Heparin Induced Plt Ab: 0.568 OD — ABNORMAL HIGH (ref 0.000–0.400)

## 2015-06-18 LAB — MAGNESIUM
MAGNESIUM: 1.8 mg/dL (ref 1.7–2.4)
MAGNESIUM: 1.7 mg/dL (ref 1.7–2.4)
Magnesium: 1.8 mg/dL (ref 1.7–2.4)
Magnesium: 1.7 mg/dL (ref 1.7–2.4)
Magnesium: 1.8 mg/dL (ref 1.7–2.4)
Magnesium: 1.8 mg/dL (ref 1.7–2.4)

## 2015-06-18 LAB — BASIC METABOLIC PANEL
ANION GAP: 3 — AB (ref 5–15)
BUN: 13 mg/dL (ref 6–20)
CALCIUM: 8 mg/dL — AB (ref 8.9–10.3)
CO2: 29 mmol/L (ref 22–32)
Chloride: 104 mmol/L (ref 101–111)
Creatinine, Ser: 0.84 mg/dL (ref 0.44–1.00)
Glucose, Bld: 106 mg/dL — ABNORMAL HIGH (ref 65–99)
POTASSIUM: 4.5 mmol/L (ref 3.5–5.1)
SODIUM: 136 mmol/L (ref 135–145)

## 2015-06-18 LAB — HEMOGLOBIN AND HEMATOCRIT, BLOOD
HCT: 28.5 % — ABNORMAL LOW (ref 35.0–47.0)
Hemoglobin: 8.7 g/dL — ABNORMAL LOW (ref 12.0–16.0)

## 2015-06-18 LAB — GLUCOSE, CAPILLARY
GLUCOSE-CAPILLARY: 102 mg/dL — AB (ref 65–99)
GLUCOSE-CAPILLARY: 87 mg/dL (ref 65–99)
GLUCOSE-CAPILLARY: 148 mg/dL — AB (ref 65–99)
Glucose-Capillary: 112 mg/dL — ABNORMAL HIGH (ref 65–99)
Glucose-Capillary: 154 mg/dL — ABNORMAL HIGH (ref 65–99)
Glucose-Capillary: 84 mg/dL (ref 65–99)

## 2015-06-18 LAB — PREPARE RBC (CROSSMATCH)

## 2015-06-18 MED ORDER — HYDROMORPHONE HCL 1 MG/ML IJ SOLN
1.0000 mg | INTRAMUSCULAR | Status: DC | PRN
Start: 2015-06-18 — End: 2015-06-19
  Administered 2015-06-19: 1 mg via INTRAVENOUS
  Filled 2015-06-18: qty 1

## 2015-06-18 MED ORDER — SODIUM CHLORIDE 0.9 % IV SOLN
Freq: Once | INTRAVENOUS | Status: AC
  Administered 2015-06-18: 10:00:00 via INTRAVENOUS

## 2015-06-18 MED ORDER — MAGNESIUM SULFATE IN D5W 10-5 MG/ML-% IV SOLN
1.0000 g | Freq: Once | INTRAVENOUS | Status: AC
  Administered 2015-06-18: 1 g via INTRAVENOUS
  Filled 2015-06-18: qty 100

## 2015-06-18 MED ORDER — SODIUM PHOSPHATE 3 MMOLE/ML IV SOLN
30.0000 mmol | Freq: Once | INTRAVENOUS | Status: AC
  Administered 2015-06-18: 30 mmol via INTRAVENOUS
  Filled 2015-06-18: qty 10

## 2015-06-18 MED ORDER — ALTEPLASE 2 MG IJ SOLR
2.0000 mg | Freq: Once | INTRAMUSCULAR | Status: AC
  Administered 2015-06-18: 2 mg
  Filled 2015-06-18: qty 2

## 2015-06-18 MED ORDER — MIDAZOLAM HCL 2 MG/2ML IJ SOLN
2.0000 mg | INTRAMUSCULAR | Status: DC | PRN
  Administered 2015-06-18 – 2015-06-19 (×7): 2 mg via INTRAVENOUS
  Filled 2015-06-18 (×7): qty 2

## 2015-06-18 MED ORDER — SENNOSIDES 8.8 MG/5ML PO SYRP
5.0000 mL | ORAL_SOLUTION | Freq: Two times a day (BID) | ORAL | Status: DC
  Filled 2015-06-18 (×3): qty 5

## 2015-06-18 MED ORDER — SODIUM CHLORIDE 0.9 % IV SOLN: Freq: Once | INTRAVENOUS | Status: DC

## 2015-06-18 MED ORDER — DEXTROSE 50 % IV SOLN
50.0000 mL | Freq: Once | INTRAVENOUS | Status: AC
  Administered 2015-06-19: 50 mL via INTRAVENOUS
  Filled 2015-06-18: qty 50

## 2015-06-18 MED ORDER — FERROUS SULFATE 220 (44 FE) MG/5ML PO ELIX
220.0000 mg | ORAL_SOLUTION | Freq: Two times a day (BID) | ORAL | Status: DC
  Administered 2015-06-18 – 2015-06-19 (×2): 220 mg
  Filled 2015-06-18 (×5): qty 5

## 2015-06-18 NOTE — Progress Notes (Signed)
[facility]  ROUNDING NOTE   Subjective:  Pt's care discussed with nursing. Hgb dropped to 6.9.  Remains oliguric. CRRT machine alarming due to pressures. Will likely need to use activase again this AM. Overall remains critically ill.  Objective:  Vital signs in last 24 hours:  Temp:  [95.5 F (35.3 C)-98.8 F (37.1 C)] 98.2 F (36.8 C) (11/19 0800) Pulse Rate:  [63-101] 86 (11/19 0800) Resp:  [20-28] 25 (11/19 0800) BP: (75-144)/(38-91) 137/62 mmHg (11/19 0800) SpO2:  [76 %-100 %] 100 % (11/19 0823) FiO2 (%):  [28 %] 28 % (11/19 0827) Weight:  [62.3 kg (137 lb 5.6 oz)] 62.3 kg (137 lb 5.6 oz) (11/19 0400)  Weight change: 8.2 kg (18 lb 1.3 oz) Filed Weights   06/16/15 0500 06/17/15 0528 06/18/15 0400  Weight: 58.1 kg (128 lb 1.4 oz) 54.1 kg (119 lb 4.3 oz) 62.3 kg (137 lb 5.6 oz)    Intake/Output: I/O last 3 completed shifts: In: 6747.4 [I.V.:4407.2; NG/GT:2030.3; IV Piggyback:310] Out: 2049 [Urine:365; FP:[id number]; Stool:1]   Intake/Output this shift:     Physical Exam: General: Critically ill appearing  Head: Healing wound on forehead ETT in place  Eyes: Eyes open  Neck: Supple, trachea midline  Lungs:  Bilateral rhonchi, vent assisted  Heart: S1S2 no rubs  Abdomen:  Soft, nontender, BS present   Extremities:  trace peripheral edema.  Neurologic: Awake this AM, moving all 4 extremeties spontaneously  Skin: Large forehead scar from prior skin cancer       Basic Metabolic Panel:  Recent Labs Lab 06/17/15 0946 06/17/15 0947 06/17/15 1323 06/17/15 1807 06/17/15 2120 06/18/15 0144 06/18/15 0546  NA  --  138 137 136 135 136 136  K  --  4.6 4.5 4.4 4.7 4.4 4.5  CL  --  107 106 105 104 105 104  CO2  --  27 28 28 27 29 29   GLUCOSE  --  111* 125* 175* 184* 142* 106*  BUN  --  12 12 11 13 13 13   CREATININE  --  0.72 0.78 0.75 0.76 0.76 0.84  CALCIUM  --  7.8* 7.6* 7.6* 7.8* 7.8* 8.0*  MG 1.6*  --   --  2.2 1.9 1.8 1.8  PHOS  --  2.3* 2.1*  1.5* 1.5* 1.3*  --     Liver Function Tests:  Recent Labs Lab 06/12/15 0330  06/14/15 0543  06/16/15 0520  06/17/15 0947 06/17/15 1323 06/17/15 1807 06/17/15 2120 06/18/15 0144  AST 2031*  --  280*  --  78*  --   --   --   --   --   --   ALT 1958*  --  819*  --  407*  --   --   --   --   --   --   ALKPHOS 350*  --  187*  --  147*  --   --   --   --   --   --   BILITOT 0.8  --  0.5  --  0.6  --   --   --   --   --   --   PROT 5.3*  --  4.7*  --  4.8*  --   --   --   --   --   --   ALBUMIN 2.4*  < > 1.9*  < > 2.1*  2.1*  < > 1.9* 2.0* 1.7* 2.0* 1.8*  < > = values in this  interval not displayed. No results for input(s): LIPASE, AMYLASE in the last 168 hours. No results for input(s): AMMONIA in the last 168 hours.  CBC:  Recent Labs Lab 06/13/15 2057 06/14/15 0543 06/16/15 0520 06/17/15 0339 06/18/15 0546  WBC 24.5* 26.7* 20.9* 19.0* 20.4*  NEUTROABS 22.7*  --   --   --   --   HGB 7.4* 7.5* 7.1* 6.8* 6.9*  HCT 24.8* 25.3* 23.6* 23.7* 23.4*  MCV 65.3* 65.5* 66.7* 68.7* 69.9*  PLT 124* 105* 88* 88* 83*    Cardiac Enzymes:  Recent Labs Lab 06/11/15 1351 06/11/15 2252 06/12/15 0540  TROPONINI 17.69* 15.54* 12.20*    BNP: Invalid input(s): POCBNP  CBG:  Recent Labs Lab 06/17/15 1607 06/17/15 1932 06/18/15 0022 06/18/15 0426 06/18/15 0652  GLUCAP 156* 114* 154* 102* 74    Microbiology: Results for orders placed or performed during the hospital encounter of 06/10/15  Urine culture     Status: None   Collection Time: 06/10/15  7:30 PM  Result Value Ref Range Status   Specimen Description URINE, RANDOM  Final   Special Requests NONE  Final   Culture NO GROWTH 2 DAYS  Final   Report Status 06/12/2015 FINAL  Final  Blood Culture (routine x 2)     Status: None   Collection Time: 06/10/15  8:10 PM  Result Value Ref Range Status   Specimen Description BLOOD RIGHT  Final   Special Requests BOTTLES DRAWN AEROBIC AND ANAEROBIC 5CC  Final   Culture  Setup  Time   Final    GRAM POSITIVE COCCI AEROBIC BOTTLE ONLY CRITICAL RESULT CALLED TO, READ BACK BY AND VERIFIED WITH: RN Kathi Simpers 06/14/15 1358    Culture   Final    COAGULASE NEGATIVE STAPHYLOCOCCUS Results consistent with contamination.    Report Status 06/16/2015 FINAL  Final  Blood Culture (routine x 2)     Status: None   Collection Time: 06/10/15  8:20 PM  Result Value Ref Range Status   Specimen Description BLOOD RIGHT  Final   Special Requests BOTTLES DRAWN AEROBIC AND ANAEROBIC 2CC AERO, 4CC  Final   Culture NO GROWTH 5 DAYS  Final   Report Status 06/15/2015 FINAL  Final  MRSA PCR Screening     Status: Abnormal   Collection Time: 06/11/15  1:40 AM  Result Value Ref Range Status   MRSA by PCR POSITIVE (A) NEGATIVE Final    Comment:        The GeneXpert MRSA Assay (FDA approved for NASAL specimens only), is one component of a comprehensive MRSA colonization surveillance program. It is not intended to diagnose MRSA infection nor to guide or monitor treatment for MRSA infections. CRITICAL RESULT CALLED TO, READ BACK BY AND VERIFIED WITH: [facility] ALLEN AT [id number] 06/11/15.PMH   Culture, sputum-assessment     Status: None   Collection Time: 06/11/15  4:30 AM  Result Value Ref Range Status   Specimen Description TRACHEAL ASPIRATE  Final   Special Requests NONE  Final   Sputum evaluation THIS SPECIMEN IS ACCEPTABLE FOR SPUTUM CULTURE  Final   Report Status 06/11/2015 FINAL  Final  Culture, respiratory (NON-Expectorated)     Status: None   Collection Time: 06/11/15  4:30 AM  Result Value Ref Range Status   Specimen Description TRACHEAL ASPIRATE  Final   Special Requests NONE Reflexed from [id number]  Final   Gram Stain   Final    GOOD SPECIMEN - 80-90% WBCS MODERATE WBC SEEN MODERATE GRAM  POSITIVE COCCI RARE GRAM NEGATIVE RODS    Culture HEAVY GROWTH STREPTOCOCCUS PNEUMONIAE  Final   Report Status 06/13/2015 FINAL  Final   Organism ID, Bacteria STREPTOCOCCUS  PNEUMONIAE  Final      Susceptibility   Streptococcus pneumoniae - MIC*    ERYTHROMYCIN <=0.12 SENSITIVE Sensitive     VANCOMYCIN 0.5 SENSITIVE Sensitive     TRIMETH/SULFA <=10 SENSITIVE Sensitive     CLINDAMYCIN <=0.25 SENSITIVE Sensitive     LEVOFLOXACIN Value in next row Sensitive      SENSITIVE1    LINEZOLID Value in next row Sensitive      SENSITIVE<=2    * HEAVY GROWTH STREPTOCOCCUS PNEUMONIAE    Coagulation Studies: No results for input(s): LABPROT, INR in the last 72 hours.  Urinalysis: No results for input(s): COLORURINE, LABSPEC, PHURINE, GLUCOSEU, HGBUR, BILIRUBINUR, KETONESUR, PROTEINUR, UROBILINOGEN, NITRITE, LEUKOCYTESUR in the last 72 hours.  Invalid input(s): APPERANCEUR    Imaging: No results found.   Medications:   . dextrose 100 mL/hr at 06/18/15 0838  . feeding supplement (VITAL HIGH PROTEIN) 1,000 mL (06/17/15 1810)  . fentaNYL infusion INTRAVENOUS 400 mcg/hr (06/18/15 0311)  . norepinephrine (LEVOPHED) Adult infusion    . pureflow 1,500 mL (06/18/15 0308)   . sodium chloride   Intravenous Once  . antiseptic oral rinse  7 mL Mouth Rinse QID  . aspirin  81 mg Per Tube Daily  . budesonide  0.25 mg Nebulization BID  . cefTRIAXone (ROCEPHIN)  IV  2 g Intravenous Q12H  . chlorhexidine gluconate  15 mL Mouth Rinse BID  . ferrous sulfate  325 mg Oral BID WC  . free water  30 mL Per Tube 6 times per day  . insulin aspart  0-15 Units Subcutaneous 6 times per day  . ipratropium-albuterol  3 mL Nebulization Q6H  . iron sucrose  200 mg Intravenous Q24H  . methylPREDNISolone (SOLU-MEDROL) injection  40 mg Intravenous Daily  . pantoprazole (PROTONIX) IV  40 mg Intravenous Q24H  . senna-docusate  1 tablet Oral BID   acetaminophen **OR** acetaminophen, heparin, HYDROmorphone, LORazepam, polyethylene glycol, polyvinyl alcohol  Assessment/ Plan:  69 y.o. female  with a PMHx of COPD, hypertension, ischemic cardiomyopathy with multiple coronary artery stents,  history of ventricular tachycardia, peripheral vascular disease, hyperlipidemia, chronic back pain, history of congestive heart failure, who was admitted to [facility] on 06/10/2015 for evaluation of respiratory failure.   1. Acute renal failure secondary to acute tubular necrosis. Patient presented with multiorgan failure upon presentation. Baseline creatinine 0.75. -Critical illness persists, UOP remains in oliguric range, therefore will continue CRRT at this point in time, will increase UF rate to 100cc/hr.  Activase to be instilled in catheter this AM.    2. Hyperkalemia. K well controlled on 4K bath, will continue.  3. Acute respiratory failure. Has failed SBT x 3, unlikely to be come off of vent.  Continue vent support until family makes a decision regarding additional options.  4. Acute myocardial infarction.  Management per pulmonary critical care and hospitalist.   5.  Anemia unspecified: hgb 6.9 at present, pt to receive prbc transfusion today.  LOS: 8 Lynn Butler 11/19/20168:42 AM

## 2015-06-18 NOTE — Progress Notes (Signed)
Restarted crrt

## 2015-06-18 NOTE — Progress Notes (Signed)
[place] [facility] [place] NAME: Lynn Butler    MR#:  [id number]  DATE OF BIRTH:  March 29, 1946  SUBJECTIVE:   - admitted for septic shock, remains on a vent. Failed SBT for the past 2-3 days due to desaturation and tachycardia - remains oliguric and on CRRT.  Critically ill.  Hg. Today 6.9 despite 1 unit of transfusion and will given another unit of blood today.   REVIEW OF SYSTEMS:  Review of Systems  Unable to perform ROS: critical illness    DRUG ALLERGIES:   Allergies  Allergen Reactions  . Nsaids Nausea And Vomiting and Other (See Comments)    Reaction:  GI distress and burning     VITALS:  Blood pressure 133/58, pulse 85, temperature 97.5 F (36.4 C), temperature source Core (Comment), resp. rate 15, height 5\' 2"  (1.575 m), weight 62.3 kg (137 lb 5.6 oz), SpO2 100 %.  PHYSICAL EXAMINATION:  Physical Exam  GENERAL:  69 y.o.-year-old patient lying in the bed critically ill and intubated & sedated.  EYES: Pupils equal, round, reactive to light. No scleral icterus. HEENT: Head atraumatic, normocephalic.  Forehead mass has been removed, s/p plastic surgery and skin graft in place. Oropharynx with ET tube, OG tube.  NECK:  Supple, no jugular venous distention. No thyroid enlargement, no tenderness.  Triple lumen catheter on left ide of neck LUNGS: Normal breath sounds bilaterally, no wheezing, rales,rhonchi or crepitation today. No use of accessory muscles of respiration. CARDIOVASCULAR: S1, S2 normal. No murmurs, rubs, or gallops.  ABDOMEN: Soft, nontender, nondistended. Bowel sounds present. No organomegaly or mass.  EXTREMITIES: No pedal edema, cyanosis, or clubbing. Feeble dorsalis pedis pulse palpable on right foot and dopplerable on left foot.  Dialysis catheter in left groin NEUROLOGIC: Sedated and intubated. Opens eyes but does not follow any commands.  PSYCHIATRIC: Sedated and intubated  SKIN: forehead skin graft in  place.    LABORATORY PANEL:   CBC  Recent Labs Lab 06/18/15 0546 06/18/15 1335  WBC 20.4*  --   HGB 6.9* 8.7*  HCT 23.4* 28.5*  PLT 83*  --    ------------------------------------------------------------------------------------------------------------------  Chemistries   Recent Labs Lab 06/16/15 0520  06/18/15 1335  NA 140  < > 135  K 4.4  < > 4.6  CL 106  < > 103  CO2 29  < > 27  GLUCOSE 130*  < > 171*  BUN 18  < > 14  CREATININE 0.94  < > 0.89  CALCIUM 8.4*  < > 7.9*  MG 1.6*  < > 1.7  AST 78*  --   --   ALT 407*  --   --   ALKPHOS 147*  --   --   BILITOT 0.6  --   --   < > = values in this interval not displayed. ------------------------------------------------------------------------------------------------------------------  Cardiac Enzymes  Recent Labs Lab 06/12/15 0540  TROPONINI 12.20*   ------------------------------------------------------------------------------------------------------------------  RADIOLOGY:  No results found.  ASSESSMENT AND PLAN:   69y/oF with chronic COPD on home o2, multiple abdominal surgeries for obstruction, CAD, HTN, CHF, PAD admitted for septic shock  1. Septic shock- due to pneumonia - blood cultures negative, sputum cultures with streptococcus pneumoniae - cont. rocephin - hemoDynamically stable and afebrile.  2. Acute respiratory failure- due to pneumonia, COPD Exacerbation - Currently intubated and difficult to wean.  Continue care as per pulmonary. -Continue Pulmicort nebs, IV steroids, DuoNeb's  3. ARF- with oliguria, cr normalized,  still with minimal urine output - cont. on CRRT, appreciate nephrology consult  4. Elevated troponins- NSTEMI, known severe 2 vessel disease in past with no chance for revascularization - appreciate cards consult - no active symptoms for now  5. Acute on chronic anemia- severe iron deficiency.   - s/p IV iron for 3 days and oral iron supplements - Hg.Today 6.9 and will  transfuse 1 more units of blood and follow hg.   6. Chronic pain syndrome- always in pain, follows with pain management clinic and on pain contract - will add IV dilaudid PRN as pt. Cannot take PO as she is intubated.   7. Nutrition- cont. tube feeds  Prognosis is poor and palliative care consult to discuss goals of care.    All the records are reviewed and case discussed with Care Management/Social Workerr. Management plans discussed with the patient, family and they are in agreement.  CODE STATUS: Full Code  TOTAL CRITICAL CARE TIME SPENT IN TAKING CARE OF THIS PATIENT: 30 minutes.   POSSIBLE D/C IN 3-4 DAYS, DEPENDING ON CLINICAL CONDITION.   Henreitta Leber M.D on 06/18/2015 at 3:08 PM  Between 7am to 6pm - Pager - [phone]  After 6pm go to [email or web address] - password [email or web address] [facility]  [place] [facility]  Office  [phone]  CC: Primary care physician; Juanell Fairly, MD

## 2015-06-18 NOTE — Progress Notes (Signed)
crrt clotted, will activase and restart, lateef aware

## 2015-06-18 NOTE — Progress Notes (Signed)
PHARMACY - Consult for CRRT dosing and monitoring  Pharmacy Consult for CRRT dosing and monitoring     Allergies  Allergen Reactions  . Nsaids Nausea And Vomiting and Other (See Comments)    Reaction:  GI distress and burning     Patient Measurements: Height: 5\' 2"  (157.5 cm) Weight: 137 lb 5.6 oz (62.3 kg) IBW/kg (Calculated) : 50.1  Vital Signs: Temp: 97.5 F (36.4 C) (11/19 1400) BP: 133/58 mmHg (11/19 1400) Pulse Rate: 85 (11/19 1400) Intake/Output from previous day: 11/18 0701 - 11/19 0700 In: 4987.2 [I.V.:3377.2; NG/GT:1350; IV Piggyback:260] Out: 1398 [Urine:290; Stool:1] Total I/O In: [id number] [I.V.:1000; NG/GT:375; IV Piggyback:210] Out: 321 [Urine:70; Other:251] Vent settings for last 24 hours: Vent Mode:  [-] PRVC FiO2 (%):  [28 %] 28 % Set Rate:  [24 bmp] 24 bmp Vt Set:  [400 mL] 400 mL PEEP:  [5 cmH20] 5 cmH20  Labs:  Recent Labs  06/16/15 0520  06/17/15 0339  06/17/15 2120 06/18/15 0144 06/18/15 0546 06/18/15 0904  WBC 20.9*  --  19.0*  --   --   --  20.4*  --   HGB 7.1*  --  6.8*  --   --   --  6.9*  --   HCT 23.6*  --  23.7*  --   --   --  23.4*  --   PLT 88*  --  88*  --   --   --  83*  --   CREATININE 0.94  < > 0.74  < > 0.76 0.76 0.84 0.86  MG 1.6*  < > 1.6*  < > 1.9 1.8 1.8 1.7  PHOS 1.4*  < >  --   < > 1.5* 1.3*  --  1.6*  ALBUMIN 2.1*  2.1*  < >  --   < > 2.0* 1.8*  --  1.8*  PROT 4.8*  --   --   --   --   --   --   --   AST 78*  --   --   --   --   --   --   --   ALT 407*  --   --   --   --   --   --   --   ALKPHOS 147*  --   --   --   --   --   --   --   BILITOT 0.6  --   --   --   --   --   --   --   BILIDIR 0.1  --   --   --   --   --   --   --   IBILI 0.5  --   --   --   --   --   --   --   < > = values in this interval not displayed. Estimated Creatinine Clearance: 54.4 mL/min (by C-G formula based on Cr of 0.86).   Recent Labs  06/18/15 0426 06/18/15 0652 06/18/15 1115  GLUCAP 102* 87 148*    Microbiology: Recent  Results (from the past 720 hour(s))  Culture, blood (routine x 2)     Status: None   Collection Time: 05/21/15 10:49 PM  Result Value Ref Range Status   Specimen Description BLOOD RIGHT HAND  Final   Special Requests BOTTLES DRAWN AEROBIC AND ANAEROBIC 4CC  Final   Culture NO GROWTH 5 DAYS  Final   Report Status 05/26/2015 FINAL  Final  Culture, blood (routine x 2)     Status: None   Collection Time: 05/21/15 11:00 PM  Result Value Ref Range Status   Specimen Description BLOOD LEFT ASSIST CONTROL  Final   Special Requests BOTTLES DRAWN AEROBIC AND ANAEROBIC 4CC  Final   Culture NO GROWTH 5 DAYS  Final   Report Status 05/26/2015 FINAL  Final  MRSA PCR Screening     Status: None   Collection Time: 05/22/15  1:46 AM  Result Value Ref Range Status   MRSA by PCR NEGATIVE NEGATIVE Final    Comment:        The GeneXpert MRSA Assay (FDA approved for NASAL specimens only), is one component of a comprehensive MRSA colonization surveillance program. It is not intended to diagnose MRSA infection nor to guide or monitor treatment for MRSA infections.   Urine culture     Status: None   Collection Time: 06/10/15  7:30 PM  Result Value Ref Range Status   Specimen Description URINE, RANDOM  Final   Special Requests NONE  Final   Culture NO GROWTH 2 DAYS  Final   Report Status 06/12/2015 FINAL  Final  Blood Culture (routine x 2)     Status: None   Collection Time: 06/10/15  8:10 PM  Result Value Ref Range Status   Specimen Description BLOOD RIGHT  Final   Special Requests BOTTLES DRAWN AEROBIC AND ANAEROBIC 5CC  Final   Culture  Setup Time   Final    GRAM POSITIVE COCCI AEROBIC BOTTLE ONLY CRITICAL RESULT CALLED TO, READ BACK BY AND VERIFIED WITH: RN Kathi Simpers 06/14/15 1358    Culture   Final    COAGULASE NEGATIVE STAPHYLOCOCCUS Results consistent with contamination.    Report Status 06/16/2015 FINAL  Final  Blood Culture (routine x 2)     Status: None   Collection  Time: 06/10/15  8:20 PM  Result Value Ref Range Status   Specimen Description BLOOD RIGHT  Final   Special Requests BOTTLES DRAWN AEROBIC AND ANAEROBIC 2CC AERO, 4CC  Final   Culture NO GROWTH 5 DAYS  Final   Report Status 06/15/2015 FINAL  Final  MRSA PCR Screening     Status: Abnormal   Collection Time: 06/11/15  1:40 AM  Result Value Ref Range Status   MRSA by PCR POSITIVE (A) NEGATIVE Final    Comment:        The GeneXpert MRSA Assay (FDA approved for NASAL specimens only), is one component of a comprehensive MRSA colonization surveillance program. It is not intended to diagnose MRSA infection nor to guide or monitor treatment for MRSA infections. CRITICAL RESULT CALLED TO, READ BACK BY AND VERIFIED WITH: [facility] ALLEN AT [id number] 06/11/15.PMH   Culture, sputum-assessment     Status: None   Collection Time: 06/11/15  4:30 AM  Result Value Ref Range Status   Specimen Description TRACHEAL ASPIRATE  Final   Special Requests NONE  Final   Sputum evaluation THIS SPECIMEN IS ACCEPTABLE FOR SPUTUM CULTURE  Final   Report Status 06/11/2015 FINAL  Final  Culture, respiratory (NON-Expectorated)     Status: None   Collection Time: 06/11/15  4:30 AM  Result Value Ref Range Status   Specimen Description TRACHEAL ASPIRATE  Final   Special Requests NONE Reflexed from [id number]  Final   Gram Stain   Final    GOOD SPECIMEN - 80-90% WBCS MODERATE WBC SEEN MODERATE GRAM POSITIVE COCCI RARE GRAM NEGATIVE RODS    Culture  HEAVY GROWTH STREPTOCOCCUS PNEUMONIAE  Final   Report Status 06/13/2015 FINAL  Final   Organism ID, Bacteria STREPTOCOCCUS PNEUMONIAE  Final      Susceptibility   Streptococcus pneumoniae - MIC*    ERYTHROMYCIN <=0.12 SENSITIVE Sensitive     VANCOMYCIN 0.5 SENSITIVE Sensitive     TRIMETH/SULFA <=10 SENSITIVE Sensitive     CLINDAMYCIN <=0.25 SENSITIVE Sensitive     LEVOFLOXACIN Value in next row Sensitive      SENSITIVE1    LINEZOLID Value in next row Sensitive       SENSITIVE<=2    * HEAVY GROWTH STREPTOCOCCUS PNEUMONIAE    Assessment: No medications require adjustment for CRRT at present.   Plan:  Pharmacy will continue to monitor patient medications and make adjustments as need for appropriate CRRT dosing.   Chinita Greenland PharmD Clinical Pharmacist 06/18/2015

## 2015-06-18 NOTE — Progress Notes (Signed)
Pt . pt remains intubated on fentanyl drip. Pt has periods of restlessness and was given ativan which was effective. Pt has TF infusing well . Pt has CRRT in progress with 145 UOP/ pt will open eyes spontaneously and follow commands.  Further assessment via flowsheet

## 2015-06-18 NOTE — Progress Notes (Signed)
MEDICATION RELATED CONSULT NOTE - follow up  Pharmacy Consult for Constipation Prevention  Allergies  Allergen Reactions  . Nsaids Nausea And Vomiting and Other (See Comments)    Reaction:  GI distress and burning    Patient Measurements: Height: 5\' 2"  (157.5 cm) Weight: 137 lb 5.6 oz (62.3 kg) IBW/kg (Calculated) : 50.1  Vital Signs: Temp: 97.5 F (36.4 C) (11/19 1400) BP: 133/58 mmHg (11/19 1400) Pulse Rate: 85 (11/19 1400) Intake/Output from previous day: 11/18 0701 - 11/19 0700 In: 4987.2 [I.V.:3377.2; NG/GT:1350; IV Piggyback:260] Out: 1398 [Urine:290; Stool:1] Intake/Output from this shift: Total I/O In: 1585 [I.V.:1000; NG/GT:375; IV Piggyback:210] Out: 321 [Urine:70; Other:251]  Labs:  Recent Labs  06/16/15 0520  06/17/15 0339  06/17/15 2120 06/18/15 0144 06/18/15 0546 06/18/15 0904  WBC 20.9*  --  19.0*  --   --   --  20.4*  --   HGB 7.1*  --  6.8*  --   --   --  6.9*  --   HCT 23.6*  --  23.7*  --   --   --  23.4*  --   PLT 88*  --  88*  --   --   --  83*  --   CREATININE 0.94  < > 0.74  < > 0.76 0.76 0.84 0.86  MG 1.6*  < > 1.6*  < > 1.9 1.8 1.8 1.7  PHOS 1.4*  < >  --   < > 1.5* 1.3*  --  1.6*  ALBUMIN 2.1*  2.1*  < >  --   < > 2.0* 1.8*  --  1.8*  PROT 4.8*  --   --   --   --   --   --   --   AST 78*  --   --   --   --   --   --   --   ALT 407*  --   --   --   --   --   --   --   ALKPHOS 147*  --   --   --   --   --   --   --   BILITOT 0.6  --   --   --   --   --   --   --   BILIDIR 0.1  --   --   --   --   --   --   --   IBILI 0.5  --   --   --   --   --   --   --   < > = values in this interval not displayed. Estimated Creatinine Clearance: 54.4 mL/min (by C-G formula based on Cr of 0.86).   Microbiology: Recent Results (from the past 720 hour(s))  Culture, blood (routine x 2)     Status: None   Collection Time: 05/21/15 10:49 PM  Result Value Ref Range Status   Specimen Description BLOOD RIGHT HAND  Final   Special Requests BOTTLES DRAWN  AEROBIC AND ANAEROBIC 4CC  Final   Culture NO GROWTH 5 DAYS  Final   Report Status 05/26/2015 FINAL  Final  Culture, blood (routine x 2)     Status: None   Collection Time: 05/21/15 11:00 PM  Result Value Ref Range Status   Specimen Description BLOOD LEFT ASSIST CONTROL  Final   Special Requests BOTTLES DRAWN AEROBIC AND ANAEROBIC 4CC  Final   Culture NO GROWTH 5 DAYS  Final  Report Status 05/26/2015 FINAL  Final  MRSA PCR Screening     Status: None   Collection Time: 05/22/15  1:46 AM  Result Value Ref Range Status   MRSA by PCR NEGATIVE NEGATIVE Final    Comment:        The GeneXpert MRSA Assay (FDA approved for NASAL specimens only), is one component of a comprehensive MRSA colonization surveillance program. It is not intended to diagnose MRSA infection nor to guide or monitor treatment for MRSA infections.   Urine culture     Status: None   Collection Time: 06/10/15  7:30 PM  Result Value Ref Range Status   Specimen Description URINE, RANDOM  Final   Special Requests NONE  Final   Culture NO GROWTH 2 DAYS  Final   Report Status 06/12/2015 FINAL  Final  Blood Culture (routine x 2)     Status: None   Collection Time: 06/10/15  8:10 PM  Result Value Ref Range Status   Specimen Description BLOOD RIGHT  Final   Special Requests BOTTLES DRAWN AEROBIC AND ANAEROBIC 5CC  Final   Culture  Setup Time   Final    GRAM POSITIVE COCCI AEROBIC BOTTLE ONLY CRITICAL RESULT CALLED TO, READ BACK BY AND VERIFIED WITH: RN Kathi Simpers 06/14/15 1358    Culture   Final    COAGULASE NEGATIVE STAPHYLOCOCCUS Results consistent with contamination.    Report Status 06/16/2015 FINAL  Final  Blood Culture (routine x 2)     Status: None   Collection Time: 06/10/15  8:20 PM  Result Value Ref Range Status   Specimen Description BLOOD RIGHT  Final   Special Requests BOTTLES DRAWN AEROBIC AND ANAEROBIC 2CC AERO, 4CC  Final   Culture NO GROWTH 5 DAYS  Final   Report Status 06/15/2015  FINAL  Final  MRSA PCR Screening     Status: Abnormal   Collection Time: 06/11/15  1:40 AM  Result Value Ref Range Status   MRSA by PCR POSITIVE (A) NEGATIVE Final    Comment:        The GeneXpert MRSA Assay (FDA approved for NASAL specimens only), is one component of a comprehensive MRSA colonization surveillance program. It is not intended to diagnose MRSA infection nor to guide or monitor treatment for MRSA infections. CRITICAL RESULT CALLED TO, READ BACK BY AND VERIFIED WITH: [facility] ALLEN AT [id number] 06/11/15.PMH   Culture, sputum-assessment     Status: None   Collection Time: 06/11/15  4:30 AM  Result Value Ref Range Status   Specimen Description TRACHEAL ASPIRATE  Final   Special Requests NONE  Final   Sputum evaluation THIS SPECIMEN IS ACCEPTABLE FOR SPUTUM CULTURE  Final   Report Status 06/11/2015 FINAL  Final  Culture, respiratory (NON-Expectorated)     Status: None   Collection Time: 06/11/15  4:30 AM  Result Value Ref Range Status   Specimen Description TRACHEAL ASPIRATE  Final   Special Requests NONE Reflexed from [id number]  Final   Gram Stain   Final    GOOD SPECIMEN - 80-90% WBCS MODERATE WBC SEEN MODERATE GRAM POSITIVE COCCI RARE GRAM NEGATIVE RODS    Culture HEAVY GROWTH STREPTOCOCCUS PNEUMONIAE  Final   Report Status 06/13/2015 FINAL  Final   Organism ID, Bacteria STREPTOCOCCUS PNEUMONIAE  Final      Susceptibility   Streptococcus pneumoniae - MIC*    ERYTHROMYCIN <=0.12 SENSITIVE Sensitive     VANCOMYCIN 0.5 SENSITIVE Sensitive     TRIMETH/SULFA <=10 SENSITIVE Sensitive  CLINDAMYCIN <=0.25 SENSITIVE Sensitive     LEVOFLOXACIN Value in next row Sensitive      SENSITIVE1    LINEZOLID Value in next row Sensitive      SENSITIVE<=2    * HEAVY GROWTH STREPTOCOCCUS PNEUMONIAE    Medical History: Past Medical History  Diagnosis Date  . COPD (chronic obstructive pulmonary disease) ([place])   . Hypertension   . Ischemic cardiomyopathy     a.   . Coronary  artery disease     a. s/p multiple stenting in 2005  . History of ventricular tachycardia   . PVD (peripheral vascular disease) ([place])   . PAD (peripheral artery disease) ([place])   . HLD (hyperlipidemia)   . Chronic back pain   . MI, old   . MRSA (methicillin resistant staph aureus) culture positive     left foot wound  . CHF (congestive heart failure) ([place])   . Pneumonia   . Asthma   . Cancer [facility])     Assessment: 69 yo female requiring constipation prophylaxis. Pharmacy consulted for management and monitoring.   Plan:  Last BM charted 11/17. Will continue Miralax PRN with docusate/Senna twice daily scheduled. Pharmacy will continue to follow and make adjustments as needed.   11/19: + stool unmeasured x1 on 11/18.  Chinita Greenland PharmD Clinical Pharmacist 06/18/2015

## 2015-06-18 NOTE — Progress Notes (Signed)
PULMONARY / CRITICAL CARE MEDICINE   Name: Lynn Butler MRN: [id number] DOB: [birth year]    ADMISSION DATE:  06/10/2015 CONSULTATION DATE:  11/12   CC: follow up resp failure HPI:  Patient remains intubated, failed SAT/SBT due to resp muscle fatigue  remains critically ill, prognosis is poor, on CRRT  MAJOR EVENTS/TEST RESULTS: 11/11 CTAP: NAD. Severe atherosclerosis 11/11 CT head: NAD 11/12 CT head: NAD 11/13 Worsening renal function. Renal consultation.  Deemed poor candidate for HD/CRRT of any duration. Goals of care discussed with pt's daughter 11/14-may start CRRT, vasc cath may be needed 11/16 failed SAT/SBT  INDWELLING DEVICES:: ETT 11/11 >>  L IJ CVL 11/11 >> RT femoral vasc cath>>11/14  MICRO DATA: Urine 11/11 >> NEG Blood 11/11 >>  MRSA PCR 11/12 >> POS Resp 11/12 >>     ANTIMICROBIALS: Vanc 11/11 >>  Pip-tazo 11/11 >>    SUBJECTIVE:  Pt awake this am. Follows basic commands.   VITAL SIGNS: Temp:  [95.5 F (35.3 C)-98.8 F (37.1 C)] 98.2 F (36.8 C) (11/19 0800) Pulse Rate:  [63-101] 86 (11/19 0800) Resp:  [20-28] 25 (11/19 0800) BP: (75-144)/(38-91) 137/62 mmHg (11/19 0800) SpO2:  [76 %-100 %] 100 % (11/19 0823) FiO2 (%):  [28 %] 28 % (11/19 0823) Weight:  [137 lb 5.6 oz (62.3 kg)] 137 lb 5.6 oz (62.3 kg) (11/19 0400) HEMODYNAMICS:   VENTILATOR SETTINGS: Vent Mode:  [-] PRVC FiO2 (%):  [28 %] 28 % Set Rate:  [24 bmp] 24 bmp Vt Set:  [400 mL] 400 mL PEEP:  [5 cmH20] 5 cmH20 Plateau Pressure:  [15 cmH20-17 cmH20] 15 cmH20 INTAKE / OUTPUT:  Intake/Output Summary (Last 24 hours) at 06/18/15 0827 Last data filed at 06/18/15 0700  Gross per 24 hour  Intake 4707.41 ml  Output   1349 ml  Net 3358.41 ml    PHYSICAL EXAMINATION: General: RASS -3, frail appearing, appears much older than chronological age Neuro: PERRL, minimal withdrawal from pain, DTRs symmetric HEENT: NCAT, forehead lesion Cardiovascular: reg, no M Lungs: no  wheezes Abdomen: scaphoid, diminished BS Ext: severe atrophy, no edema GCS 8T  LABS:  CBC  Recent Labs Lab 06/16/15 0520 06/17/15 0339 06/18/15 0546  WBC 20.9* 19.0* 20.4*  HGB 7.1* 6.8* 6.9*  HCT 23.6* 23.7* 23.4*  PLT 88* 88* 83*   Coag's  Recent Labs Lab 06/11/15 1351 06/12/15 1101 06/13/15 2057 06/14/15 0543  APTT 30 29  --   --   INR 1.93  --  1.45 1.28   BMET  Recent Labs Lab 06/17/15 2120 06/18/15 0144 06/18/15 0546  NA 135 136 136  K 4.7 4.4 4.5  CL 104 105 104  CO2 27 29 29   BUN 13 13 13   CREATININE 0.76 0.76 0.84  GLUCOSE 184* 142* 106*   Electrolytes  Recent Labs Lab 06/17/15 1807 06/17/15 2120 06/18/15 0144 06/18/15 0546  CALCIUM 7.6* 7.8* 7.8* 8.0*  MG 2.2 1.9 1.8 1.8  PHOS 1.5* 1.5* 1.3*  --    Sepsis Markers  Recent Labs Lab 06/12/15 0330  PROCALCITON 32.06   ABG  Recent Labs Lab 06/13/15 1140 06/14/15 0825 06/15/15 1125  PHART 7.29* 7.33* 7.36  PCO2ART 49* 58* 53*  PO2ART 64* 48* 39*   Liver Enzymes  Recent Labs Lab 06/12/15 0330  06/14/15 0543  06/16/15 0520  06/17/15 1807 06/17/15 2120 06/18/15 0144  AST 2031*  --  280*  --  78*  --   --   --   --  ALT 1958*  --  819*  --  407*  --   --   --   --   ALKPHOS 350*  --  187*  --  147*  --   --   --   --   BILITOT 0.8  --  0.5  --  0.6  --   --   --   --   ALBUMIN 2.4*  < > 1.9*  < > 2.1*  2.1*  < > 1.7* 2.0* 1.8*  < > = values in this interval not displayed. Cardiac Enzymes  Recent Labs Lab 06/11/15 1351 06/11/15 2252 06/12/15 0540  TROPONINI 17.69* 15.54* 12.20*   Glucose  Recent Labs Lab 06/17/15 1152 06/17/15 1607 06/17/15 1932 06/18/15 0022 06/18/15 0426 06/18/15 0652  GLUCAP 98 156* 114* 154* 102* 59    CXR: CM, no edema or infiltrates   ASSESSMENT / PLAN:  69 yo female with acute resp failure and acute multiorgan failure(resp,cardiovascualr,renal,liver) from acute strep  pneumonia and cardiogenic/septic shock With  COPD  PULMONARY A: Acute resp failure- Chronic COPD without acute bronchospasm P:   Cont full vent support - settings reviewed and/or adjusted-plan for SAT/SBT today Cont vent bundle -follow up ABG and CXR as needed  CARDIOVASCULAR A:  Acute MI-cardiogenic shock -vasopressors as needed CAD - 2VD deemed to not be amenable to revascularization PVD P:  MAP goal > 65 mmHg Cards consultation: no role for cardiac intervention due to MODS, poor baseline -follow up cardiology recs  RENAL A:  AKI Mild metabolic acidosis Mild hyperkalemia P:   Monitor BMET intermittently Monitor I/Os Correct electrolytes as indicated HCO3 gtt initiated 11/13 Renal consultation 11/13-s/p HD vasc cath-started on CRRT-  GASTROINTESTINAL A:   No issues P:   SUP: IV PPI Continue TFs   HEMATOLOGIC A:   Mild anemia without overt bleeding P:  DVT px: SQ heparin Monitor CBC intermittently Transfuse per usual guidelines  INFECTIOUS Strep pneumonia P:   Monitor temp, WBC count   ENDOCRINE A:   Hyperglycemia without documented DM H/O frequent steroid dosing - suspect adrenal insuff P:   Cont SSI Cont stress dose steroids  NEUROLOGIC A:   Severe acute encephalopathy Severe baseline debilitation P:   RASS goal: -1, -2 Cont to address goals of care.    I have personally obtained a history, examined the patient, evaluated Pertinent laboratory and [employer]/imaging results, and  formulated the assessment and plan The Patient requires high complexity decision making for assessment and support, frequent evaluation and titration of therapies, application of advanced monitoring technologies and extensive interpretation of multiple databases. Critical Care Time devoted to patient care services described in this note is 40 minutes.   Overall, patient is critically ill, prognosis is guarded.  Patient with Multiorgan failure and at high risk for cardiac arrest and death. Will obtain  palliative care consult   Marda Stalker, M.D.       06/18/2015, 8:27 AM

## 2015-06-18 NOTE — Progress Notes (Signed)
ELECTROLYTE  CONSULT NOTE - INITIAL  Pharmacy Consult for Electrolyte monitoring and management    Allergies  Allergen Reactions  . Nsaids Nausea And Vomiting and Other (See Comments)    Reaction:  GI distress and burning     Patient Measurements: Height: 5\' 2"  (157.5 cm) Weight: 137 lb 5.6 oz (62.3 kg) IBW/kg (Calculated) : 50.1  Vital Signs: Temp: 97.5 F (36.4 C) (11/19 1400) BP: 133/58 mmHg (11/19 1400) Pulse Rate: 85 (11/19 1400) Intake/Output from previous day: 11/18 0701 - 11/19 0700 In: 4987.2 [I.V.:3377.2; NG/GT:1350; IV Piggyback:260] Out: [id number] [Urine:290; Stool:1] Intake/Output from this shift: Total I/O In: 1400 [I.V.:860; NG/GT:330; IV Piggyback:210] Out: 321 [Urine:50; Emesis/NG output:20; Other:251]  Labs:  Recent Labs  06/16/15 0520 06/17/15 0339 06/18/15 0546  WBC 20.9* 19.0* 20.4*  HGB 7.1* 6.8* 6.9*  HCT 23.6* 23.7* 23.4*  PLT 88* 88* 83*     Recent Labs  06/16/15 0520  06/17/15 2120 06/18/15 0144 06/18/15 0546 06/18/15 0904  NA 140  < > 135 136 136 136  K 4.4  < > 4.7 4.4 4.5 4.3  CL 106  < > 104 105 104 105  CO2 29  < > 27 29 29 29   GLUCOSE 130*  < > 184* 142* 106* 122*  BUN 18  < > 13 13 13 14   CREATININE 0.94  < > 0.76 0.76 0.84 0.86  CALCIUM 8.4*  < > 7.8* 7.8* 8.0* 7.5*  MG 1.6*  < > 1.9 1.8 1.8 1.7  PHOS 1.4*  < > 1.5* 1.3*  --  1.6*  PROT 4.8*  --   --   --   --   --   ALBUMIN 2.1*  2.1*  < > 2.0* 1.8*  --  1.8*  AST 78*  --   --   --   --   --   ALT 407*  --   --   --   --   --   ALKPHOS 147*  --   --   --   --   --   BILITOT 0.6  --   --   --   --   --   BILIDIR 0.1  --   --   --   --   --   IBILI 0.5  --   --   --   --   --   < > = values in this interval not displayed. Estimated Creatinine Clearance: 54.4 mL/min (by C-G formula based on Cr of 0.86).    Recent Labs  06/18/15 0426 06/18/15 0652 06/18/15 1115  GLUCAP 102* 87 148*    Medical History: Past Medical History  Diagnosis Date  . COPD (chronic  obstructive pulmonary disease) ([place])   . Hypertension   . Ischemic cardiomyopathy     a.   . Coronary artery disease     a. s/p multiple stenting in 2005  . History of ventricular tachycardia   . PVD (peripheral vascular disease) ([place])   . PAD (peripheral artery disease) ([place])   . HLD (hyperlipidemia)   . Chronic back pain   . MI, old   . MRSA (methicillin resistant staph aureus) culture positive     left foot wound  . CHF (congestive heart failure) ([place])   . Pneumonia   . Asthma   . Cancer [facility])     Assessment: 69 yo patient who remains intubated in ICU. Patient is being restarted on CRRT. Pharmacy consulted for electrolyte monitoring  and management.   Phos: 1.6 Mg: 1.7 K: 4.3   Plan:  Will order 56mmol Sodium phosphate and 1Gm magnesium. Renal functional panel ordered every 4 hours. Pharmacy will continue to monitor and replace electrolytes daily.  Nancy Fetter, PharmD Pharmacy Resident

## 2015-06-19 ENCOUNTER — Inpatient Hospital Stay: Payer: Medicare Other

## 2015-06-19 DIAGNOSIS — I10 Essential (primary) hypertension: Secondary | ICD-10-CM

## 2015-06-19 LAB — GLUCOSE, CAPILLARY
GLUCOSE-CAPILLARY: 140 mg/dL — AB (ref 65–99)
GLUCOSE-CAPILLARY: 60 mg/dL — AB (ref 65–99)
GLUCOSE-CAPILLARY: 132 mg/dL — AB (ref 65–99)
GLUCOSE-CAPILLARY: 81 mg/dL (ref 65–99)
Glucose-Capillary: 115 mg/dL — ABNORMAL HIGH (ref 65–99)
Glucose-Capillary: 57 mg/dL — ABNORMAL LOW (ref 65–99)
Glucose-Capillary: 115 mg/dL — ABNORMAL HIGH (ref 65–99)
Glucose-Capillary: 37 mg/dL — CL (ref 65–99)
Glucose-Capillary: 111 mg/dL — ABNORMAL HIGH (ref 65–99)

## 2015-06-19 LAB — RENAL FUNCTION PANEL
ALBUMIN: 2.1 g/dL — AB (ref 3.5–5.0)
ANION GAP: 4 — AB (ref 5–15)
ANION GAP: 4 — AB (ref 5–15)
Albumin: 2.2 g/dL — ABNORMAL LOW (ref 3.5–5.0)
Albumin: 2.1 g/dL — ABNORMAL LOW (ref 3.5–5.0)
Albumin: 2 g/dL — ABNORMAL LOW (ref 3.5–5.0)
Albumin: 2.2 g/dL — ABNORMAL LOW (ref 3.5–5.0)
Albumin: 2.1 g/dL — ABNORMAL LOW (ref 3.5–5.0)
Anion gap: 3 — ABNORMAL LOW (ref 5–15)
Anion gap: 4 — ABNORMAL LOW (ref 5–15)
Anion gap: 3 — ABNORMAL LOW (ref 5–15)
Anion gap: 2 — ABNORMAL LOW (ref 5–15)
BUN: 11 mg/dL (ref 6–20)
BUN: 10 mg/dL (ref 6–20)
BUN: 10 mg/dL (ref 6–20)
BUN: 10 mg/dL (ref 6–20)
BUN: 10 mg/dL (ref 6–20)
BUN: 9 mg/dL (ref 6–20)
CALCIUM: 7.9 mg/dL — AB (ref 8.9–10.3)
CALCIUM: 7.9 mg/dL — AB (ref 8.9–10.3)
CALCIUM: 7.8 mg/dL — AB (ref 8.9–10.3)
CHLORIDE: 104 mmol/L (ref 101–111)
CHLORIDE: 103 mmol/L (ref 101–111)
CHLORIDE: 105 mmol/L (ref 101–111)
CHLORIDE: 104 mmol/L (ref 101–111)
CO2: 29 mmol/L (ref 22–32)
CO2: 28 mmol/L (ref 22–32)
CO2: 27 mmol/L (ref 22–32)
CO2: 28 mmol/L (ref 22–32)
CO2: 27 mmol/L (ref 22–32)
CO2: 29 mmol/L (ref 22–32)
CREATININE: 0.67 mg/dL (ref 0.44–1.00)
CREATININE: 0.65 mg/dL (ref 0.44–1.00)
CREATININE: 0.69 mg/dL (ref 0.44–1.00)
CREATININE: 0.61 mg/dL (ref 0.44–1.00)
Calcium: 8 mg/dL — ABNORMAL LOW (ref 8.9–10.3)
Calcium: 7.8 mg/dL — ABNORMAL LOW (ref 8.9–10.3)
Calcium: 8 mg/dL — ABNORMAL LOW (ref 8.9–10.3)
Chloride: 103 mmol/L (ref 101–111)
Chloride: 103 mmol/L (ref 101–111)
Creatinine, Ser: 0.71 mg/dL (ref 0.44–1.00)
Creatinine, Ser: 0.68 mg/dL (ref 0.44–1.00)
GFR calc Af Amer: >60 mL/min (ref >60)
GFR calc Af Amer: >60 mL/min (ref >60)
GFR calc non Af Amer: >60 mL/min (ref >60)
GFR calc non Af Amer: >60 mL/min (ref >60)
GFR calc non Af Amer: >60 mL/min (ref >60)
GLUCOSE: 157 mg/dL — AB (ref 65–99)
GLUCOSE: 121 mg/dL — AB (ref 65–99)
Glucose, Bld: 119 mg/dL — ABNORMAL HIGH (ref 65–99)
Glucose, Bld: 115 mg/dL — ABNORMAL HIGH (ref 65–99)
Glucose, Bld: 143 mg/dL — ABNORMAL HIGH (ref 65–99)
Glucose, Bld: 168 mg/dL — ABNORMAL HIGH (ref 65–99)
PHOSPHORUS: 1.7 mg/dL — AB (ref 2.5–4.6)
PHOSPHORUS: 1.9 mg/dL — AB (ref 2.5–4.6)
POTASSIUM: 4 mmol/L (ref 3.5–5.1)
POTASSIUM: 4.3 mmol/L (ref 3.5–5.1)
POTASSIUM: 4.4 mmol/L (ref 3.5–5.1)
Phosphorus: 2.4 mg/dL — ABNORMAL LOW (ref 2.5–4.6)
Phosphorus: 1.9 mg/dL — ABNORMAL LOW (ref 2.5–4.6)
Phosphorus: 1.9 mg/dL — ABNORMAL LOW (ref 2.5–4.6)
Phosphorus: 2 mg/dL — ABNORMAL LOW (ref 2.5–4.6)
Potassium: 4.4 mmol/L (ref 3.5–5.1)
Potassium: 4.1 mmol/L (ref 3.5–5.1)
Potassium: 4.2 mmol/L (ref 3.5–5.1)
SODIUM: 135 mmol/L (ref 135–145)
SODIUM: 134 mmol/L — AB (ref 135–145)
Sodium: 136 mmol/L (ref 135–145)
Sodium: 135 mmol/L (ref 135–145)
Sodium: 136 mmol/L (ref 135–145)
Sodium: 134 mmol/L — ABNORMAL LOW (ref 135–145)

## 2015-06-19 LAB — TYPE AND SCREEN
ABO/RH(D): O POS
ANTIBODY SCREEN: NEGATIVE
UNIT DIVISION: 0
UNIT DIVISION: 0

## 2015-06-19 LAB — CBC
HEMATOCRIT: 27.2 % — AB (ref 35.0–47.0)
Hemoglobin: 8.2 g/dL — ABNORMAL LOW (ref 12.0–16.0)
MCH: 22.4 pg — ABNORMAL LOW (ref 26.0–34.0)
MCHC: 30 g/dL — ABNORMAL LOW (ref 32.0–36.0)
MCV: 74.7 fL — AB (ref 80.0–100.0)
Platelets: 119 10*3/uL — ABNORMAL LOW (ref 150–440)
RBC: 3.65 MIL/uL — ABNORMAL LOW (ref 3.80–5.20)
RDW: 26 % — AB (ref 11.5–14.5)
WBC: 20.2 10*3/uL — AB (ref 3.6–11.0)

## 2015-06-19 LAB — MAGNESIUM
MAGNESIUM: 1.6 mg/dL — AB (ref 1.7–2.4)
MAGNESIUM: 1.6 mg/dL — AB (ref 1.7–2.4)
MAGNESIUM: 1.5 mg/dL — AB (ref 1.7–2.4)
MAGNESIUM: 1.5 mg/dL — AB (ref 1.7–2.4)
Magnesium: 1.7 mg/dL (ref 1.7–2.4)
Magnesium: 2.1 mg/dL (ref 1.7–2.4)

## 2015-06-19 MED ORDER — ONDANSETRON HCL 4 MG/2ML IJ SOLN
INTRAMUSCULAR | Status: AC
  Filled 2015-06-19: qty 2

## 2015-06-19 MED ORDER — HYDROMORPHONE HCL 1 MG/ML IJ SOLN
2.0000 mg | INTRAMUSCULAR | Status: DC | PRN
  Administered 2015-06-19: 2 mg via INTRAVENOUS
  Filled 2015-06-19: qty 2

## 2015-06-19 MED ORDER — FERROUS SULFATE 220 (44 FE) MG/5ML PO ELIX
220.0000 mg | ORAL_SOLUTION | Freq: Two times a day (BID) | ORAL | Status: DC
  Administered 2015-06-19 – 2015-06-22 (×6): 220 mg via ORAL
  Filled 2015-06-19 (×8): qty 5

## 2015-06-19 MED ORDER — ZOLPIDEM TARTRATE 5 MG PO TABS
5.0000 mg | ORAL_TABLET | Freq: Every evening | ORAL | Status: DC | PRN
  Administered 2015-06-19 – 2015-07-04 (×13): 5 mg via ORAL
  Filled 2015-06-19 (×13): qty 1

## 2015-06-19 MED ORDER — MAGNESIUM SULFATE 2 GM/50ML IV SOLN
2.0000 g | Freq: Once | INTRAVENOUS | Status: AC
  Administered 2015-06-19: 2 g via INTRAVENOUS
  Filled 2015-06-19: qty 50

## 2015-06-19 MED ORDER — HYDROMORPHONE HCL 1 MG/ML IJ SOLN
2.0000 mg | INTRAMUSCULAR | Status: DC | PRN
  Administered 2015-06-19 – 2015-06-22 (×20): 2 mg via INTRAVENOUS
  Filled 2015-06-19 (×20): qty 2

## 2015-06-19 MED ORDER — SENNOSIDES-DOCUSATE SODIUM 8.6-50 MG PO TABS: 1.0000 | ORAL_TABLET | Freq: Every day | ORAL | Status: DC | PRN

## 2015-06-19 MED ORDER — POTASSIUM & SODIUM PHOSPHATES 280-160-250 MG PO PACK
1.0000 | PACK | Freq: Three times a day (TID) | ORAL | Status: AC
  Administered 2015-06-19: 1
  Filled 2015-06-19: qty 1

## 2015-06-19 MED ORDER — ONDANSETRON HCL 4 MG/2ML IJ SOLN
4.0000 mg | Freq: Four times a day (QID) | INTRAMUSCULAR | Status: DC | PRN
  Administered 2015-06-19 – 2015-06-24 (×6): 4 mg via INTRAVENOUS
  Filled 2015-06-19 (×5): qty 2

## 2015-06-19 MED ORDER — LIDOCAINE HCL 1 % IJ SOLN
10.0000 mL | Freq: Once | INTRAMUSCULAR | Status: AC
  Administered 2015-06-19: 10 mL via INTRADERMAL
  Filled 2015-06-19: qty 10

## 2015-06-19 MED ORDER — K PHOS MONO-SOD PHOS DI & MONO 155-852-130 MG PO TABS: 250.0000 mg | ORAL_TABLET | Freq: Three times a day (TID) | ORAL | Status: DC

## 2015-06-19 MED ORDER — POTASSIUM PHOSPHATES 15 MMOLE/5ML IV SOLN
10.0000 mmol | Freq: Once | INTRAVENOUS | Status: AC
  Administered 2015-06-19: 10 mmol via INTRAVENOUS
  Filled 2015-06-19: qty 3.33

## 2015-06-19 NOTE — Progress Notes (Signed)
PHARMACY - Consult for CRRT dosing and monitoring  Pharmacy Consult for CRRT dosing and monitoring     Allergies  Allergen Reactions  . Nsaids Nausea And Vomiting and Other (See Comments)    Reaction:  GI distress and burning     Patient Measurements: Height: 5\' 2"  (157.5 cm) Weight: 144 lb 6.4 oz (65.5 kg) IBW/kg (Calculated) : 50.1  Vital Signs: Temp: 97.2 F (36.2 C) (11/20 1500) Temp Source: Other (Comment) (11/20 0400) BP: 124/64 mmHg (11/20 1500) Pulse Rate: 77 (11/20 1500) Intake/Output from previous day: 11/19 0701 - 11/20 0700 In: 4860.5 [I.V.:2980.5; NG/GT:1260; IV Piggyback:620] Out: 2095 [Urine:210] Total I/O In: [id number] [I.V.:1280; NG/GT:360; IV Piggyback:50] Out: 806 [Other:806] Vent settings for last 24 hours: Vent Mode:  [-] PRVC FiO2 (%):  [28 %] 28 % Set Rate:  [20 bmp] 20 bmp Vt Set:  [400 mL] 400 mL PEEP:  [5 cmH20] 5 cmH20 Pressure Support:  [5 cmH20] 5 cmH20 Plateau Pressure:  [19 cmH20] 19 cmH20  Labs:  Recent Labs  06/17/15 0339  06/18/15 0546  06/18/15 1335  06/19/15 0509 06/19/15 0529 06/19/15 0936 06/19/15 1343  WBC 19.0*  --  20.4*  --   --   --   --  20.2*  --   --   HGB 6.8*  --  6.9*  --  8.7*  --   --  8.2*  --   --   HCT 23.7*  --  23.4*  --  28.5*  --   --  27.2*  --   --   PLT 88*  --  83*  --   --   --   --  119*  --   --   CREATININE 0.74  < > 0.84  < > 0.89  < >  --  0.71 0.65 0.68  MG 1.6*  < > 1.8  < > 1.7  < > 1.6*  --  1.6* 1.5*  PHOS  --   < >  --   < > 1.8*  < >  --  1.9* 1.9* 1.7*  ALBUMIN  --   < >  --   < > 2.1*  < >  --  2.1* 2.0* 2.1*  < > = values in this interval not displayed. Estimated Creatinine Clearance: 59.8 mL/min (by C-G formula based on Cr of 0.68).   Recent Labs  06/19/15 0738 06/19/15 1205 06/19/15 1217  GLUCAP 111* 60* 140*    Microbiology: Recent Results (from the past 720 hour(s))  Culture, blood (routine x 2)     Status: None   Collection Time: 05/21/15 10:49 PM  Result Value Ref  Range Status   Specimen Description BLOOD RIGHT HAND  Final   Special Requests BOTTLES DRAWN AEROBIC AND ANAEROBIC 4CC  Final   Culture NO GROWTH 5 DAYS  Final   Report Status 05/26/2015 FINAL  Final  Culture, blood (routine x 2)     Status: None   Collection Time: 05/21/15 11:00 PM  Result Value Ref Range Status   Specimen Description BLOOD LEFT ASSIST CONTROL  Final   Special Requests BOTTLES DRAWN AEROBIC AND ANAEROBIC 4CC  Final   Culture NO GROWTH 5 DAYS  Final   Report Status 05/26/2015 FINAL  Final  MRSA PCR Screening     Status: None   Collection Time: 05/22/15  1:46 AM  Result Value Ref Range Status   MRSA by PCR NEGATIVE NEGATIVE Final    Comment:  The GeneXpert MRSA Assay (FDA approved for NASAL specimens only), is one component of a comprehensive MRSA colonization surveillance program. It is not intended to diagnose MRSA infection nor to guide or monitor treatment for MRSA infections.   Urine culture     Status: None   Collection Time: 06/10/15  7:30 PM  Result Value Ref Range Status   Specimen Description URINE, RANDOM  Final   Special Requests NONE  Final   Culture NO GROWTH 2 DAYS  Final   Report Status 06/12/2015 FINAL  Final  Blood Culture (routine x 2)     Status: None   Collection Time: 06/10/15  8:10 PM  Result Value Ref Range Status   Specimen Description BLOOD RIGHT  Final   Special Requests BOTTLES DRAWN AEROBIC AND ANAEROBIC 5CC  Final   Culture  Setup Time   Final    GRAM POSITIVE COCCI AEROBIC BOTTLE ONLY CRITICAL RESULT CALLED TO, READ BACK BY AND VERIFIED WITH: RN Kathi Simpers 06/14/15 1358    Culture   Final    COAGULASE NEGATIVE STAPHYLOCOCCUS Results consistent with contamination.    Report Status 06/16/2015 FINAL  Final  Blood Culture (routine x 2)     Status: None   Collection Time: 06/10/15  8:20 PM  Result Value Ref Range Status   Specimen Description BLOOD RIGHT  Final   Special Requests BOTTLES DRAWN AEROBIC AND  ANAEROBIC 2CC AERO, 4CC  Final   Culture NO GROWTH 5 DAYS  Final   Report Status 06/15/2015 FINAL  Final  MRSA PCR Screening     Status: Abnormal   Collection Time: 06/11/15  1:40 AM  Result Value Ref Range Status   MRSA by PCR POSITIVE (A) NEGATIVE Final    Comment:        The GeneXpert MRSA Assay (FDA approved for NASAL specimens only), is one component of a comprehensive MRSA colonization surveillance program. It is not intended to diagnose MRSA infection nor to guide or monitor treatment for MRSA infections. CRITICAL RESULT CALLED TO, READ BACK BY AND VERIFIED WITH: [facility] ALLEN AT [id number] 06/11/15.PMH   Culture, sputum-assessment     Status: None   Collection Time: 06/11/15  4:30 AM  Result Value Ref Range Status   Specimen Description TRACHEAL ASPIRATE  Final   Special Requests NONE  Final   Sputum evaluation THIS SPECIMEN IS ACCEPTABLE FOR SPUTUM CULTURE  Final   Report Status 06/11/2015 FINAL  Final  Culture, respiratory (NON-Expectorated)     Status: None   Collection Time: 06/11/15  4:30 AM  Result Value Ref Range Status   Specimen Description TRACHEAL ASPIRATE  Final   Special Requests NONE Reflexed from [id number]  Final   Gram Stain   Final    GOOD SPECIMEN - 80-90% WBCS MODERATE WBC SEEN MODERATE GRAM POSITIVE COCCI RARE GRAM NEGATIVE RODS    Culture HEAVY GROWTH STREPTOCOCCUS PNEUMONIAE  Final   Report Status 06/13/2015 FINAL  Final   Organism ID, Bacteria STREPTOCOCCUS PNEUMONIAE  Final      Susceptibility   Streptococcus pneumoniae - MIC*    ERYTHROMYCIN <=0.12 SENSITIVE Sensitive     VANCOMYCIN 0.5 SENSITIVE Sensitive     TRIMETH/SULFA <=10 SENSITIVE Sensitive     CLINDAMYCIN <=0.25 SENSITIVE Sensitive     LEVOFLOXACIN Value in next row Sensitive      SENSITIVE1    LINEZOLID Value in next row Sensitive      SENSITIVE<=2    * HEAVY GROWTH STREPTOCOCCUS PNEUMONIAE  Assessment: No medications require adjustment for CRRT at present.   Plan:   Pharmacy will continue to monitor patient medications and make adjustments as need for appropriate CRRT dosing.   Chinita Greenland PharmD Clinical Pharmacist 06/19/2015 3:48 PM

## 2015-06-19 NOTE — Progress Notes (Signed)
Passed bedside swallow, spoke with dr. Verdell Carmine and ordered clear liquid diet

## 2015-06-19 NOTE — Progress Notes (Signed)
   06/19/15 1301  Clinical Encounter Type  Visited With Patient;Patient and family together;Health care provider  Visit Type Follow-up;Spiritual support  Referral From Nurse  Consult/Referral To Chaplain  Spiritual Encounters  Spiritual Needs Emotional  Stress Factors  Patient Stress Factors Health changes  Family Stress Factors Health changes  Chaplain was asked to support patient and family as patient was going to be extubated. Provided a compassionate presence and support as needed. Patient seemed appreciative for the extubation and support. Chaplain Aaliyha Mumford A. Agastya Meister Ext. [phone]

## 2015-06-19 NOTE — Progress Notes (Signed)
Placed on high fowlers position, cuff deflatedd suctioned orally and endotracheally and then extubated to 2 lpm o2 [place]

## 2015-06-19 NOTE — Progress Notes (Signed)
45 minutes on spontaneous this morning/ per dr Juanell Fairly place patient back on full support... Waiting on clarification from family

## 2015-06-19 NOTE — Progress Notes (Addendum)
MEDICATION RELATED CONSULT NOTE - follow up  Pharmacy Consult for Constipation Prevention  Allergies  Allergen Reactions  . Nsaids Nausea And Vomiting and Other (See Comments)    Reaction:  GI distress and burning    Patient Measurements: Height: 5\' 2"  (157.5 cm) Weight: 144 lb 6.4 oz (65.5 kg) IBW/kg (Calculated) : 50.1  Vital Signs: Temp: 97.2 F (36.2 C) (11/20 1500) Temp Source: Other (Comment) (11/20 0400) BP: 124/64 mmHg (11/20 1500) Pulse Rate: 77 (11/20 1500) Intake/Output from previous day: 11/19 0701 - 11/20 0700 In: 4860.5 [I.V.:2980.5; NG/GT:1260; IV Piggyback:620] Out: 2095 [Urine:210] Intake/Output from this shift: Total I/O In: 1690 [I.V.:1280; NG/GT:360; IV Piggyback:50] Out: 806 [Other:806]  Labs:  Recent Labs  06/17/15 0339  06/18/15 0546  06/18/15 1335  06/19/15 0509 06/19/15 0529 06/19/15 0936 06/19/15 1343  WBC 19.0*  --  20.4*  --   --   --   --  20.2*  --   --   HGB 6.8*  --  6.9*  --  8.7*  --   --  8.2*  --   --   HCT 23.7*  --  23.4*  --  28.5*  --   --  27.2*  --   --   PLT 88*  --  83*  --   --   --   --  119*  --   --   CREATININE 0.74  < > 0.84  < > 0.89  < >  --  0.71 0.65 0.68  MG 1.6*  < > 1.8  < > 1.7  < > 1.6*  --  1.6* 1.5*  PHOS  --   < >  --   < > 1.8*  < >  --  1.9* 1.9* 1.7*  ALBUMIN  --   < >  --   < > 2.1*  < >  --  2.1* 2.0* 2.1*  < > = values in this interval not displayed. Estimated Creatinine Clearance: 59.8 mL/min (by C-G formula based on Cr of 0.68).   Microbiology: Recent Results (from the past 720 hour(s))  Culture, blood (routine x 2)     Status: None   Collection Time: 05/21/15 10:49 PM  Result Value Ref Range Status   Specimen Description BLOOD RIGHT HAND  Final   Special Requests BOTTLES DRAWN AEROBIC AND ANAEROBIC 4CC  Final   Culture NO GROWTH 5 DAYS  Final   Report Status 05/26/2015 FINAL  Final  Culture, blood (routine x 2)     Status: None   Collection Time: 05/21/15 11:00 PM  Result Value Ref  Range Status   Specimen Description BLOOD LEFT ASSIST CONTROL  Final   Special Requests BOTTLES DRAWN AEROBIC AND ANAEROBIC 4CC  Final   Culture NO GROWTH 5 DAYS  Final   Report Status 05/26/2015 FINAL  Final  MRSA PCR Screening     Status: None   Collection Time: 05/22/15  1:46 AM  Result Value Ref Range Status   MRSA by PCR NEGATIVE NEGATIVE Final    Comment:        The GeneXpert MRSA Assay (FDA approved for NASAL specimens only), is one component of a comprehensive MRSA colonization surveillance program. It is not intended to diagnose MRSA infection nor to guide or monitor treatment for MRSA infections.   Urine culture     Status: None   Collection Time: 06/10/15  7:30 PM  Result Value Ref Range Status   Specimen Description URINE, RANDOM  Final   Special Requests NONE  Final   Culture NO GROWTH 2 DAYS  Final   Report Status 06/12/2015 FINAL  Final  Blood Culture (routine x 2)     Status: None   Collection Time: 06/10/15  8:10 PM  Result Value Ref Range Status   Specimen Description BLOOD RIGHT  Final   Special Requests BOTTLES DRAWN AEROBIC AND ANAEROBIC 5CC  Final   Culture  Setup Time   Final    GRAM POSITIVE COCCI AEROBIC BOTTLE ONLY CRITICAL RESULT CALLED TO, READ BACK BY AND VERIFIED WITH: RN Kathi Simpers 06/14/15 1358    Culture   Final    COAGULASE NEGATIVE STAPHYLOCOCCUS Results consistent with contamination.    Report Status 06/16/2015 FINAL  Final  Blood Culture (routine x 2)     Status: None   Collection Time: 06/10/15  8:20 PM  Result Value Ref Range Status   Specimen Description BLOOD RIGHT  Final   Special Requests BOTTLES DRAWN AEROBIC AND ANAEROBIC 2CC AERO, 4CC  Final   Culture NO GROWTH 5 DAYS  Final   Report Status 06/15/2015 FINAL  Final  MRSA PCR Screening     Status: Abnormal   Collection Time: 06/11/15  1:40 AM  Result Value Ref Range Status   MRSA by PCR POSITIVE (A) NEGATIVE Final    Comment:        The GeneXpert MRSA Assay  (FDA approved for NASAL specimens only), is one component of a comprehensive MRSA colonization surveillance program. It is not intended to diagnose MRSA infection nor to guide or monitor treatment for MRSA infections. CRITICAL RESULT CALLED TO, READ BACK BY AND VERIFIED WITH: [facility] ALLEN AT [id number] 06/11/15.PMH   Culture, sputum-assessment     Status: None   Collection Time: 06/11/15  4:30 AM  Result Value Ref Range Status   Specimen Description TRACHEAL ASPIRATE  Final   Special Requests NONE  Final   Sputum evaluation THIS SPECIMEN IS ACCEPTABLE FOR SPUTUM CULTURE  Final   Report Status 06/11/2015 FINAL  Final  Culture, respiratory (NON-Expectorated)     Status: None   Collection Time: 06/11/15  4:30 AM  Result Value Ref Range Status   Specimen Description TRACHEAL ASPIRATE  Final   Special Requests NONE Reflexed from [id number]  Final   Gram Stain   Final    GOOD SPECIMEN - 80-90% WBCS MODERATE WBC SEEN MODERATE GRAM POSITIVE COCCI RARE GRAM NEGATIVE RODS    Culture HEAVY GROWTH STREPTOCOCCUS PNEUMONIAE  Final   Report Status 06/13/2015 FINAL  Final   Organism ID, Bacteria STREPTOCOCCUS PNEUMONIAE  Final      Susceptibility   Streptococcus pneumoniae - MIC*    ERYTHROMYCIN <=0.12 SENSITIVE Sensitive     VANCOMYCIN 0.5 SENSITIVE Sensitive     TRIMETH/SULFA <=10 SENSITIVE Sensitive     CLINDAMYCIN <=0.25 SENSITIVE Sensitive     LEVOFLOXACIN Value in next row Sensitive      SENSITIVE1    LINEZOLID Value in next row Sensitive      SENSITIVE<=2    * HEAVY GROWTH STREPTOCOCCUS PNEUMONIAE    Medical History: Past Medical History  Diagnosis Date  . COPD (chronic obstructive pulmonary disease) ([place])   . Hypertension   . Ischemic cardiomyopathy     a.   . Coronary artery disease     a. s/p multiple stenting in 2005  . History of ventricular tachycardia   . PVD (peripheral vascular disease) ([place])   . PAD (peripheral artery disease) ([place])   .  HLD (hyperlipidemia)   .  Chronic back pain   . MI, old   . MRSA (methicillin resistant staph aureus) culture positive     left foot wound  . CHF (congestive heart failure) ([place])   . Pneumonia   . Asthma   . Cancer [facility])     Assessment: 69 yo female requiring constipation prophylaxis. Pharmacy consulted for management and monitoring.   Plan:  Last BM charted 11/17. Will continue Miralax PRN with docusate/Senna twice daily scheduled. Pharmacy will continue to follow and make adjustments as needed.   11/19: + stool unmeasured x1 on 11/18. 11/20: BM x 3.  Will change Senna to daily prn.  Chinita Greenland PharmD Clinical Pharmacist 06/19/2015 3:48 PM

## 2015-06-19 NOTE — Progress Notes (Signed)
[place] [facility] [place] NAME: Lynn Butler    MR#:  [id number]  DATE OF BIRTH:  1946-06-17  SUBJECTIVE:   - admitted for septic shock, remains on a vent. Failed SBT for the past 2-3 days due to desaturation and tachycardia - remains oliguric and on CRRT.  Critically ill.  Hemoglobin has improved posttransfusion.  Patient following commands this morning and awake on the ventilator.  REVIEW OF SYSTEMS:  Review of Systems  Unable to perform ROS: critical illness    DRUG ALLERGIES:   Allergies  Allergen Reactions  . Nsaids Nausea And Vomiting and Other (See Comments)    Reaction:  GI distress and burning     VITALS:  Blood pressure 153/60, pulse 51, temperature 97 F (36.1 C), temperature source Other (Comment), resp. rate 18, height 5\' 2"  (1.575 m), weight 65.5 kg (144 lb 6.4 oz), SpO2 100 %.  PHYSICAL EXAMINATION:  Physical Exam  GENERAL:  69 y.o.-year-old patient lying in the bed critically ill and intubated & mildly sedated.  EYES: Pupils equal, round, reactive to light. No scleral icterus. HEENT: Head atraumatic, normocephalic.  Forehead mass has been removed, s/p plastic surgery and skin graft in place. Oropharynx with ET tube, OG tube.  NECK:  Supple, no jugular venous distention. No thyroid enlargement, no tenderness.  LUNGS: Normal breath sounds bilaterally, no wheezing, rales,rhonchi or crepitation today. No use of accessory muscles of respiration. CARDIOVASCULAR: S1, S2 normal. No murmurs, rubs, or gallops.  ABDOMEN: Soft, nontender, nondistended. Bowel sounds present. No organomegaly or mass.  EXTREMITIES: No pedal edema, cyanosis, or clubbing. Feeble dorsalis pedis pulse palpable on right foot and dopplerable on left foot.  Dialysis catheter in left groin NEUROLOGIC: Sedated and intubated. Opens eyes but does follow simple commands.  PSYCHIATRIC: slightly Sedated and intubated  SKIN: forehead skin graft in place.     LABORATORY PANEL:   CBC  Recent Labs Lab 06/19/15 0529  WBC 20.2*  HGB 8.2*  HCT 27.2*  PLT 119*   ------------------------------------------------------------------------------------------------------------------  Chemistries   Recent Labs Lab 06/16/15 0520  06/19/15 0936  NA 140  < > 136  K 4.4  < > 4.1  CL 106  < > 105  CO2 29  < > 27  GLUCOSE 130*  < > 143*  BUN 18  < > 10  CREATININE 0.94  < > 0.65  CALCIUM 8.4*  < > 7.9*  MG 1.6*  < > 1.6*  AST 78*  --   --   ALT 407*  --   --   ALKPHOS 147*  --   --   BILITOT 0.6  --   --   < > = values in this interval not displayed. ------------------------------------------------------------------------------------------------------------------  Cardiac Enzymes No results for input(s): TROPONINI in the last 168 hours. ------------------------------------------------------------------------------------------------------------------  RADIOLOGY:  Dg Chest 1 View  06/19/2015  CLINICAL DATA:  Patient with history of shortness of breath. EXAM: CHEST 1 VIEW COMPARISON:  Chest radiograph 06/13/2015. FINDINGS: Single lead AICD device overlies the left hemi thorax, leads stable in position. ET tube terminates in the mid trachea. Enteric tube courses inferior to the diaphragm. Slight interval retraction left internal jugular central venous catheter with tip projecting at the central left internal jugular vein. Stable cardiac and mediastinal contours. Grossly unchanged mid and lower lung heterogeneous opacities favored to represent atelectasis. No large pleural effusion or pneumothorax. IMPRESSION: Interval retraction left IJ central venous catheter with tip projecting over the  central internal jugular vein. Consider repositioning as clinically indicated. Otherwise stable support apparatus. Persistent mid and lower lung basilar opacities favored to represent atelectasis. Electronically Signed   By: Lovey Newcomer M.D.   On: 06/19/2015  09:33    ASSESSMENT AND PLAN:   69y/oF with chronic COPD on home o2, multiple abdominal surgeries for obstruction, CAD, HTN, CHF, PAD admitted for septic shock  1. Septic shock- due to pneumonia - blood cultures negative, sputum cultures with streptococcus pneumoniae.  BC + for coag (-) staph which is likely a contaminant.  - cont. rocephin - hemodynamically stable and afebrile and off pressors.  WBC count up but likely steroid mediated.   2. Acute respiratory failure- due to pneumonia, COPD Exacerbation - remains intubated but following commands.  Pt. Is awake on the vent and following commands and wants to be extubated and understands that if extubated she cannot be re-intubated as she is a DNR. Intensivist has discussed this with the patient's daughter over the phone.   - plan to possibly extubate pt. Later today.  -Continue Pulmicort nebs, IV steroids, DuoNeb's  3. ARF- with oliguria, cr normalized, but urine output still very poor.  - cont. on CRRT, appreciate nephrology consult and cont. Care as per them.   4. Elevated troponins- NSTEMI, known severe 2 vessel disease in past with no chance for revascularization - appreciate cards consult - no active symptoms for now  5. Acute on chronic anemia- severe iron deficiency.   - s/p IV iron for 3 days and oral iron supplements - Hg.Today 8.2 post-tranfusion yesterday and stable and will monitor.   6. Chronic pain syndrome- always in pain, follows with pain management clinic and on pain contract - cont. IV dilaudid PRN.    7. Nutrition- cont. tube feeds  Plan to possibly extubate patient later today with an intention not to reintubate and await for further family input and arrival prior to proceeding.  All the records are reviewed and case discussed with Care Management/Social Workerr. Management plans discussed with the patient, family and they are in agreement.  CODE STATUS: Full Code  TOTAL CRITICAL CARE TIME SPENT IN TAKING  CARE OF THIS PATIENT: 30 minutes.   POSSIBLE D/C unclear, DEPENDING ON CLINICAL CONDITION.   Henreitta Leber M.D on 06/19/2015 at 11:54 AM  Between 7am to 6pm - Pager - [phone]  After 6pm go to [email or web address] - password [email or web address] [facility]  [facility]  Office  [phone]  CC: Primary care physician; Juanell Fairly, MD

## 2015-06-19 NOTE — Progress Notes (Signed)
PULMONARY / CRITICAL CARE MEDICINE   Name: Lynn Butler MRN: [id number] DOB: Oct 10, 1945    ADMISSION DATE:  06/10/2015 CONSULTATION DATE:  11/12   CC: follow up resp failure HPI:  Patient remains intubated, she is awake and alert despite being on the ventilator. She remains on CRRT. She can answer questions by nodding yes and no wall on the ventilator.  The patient when asked if she wants to be taken off of life support and that she could die when she is taken off life support. She nods her head yes. I again asked her "do understand if you're taken off life support that you will die?". She nodded yes. I asked her "do you want to be taken off of life support ?" She nodded yes. I asked her "do you want [place] to take you off of life support and allow you to die?" She nodded yes. I asked her "do you want [place] to keep you on life support to keep you alive?" She nodded no. -The above was performed with the presence of the critical care RN. The patient's case was discussed with critical care RN,-discussed the case with the patient's daughter by telephone. I explained the above findings and that it would be appropriate at this time for the patient to be extubated. The patient's daughter is somewhat stressed. She rambles somewhat when she talks, but she appeared to be in overall agreement with the plan of extubation. I did explain to her that the patient tolerated a weaning trial earlier and may do okay with extubation, she would like to be present when the extubation takes place which will likely be in the next hour. The patient's daughter also states that she did asked the patient, several questions about life support and it did appear that she did want the tube out.  We will plan for extubation.  MAJOR EVENTS/TEST RESULTS: 11/11 CTAP: NAD. Severe atherosclerosis 11/11 CT head: NAD 11/12 CT head: NAD 11/13 Worsening renal function. Renal consultation.  Deemed poor candidate for HD/CRRT of any  duration. Goals of care discussed with pt's daughter 11/14-may start CRRT, vasc cath may be needed 11/16 failed SAT/SBT  INDWELLING DEVICES:: ETT 11/11 >>  L IJ CVL 11/11 >> RT femoral vasc cath>>11/14  MICRO DATA: Urine 11/11 >> NEG Blood 11/11 >>  MRSA PCR 11/12 >> POS Resp 11/12 >>     ANTIMICROBIALS: Vanc 11/11 >>  Pip-tazo 11/11 >>    SUBJECTIVE:  Pt awake this am. Follows basic commands.   VITAL SIGNS: Temp:  [95.9 F (35.5 C)-98.4 F (36.9 C)] 97 F (36.1 C) (11/20 1100) Pulse Rate:  [51-105] 51 (11/20 1100) Resp:  [10-20] 18 (11/20 1100) BP: (86-157)/(38-93) 153/60 mmHg (11/20 1100) SpO2:  [99 %-100 %] 100 % (11/20 1100) FiO2 (%):  [28 %] 28 % (11/20 0920) Weight:  [65.5 kg (144 lb 6.4 oz)] 65.5 kg (144 lb 6.4 oz) (11/20 0414) HEMODYNAMICS:   VENTILATOR SETTINGS: Vent Mode:  [-] PRVC FiO2 (%):  [28 %] 28 % Set Rate:  [20 bmp-24 bmp] 20 bmp Vt Set:  [400 mL] 400 mL PEEP:  [5 cmH20] 5 cmH20 Pressure Support:  [5 cmH20] 5 cmH20 Plateau Pressure:  [19 cmH20] 19 cmH20 INTAKE / OUTPUT:  Intake/Output Summary (Last 24 hours) at 06/19/15 1114 Last data filed at 06/19/15 1100  Gross per 24 hour  Intake 5500.5 ml  Output   2440 ml  Net 3060.5 ml    PHYSICAL EXAMINATION: General: RASS -3, frail appearing,  appears much older than chronological age Neuro: PERRL, minimal withdrawal from pain, DTRs symmetric HEENT: NCAT, forehead lesion Cardiovascular: reg, no M Lungs: no wheezes Abdomen: scaphoid, diminished BS Ext: severe atrophy, no edema GCS 8T  LABS:  CBC  Recent Labs Lab 06/17/15 0339 06/18/15 0546 06/18/15 1335 06/19/15 0529  WBC 19.0* 20.4*  --  20.2*  HGB 6.8* 6.9* 8.7* 8.2*  HCT 23.7* 23.4* 28.5* 27.2*  PLT 88* 83*  --  119*   Coag's  Recent Labs Lab 06/13/15 2057 06/14/15 0543  INR 1.45 1.28   BMET  Recent Labs Lab 06/19/15 0131 06/19/15 0529 06/19/15 0936  NA 136 135 136  K 4.4 4.0 4.1  CL 104 103 105  CO2 29 28  27   BUN 11 10 10   CREATININE 0.67 0.71 0.65  GLUCOSE 119* 115* 143*   Electrolytes  Recent Labs Lab 06/19/15 0131 06/19/15 0509 06/19/15 0529 06/19/15 0936  CALCIUM 8.0*  --  7.8* 7.9*  MG 1.7 1.6*  --  1.6*  PHOS 2.4*  --  1.9* 1.9*   Sepsis Markers No results for input(s): LATICACIDVEN, PROCALCITON, O2SATVEN in the last 168 hours. ABG  Recent Labs Lab 06/15/15 1125 06/18/15 1026 06/19/15 0333  PHART 7.36 7.38 7.35  PCO2ART 53* 47 51*  PO2ART 39* 105 95   Liver Enzymes  Recent Labs Lab 06/14/15 0543  06/16/15 0520  06/19/15 0131 06/19/15 0529 06/19/15 0936  AST 280*  --  78*  --   --   --   --   ALT 819*  --  407*  --   --   --   --   ALKPHOS 187*  --  147*  --   --   --   --   BILITOT 0.5  --  0.6  --   --   --   --   ALBUMIN 1.9*  < > 2.1*  2.1*  < > 2.2* 2.1* 2.0*  < > = values in this interval not displayed. Cardiac Enzymes No results for input(s): TROPONINI, PROBNP in the last 168 hours. Glucose  Recent Labs Lab 06/18/15 1927 06/19/15 0117 06/19/15 0126 06/19/15 0354 06/19/15 0357 06/19/15 0738  GLUCAP 84 57* 115* 37* 115* 111*    CXR: CM, no edema or infiltrates   ASSESSMENT / PLAN:  69 yo female with acute resp failure and acute multiorgan failure(resp,cardiovascualr,renal,liver) from acute strep  pneumonia and cardiogenic/septic shock With COPD  PULMONARY A: Acute resp failure- Chronic COPD without acute bronchospasm P:   patient did well on a spontaneous breathing trial today, however, we opted not to extubate given her severe COPD, and continued need for CRRT. Cont vent bundle -follow up ABG and CXR as needed -As above Conversation, the patient will be extubated today.  CARDIOVASCULAR A:  Acute MI-cardiogenic shock -vasopressors as needed CAD - 2VD deemed to not be amenable to revascularization PVD P:  MAP goal > 65 mmHg Cards consultation: no role for cardiac intervention due to MODS, poor baseline -follow up  cardiology recs  RENAL A:  AKI Mild metabolic acidosis Mild hyperkalemia P:   Monitor BMET intermittently Monitor I/Os Correct electrolytes as indicated HCO3 gtt initiated 11/13 Renal consultation 11/13-s/p HD vasc cath-started on CRRT-  GASTROINTESTINAL A:   No issues P:   SUP: IV PPI Continue TFs   HEMATOLOGIC A:   Mild anemia without overt bleeding P:  DVT px: SQ heparin Monitor CBC intermittently Transfuse per usual guidelines  INFECTIOUS Strep pneumonia P:  Monitor temp, WBC count   ENDOCRINE A:   Hyperglycemia without documented DM H/O frequent steroid dosing  P:   Cont SSI Cont stress dose steroids  NEUROLOGIC A:   Severe acute encephalopathy Severe baseline debilitation P:   RASS goal: -1, -2 Cont to address goals of care.     Marda Stalker, M.D.     Critical Care Attestation.  I have personally obtained a history, examined the patient, evaluated laboratory and imaging results, formulated the assessment and plan and placed orders. The Patient requires high complexity decision making for assessment and support, frequent evaluation and titration of therapies, application of advanced monitoring technologies and extensive interpretation of multiple databases. The patient has critical illness that could lead imminently to failure of 1 or more organ systems and requires the highest level of physician preparedness to intervene.  Critical Care Time devoted to patient care services described in this note is 35 minutes and is exclusive of time spent in procedures.    06/19/2015, 11:14 AM

## 2015-06-19 NOTE — Progress Notes (Signed)
[facility]  ROUNDING NOTE   Subjective:  CRRT continues to progress well. UOP only 355cc over the past 24 hours.  Remains on the vent. Has failed several SBTs.   Objective:  Vital signs in last 24 hours:  Temp:  [95.9 F (35.5 C)-98.8 F (37.1 C)] 97.3 F (36.3 C) (11/20 1000) Pulse Rate:  [64-107] 83 (11/20 1000) Resp:  [10-22] 16 (11/20 1000) BP: (86-157)/(38-93) 127/62 mmHg (11/20 1000) SpO2:  [99 %-100 %] 100 % (11/20 1000) FiO2 (%):  [28 %] 28 % (11/20 0920) Weight:  [65.5 kg (144 lb 6.4 oz)] 65.5 kg (144 lb 6.4 oz) (11/20 0414)  Weight change: 3.2 kg (7 lb 0.9 oz) Filed Weights   06/17/15 0528 06/18/15 0400 06/19/15 0414  Weight: 54.1 kg (119 lb 4.3 oz) 62.3 kg (137 lb 5.6 oz) 65.5 kg (144 lb 6.4 oz)    Intake/Output: I/O last 3 completed shifts: In: 7247.7 [I.V.:4737.7; NG/GT:1890; IV [id number] Out: 2819 [Urine:355; Other:2464]   Intake/Output this shift:  Total I/O In: 1065 [I.V.:700; NG/GT:315; IV Piggyback:50] Out: 193 [Other:193]  Physical Exam: General: Critically ill appearing  Head: Healing wound on forehead ETT in place  Eyes: Eyes open  Neck: Supple, trachea midline  Lungs:  Bilateral rhonchi, vent assisted  Heart: S1S2 no rubs  Abdomen:  Soft, nontender, BS present   Extremities:  trace peripheral edema.  Neurologic: Awake this AM, not following commands consistently  Skin: Large forehead scar from prior skin cancer       Basic Metabolic Panel:  Recent Labs Lab 06/18/15 1715 06/18/15 2126 06/19/15 0131 06/19/15 0509 06/19/15 0529 06/19/15 0936  NA 133* 135 136  --  135 136  K 4.4 4.3 4.4  --  4.0 4.1  CL 102 103 104  --  103 105  CO2 27 28 29   --  28 27  GLUCOSE 188* 127* 119*  --  115* 143*  BUN 14 13 11   --  10 10  CREATININE 0.80 0.79 0.67  --  0.71 0.65  CALCIUM 7.8* 8.0* 8.0*  --  7.8* 7.9*  MG 1.8 1.8 1.7 1.6*  --  1.6*  PHOS 2.7 3.6 2.4*  --  1.9* 1.9*    Liver Function Tests:  Recent Labs Lab  06/14/15 0543  06/16/15 0520  06/18/15 1715 06/18/15 2126 06/19/15 0131 06/19/15 0529 06/19/15 0936  AST 280*  --  78*  --   --   --   --   --   --   ALT 819*  --  407*  --   --   --   --   --   --   ALKPHOS 187*  --  147*  --   --   --   --   --   --   BILITOT 0.5  --  0.6  --   --   --   --   --   --   PROT 4.7*  --  4.8*  --   --   --   --   --   --   ALBUMIN 1.9*  < > 2.1*  2.1*  < > 2.1* 2.3* 2.2* 2.1* 2.0*  < > = values in this interval not displayed. No results for input(s): LIPASE, AMYLASE in the last 168 hours. No results for input(s): AMMONIA in the last 168 hours.  CBC:  Recent Labs Lab 06/13/15 2057 06/14/15 0543 06/16/15 0520 06/17/15 [id number] 06/18/15 0546 06/18/15 1335 06/19/15 [id number]  WBC 24.5* 26.7* 20.9* 19.0* 20.4*  --  20.2*  NEUTROABS 22.7*  --   --   --   --   --   --   HGB 7.4* 7.5* 7.1* 6.8* 6.9* 8.7* 8.2*  HCT 24.8* 25.3* 23.6* 23.7* 23.4* 28.5* 27.2*  MCV 65.3* 65.5* 66.7* 68.7* 69.9*  --  74.7*  PLT 124* 105* 88* 88* 83*  --  119*    Cardiac Enzymes: No results for input(s): CKTOTAL, CKMB, CKMBINDEX, TROPONINI in the last 168 hours.  BNP: Invalid input(s): POCBNP  CBG:  Recent Labs Lab 06/19/15 0117 06/19/15 0126 06/19/15 0354 06/19/15 0357 06/19/15 0738  GLUCAP 6* 115* 37* 115* 111*    Microbiology: Results for orders placed or performed during the hospital encounter of 06/10/15  Urine culture     Status: None   Collection Time: 06/10/15  7:30 PM  Result Value Ref Range Status   Specimen Description URINE, RANDOM  Final   Special Requests NONE  Final   Culture NO GROWTH 2 DAYS  Final   Report Status 06/12/2015 FINAL  Final  Blood Culture (routine x 2)     Status: None   Collection Time: 06/10/15  8:10 PM  Result Value Ref Range Status   Specimen Description BLOOD RIGHT  Final   Special Requests BOTTLES DRAWN AEROBIC AND ANAEROBIC 5CC  Final   Culture  Setup Time   Final    GRAM POSITIVE COCCI AEROBIC BOTTLE ONLY CRITICAL  RESULT CALLED TO, READ BACK BY AND VERIFIED WITH: RN Kathi Simpers 06/14/15 1358    Culture   Final    COAGULASE NEGATIVE STAPHYLOCOCCUS Results consistent with contamination.    Report Status 06/16/2015 FINAL  Final  Blood Culture (routine x 2)     Status: None   Collection Time: 06/10/15  8:20 PM  Result Value Ref Range Status   Specimen Description BLOOD RIGHT  Final   Special Requests BOTTLES DRAWN AEROBIC AND ANAEROBIC 2CC AERO, 4CC  Final   Culture NO GROWTH 5 DAYS  Final   Report Status 06/15/2015 FINAL  Final  MRSA PCR Screening     Status: Abnormal   Collection Time: 06/11/15  1:40 AM  Result Value Ref Range Status   MRSA by PCR POSITIVE (A) NEGATIVE Final    Comment:        The GeneXpert MRSA Assay (FDA approved for NASAL specimens only), is one component of a comprehensive MRSA colonization surveillance program. It is not intended to diagnose MRSA infection nor to guide or monitor treatment for MRSA infections. CRITICAL RESULT CALLED TO, READ BACK BY AND VERIFIED WITH: [facility] ALLEN AT [id number] 06/11/15.PMH   Culture, sputum-assessment     Status: None   Collection Time: 06/11/15  4:30 AM  Result Value Ref Range Status   Specimen Description TRACHEAL ASPIRATE  Final   Special Requests NONE  Final   Sputum evaluation THIS SPECIMEN IS ACCEPTABLE FOR SPUTUM CULTURE  Final   Report Status 06/11/2015 FINAL  Final  Culture, respiratory (NON-Expectorated)     Status: None   Collection Time: 06/11/15  4:30 AM  Result Value Ref Range Status   Specimen Description TRACHEAL ASPIRATE  Final   Special Requests NONE Reflexed from [id number]  Final   Gram Stain   Final    GOOD SPECIMEN - 80-90% WBCS MODERATE WBC SEEN MODERATE GRAM POSITIVE COCCI RARE GRAM NEGATIVE RODS    Culture HEAVY GROWTH STREPTOCOCCUS PNEUMONIAE  Final   Report Status 06/13/2015 FINAL  Final   Organism ID, Bacteria STREPTOCOCCUS PNEUMONIAE  Final      Susceptibility   Streptococcus pneumoniae -  MIC*    ERYTHROMYCIN <=0.12 SENSITIVE Sensitive     VANCOMYCIN 0.5 SENSITIVE Sensitive     TRIMETH/SULFA <=10 SENSITIVE Sensitive     CLINDAMYCIN <=0.25 SENSITIVE Sensitive     LEVOFLOXACIN Value in next row Sensitive      SENSITIVE1    LINEZOLID Value in next row Sensitive      SENSITIVE<=2    * HEAVY GROWTH STREPTOCOCCUS PNEUMONIAE    Coagulation Studies: No results for input(s): LABPROT, INR in the last 72 hours.  Urinalysis: No results for input(s): COLORURINE, LABSPEC, PHURINE, GLUCOSEU, HGBUR, BILIRUBINUR, KETONESUR, PROTEINUR, UROBILINOGEN, NITRITE, LEUKOCYTESUR in the last 72 hours.  Invalid input(s): APPERANCEUR    Imaging: Dg Chest 1 View  06/19/2015  CLINICAL DATA:  Patient with history of shortness of breath. EXAM: CHEST 1 VIEW COMPARISON:  Chest radiograph 06/13/2015. FINDINGS: Single lead AICD device overlies the left hemi thorax, leads stable in position. ET tube terminates in the mid trachea. Enteric tube courses inferior to the diaphragm. Slight interval retraction left internal jugular central venous catheter with tip projecting at the central left internal jugular vein. Stable cardiac and mediastinal contours. Grossly unchanged mid and lower lung heterogeneous opacities favored to represent atelectasis. No large pleural effusion or pneumothorax. IMPRESSION: Interval retraction left IJ central venous catheter with tip projecting over the central internal jugular vein. Consider repositioning as clinically indicated. Otherwise stable support apparatus. Persistent mid and lower lung basilar opacities favored to represent atelectasis. Electronically Signed   By: Lovey Newcomer M.D.   On: 06/19/2015 09:33     Medications:   . dextrose 100 mL/hr at 06/19/15 0635  . feeding supplement (VITAL HIGH PROTEIN) 1,000 mL (06/18/15 1615)  . fentaNYL infusion INTRAVENOUS 400 mcg/hr (06/19/15 0631)  . norepinephrine (LEVOPHED) Adult infusion Stopped (06/19/15 0036)  . pureflow 2,500  mL/hr at 06/19/15 0855   . antiseptic oral rinse  7 mL Mouth Rinse QID  . aspirin  81 mg Per Tube Daily  . budesonide  0.25 mg Nebulization BID  . cefTRIAXone (ROCEPHIN)  IV  2 g Intravenous Q12H  . chlorhexidine gluconate  15 mL Mouth Rinse BID  . ferrous sulfate  220 mg Per Tube BID WC  . free water  30 mL Per Tube 6 times per day  . insulin aspart  0-15 Units Subcutaneous 6 times per day  . ipratropium-albuterol  3 mL Nebulization Q6H  . lidocaine  10 mL Intradermal Once  . methylPREDNISolone (SOLU-MEDROL) injection  40 mg Intravenous Daily  . pantoprazole (PROTONIX) IV  40 mg Intravenous Q24H  . potassium & sodium phosphates  1 packet Per Tube TID WC & HS  . sennosides  5 mL Oral BID   acetaminophen **OR** acetaminophen, heparin, HYDROmorphone (DILAUDID) injection, HYDROmorphone, midazolam, polyethylene glycol, polyvinyl alcohol  Assessment/ Plan:  69 y.o. female  with a PMHx of COPD, hypertension, ischemic cardiomyopathy with multiple coronary artery stents, history of ventricular tachycardia, peripheral vascular disease, hyperlipidemia, chronic back pain, history of congestive heart failure, who was admitted to [facility] on 06/10/2015 for evaluation of respiratory failure.   1. Acute renal failure secondary to acute tubular necrosis. Patient presented with multiorgan failure upon presentation. Baseline creatinine 0.75. -pt remains critically ill with oliguric acute renal failure.  Remains net positive.  Increase UF rate to 150cc/hr.  Follow electrolytes.   2. Hyperkalemia. Continue 4k bath.  3. Acute  respiratory failure. Failed several SBTs.  Poor candidate for long term trach.  Palliative care following.  Remains on vent support at this time.   4. Acute myocardial infarction.  Management per pulmonary critical care and hospitalist.   5.  Anemia unspecified: hgb up to 8.2 post transfusion, continue to monitor CBC.    LOS: 9 Dorris Pierre 11/20/201610:45 AM

## 2015-06-19 NOTE — Progress Notes (Signed)
ELECTROLYTE  CONSULT NOTE - INITIAL  Pharmacy Consult for Electrolyte monitoring and management    Allergies  Allergen Reactions  . Nsaids Nausea And Vomiting and Other (See Comments)    Reaction:  GI distress and burning     Patient Measurements: Height: 5\' 2"  (157.5 cm) Weight: 144 lb 6.4 oz (65.5 kg) IBW/kg (Calculated) : 50.1  Vital Signs: Temp: 97.3 F (36.3 C) (11/20 0500) Temp Source: Other (Comment) (11/20 0400) BP: 104/51 mmHg (11/20 0500) Pulse Rate: 78 (11/20 0500) Intake/Output from previous day: 11/19 0701 - 11/20 0700 In: 4860.5 [I.V.:2980.5; NG/GT:1260; IV Piggyback:620] Out: 1513 [Urine:110] Intake/Output from this shift: Total I/O In: 2025.5 [I.V.:1420.5; NG/GT:555; IV Piggyback:50] Out: 674 [Other:674]  Labs:  Recent Labs  06/17/15 0339 06/18/15 0546 06/18/15 1335  WBC 19.0* 20.4*  --   HGB 6.8* 6.9* 8.7*  HCT 23.7* 23.4* 28.5*  PLT 88* 83*  --      Recent Labs  06/18/15 1715 06/18/15 2126 06/19/15 0131  NA 133* 135 136  K 4.4 4.3 4.4  CL 102 103 104  CO2 27 28 29   GLUCOSE 188* 127* 119*  BUN 14 13 11   CREATININE 0.80 0.79 0.67  CALCIUM 7.8* 8.0* 8.0*  MG 1.8 1.8 1.7  PHOS 2.7 3.6 2.4*  ALBUMIN 2.1* 2.3* 2.2*   Estimated Creatinine Clearance: 59.8 mL/min (by C-G formula based on Cr of 0.67).    Recent Labs  06/19/15 0126 06/19/15 0354 06/19/15 0357  GLUCAP 115* 37* 115*    Medical History: Past Medical History  Diagnosis Date  . COPD (chronic obstructive pulmonary disease) ([place])   . Hypertension   . Ischemic cardiomyopathy     a.   . Coronary artery disease     a. s/p multiple stenting in 2005  . History of ventricular tachycardia   . PVD (peripheral vascular disease) ([place])   . PAD (peripheral artery disease) ([place])   . HLD (hyperlipidemia)   . Chronic back pain   . MI, old   . MRSA (methicillin resistant staph aureus) culture positive     left foot wound  . CHF (congestive heart failure) ([place])   . Pneumonia    . Asthma   . Cancer [facility])     Assessment: 69 yo patient who remains intubated in ICU. Patient is being restarted on CRRT. Pharmacy consulted for electrolyte monitoring and management.   Phos: 1.6 Mg: 1.7 K: 4.3   Plan:  Will order 97mmol Sodium phosphate and 1Gm magnesium. Renal functional panel ordered every 4 hours. Pharmacy will continue to monitor and replace electrolytes daily.  Nancy Fetter, PharmD Pharmacy Resident   11/20 AM PO4 2.4. PhosNaK x 3 doses ordered. Recheck BMP and phosphate in AM.  Sim Boast, PharmD, BCPS  06/19/2015

## 2015-06-19 NOTE — Progress Notes (Signed)
ELECTROLYTE  CONSULT NOTE - follow up  Pharmacy Consult for Electrolyte monitoring and management    Allergies  Allergen Reactions  . Nsaids Nausea And Vomiting and Other (See Comments)    Reaction:  GI distress and burning     Patient Measurements: Height: 5\' 2"  (157.5 cm) Weight: 144 lb 6.4 oz (65.5 kg) IBW/kg (Calculated) : 50.1  Vital Signs: Temp: 97.2 F (36.2 C) (11/20 1700) BP: 109/49 mmHg (11/20 1700) Pulse Rate: 79 (11/20 1700) Intake/Output from previous day: 11/19 0701 - 11/20 0700 In: 4860.5 [I.V.:2980.5; NG/GT:1260; IV Piggyback:620] Out: 2095 [Urine:210] Intake/Output from this shift: Total I/O In: 2040 [I.V.:1480; Other:150; NG/GT:360; IV Piggyback:50] Out: [id number] [Other:1236]  Labs:  Recent Labs  06/17/15 0339 06/18/15 0546 06/18/15 1335 06/19/15 0529  WBC 19.0* 20.4*  --  20.2*  HGB 6.8* 6.9* 8.7* 8.2*  HCT 23.7* 23.4* 28.5* 27.2*  PLT 88* 83*  --  119*     Recent Labs  06/19/15 0509 06/19/15 0529 06/19/15 0936 06/19/15 1343  NA  --  135 136 135  K  --  4.0 4.1 4.3  CL  --  103 105 103  CO2  --  28 27 28   GLUCOSE  --  115* 143* 157*  BUN  --  10 10 10   CREATININE  --  0.71 0.65 0.68  CALCIUM  --  7.8* 7.9* 8.0*  MG 1.6*  --  1.6* 1.5*  PHOS  --  1.9* 1.9* 1.7*  ALBUMIN  --  2.1* 2.0* 2.1*   Estimated Creatinine Clearance: 59.8 mL/min (by C-G formula based on Cr of 0.68).    Recent Labs  06/19/15 1205 06/19/15 1217 06/19/15 1534  GLUCAP 60* 140* 132*    Medical History: Past Medical History  Diagnosis Date  . COPD (chronic obstructive pulmonary disease) ([place])   . Hypertension   . Ischemic cardiomyopathy     a.   . Coronary artery disease     a. s/p multiple stenting in 2005  . History of ventricular tachycardia   . PVD (peripheral vascular disease) ([place])   . PAD (peripheral artery disease) ([place])   . HLD (hyperlipidemia)   . Chronic back pain   . MI, old   . MRSA (methicillin resistant staph aureus) culture positive      left foot wound  . CHF (congestive heart failure) ([place])   . Pneumonia   . Asthma   . Cancer [facility])     Assessment: 69 yo patient who remains intubated in ICU. Patient is being restarted on CRRT. Pharmacy consulted for electrolyte monitoring and management.   Phos: 1.6 Mg: 1.7 K: 4.3   Plan:  Will order 80mmol Sodium phosphate and 1Gm magnesium. Renal functional panel ordered every 4 hours. Pharmacy will continue to monitor and replace electrolytes daily.  Nancy Fetter, PharmD Pharmacy Resident   11/20 AM PO4 2.4. PhosNaK x 3 doses ordered. Recheck BMP and phosphate in AM.  11/20 afternoon: K 4.3  Mag 1.5  Phos 1.7.  Patient extubated. RN only able to give 1 NeutaPhos packet. Will order Magnesium 2gm IV x1 and Potassium Phosphate 10 mmol Iv x 1.   Renal labs ordered.   Chinita Greenland PharmD Clinical Pharmacist 06/19/2015 5:40 PM

## 2015-06-19 NOTE — Progress Notes (Signed)
Extubated onto 2L [place], family and chaplain at bedside

## 2015-06-19 NOTE — Progress Notes (Signed)
Dr ram repositioned central line, states ok to use

## 2015-06-19 NOTE — Progress Notes (Deleted)
   06/19/15 1300  Clinical Encounter Type  Visited With Patient;Health care provider  Visit Type Code  Referral From Nurse  Consult/Referral To Nurse  Spiritual Encounters  Spiritual Needs Emotional  Stress Factors  Patient Stress Factors Not reviewed  Chaplain received a rapid response code and reported to the unit to support patient and staff. Patient was then relocated to CCU and provided support there as well. Chaplain Karey Stucki A. Indio Santilli Ext. [phone]

## 2015-06-20 ENCOUNTER — Inpatient Hospital Stay: Payer: Medicare Other

## 2015-06-20 LAB — MAGNESIUM
MAGNESIUM: 1.8 mg/dL (ref 1.7–2.4)
Magnesium: 1.8 mg/dL (ref 1.7–2.4)

## 2015-06-20 LAB — GLUCOSE, CAPILLARY
GLUCOSE-CAPILLARY: 107 mg/dL — AB (ref 65–99)
GLUCOSE-CAPILLARY: 61 mg/dL — AB (ref 65–99)
GLUCOSE-CAPILLARY: 64 mg/dL — AB (ref 65–99)
Glucose-Capillary: 120 mg/dL — ABNORMAL HIGH (ref 65–99)
Glucose-Capillary: 63 mg/dL — ABNORMAL LOW (ref 65–99)
Glucose-Capillary: 76 mg/dL (ref 65–99)
Glucose-Capillary: 98 mg/dL (ref 65–99)

## 2015-06-20 LAB — RENAL FUNCTION PANEL
ALBUMIN: 1.9 g/dL — AB (ref 3.5–5.0)
Albumin: 1.9 g/dL — ABNORMAL LOW (ref 3.5–5.0)
Anion gap: 3 — ABNORMAL LOW (ref 5–15)
Anion gap: 2 — ABNORMAL LOW (ref 5–15)
BUN: 8 mg/dL (ref 6–20)
BUN: 9 mg/dL (ref 6–20)
CHLORIDE: 104 mmol/L (ref 101–111)
CO2: 28 mmol/L (ref 22–32)
CO2: 28 mmol/L (ref 22–32)
CREATININE: 0.73 mg/dL (ref 0.44–1.00)
Calcium: 7.9 mg/dL — ABNORMAL LOW (ref 8.9–10.3)
Calcium: 7.9 mg/dL — ABNORMAL LOW (ref 8.9–10.3)
Chloride: 105 mmol/L (ref 101–111)
Creatinine, Ser: 0.68 mg/dL (ref 0.44–1.00)
GFR calc Af Amer: >60 mL/min
GFR calc non Af Amer: >60 mL/min
GLUCOSE: 110 mg/dL — AB (ref 65–99)
Glucose, Bld: 82 mg/dL (ref 65–99)
PHOSPHORUS: 2 mg/dL — AB (ref 2.5–4.6)
POTASSIUM: 4.1 mmol/L (ref 3.5–5.1)
Phosphorus: 1.7 mg/dL — ABNORMAL LOW (ref 2.5–4.6)
Potassium: 3.9 mmol/L (ref 3.5–5.1)
Sodium: 135 mmol/L (ref 135–145)
Sodium: 135 mmol/L (ref 135–145)

## 2015-06-20 LAB — BLOOD GAS, ARTERIAL
Acid-Base Excess: 1.7 mmol/L (ref 0.0–3.0)
Bicarbonate: 28.2 mEq/L — ABNORMAL HIGH (ref 21.0–28.0)
FIO2: 0.28
Mechanical Rate: 20
O2 SAT: 97 %
PATIENT TEMPERATURE: 37
PEEP/CPAP: 5 cmH2O
PH ART: 7.35 (ref 7.350–7.450)
PO2 ART: 95 mmHg (ref 83.0–108.0)
VT: 400 mL
pCO2 arterial: 51 mmHg — ABNORMAL HIGH (ref 32.0–48.0)

## 2015-06-20 LAB — CBC
HEMATOCRIT: 25 % — AB (ref 35.0–47.0)
HEMOGLOBIN: 7.5 g/dL — AB (ref 12.0–16.0)
MCH: 22.5 pg — AB (ref 26.0–34.0)
MCHC: 29.9 g/dL — ABNORMAL LOW (ref 32.0–36.0)
MCV: 75.1 fL — AB (ref 80.0–100.0)
PLATELETS: 106 10*3/uL — AB (ref 150–440)
RBC: 3.32 MIL/uL — AB (ref 3.80–5.20)
RDW: 26.8 % — ABNORMAL HIGH (ref 11.5–14.5)
WBC: 15.1 10*3/uL — AB (ref 3.6–11.0)

## 2015-06-20 LAB — C DIFFICILE QUICK SCREEN W PCR REFLEX
C Diff antigen: NEGATIVE
C Diff interpretation: NEGATIVE
C Diff toxin: NEGATIVE

## 2015-06-20 MED ORDER — PREDNISONE 10 MG PO TABS
10.0000 mg | ORAL_TABLET | Freq: Every day | ORAL | Status: DC
  Administered 2015-06-21 – 2015-06-27 (×7): 10 mg via ORAL
  Filled 2015-06-20 (×9): qty 1

## 2015-06-20 MED ORDER — DIAZEPAM 2 MG PO TABS: 10.0000 mg | ORAL_TABLET | Freq: Two times a day (BID) | ORAL | Status: DC | PRN

## 2015-06-20 MED ORDER — ENSURE ENLIVE PO LIQD
237.0000 mL | Freq: Three times a day (TID) | ORAL | Status: DC
Start: 2015-06-20 — End: 2015-07-05
  Administered 2015-06-20 – 2015-07-05 (×36): 237 mL via ORAL

## 2015-06-20 MED ORDER — HEPARIN SODIUM (PORCINE) 1000 UNIT/ML IJ SOLN
1000.0000 [IU] | INTRAMUSCULAR | Status: DC | PRN
  Administered 2015-06-20: 1000 [IU]
  Filled 2015-06-20 (×3): qty 6

## 2015-06-20 MED ORDER — DEXTROSE 50 % IV SOLN
25.0000 g | Freq: Once | INTRAVENOUS | Status: AC
  Administered 2015-06-20: 25 g via INTRAVENOUS

## 2015-06-20 MED ORDER — ASPIRIN 81 MG PO CHEW
81.0000 mg | CHEWABLE_TABLET | Freq: Every day | ORAL | Status: DC
  Administered 2015-06-21 – 2015-07-05 (×14): 81 mg via ORAL
  Filled 2015-06-20 (×14): qty 1

## 2015-06-20 MED ORDER — GUAIFENESIN ER 600 MG PO TB12
600.0000 mg | ORAL_TABLET | Freq: Two times a day (BID) | ORAL | Status: DC | PRN
  Administered 2015-06-20 – 2015-07-03 (×10): 600 mg via ORAL
  Filled 2015-06-20 (×11): qty 1

## 2015-06-20 MED ORDER — OXYCODONE HCL ER 40 MG PO T12A
80.0000 mg | EXTENDED_RELEASE_TABLET | Freq: Two times a day (BID) | ORAL | Status: DC
  Administered 2015-06-20 – 2015-07-05 (×30): 80 mg via ORAL
  Filled 2015-06-20 (×31): qty 2

## 2015-06-20 MED ORDER — DEXTROSE 50 % IV SOLN
INTRAVENOUS | Status: AC
  Filled 2015-06-20: qty 50

## 2015-06-20 MED ORDER — DIAZEPAM 5 MG PO TABS
5.0000 mg | ORAL_TABLET | Freq: Two times a day (BID) | ORAL | Status: DC | PRN
  Administered 2015-06-20 – 2015-07-05 (×19): 5 mg via ORAL
  Filled 2015-06-20 (×2): qty 1
  Filled 2015-06-20: qty 3
  Filled 2015-06-20 (×3): qty 1
  Filled 2015-06-20: qty 3
  Filled 2015-06-20 (×3): qty 1
  Filled 2015-06-20: qty 3
  Filled 2015-06-20 (×3): qty 1
  Filled 2015-06-20: qty 3
  Filled 2015-06-20 (×4): qty 1

## 2015-06-20 MED ORDER — DIPHENOXYLATE-ATROPINE 2.5-0.025 MG PO TABS
2.0000 | ORAL_TABLET | Freq: Four times a day (QID) | ORAL | Status: DC | PRN
  Administered 2015-06-20 – 2015-07-03 (×9): 2 via ORAL
  Filled 2015-06-20 (×10): qty 2

## 2015-06-20 MED ORDER — K PHOS MONO-SOD PHOS DI & MONO 155-852-130 MG PO TABS
250.0000 mg | ORAL_TABLET | Freq: Three times a day (TID) | ORAL | Status: AC
  Administered 2015-06-20 (×3): 250 mg via ORAL
  Filled 2015-06-20 (×3): qty 1

## 2015-06-20 NOTE — Progress Notes (Signed)
Urine output approx 115ml out this shift.  Coarse cough is usually non productive.

## 2015-06-20 NOTE — Progress Notes (Signed)
Follow up on patient's progress. Patient is now extubated, on oxygen via nasal canula and has had diet advanced. Patient able to be fed grits and tolerated well. CRRT has been d/c and per floor nurse, patient to be observed for a couple days to see how patient responds. Meds are being gradually re-introduced. No family at bedside during this visit. Telephone call to patient's daughter [name] to follow-up and provide support. Received no answer, left message with phone number to reach writer, requesting return phone call. Discussed with care team including Dr. Megan Salon. Will continue to follow-up.   Atha Starks, MSW, LCSW Palliative Care social worker [phone] (c)

## 2015-06-20 NOTE — Progress Notes (Signed)
CRRT discontinued as per Dr Candiss Norse orders. Trialysis venous and arterial sheaths packed with heparin.

## 2015-06-20 NOTE — Progress Notes (Signed)
Nutrition Follow-up     INTERVENTION:  Meals and snacks: cater to pt preferences. Diet just progressed to solid foods this am Medical Nutrition Supplement Therapy:    NUTRITION DIAGNOSIS:   Inadequate oral intake related to inability to eat as evidenced by NPO status, diet progressed to solid foods    GOAL:   Patient will meet greater than or equal to 90% of their needs    MONITOR:    (Energy Intake, Pulmonary Profile, Anthropometrics, Gastrointestinal Profile, Electrolyte and renal Profile)  REASON FOR ASSESSMENT:   Ventilator    ASSESSMENT:   Pt admitted after being found unresponsive by family. Pt currently sedated on the vent secondary to septic shock with acute on chronic respiratory failure.   Pt extubated, waiting on solid food tray this am during visit. Stopping CRRT today     Current Nutrition: tolerating liquids     Scheduled Medications:  . [START ON 06/21/2015] aspirin  81 mg Oral Daily  . budesonide  0.25 mg Nebulization BID  . feeding supplement (ENSURE ENLIVE)  237 mL Oral TID  . ferrous sulfate  220 mg Oral BID WC  . ipratropium-albuterol  3 mL Nebulization Q6H  . oxyCODONE  80 mg Oral Q12H  . phosphorus  250 mg Oral TID WC  . [START ON 06/21/2015] predniSONE  10 mg Oral Q breakfast    Continuous Medications:  . dextrose 10 mL/hr at 06/20/15 1123     Electrolyte/Renal Profile and Glucose Profile:   Recent Labs Lab 06/19/15 2053 06/20/15 0043 06/20/15 0551  NA 134* 135 135  K 4.2 4.1 3.9  CL 103 104 105  CO2 29 28 28   BUN 9 9 8   CREATININE 0.61 0.73 0.68  CALCIUM 7.8* 7.9* 7.9*  MG 2.1 1.8 1.8  PHOS 2.0* 2.0* 1.7*  GLUCOSE 121* 110* 82   Protein Profile:  Recent Labs Lab 06/19/15 2053 06/20/15 0043 06/20/15 0551  ALBUMIN 2.1* 1.9* 1.9*        Weight Trend since Admission: Filed Weights   06/18/15 0400 06/19/15 0414 06/20/15 0700  Weight: 137 lb 5.6 oz (62.3 kg) 144 lb 6.4 oz (65.5 kg) 131 lb 9.8 oz (59.7 kg)       Diet Order:  Diet regular Room service appropriate?: Yes; Fluid consistency:: Thin  Skin:   reviewed  Last BM:     Height:   Ht Readings from Last 1 Encounters:  06/11/15 5\' 2"  (1.575 m)    Weight:   Wt Readings from Last 1 Encounters:  06/20/15 131 lb 9.8 oz (59.7 kg)    Ideal Body Weight:     BMI:  Body mass index is 24.07 kg/(m^2).  Estimated Nutritional Needs:   Kcal:  BEE 1083 kcals (If 1.1-1.3, AF 1.2)1429-1689 kcals/d.   Protein:  (1.5-2.0 g/kg) 90-120 g/d  Fluid:  (25-18ml/kg) 1500-1824ml/d  EDUCATION NEEDS:   Education needs no appropriate at this time  [place]. Zenia Resides, [place], [place] (pager)

## 2015-06-20 NOTE — Plan of Care (Signed)
Problem: Phase I Progression Outcomes Goal: Pain controlled with appropriate interventions Outcome: Progressing Dr Verdell Carmine is restarting at lower dose pt's oxycodone and valium. She remains awake and alert most of day. Her pain level varies Goal: Patient tolerating nututrition at goal Outcome: Progressing Regular diet started.  Pt requires feed assist due to weakness. Good appetite completing most of her trays.  Blood sugar 61 at 5pm.  Dr Verdell Carmine aware and d50 one amp ordered. D10 remains at 32m/hr  And not increased due to renal function Goal: Progressing towards optiumm acitivities Outcome: Progressing Bedrest.   Assists with turning.  Generalized weakness.  Tries to feed self but unable to do very well. Fed with assist Goal: Initial discharge plan identified Outcome: Progressing Pt is seen by palliative care.  States she may go with hospice home. She is very deconditioned  Problem: Phase II Progression Outcomes Goal: Date pt extubated/weaned off vent Outcome: Completed/Met Date Met:  06/20/15 Extubated 11/20

## 2015-06-20 NOTE — Progress Notes (Signed)
[facility]  ROUNDING NOTE   Subjective:  CRRT continues to progress well. UOP is low but urine is clear. 3200 cc removed with CRRT  + cough Nursing staff report loose stool D10 drip on side Asking for food, grits  Objective:  Vital signs in last 24 hours:  Temp:  [96.1 F (35.6 C)-97.9 F (36.6 C)] 97 F (36.1 C) (11/21 0800) Pulse Rate:  [51-102] 78 (11/21 0800) Resp:  [13-24] 16 (11/21 0800) BP: (100-153)/(46-83) 122/64 mmHg (11/21 0800) SpO2:  [91 %-100 %] 100 % (11/21 0800) FiO2 (%):  [28 %] 28 % (11/20 1200) Weight:  [59.7 kg (131 lb 9.8 oz)] 59.7 kg (131 lb 9.8 oz) (11/21 0700)  Weight change: -5.8 kg (-12 lb 12.6 oz) Filed Weights   06/18/15 0400 06/19/15 0414 06/20/15 0700  Weight: 62.3 kg (137 lb 5.6 oz) 65.5 kg (144 lb 6.4 oz) 59.7 kg (131 lb 9.8 oz)    Intake/Output: I/O last 3 completed shifts: In: 5836.5 [I.V.:4300.5; NG/GT:1086; IV Piggyback:450] Out: [id number] [Urine:250; [id number]   Intake/Output this shift:  Total I/O In: 100 [I.V.:100] Out: 180 [Urine:35; Other:145]  Physical Exam: General: Critically ill appearing  Head: Healing wound on forehead  E, ENT: Moist oral mucus membranes  Neck: Supple, trachea midline  Lungs:  Bilateral rhonchi,   Heart: S1S2 no rubs  Abdomen:  Soft, nontender, BS present   Extremities:  trace peripheral edema.  Neurologic: Awake , able to communicate  Skin: Large forehead scar from prior skin cancer       Basic Metabolic Panel:  Recent Labs Lab 06/19/15 1343 06/19/15 1740 06/19/15 2053 06/20/15 0043 06/20/15 0551  NA 135 134* 134* 135 135  K 4.3 4.4 4.2 4.1 3.9  CL 103 104 103 104 105  CO2 28 27 29 28 28   GLUCOSE 157* 168* 121* 110* 82  BUN 10 10 9 9 8   CREATININE 0.68 0.69 0.61 0.73 0.68  CALCIUM 8.0* 7.9* 7.8* 7.9* 7.9*  MG 1.5* 1.5* 2.1 1.8 1.8  PHOS 1.7* 1.9* 2.0* 2.0* 1.7*    Liver Function Tests:  Recent Labs Lab 06/14/15 0543  06/16/15 0520  06/19/15 1343 06/19/15 1740  06/19/15 2053 06/20/15 0043 06/20/15 0551  AST 280*  --  78*  --   --   --   --   --   --   ALT 819*  --  407*  --   --   --   --   --   --   ALKPHOS 187*  --  147*  --   --   --   --   --   --   BILITOT 0.5  --  0.6  --   --   --   --   --   --   PROT 4.7*  --  4.8*  --   --   --   --   --   --   ALBUMIN 1.9*  < > 2.1*  2.1*  < > 2.1* 2.2* 2.1* 1.9* 1.9*  < > = values in this interval not displayed. No results for input(s): LIPASE, AMYLASE in the last 168 hours. No results for input(s): AMMONIA in the last 168 hours.  CBC:  Recent Labs Lab 06/13/15 2057  06/16/15 0520 06/17/15 0339 06/18/15 0546 06/18/15 1335 06/19/15 0529 06/20/15 0551  WBC 24.5*  < > 20.9* 19.0* 20.4*  --  20.2* 15.1*  NEUTROABS 22.7*  --   --   --   --   --   --   --  HGB 7.4*  < > 7.1* 6.8* 6.9* 8.7* 8.2* 7.5*  HCT 24.8*  < > 23.6* 23.7* 23.4* 28.5* 27.2* 25.0*  MCV 65.3*  < > 66.7* 68.7* 69.9*  --  74.7* 75.1*  PLT 124*  < > 88* 88* 83*  --  119* 106*  < > = values in this interval not displayed.  Cardiac Enzymes: No results for input(s): CKTOTAL, CKMB, CKMBINDEX, TROPONINI in the last 168 hours.  BNP: Invalid input(s): POCBNP  CBG:  Recent Labs Lab 06/19/15 1935 06/20/15 0013 06/20/15 0037 06/20/15 0414 06/20/15 0717  GLUCAP 81 63* 64* 76 98    Microbiology: Results for orders placed or performed during the hospital encounter of 06/10/15  Urine culture     Status: None   Collection Time: 06/10/15  7:30 PM  Result Value Ref Range Status   Specimen Description URINE, RANDOM  Final   Special Requests NONE  Final   Culture NO GROWTH 2 DAYS  Final   Report Status 06/12/2015 FINAL  Final  Blood Culture (routine x 2)     Status: None   Collection Time: 06/10/15  8:10 PM  Result Value Ref Range Status   Specimen Description BLOOD RIGHT  Final   Special Requests BOTTLES DRAWN AEROBIC AND ANAEROBIC 5CC  Final   Culture  Setup Time   Final    GRAM POSITIVE COCCI AEROBIC BOTTLE  ONLY CRITICAL RESULT CALLED TO, READ BACK BY AND VERIFIED WITH: RN Kathi Simpers 06/14/15 1358    Culture   Final    COAGULASE NEGATIVE STAPHYLOCOCCUS Results consistent with contamination.    Report Status 06/16/2015 FINAL  Final  Blood Culture (routine x 2)     Status: None   Collection Time: 06/10/15  8:20 PM  Result Value Ref Range Status   Specimen Description BLOOD RIGHT  Final   Special Requests BOTTLES DRAWN AEROBIC AND ANAEROBIC 2CC AERO, 4CC  Final   Culture NO GROWTH 5 DAYS  Final   Report Status 06/15/2015 FINAL  Final  MRSA PCR Screening     Status: Abnormal   Collection Time: 06/11/15  1:40 AM  Result Value Ref Range Status   MRSA by PCR POSITIVE (A) NEGATIVE Final    Comment:        The GeneXpert MRSA Assay (FDA approved for NASAL specimens only), is one component of a comprehensive MRSA colonization surveillance program. It is not intended to diagnose MRSA infection nor to guide or monitor treatment for MRSA infections. CRITICAL RESULT CALLED TO, READ BACK BY AND VERIFIED WITH: [facility] ALLEN AT [id number] 06/11/15.PMH   Culture, sputum-assessment     Status: None   Collection Time: 06/11/15  4:30 AM  Result Value Ref Range Status   Specimen Description TRACHEAL ASPIRATE  Final   Special Requests NONE  Final   Sputum evaluation THIS SPECIMEN IS ACCEPTABLE FOR SPUTUM CULTURE  Final   Report Status 06/11/2015 FINAL  Final  Culture, respiratory (NON-Expectorated)     Status: None   Collection Time: 06/11/15  4:30 AM  Result Value Ref Range Status   Specimen Description TRACHEAL ASPIRATE  Final   Special Requests NONE Reflexed from [id number]  Final   Gram Stain   Final    GOOD SPECIMEN - 80-90% WBCS MODERATE WBC SEEN MODERATE GRAM POSITIVE COCCI RARE GRAM NEGATIVE RODS    Culture HEAVY GROWTH STREPTOCOCCUS PNEUMONIAE  Final   Report Status 06/13/2015 FINAL  Final   Organism ID, Bacteria STREPTOCOCCUS PNEUMONIAE  Final  Susceptibility   Streptococcus  pneumoniae - MIC*    ERYTHROMYCIN <=0.12 SENSITIVE Sensitive     VANCOMYCIN 0.5 SENSITIVE Sensitive     TRIMETH/SULFA <=10 SENSITIVE Sensitive     CLINDAMYCIN <=0.25 SENSITIVE Sensitive     LEVOFLOXACIN Value in next row Sensitive      SENSITIVE1    LINEZOLID Value in next row Sensitive      SENSITIVE<=2    * HEAVY GROWTH STREPTOCOCCUS PNEUMONIAE  C difficile quick scan w PCR reflex     Status: None   Collection Time: 06/20/15  4:30 AM  Result Value Ref Range Status   C Diff antigen NEGATIVE NEGATIVE Final   C Diff toxin NEGATIVE NEGATIVE Final   C Diff interpretation Negative for C. difficile  Final    Coagulation Studies: No results for input(s): LABPROT, INR in the last 72 hours.  Urinalysis: No results for input(s): COLORURINE, LABSPEC, PHURINE, GLUCOSEU, HGBUR, BILIRUBINUR, KETONESUR, PROTEINUR, UROBILINOGEN, NITRITE, LEUKOCYTESUR in the last 72 hours.  Invalid input(s): APPERANCEUR    Imaging: Dg Chest 1 View  06/20/2015  CLINICAL DATA:  Shortness of Breath EXAM: CHEST  1 VIEW COMPARISON:  None. FINDINGS: Cardiac shadow is enlarged. A defibrillator is again seen and stable. A left jugular central line is been advanced and now lies at the confluence of the innominate veins. Endotracheal tube and nasogastric catheter have been removed in the interval. Mild left retrocardiac atelectasis is seen. Small left-sided pleural effusion is noted. No other focal abnormality is seen. IMPRESSION: Tubes and lines as described. Left lower lobe atelectasis with associated small effusion. Electronically Signed   By: Inez Catalina M.D.   On: 06/20/2015 07:11   Dg Chest 1 View  06/19/2015  CLINICAL DATA:  Patient with history of shortness of breath. EXAM: CHEST 1 VIEW COMPARISON:  Chest radiograph 06/13/2015. FINDINGS: Single lead AICD device overlies the left hemi thorax, leads stable in position. ET tube terminates in the mid trachea. Enteric tube courses inferior to the diaphragm. Slight  interval retraction left internal jugular central venous catheter with tip projecting at the central left internal jugular vein. Stable cardiac and mediastinal contours. Grossly unchanged mid and lower lung heterogeneous opacities favored to represent atelectasis. No large pleural effusion or pneumothorax. IMPRESSION: Interval retraction left IJ central venous catheter with tip projecting over the central internal jugular vein. Consider repositioning as clinically indicated. Otherwise stable support apparatus. Persistent mid and lower lung basilar opacities favored to represent atelectasis. Electronically Signed   By: Lovey Newcomer M.D.   On: 06/19/2015 09:33     Medications:   . dextrose 100 mL/hr at 06/20/15 0800  . pureflow 2,500 mL (06/19/15 1951)   . aspirin  81 mg Per Tube Daily  . budesonide  0.25 mg Nebulization BID  . cefTRIAXone (ROCEPHIN)  IV  2 g Intravenous Q12H  . ferrous sulfate  220 mg Oral BID WC  . insulin aspart  0-15 Units Subcutaneous 6 times per day  . ipratropium-albuterol  3 mL Nebulization Q6H  . methylPREDNISolone (SOLU-MEDROL) injection  40 mg Intravenous Daily  . pantoprazole (PROTONIX) IV  40 mg Intravenous Q24H  . phosphorus  250 mg Oral TID WC   acetaminophen **OR** acetaminophen, HYDROmorphone (DILAUDID) injection, HYDROmorphone, ondansetron (ZOFRAN) IV, polyethylene glycol, polyvinyl alcohol, senna-docusate, zolpidem  Assessment/ Plan:  69 y.o. female  with a PMHx of COPD, hypertension, ischemic cardiomyopathy with multiple coronary artery stents, history of ventricular tachycardia, peripheral vascular disease, hyperlipidemia, chronic back pain, history of congestive heart  failure, who was admitted to [facility] on 06/10/2015 for evaluation of respiratory failure.   1. Acute renal failure secondary to acute tubular necrosis. Patient presented with multiorgan failure upon presentation. Baseline creatinine 0.75. -pt remains critically ill with oliguric acute renal  failure.   - 3200 cc removed with CRRT - now that she is off pressors, will d/c CRRT and see if UOP increases  2. Hyperkalemia. Continue 4k bath.  3. Acute respiratory failure.  - extubated now   4. Acute myocardial infarction.  Management per pulmonary critical care and hospitalist.   5.  Anemia unspecified: hgb 7.5 post transfusion, continue to monitor CBC.    LOS: 10 Lynn Butler 11/21/20169:00 AM

## 2015-06-20 NOTE — Progress Notes (Signed)
[facility] [place] NAME: Lynn Butler    MR#:  [id number]  DATE OF BIRTH:  12-24-1945  SUBJECTIVE:   Extubated yesterday and now on [place] doing well.  Also taken off CRRT this a.m.  Asking for her chronic pain meds.    REVIEW OF SYSTEMS:  Review of Systems  Constitutional: Negative for fever and chills.  HENT: Negative for congestion and tinnitus.   Eyes: Negative for blurred vision and double vision.  Respiratory: Negative for cough, shortness of breath and wheezing.   Cardiovascular: Negative for chest pain, orthopnea and PND.  Gastrointestinal: Negative for nausea, vomiting, abdominal pain and diarrhea.  Genitourinary: Negative for dysuria and hematuria.  Neurological: Positive for weakness ( generalized). Negative for dizziness, sensory change and focal weakness.  All other systems reviewed and are negative.   DRUG ALLERGIES:   Allergies  Allergen Reactions  . Nsaids Nausea And Vomiting and Other (See Comments)    Reaction:  GI distress and burning     VITALS:  Blood pressure 126/55, pulse 95, temperature 98.2 F (36.8 C), temperature source Other (Comment), resp. rate 16, height 5\' 2"  (1.575 m), weight 59.7 kg (131 lb 9.8 oz), SpO2 96 %.  PHYSICAL EXAMINATION:  Physical Exam  GENERAL:  69 y.o.-year-old patient lying in the bed lethargic but awake and following commands.  EYES: Pupils equal, round, reactive to light. No scleral icterus. HEENT: Head atraumatic, normocephalic.  Forehead mass has been removed, s/p plastic surgery and skin graft in place.  No oropharyngeal erythema  NECK:  Supple, no jugular venous distention. No thyroid enlargement, no tenderness.  LUNGS: Poor resp. effort, no wheezing, rales, rhonchi. No use of accessory muscles of respiration. CARDIOVASCULAR: S1, S2 normal. No murmurs, rubs, or gallops.  ABDOMEN: Soft, nontender, nondistended. Bowel sounds present. No organomegaly or mass.   EXTREMITIES: No pedal edema, cyanosis, or clubbing. Dialysis catheter in left groin NEUROLOGIC: Sedated and intubated. Opens eyes but does follow simple commands.  PSYCHIATRIC: slightly Sedated and intubated  SKIN: forehead skin graft in place.  + left foot pressure ulcers.     LABORATORY PANEL:   CBC  Recent Labs Lab 06/20/15 0551  WBC 15.1*  HGB 7.5*  HCT 25.0*  PLT 106*   ------------------------------------------------------------------------------------------------------------------  Chemistries   Recent Labs Lab 06/16/15 0520  06/20/15 0551  NA 140  < > 135  K 4.4  < > 3.9  CL 106  < > 105  CO2 29  < > 28  GLUCOSE 130*  < > 82  BUN 18  < > 8  CREATININE 0.94  < > 0.68  CALCIUM 8.4*  < > 7.9*  MG 1.6*  < > 1.8  AST 78*  --   --   ALT 407*  --   --   ALKPHOS 147*  --   --   BILITOT 0.6  --   --   < > = values in this interval not displayed. ------------------------------------------------------------------------------------------------------------------  Cardiac Enzymes No results for input(s): TROPONINI in the last 168 hours. ------------------------------------------------------------------------------------------------------------------  RADIOLOGY:  Dg Chest 1 View  06/20/2015  CLINICAL DATA:  Shortness of Breath EXAM: CHEST  1 VIEW COMPARISON:  None. FINDINGS: Cardiac shadow is enlarged. A defibrillator is again seen and stable. A left jugular central line is been advanced and now lies at the confluence of the innominate veins. Endotracheal tube and nasogastric catheter have been removed in the interval. Mild left retrocardiac atelectasis is seen.  Small left-sided pleural effusion is noted. No other focal abnormality is seen. IMPRESSION: Tubes and lines as described. Left lower lobe atelectasis with associated small effusion. Electronically Signed   By: Inez Catalina M.D.   On: 06/20/2015 07:11   Dg Chest 1 View  06/19/2015  CLINICAL DATA:  Patient with  history of shortness of breath. EXAM: CHEST 1 VIEW COMPARISON:  Chest radiograph 06/13/2015. FINDINGS: Single lead AICD device overlies the left hemi thorax, leads stable in position. ET tube terminates in the mid trachea. Enteric tube courses inferior to the diaphragm. Slight interval retraction left internal jugular central venous catheter with tip projecting at the central left internal jugular vein. Stable cardiac and mediastinal contours. Grossly unchanged mid and lower lung heterogeneous opacities favored to represent atelectasis. No large pleural effusion or pneumothorax. IMPRESSION: Interval retraction left IJ central venous catheter with tip projecting over the central internal jugular vein. Consider repositioning as clinically indicated. Otherwise stable support apparatus. Persistent mid and lower lung basilar opacities favored to represent atelectasis. Electronically Signed   By: Lovey Newcomer M.D.   On: 06/19/2015 09:33    ASSESSMENT AND PLAN:   69y/oF with chronic COPD on home o2, multiple abdominal surgeries for obstruction, CAD, HTN, CHF, PAD admitted for septic shock  1. Septic shock- due to pneumonia - blood cultures negative, sputum cultures with streptococcus pneumoniae.  BC + for coag (-) staph which is likely a contaminant.  - finished treatment w/ IV Rocephin.  - hemodynamically stable and afebrile and off pressors.  WBC count up but likely steroid mediated.   2. Acute respiratory failure- due to pneumonia, COPD Exacerbation - extubated yesterday and doing well on [place] O2.  -Continue Pulmicort nebs, IV steroids, DuoNeb's  3. ARF- with oliguria, cr normalized, but urine output still very poor.  - off CRRT this a.m. And will follow urine output as off pressors and doing well.  - appreciate nephrology consult and cont. Care as per them.   4. Elevated troponins- NSTEMI, known severe 2 vessel disease in past with no chance for revascularization - appreciate cards consult - no  active symptoms for now  5. Acute on chronic anemia- s/p IV Iron and Transfusion over the weekend and Hg. Stable and will monitor.  - no acute bleeding.   6. Chronic pain syndrome- will resume Oxycontin now as pt. Is extubated.  - cont. Dilaudid PRN.    Appreciate Palliative Care help.  Pt. Is A DNR now.    All the records are reviewed and case discussed with Care Management/Social Workerr. Management plans discussed with the patient, family and they are in agreement.  CODE STATUS: DNR  TOTAL TIME SPENT IN TAKING CARE OF THIS PATIENT: 30 minutes.   POSSIBLE D/C unclear, DEPENDING ON CLINICAL CONDITION.   Henreitta Leber M.D on 06/20/2015 at 2:30 PM  Between 7am to 6pm - Pager - [phone]  After 6pm go to [email or web address] - password [email or web address] [facility]  [facility]  Office  [phone]  CC: Primary care physician; Juanell Fairly, MD

## 2015-06-20 NOTE — Progress Notes (Signed)
Tolerating extubation Rattling cough C?O generalized intermittent pain  Filed Vitals:   06/20/15 1500 06/20/15 1600 06/20/15 1652 06/20/15 1700  BP: 104/50 95/42 104/55 104/55  Pulse: 88 80 101 98  Temp: 97.9 F (36.6 C)  99.1 F (37.3 C)   TempSrc:      Resp: 21 15 20 22   Height:      Weight:      SpO2: 99% 98% 96% 98%   Chronically ill appearing and frail NAD HEENT NAD JVP not visualized No wheezes Reg, no M Abd soft No edema  CMP Latest Ref Rng 06/20/2015 06/20/2015 06/19/2015  Glucose 65 - 99 mg/dL 82 110(H) 121(H)  BUN 6 - 20 mg/dL 8 9 9   Creatinine 0.44 - 1.00 mg/dL 0.68 0.73 0.61  Sodium 135 - 145 mmol/L 135 135 134(L)  Potassium 3.5 - 5.1 mmol/L 3.9 4.1 4.2  Chloride 101 - 111 mmol/L 105 104 103  CO2 22 - 32 mmol/L 28 28 29   Calcium 8.9 - 10.3 mg/dL 7.9(L) 7.9(L) 7.8(L)  Total Protein 6.5 - 8.1 g/dL - - -  Total Bilirubin 0.3 - 1.2 mg/dL - - -  Alkaline Phos 38 - 126 U/L - - -  AST 15 - 41 U/L - - -  ALT 14 - 54 U/L - - -    CBC    Component Value Date/Time   WBC 15.1* 06/20/2015 0551   WBC 6.9 11/22/2014 0748   RBC 3.32* 06/20/2015 0551   RBC 4.56 11/22/2014 0748   HGB 7.5* 06/20/2015 0551   HGB 10.3* 11/22/2014 0748   HCT 25.0* 06/20/2015 0551   HCT 32.9* 11/22/2014 0748   PLT 106* 06/20/2015 0551   PLT 189 11/26/2014 0428   MCV 75.1* 06/20/2015 0551   MCV 72* 11/22/2014 0748   MCH 22.5* 06/20/2015 0551   MCH 22.6* 11/22/2014 0748   MCHC 29.9* 06/20/2015 0551   MCHC 31.4* 11/22/2014 0748   RDW 26.8* 06/20/2015 0551   RDW 22.4* 11/22/2014 0748   LYMPHSABS 0.6* 06/13/2015 2057   LYMPHSABS 1.4 11/22/2014 0748   MONOABS 1.2* 06/13/2015 2057   MONOABS 0.8 11/22/2014 0748   EOSABS 0.0 06/13/2015 2057   EOSABS 0.0 11/22/2014 0748   BASOSABS 0.0 06/13/2015 2057   BASOSABS 0.0 11/22/2014 0748   CXR: mild edema pattern  IMPRESSION: Acute on chronic resp failure - approaching baseline COPD without acute bronchospasm Acute MI - med rx Shock,  resolved AKI - discussed with Dr Candiss Norse. CRRT to stop today Probable pneumococcal PNA - has completed 10 days abx Very poor baseline QOL DNR/DNI  PLAN/REC: Cont supplemental O2 as needed Cont nebulized bronchodilators Cont med rx of CAD Monitor BMET intermittently Monitor I/Os Correct electrolytes as indicated Further HD to be considered by Renal Service DVT px: SQ heparin Monitor CBC intermittently Transfuse per usual guidelines DC abx today Will likely be OK for transfer to med-surg bed 11/22  Merton Border, MD PCCM service Mobile [phone] Pager [phone]

## 2015-06-20 NOTE — Care Management Note (Signed)
Case Management Note  Patient Details  Name: Lynn Butler MRN: [id number] Date of Birth: 10/17/1945  Subjective/Objective:   LTACH was addressed with ICU attending. Patient not appropriate at this time. Prognosis poor.                  Action/Plan:   Expected Discharge Date:                  Expected Discharge Plan:  [place]  In-House Referral:     Discharge planning Services  CM Consult  Post Acute Care Choice:    Choice offered to:     DME Arranged:    DME Agency:     HH Arranged:    [place] Agency:     Status of Service:  In process, will continue to follow  Medicare Important Message Given:    Date Medicare IM Given:    Medicare IM give by:    Date Additional Medicare IM Given:    Additional Medicare Important Message give by:     If discussed at [place] of Stay Meetings, dates discussed:    Additional Comments:  Jolly Mango, RN 06/20/2015, 10:55 AM

## 2015-06-20 NOTE — Progress Notes (Signed)
ELECTROLYTE  CONSULT NOTE - follow up  Pharmacy Consult for Electrolyte monitoring and management    Allergies  Allergen Reactions  . Nsaids Nausea And Vomiting and Other (See Comments)    Reaction:  GI distress and burning     Patient Measurements: Height: 5\' 2"  (157.5 cm) Weight: 144 lb 6.4 oz (65.5 kg) IBW/kg (Calculated) : 50.1  Vital Signs: Temp: 97.5 F (36.4 C) (11/21 0500) Temp Source: Other (Comment) (11/21 0000) BP: 119/62 mmHg (11/21 0500) Pulse Rate: 89 (11/21 0400) Intake/Output from previous day: 11/20 0701 - 11/21 0700 In: 3611 [I.V.:2680; NG/GT:531; IV Piggyback:400] Out: 2538 [Urine:150] Intake/Output from this shift: Total I/O In: 1571 [I.V.:1100; NG/GT:171; IV Piggyback:300] Out: 864 [Other:864]  Labs:  Recent Labs  06/18/15 0546 06/18/15 1335 06/19/15 0529  WBC 20.4*  --  20.2*  HGB 6.9* 8.7* 8.2*  HCT 23.4* 28.5* 27.2*  PLT 83*  --  119*     Recent Labs  06/19/15 1740 06/19/15 2053 06/20/15 0043  NA 134* 134* 135  K 4.4 4.2 4.1  CL 104 103 104  CO2 27 29 28   GLUCOSE 168* 121* 110*  BUN 10 9 9   CREATININE 0.69 0.61 0.73  CALCIUM 7.9* 7.8* 7.9*  MG 1.5* 2.1 1.8  PHOS 1.9* 2.0* 2.0*  ALBUMIN 2.2* 2.1* 1.9*   Estimated Creatinine Clearance: 59.8 mL/min (by C-G formula based on Cr of 0.73).    Recent Labs  06/20/15 0013 06/20/15 0037 06/20/15 0414  GLUCAP 63* 64* 76    Medical History: Past Medical History  Diagnosis Date  . COPD (chronic obstructive pulmonary disease) ([place])   . Hypertension   . Ischemic cardiomyopathy     a.   . Coronary artery disease     a. s/p multiple stenting in 2005  . History of ventricular tachycardia   . PVD (peripheral vascular disease) ([place])   . PAD (peripheral artery disease) ([place])   . HLD (hyperlipidemia)   . Chronic back pain   . MI, old   . MRSA (methicillin resistant staph aureus) culture positive     left foot wound  . CHF (congestive heart failure) ([place])   . Pneumonia   .  Asthma   . Cancer [facility])     Assessment: 69 yo patient who remains intubated in ICU. Patient is being restarted on CRRT. Pharmacy consulted for electrolyte monitoring and management.   Phos: 1.6 Mg: 1.7 K: 4.3   Plan:  Will order 22mmol Sodium phosphate and 1Gm magnesium. Renal functional panel ordered every 4 hours. Pharmacy will continue to monitor and replace electrolytes daily.  Nancy Fetter, PharmD Pharmacy Resident   11/20 AM PO4 2.4. PhosNaK x 3 doses ordered. Recheck BMP and phosphate in AM.  11/20 afternoon: K 4.3  Mag 1.5  Phos 1.7.  Patient extubated. RN only able to give 1 NeutaPhos packet. Will order Magnesium 2gm IV x1 and Potassium Phosphate 10 mmol Iv x 1.   Renal labs ordered.   11/21 AM PO4 2.0 NeutraPhos PO x 3 doses ordered. Recheck electrolytes in AM.  Sim Boast, PharmD, BCPS  06/20/2015

## 2015-06-20 NOTE — Progress Notes (Signed)
MEDICATION RELATED CONSULT NOTE - follow up  Pharmacy Consult for Constipation Prevention  Allergies  Allergen Reactions  . Nsaids Nausea And Vomiting and Other (See Comments)    Reaction:  GI distress and burning    Patient Measurements: Height: 5\' 2"  (157.5 cm) Weight: 131 lb 9.8 oz (59.7 kg) IBW/kg (Calculated) : 50.1  Vital Signs: Temp: 98.1 F (36.7 C) (11/21 1200) Temp Source: Other (Comment) (11/21 0723) BP: 115/62 mmHg (11/21 1200) Pulse Rate: 94 (11/21 1200) Intake/Output from previous day: 11/20 0701 - 11/21 0700 In: 3811 [I.V.:2880; NG/GT:531; IV Piggyback:400] Out: 3401 [Urine:150] Intake/Output from this shift: Total I/O In: 617 [P.O.:180; I.V.:437] Out: 371 [Urine:75; Other:293; Stool:3]  Labs:  Recent Labs  06/18/15 0546  06/18/15 1335  06/19/15 0529  06/19/15 2053 06/20/15 0043 06/20/15 0551  WBC 20.4*  --   --   --  20.2*  --   --   --  15.1*  HGB 6.9*  --  8.7*  --  8.2*  --   --   --  7.5*  HCT 23.4*  --  28.5*  --  27.2*  --   --   --  25.0*  PLT 83*  --   --   --  119*  --   --   --  106*  CREATININE 0.84  < > 0.89  < > 0.71  < > 0.61 0.73 0.68  MG 1.8  < > 1.7  < >  --   < > 2.1 1.8 1.8  PHOS  --   < > 1.8*  < > 1.9*  < > 2.0* 2.0* 1.7*  ALBUMIN  --   < > 2.1*  < > 2.1*  < > 2.1* 1.9* 1.9*  < > = values in this interval not displayed. Estimated Creatinine Clearance: 53.2 mL/min (by C-G formula based on Cr of 0.68).   Microbiology: Recent Results (from the past 720 hour(s))  Culture, blood (routine x 2)     Status: None   Collection Time: 05/21/15 10:49 PM  Result Value Ref Range Status   Specimen Description BLOOD RIGHT HAND  Final   Special Requests BOTTLES DRAWN AEROBIC AND ANAEROBIC 4CC  Final   Culture NO GROWTH 5 DAYS  Final   Report Status 05/26/2015 FINAL  Final  Culture, blood (routine x 2)     Status: None   Collection Time: 05/21/15 11:00 PM  Result Value Ref Range Status   Specimen Description BLOOD LEFT ASSIST CONTROL   Final   Special Requests BOTTLES DRAWN AEROBIC AND ANAEROBIC 4CC  Final   Culture NO GROWTH 5 DAYS  Final   Report Status 05/26/2015 FINAL  Final  MRSA PCR Screening     Status: None   Collection Time: 05/22/15  1:46 AM  Result Value Ref Range Status   MRSA by PCR NEGATIVE NEGATIVE Final    Comment:        The GeneXpert MRSA Assay (FDA approved for NASAL specimens only), is one component of a comprehensive MRSA colonization surveillance program. It is not intended to diagnose MRSA infection nor to guide or monitor treatment for MRSA infections.   Urine culture     Status: None   Collection Time: 06/10/15  7:30 PM  Result Value Ref Range Status   Specimen Description URINE, RANDOM  Final   Special Requests NONE  Final   Culture NO GROWTH 2 DAYS  Final   Report Status 06/12/2015 FINAL  Final  Blood Culture (  routine x 2)     Status: None   Collection Time: 06/10/15  8:10 PM  Result Value Ref Range Status   Specimen Description BLOOD RIGHT  Final   Special Requests BOTTLES DRAWN AEROBIC AND ANAEROBIC 5CC  Final   Culture  Setup Time   Final    GRAM POSITIVE COCCI AEROBIC BOTTLE ONLY CRITICAL RESULT CALLED TO, READ BACK BY AND VERIFIED WITH: RN Kathi Simpers 06/14/15 1358    Culture   Final    COAGULASE NEGATIVE STAPHYLOCOCCUS Results consistent with contamination.    Report Status 06/16/2015 FINAL  Final  Blood Culture (routine x 2)     Status: None   Collection Time: 06/10/15  8:20 PM  Result Value Ref Range Status   Specimen Description BLOOD RIGHT  Final   Special Requests BOTTLES DRAWN AEROBIC AND ANAEROBIC 2CC AERO, 4CC  Final   Culture NO GROWTH 5 DAYS  Final   Report Status 06/15/2015 FINAL  Final  MRSA PCR Screening     Status: Abnormal   Collection Time: 06/11/15  1:40 AM  Result Value Ref Range Status   MRSA by PCR POSITIVE (A) NEGATIVE Final    Comment:        The GeneXpert MRSA Assay (FDA approved for NASAL specimens only), is one component of  a comprehensive MRSA colonization surveillance program. It is not intended to diagnose MRSA infection nor to guide or monitor treatment for MRSA infections. CRITICAL RESULT CALLED TO, READ BACK BY AND VERIFIED WITH: [facility] ALLEN AT [id number] 06/11/15.PMH   Culture, sputum-assessment     Status: None   Collection Time: 06/11/15  4:30 AM  Result Value Ref Range Status   Specimen Description TRACHEAL ASPIRATE  Final   Special Requests NONE  Final   Sputum evaluation THIS SPECIMEN IS ACCEPTABLE FOR SPUTUM CULTURE  Final   Report Status 06/11/2015 FINAL  Final  Culture, respiratory (NON-Expectorated)     Status: None   Collection Time: 06/11/15  4:30 AM  Result Value Ref Range Status   Specimen Description TRACHEAL ASPIRATE  Final   Special Requests NONE Reflexed from [id number]  Final   Gram Stain   Final    GOOD SPECIMEN - 80-90% WBCS MODERATE WBC SEEN MODERATE GRAM POSITIVE COCCI RARE GRAM NEGATIVE RODS    Culture HEAVY GROWTH STREPTOCOCCUS PNEUMONIAE  Final   Report Status 06/13/2015 FINAL  Final   Organism ID, Bacteria STREPTOCOCCUS PNEUMONIAE  Final      Susceptibility   Streptococcus pneumoniae - MIC*    ERYTHROMYCIN <=0.12 SENSITIVE Sensitive     VANCOMYCIN 0.5 SENSITIVE Sensitive     TRIMETH/SULFA <=10 SENSITIVE Sensitive     CLINDAMYCIN <=0.25 SENSITIVE Sensitive     LEVOFLOXACIN Value in next row Sensitive      SENSITIVE1    LINEZOLID Value in next row Sensitive      SENSITIVE<=2    * HEAVY GROWTH STREPTOCOCCUS PNEUMONIAE  C difficile quick scan w PCR reflex     Status: None   Collection Time: 06/20/15  4:30 AM  Result Value Ref Range Status   C Diff antigen NEGATIVE NEGATIVE Final   C Diff toxin NEGATIVE NEGATIVE Final   C Diff interpretation Negative for C. difficile  Final    Medical History: Past Medical History  Diagnosis Date  . COPD (chronic obstructive pulmonary disease) ([place])   . Hypertension   . Ischemic cardiomyopathy     a.   . Coronary artery  disease  a. s/p multiple stenting in 2005  . History of ventricular tachycardia   . PVD (peripheral vascular disease) ([place])   . PAD (peripheral artery disease) ([place])   . HLD (hyperlipidemia)   . Chronic back pain   . MI, old   . MRSA (methicillin resistant staph aureus) culture positive     left foot wound  . CHF (congestive heart failure) ([place])   . Pneumonia   . Asthma   . Cancer [facility])     Assessment: 69 yo female requiring constipation prophylaxis. Pharmacy consulted for management and monitoring.   Plan:  Patient on senna/docusate daily prn. Last BM today. Will continue to follow.   Ulice Dash, PharmD Clinical Pharmacist 06/20/2015 12:46 PM

## 2015-06-21 LAB — BASIC METABOLIC PANEL
Anion gap: 3 — ABNORMAL LOW (ref 5–15)
BUN: 18 mg/dL (ref 6–20)
CHLORIDE: 105 mmol/L (ref 101–111)
CO2: 29 mmol/L (ref 22–32)
CREATININE: 1.73 mg/dL — AB (ref 0.44–1.00)
Calcium: 8 mg/dL — ABNORMAL LOW (ref 8.9–10.3)
GFR calc non Af Amer: 29 mL/min — ABNORMAL LOW (ref >60)
GFR, EST AFRICAN AMERICAN: 34 mL/min — AB (ref >60)
Glucose, Bld: 81 mg/dL (ref 65–99)
POTASSIUM: 4.2 mmol/L (ref 3.5–5.1)
SODIUM: 137 mmol/L (ref 135–145)

## 2015-06-21 LAB — CBC
HEMATOCRIT: 25.8 % — AB (ref 35.0–47.0)
HEMOGLOBIN: 7.7 g/dL — AB (ref 12.0–16.0)
MCH: 22.9 pg — ABNORMAL LOW (ref 26.0–34.0)
MCHC: 29.8 g/dL — ABNORMAL LOW (ref 32.0–36.0)
MCV: 76.8 fL — AB (ref 80.0–100.0)
Platelets: 131 10*3/uL — ABNORMAL LOW (ref 150–440)
RBC: 3.36 MIL/uL — AB (ref 3.80–5.20)
RDW: 27.4 % — AB (ref 11.5–14.5)
WBC: 16.2 10*3/uL — AB (ref 3.6–11.0)

## 2015-06-21 LAB — GLUCOSE, CAPILLARY
GLUCOSE-CAPILLARY: 117 mg/dL — AB (ref 65–99)
GLUCOSE-CAPILLARY: 128 mg/dL — AB (ref 65–99)
GLUCOSE-CAPILLARY: 154 mg/dL — AB (ref 65–99)
GLUCOSE-CAPILLARY: 111 mg/dL — AB (ref 65–99)
Glucose-Capillary: 42 mg/dL — CL (ref 65–99)

## 2015-06-21 LAB — PHOSPHORUS: PHOSPHORUS: 2.8 mg/dL (ref 2.5–4.6)

## 2015-06-21 MED ORDER — DEXTROSE 50 % IV SOLN: 50.0000 mL | INTRAVENOUS | Status: AC

## 2015-06-21 MED ORDER — DEXTROSE 50 % IV SOLN
INTRAVENOUS | Status: AC
  Administered 2015-06-21: 50 mL
  Filled 2015-06-21: qty 50

## 2015-06-21 NOTE — Progress Notes (Signed)
[facility]  ROUNDING NOTE   Subjective:  CRRT was stopped 11/21 UOP is low but urine is clear. 3200 cc removed with CRRT  Nursing staff report several loose stools Able to drink ensure  Objective:  Vital signs in last 24 hours:  Temp:  [95.5 F (35.3 C)-99.1 F (37.3 C)] 96.3 F (35.7 C) (11/22 0800) Pulse Rate:  [32-105] 105 (11/22 0800) Resp:  [14-22] 17 (11/22 0800) BP: (95-141)/(42-121) 118/58 mmHg (11/22 0800) SpO2:  [86 %-100 %] 98 % (11/22 0800)  Weight change:  Filed Weights   06/18/15 0400 06/19/15 0414 06/20/15 0700  Weight: 62.3 kg (137 lb 5.6 oz) 65.5 kg (144 lb 6.4 oz) 59.7 kg (131 lb 9.8 oz)    Intake/Output: I/O last 3 completed shifts: In: 3128 [P.O.:680; I.V.:1927; NG/GT:171; IV Piggyback:350] Out: 2474 [Urine:445; Other:2020; Stool:9]   Intake/Output this shift:  Total I/O In: 10 [I.V.:10] Out: -   Physical Exam: General: Critically ill appearing  Head: Healing wound on forehead  E, ENT: Moist oral mucus membranes  Neck: Supple,   Lungs:  Bilateral rhonchi,   Heart: S1S2 no rubs  Abdomen:  Soft, nontender, BS present   Extremities:  trace peripheral edema.  Neurologic: Awake , able to communicate  Skin: Large forehead scar from prior skin cancer   Left femoral non-tunneled catheter    Basic Metabolic Panel:  Recent Labs Lab 06/19/15 1343 06/19/15 1740 06/19/15 2053 06/20/15 0043 06/20/15 0551 06/21/15 0453  NA 135 134* 134* 135 135 137  K 4.3 4.4 4.2 4.1 3.9 4.2  CL 103 104 103 104 105 105  CO2 28 27 29 28 28 29   GLUCOSE 157* 168* 121* 110* 82 81  BUN 10 10 9 9 8 18   CREATININE 0.68 0.69 0.61 0.73 0.68 1.73*  CALCIUM 8.0* 7.9* 7.8* 7.9* 7.9* 8.0*  MG 1.5* 1.5* 2.1 1.8 1.8  --   PHOS 1.7* 1.9* 2.0* 2.0* 1.7* 2.8    Liver Function Tests:  Recent Labs Lab 06/16/15 0520  06/19/15 1343 06/19/15 1740 06/19/15 2053 06/20/15 0043 06/20/15 0551  AST 78*  --   --   --   --   --   --   ALT 407*  --   --   --    --   --   --   ALKPHOS 147*  --   --   --   --   --   --   BILITOT 0.6  --   --   --   --   --   --   PROT 4.8*  --   --   --   --   --   --   ALBUMIN 2.1*  2.1*  < > 2.1* 2.2* 2.1* 1.9* 1.9*  < > = values in this interval not displayed. No results for input(s): LIPASE, AMYLASE in the last 168 hours. No results for input(s): AMMONIA in the last 168 hours.  CBC:  Recent Labs Lab 06/17/15 0339 06/18/15 0546 06/18/15 1335 06/19/15 0529 06/20/15 0551 06/21/15 0453  WBC 19.0* 20.4*  --  20.2* 15.1* 16.2*  HGB 6.8* 6.9* 8.7* 8.2* 7.5* 7.7*  HCT 23.7* 23.4* 28.5* 27.2* 25.0* 25.8*  MCV 68.7* 69.9*  --  74.7* 75.1* 76.8*  PLT 88* 83*  --  119* 106* 131*    Cardiac Enzymes: No results for input(s): CKTOTAL, CKMB, CKMBINDEX, TROPONINI in the last 168 hours.  BNP: Invalid input(s): POCBNP  CBG:  Recent Labs Lab  06/20/15 1137 06/20/15 1628 06/20/15 2001 06/21/15 0738 06/21/15 0827  GLUCAP 120* 61* 107* 42* 117*    Microbiology: Results for orders placed or performed during the hospital encounter of 06/10/15  Urine culture     Status: None   Collection Time: 06/10/15  7:30 PM  Result Value Ref Range Status   Specimen Description URINE, RANDOM  Final   Special Requests NONE  Final   Culture NO GROWTH 2 DAYS  Final   Report Status 06/12/2015 FINAL  Final  Blood Culture (routine x 2)     Status: None   Collection Time: 06/10/15  8:10 PM  Result Value Ref Range Status   Specimen Description BLOOD RIGHT  Final   Special Requests BOTTLES DRAWN AEROBIC AND ANAEROBIC 5CC  Final   Culture  Setup Time   Final    GRAM POSITIVE COCCI AEROBIC BOTTLE ONLY CRITICAL RESULT CALLED TO, READ BACK BY AND VERIFIED WITH: RN Kathi Simpers 06/14/15 1358    Culture   Final    COAGULASE NEGATIVE STAPHYLOCOCCUS Results consistent with contamination.    Report Status 06/16/2015 FINAL  Final  Blood Culture (routine x 2)     Status: None   Collection Time: 06/10/15  8:20 PM   Result Value Ref Range Status   Specimen Description BLOOD RIGHT  Final   Special Requests BOTTLES DRAWN AEROBIC AND ANAEROBIC 2CC AERO, 4CC  Final   Culture NO GROWTH 5 DAYS  Final   Report Status 06/15/2015 FINAL  Final  MRSA PCR Screening     Status: Abnormal   Collection Time: 06/11/15  1:40 AM  Result Value Ref Range Status   MRSA by PCR POSITIVE (A) NEGATIVE Final    Comment:        The GeneXpert MRSA Assay (FDA approved for NASAL specimens only), is one component of a comprehensive MRSA colonization surveillance program. It is not intended to diagnose MRSA infection nor to guide or monitor treatment for MRSA infections. CRITICAL RESULT CALLED TO, READ BACK BY AND VERIFIED WITH: [facility] ALLEN AT [id number] 06/11/15.PMH   Culture, sputum-assessment     Status: None   Collection Time: 06/11/15  4:30 AM  Result Value Ref Range Status   Specimen Description TRACHEAL ASPIRATE  Final   Special Requests NONE  Final   Sputum evaluation THIS SPECIMEN IS ACCEPTABLE FOR SPUTUM CULTURE  Final   Report Status 06/11/2015 FINAL  Final  Culture, respiratory (NON-Expectorated)     Status: None   Collection Time: 06/11/15  4:30 AM  Result Value Ref Range Status   Specimen Description TRACHEAL ASPIRATE  Final   Special Requests NONE Reflexed from [id number]  Final   Gram Stain   Final    GOOD SPECIMEN - 80-90% WBCS MODERATE WBC SEEN MODERATE GRAM POSITIVE COCCI RARE GRAM NEGATIVE RODS    Culture HEAVY GROWTH STREPTOCOCCUS PNEUMONIAE  Final   Report Status 06/13/2015 FINAL  Final   Organism ID, Bacteria STREPTOCOCCUS PNEUMONIAE  Final      Susceptibility   Streptococcus pneumoniae - MIC*    ERYTHROMYCIN <=0.12 SENSITIVE Sensitive     VANCOMYCIN 0.5 SENSITIVE Sensitive     TRIMETH/SULFA <=10 SENSITIVE Sensitive     CLINDAMYCIN <=0.25 SENSITIVE Sensitive     LEVOFLOXACIN Value in next row Sensitive      SENSITIVE1    LINEZOLID Value in next row Sensitive      SENSITIVE<=2    * HEAVY  GROWTH STREPTOCOCCUS PNEUMONIAE  C difficile quick scan w PCR  reflex     Status: None   Collection Time: 06/20/15  4:30 AM  Result Value Ref Range Status   C Diff antigen NEGATIVE NEGATIVE Final   C Diff toxin NEGATIVE NEGATIVE Final   C Diff interpretation Negative for C. difficile  Final    Coagulation Studies: No results for input(s): LABPROT, INR in the last 72 hours.  Urinalysis: No results for input(s): COLORURINE, LABSPEC, PHURINE, GLUCOSEU, HGBUR, BILIRUBINUR, KETONESUR, PROTEINUR, UROBILINOGEN, NITRITE, LEUKOCYTESUR in the last 72 hours.  Invalid input(s): APPERANCEUR    Imaging: Dg Chest 1 View  06/20/2015  CLINICAL DATA:  Shortness of Breath EXAM: CHEST  1 VIEW COMPARISON:  None. FINDINGS: Cardiac shadow is enlarged. A defibrillator is again seen and stable. A left jugular central line is been advanced and now lies at the confluence of the innominate veins. Endotracheal tube and nasogastric catheter have been removed in the interval. Mild left retrocardiac atelectasis is seen. Small left-sided pleural effusion is noted. No other focal abnormality is seen. IMPRESSION: Tubes and lines as described. Left lower lobe atelectasis with associated small effusion. Electronically Signed   By: Inez Catalina M.D.   On: 06/20/2015 07:11     Medications:   . dextrose 10 mL/hr at 06/21/15 0700   . aspirin  81 mg Oral Daily  . budesonide  0.25 mg Nebulization BID  . feeding supplement (ENSURE ENLIVE)  237 mL Oral TID  . ferrous sulfate  220 mg Oral BID WC  . ipratropium-albuterol  3 mL Nebulization Q6H  . oxyCODONE  80 mg Oral Q12H  . predniSONE  10 mg Oral Q breakfast   acetaminophen **OR** [DISCONTINUED] acetaminophen, diazepam, diphenoxylate-atropine, guaiFENesin, HYDROmorphone (DILAUDID) injection, HYDROmorphone, ondansetron (ZOFRAN) IV, polyvinyl alcohol, senna-docusate, zolpidem  Assessment/ Plan:  69 y.o. female  with a PMHx of COPD, hypertension, ischemic cardiomyopathy with  multiple coronary artery stents, history of ventricular tachycardia, peripheral vascular disease, hyperlipidemia, chronic back pain, history of congestive heart failure, who was admitted to [facility] on 06/10/2015 for evaluation of respiratory failure.   1. Acute renal failure secondary to acute tubular necrosis. Patient presented with multiorgan failure upon presentation. Baseline creatinine 0.75. -pt remains critically ill with oliguric acute renal failure.   - 3200 cc removed with CRRT -  Monitor labs and UOP  2. Hyperkalemia.   - resolved  3. Acute respiratory failure.  - extubated now   4. Acute myocardial infarction.  Management per pulmonary critical care and hospitalist.   5.  Anemia unspecified: hgb 7.7 post transfusion, continue to monitor CBC.    LOS: 11 Shyonna Carlin 11/22/20169:02 AM

## 2015-06-21 NOTE — Progress Notes (Signed)
[facility] [place] NAME: Lynn Butler    MR#:  [id number]  DATE OF BIRTH:  06/20/1946  SUBJECTIVE:   Extubated 11/21 and now on [place] doing well.  Also taken off CRRT and urine output marginal. Noted to be hypoglycemic and therefore D10 drip started and BS have improved.    REVIEW OF SYSTEMS:  Review of Systems  Constitutional: Negative for fever and chills.  HENT: Negative for congestion and tinnitus.   Eyes: Negative for blurred vision and double vision.  Respiratory: Negative for cough, shortness of breath and wheezing.   Cardiovascular: Negative for chest pain, orthopnea and PND.  Gastrointestinal: Negative for nausea, vomiting, abdominal pain and diarrhea.  Genitourinary: Negative for dysuria and hematuria.  Neurological: Positive for weakness ( generalized). Negative for dizziness, sensory change and focal weakness.  All other systems reviewed and are negative.   DRUG ALLERGIES:   Allergies  Allergen Reactions  . Heparin     Thrombocytopenia. HIT screen +  . Nsaids Nausea And Vomiting and Other (See Comments)    Reaction:  GI distress and burning     VITALS:  Blood pressure 106/53, pulse 81, temperature 98.6 F (37 C), temperature source Core (Comment), resp. rate 16, height 5\' 2"  (1.575 m), weight 59.7 kg (131 lb 9.8 oz), SpO2 100 %.  PHYSICAL EXAMINATION:  Physical Exam  GENERAL:  69 y.o.-year-old patient lying in the bed awake and following commands.  EYES: Pupils equal, round, reactive to light. No scleral icterus. HEENT: Head atraumatic, normocephalic.  Forehead mass has been removed, s/p plastic surgery and skin graft in place.  No oropharyngeal erythema  NECK:  Supple, no jugular venous distention. No thyroid enlargement, no tenderness.  LUNGS: Poor resp. effort, no wheezing, rales, rhonchi. No use of accessory muscles of respiration. CARDIOVASCULAR: S1, S2 normal. No murmurs, rubs, or gallops.  ABDOMEN:  Soft, nontender, nondistended. Bowel sounds present. No organomegaly or mass.  EXTREMITIES: No pedal edema, cyanosis, or clubbing. Dialysis catheter in left groin NEUROLOGIC: NO focal motor or sensory defecits b/l.  Globally weak and bedbound.  PSYCHIATRIC: AAO X 3.  Good affect.  SKIN: forehead skin graft in place.  + left foot pressure ulcers.     LABORATORY PANEL:   CBC  Recent Labs Lab 06/21/15 0453  WBC 16.2*  HGB 7.7*  HCT 25.8*  PLT 131*   ------------------------------------------------------------------------------------------------------------------  Chemistries   Recent Labs Lab 06/16/15 0520  06/20/15 0551 06/21/15 0453  NA 140  < > 135 137  K 4.4  < > 3.9 4.2  CL 106  < > 105 105  CO2 29  < > 28 29  GLUCOSE 130*  < > 82 81  BUN 18  < > 8 18  CREATININE 0.94  < > 0.68 1.73*  CALCIUM 8.4*  < > 7.9* 8.0*  MG 1.6*  < > 1.8  --   AST 78*  --   --   --   ALT 407*  --   --   --   ALKPHOS 147*  --   --   --   BILITOT 0.6  --   --   --   < > = values in this interval not displayed. ------------------------------------------------------------------------------------------------------------------  Cardiac Enzymes No results for input(s): TROPONINI in the last 168 hours. ------------------------------------------------------------------------------------------------------------------  RADIOLOGY:  Dg Chest 1 View  06/20/2015  CLINICAL DATA:  Shortness of Breath EXAM: CHEST  1 VIEW COMPARISON:  None. FINDINGS:  Cardiac shadow is enlarged. A defibrillator is again seen and stable. A left jugular central line is been advanced and now lies at the confluence of the innominate veins. Endotracheal tube and nasogastric catheter have been removed in the interval. Mild left retrocardiac atelectasis is seen. Small left-sided pleural effusion is noted. No other focal abnormality is seen. IMPRESSION: Tubes and lines as described. Left lower lobe atelectasis with associated small  effusion. Electronically Signed   By: Inez Catalina M.D.   On: 06/20/2015 07:11    ASSESSMENT AND PLAN:   69y/oF with chronic COPD on home o2, multiple abdominal surgeries for obstruction, CAD, HTN, CHF, PAD admitted for septic shock  1. Septic shock- due to pneumonia - blood cultures negative, sputum cultures with streptococcus pneumoniae.  BC + for coag (-) staph which is likely a contaminant.  - finished treatment w/ IV Rocephin.  - hemodynamically stable and afebrile and off pressors.   2. Hypoglycemia - due to poor PO intake.  - cont. D10 gtt and follow BS.   3. Acute respiratory failure- due to pneumonia, COPD Exacerbation - extubated 11/21 and doing well on [place] O2.  -Continue Pulmicort nebs, DuoNeb's, off IV steroids now.    4. ARF- with oliguria, cr normalized, but urine output still very poor.  - off CRRT since yesterday And will follow urine output (445 cc/24 hrs) - cont. Care as per Nephrology.   4. Elevated troponins- NSTEMI, known severe 2 vessel disease in past with no chance for revascularization - appreciate cards consult - no active symptoms for now  5. Acute on chronic anemia- s/p IV Iron and Transfusion over the weekend and Hg. Stable and will monitor.  - no acute bleeding.   6. Chronic pain syndrome- continue OxyContin, oral Dilaudid, Valium.  Appreciate Palliative Care help.  Pt. Is A DNR now.  Patient contemplating discharged to skilled nursing facility with hospice services versus hospice home. Palliative care to further address this.  All the records are reviewed and case discussed with Care Management/Social Workerr. Management plans discussed with the patient, family and they are in agreement.  CODE STATUS: DNR  TOTAL TIME SPENT IN TAKING CARE OF THIS PATIENT: 30 minutes.   POSSIBLE D/C unclear, DEPENDING ON CLINICAL CONDITION.   Henreitta Leber M.D on 06/21/2015 at 4:16 PM  Between 7am to 6pm - Pager - [phone]  After 6pm go to  [email or web address] - password [email or web address]  [facility]  Office  [phone]  CC: Primary care physician; Juanell Fairly, MD

## 2015-06-21 NOTE — Progress Notes (Signed)
MEDICATION RELATED CONSULT NOTE - follow up  Pharmacy Consult for Constipation Prevention  Allergies  Allergen Reactions  . Nsaids Nausea And Vomiting and Other (See Comments)    Reaction:  GI distress and burning    Patient Measurements: Height: 5\' 2"  (157.5 cm) Weight: 131 lb 9.8 oz (59.7 kg) IBW/kg (Calculated) : 50.1  Vital Signs: Temp: 98.2 F (36.8 C) (11/22 1200) Temp Source: Core (Comment) (11/22 1200) BP: 116/56 mmHg (11/22 1200) Pulse Rate: 87 (11/22 1200) Intake/Output from previous day: 11/21 0701 - 11/22 0700 In: [id number] [P.O.:680; I.V.:627; IV Piggyback:50] Out: 744 [Urine:445; Stool:6] Intake/Output from this shift: Total I/O In: 114.7 [I.V.:114.7] Out: -   Labs:  Recent Labs  06/19/15 0529  06/19/15 2053 06/20/15 0043 06/20/15 0551 06/21/15 0453  WBC 20.2*  --   --   --  15.1* 16.2*  HGB 8.2*  --   --   --  7.5* 7.7*  HCT 27.2*  --   --   --  25.0* 25.8*  PLT 119*  --   --   --  106* 131*  CREATININE 0.71  < > 0.61 0.73 0.68 1.73*  MG  --   < > 2.1 1.8 1.8  --   PHOS 1.9*  < > 2.0* 2.0* 1.7* 2.8  ALBUMIN 2.1*  < > 2.1* 1.9* 1.9*  --   < > = values in this interval not displayed. Estimated Creatinine Clearance: 24.6 mL/min (by C-G formula based on Cr of 1.73).   Microbiology: Recent Results (from the past 720 hour(s))  Urine culture     Status: None   Collection Time: 06/10/15  7:30 PM  Result Value Ref Range Status   Specimen Description URINE, RANDOM  Final   Special Requests NONE  Final   Culture NO GROWTH 2 DAYS  Final   Report Status 06/12/2015 FINAL  Final  Blood Culture (routine x 2)     Status: None   Collection Time: 06/10/15  8:10 PM  Result Value Ref Range Status   Specimen Description BLOOD RIGHT  Final   Special Requests BOTTLES DRAWN AEROBIC AND ANAEROBIC 5CC  Final   Culture  Setup Time   Final    GRAM POSITIVE COCCI AEROBIC BOTTLE ONLY CRITICAL RESULT CALLED TO, READ BACK BY AND VERIFIED WITH: RN Kathi Simpers  06/14/15 1358    Culture   Final    COAGULASE NEGATIVE STAPHYLOCOCCUS Results consistent with contamination.    Report Status 06/16/2015 FINAL  Final  Blood Culture (routine x 2)     Status: None   Collection Time: 06/10/15  8:20 PM  Result Value Ref Range Status   Specimen Description BLOOD RIGHT  Final   Special Requests BOTTLES DRAWN AEROBIC AND ANAEROBIC 2CC AERO, 4CC  Final   Culture NO GROWTH 5 DAYS  Final   Report Status 06/15/2015 FINAL  Final  MRSA PCR Screening     Status: Abnormal   Collection Time: 06/11/15  1:40 AM  Result Value Ref Range Status   MRSA by PCR POSITIVE (A) NEGATIVE Final    Comment:        The GeneXpert MRSA Assay (FDA approved for NASAL specimens only), is one component of a comprehensive MRSA colonization surveillance program. It is not intended to diagnose MRSA infection nor to guide or monitor treatment for MRSA infections. CRITICAL RESULT CALLED TO, READ BACK BY AND VERIFIED WITH: [facility] ALLEN AT [id number] 06/11/15.PMH   Culture, sputum-assessment     Status: None  Collection Time: 06/11/15  4:30 AM  Result Value Ref Range Status   Specimen Description TRACHEAL ASPIRATE  Final   Special Requests NONE  Final   Sputum evaluation THIS SPECIMEN IS ACCEPTABLE FOR SPUTUM CULTURE  Final   Report Status 06/11/2015 FINAL  Final  Culture, respiratory (NON-Expectorated)     Status: None   Collection Time: 06/11/15  4:30 AM  Result Value Ref Range Status   Specimen Description TRACHEAL ASPIRATE  Final   Special Requests NONE Reflexed from [id number]  Final   Gram Stain   Final    GOOD SPECIMEN - 80-90% WBCS MODERATE WBC SEEN MODERATE GRAM POSITIVE COCCI RARE GRAM NEGATIVE RODS    Culture HEAVY GROWTH STREPTOCOCCUS PNEUMONIAE  Final   Report Status 06/13/2015 FINAL  Final   Organism ID, Bacteria STREPTOCOCCUS PNEUMONIAE  Final      Susceptibility   Streptococcus pneumoniae - MIC*    ERYTHROMYCIN <=0.12 SENSITIVE Sensitive     VANCOMYCIN 0.5  SENSITIVE Sensitive     TRIMETH/SULFA <=10 SENSITIVE Sensitive     CLINDAMYCIN <=0.25 SENSITIVE Sensitive     LEVOFLOXACIN Value in next row Sensitive      SENSITIVE1    LINEZOLID Value in next row Sensitive      SENSITIVE<=2    * HEAVY GROWTH STREPTOCOCCUS PNEUMONIAE  C difficile quick scan w PCR reflex     Status: None   Collection Time: 06/20/15  4:30 AM  Result Value Ref Range Status   C Diff antigen NEGATIVE NEGATIVE Final   C Diff toxin NEGATIVE NEGATIVE Final   C Diff interpretation Negative for C. difficile  Final    Medical History: Past Medical History  Diagnosis Date  . COPD (chronic obstructive pulmonary disease) ([place])   . Hypertension   . Ischemic cardiomyopathy     a.   . Coronary artery disease     a. s/p multiple stenting in 2005  . History of ventricular tachycardia   . PVD (peripheral vascular disease) ([place])   . PAD (peripheral artery disease) ([place])   . HLD (hyperlipidemia)   . Chronic back pain   . MI, old   . MRSA (methicillin resistant staph aureus) culture positive     left foot wound  . CHF (congestive heart failure) ([place])   . Pneumonia   . Asthma   . Cancer [facility])     Assessment: 69 yo female requiring constipation prophylaxis. Pharmacy consulted for management and monitoring.   Plan:  Patient on senna/docusate daily prn. BM recorded today. Pharmacy will continue to follow.   Ulice Dash, PharmD Clinical Pharmacist 06/21/2015 12:39 PM

## 2015-06-21 NOTE — Progress Notes (Signed)
ELECTROLYTE  CONSULT NOTE - follow up  Pharmacy Consult for Electrolyte monitoring and management    Allergies  Allergen Reactions  . Nsaids Nausea And Vomiting and Other (See Comments)    Reaction:  GI distress and burning     Patient Measurements: Height: 5\' 2"  (157.5 cm) Weight: 131 lb 9.8 oz (59.7 kg) IBW/kg (Calculated) : 50.1  Vital Signs: Temp: 97.5 F (36.4 C) (11/22 0500) Temp Source: Other (Comment) (11/21 2000) BP: 123/63 mmHg (11/22 0500) Pulse Rate: 88 (11/22 0400) Intake/Output from previous day: 11/21 0701 - 11/22 0700 In: 1337 [P.O.:680; I.V.:607; IV Piggyback:50] Out: 443 [Urine:145; Stool:5] Intake/Output from this shift: Total I/O In: 110 [I.V.:110] Out: 2 [Stool:2]  Labs:  Recent Labs  06/19/15 0529 06/20/15 0551 06/21/15 0453  WBC 20.2* 15.1* 16.2*  HGB 8.2* 7.5* 7.7*  HCT 27.2* 25.0* 25.8*  PLT 119* 106* 131*     Recent Labs  06/19/15 2053 06/20/15 0043 06/20/15 0551 06/21/15 0453  NA 134* 135 135 137  K 4.2 4.1 3.9 4.2  CL 103 104 105 105  CO2 29 28 28 29   GLUCOSE 121* 110* 82 81  BUN 9 9 8 18   CREATININE 0.61 0.73 0.68 1.73*  CALCIUM 7.8* 7.9* 7.9* 8.0*  MG 2.1 1.8 1.8  --   PHOS 2.0* 2.0* 1.7* 2.8  ALBUMIN 2.1* 1.9* 1.9*  --    Estimated Creatinine Clearance: 24.6 mL/min (by C-G formula based on Cr of 1.73).    Recent Labs  06/20/15 1137 06/20/15 1628 06/20/15 2001  GLUCAP 120* 61* 107*    Medical History: Past Medical History  Diagnosis Date  . COPD (chronic obstructive pulmonary disease) ([place])   . Hypertension   . Ischemic cardiomyopathy     a.   . Coronary artery disease     a. s/p multiple stenting in 2005  . History of ventricular tachycardia   . PVD (peripheral vascular disease) ([place])   . PAD (peripheral artery disease) ([place])   . HLD (hyperlipidemia)   . Chronic back pain   . MI, old   . MRSA (methicillin resistant staph aureus) culture positive     left foot wound  . CHF (congestive heart  failure) ([place])   . Pneumonia   . Asthma   . Cancer [facility])     Assessment: 69 yo patient who remains intubated in ICU. Patient is being restarted on CRRT. Pharmacy consulted for electrolyte monitoring and management.   Phos: 1.6 Mg: 1.7 K: 4.3   Plan:  Will order 55mmol Sodium phosphate and 1Gm magnesium. Renal functional panel ordered every 4 hours. Pharmacy will continue to monitor and replace electrolytes daily.  Nancy Fetter, PharmD Pharmacy Resident   11/20 AM PO4 2.4. PhosNaK x 3 doses ordered. Recheck BMP and phosphate in AM.  11/20 afternoon: K 4.3  Mag 1.5  Phos 1.7.  Patient extubated. RN only able to give 1 NeutaPhos packet. Will order Magnesium 2gm IV x1 and Potassium Phosphate 10 mmol Iv x 1.   Renal labs ordered.   11/21 AM PO4 2.0 NeutraPhos PO x 3 doses ordered. Recheck electrolytes in AM.  1122 0453 potassium 4.2, phosphorus 2.8, no further supplementation ordered, will follow up with AM labs.   Amaka Gluth A. [place], [place].D., BCPS Clinical Pharmacist  06/21/2015

## 2015-06-22 LAB — PHOSPHORUS: Phosphorus: 3 mg/dL (ref 2.5–4.6)

## 2015-06-22 LAB — BASIC METABOLIC PANEL
ANION GAP: 4 — AB (ref 5–15)
BUN: 31 mg/dL — ABNORMAL HIGH (ref 6–20)
CHLORIDE: 103 mmol/L (ref 101–111)
CO2: 27 mmol/L (ref 22–32)
Calcium: 7.8 mg/dL — ABNORMAL LOW (ref 8.9–10.3)
Creatinine, Ser: 2.77 mg/dL — ABNORMAL HIGH (ref 0.44–1.00)
GFR calc Af Amer: 19 mL/min — ABNORMAL LOW (ref >60)
GFR, EST NON AFRICAN AMERICAN: 17 mL/min — AB (ref >60)
GLUCOSE: 100 mg/dL — AB (ref 65–99)
POTASSIUM: 4.1 mmol/L (ref 3.5–5.1)
Sodium: 134 mmol/L — ABNORMAL LOW (ref 135–145)

## 2015-06-22 LAB — GLUCOSE, CAPILLARY
GLUCOSE-CAPILLARY: 95 mg/dL (ref 65–99)
GLUCOSE-CAPILLARY: 109 mg/dL — AB (ref 65–99)
Glucose-Capillary: 113 mg/dL — ABNORMAL HIGH (ref 65–99)
Glucose-Capillary: 119 mg/dL — ABNORMAL HIGH (ref 65–99)
Glucose-Capillary: 158 mg/dL — ABNORMAL HIGH (ref 65–99)

## 2015-06-22 LAB — MAGNESIUM: Magnesium: 1.7 mg/dL (ref 1.7–2.4)

## 2015-06-22 MED ORDER — FERROUS SULFATE 220 (44 FE) MG/5ML PO ELIX
220.0000 mg | ORAL_SOLUTION | Freq: Three times a day (TID) | ORAL | Status: DC
  Administered 2015-06-22 – 2015-06-24 (×4): 220 mg via ORAL
  Filled 2015-06-22 (×8): qty 5

## 2015-06-22 NOTE — Care Management Note (Signed)
Case Management Note  Patient Details  Name: Lynn Butler MRN: [id number] Date of Birth: 1945/12/27  Subjective/Objective: Will need further evaluation for SNF.  Patient lives at home and there is some concern that patient is not safe to live at home alone at this time.                                  Action/Plan: Will request consult for CSW and PT evaluation  Expected Discharge Date:                  Expected Discharge Plan:  [place]  In-House Referral:  Clinical Social Work  Discharge planning Services  CM Consult  Post Acute Care Choice:    Choice offered to:     DME Arranged:    DME Agency:     HH Arranged:    [place] Agency:     Status of Service:  In process, will continue to follow  Medicare Important Message Given:    Date Medicare IM Given:    Medicare IM give by:    Date Additional Medicare IM Given:    Additional Medicare Important Message give by:     If discussed at [place] of Stay Meetings, dates discussed:    Additional Comments:  Jolly Mango, RN 06/22/2015, 8:49 AM

## 2015-06-22 NOTE — Care Management Note (Signed)
Case Management Note  Patient Details  Name: Joliet Dangel MRN: [id number] Date of Birth: 19-Jul-1946  Subjective/Objective:    Spoke with Dr. Earleen Newport. Order received for CSW consult and PT evaluation.                Action/Plan:   Expected Discharge Date:                  Expected Discharge Plan:  Skilled Nursing Facility  In-House Referral:  Clinical Social Work  Discharge planning Services  CM Consult  Post Acute Care Choice:    Choice offered to:     DME Arranged:    DME Agency:     HH Arranged:    [place] Agency:     Status of Service:  In process, will continue to follow  Medicare Important Message Given:    Date Medicare IM Given:    Medicare IM give by:    Date Additional Medicare IM Given:    Additional Medicare Important Message give by:     If discussed at [place] of Stay Meetings, dates discussed:    Additional Comments:  Jolly Mango, RN 06/22/2015, 9:48 AM

## 2015-06-22 NOTE — Plan of Care (Signed)
Problem: Phase III Progression Outcomes Goal: Transfer/discharge plan in place Outcome: Progressing Transferred from CCU.

## 2015-06-22 NOTE — NC FL2 (Signed)
[place] LEVEL OF CARE SCREENING TOOL     IDENTIFICATION  Patient Name: Lynn Butler Birthdate: Jul 30, 1946 Sex: female Admission Date (Current Location): 06/10/2015  [place] and [place] Number:     Facility and Address:  [facility], [address], [place], [place] [address]      Provider Number: [id number]  Attending Physician Name and Address:  Loletha Grayer, MD  Relative Name and Phone Number:       Current Level of Care: Hospital Recommended Level of Care: [place] Prior Approval Number:    Date Approved/Denied:   PASRR Number: [id number]  Discharge Plan: Home    Current Diagnoses: Patient Active Problem List   Diagnosis Date Noted  . Pressure ulcer 06/11/2015  . Multi-organ system dysfunction 06/10/2015  . Septic shock ([place]) 06/10/2015  . CAP (community acquired pneumonia) 06/10/2015  . Chest pain, central 05/21/2015  . Right lower lobe pneumonia 04/07/2015  . Acute on chronic respiratory failure with hypoxia ([place]) 04/07/2015  . Acute renal failure ([place]) 04/07/2015  . Leukocytosis 04/07/2015  . Metabolic encephalopathy [id number]  . HCAP (healthcare-associated pneumonia) 02/06/2015  . Sepsis ([place]) 02/06/2015  . Chronic respiratory failure with hypoxia ([place]) 02/01/2015  . Syncope and collapse 02/01/2015  . Chronic combined systolic and diastolic CHF (congestive heart failure) ([place]) 02/01/2015  . Myocardial infarction in recovery phase ([place]) 01/20/2015  . HTN (hypertension) 01/20/2015  . Acute MI ([place]) 01/13/2015  . Protein-calorie malnutrition, severe ([place]) 07/22/2014  . COPD mixed type ([place]) 07/21/2014  . CAD (coronary artery disease) 07/21/2014  . CHF (congestive heart failure) ([place]) 07/21/2014  . PVD (peripheral vascular disease) ([place]) 07/21/2014  . Leg ulcer ([place]) 07/21/2014  . Skin lesion of face 07/21/2014  . Adult failure to thrive 07/21/2014  . Claudication of both lower extremities  ([place]) 07/21/2014  . Smoker 07/21/2014    Orientation ACTIVITIES/SOCIAL BLADDER RESPIRATION    Self, Time, Situation    Continent O2 (As needed)  BEHAVIORAL SYMPTOMS/MOOD NEUROLOGICAL BOWEL NUTRITION STATUS      Incontinent Diet (Regular Diet, Thin Liquids)  PHYSICIAN VISITS COMMUNICATION OF NEEDS Height & Weight Skin  30 days   5\' 2"  (157.5 cm) 131 lbs. Skin abrasions          AMBULATORY STATUS RESPIRATION    Assist extensive O2 (As needed)      Personal Care Assistance Level of Assistance  Bathing, Feeding, Dressing Bathing Assistance: Limited assistance Feeding assistance: Independent Dressing Assistance: Limited assistance      Functional Limitations Info                SPECIAL CARE FACTORS FREQUENCY                      Additional Factors Info  Code Status, Allergies Code Status Info: Heparin, Nsaids Allergies Info: DNR           Current Medications (06/22/2015): Current Facility-Administered Medications  Medication Dose Route Frequency Provider Last Rate Last Dose  . acetaminophen (TYLENOL) tablet 650 mg  650 mg Oral Q6H PRN Lance Coon, MD      . aspirin chewable tablet 81 mg  81 mg Oral Daily Wilhelmina Mcardle, MD   81 mg at 06/22/15 1046  . budesonide (PULMICORT) nebulizer solution 0.25 mg  0.25 mg Nebulization BID Wilhelmina Mcardle, MD   0.25 mg at 06/22/15 [id number]  . diazepam (VALIUM) tablet 5 mg  5 mg Oral Q12H PRN Henreitta Leber, MD  5 mg at 06/22/15 0022  . diphenoxylate-atropine (LOMOTIL) 2.5-0.025 MG per tablet 2 tablet  2 tablet Oral QID PRN Wilhelmina Mcardle, MD   2 tablet at 06/21/15 [phone]  . feeding supplement (ENSURE ENLIVE) (ENSURE ENLIVE) liquid 237 mL  237 mL Oral TID Henreitta Leber, MD   237 mL at 06/22/15 1300  . ferrous sulfate 220 (44 FE) MG/5ML solution 220 mg  220 mg Oral TID WC Loletha Grayer, MD      . guaiFENesin [facility]) 12 hr tablet 600 mg  600 mg Oral BID PRN Wilhelmina Mcardle, MD   600 mg at 06/21/15 1004  . HYDROmorphone  (DILAUDID) injection 2 mg  2 mg Intravenous Q2H PRN Laverle Hobby, MD   2 mg at 06/22/15 1248  . HYDROmorphone (DILAUDID) tablet 4 mg  4 mg Oral Q6H PRN Gladstone Lighter, MD   4 mg at 06/20/15 1700  . ipratropium-albuterol (DUONEB) 0.5-2.5 (3) MG/3ML nebulizer solution 3 mL  3 mL Nebulization Q6H Wilhelmina Mcardle, MD   3 mL at 06/22/15 1516  . ondansetron (ZOFRAN) injection 4 mg  4 mg Intravenous Q6H PRN Henreitta Leber, MD   4 mg at 06/22/15 0618  . oxyCODONE (OXYCONTIN) 12 hr tablet 80 mg  80 mg Oral Q12H Henreitta Leber, MD   80 mg at 06/22/15 1047  . polyvinyl alcohol (LIQUIFILM TEARS) 1.4 % ophthalmic solution 1 drop  1 drop Both Eyes Q4H PRN Lenis Noon, [facility] -       . predniSONE (DELTASONE) tablet 10 mg  10 mg Oral Q breakfast Wilhelmina Mcardle, MD   10 mg at 06/22/15 [id number]  . senna-docusate (Senokot-S) tablet 1 tablet  1 tablet Oral Daily PRN Henreitta Leber, MD      . zolpidem (AMBIEN) tablet 5 mg  5 mg Oral QHS PRN Lytle Butte, MD   5 mg at 06/21/15 2113   Do not use this list as official medication orders. Please verify with discharge summary.  Discharge Medications:   Medication List    ASK your doctor about these medications        albuterol 108 (90 BASE) MCG/ACT inhaler  Commonly known as:  PROVENTIL HFA;VENTOLIN HFA  Inhale 2 puffs into the lungs every 4 (four) hours as needed for wheezing or shortness of breath.     atorvastatin 40 MG tablet  Commonly known as:  LIPITOR  Take 1 tablet (40 mg total) by mouth daily at 6 PM.     benzonatate 200 MG capsule  Commonly known as:  TESSALON  Take 1 capsule (200 mg total) by mouth 3 (three) times daily as needed for cough.     carvedilol 3.125 MG tablet  Commonly known as:  COREG  Take 1 tablet (3.125 mg total) by mouth 2 (two) times daily with a meal.     collagenase ointment  Commonly known as:  SANTYL  Apply 1 application topically daily.     diazepam 10 MG tablet  Commonly known as:  VALIUM  Take 1 tablet (10 mg  total) by mouth every 12 (twelve) hours as needed for anxiety.     feeding supplement (ENSURE ENLIVE) Liqd  Take 237 mLs by mouth 3 (three) times daily with meals.     guaiFENesin 600 MG 12 hr tablet  Commonly known as:  MUCINEX  Take 1 tablet (600 mg total) by mouth 2 (two) times daily.     HYDROmorphone 4 MG tablet  Commonly known as:  DILAUDID  Take 1 tablet (4 mg total) by mouth every 6 (six) hours as needed for severe pain.     ipratropium-albuterol 0.5-2.5 (3) MG/3ML Soln  Commonly known as:  DUONEB  Take 3 mLs by nebulization every 6 (six) hours as needed.     isosorbide mononitrate 30 MG 24 hr tablet  Commonly known as:  IMDUR  Take 1 tablet (30 mg total) by mouth daily.     loperamide 2 MG tablet  Commonly known as:  IMODIUM A-D  Take 1 tablet (2 mg total) by mouth 4 (four) times daily as needed for diarrhea or loose stools.     mometasone-formoterol 100-5 MCG/ACT Aero  Commonly known as:  DULERA  Inhale 2 puffs into the lungs 2 (two) times daily.     oxyCODONE 80 mg 12 hr tablet  Commonly known as:  OXYCONTIN  Take 1 tablet (80 mg total) by mouth every 12 (twelve) hours.     promethazine 25 MG tablet  Commonly known as:  PHENERGAN  Take 25 mg by mouth daily as needed for nausea or vomiting.     tiotropium 18 MCG inhalation capsule  Commonly known as:  SPIRIVA  Place 1 capsule (18 mcg total) into inhaler and inhale daily.     zolpidem 10 MG tablet  Commonly known as:  AMBIEN  Take 5 mg by mouth at bedtime as needed for sleep.        Relevant Imaging Results:  Relevant Lab Results:  Recent Labs    Additional Information    [name], LCSW

## 2015-06-22 NOTE — Progress Notes (Signed)
Patient ID: Lynn Butler, female   DOB: 05-04-1946, 69 y.o.   MRN: [id number] [facility] PROGRESS NOTE  PCP: Juanell Fairly, MD  HPI/Subjective: Patient with some shortness of breath. Chronic pain in the chest and abdomen and legs.  Objective: Filed Vitals:   06/22/15 1502 06/22/15 1539  BP: 111/51   Pulse: 81 90  Temp: 98 F (36.7 C)   Resp: 20     Filed Weights   06/18/15 0400 06/19/15 0414 06/20/15 0700  Weight: 62.3 kg (137 lb 5.6 oz) 65.5 kg (144 lb 6.4 oz) 59.7 kg (131 lb 9.8 oz)    ROS: Review of Systems  Constitutional: Negative for fever and chills.  Eyes: Negative for blurred vision.  Respiratory: Positive for cough and shortness of breath.   Cardiovascular: Positive for chest pain.  Gastrointestinal: Positive for abdominal pain and diarrhea. Negative for nausea, vomiting and constipation.  Genitourinary: Negative for dysuria.  Musculoskeletal: Positive for back pain. Negative for joint pain.  Neurological: Negative for dizziness and headaches.   Exam: Physical Exam  Constitutional: She is oriented to person, place, and time.  HENT:  Nose: No mucosal edema.  Mouth/Throat: No oropharyngeal exudate or posterior oropharyngeal edema.  Eyes: Conjunctivae, EOM and lids are normal. Pupils are equal, round, and reactive to light.  Neck: No JVD present. Carotid bruit is not present. No edema present. No thyroid mass and no thyromegaly present.  Cardiovascular: S1 normal and S2 normal.  Exam reveals no gallop.   No murmur heard. Pulses:      Dorsalis pedis pulses are 0 on the right side, and 0 on the left side.  Respiratory: No respiratory distress. She has decreased breath sounds in the right lower field and the left lower field. She has wheezes in the right lower field and the left lower field. She has no rhonchi. She has no rales.  GI: Soft. Bowel sounds are normal. There is no tenderness.  Musculoskeletal:       Right ankle: She exhibits no swelling.        Left ankle: She exhibits no swelling.  Lymphadenopathy:    She has no cervical adenopathy.  Neurological: She is alert and oriented to person, place, and time. No cranial nerve deficit.  Skin: Skin is warm. Nails show no clubbing.  Chronic left lower chin the ulceration. Skin graft on  forehead with scab  Psychiatric: She has a normal mood and affect.    Data Reviewed: Basic Metabolic Panel:  Recent Labs Lab 06/19/15 1740 06/19/15 2053 06/20/15 0043 06/20/15 0551 06/21/15 0453 06/22/15 0618  NA 134* 134* 135 135 137 134*  K 4.4 4.2 4.1 3.9 4.2 4.1  CL 104 103 104 105 105 103  CO2 27 29 28 28 29 27   GLUCOSE 168* 121* 110* 82 81 100*  BUN 10 9 9 8 18  31*  CREATININE 0.69 0.61 0.73 0.68 1.73* 2.77*  CALCIUM 7.9* 7.8* 7.9* 7.9* 8.0* 7.8*  MG 1.5* 2.1 1.8 1.8  --  1.7  PHOS 1.9* 2.0* 2.0* 1.7* 2.8 3.0   Liver Function Tests:  Recent Labs Lab 06/16/15 0520  06/19/15 1343 06/19/15 1740 06/19/15 2053 06/20/15 0043 06/20/15 0551  AST 78*  --   --   --   --   --   --   ALT 407*  --   --   --   --   --   --   ALKPHOS 147*  --   --   --   --   --   --  BILITOT 0.6  --   --   --   --   --   --   PROT 4.8*  --   --   --   --   --   --   ALBUMIN 2.1*  2.1*  < > 2.1* 2.2* 2.1* 1.9* 1.9*  < > = values in this interval not displayed. CBC:  Recent Labs Lab 06/17/15 0339 06/18/15 0546 06/18/15 1335 06/19/15 0529 06/20/15 0551 06/21/15 0453  WBC 19.0* 20.4*  --  20.2* 15.1* 16.2*  HGB 6.8* 6.9* 8.7* 8.2* 7.5* 7.7*  HCT 23.7* 23.4* 28.5* 27.2* 25.0* 25.8*  MCV 68.7* 69.9*  --  74.7* 75.1* 76.8*  PLT 88* 83*  --  119* 106* 131*     Recent Results (from the past 240 hour(s))  C difficile quick scan w PCR reflex     Status: None   Collection Time: 06/20/15  4:30 AM  Result Value Ref Range Status   C Diff antigen NEGATIVE NEGATIVE Final   C Diff toxin NEGATIVE NEGATIVE Final   C Diff interpretation Negative for C. difficile  Final    Scheduled Meds: . aspirin   81 mg Oral Daily  . budesonide  0.25 mg Nebulization BID  . feeding supplement (ENSURE ENLIVE)  237 mL Oral TID  . ferrous sulfate  220 mg Oral TID WC  . ipratropium-albuterol  3 mL Nebulization Q6H  . oxyCODONE  80 mg Oral Q12H  . predniSONE  10 mg Oral Q breakfast    Assessment/Plan:  1. Septic shock secondary to pneumonia. Sputum culture grew out Streptococcus pneumoniae. Patient finished treatment with IV Rocephin. Stable for transfer out of the ICU today 2. Hypoglycemia- patient is now off D10 drip 3. Acute respiratory failure, COPD exacerbation and pneumonia. Patient extubated 11/21. Patient on chronic oxygen at home. Continue taper to prednisone. Continue nebulizer treatments. 4. Acute renal failure with oligoria. Patient was on CRRT and nephrology is monitoring off dialysis at this point. 5. Acute myocardial infarction NSTEMI- aspirin and medical management. No beta blocker with bronchospasm and end-stage COPD 6. Acute on chronic anemia- status post transfusion and IV iron. Procrit with dialysis. Oral iron ordered 7. Chronic pain syndrome on oxycodone and dye loaded and Valium 8. Weakness- patient will likely need skilled nursing facility. Physical therapy evaluation. 9. Diarrhea- patient has rectal tube in. Stool for C. difficile is negative. Lomotil when necessary.  Code Status:     Code Status Orders        Start     Ordered   06/19/15 1140  Do not attempt resuscitation (DNR)   Continuous    Question Answer Comment  In the event of cardiac or respiratory ARREST Do not call a "code blue"   In the event of cardiac or respiratory ARREST Do not perform Intubation, CPR, defibrillation or ACLS   In the event of cardiac or respiratory ARREST Use medication by any route, position, wound care, and other measures to relive pain and suffering. May use oxygen, suction and manual treatment of airway obstruction as needed for comfort.      06/19/15 1140      Disposition Plan:  Likely will need skilled nursing facility  Consultants:  Critical care specialist  Nephrology  Time spent: 25 minutes  Loletha Grayer  [facility]

## 2015-06-22 NOTE — Progress Notes (Signed)
Dr. Earleen Newport called. He is ok with transferring patient to floor.

## 2015-06-22 NOTE — Progress Notes (Signed)
[facility]  ROUNDING NOTE   Subjective:  CRRT was stopped 11/21, 3200 cc removed with CRRT UOP is low but urine is clear. 575 cc last 24 hours Trying to drink ensure  Objective:  Vital signs in last 24 hours:  Temp:  [98.1 F (36.7 C)-99.1 F (37.3 C)] 98.4 F (36.9 C) (11/23 0800) Pulse Rate:  [75-96] 75 (11/23 0800) Resp:  [15-27] 15 (11/23 0800) BP: (99-134)/(48-74) 105/54 mmHg (11/23 0800) SpO2:  [98 %-100 %] 99 % (11/23 0828)  Weight change:  Filed Weights   06/18/15 0400 06/19/15 0414 06/20/15 0700  Weight: 62.3 kg (137 lb 5.6 oz) 65.5 kg (144 lb 6.4 oz) 59.7 kg (131 lb 9.8 oz)    Intake/Output: I/O last 3 completed shifts: In: 1855.7 [P.O.:711; I.V.:1144.7] Out: 878 [Urine:875; Stool:3]   Intake/Output this shift:  Total I/O In: 170 [P.O.:120; I.V.:50] Out: 40 [Urine:40]  Physical Exam: General: Chronically ill appearing  Head: Healing wound on forehead  E, ENT: Moist oral mucus membranes  Neck: Supple,   Lungs:  Bilateral rhonchi,   Heart: S1S2 no rubs  Abdomen:  Soft, nontender, BS present   Extremities:  trace peripheral edema.  Neurologic: Awake , able to communicate  Skin: Large forehead scar from prior skin cancer   Left femoral non-tunneled catheter, foley, rectal tube    Basic Metabolic Panel:  Recent Labs Lab 06/19/15 1740 06/19/15 2053 06/20/15 0043 06/20/15 0551 06/21/15 0453 06/22/15 0618  NA 134* 134* 135 135 137 134*  K 4.4 4.2 4.1 3.9 4.2 4.1  CL 104 103 104 105 105 103  CO2 27 29 28 28 29 27   GLUCOSE 168* 121* 110* 82 81 100*  BUN 10 9 9 8 18  31*  CREATININE 0.69 0.61 0.73 0.68 1.73* 2.77*  CALCIUM 7.9* 7.8* 7.9* 7.9* 8.0* 7.8*  MG 1.5* 2.1 1.8 1.8  --  1.7  PHOS 1.9* 2.0* 2.0* 1.7* 2.8 3.0    Liver Function Tests:  Recent Labs Lab 06/16/15 0520  06/19/15 1343 06/19/15 1740 06/19/15 2053 06/20/15 0043 06/20/15 0551  AST 78*  --   --   --   --   --   --   ALT 407*  --   --   --   --   --   --    ALKPHOS 147*  --   --   --   --   --   --   BILITOT 0.6  --   --   --   --   --   --   PROT 4.8*  --   --   --   --   --   --   ALBUMIN 2.1*  2.1*  < > 2.1* 2.2* 2.1* 1.9* 1.9*  < > = values in this interval not displayed. No results for input(s): LIPASE, AMYLASE in the last 168 hours. No results for input(s): AMMONIA in the last 168 hours.  CBC:  Recent Labs Lab 06/17/15 0339 06/18/15 0546 06/18/15 1335 06/19/15 0529 06/20/15 0551 06/21/15 0453  WBC 19.0* 20.4*  --  20.2* 15.1* 16.2*  HGB 6.8* 6.9* 8.7* 8.2* 7.5* 7.7*  HCT 23.7* 23.4* 28.5* 27.2* 25.0* 25.8*  MCV 68.7* 69.9*  --  74.7* 75.1* 76.8*  PLT 88* 83*  --  119* 106* 131*    Cardiac Enzymes: No results for input(s): CKTOTAL, CKMB, CKMBINDEX, TROPONINI in the last 168 hours.  BNP: Invalid input(s): POCBNP  CBG:  Recent Labs Lab 06/21/15 0827  06/21/15 1124 06/21/15 1642 06/21/15 2214 06/22/15 0716  GLUCAP 117* 128* 154* 111* 95    Microbiology: Results for orders placed or performed during the hospital encounter of 06/10/15  Urine culture     Status: None   Collection Time: 06/10/15  7:30 PM  Result Value Ref Range Status   Specimen Description URINE, RANDOM  Final   Special Requests NONE  Final   Culture NO GROWTH 2 DAYS  Final   Report Status 06/12/2015 FINAL  Final  Blood Culture (routine x 2)     Status: None   Collection Time: 06/10/15  8:10 PM  Result Value Ref Range Status   Specimen Description BLOOD RIGHT  Final   Special Requests BOTTLES DRAWN AEROBIC AND ANAEROBIC 5CC  Final   Culture  Setup Time   Final    GRAM POSITIVE COCCI AEROBIC BOTTLE ONLY CRITICAL RESULT CALLED TO, READ BACK BY AND VERIFIED WITH: RN Kathi Simpers 06/14/15 1358    Culture   Final    COAGULASE NEGATIVE STAPHYLOCOCCUS Results consistent with contamination.    Report Status 06/16/2015 FINAL  Final  Blood Culture (routine x 2)     Status: None   Collection Time: 06/10/15  8:20 PM  Result Value Ref  Range Status   Specimen Description BLOOD RIGHT  Final   Special Requests BOTTLES DRAWN AEROBIC AND ANAEROBIC 2CC AERO, 4CC  Final   Culture NO GROWTH 5 DAYS  Final   Report Status 06/15/2015 FINAL  Final  MRSA PCR Screening     Status: Abnormal   Collection Time: 06/11/15  1:40 AM  Result Value Ref Range Status   MRSA by PCR POSITIVE (A) NEGATIVE Final    Comment:        The GeneXpert MRSA Assay (FDA approved for NASAL specimens only), is one component of a comprehensive MRSA colonization surveillance program. It is not intended to diagnose MRSA infection nor to guide or monitor treatment for MRSA infections. CRITICAL RESULT CALLED TO, READ BACK BY AND VERIFIED WITH: [facility] ALLEN AT [id number] 06/11/15.PMH   Culture, sputum-assessment     Status: None   Collection Time: 06/11/15  4:30 AM  Result Value Ref Range Status   Specimen Description TRACHEAL ASPIRATE  Final   Special Requests NONE  Final   Sputum evaluation THIS SPECIMEN IS ACCEPTABLE FOR SPUTUM CULTURE  Final   Report Status 06/11/2015 FINAL  Final  Culture, respiratory (NON-Expectorated)     Status: None   Collection Time: 06/11/15  4:30 AM  Result Value Ref Range Status   Specimen Description TRACHEAL ASPIRATE  Final   Special Requests NONE Reflexed from [id number]  Final   Gram Stain   Final    GOOD SPECIMEN - 80-90% WBCS MODERATE WBC SEEN MODERATE GRAM POSITIVE COCCI RARE GRAM NEGATIVE RODS    Culture HEAVY GROWTH STREPTOCOCCUS PNEUMONIAE  Final   Report Status 06/13/2015 FINAL  Final   Organism ID, Bacteria STREPTOCOCCUS PNEUMONIAE  Final      Susceptibility   Streptococcus pneumoniae - MIC*    ERYTHROMYCIN <=0.12 SENSITIVE Sensitive     VANCOMYCIN 0.5 SENSITIVE Sensitive     TRIMETH/SULFA <=10 SENSITIVE Sensitive     CLINDAMYCIN <=0.25 SENSITIVE Sensitive     LEVOFLOXACIN Value in next row Sensitive      SENSITIVE1    LINEZOLID Value in next row Sensitive      SENSITIVE<=2    * HEAVY GROWTH STREPTOCOCCUS  PNEUMONIAE  C difficile quick scan w PCR reflex  Status: None   Collection Time: 06/20/15  4:30 AM  Result Value Ref Range Status   C Diff antigen NEGATIVE NEGATIVE Final   C Diff toxin NEGATIVE NEGATIVE Final   C Diff interpretation Negative for C. difficile  Final    Coagulation Studies: No results for input(s): LABPROT, INR in the last 72 hours.  Urinalysis: No results for input(s): COLORURINE, LABSPEC, PHURINE, GLUCOSEU, HGBUR, BILIRUBINUR, KETONESUR, PROTEINUR, UROBILINOGEN, NITRITE, LEUKOCYTESUR in the last 72 hours.  Invalid input(s): APPERANCEUR    Imaging: No results found.   Medications:   . dextrose 50 mL/hr at 06/22/15 0800   . aspirin  81 mg Oral Daily  . budesonide  0.25 mg Nebulization BID  . feeding supplement (ENSURE ENLIVE)  237 mL Oral TID  . ferrous sulfate  220 mg Oral BID WC  . ipratropium-albuterol  3 mL Nebulization Q6H  . oxyCODONE  80 mg Oral Q12H  . predniSONE  10 mg Oral Q breakfast   acetaminophen **OR** [DISCONTINUED] acetaminophen, diazepam, diphenoxylate-atropine, guaiFENesin, HYDROmorphone (DILAUDID) injection, HYDROmorphone, ondansetron (ZOFRAN) IV, polyvinyl alcohol, senna-docusate, zolpidem  Assessment/ Plan:  69 y.o. female  with a PMHx of COPD, hypertension, ischemic cardiomyopathy with multiple coronary artery stents, history of ventricular tachycardia, peripheral vascular disease, hyperlipidemia, chronic back pain, history of congestive heart failure, who was admitted to [facility] on 06/10/2015 for evaluation of respiratory failure.   1. Acute renal failure secondary to acute tubular necrosis. Patient presented with multiorgan failure upon presentation. Baseline creatinine 0.75. - pt remains critically ill with UOP improving slowly.  575 cc - S Cr / BUN higher  - Electrolytes and Volume status are acceptable No acute indication for Dialysis at present  -  Monitor labs and UOP  2. Hyperkalemia.   - resolved  3. Acute respiratory  failure.  - extubated now   4. Acute myocardial infarction.  Management per pulmonary critical care and hospitalist.   5.  Anemia unspecified: hgb 7.7 post transfusion, continue to monitor CBC.    LOS: 12 Saturnino Liew 11/23/20169:43 AM

## 2015-06-22 NOTE — Progress Notes (Signed)
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 Pt is clinically improving. Will hold off on anticoagulation for now.     Intervention Category Intermediate Interventions: Other:  Brannan Cassedy 06/22/2015, 2:20 AM

## 2015-06-22 NOTE — Progress Notes (Signed)
   06/22/15 1000  Clinical Encounter Type  Visited With Patient;Health care provider  Visit Type Follow-up  Consult/Referral To Chaplain  Spiritual Encounters  Spiritual Needs Emotional  Chaplain rounded in the unit and offered a compassionate presence and check in to see how patient was progressing. She was grateful and appreciative of how she felt. Chaplain Latiqua Daloia A. Londin Antone Ext. [phone]

## 2015-06-22 NOTE — Clinical Social Work Note (Signed)
Clinical Education officer, museum received consult for New SNF. PT transferred from ICU today. PT eval is pending. CSW attempted to assess pt, however she was sleeping soundly. Full assessment to follow. CSW updated RN. FL2 completed in anticiapation for SNF placement. CSW will continue to follow.   [name], MSW, LCSW Clinical Social Worker  [phone]

## 2015-06-22 NOTE — Evaluation (Signed)
LATE ENTRY note signed 11/28 (completed 11/23). Celene Pippins H. 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  Hospital course additionally significant for intubatin 11/11-11/20 and CRRT 11/14-11/21 with L temp fem cath still in place.  Clinical Impression  Upon evaluation, patient alert and oriented to basic information, but generally confused to more complex information.  Patient responsive to therapist and follows simple commands.  Bilat UE/LE strength generally weak and deconditioned (L LE not tested during session); maintains L lateral lean and position of L torticollis despite intermittent repositioning.  Fatigues very quickly with simple supine therex (though vitals stable and WFL); additional OOB activity contraindicated at this time due to L temp fem cath.  Will continue to follow and progress mobility as medically appropriate. Would benefit from skilled PT to address above deficits and promote optimal return to PLOF; recommend transition to STR upon discharge from acute hospitalization.     Follow Up Recommendations SNF    Equipment Recommendations       Recommendations for Other Services       Precautions / Restrictions Precautions Precautions: Fall Precaution Comments: contact isolation, sacral skin breakdown, L temp fem cath Restrictions Weight Bearing Restrictions: No      Mobility  Bed Mobility               General bed mobility comments: deferred secondary to placement of L temp fem cath  Transfers                 General transfer comment: deferred secondary to  placement of L temp fem cath  Ambulation/Gait             General Gait Details: deferred secondary to placement of L temp fem cath  Stairs            Wheelchair Mobility    Modified Rankin (Stroke Patients Only)       Balance                                             Pertinent Vitals/Pain Pain Assessment: No/denies pain    Home Living Family/patient expects to be discharged to:: Private residence Living Arrangements: Children Available Help at Discharge: Family Type of Home: House Home Access: Stairs to enter Entrance Stairs-Rails: Can reach both Entrance Stairs-Number of Steps: 4 Home Layout: One level        Prior Function Level of Independence: Independent with assistive device(s)         Comments: Ambulatory for household distances with intermittent use of RW, "don't use it like I probably should"     Hand Dominance        Extremity/Trunk Assessment   Upper Extremity Assessment: Generalized weakness (grossly 3-/5 throughout)           Lower Extremity Assessment:  (R LE grossly at least 3/5, L LE not tested secondary to temp fem cath)      Cervical / Trunk Assessment:  (maintains L lateral lean; head position with noted torticollis towarsd L)  Communication   Communication:  No difficulties  Cognition Arousal/Alertness: Awake/alert Behavior During Therapy: WFL for tasks assessed/performed Overall Cognitive Status:  (oriented to self, location and general situation; follows commands and interacts appropriately)                      General Comments      Exercises Other Exercises Other Exercises: Bilat UE therex, 1x10: shoulder flex/ext, elbow flex/ext, forearm pronation/supination Other Exercises: R LE supine therex, 1x10: ankle pumps, heel slides and hip abduct/adduct      Assessment/Plan    PT Assessment Patient needs continued PT services  PT Diagnosis Difficulty walking;Generalized weakness    PT Problem List Decreased range of motion;Decreased strength;Decreased activity tolerance;Decreased balance;Decreased mobility;Decreased knowledge of use of DME;Decreased knowledge of precautions;Cardiopulmonary status limiting activity;Decreased safety awareness;Decreased skin integrity  PT Treatment Interventions DME instruction;Gait training;Stair training;Functional mobility training;Therapeutic activities;Therapeutic exercise;Balance training;Cognitive remediation;Patient/family education   PT Goals (Current goals can be found in the Care Plan section) Acute Rehab PT Goals Patient Stated Goal: "to get out of here" PT Goal Formulation: With patient Time For Goal Achievement: 07/06/15 Potential to Achieve Goals: Fair Additional Goals Additional Goal #1: Assess and establish goals for unsupported sitting and OOB as appropriate.    Frequency Min 2X/week   Barriers to discharge Decreased caregiver support;Inaccessible home environment      Co-evaluation               End of Session   Activity Tolerance: Patient tolerated treatment well Patient left: in bed;with call bell/phone within reach           Time: [id number] PT Time Calculation (min) (ACUTE ONLY): 14 min   Charges:   PT Evaluation $Initial PT Evaluation Tier I: 1 Procedure PT Treatments $Therapeutic Exercise: 8-22 mins   PT G Codes:       Amadi Yoshino H. Owens Shark, PT, DPT, NCS 06/22/2015, 3:45 PM [phone]

## 2015-06-23 LAB — BASIC METABOLIC PANEL
ANION GAP: 6 (ref 5–15)
BUN: 40 mg/dL — ABNORMAL HIGH (ref 6–20)
CHLORIDE: 102 mmol/L (ref 101–111)
CO2: 27 mmol/L (ref 22–32)
Calcium: 8 mg/dL — ABNORMAL LOW (ref 8.9–10.3)
Creatinine, Ser: 3.45 mg/dL — ABNORMAL HIGH (ref 0.44–1.00)
GFR calc Af Amer: 15 mL/min — ABNORMAL LOW (ref >60)
GFR, EST NON AFRICAN AMERICAN: 13 mL/min — AB (ref >60)
GLUCOSE: 81 mg/dL (ref 65–99)
POTASSIUM: 4.3 mmol/L (ref 3.5–5.1)
Sodium: 135 mmol/L (ref 135–145)

## 2015-06-23 LAB — GLUCOSE, CAPILLARY
GLUCOSE-CAPILLARY: 116 mg/dL — AB (ref 65–99)
GLUCOSE-CAPILLARY: 134 mg/dL — AB (ref 65–99)
Glucose-Capillary: 104 mg/dL — ABNORMAL HIGH (ref 65–99)

## 2015-06-23 LAB — HEMOGLOBIN: Hemoglobin: 7.1 g/dL — ABNORMAL LOW (ref 12.0–16.0)

## 2015-06-23 MED ORDER — HYDROMORPHONE HCL 2 MG PO TABS
6.0000 mg | ORAL_TABLET | ORAL | Status: DC | PRN
Start: 2015-06-23 — End: 2015-07-05
  Administered 2015-06-23 – 2015-07-05 (×34): 6 mg via ORAL
  Filled 2015-06-23 (×34): qty 3

## 2015-06-23 MED ORDER — IPRATROPIUM-ALBUTEROL 0.5-2.5 (3) MG/3ML IN SOLN
3.0000 mL | Freq: Three times a day (TID) | RESPIRATORY_TRACT | Status: DC
  Administered 2015-06-24 – 2015-07-05 (×24): 3 mL via RESPIRATORY_TRACT
  Filled 2015-06-23 (×32): qty 3

## 2015-06-23 NOTE — Progress Notes (Addendum)
Patient ID: Lynn Butler, female   DOB: 06-09-1946, 69 y.o.   MRN: [id number] [facility] PROGRESS NOTE  PCP: Juanell Fairly, MD  HPI/Subjective: Patient had abdominal pain last night. She is asking me to increase her oral pain medication up to 6 mg of Dilantin loaded every 4 hours. Still with some chest pain and shortness of breath.  Objective: Filed Vitals:   06/23/15 0510 06/23/15 1316  BP: 127/60 104/57  Pulse: 79 78  Temp: 97.8 F (36.6 C) 98.1 F (36.7 C)  Resp: 16 20    Filed Weights   06/18/15 0400 06/19/15 0414 06/20/15 0700  Weight: 62.3 kg (137 lb 5.6 oz) 65.5 kg (144 lb 6.4 oz) 59.7 kg (131 lb 9.8 oz)    ROS: Review of Systems  Constitutional: Negative for fever and chills.  Eyes: Negative for blurred vision.  Respiratory: Positive for cough and shortness of breath.   Cardiovascular: Positive for chest pain.  Gastrointestinal: Positive for abdominal pain and diarrhea. Negative for nausea, vomiting and constipation.  Genitourinary: Negative for dysuria.  Musculoskeletal: Positive for back pain. Negative for joint pain.  Neurological: Negative for dizziness and headaches.   Exam: Physical Exam  Constitutional: She is oriented to person, place, and time.  HENT:  Nose: No mucosal edema.  Mouth/Throat: No oropharyngeal exudate or posterior oropharyngeal edema.  Eyes: Conjunctivae, EOM and lids are normal. Pupils are equal, round, and reactive to light.  Neck: No JVD present. Carotid bruit is not present. No edema present. No thyroid mass and no thyromegaly present.  Cardiovascular: S1 normal and S2 normal.  Exam reveals no gallop.   No murmur heard. Pulses:      Dorsalis pedis pulses are 0 on the right side, and 0 on the left side.  Respiratory: No respiratory distress. She has decreased breath sounds in the right lower field and the left lower field. She has wheezes in the right lower field and the left lower field. She has no rhonchi. She has no  rales.  GI: Soft. Bowel sounds are normal. There is no tenderness.  Musculoskeletal:       Right ankle: She exhibits no swelling.       Left ankle: She exhibits no swelling.  Lymphadenopathy:    She has no cervical adenopathy.  Neurological: She is alert and oriented to person, place, and time. No cranial nerve deficit.  Skin: Skin is warm. Nails show no clubbing.  Chronic left lower shin the ulceration. Skin graft on  forehead with scab  Psychiatric: She has a normal mood and affect.    Data Reviewed: Basic Metabolic Panel:  Recent Labs Lab 06/19/15 1740 06/19/15 2053 06/20/15 0043 06/20/15 0551 06/21/15 0453 06/22/15 0618 06/23/15 0557  NA 134* 134* 135 135 137 134* 135  K 4.4 4.2 4.1 3.9 4.2 4.1 4.3  CL 104 103 104 105 105 103 102  CO2 27 29 28 28 29 27 27   GLUCOSE 168* 121* 110* 82 81 100* 81  BUN 10 9 9 8 18  31* 40*  CREATININE 0.69 0.61 0.73 0.68 1.73* 2.77* 3.45*  CALCIUM 7.9* 7.8* 7.9* 7.9* 8.0* 7.8* 8.0*  MG 1.5* 2.1 1.8 1.8  --  1.7  --   PHOS 1.9* 2.0* 2.0* 1.7* 2.8 3.0  --    Liver Function Tests:  Recent Labs Lab 06/19/15 1343 06/19/15 1740 06/19/15 2053 06/20/15 0043 06/20/15 0551  ALBUMIN 2.1* 2.2* 2.1* 1.9* 1.9*   CBC:  Recent Labs Lab 06/17/15 0339 06/18/15 0546  06/18/15 1335 06/19/15 0529 06/20/15 0551 06/21/15 0453 06/23/15 0557  WBC 19.0* 20.4*  --  20.2* 15.1* 16.2*  --   HGB 6.8* 6.9* 8.7* 8.2* 7.5* 7.7* 7.1*  HCT 23.7* 23.4* 28.5* 27.2* 25.0* 25.8*  --   MCV 68.7* 69.9*  --  74.7* 75.1* 76.8*  --   PLT 88* 83*  --  119* 106* 131*  --      Recent Results (from the past 240 hour(s))  C difficile quick scan w PCR reflex     Status: None   Collection Time: 06/20/15  4:30 AM  Result Value Ref Range Status   C Diff antigen NEGATIVE NEGATIVE Final   C Diff toxin NEGATIVE NEGATIVE Final   C Diff interpretation Negative for C. difficile  Final    Scheduled Meds: . aspirin  81 mg Oral Daily  . budesonide  0.25 mg Nebulization  BID  . feeding supplement (ENSURE ENLIVE)  237 mL Oral TID  . ferrous sulfate  220 mg Oral TID WC  . ipratropium-albuterol  3 mL Nebulization Q6H  . oxyCODONE  80 mg Oral Q12H  . predniSONE  10 mg Oral Q breakfast    Assessment/Plan:  1. Septic shock secondary to pneumonia. Sputum culture grew out Streptococcus pneumoniae. Patient finished treatment with IV Rocephin. 2. Hypoglycemia- resolved 3. Acute respiratory failure, COPD exacerbation and pneumonia. Patient extubated 11/21. Patient on chronic oxygen at home. Continue taper to prednisone. Continue nebulizer treatments. 4. Acute renal failure with oligoria. Patient was on CRRT and nephrology is monitoring off dialysis at this point. Creatinine is up to 3.45. Check a BMP in the a.m. 5. Acute myocardial infarction NSTEMI- aspirin and medical management. No beta blocker with bronchospasm and end-stage COPD 6. Acute on chronic anemia- status post transfusion and IV iron. Procrit with dialysis. Oral iron ordered. 7. Chronic pain syndrome on oxycodone and dilaudid and Valium 8. Weakness- patient will likely need skilled nursing facility. Physical therapy evaluation. 9. Diarrhea- nurse intake out rectal tube today  Code Status:     Code Status Orders        Start     Ordered   06/19/15 1140  Do not attempt resuscitation (DNR)   Continuous    Question Answer Comment  In the event of cardiac or respiratory ARREST Do not call a "code blue"   In the event of cardiac or respiratory ARREST Do not perform Intubation, CPR, defibrillation or ACLS   In the event of cardiac or respiratory ARREST Use medication by any route, position, wound care, and other measures to relive pain and suffering. May use oxygen, suction and manual treatment of airway obstruction as needed for comfort.      06/19/15 1140      Disposition Plan: Likely will need skilled nursing facility  Consultants:  Critical care specialist  Nephrology  Time spent: 24  minutes  Loletha Grayer  [facility]

## 2015-06-23 NOTE — Plan of Care (Signed)
Problem: Phase III Progression Outcomes Goal: Transfer/discharge plan in place Outcome: Progressing Flexiseal and Foley removed today - causing pt discomfort.  PRN pain medication changed to q4 - given 1x and pt rested comfortably.  Pt's BUN and creatinine are trending up.  Followed by nephrology - sd she may have to have dialysis again.  Hgb dropped from 7.7 to 7.1 today.  Dr informed, but he sd he didn't want to transfuse.  Pt sating in mid 20's on 4L which is what she uses at home.  Pt hasn't been seen by PT yet.  May need to go to SNF.

## 2015-06-23 NOTE — Plan of Care (Signed)
Problem: Phase III Progression Outcomes Goal: Other Phase III Outcomes/Goals Outcome: Progressing Pt has been reporting generalized pain throughout the shift. Pt report she usually uses 4L O2 at home and wishes her oxygen. Pt was on 2L and O2 was increased to 4L. No other signs of distress noted. Will continue to monitor.

## 2015-06-23 NOTE — Progress Notes (Signed)
[facility]  ROUNDING NOTE   Subjective:  Patient doing much better as compared to when I last saw her. Creatinine currently up to 3.45. Urine output was 540 cc over the past 24 hours. Patient in good spirits at present.   Objective:  Vital signs in last 24 hours:  Temp:  [97.8 F (36.6 C)-99.1 F (37.3 C)] 97.8 F (36.6 C) (11/24 0510) Pulse Rate:  [79-90] 79 (11/24 0510) Resp:  [16-20] 16 (11/24 0510) BP: (106-130)/(51-66) 127/60 mmHg (11/24 0510) SpO2:  [100 %] 100 % (11/24 0510)  Weight change:  Filed Weights   06/18/15 0400 06/19/15 0414 06/20/15 0700  Weight: 62.3 kg (137 lb 5.6 oz) 65.5 kg (144 lb 6.4 oz) 59.7 kg (131 lb 9.8 oz)    Intake/Output: I/O last 3 completed shifts: In: 920.4 [P.O.:120; I.V.:800.4] Out: 840 [Urine:540; Stool:300]   Intake/Output this shift:     Physical Exam: General: Chronically ill appearing  Head: Healing wound on forehead  E, ENT: Moist oral mucus membranes  Neck: Supple  Lungs:  Bilateral rhonchi, normal effort   Heart: S1S2 no rubs  Abdomen:  Soft, nontender, BS present   Extremities:  trace peripheral edema.  Neurologic: Awake , able to communicate  Skin: Large forehead scar from prior skin cancer   Left femoral non-tunneled catheter, foley, rectal tube    Basic Metabolic Panel:  Recent Labs Lab 06/19/15 1740 06/19/15 2053 06/20/15 0043 06/20/15 0551 06/21/15 0453 06/22/15 0618 06/23/15 0557  NA 134* 134* 135 135 137 134* 135  K 4.4 4.2 4.1 3.9 4.2 4.1 4.3  CL 104 103 104 105 105 103 102  CO2 27 29 28 28 29 27 27   GLUCOSE 168* 121* 110* 82 81 100* 81  BUN 10 9 9 8 18  31* 40*  CREATININE 0.69 0.61 0.73 0.68 1.73* 2.77* 3.45*  CALCIUM 7.9* 7.8* 7.9* 7.9* 8.0* 7.8* 8.0*  MG 1.5* 2.1 1.8 1.8  --  1.7  --   PHOS 1.9* 2.0* 2.0* 1.7* 2.8 3.0  --     Liver Function Tests:  Recent Labs Lab 06/19/15 1343 06/19/15 1740 06/19/15 2053 06/20/15 0043 06/20/15 0551  ALBUMIN 2.1* 2.2* 2.1* 1.9* 1.9*    No results for input(s): LIPASE, AMYLASE in the last 168 hours. No results for input(s): AMMONIA in the last 168 hours.  CBC:  Recent Labs Lab 06/17/15 0339 06/18/15 0546 06/18/15 1335 06/19/15 0529 06/20/15 0551 06/21/15 0453 06/23/15 0557  WBC 19.0* 20.4*  --  20.2* 15.1* 16.2*  --   HGB 6.8* 6.9* 8.7* 8.2* 7.5* 7.7* 7.1*  HCT 23.7* 23.4* 28.5* 27.2* 25.0* 25.8*  --   MCV 68.7* 69.9*  --  74.7* 75.1* 76.8*  --   PLT 88* 83*  --  119* 106* 131*  --     Cardiac Enzymes: No results for input(s): CKTOTAL, CKMB, CKMBINDEX, TROPONINI in the last 168 hours.  BNP: Invalid input(s): POCBNP  CBG:  Recent Labs Lab 06/22/15 1111 06/22/15 1115 06/22/15 1711 06/22/15 2114 06/23/15 0727  GLUCAP 109* 113* 119* 158* 104*    Microbiology: Results for orders placed or performed during the hospital encounter of 06/10/15  Urine culture     Status: None   Collection Time: 06/10/15  7:30 PM  Result Value Ref Range Status   Specimen Description URINE, RANDOM  Final   Special Requests NONE  Final   Culture NO GROWTH 2 DAYS  Final   Report Status 06/12/2015 FINAL  Final  Blood Culture (  routine x 2)     Status: None   Collection Time: 06/10/15  8:10 PM  Result Value Ref Range Status   Specimen Description BLOOD RIGHT  Final   Special Requests BOTTLES DRAWN AEROBIC AND ANAEROBIC 5CC  Final   Culture  Setup Time   Final    GRAM POSITIVE COCCI AEROBIC BOTTLE ONLY CRITICAL RESULT CALLED TO, READ BACK BY AND VERIFIED WITH: RN Kathi Simpers 06/14/15 1358    Culture   Final    COAGULASE NEGATIVE STAPHYLOCOCCUS Results consistent with contamination.    Report Status 06/16/2015 FINAL  Final  Blood Culture (routine x 2)     Status: None   Collection Time: 06/10/15  8:20 PM  Result Value Ref Range Status   Specimen Description BLOOD RIGHT  Final   Special Requests BOTTLES DRAWN AEROBIC AND ANAEROBIC 2CC AERO, 4CC  Final   Culture NO GROWTH 5 DAYS  Final   Report Status  06/15/2015 FINAL  Final  MRSA PCR Screening     Status: Abnormal   Collection Time: 06/11/15  1:40 AM  Result Value Ref Range Status   MRSA by PCR POSITIVE (A) NEGATIVE Final    Comment:        The GeneXpert MRSA Assay (FDA approved for NASAL specimens only), is one component of a comprehensive MRSA colonization surveillance program. It is not intended to diagnose MRSA infection nor to guide or monitor treatment for MRSA infections. CRITICAL RESULT CALLED TO, READ BACK BY AND VERIFIED WITH: [facility] ALLEN AT [id number] 06/11/15.PMH   Culture, sputum-assessment     Status: None   Collection Time: 06/11/15  4:30 AM  Result Value Ref Range Status   Specimen Description TRACHEAL ASPIRATE  Final   Special Requests NONE  Final   Sputum evaluation THIS SPECIMEN IS ACCEPTABLE FOR SPUTUM CULTURE  Final   Report Status 06/11/2015 FINAL  Final  Culture, respiratory (NON-Expectorated)     Status: None   Collection Time: 06/11/15  4:30 AM  Result Value Ref Range Status   Specimen Description TRACHEAL ASPIRATE  Final   Special Requests NONE Reflexed from [id number]  Final   Gram Stain   Final    GOOD SPECIMEN - 80-90% WBCS MODERATE WBC SEEN MODERATE GRAM POSITIVE COCCI RARE GRAM NEGATIVE RODS    Culture HEAVY GROWTH STREPTOCOCCUS PNEUMONIAE  Final   Report Status 06/13/2015 FINAL  Final   Organism ID, Bacteria STREPTOCOCCUS PNEUMONIAE  Final      Susceptibility   Streptococcus pneumoniae - MIC*    ERYTHROMYCIN <=0.12 SENSITIVE Sensitive     VANCOMYCIN 0.5 SENSITIVE Sensitive     TRIMETH/SULFA <=10 SENSITIVE Sensitive     CLINDAMYCIN <=0.25 SENSITIVE Sensitive     LEVOFLOXACIN Value in next row Sensitive      SENSITIVE1    LINEZOLID Value in next row Sensitive      SENSITIVE<=2    * HEAVY GROWTH STREPTOCOCCUS PNEUMONIAE  C difficile quick scan w PCR reflex     Status: None   Collection Time: 06/20/15  4:30 AM  Result Value Ref Range Status   C Diff antigen NEGATIVE NEGATIVE Final   C Diff  toxin NEGATIVE NEGATIVE Final   C Diff interpretation Negative for C. difficile  Final    Coagulation Studies: No results for input(s): LABPROT, INR in the last 72 hours.  Urinalysis: No results for input(s): COLORURINE, LABSPEC, PHURINE, GLUCOSEU, HGBUR, BILIRUBINUR, KETONESUR, PROTEINUR, UROBILINOGEN, NITRITE, LEUKOCYTESUR in the last 72 hours.  Invalid input(s): APPERANCEUR  Imaging: No results found.   Medications:     . aspirin  81 mg Oral Daily  . budesonide  0.25 mg Nebulization BID  . feeding supplement (ENSURE ENLIVE)  237 mL Oral TID  . ferrous sulfate  220 mg Oral TID WC  . ipratropium-albuterol  3 mL Nebulization Q6H  . oxyCODONE  80 mg Oral Q12H  . predniSONE  10 mg Oral Q breakfast   acetaminophen **OR** [DISCONTINUED] acetaminophen, diazepam, diphenoxylate-atropine, guaiFENesin, HYDROmorphone, ondansetron (ZOFRAN) IV, polyvinyl alcohol, senna-docusate, zolpidem  Assessment/ Plan:  69 y.o. female  with a PMHx of COPD, hypertension, ischemic cardiomyopathy with multiple coronary artery stents, history of ventricular tachycardia, peripheral vascular disease, hyperlipidemia, chronic back pain, history of congestive heart failure, who was admitted to [facility] on 06/10/2015 for evaluation of respiratory failure.   1. Acute renal failure secondary to acute tubular necrosis. Patient presented with multiorgan failure upon presentation. Baseline creatinine 0.75. - Creatinine continues to trend up off of continuous renal replacement therapy. Creatinine currently 3.45 with a urine output of 540 cc over the past 24 hours. No acute indication for dialysis today however if creatinine continues to trend up and oliguria persists we may need to consider restarting the patient on dialysis. She has a left femoral dialysis catheter in place.  2. Hyperkalemia.   - resolved  3. Acute respiratory failure.  - Doing quite well off the ventilator at this point in time. Continue to monitor  respiratory status and oxygen saturation.  4. Acute myocardial infarction.  Management per hospitalist  5.  Anemia unspecified: Hemoglobin drifting down. Currently 7.1. Consider blood transfusion for hemoglobin of less than 7.   LOS: 13 Orval Dortch 11/24/20168:54 AM

## 2015-06-24 DIAGNOSIS — Z9981 Dependence on supplemental oxygen: Secondary | ICD-10-CM

## 2015-06-24 DIAGNOSIS — R948 Abnormal results of function studies of other organs and systems: Secondary | ICD-10-CM

## 2015-06-24 DIAGNOSIS — N19 Unspecified kidney failure: Secondary | ICD-10-CM

## 2015-06-24 DIAGNOSIS — R7989 Other specified abnormal findings of blood chemistry: Secondary | ICD-10-CM

## 2015-06-24 DIAGNOSIS — F17210 Nicotine dependence, cigarettes, uncomplicated: Secondary | ICD-10-CM

## 2015-06-24 DIAGNOSIS — Z992 Dependence on renal dialysis: Secondary | ICD-10-CM

## 2015-06-24 DIAGNOSIS — Z515 Encounter for palliative care: Secondary | ICD-10-CM

## 2015-06-24 DIAGNOSIS — L89151 Pressure ulcer of sacral region, stage 1: Secondary | ICD-10-CM

## 2015-06-24 LAB — BASIC METABOLIC PANEL
Anion gap: 5 (ref 5–15)
BUN: 52 mg/dL — AB (ref 6–20)
CHLORIDE: 105 mmol/L (ref 101–111)
CO2: 28 mmol/L (ref 22–32)
CREATININE: 3.83 mg/dL — AB (ref 0.44–1.00)
Calcium: 8 mg/dL — ABNORMAL LOW (ref 8.9–10.3)
GFR calc Af Amer: 13 mL/min — ABNORMAL LOW (ref >60)
GFR calc non Af Amer: 11 mL/min — ABNORMAL LOW (ref >60)
Glucose, Bld: 86 mg/dL (ref 65–99)
Potassium: 4.5 mmol/L (ref 3.5–5.1)
SODIUM: 138 mmol/L (ref 135–145)

## 2015-06-24 LAB — GLUCOSE, CAPILLARY
GLUCOSE-CAPILLARY: 29 mg/dL — AB (ref 65–99)
GLUCOSE-CAPILLARY: 326 mg/dL — AB (ref 65–99)
GLUCOSE-CAPILLARY: 206 mg/dL — AB (ref 65–99)
GLUCOSE-CAPILLARY: 144 mg/dL — AB (ref 65–99)
Glucose-Capillary: 49 mg/dL — ABNORMAL LOW (ref 65–99)
Glucose-Capillary: 60 mg/dL — ABNORMAL LOW (ref 65–99)
Glucose-Capillary: 12 mg/dL — CL (ref 65–99)
Glucose-Capillary: 34 mg/dL — CL (ref 65–99)
Glucose-Capillary: 131 mg/dL — ABNORMAL HIGH (ref 65–99)
Glucose-Capillary: 107 mg/dL — ABNORMAL HIGH (ref 65–99)
Glucose-Capillary: 173 mg/dL — ABNORMAL HIGH (ref 65–99)
Glucose-Capillary: 175 mg/dL — ABNORMAL HIGH (ref 65–99)

## 2015-06-24 LAB — SEROTONIN RELEASE ASSAY (SRA)
SRA .2 IU/mL UFH Ser-aCnc: 3 % (ref 0–20)
SRA 100IU/mL UFH Ser-aCnc: 1 % (ref 0–20)

## 2015-06-24 MED ORDER — GLUCAGON HCL RDNA (DIAGNOSTIC) 1 MG IJ SOLR
1.0000 mg | Freq: Once | INTRAMUSCULAR | Status: AC
  Administered 2015-06-24: 1 mg via INTRAVENOUS
  Filled 2015-06-24: qty 1

## 2015-06-24 MED ORDER — FUROSEMIDE 10 MG/ML IJ SOLN
20.0000 mg | Freq: Once | INTRAMUSCULAR | Status: AC
  Administered 2015-06-24: 20 mg via INTRAVENOUS
  Filled 2015-06-24: qty 2

## 2015-06-24 MED ORDER — PROMETHAZINE HCL 25 MG PO TABS
12.5000 mg | ORAL_TABLET | Freq: Four times a day (QID) | ORAL | Status: DC | PRN
  Administered 2015-06-25 – 2015-07-04 (×13): 12.5 mg via ORAL
  Filled 2015-06-24 (×14): qty 1

## 2015-06-24 MED ORDER — DEXTROSE 50 % IV SOLN
INTRAVENOUS | Status: AC
  Administered 2015-06-24: 25 mL
  Filled 2015-06-24: qty 50

## 2015-06-24 MED ORDER — DEXTROSE 50 % IV SOLN
INTRAVENOUS | Status: AC
  Administered 2015-06-24: 10:00:00 100 mL
  Filled 2015-06-24: qty 100

## 2015-06-24 NOTE — Progress Notes (Signed)
Palliative Care Update  I spoke with patient's daughter who was in the room when I visited pt.  We discussed pt possibly going to [place].  The alternate plan is SNF with Palliative Care --transitioning to Hospice care or Hospice Home when the time comes.  She is not actively dying today, but if her kidney function does not improve soon, she might be dying within weeks.    This patient is very resilient.  She has been chronically severely ill for years, and yet, she goes on beyond what most feel she could survive. She was actually in [place] 5 yrs ago! She is not against the idea of Hospice Home now, but would prefer to go to SNF if that is possible.   She does not seem to have a place to live (pt told me she can't go to her daughter's and she isn't welcome any longer at her ex's trailer where she had been renting a room from her ex and his girlfriend).  She would like to go to [facility] if she can, but is open to discussing Hospice. If she goes to SNF, she also really needs a LONG TERM arrangement (even if she doesn't survive long term).    She knows her kidneys are not doing well.  However, she did have more urine output today apparently.    Daughter will be here mid-day tomorrow and I will talk with her and pt again then.  Will talk with Social Worker to explore her options for possble SNF stay again (pt has been in facilities quite a lot --want to see if she has Medicare Days left and if not, she could be a Hospice pt while a long term care resident.  Her decubitus wound will need ongoing nursing  Care.  Colleen Can, MD   Colleen Can, MD

## 2015-06-24 NOTE — Progress Notes (Signed)
Patient ID: Lynn Butler, female   DOB: 08/28/1945, 69 y.o.   MRN: [id number] [facility] PROGRESS NOTE  PCP: Juanell Fairly, MD  HPI/Subjective: Patient feeling a little bit better today than yesterday. Always has some shortness of breath and no wheeze has pain.  Objective: Filed Vitals:   06/23/15 2057 06/24/15 0458  BP: 133/58 122/68  Pulse: 91 78  Temp: 98.7 F (37.1 C) 98.4 F (36.9 C)  Resp: 20 18    Filed Weights   06/19/15 0414 06/20/15 0700 06/24/15 0458  Weight: 65.5 kg (144 lb 6.4 oz) 59.7 kg (131 lb 9.8 oz) 61.598 kg (135 lb 12.8 oz)    ROS: Review of Systems  Constitutional: Negative for fever and chills.  Eyes: Negative for blurred vision.  Respiratory: Positive for cough and shortness of breath.   Cardiovascular: Positive for chest pain.  Gastrointestinal: Positive for nausea, abdominal pain and diarrhea. Negative for vomiting and constipation.  Genitourinary: Negative for dysuria.  Musculoskeletal: Positive for back pain. Negative for joint pain.  Neurological: Negative for dizziness and headaches.   Exam: Physical Exam  Constitutional: She is oriented to person, place, and time.  HENT:  Nose: No mucosal edema.  Mouth/Throat: No oropharyngeal exudate or posterior oropharyngeal edema.  Eyes: Conjunctivae, EOM and lids are normal. Pupils are equal, round, and reactive to light.  Neck: No JVD present. Carotid bruit is not present. No edema present. No thyroid mass and no thyromegaly present.  Cardiovascular: S1 normal and S2 normal.  Exam reveals no gallop.   No murmur heard. Pulses:      Dorsalis pedis pulses are 0 on the right side, and 0 on the left side.  Respiratory: No respiratory distress. She has decreased breath sounds in the right lower field and the left lower field. She has wheezes in the right lower field and the left lower field. She has no rhonchi. She has no rales.  GI: Soft. Bowel sounds are normal. There is no tenderness.   Musculoskeletal:       Right ankle: She exhibits no swelling.       Left ankle: She exhibits no swelling.  Lymphadenopathy:    She has no cervical adenopathy.  Neurological: She is alert and oriented to person, place, and time. No cranial nerve deficit.  Skin: Skin is warm. Nails show no clubbing.  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 Skin graft on  forehead with scab. Sacral decubiti stage II.  Psychiatric: She has a normal mood and affect.    Data Reviewed: Basic Metabolic Panel:  Recent Labs Lab 06/19/15 1740 06/19/15 2053 06/20/15 0043 06/20/15 0551 06/21/15 0453 06/22/15 0618 06/23/15 0557 06/24/15 0522  NA 134* 134* 135 135 137 134* 135 138  K 4.4 4.2 4.1 3.9 4.2 4.1 4.3 4.5  CL 104 103 104 105 105 103 102 105  CO2 27 29 28 28 29 27 27 28   GLUCOSE 168* 121* 110* 82 81 100* 81 86  BUN 10 9 9 8 18  31* 40* 52*  CREATININE 0.69 0.61 0.73 0.68 1.73* 2.77* 3.45* 3.83*  CALCIUM 7.9* 7.8* 7.9* 7.9* 8.0* 7.8* 8.0* 8.0*  MG 1.5* 2.1 1.8 1.8  --  1.7  --   --   PHOS 1.9* 2.0* 2.0* 1.7* 2.8 3.0  --   --    Liver Function Tests:  Recent Labs Lab 06/19/15 1343 06/19/15 1740 06/19/15 2053 06/20/15 0043 06/20/15 0551  ALBUMIN 2.1* 2.2* 2.1* 1.9* 1.9*   CBC:  Recent Labs Lab 06/18/15 0546 06/18/15 1335 06/19/15 0529 06/20/15 0551 06/21/15 0453 06/23/15 0557  WBC 20.4*  --  20.2* 15.1* 16.2*  --   HGB 6.9* 8.7* 8.2* 7.5* 7.7* 7.1*  HCT 23.4* 28.5* 27.2* 25.0* 25.8*  --   MCV 69.9*  --  74.7* 75.1* 76.8*  --   PLT 83*  --  119* 106* 131*  --      Recent Results (from the past 240 hour(s))  C difficile quick scan w PCR reflex     Status: None   Collection Time: 06/20/15  4:30 AM  Result Value Ref Range Status   C Diff antigen NEGATIVE NEGATIVE Final   C Diff toxin NEGATIVE NEGATIVE Final   C Diff interpretation Negative for C. difficile  Final    Scheduled Meds: . aspirin  81 mg Oral Daily  . budesonide  0.25 mg Nebulization BID  . feeding  supplement (ENSURE ENLIVE)  237 mL Oral TID  . furosemide  20 mg Intravenous Once  . ipratropium-albuterol  3 mL Nebulization TID  . oxyCODONE  80 mg Oral Q12H  . predniSONE  10 mg Oral Q breakfast    Assessment/Plan:  1. Septic shock secondary to pneumonia. Sputum culture grew out Streptococcus pneumoniae. Patient finished treatment with IV Rocephin. 2. Hypoglycemia- patient was given a dose of glucagon and 2 Amps of D50 this morning for sugar of 29. The patient states that she's eating. I will have the nursing staff check fingersticks every 2 hours today 3. Acute respiratory failure, COPD exacerbation and pneumonia. Patient extubated 11/21. Patient on chronic oxygen at home. Continue taper to prednisone. Continue nebulizer treatments. 4. Acute renal failure with oligoria. Patient was on CRRT and nephrology is monitoring off dialysis at this point. Creatinine is up to 3.83. Check a BMP in the a.m. 5. Acute myocardial infarction NSTEMI- aspirin and medical management. No beta blocker with bronchospasm and end-stage COPD 6. Acute on chronic anemia- status post transfusion and IV iron.  7. Chronic pain syndrome on oxycodone and dilaudid and Valium 8. Weakness- patient will likely need skilled nursing facility. Physical therapy evaluation. 9. Diarrhea- rectal tube out 10. Nausea- patient thinks that secondary to the Zofran. Nurse thinks that secondary to the iron. I got rid of both of these medications. Phenergan as needed for nausea. 11. As per palliative care, patient considering hospice home  Code Status:     Code Status Orders        Start     Ordered   06/19/15 1140  Do not attempt resuscitation (DNR)   Continuous    Question Answer Comment  In the event of cardiac or respiratory ARREST Do not call a "code blue"   In the event of cardiac or respiratory ARREST Do not perform Intubation, CPR, defibrillation or ACLS   In the event of cardiac or respiratory ARREST Use medication by any  route, position, wound care, and other measures to relive pain and suffering. May use oxygen, suction and manual treatment of airway obstruction as needed for comfort.      06/19/15 1140      Disposition Plan: Likely will need skilled nursing facility versus hospice home  Consultants:  Critical care specialist  Nephrology  Palliative care team  Time spent: 22 minutes  [place], [place] [facility]

## 2015-06-24 NOTE — Consult Note (Signed)
WOC wound consult note Reason for Consult: Full thickness wound on dorsal aspect of left foot (chronic) Wound type: Etiology not known. Suspect medical device related pressure injury Pressure Ulcer POA: Yes Measurement: 3cm x 1.5cm x 0.4cm Wound [id number] pink, moist, non-granulating Drainage (amount, consistency, odor) small amount serous exudate on old dressing today Periwound:intact, dry Dressing procedure/placement/frequency: I will implement conservative wound care orders for Nursing, (e.g., saline continually moist) with twice daily changes. The bilateral heels are intact, but boggy and a soft silicone dressing in combination with floatation will be implemented here. [facility] nursing team will not follow, but will remain available to this patient, the nursing and medical teams.  Please re-consult if needed. Thanks, Maudie Flakes, MSN, RN, [place], Arther Abbott  Pager# [phone]

## 2015-06-24 NOTE — Progress Notes (Signed)
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  Hospital course additionally significant for intubatin 11/11-11/20 and CRRT 11/14-11/21 with L temp fem cath still in place.    PT Comments    Pt tolerating treatment session well, initially hesitant to participate due to feeling ill, but after some motivation pt was able to complete PT sesssion as planned, requiring some additional time for delayed processing and bradykinesia. Of note, Hb: last assessed as 7.1, however given pts chronic kidney disease, pt is likely not far from baseline. RN also reviewed with PT recent changes in pt kidney function, with ok to proceed. Pt's greatest limitation continues to be dyspnea on exertion which continues to limit ability to perform functional mobility with confidence or at baseline level. Patient presenting with impairment of strength and activity tolerance, limiting ability to perform ADL and mobility tasks at  baseline level of function. Patient will benefit from skilled intervention to address the above impairments and limitations, in order to restore to prior level of function, improve patient safety upon discharge, and to decrease caregiver burden. Pt is still appropriate for skilled services with STR stay, however is collaborating with hospice services and family to decide which DC location is in the best interest of the patient.     Follow Up Recommendations  SNF     Equipment Recommendations  None recommended by PT    Recommendations for Other Services       Precautions / Restrictions Precautions Precautions: Fall Precaution Comments: contact isolation, sacral  skin breakdown, L temp fem cath Restrictions Weight Bearing Restrictions: No    Mobility  Bed Mobility               General bed mobility comments: deferred secondary to placement of L temp fem cath  Transfers                 General transfer comment: deferred secondary to placement of L temp fem cath  Ambulation/Gait             General Gait Details: deferred secondary to placement of L temp fem cath   Stairs            Wheelchair Mobility    Modified Rankin (Stroke Patients Only)       Balance                                    Cognition Arousal/Alertness: Awake/alert (Pt is drowsy, mildly dysarthric, and labile. ) Behavior During Therapy: [facility] for tasks assessed/performed;Anxious (Pt is very hessitant to procede with therapy, but is willing to participate with low level bed exercises after some encouragement.  ) Overall Cognitive Status: Within Functional Limits for tasks assessed                      Exercises Other Exercises Other Exercises: LUE manually resisted elbow flexion and extension x10.  Other Exercises: Bilat overhead reaching x10 Other Exercises: cross body reaching x10 bilat for rolling initiaion.  Other Exercises: manually resisted ankle plantar flexion 1x10.     General Comments        Pertinent Vitals/Pain  Pain Assessment: No/denies pain    Home Living                      Prior Function            PT Goals (current goals can now be found in the care plan section) Acute Rehab PT Goals Patient Stated Goal: "to get out of here" PT Goal Formulation: With patient Time For Goal Achievement: 07/06/15 Potential to Achieve Goals: Fair Additional Goals Additional Goal #1: Assess and establish goals for unsupported sitting and OOB as appropriate. Progress towards PT goals: PT to reassess next treatment    Frequency  Min 2X/week    PT Plan Current plan remains appropriate     Co-evaluation             End of Session   Activity Tolerance: Patient tolerated treatment well;No increased pain Patient left: in bed;with call bell/phone within reach;with nursing/sitter in room     Time: 1040-1055 PT Time Calculation (min) (ACUTE ONLY): 15 min  Charges:  $Therapeutic Activity: 8-22 mins                    G Codes:      Adelyne Marchese C Jul 15, 2015, 12:18 PM  12:26 PM  Etta Grandchild, PT, DPT [place] License # [id number]

## 2015-06-24 NOTE — Progress Notes (Signed)
[facility]  ROUNDING NOTE   Subjective:  Cr slightly up to 3.83.   UOP 855 over the past 24 hours. However foley taken out now.  Currently awake and alert.   Objective:  Vital signs in last 24 hours:  Temp:  [98.1 F (36.7 C)-98.7 F (37.1 C)] 98.4 F (36.9 C) (11/25 0458) Pulse Rate:  [78-91] 78 (11/25 0458) Resp:  [18-20] 18 (11/25 0458) BP: (104-133)/(57-68) 122/68 mmHg (11/25 0458) SpO2:  [95 %-100 %] 100 % (11/25 0458) Weight:  [61.598 kg (135 lb 12.8 oz)] 61.598 kg (135 lb 12.8 oz) (11/25 0458)  Weight change:  Filed Weights   06/19/15 0414 06/20/15 0700 06/24/15 0458  Weight: 65.5 kg (144 lb 6.4 oz) 59.7 kg (131 lb 9.8 oz) 61.598 kg (135 lb 12.8 oz)    Intake/Output: I/O last 3 completed shifts: In: -  Out: 1055 [Urine:855; Stool:200]   Intake/Output this shift:     Physical Exam: General: Chronically ill appearing  Head: Healing wound on forehead  E, ENT: Moist oral mucus membranes  Neck: Supple  Lungs:  Bilateral rhonchi, normal effort   Heart: S1S2 no rubs  Abdomen:  Soft, nontender, BS present   Extremities:  trace peripheral edema.  Neurologic: Awake, able to communicate  Skin: Large forehead scar from prior skin cancer   Left femoral non-tunneled catheter, rectal tube    Basic Metabolic Panel:  Recent Labs Lab 06/19/15 1740 06/19/15 2053 06/20/15 0043 06/20/15 0551 06/21/15 0453 06/22/15 0618 06/23/15 0557 06/24/15 0522  NA 134* 134* 135 135 137 134* 135 138  K 4.4 4.2 4.1 3.9 4.2 4.1 4.3 4.5  CL 104 103 104 105 105 103 102 105  CO2 27 29 28 28 29 27 27 28   GLUCOSE 168* 121* 110* 82 81 100* 81 86  BUN 10 9 9 8 18  31* 40* 52*  CREATININE 0.69 0.61 0.73 0.68 1.73* 2.77* 3.45* 3.83*  CALCIUM 7.9* 7.8* 7.9* 7.9* 8.0* 7.8* 8.0* 8.0*  MG 1.5* 2.1 1.8 1.8  --  1.7  --   --   PHOS 1.9* 2.0* 2.0* 1.7* 2.8 3.0  --   --     Liver Function Tests:  Recent Labs Lab 06/19/15 1343 06/19/15 1740 06/19/15 2053 06/20/15 0043  06/20/15 0551  ALBUMIN 2.1* 2.2* 2.1* 1.9* 1.9*   No results for input(s): LIPASE, AMYLASE in the last 168 hours. No results for input(s): AMMONIA in the last 168 hours.  CBC:  Recent Labs Lab 06/18/15 0546 06/18/15 1335 06/19/15 0529 06/20/15 0551 06/21/15 0453 06/23/15 0557  WBC 20.4*  --  20.2* 15.1* 16.2*  --   HGB 6.9* 8.7* 8.2* 7.5* 7.7* 7.1*  HCT 23.4* 28.5* 27.2* 25.0* 25.8*  --   MCV 69.9*  --  74.7* 75.1* 76.8*  --   PLT 83*  --  119* 106* 131*  --     Cardiac Enzymes: No results for input(s): CKTOTAL, CKMB, CKMBINDEX, TROPONINI in the last 168 hours.  BNP: Invalid input(s): POCBNP  CBG:  Recent Labs Lab 06/22/15 2114 06/23/15 0727 06/23/15 1620 06/23/15 2110 06/24/15 0749  GLUCAP 158* 104* 116* 134* 26*    Microbiology: Results for orders placed or performed during the hospital encounter of 06/10/15  Urine culture     Status: None   Collection Time: 06/10/15  7:30 PM  Result Value Ref Range Status   Specimen Description URINE, RANDOM  Final   Special Requests NONE  Final   Culture NO GROWTH 2  DAYS  Final   Report Status 06/12/2015 FINAL  Final  Blood Culture (routine x 2)     Status: None   Collection Time: 06/10/15  8:10 PM  Result Value Ref Range Status   Specimen Description BLOOD RIGHT  Final   Special Requests BOTTLES DRAWN AEROBIC AND ANAEROBIC 5CC  Final   Culture  Setup Time   Final    GRAM POSITIVE COCCI AEROBIC BOTTLE ONLY CRITICAL RESULT CALLED TO, READ BACK BY AND VERIFIED WITH: RN Kathi Simpers 06/14/15 1358    Culture   Final    COAGULASE NEGATIVE STAPHYLOCOCCUS Results consistent with contamination.    Report Status 06/16/2015 FINAL  Final  Blood Culture (routine x 2)     Status: None   Collection Time: 06/10/15  8:20 PM  Result Value Ref Range Status   Specimen Description BLOOD RIGHT  Final   Special Requests BOTTLES DRAWN AEROBIC AND ANAEROBIC 2CC AERO, 4CC  Final   Culture NO GROWTH 5 DAYS  Final   Report  Status 06/15/2015 FINAL  Final  MRSA PCR Screening     Status: Abnormal   Collection Time: 06/11/15  1:40 AM  Result Value Ref Range Status   MRSA by PCR POSITIVE (A) NEGATIVE Final    Comment:        The GeneXpert MRSA Assay (FDA approved for NASAL specimens only), is one component of a comprehensive MRSA colonization surveillance program. It is not intended to diagnose MRSA infection nor to guide or monitor treatment for MRSA infections. CRITICAL RESULT CALLED TO, READ BACK BY AND VERIFIED WITH: [facility] ALLEN AT [id number] 06/11/15.PMH   Culture, sputum-assessment     Status: None   Collection Time: 06/11/15  4:30 AM  Result Value Ref Range Status   Specimen Description TRACHEAL ASPIRATE  Final   Special Requests NONE  Final   Sputum evaluation THIS SPECIMEN IS ACCEPTABLE FOR SPUTUM CULTURE  Final   Report Status 06/11/2015 FINAL  Final  Culture, respiratory (NON-Expectorated)     Status: None   Collection Time: 06/11/15  4:30 AM  Result Value Ref Range Status   Specimen Description TRACHEAL ASPIRATE  Final   Special Requests NONE Reflexed from [id number]  Final   Gram Stain   Final    GOOD SPECIMEN - 80-90% WBCS MODERATE WBC SEEN MODERATE GRAM POSITIVE COCCI RARE GRAM NEGATIVE RODS    Culture HEAVY GROWTH STREPTOCOCCUS PNEUMONIAE  Final   Report Status 06/13/2015 FINAL  Final   Organism ID, Bacteria STREPTOCOCCUS PNEUMONIAE  Final      Susceptibility   Streptococcus pneumoniae - MIC*    ERYTHROMYCIN <=0.12 SENSITIVE Sensitive     VANCOMYCIN 0.5 SENSITIVE Sensitive     TRIMETH/SULFA <=10 SENSITIVE Sensitive     CLINDAMYCIN <=0.25 SENSITIVE Sensitive     LEVOFLOXACIN Value in next row Sensitive      SENSITIVE1    LINEZOLID Value in next row Sensitive      SENSITIVE<=2    * HEAVY GROWTH STREPTOCOCCUS PNEUMONIAE  C difficile quick scan w PCR reflex     Status: None   Collection Time: 06/20/15  4:30 AM  Result Value Ref Range Status   C Diff antigen NEGATIVE NEGATIVE Final    C Diff toxin NEGATIVE NEGATIVE Final   C Diff interpretation Negative for C. difficile  Final    Coagulation Studies: No results for input(s): LABPROT, INR in the last 72 hours.  Urinalysis: No results for input(s): COLORURINE, LABSPEC, [place], [place], [place], BILIRUBINUR, KETONESUR,  PROTEINUR, UROBILINOGEN, NITRITE, LEUKOCYTESUR in the last 72 hours.  Invalid input(s): APPERANCEUR    Imaging: No results found.   Medications:     . aspirin  81 mg Oral Daily  . budesonide  0.25 mg Nebulization BID  . feeding supplement (ENSURE ENLIVE)  237 mL Oral TID  . ferrous sulfate  220 mg Oral TID WC  . ipratropium-albuterol  3 mL Nebulization TID  . oxyCODONE  80 mg Oral Q12H  . predniSONE  10 mg Oral Q breakfast   acetaminophen **OR** [DISCONTINUED] acetaminophen, diazepam, diphenoxylate-atropine, guaiFENesin, HYDROmorphone, ondansetron (ZOFRAN) IV, polyvinyl alcohol, senna-docusate, zolpidem  Assessment/ Plan:  69 y.o. female  with a PMHx of COPD, hypertension, ischemic cardiomyopathy with multiple coronary artery stents, history of ventricular tachycardia, peripheral vascular disease, hyperlipidemia, chronic back pain, history of congestive heart failure, who was admitted to [facility] on 06/10/2015 for evaluation of respiratory failure.   1. Acute renal failure secondary to acute tubular necrosis. Patient presented with multiorgan failure upon presentation. Baseline creatinine 0.75. - Cr slowly trending up, UOP was 885cc over the past 24 hours.  Foley taken out however.  Will hold off HD for now, but reassess daily for need.  2. Hyperkalemia.   - K 4.5 this AM, may trend up now that pt eating better.  3. Acute respiratory failure.  - Breathing comfortably off the ventilator, has done well after extubation.  4. Acute myocardial infarction.  Management per hospitalist  5.  Anemia unspecified: last hgb 7.1, transfuse for hgb of less than 7.    LOS: 14 Lynn Butler 11/25/20168:48  AM

## 2015-06-24 NOTE — Care Management Important Message (Signed)
Important Message  Patient Details  Name: Lynn Butler MRN: [id number] Date of Birth: [birth year]   Medicare Important Message Given:       Shelbie Ammons, RN 06/24/2015, 8:44 AM

## 2015-06-24 NOTE — Plan of Care (Signed)
Problem: Nutrition: Goal: Adequate nutrition will be maintained Outcome: Progressing Pt report anxiety once during shift. Pt is still reporting generalized pain. No other signs of distress noted. Will continue to monitor.

## 2015-06-24 NOTE — Consult Note (Signed)
Palliative Medicine Inpatient Consult Follow Up Note   Name: Lynn Butler Date: 06/24/2015 MRN: [id number]  DOB: 24-Oct-1945  Referring Physician: Loletha Grayer, MD  Palliative Care consult requested for this 69 y.o. female for goals of medical therapy in patient with an admission for multi-organ failure.  She is well known to me from a previous admission not too long ago.  She had lived with her ex-husband (and his girlfriend) --renting a small room in his trailer, and then most recently, she had gone to live again with her daughter, Lynn Butler, just prior to needing to come back to the hospital. She has actually been very chronically ill for a number of years and was a patient in [place] about 5 years ago.  Not too long ago, she had a long term hospital stay at [facility].  While there, she had a large left forehead skin graft placed. The scab from this is shrinking since its appearance during her last admission here.  She has been in and out of skilled facilities, long term hospital stays, and living with her daughter or her ex.  She is currently still borderliine for needing hemodialysis again and she has a temporary HD catheter in place.    TODAY'S DISCUSSIONS AND DECISIONS: 1.  Pt continues to be DNR  2.  I have talked with pt several times this visit (we talked some two days ago when she was in the ICU --at that time, she had become extubated and was wanting dialysis if it was needed).   3.  Since then, the pt has expressed to me a desire to have LESS done.  She mentions going to [facility] again if needed --BUT she would consider going to [facility] also.    4.  I met daughter and we talked very briefly in the room and have agreed to meet and talk further tomorrow mid-day.    IMPRESSION: Multi-organ system failure Acute on chronic hypoxic and hypercapneic respiratory failure --intubated and ventilated at admission COPD --oxygen dependent at home Ongoing tobacco smoking (has  never and will never quit she says) Shock --treated with aggressive fluid resuscitation Pneumonia Elevated Liver Enzymes Elevated troponin Lactic Acidosis HTN Ischemic Cardiomyopathy  ---with EF 15-25% as seen on last echo in July of this year CAD ---severe multivessel disease PVD and PAD Vascular insufficiency foot ulcers  Sacral stage 1 ulcer ---present on admission Anemia of chronic disease Skin cancer of left side of forehead --treated with skin graft while she was at [facility] MRSA colonization left foot wound Chronic severe back pain on high dose pain Rxs H/O V Tach H/O cardiac pacer placement (?and removal?) and defibrillator placement Severe Malnutrition due to chronic illness and loss of appetite Insomnia Anxiety     REVIEW OF SYSTEMS:  Pt has chronic pain and her only complaints are usually centered around pain in her back.  She feels weaker than usual.  She denies current shortness of breath, nausea, vomiting, fever, chills, etc but is unable to give complete ROS due to illness and weakness.   CODE STATUS: DNR  Since Nov 17    PAST MEDICAL HISTORY: Past Medical History  Diagnosis Date  . COPD (chronic obstructive pulmonary disease) ([place])   . Hypertension   . Ischemic cardiomyopathy     a.   . Coronary artery disease     a. s/p multiple stenting in 2005  . History of ventricular tachycardia   . PVD (peripheral vascular disease) ([place])   .  PAD (peripheral artery disease) ([place])   . HLD (hyperlipidemia)   . Chronic back pain   . MI, old   . MRSA (methicillin resistant staph aureus) culture positive     left foot wound  . CHF (congestive heart failure) ([place])   . Pneumonia   . Asthma   . Cancer [facility])     PAST SURGICAL HISTORY:  Past Surgical History  Procedure Laterality Date  . Insert / replace / remove pacemaker    . Vascular bypass surgery Bilateral   . Cardiac catheterization N/A 01/14/2015    Procedure: Left Heart Cath;  Surgeon: Wellington Hampshire, MD;  Location: [place] [facility] LAB;  Service: Cardiovascular;  Laterality: N/A;  . Cardiac defibrillator placement    . Appendectomy    . Cesarean section    . Abdominal surgery      Vital Signs: BP 122/68 mmHg  Pulse 78  Temp(Src) 98.4 F (36.9 C) (Oral)  Resp 18  Ht 5' 2"  (1.575 m)  Wt 61.598 kg (135 lb 12.8 oz)  BMI 24.83 kg/m2  SpO2 100% Filed Weights   06/19/15 0414 06/20/15 0700 06/24/15 0458  Weight: 65.5 kg (144 lb 6.4 oz) 59.7 kg (131 lb 9.8 oz) 61.598 kg (135 lb 12.8 oz)    Estimated body mass index is 24.83 kg/(m^2) as calculated from the following:   Height as of this encounter: 5' 2"  (1.575 m).   Weight as of this encounter: 61.598 kg (135 lb 12.8 oz).  PHYSICAL EXAM: Appears fatigued but remains fully alert and oriented Scab where skin graft was located on forehead is shrinking --graft has taken EOMI OP clear Lips a little dry Neck w/o JVD or TM Hrt rrr no m Lungs decreased BS bases *She is working hard to breathe now --using accessory muscles and having conversational and exertional respiratory distress of mild -mod degree Abd soft and NT Ext warm and dry w/o cyanosis or mottling  LABS: CBC:    Component Value Date/Time   WBC 16.2* 06/21/2015 0453   WBC 6.9 11/22/2014 0748   HGB 7.1* 06/23/2015 0557   HGB 10.3* 11/22/2014 0748   HCT 25.8* 06/21/2015 0453   HCT 32.9* 11/22/2014 0748   PLT 131* 06/21/2015 0453   PLT 189 11/26/2014 0428   MCV 76.8* 06/21/2015 0453   MCV 72* 11/22/2014 0748   NEUTROABS 22.7* 06/13/2015 2057   NEUTROABS 4.6 11/22/2014 0748   LYMPHSABS 0.6* 06/13/2015 2057   LYMPHSABS 1.4 11/22/2014 0748   MONOABS 1.2* 06/13/2015 2057   MONOABS 0.8 11/22/2014 0748   EOSABS 0.0 06/13/2015 2057   EOSABS 0.0 11/22/2014 0748   BASOSABS 0.0 06/13/2015 2057   BASOSABS 0.0 11/22/2014 0748   Comprehensive Metabolic Panel:    Component Value Date/Time   NA 138 06/24/2015 0522   NA 139 11/25/2014 0518   K 4.5 06/24/2015  0522   K 3.8 11/25/2014 0518   CL 105 06/24/2015 0522   CL 103 11/25/2014 0518   CO2 28 06/24/2015 0522   CO2 31 11/25/2014 0518   BUN 52* 06/24/2015 0522   BUN 25* 11/25/2014 0518   CREATININE 3.83* 06/24/2015 0522   CREATININE 0.66 11/25/2014 0518   GLUCOSE 86 06/24/2015 0522   GLUCOSE 83 11/25/2014 0518   CALCIUM 8.0* 06/24/2015 0522   CALCIUM 8.0* 11/25/2014 0518   AST 78* 06/16/2015 0520   AST 23 11/15/2014 1819   ALT 407* 06/16/2015 0520   ALT 14 11/15/2014 1819   ALKPHOS 147* 06/16/2015  0520   ALKPHOS 109 11/15/2014 1819   BILITOT 0.6 06/16/2015 0520   BILITOT 0.6 11/15/2014 1819   PROT 4.8* 06/16/2015 0520   PROT 7.1 11/15/2014 1819   ALBUMIN 1.9* 06/20/2015 0551   ALBUMIN 3.3* 11/15/2014 1819   TESTS:    ECHO July 2016: - Left ventricle: The estimated ejection fraction was in the range of 15% to 25%. Diffuse hypokinesis with regional variations. Discrete basal inferior LV aneurysm. - Regional wall motion abnormality: Dyskinesis and aneurysm of the basal inferior myocardium; dyskinesis of the basal inferoseptal and mid inferior myocardium; akinesis of the mid inferoseptal and basal-mid anterolateral myocardium; severe hypokinesis of the basal anterior myocardium; probable dyskinesis and scarring of the basal inferolateral myocardium; probable akinesis and scarring of the mid inferolateral myocardium. - Aortic valve: Calcification. Thickening. Cusp separation was mildly reduced. Valve mobility was restricted. No evidence of vegetation. Valve area (Vmax): 2.42 cm^2. - Mitral valve: There was mild regurgitation. - Left atrium: The atrium was mildly dilated. - Atrial septum: Echo contrast study showed no right-to-left atrial level shunt, at baseline via spectral doppler imaging. - Pericardium, extracardiac: There was a left pleural effusion. Impressions: - Study suggests severe multiple vessel coronary artery disease, probably due to  atherosclerosis, predominantly involving the circumflex and RCA. Study suggests severe ischemic cardiomyopathy due to prior infarction with congestive heart failure, with reduced cardiac output. The dysfunction is both systolic and diastolic. Mild aortic valve disease.     More than 50% of the visit was spent in counseling/coordination of care: YES  Time Spent:  35 min

## 2015-06-24 NOTE — Plan of Care (Signed)
Major problem this shift was hypoglycemic event this am.  Ended up giving 2.5 amps of D50 and dose of glucagon.  Pt improved - but BS is beginning to go down again.  Doing q2hr finger sticks. BUN and creatinine rose again today to 52 and 3.83.  Followed by nephrology.  Pt doesn't want to have dialysis.  Also followed by Palliative Care - discussed w/daughter and patient about possibility of going to [facility].  [facility] is also an option.  Receiving scheduled oxycontin, prn dilaudid and valium for chronic pain.

## 2015-06-24 NOTE — Clinical Social Work Note (Signed)
Clinical Social Worker was consulted for New SNF placement. Per Palliative Care, pt is considering residential hospice. [facility] may be able to accept pt should she require SNF and choses [facility]. Pt has 4 Medicare days. Full assessment to follow. CSW will continue to follow.   [name], MSW, LCSW Clinical Social Worker  [phone]

## 2015-06-24 NOTE — Progress Notes (Signed)
Nutrition Follow-up   INTERVENTION:   Meals and Snacks: Cater to patient preferences Medical Food Supplement Therapy: Continue Ensure as ordered as pt drinking   NUTRITION DIAGNOSIS:   Inadequate oral intake related to inability to eat as evidenced by NPO status; improving with advanced diet order  GOAL:   Patient will meet greater than or equal to 90% of their needs; ongoing  MONITOR:    (Energy Intake, Pulmonary Profile, Anthropometrics, Gastrointestinal Profile, Electrolyte and renal Profile)   ASSESSMENT:   Pt admitted after being found unresponsive by family. Pt currently sedated on the vent secondary to septic shock with acute on chronic respiratory failure.   Pt now on floor, had just eaten breakfast on visit. Per MD note, rectal tube removed yesterday, pt c.diff negative, recorded loose stools this am.  Diet Order:  Diet regular Room service appropriate?: Yes; Fluid consistency:: Thin    Current Nutrition: Pt reports eating 100% of omelete this am and 100% of Ensure. Pt drinking Chocolate milk on visit. Recorded po intake 51% of meals recorded. Pt reports liking and drinking Ensures well.    Gastrointestinal Profile: Last BM: 06/23/2015 loose stools, c.diff negative   Scheduled Medications:  . aspirin  81 mg Oral Daily  . budesonide  0.25 mg Nebulization BID  . feeding supplement (ENSURE ENLIVE)  237 mL Oral TID  . ipratropium-albuterol  3 mL Nebulization TID  . oxyCODONE  80 mg Oral Q12H  . predniSONE  10 mg Oral Q breakfast     Electrolyte/Renal Profile and Glucose Profile:   Recent Labs Lab 06/20/15 0043 06/20/15 0551 06/21/15 0453 06/22/15 0618 06/23/15 0557 06/24/15 0522  NA 135 135 137 134* 135 138  K 4.1 3.9 4.2 4.1 4.3 4.5  CL 104 105 105 103 102 105  CO2 28 28 29 27 27 28   BUN 9 8 18  31* 40* 52*  CREATININE 0.73 0.68 1.73* 2.77* 3.45* 3.83*  CALCIUM 7.9* 7.9* 8.0* 7.8* 8.0* 8.0*  MG 1.8 1.8  --  1.7  --   --   PHOS 2.0* 1.7* 2.8 3.0   --   --   GLUCOSE 110* 82 81 100* 81 86   Protein Profile:  Recent Labs Lab 06/19/15 2053 06/20/15 0043 06/20/15 0551  ALBUMIN 2.1* 1.9* 1.9*     Weight Trend since Admission: Filed Weights   06/19/15 0414 06/20/15 0700 06/24/15 0458  Weight: 144 lb 6.4 oz (65.5 kg) 131 lb 9.8 oz (59.7 kg) 135 lb 12.8 oz (61.598 kg)    BMI:  Body mass index is 24.83 kg/(m^2).  Estimated Nutritional Needs:   Kcal:  BEE 1083 kcals (If 1.1-1.3, AF 1.2)1429-1689 kcals/d.   Protein:  (1.5-2.0 g/kg) 90-120 g/d  Fluid:  (25-77ml/kg) 1500-1851ml/d  EDUCATION NEEDS:   Education needs no appropriate at this time    [place], RD, LDN Pager [phone]

## 2015-06-25 LAB — GLUCOSE, CAPILLARY
GLUCOSE-CAPILLARY: 82 mg/dL (ref 65–99)
GLUCOSE-CAPILLARY: 140 mg/dL — AB (ref 65–99)
GLUCOSE-CAPILLARY: 76 mg/dL (ref 65–99)
Glucose-Capillary: 106 mg/dL — ABNORMAL HIGH (ref 65–99)
Glucose-Capillary: 110 mg/dL — ABNORMAL HIGH (ref 65–99)
Glucose-Capillary: 110 mg/dL — ABNORMAL HIGH (ref 65–99)
Glucose-Capillary: 115 mg/dL — ABNORMAL HIGH (ref 65–99)
Glucose-Capillary: 122 mg/dL — ABNORMAL HIGH (ref 65–99)
Glucose-Capillary: 143 mg/dL — ABNORMAL HIGH (ref 65–99)
Glucose-Capillary: 26 mg/dL — CL (ref 65–99)
Glucose-Capillary: 44 mg/dL — CL (ref 65–99)
Glucose-Capillary: 85 mg/dL (ref 65–99)

## 2015-06-25 LAB — CBC
HCT: 22.1 % — ABNORMAL LOW (ref 35.0–47.0)
HEMOGLOBIN: 6.9 g/dL — AB (ref 12.0–16.0)
MCH: 24.4 pg — AB (ref 26.0–34.0)
MCHC: 31.3 g/dL — ABNORMAL LOW (ref 32.0–36.0)
MCV: 77.9 fL — ABNORMAL LOW (ref 80.0–100.0)
PLATELETS: 163 10*3/uL (ref 150–440)
RBC: 2.83 MIL/uL — AB (ref 3.80–5.20)
RDW: 29.9 % — ABNORMAL HIGH (ref 11.5–14.5)
WBC: 8.1 10*3/uL (ref 3.6–11.0)

## 2015-06-25 LAB — PHOSPHORUS: Phosphorus: 3.2 mg/dL (ref 2.5–4.6)

## 2015-06-25 LAB — BASIC METABOLIC PANEL
Anion gap: 8 (ref 5–15)
BUN: 61 mg/dL — AB (ref 6–20)
CHLORIDE: 102 mmol/L (ref 101–111)
CO2: 27 mmol/L (ref 22–32)
Calcium: 8 mg/dL — ABNORMAL LOW (ref 8.9–10.3)
Creatinine, Ser: 3.98 mg/dL — ABNORMAL HIGH (ref 0.44–1.00)
GFR calc Af Amer: 12 mL/min — ABNORMAL LOW (ref >60)
GFR calc non Af Amer: 11 mL/min — ABNORMAL LOW (ref >60)
GLUCOSE: 88 mg/dL (ref 65–99)
POTASSIUM: 4.3 mmol/L (ref 3.5–5.1)
Sodium: 137 mmol/L (ref 135–145)

## 2015-06-25 LAB — HEMOGLOBIN: Hemoglobin: 7.6 g/dL — ABNORMAL LOW (ref 12.0–16.0)

## 2015-06-25 MED ORDER — HEPARIN SODIUM (PORCINE) 1000 UNIT/ML DIALYSIS
1000.0000 [IU] | INTRAMUSCULAR | Status: DC | PRN
  Filled 2015-06-25: qty 1

## 2015-06-25 MED ORDER — DEXTROSE 5 % IV SOLN
INTRAVENOUS | Status: DC
  Administered 2015-06-25 – 2015-06-26 (×2): via INTRAVENOUS

## 2015-06-25 MED ORDER — PENTAFLUOROPROP-TETRAFLUOROETH EX AERO: 1.0000 "application " | INHALATION_SPRAY | CUTANEOUS | Status: DC | PRN

## 2015-06-25 MED ORDER — LIDOCAINE-PRILOCAINE 2.5-2.5 % EX CREA
1.0000 | TOPICAL_CREAM | CUTANEOUS | Status: DC | PRN
Start: 2015-06-25 — End: 2015-07-05

## 2015-06-25 MED ORDER — LIDOCAINE HCL (PF) 1 % IJ SOLN
5.0000 mL | INTRAMUSCULAR | Status: DC | PRN
  Filled 2015-06-25: qty 5

## 2015-06-25 MED ORDER — SODIUM CHLORIDE 0.9 % IV SOLN: 100.0000 mL | INTRAVENOUS | Status: DC | PRN

## 2015-06-25 MED ORDER — DEXTROSE 50 % IV SOLN
INTRAVENOUS | Status: AC
  Filled 2015-06-25: qty 50

## 2015-06-25 MED ORDER — DEXTROSE 50 % IV SOLN
1.0000 | Freq: Once | INTRAVENOUS | Status: AC
  Administered 2015-06-25: 06:00:00 50 mL via INTRAVENOUS

## 2015-06-25 MED ORDER — ALTEPLASE 2 MG IJ SOLR
2.0000 mg | Freq: Once | INTRAMUSCULAR | Status: DC | PRN
  Filled 2015-06-25: qty 2

## 2015-06-25 NOTE — Progress Notes (Signed)
Patient ID: Lynn Butler, female   DOB: 1946/07/08, 69 y.o.   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 She is confined to the bitter end. Some cough and shortness of breath. Soreness all over. Had hypoglycemic episode again today and had to be put on D5 drip.  Objective: Filed Vitals:   06/24/15 2121 06/25/15 0426  BP: 125/53 138/73  Pulse: 157 92  Temp: 98 F (36.7 C) 98 F (36.7 C)  Resp: 18 18    Filed Weights   06/19/15 0414 06/20/15 0700 06/24/15 0458  Weight: 65.5 kg (144 lb 6.4 oz) 59.7 kg (131 lb 9.8 oz) 61.598 kg (135 lb 12.8 oz)    ROS: Review of Systems  Constitutional: Negative for fever and chills.  Eyes: Negative for blurred vision.  Respiratory: Positive for cough and shortness of breath.   Cardiovascular: Negative for chest pain.  Gastrointestinal: Positive for nausea, abdominal pain and diarrhea. Negative for vomiting and constipation.  Genitourinary: Negative for dysuria.  Musculoskeletal: Positive for back pain. Negative for joint pain.  Neurological: Negative for dizziness and headaches.   Exam: Physical Exam  Constitutional: She is oriented to person, place, and time.  HENT:  Nose: No mucosal edema.  Mouth/Throat: No oropharyngeal exudate or posterior oropharyngeal edema.  Eyes: Conjunctivae, EOM and lids are normal. Pupils are equal, round, and reactive to light.  Neck: No JVD present. Carotid bruit is not present. No edema present. No thyroid mass and no thyromegaly present.  Cardiovascular: S1 normal and S2 normal.  Exam reveals no gallop.   No murmur heard. Pulses:      Dorsalis pedis pulses are 0 on the right side, and 0 on the left side.  Respiratory: Accessory muscle usage present. No respiratory distress. She has decreased breath sounds in the right lower field and the left lower field. She has no wheezes. She has no rhonchi. She has rales in the  right lower field and the left lower field.  GI: Soft. Bowel sounds are normal. There is no tenderness.  Musculoskeletal:       Right ankle: She exhibits no swelling.       Left ankle: She exhibits no swelling.  Lymphadenopathy:    She has no cervical adenopathy.  Neurological: She is alert and oriented to person, place, and time. No cranial nerve deficit.  Skin: Skin is warm. Nails show no clubbing.  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 Skin graft on  forehead with scab. Sacral decubiti stage II.  Psychiatric: She has a normal mood and affect.    Data Reviewed: Basic Metabolic Panel:  Recent Labs Lab 06/19/15 1740 06/19/15 2053 06/20/15 0043 06/20/15 0551 06/21/15 0453 06/22/15 0618 06/23/15 0557 06/24/15 0522 06/25/15 0554  NA 134* 134* 135 135 137 134* 135 138 137  K 4.4 4.2 4.1 3.9 4.2 4.1 4.3 4.5 4.3  CL 104 103 104 105 105 103 102 105 102  CO2 27 29 28 28 29 27 27 28 27   GLUCOSE 168* 121* 110* 82 81 100* 81 86 88  BUN 10 9 9 8 18  31* 40* 52* 61*  CREATININE 0.69 0.61 0.73 0.68 1.73* 2.77* 3.45* 3.83* 3.98*  CALCIUM 7.9* 7.8* 7.9* 7.9* 8.0* 7.8* 8.0* 8.0* 8.0*  MG 1.5* 2.1 1.8 1.8  --  1.7  --   --   --   PHOS 1.9* 2.0* 2.0* 1.7* 2.8 3.0  --   --   --  Liver Function Tests:  Recent Labs Lab 06/19/15 1343 06/19/15 1740 06/19/15 2053 06/20/15 0043 06/20/15 0551  ALBUMIN 2.1* 2.2* 2.1* 1.9* 1.9*   CBC:  Recent Labs Lab 06/18/15 1335 06/19/15 0529 06/20/15 0551 06/21/15 0453 06/23/15 0557 06/25/15 0554  WBC  --  20.2* 15.1* 16.2*  --   --   HGB 8.7* 8.2* 7.5* 7.7* 7.1* 7.6*  HCT 28.5* 27.2* 25.0* 25.8*  --   --   MCV  --  74.7* 75.1* 76.8*  --   --   PLT  --  119* 106* 131*  --   --      Recent Results (from the past 240 hour(s))  C difficile quick scan w PCR reflex     Status: None   Collection Time: 06/20/15  4:30 AM  Result Value Ref Range Status   C Diff antigen NEGATIVE NEGATIVE Final   C Diff toxin NEGATIVE NEGATIVE Final    C Diff interpretation Negative for C. difficile  Final    Scheduled Meds: . aspirin  81 mg Oral Daily  . budesonide  0.25 mg Nebulization BID  . dextrose      . feeding supplement (ENSURE ENLIVE)  237 mL Oral TID  . ipratropium-albuterol  3 mL Nebulization TID  . oxyCODONE  80 mg Oral Q12H  . predniSONE  10 mg Oral Q breakfast    Assessment/Plan:  1. Septic shock secondary to pneumonia. Sputum culture grew out Streptococcus pneumoniae. Patient finished treatment with IV Rocephin. 2. Hypoglycemia- patient placed on D5 drip secondary to repeated hypoglycemia. 3. Acute respiratory failure, COPD exacerbation and pneumonia. Patient extubated 11/21. Patient on chronic oxygen at home. I likely will continue chronic prednisone also. Continue nebulizer treatments. 4. Acute renal failure with oligoria. Creatinine has worsened today to 3.98. Nephrology has put in orders for dialysis today 5. Acute myocardial infarction NSTEMI- aspirin and medical management. No beta blocker with bronchospasm and end-stage COPD 6. Acute on chronic anemia- status post transfusion and IV iron.  7. Chronic pain syndrome on oxycodone and dilaudid and Valium 8. Weakness- patient will likely need skilled nursing facility. Physical therapy evaluation. 9. Diarrhea- rectal tube out 10. Nausea- Phenergan as needed for nausea. 11. As per palliative care, patient considering hospice home but today she told me she is confided to the bitter end.  Code Status:     Code Status Orders        Start     Ordered   06/19/15 1140  Do not attempt resuscitation (DNR)   Continuous    Question Answer Comment  In the event of cardiac or respiratory ARREST Do not call a "code blue"   In the event of cardiac or respiratory ARREST Do not perform Intubation, CPR, defibrillation or ACLS   In the event of cardiac or respiratory ARREST Use medication by any route, position, wound care, and other measures to relive pain and suffering. May  use oxygen, suction and manual treatment of airway obstruction as needed for comfort.      06/19/15 1140      Disposition Plan: Likely will need skilled nursing facility versus hospice home. Still need to decide what to do with her kidney function and dialysis if needed.  Consultants:  Critical care specialist  Nephrology  Palliative care team  Time spent: 20 minutes  Loletha Grayer  [facility]

## 2015-06-25 NOTE — Progress Notes (Signed)
Nurse notified me of hgb of 6.9 it was 7.1 this am. It appears that they may start EPOGEN as per nephro consult. Will not transfuse at this time as it seems she is around her baseline. CBC for am ordered She also just got back from HD so needs PRBC while in HD mabe tomorrow.

## 2015-06-25 NOTE — Progress Notes (Signed)
Palliative Medicine Inpatient Consult Follow Up Note   Name: Lynn Butler Date: 06/25/2015 MRN: [id number]  DOB: 07-29-1946  Referring Physician: Loletha Grayer, MD  Palliative Care consult requested for this 69 y.o. female for goals of medical therapy in patient with multi-organ failure.  She is well known to me from a previous admission.     TODAY'S DISCUSSIONS AND DECISIONS: Pt says she does not want to be intubated ever again.  She does not ever want a feeding tube.  She doesn't think she wants dialysis ever again --but she was not as definite on this. She began to cry saying she has been through too much and is tired of going through things.  She is open to [facility], but wants to talk to her daughter more about this.    I spoke with pt's daughter, Lynn Butler.  She had initially not been wanting something so 'terminal' as [place], but she favors this option now.  She feels that if the pt goes to rehab, she will only be there a short time, and just like has 'always happened', she will then be back with no appropriate place to live. Lynn Butler feels that she cannot have her mother come back there to her home again. She says that her mother will be 'fine' for two days max, and then she will be deathly ill and in need of another hospital stay.  She feels that her mother would be taken care of very well at [facility], and she recognizes that this time, things are different:  Her mother isn't 'bouncing back' like other times.  She would favor Hospice Home over SNF --if her mother agrees and if we agree.  I left this discussion to be continued on Monday. Perhaps we can have Hospice talk with pt on Monday.  Also, by then, we will have more info about pts creatinine and urine output.  I noticed that the pt was very weak during PT (I observed the PT session and spoke with PT).  She seems to have quite a bit of exertional dyspnea symptoms --though her sats stayed OK.  She isn't sure she wants to  have more therapy and is inclined to want to rest and have her pain symptoms treated.  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 MRSA colonization left foot wound Chronic severe back pain on high dose pain Rxs H/O V Tach H/O cardiac pacer placement (?and removal?) and defibrillator placement Severe Malnutrition due to chronic illness and loss of appetite Insomnia Anxiety   REVIEW OF SYSTEMS:  Patient is not able to provide ROS due to being exhausted   CODE STATUS: DNR   PAST MEDICAL HISTORY: Past Medical History  Diagnosis Date  . COPD (chronic obstructive pulmonary disease) ([place])   . Hypertension   . Ischemic cardiomyopathy  a.   . Coronary artery disease     a. s/p multiple stenting in 2005  . History of ventricular tachycardia   . PVD (peripheral vascular disease) ([place])   . PAD (peripheral artery disease) ([place])   . HLD (hyperlipidemia)   . Chronic back pain   . MI, old   . MRSA (methicillin resistant staph aureus) culture positive     left foot wound  . CHF (congestive heart failure) ([place])   . Pneumonia   . Asthma   . Cancer [facility])     PAST SURGICAL HISTORY:  Past Surgical History  Procedure Laterality Date  . Insert /  replace / remove pacemaker    . Vascular bypass surgery Bilateral   . Cardiac catheterization N/A 01/14/2015    Procedure: Left Heart Cath;  Surgeon: Wellington Hampshire, MD;  Location: [place] CV LAB;  Service: Cardiovascular;  Laterality: N/A;  . Cardiac defibrillator placement    . Appendectomy    . Cesarean section    . Abdominal surgery      Vital Signs: BP 138/73 mmHg  Pulse 92  Temp(Src) 98 F (36.7 C) (Oral)  Resp 18  Ht 5\' 2"  (1.575 m)  Wt 61.598 kg (135 lb 12.8 oz)  BMI 24.83 kg/m2  SpO2 100% Filed Weights   06/19/15 0414 06/20/15 0700 06/24/15 0458  Weight: 65.5 kg (144 lb 6.4 oz) 59.7 kg (131 lb 9.8 oz) 61.598 kg (135 lb 12.8 oz)    Estimated body mass index is 24.83 kg/(m^2) as calculated from the following:   Height as of this encounter: 5\' 2"  (1.575 m).   Weight as of this encounter: 61.598 kg (135 lb 12.8 oz).  PHYSICAL EXAM: Using acces muscles to breath between minimal work w/ PT (not out of bed --just using arms and moving toes) Fatigued No JVD  Hrt rrr no m Lungs with ronchi Abd soft NT Skin --healing skin graft forehead Stage 1 sacral decub (per nurse report) --distal foot ulcer(s) are dressed Feet are ice cold Alert and oriented --and crying about wanting not to have to have dialysis  LABS: CBC:    Component Value Date/Time   WBC 16.2* 06/21/2015 0453   WBC 6.9 11/22/2014 0748   HGB 7.6* 06/25/2015 0554   HGB 10.3* 11/22/2014 0748   HCT 25.8* 06/21/2015 0453   HCT 32.9* 11/22/2014 0748   PLT 131* 06/21/2015 0453   PLT 189 11/26/2014 0428   MCV 76.8* 06/21/2015 0453   MCV 72* 11/22/2014 0748   NEUTROABS 22.7* 06/13/2015 2057   NEUTROABS 4.6 11/22/2014 0748   LYMPHSABS 0.6* 06/13/2015 2057   LYMPHSABS 1.4 11/22/2014 0748   MONOABS 1.2* 06/13/2015 2057   MONOABS 0.8 11/22/2014 0748   EOSABS 0.0 06/13/2015 2057   EOSABS 0.0 11/22/2014 0748   BASOSABS 0.0 06/13/2015 2057   BASOSABS 0.0 11/22/2014 0748   Comprehensive Metabolic  Panel:    Component Value Date/Time   NA 137 06/25/2015 0554   NA 139 11/25/2014 0518   K 4.3 06/25/2015 0554   K 3.8 11/25/2014 0518   CL 102 06/25/2015 0554   CL 103 11/25/2014 0518   CO2 27 06/25/2015 0554   CO2 31 11/25/2014 0518   BUN 61* 06/25/2015 0554   BUN 25* 11/25/2014 0518   CREATININE 3.98* 06/25/2015 0554   CREATININE 0.66 11/25/2014 0518   GLUCOSE 88 06/25/2015 0554   GLUCOSE 83 11/25/2014 0518   CALCIUM 8.0* 06/25/2015 0554   CALCIUM 8.0* 11/25/2014 0518  AST 78* 06/16/2015 0520   AST 23 11/15/2014 1819   ALT 407* 06/16/2015 0520   ALT 14 11/15/2014 1819   ALKPHOS 147* 06/16/2015 0520   ALKPHOS 109 11/15/2014 1819   BILITOT 0.6 06/16/2015 0520   BILITOT 0.6 11/15/2014 1819   PROT 4.8* 06/16/2015 0520   PROT 7.1 11/15/2014 1819   ALBUMIN 1.9* 06/20/2015 0551   ALBUMIN 3.3* 11/15/2014 1819    More than 50% of the visit was spent in counseling/coordination of care: YES  Time Spent: 35 min

## 2015-06-25 NOTE — Progress Notes (Signed)
[facility]  ROUNDING NOTE   Subjective:  Renal function appears to be worsening. Creatinine up to 3.98 with a BUN of 61. Patient having urine output however appears to be incontinent as foley taken out.  Objective:  Vital signs in last 24 hours:  Temp:  [98 F (36.7 C)] 98 F (36.7 C) (11/26 0426) Pulse Rate:  [92-157] 92 (11/26 0426) Resp:  [18] 18 (11/26 0426) BP: (125-138)/(53-73) 138/73 mmHg (11/26 0426) SpO2:  [93 %-100 %] 100 % (11/26 0717)  Weight change:  Filed Weights   06/19/15 0414 06/20/15 0700 06/24/15 0458  Weight: 65.5 kg (144 lb 6.4 oz) 59.7 kg (131 lb 9.8 oz) 61.598 kg (135 lb 12.8 oz)    Intake/Output: I/O last 3 completed shifts: In: -  Out: 12 [Urine:11; Stool:1]   Intake/Output this shift:     Physical Exam: General: Chronically ill appearing  Head: Healing wound on forehead  E, ENT: Moist oral mucus membranes  Neck: Supple  Lungs:  Bilateral rhonchi, normal effort   Heart: S1S2 no rubs  Abdomen:  Soft, nontender, BS present   Extremities:  trace peripheral edema.  Neurologic: Awake, alert, following commands  Skin: Large forehead scar from prior skin cancer   Left femoral non-tunneled catheter, rectal tube    Basic Metabolic Panel:  Recent Labs Lab 06/19/15 1740 06/19/15 2053 06/20/15 0043 06/20/15 0551 06/21/15 0453 06/22/15 0618 06/23/15 0557 06/24/15 0522 06/25/15 0554  NA 134* 134* 135 135 137 134* 135 138 137  K 4.4 4.2 4.1 3.9 4.2 4.1 4.3 4.5 4.3  CL 104 103 104 105 105 103 102 105 102  CO2 27 29 28 28 29 27 27 28 27   GLUCOSE 168* 121* 110* 82 81 100* 81 86 88  BUN 10 9 9 8 18  31* 40* 52* 61*  CREATININE 0.69 0.61 0.73 0.68 1.73* 2.77* 3.45* 3.83* 3.98*  CALCIUM 7.9* 7.8* 7.9* 7.9* 8.0* 7.8* 8.0* 8.0* 8.0*  MG 1.5* 2.1 1.8 1.8  --  1.7  --   --   --   PHOS 1.9* 2.0* 2.0* 1.7* 2.8 3.0  --   --   --     Liver Function Tests:  Recent Labs Lab 06/19/15 1343 06/19/15 1740 06/19/15 2053 06/20/15 0043  06/20/15 0551  ALBUMIN 2.1* 2.2* 2.1* 1.9* 1.9*   No results for input(s): LIPASE, AMYLASE in the last 168 hours. No results for input(s): AMMONIA in the last 168 hours.  CBC:  Recent Labs Lab 06/18/15 1335 06/19/15 0529 06/20/15 0551 06/21/15 0453 06/23/15 0557 06/25/15 0554  WBC  --  20.2* 15.1* 16.2*  --   --   HGB 8.7* 8.2* 7.5* 7.7* 7.1* 7.6*  HCT 28.5* 27.2* 25.0* 25.8*  --   --   MCV  --  74.7* 75.1* 76.8*  --   --   PLT  --  119* 106* 131*  --   --     Cardiac Enzymes: No results for input(s): CKTOTAL, CKMB, CKMBINDEX, TROPONINI in the last 168 hours.  BNP: Invalid input(s): POCBNP  CBG:  Recent Labs Lab 06/25/15 0532 06/25/15 0602 06/25/15 0632 06/25/15 0737 06/25/15 1028  GLUCAP 44* 26* 143* 140* 53    Microbiology: Results for orders placed or performed during the hospital encounter of 06/10/15  Urine culture     Status: None   Collection Time: 06/10/15  7:30 PM  Result Value Ref Range Status   Specimen Description URINE, RANDOM  Final   Special Requests NONE  Final   Culture NO GROWTH 2 DAYS  Final   Report Status 06/12/2015 FINAL  Final  Blood Culture (routine x 2)     Status: None   Collection Time: 06/10/15  8:10 PM  Result Value Ref Range Status   Specimen Description BLOOD RIGHT  Final   Special Requests BOTTLES DRAWN AEROBIC AND ANAEROBIC 5CC  Final   Culture  Setup Time   Final    GRAM POSITIVE COCCI AEROBIC BOTTLE ONLY CRITICAL RESULT CALLED TO, READ BACK BY AND VERIFIED WITH: RN Kathi Simpers 06/14/15 1358    Culture   Final    COAGULASE NEGATIVE STAPHYLOCOCCUS Results consistent with contamination.    Report Status 06/16/2015 FINAL  Final  Blood Culture (routine x 2)     Status: None   Collection Time: 06/10/15  8:20 PM  Result Value Ref Range Status   Specimen Description BLOOD RIGHT  Final   Special Requests BOTTLES DRAWN AEROBIC AND ANAEROBIC 2CC AERO, 4CC  Final   Culture NO GROWTH 5 DAYS  Final   Report Status  06/15/2015 FINAL  Final  MRSA PCR Screening     Status: Abnormal   Collection Time: 06/11/15  1:40 AM  Result Value Ref Range Status   MRSA by PCR POSITIVE (A) NEGATIVE Final    Comment:        The GeneXpert MRSA Assay (FDA approved for NASAL specimens only), is one component of a comprehensive MRSA colonization surveillance program. It is not intended to diagnose MRSA infection nor to guide or monitor treatment for MRSA infections. CRITICAL RESULT CALLED TO, READ BACK BY AND VERIFIED WITH: [facility] ALLEN AT [id number] 06/11/15.PMH   Culture, sputum-assessment     Status: None   Collection Time: 06/11/15  4:30 AM  Result Value Ref Range Status   Specimen Description TRACHEAL ASPIRATE  Final   Special Requests NONE  Final   Sputum evaluation THIS SPECIMEN IS ACCEPTABLE FOR SPUTUM CULTURE  Final   Report Status 06/11/2015 FINAL  Final  Culture, respiratory (NON-Expectorated)     Status: None   Collection Time: 06/11/15  4:30 AM  Result Value Ref Range Status   Specimen Description TRACHEAL ASPIRATE  Final   Special Requests NONE Reflexed from [id number]  Final   Gram Stain   Final    GOOD SPECIMEN - 80-90% WBCS MODERATE WBC SEEN MODERATE GRAM POSITIVE COCCI RARE GRAM NEGATIVE RODS    Culture HEAVY GROWTH STREPTOCOCCUS PNEUMONIAE  Final   Report Status 06/13/2015 FINAL  Final   Organism ID, Bacteria STREPTOCOCCUS PNEUMONIAE  Final      Susceptibility   Streptococcus pneumoniae - MIC*    ERYTHROMYCIN <=0.12 SENSITIVE Sensitive     VANCOMYCIN 0.5 SENSITIVE Sensitive     TRIMETH/SULFA <=10 SENSITIVE Sensitive     CLINDAMYCIN <=0.25 SENSITIVE Sensitive     LEVOFLOXACIN Value in next row Sensitive      SENSITIVE1    LINEZOLID Value in next row Sensitive      SENSITIVE<=2    * HEAVY GROWTH STREPTOCOCCUS PNEUMONIAE  C difficile quick scan w PCR reflex     Status: None   Collection Time: 06/20/15  4:30 AM  Result Value Ref Range Status   C Diff antigen NEGATIVE NEGATIVE Final   C Diff  toxin NEGATIVE NEGATIVE Final   C Diff interpretation Negative for C. difficile  Final    Coagulation Studies: No results for input(s): LABPROT, INR in the last 72 hours.  Urinalysis: No results for input(s):  COLORURINE, LABSPEC, [place], GLUCOSEU, HGBUR, BILIRUBINUR, KETONESUR, PROTEINUR, UROBILINOGEN, NITRITE, LEUKOCYTESUR in the last 72 hours.  Invalid input(s): APPERANCEUR    Imaging: No results found.   Medications:   . dextrose 30 mL/hr at 06/25/15 0956   . aspirin  81 mg Oral Daily  . budesonide  0.25 mg Nebulization BID  . dextrose      . feeding supplement (ENSURE ENLIVE)  237 mL Oral TID  . ipratropium-albuterol  3 mL Nebulization TID  . oxyCODONE  80 mg Oral Q12H  . predniSONE  10 mg Oral Q breakfast   acetaminophen **OR** [DISCONTINUED] acetaminophen, diazepam, diphenoxylate-atropine, guaiFENesin, HYDROmorphone, polyvinyl alcohol, promethazine, senna-docusate, zolpidem  Assessment/ Plan:  69 y.o. female  with a PMHx of COPD, hypertension, ischemic cardiomyopathy with multiple coronary artery stents, history of ventricular tachycardia, peripheral vascular disease, hyperlipidemia, chronic back pain, history of congestive heart failure, who was admitted to [facility] on 06/10/2015 for evaluation of respiratory failure.   1. Acute renal failure secondary to acute tubular necrosis. Patient presented with multiorgan failure upon presentation. Baseline creatinine 0.75. - Cr up to [id number], unable to ascertain exact urine outpt.  Will plan for HD today, with UF target of 1kg.    2. Hyperkalemia.   - K 4.3 at the moment, continue to monitor.    3. Acute respiratory failure.  - remains off the vent at the moment.  Breathing comfortably.  4. Acute myocardial infarction.  Appears resolved.  5.  Anemia unspecified: last hgb up to 7.6, may consider starting on epogen.  LOS: 15 Lynn Butler 11/26/201611:11 AM

## 2015-06-25 NOTE — Progress Notes (Signed)
Reported to Dr Benjie Karvonen that patient has hemoglobin of 6.9. Acknowledged, no orders given.

## 2015-06-26 LAB — GLUCOSE, CAPILLARY
GLUCOSE-CAPILLARY: 61 mg/dL — AB (ref 65–99)
GLUCOSE-CAPILLARY: 73 mg/dL (ref 65–99)
GLUCOSE-CAPILLARY: 146 mg/dL — AB (ref 65–99)
GLUCOSE-CAPILLARY: 98 mg/dL (ref 65–99)
GLUCOSE-CAPILLARY: 151 mg/dL — AB (ref 65–99)
GLUCOSE-CAPILLARY: 155 mg/dL — AB (ref 65–99)
Glucose-Capillary: 101 mg/dL — ABNORMAL HIGH (ref 65–99)
Glucose-Capillary: 106 mg/dL — ABNORMAL HIGH (ref 65–99)
Glucose-Capillary: 155 mg/dL — ABNORMAL HIGH (ref 65–99)
Glucose-Capillary: 163 mg/dL — ABNORMAL HIGH (ref 65–99)
Glucose-Capillary: 172 mg/dL — ABNORMAL HIGH (ref 65–99)

## 2015-06-26 LAB — HEPATITIS B SURFACE ANTIBODY, QUANTITATIVE: Hepatitis B-Post: <3.1 m[IU]/mL — ABNORMAL LOW (ref >9.9)

## 2015-06-26 LAB — HEPATITIS C ANTIBODY: HCV Ab: <0.1 s/co ratio (ref 0.0–0.9)

## 2015-06-26 LAB — PREPARE RBC (CROSSMATCH)

## 2015-06-26 LAB — HEPATITIS B CORE ANTIBODY, TOTAL: Hep B Core Total Ab: NEGATIVE

## 2015-06-26 LAB — HEPATITIS B SURFACE ANTIGEN: Hepatitis B Surface Ag: NEGATIVE

## 2015-06-26 MED ORDER — ACETAMINOPHEN 325 MG PO TABS
650.0000 mg | ORAL_TABLET | Freq: Once | ORAL | Status: AC
  Administered 2015-06-26: 650 mg via ORAL
  Filled 2015-06-26: qty 2

## 2015-06-26 MED ORDER — SODIUM CHLORIDE 0.9 % IJ SOLN
10.0000 mL | INTRAMUSCULAR | Status: DC | PRN
  Administered 2015-07-02: 10 mL
  Administered 2015-07-02: 30 mL
  Administered 2015-07-02 – 2015-07-04 (×3): 10 mL
  Filled 2015-06-26 (×5): qty 40

## 2015-06-26 MED ORDER — SODIUM CHLORIDE 0.9 % IJ SOLN
10.0000 mL | Freq: Two times a day (BID) | INTRAMUSCULAR | Status: DC
  Administered 2015-06-26 (×2): 20 mL
  Administered 2015-06-26 – 2015-06-27 (×3): 10 mL
  Administered 2015-06-28: 14:00:00 30 mL
  Administered 2015-06-28 – 2015-06-29 (×3): 10 mL
  Administered 2015-06-30: 20 mL
  Administered 2015-06-30 – 2015-07-02 (×3): 10 mL
  Administered 2015-07-02: 08:00:00 30 mL
  Administered 2015-07-02 – 2015-07-03 (×2): 10 mL
  Administered 2015-07-03: 09:00:00 30 mL
  Administered 2015-07-04 (×2): 10 mL

## 2015-06-26 MED ORDER — SODIUM CHLORIDE 0.9 % IJ SOLN
3.0000 mL | INTRAMUSCULAR | Status: DC | PRN
  Administered 2015-07-05: 3 mL via INTRAVENOUS
  Filled 2015-06-26: qty 10

## 2015-06-26 MED ORDER — SODIUM CHLORIDE 0.9 % IJ SOLN
3.0000 mL | Freq: Two times a day (BID) | INTRAMUSCULAR | Status: DC
  Administered 2015-06-26 – 2015-07-05 (×18): 3 mL via INTRAVENOUS

## 2015-06-26 MED ORDER — SODIUM CHLORIDE 0.9 % IV SOLN
Freq: Once | INTRAVENOUS | Status: AC
  Administered 2015-06-26: 14:00:00 via INTRAVENOUS

## 2015-06-26 MED ORDER — FUROSEMIDE 10 MG/ML IJ SOLN
60.0000 mg | Freq: Once | INTRAMUSCULAR | Status: AC
  Administered 2015-06-26: 60 mg via INTRAVENOUS
  Filled 2015-06-26: qty 6

## 2015-06-26 NOTE — Plan of Care (Signed)
Problem: Nutrition: Goal: Adequate nutrition will be maintained Outcome: Progressing 1. Completed general education with review of pain scale used. No pain prn's required. Oxycodone on scheduled basis given with adequate pain control. 2. No safety issues/injuries this shift. 3. Pain controlled with current pain management plan. 4. Skin fraile with bruises generalized. Methiculous skin care performed to perianal area due to loose stooling. Lomotil given with slight response.  5. Drinks ensure alive with poor appetite to other food choices/provisions.  1 unit PRBC's tranfusing and tolerated well with lasix IV as premed given.

## 2015-06-26 NOTE — Plan of Care (Signed)
Problem: Pain Managment: Goal: General experience of comfort will improve Outcome: Not Progressing Patient with chronic back pain.  Administration of Oxycodone and Dilaudid per eMAR this shift with pain diminished upon reassessment.    Problem: Skin Integrity: Goal: Risk for impaired skin integrity will decrease Outcome: Progressing Patient with foam to sacrum and heels.  Heels floated with pillow this shift.  Bandage change to left foot with wet to dry dressing.  Patient repositioned every two hours throughout shift.  Problem: Nutrition: Goal: Adequate nutrition will be maintained Outcome: Progressing Patient ate 100% of dinner after dialysis this evening.  She is Q2 blood sugar check.  Blood sugars began to trend down early this AM.  Patient given orange juice to raise levels.

## 2015-06-26 NOTE — Progress Notes (Signed)
[facility]  ROUNDING NOTE   Subjective:  Pt had HD yesterday. Remains incontinent. Resting comfortably in bed this AM.  Objective:  Vital signs in last 24 hours:  Temp:  [97.5 F (36.4 C)-98.6 F (37 C)] 98.6 F (37 C) (11/27 0419) Pulse Rate:  [76-105] 91 (11/27 0419) Resp:  [11-22] 22 (11/27 0419) BP: (97-142)/(43-72) 137/59 mmHg (11/27 0419) SpO2:  [95 %-100 %] 100 % (11/27 0419) Weight:  [56.7 kg (125 lb)] 56.7 kg (125 lb) (11/26 1505)  Weight change:  Filed Weights   06/20/15 0700 06/24/15 0458 06/25/15 1505  Weight: 59.7 kg (131 lb 9.8 oz) 61.598 kg (135 lb 12.8 oz) 56.7 kg (125 lb)    Intake/Output: I/O last 3 completed shifts: In: -  Out: 2 [Urine:1; Stool:1]   Intake/Output this shift:     Physical Exam: General: Chronically ill appearing  Head: Healing wound on forehead  E, ENT: Moist oral mucus membranes  Neck: Supple  Lungs:  Bilateral rhonchi, normal effort   Heart: S1S2 no rubs  Abdomen:  Soft, nontender, BS present   Extremities:  trace peripheral edema.  Neurologic: Awake, alert, following commands  Skin: Large forehead scar from prior skin cancer   Left femoral non-tunneled catheter, rectal tube    Basic Metabolic Panel:  Recent Labs Lab 06/19/15 1740 06/19/15 2053 06/20/15 0043 06/20/15 0551 06/21/15 0453 06/22/15 0618 06/23/15 0557 06/24/15 0522 06/25/15 0554 06/25/15 1510  NA 134* 134* 135 135 137 134* 135 138 137  --   K 4.4 4.2 4.1 3.9 4.2 4.1 4.3 4.5 4.3  --   CL 104 103 104 105 105 103 102 105 102  --   CO2 27 29 28 28 29 27 27 28 27   --   GLUCOSE 168* 121* 110* 82 81 100* 81 86 88  --   BUN 10 9 9 8 18  31* 40* 52* 61*  --   CREATININE 0.69 0.61 0.73 0.68 1.73* 2.77* 3.45* 3.83* 3.98*  --   CALCIUM 7.9* 7.8* 7.9* 7.9* 8.0* 7.8* 8.0* 8.0* 8.0*  --   MG 1.5* 2.1 1.8 1.8  --  1.7  --   --   --   --   PHOS 1.9* 2.0* 2.0* 1.7* 2.8 3.0  --   --   --  3.2    Liver Function Tests:  Recent Labs Lab  06/19/15 1343 06/19/15 1740 06/19/15 2053 06/20/15 0043 06/20/15 0551  ALBUMIN 2.1* 2.2* 2.1* 1.9* 1.9*   No results for input(s): LIPASE, AMYLASE in the last 168 hours. No results for input(s): AMMONIA in the last 168 hours.  CBC:  Recent Labs Lab 06/20/15 0551 06/21/15 0453 06/23/15 0557 06/25/15 0554 06/25/15 1510  WBC 15.1* 16.2*  --   --  8.1  HGB 7.5* 7.7* 7.1* 7.6* 6.9*  HCT 25.0* 25.8*  --   --  22.1*  MCV 75.1* 76.8*  --   --  77.9*  PLT 106* 131*  --   --  163    Cardiac Enzymes: No results for input(s): CKTOTAL, CKMB, CKMBINDEX, TROPONINI in the last 168 hours.  BNP: Invalid input(s): POCBNP  CBG:  Recent Labs Lab 06/25/15 2226 06/26/15 0109 06/26/15 0416 06/26/15 0708 06/26/15 0910  GLUCAP 110* 61* 73 101* 146*    Microbiology: Results for orders placed or performed during the hospital encounter of 06/10/15  Urine culture     Status: None   Collection Time: 06/10/15  7:30 PM  Result Value Ref Range  Status   Specimen Description URINE, RANDOM  Final   Special Requests NONE  Final   Culture NO GROWTH 2 DAYS  Final   Report Status 06/12/2015 FINAL  Final  Blood Culture (routine x 2)     Status: None   Collection Time: 06/10/15  8:10 PM  Result Value Ref Range Status   Specimen Description BLOOD RIGHT  Final   Special Requests BOTTLES DRAWN AEROBIC AND ANAEROBIC 5CC  Final   Culture  Setup Time   Final    GRAM POSITIVE COCCI AEROBIC BOTTLE ONLY CRITICAL RESULT CALLED TO, READ BACK BY AND VERIFIED WITH: RN Kathi Simpers 06/14/15 1358    Culture   Final    COAGULASE NEGATIVE STAPHYLOCOCCUS Results consistent with contamination.    Report Status 06/16/2015 FINAL  Final  Blood Culture (routine x 2)     Status: None   Collection Time: 06/10/15  8:20 PM  Result Value Ref Range Status   Specimen Description BLOOD RIGHT  Final   Special Requests BOTTLES DRAWN AEROBIC AND ANAEROBIC 2CC AERO, 4CC  Final   Culture NO GROWTH 5 DAYS  Final    Report Status 06/15/2015 FINAL  Final  MRSA PCR Screening     Status: Abnormal   Collection Time: 06/11/15  1:40 AM  Result Value Ref Range Status   MRSA by PCR POSITIVE (A) NEGATIVE Final    Comment:        The GeneXpert MRSA Assay (FDA approved for NASAL specimens only), is one component of a comprehensive MRSA colonization surveillance program. It is not intended to diagnose MRSA infection nor to guide or monitor treatment for MRSA infections. CRITICAL RESULT CALLED TO, READ BACK BY AND VERIFIED WITH: [facility] ALLEN AT [id number] 06/11/15.PMH   Culture, sputum-assessment     Status: None   Collection Time: 06/11/15  4:30 AM  Result Value Ref Range Status   Specimen Description TRACHEAL ASPIRATE  Final   Special Requests NONE  Final   Sputum evaluation THIS SPECIMEN IS ACCEPTABLE FOR SPUTUM CULTURE  Final   Report Status 06/11/2015 FINAL  Final  Culture, respiratory (NON-Expectorated)     Status: None   Collection Time: 06/11/15  4:30 AM  Result Value Ref Range Status   Specimen Description TRACHEAL ASPIRATE  Final   Special Requests NONE Reflexed from [id number]  Final   Gram Stain   Final    GOOD SPECIMEN - 80-90% WBCS MODERATE WBC SEEN MODERATE GRAM POSITIVE COCCI RARE GRAM NEGATIVE RODS    Culture HEAVY GROWTH STREPTOCOCCUS PNEUMONIAE  Final   Report Status 06/13/2015 FINAL  Final   Organism ID, Bacteria STREPTOCOCCUS PNEUMONIAE  Final      Susceptibility   Streptococcus pneumoniae - MIC*    ERYTHROMYCIN <=0.12 SENSITIVE Sensitive     VANCOMYCIN 0.5 SENSITIVE Sensitive     TRIMETH/SULFA <=10 SENSITIVE Sensitive     CLINDAMYCIN <=0.25 SENSITIVE Sensitive     LEVOFLOXACIN Value in next row Sensitive      SENSITIVE1    LINEZOLID Value in next row Sensitive      SENSITIVE<=2    * HEAVY GROWTH STREPTOCOCCUS PNEUMONIAE  C difficile quick scan w PCR reflex     Status: None   Collection Time: 06/20/15  4:30 AM  Result Value Ref Range Status   C Diff antigen NEGATIVE  NEGATIVE Final   C Diff toxin NEGATIVE NEGATIVE Final   C Diff interpretation Negative for C. difficile  Final    Coagulation Studies: No results  for input(s): LABPROT, INR in the last 72 hours.  Urinalysis: No results for input(s): COLORURINE, LABSPEC, PHURINE, GLUCOSEU, HGBUR, BILIRUBINUR, KETONESUR, PROTEINUR, UROBILINOGEN, NITRITE, LEUKOCYTESUR in the last 72 hours.  Invalid input(s): APPERANCEUR    Imaging: No results found.   Medications:   . dextrose 20 mL/hr at 06/26/15 0921   . sodium chloride   Intravenous Once  . acetaminophen  650 mg Oral Once  . aspirin  81 mg Oral Daily  . budesonide  0.25 mg Nebulization BID  . feeding supplement (ENSURE ENLIVE)  237 mL Oral TID  . furosemide  60 mg Intravenous Once  . ipratropium-albuterol  3 mL Nebulization TID  . oxyCODONE  80 mg Oral Q12H  . predniSONE  10 mg Oral Q breakfast  . sodium chloride  10-40 mL Intracatheter Q12H  . sodium chloride  3 mL Intravenous Q12H   sodium chloride, sodium chloride, acetaminophen **OR** [DISCONTINUED] acetaminophen, alteplase, diazepam, diphenoxylate-atropine, guaiFENesin, heparin, HYDROmorphone, lidocaine (PF), lidocaine-prilocaine, pentafluoroprop-tetrafluoroeth, polyvinyl alcohol, promethazine, senna-docusate, sodium chloride, sodium chloride, zolpidem  Assessment/ Plan:  69 y.o. female  with a PMHx of COPD, hypertension, ischemic cardiomyopathy with multiple coronary artery stents, history of ventricular tachycardia, peripheral vascular disease, hyperlipidemia, chronic back pain, history of congestive heart failure, who was admitted to [facility] on 06/10/2015 for evaluation of respiratory failure.   1. Acute renal failure secondary to acute tubular necrosis. Patient presented with multiorgan failure upon presentation. Baseline creatinine 0.75. - patient had dialysis yesterday.  No acute indication for dialysis today.  Continue to follow renal function parameters..    2. Hyperkalemia.   -  resolved with dialysis.  Continue to monitorserum potassium.    3. Acute respiratory failure.  - remains off the ventilator.  Doing well postextubation.  Continue to monitor respiratory pattern and O2 sats..  4. Acute myocardial infarction.  Appears resolved.  5.  Anemia unspecified: hemoglobin down to 6.9.  Blood transfusion being planned today.  LOS: [place], Lynn Butler 11/27/201610:19 AM

## 2015-06-26 NOTE — Progress Notes (Signed)
I unit PRBC's tranfused and tolerated well. RN checked pt every 30 minutes during transfusion.

## 2015-06-26 NOTE — Progress Notes (Signed)
Patient ID: Lynn Butler, female   DOB: 03/14/1946, 69 y.o.   MRN: [id number] [facility] PROGRESS NOTE  PCP: Juanell Fairly, MD  HPI/Subjective: Patient states that she has weeping edema on her back. Some shortness of breath and cough. Always has some abdominal pain. I informed her that her hemoglobin is 6.9 and I recommended a transfusion. The patient has had multiple transfusions in the past and consented for transfusion.  Objective: Filed Vitals:   06/25/15 2148 06/26/15 0419  BP: 142/62 137/59  Pulse: 105 91  Temp: 98.5 F (36.9 C) 98.6 F (37 C)  Resp: 22 22    Filed Weights   06/20/15 0700 06/24/15 0458 06/25/15 1505  Weight: 59.7 kg (131 lb 9.8 oz) 61.598 kg (135 lb 12.8 oz) 56.7 kg (125 lb)    ROS: Review of Systems  Constitutional: Negative for fever and chills.  Eyes: Negative for blurred vision.  Respiratory: Positive for cough and shortness of breath.   Cardiovascular: Negative for chest pain.  Gastrointestinal: Positive for nausea, abdominal pain and diarrhea. Negative for vomiting and constipation.  Genitourinary: Negative for dysuria.  Musculoskeletal: Positive for back pain. Negative for joint pain.  Neurological: Negative for dizziness and headaches.   Exam: Physical Exam  Constitutional: She is oriented to person, place, and time.  HENT:  Nose: No mucosal edema.  Mouth/Throat: No oropharyngeal exudate or posterior oropharyngeal edema.  Eyes: Conjunctivae, EOM and lids are normal. Pupils are equal, round, and reactive to light.  Neck: No JVD present. Carotid bruit is not present. No edema present. No thyroid mass and no thyromegaly present.  Cardiovascular: S1 normal and S2 normal.  Exam reveals no gallop.   No murmur heard. Pulses:      Dorsalis pedis pulses are 0 on the right side, and 0 on the left side.  Respiratory: No accessory muscle usage. No respiratory distress. She has decreased breath sounds in the right lower field and the  left lower field. She has no wheezes. She has rhonchi in the right lower field and the left lower field. She has no rales.  GI: Soft. Bowel sounds are normal. There is no tenderness.  Musculoskeletal:       Right ankle: She exhibits no swelling.       Left ankle: She exhibits no swelling.  Lymphadenopathy:    She has no cervical adenopathy.  Neurological: She is alert and oriented to person, place, and time. No cranial nerve deficit.  Skin: Skin is warm. Nails show no clubbing.  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 Skin graft on  forehead with scab. Sacral decubiti stage II.  Psychiatric: She has a normal mood and affect.    Data Reviewed: Basic Metabolic Panel:  Recent Labs Lab 06/19/15 1740 06/19/15 2053 06/20/15 0043 06/20/15 0551 06/21/15 0453 06/22/15 0618 06/23/15 0557 06/24/15 0522 06/25/15 0554 06/25/15 1510  NA 134* 134* 135 135 137 134* 135 138 137  --   K 4.4 4.2 4.1 3.9 4.2 4.1 4.3 4.5 4.3  --   CL 104 103 104 105 105 103 102 105 102  --   CO2 27 29 28 28 29 27 27 28 27   --   GLUCOSE 168* 121* 110* 82 81 100* 81 86 88  --   BUN 10 9 9 8 18  31* 40* 52* 61*  --   CREATININE 0.69 0.61 0.73 0.68 1.73* 2.77* 3.45* 3.83* 3.98*  --   CALCIUM 7.9* 7.8* 7.9* 7.9* 8.0* 7.8* 8.0*  8.0* 8.0*  --   MG 1.5* 2.1 1.8 1.8  --  1.7  --   --   --   --   PHOS 1.9* 2.0* 2.0* 1.7* 2.8 3.0  --   --   --  3.2   Liver Function Tests:  Recent Labs Lab 06/19/15 1343 06/19/15 1740 06/19/15 2053 06/20/15 0043 06/20/15 0551  ALBUMIN 2.1* 2.2* 2.1* 1.9* 1.9*   CBC:  Recent Labs Lab 06/20/15 0551 06/21/15 0453 06/23/15 0557 06/25/15 0554 06/25/15 1510  WBC 15.1* 16.2*  --   --  8.1  HGB 7.5* 7.7* 7.1* 7.6* 6.9*  HCT 25.0* 25.8*  --   --  22.1*  MCV 75.1* 76.8*  --   --  77.9*  PLT 106* 131*  --   --  163     Recent Results (from the past 240 hour(s))  C difficile quick scan w PCR reflex     Status: None   Collection Time: 06/20/15  4:30 AM  Result Value  Ref Range Status   C Diff antigen NEGATIVE NEGATIVE Final   C Diff toxin NEGATIVE NEGATIVE Final   C Diff interpretation Negative for C. difficile  Final    Scheduled Meds: . sodium chloride   Intravenous Once  . acetaminophen  650 mg Oral Once  . aspirin  81 mg Oral Daily  . budesonide  0.25 mg Nebulization BID  . feeding supplement (ENSURE ENLIVE)  237 mL Oral TID  . furosemide  60 mg Intravenous Once  . ipratropium-albuterol  3 mL Nebulization TID  . oxyCODONE  80 mg Oral Q12H  . predniSONE  10 mg Oral Q breakfast    Assessment/Plan:  1. Septic shock secondary to pneumonia. Sputum culture grew out Streptococcus pneumoniae. Patient finished treatment with IV Rocephin. 2. Hypoglycemia- patient placed on D5 drip secondary to repeated hypoglycemia. [facility] consult endocrinology on Monday. 3. Acute respiratory failure, COPD exacerbation and pneumonia. Patient extubated 11/21. Patient on chronic oxygen at home. I likely will continue chronic prednisone also. Continue nebulizer treatments. 4. Acute renal failure with oligoria. Nephrology did dialysis yesterday. 5. Acute myocardial infarction NSTEMI- aspirin and medical management. No beta blocker with bronchospasm and end-stage COPD 6. Acute on chronic anemia- hemoglobin 6.9- I will give a unit of packed red blood cells. 7. Chronic pain syndrome on oxycodone and dilaudid and Valium 8. Weakness- nephrology recommended LTAC facility until we figure out whether the patient will need long-term dialysis is not. 9. Diarrhea- rectal tube out 10. Nausea- Phenergan as needed for nausea. 11. As per palliative care, patient considering hospice home but today she told me she is continue to fight to the bitter end.  Code Status:     Code Status Orders        Start     Ordered   06/19/15 1140  Do not attempt resuscitation (DNR)   Continuous    Question Answer Comment  In the event of cardiac or respiratory ARREST Do not call a "code blue"   In  the event of cardiac or respiratory ARREST Do not perform Intubation, CPR, defibrillation or ACLS   In the event of cardiac or respiratory ARREST Use medication by any route, position, wound care, and other measures to relive pain and suffering. May use oxygen, suction and manual treatment of airway obstruction as needed for comfort.      06/19/15 1140      Disposition Plan: I will have care manager look into whether the patient  is a candidate for LTAC facility or not.  Consultants:  Critical care specialist  Nephrology  Palliative care team  Time spent: 20 minutes, left message for her daughter.  Loletha Grayer  [facility] [place] [facility]

## 2015-06-27 LAB — GLUCOSE, CAPILLARY
GLUCOSE-CAPILLARY: 98 mg/dL (ref 65–99)
GLUCOSE-CAPILLARY: 104 mg/dL — AB (ref 65–99)
GLUCOSE-CAPILLARY: 52 mg/dL — AB (ref 65–99)
GLUCOSE-CAPILLARY: 131 mg/dL — AB (ref 65–99)
GLUCOSE-CAPILLARY: 168 mg/dL — AB (ref 65–99)
GLUCOSE-CAPILLARY: 90 mg/dL (ref 65–99)
GLUCOSE-CAPILLARY: 109 mg/dL — AB (ref 65–99)
GLUCOSE-CAPILLARY: 115 mg/dL — AB (ref 65–99)
GLUCOSE-CAPILLARY: 173 mg/dL — AB (ref 65–99)
GLUCOSE-CAPILLARY: 107 mg/dL — AB (ref 65–99)
Glucose-Capillary: 149 mg/dL — ABNORMAL HIGH (ref 65–99)
Glucose-Capillary: 67 mg/dL (ref 65–99)
Glucose-Capillary: 86 mg/dL (ref 65–99)

## 2015-06-27 LAB — BASIC METABOLIC PANEL
Anion gap: 5 (ref 5–15)
BUN: 44 mg/dL — AB (ref 6–20)
CHLORIDE: 100 mmol/L — AB (ref 101–111)
CO2: 34 mmol/L — AB (ref 22–32)
Calcium: 7.6 mg/dL — ABNORMAL LOW (ref 8.9–10.3)
Creatinine, Ser: 2.41 mg/dL — ABNORMAL HIGH (ref 0.44–1.00)
GFR calc Af Amer: 23 mL/min — ABNORMAL LOW (ref >60)
GFR calc non Af Amer: 20 mL/min — ABNORMAL LOW (ref >60)
Glucose, Bld: 86 mg/dL (ref 65–99)
POTASSIUM: 3.7 mmol/L (ref 3.5–5.1)
SODIUM: 139 mmol/L (ref 135–145)

## 2015-06-27 LAB — TYPE AND SCREEN
ABO/RH(D): O POS
Antibody Screen: NEGATIVE
Unit division: 0

## 2015-06-27 LAB — HEMOGLOBIN: Hemoglobin: 7.8 g/dL — ABNORMAL LOW (ref 12.0–16.0)

## 2015-06-27 LAB — CORTISOL: Cortisol, Plasma: 12.9 ug/dL

## 2015-06-27 MED ORDER — ATORVASTATIN CALCIUM 20 MG PO TABS
40.0000 mg | ORAL_TABLET | Freq: Every day | ORAL | Status: DC
  Administered 2015-06-27 – 2015-07-04 (×8): 40 mg via ORAL
  Filled 2015-06-27 (×8): qty 2

## 2015-06-27 MED ORDER — DEXTROSE 50 % IV SOLN
1.0000 | Freq: Once | INTRAVENOUS | Status: AC
  Administered 2015-06-27: 06:00:00 50 mL via INTRAVENOUS

## 2015-06-27 MED ORDER — DEXTROSE 50 % IV SOLN
INTRAVENOUS | Status: AC
  Filled 2015-06-27: qty 50

## 2015-06-27 MED ORDER — FUROSEMIDE 10 MG/ML IJ SOLN
40.0000 mg | Freq: Once | INTRAMUSCULAR | Status: AC
  Administered 2015-06-27: 11:00:00 40 mg via INTRAVENOUS
  Filled 2015-06-27: qty 4

## 2015-06-27 NOTE — Progress Notes (Signed)
Patient ID: Lynn Butler, female   DOB: 1946-03-11, 69 y.o.   MRN: [id number] [facility] PROGRESS NOTE  PCP: Juanell Fairly, MD  HPI/Subjective: Patient stating that she is having more trouble breathing last couple days. With cough and some shortness of breath. She was has abdominal pain. 9 chronic back pain. She is also having a tremor and concerned about that.  Objective: Filed Vitals:   06/27/15 0457 06/27/15 1306  BP: 127/70 114/47  Pulse: 85 89  Temp: 98.3 F (36.8 C) 98.6 F (37 C)  Resp: 18 18    Filed Weights   06/20/15 0700 06/24/15 0458 06/25/15 1505  Weight: 59.7 kg (131 lb 9.8 oz) 61.598 kg (135 lb 12.8 oz) 56.7 kg (125 lb)    ROS: Review of Systems  Constitutional: Negative for fever and chills.  Eyes: Negative for blurred vision.  Respiratory: Positive for cough and shortness of breath.   Cardiovascular: Negative for chest pain.  Gastrointestinal: Positive for nausea, abdominal pain and diarrhea. Negative for vomiting and constipation.  Genitourinary: Negative for dysuria.  Musculoskeletal: Positive for back pain. Negative for joint pain.  Neurological: Positive for tremors. Negative for dizziness and headaches.   Exam: Physical Exam  Constitutional: She is oriented to person, place, and time.  HENT:  Nose: No mucosal edema.  Mouth/Throat: No oropharyngeal exudate or posterior oropharyngeal edema.  Eyes: Conjunctivae, EOM and lids are normal. Pupils are equal, round, and reactive to light.  Neck: No JVD present. Carotid bruit is not present. No edema present. No thyroid mass and no thyromegaly present.  Cardiovascular: S1 normal and S2 normal.  Exam reveals no gallop.   No murmur heard. Pulses:      Dorsalis pedis pulses are 0 on the right side, and 0 on the left side.  Respiratory: No accessory muscle usage. No respiratory distress. She has decreased breath sounds in the right lower field and the left lower field. She has no wheezes. She has  rhonchi in the right lower field and the left lower field. She has no rales.  GI: Soft. Bowel sounds are normal. There is no tenderness.  Musculoskeletal:       Right ankle: She exhibits no swelling.       Left ankle: She exhibits no swelling.  Lymphadenopathy:    She has no cervical adenopathy.  Neurological: She is alert and oriented to person, place, and time. No cranial nerve deficit.  Skin: Skin is warm. Nails show no clubbing.  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 Skin graft on  forehead with scab. Sacral decubiti stage II.  Psychiatric: She has a normal mood and affect.    Data Reviewed: Basic Metabolic Panel:  Recent Labs Lab 06/21/15 0453 06/22/15 0618 06/23/15 0557 06/24/15 0522 06/25/15 0554 06/25/15 1510 06/27/15 0456  NA 137 134* 135 138 137  --  139  K 4.2 4.1 4.3 4.5 4.3  --  3.7  CL 105 103 102 105 102  --  100*  CO2 29 27 27 28 27   --  34*  GLUCOSE 81 100* 81 86 88  --  86  BUN 18 31* 40* 52* 61*  --  44*  CREATININE 1.73* 2.77* 3.45* 3.83* 3.98*  --  2.41*  CALCIUM 8.0* 7.8* 8.0* 8.0* 8.0*  --  7.6*  MG  --  1.7  --   --   --   --   --   PHOS 2.8 3.0  --   --   --  3.2  --    I just CBC:  Recent Labs Lab 06/21/15 0453 06/23/15 0557 06/25/15 0554 06/25/15 1510 06/27/15 0456  WBC 16.2*  --   --  8.1  --   HGB 7.7* 7.1* 7.6* 6.9* 7.8*  HCT 25.8*  --   --  22.1*  --   MCV 76.8*  --   --  77.9*  --   PLT 131*  --   --  163  --      Recent Results (from the past 240 hour(s))  C difficile quick scan w PCR reflex     Status: None   Collection Time: 06/20/15  4:30 AM  Result Value Ref Range Status   C Diff antigen NEGATIVE NEGATIVE Final   C Diff toxin NEGATIVE NEGATIVE Final   C Diff interpretation Negative for C. difficile  Final    Scheduled Meds: . aspirin  81 mg Oral Daily  . atorvastatin  40 mg Oral q1800  . budesonide  0.25 mg Nebulization BID  . feeding supplement (ENSURE ENLIVE)  237 mL Oral TID  . ipratropium-albuterol   3 mL Nebulization TID  . oxyCODONE  80 mg Oral Q12H  . predniSONE  10 mg Oral Q breakfast  . sodium chloride  10-40 mL Intracatheter Q12H  . sodium chloride  3 mL Intravenous Q12H    Assessment/Plan:  1. Septic shock secondary to pneumonia. Sputum culture grew out Streptococcus pneumoniae. Patient finished treatment with IV Rocephin. 2. Hypoglycemia- decrease rate of D5 drip secondary to repeated hypoglycemia. Awaiting a.m. cortisol. May have to switch over to hydrocortisone instead prednisone 3. Acute respiratory failure, COPD exacerbation and pneumonia. Patient extubated 11/21. Patient on chronic oxygen at home. I likely will continue chronic prednisone also. Continue nebulizer treatments. 4. Acute renal failure with oligoria. Nephrology did dialysis Saturday. Creatinine actually improved today to 2.41. Will likely need to watch creatinine the next couple days to see if this improves. 5. Acute myocardial infarction NSTEMI- aspirin, statin and medical management. No beta blocker with bronchospasm and end-stage COPD 6. Acute on chronic anemia- hemoglobin 7.8- good response to packed red blood cells yesterday 7. Chronic pain syndrome on oxycodone and dilaudid and Valium 8. Weakness- Will likely need long-term care 9. Diarrhea- rectal tube out 10. Nausea- Phenergan as needed for nausea.  Code Status:     Code Status Orders        Start     Ordered   06/19/15 1140  Do not attempt resuscitation (DNR)   Continuous    Question Answer Comment  In the event of cardiac or respiratory ARREST Do not call a "code blue"   In the event of cardiac or respiratory ARREST Do not perform Intubation, CPR, defibrillation or ACLS   In the event of cardiac or respiratory ARREST Use medication by any route, position, wound care, and other measures to relive pain and suffering. May use oxygen, suction and manual treatment of airway obstruction as needed for comfort.      06/19/15 1140      Disposition  Plan: Will need long-term care   Consultants:  Critical care specialist  Nephrology  Palliative care team  Time spent: 20 minutes,  Spoke with daughter yesterday   Loletha Grayer  [facility]

## 2015-06-27 NOTE — Plan of Care (Signed)
Problem: Safety: Goal: Ability to remain free from injury will improve Outcome: Progressing Pt is high falls, bed alarm in use, call bell within reach, hourly safety rounds  Problem: Pain Managment: Goal: General experience of comfort will improve Outcome: Progressing Pt has c/o lower back pain, prn meds given with improvement, pt resting comfortably in bed  Problem: Skin Integrity: Goal: Risk for impaired skin integrity will decrease Outcome: Progressing Skin care given, dressing changed on wound, q 2 hr position change  Problem: Nutrition: Goal: Adequate nutrition will be maintained Outcome: Progressing Pt has had good PO intake this shift

## 2015-06-27 NOTE — Progress Notes (Signed)
Palliative Medicine Inpatient Consult Follow Up Note   Name: Lynn Butler Date: 06/27/2015 MRN: [id number]  DOB: 05-Nov-1945  Referring Physician: Loletha Grayer, MD  Palliative Care consult requested for this 69 y.o. female for goals of medical therapy in patient with multi-organ failure. She is well known to me from a previous admission not too long ago. She had lived with her ex-husband (and his girlfriend) --renting a small room in his trailer, and then most recently, she had gone to live again with her daughter, Lynn Butler, just prior to needing to come back to the hospital. She has actually been very chronically ill for a number of years and was a patient in [place] about 5 years ago. Not too long ago, she had an LTAC stay at [facility]. While there, she had a large left forehead skin graft placed. The scab from this is shrinking since its appearance during her last admission here. She has been in and out of skilled facilities, LTAC, and living with her daughter or her ex. She is currently still borderliine for needing hemodialysis again/ ongoing (last done on Saturday) and she has a temporary HD catheter in place.   TODAY'S DISCUSSIONS AND DECISIONS:  Pt has decided to 'fight till the end'.  She was pretty sure she wanted [facility] a few days ago --and now has changed her mind again.  This fits with her past choices. She may go back and forth again.    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 MRSA colonization left foot wound Chronic severe back pain on high dose pain Rxs H/O V Tach H/O cardiac pacer placement (?and removal?) and defibrillator placement Severe Malnutrition due to chronic illness and loss of appetite Insomnia Anxiety  REVIEW OF SYSTEMS:  Patient is not able to provide ROS due to weakness  CODE STATUS: DNR   PAST MEDICAL HISTORY: Past Medical History  Diagnosis Date  . COPD (chronic obstructive pulmonary disease) ([place])   . Hypertension   . Ischemic cardiomyopathy     a.   . Coronary artery disease     a. s/p multiple stenting in 2005  . History of ventricular tachycardia   . PVD (peripheral vascular disease) ([place])   . PAD (peripheral artery disease) ([place]   . HLD (hyperlipidemia)   . Chronic back pain   . MI, old   . MRSA (methicillin resistant staph aureus) culture positive     left foot wound  . CHF (congestive heart failure) ([place])   . Pneumonia   . Asthma   . Cancer [facility])     PAST SURGICAL HISTORY:  Past Surgical History  Procedure Laterality Date  . Insert / replace / remove pacemaker    . Vascular bypass surgery Bilateral   . Cardiac catheterization N/A 01/14/2015    Procedure: Left Heart Cath;  Surgeon: Mertie Clause  Fletcher Anon, MD;  Location: [facility];  Service: Cardiovascular;  Laterality: N/A;  . Cardiac defibrillator placement    . Appendectomy    . Cesarean section    . Abdominal surgery      Vital Signs: BP 127/70 mmHg  Pulse 85  Temp(Src) 98.3 F (36.8 C) (Oral)  Resp 18  Ht 5\' 2"  (1.575 m)  Wt 56.7 kg (125 lb)  BMI 22.86 kg/m2  SpO2 100% Filed Weights   06/20/15 0700 06/24/15 0458 06/25/15 1505  Weight: 59.7 kg (131 lb 9.8 oz) 61.598 kg (135 lb  12.8 oz) 56.7 kg (125 lb)    Estimated body mass index is 22.86 kg/(m^2) as calculated from the following:   Height as of this encounter: 5\' 2"  (1.575 m).   Weight as of this encounter: 56.7 kg (125 lb).  PHYSICAL EXAM: NAD but weak EOMI Scab on forehead where skin graft was placed (scab is shrinking) OP clear  Heart rrr no mgr Lungs cta ant Abd soft and NT Ext no cyanosis or mottling   LABS: CBC:    Component Value Date/Time   WBC 8.1 06/25/2015 1510   WBC 6.9 11/22/2014 0748   HGB 7.8* 06/27/2015 0456   HGB 10.3* 11/22/2014 0748   HCT 22.1* 06/25/2015 1510   HCT 32.9* 11/22/2014 0748   PLT 163 06/25/2015 1510   PLT 189 11/26/2014 0428   MCV 77.9* 06/25/2015 1510   MCV 72* 11/22/2014 0748   NEUTROABS 22.7* 06/13/2015 2057   NEUTROABS 4.6 11/22/2014 0748   LYMPHSABS 0.6* 06/13/2015 2057   LYMPHSABS 1.4 11/22/2014 0748   MONOABS 1.2* 06/13/2015 2057   MONOABS 0.8 11/22/2014 0748   EOSABS 0.0 06/13/2015 2057   EOSABS 0.0 11/22/2014 0748   BASOSABS 0.0 06/13/2015 2057   BASOSABS 0.0 11/22/2014 0748   Comprehensive Metabolic Panel:    Component Value Date/Time   NA 139 06/27/2015 0456   NA 139 11/25/2014 0518   K 3.7 06/27/2015 0456   K 3.8 11/25/2014 0518   CL 100* 06/27/2015 0456   CL 103 11/25/2014 0518   CO2 34* 06/27/2015 0456   CO2 31 11/25/2014 0518   BUN 44* 06/27/2015 0456   BUN 25* 11/25/2014 0518   CREATININE 2.41* 06/27/2015 0456   CREATININE 0.66 11/25/2014 0518   GLUCOSE 86 06/27/2015 0456   GLUCOSE 83 11/25/2014 0518   CALCIUM 7.6* 06/27/2015 0456   CALCIUM 8.0* 11/25/2014 0518   AST 78* 06/16/2015 0520   AST 23 11/15/2014 1819   ALT 407* 06/16/2015 0520   ALT 14 11/15/2014 1819   ALKPHOS 147* 06/16/2015 0520   ALKPHOS 109 11/15/2014 1819   BILITOT 0.6 06/16/2015 0520   BILITOT 0.6 11/15/2014 1819   PROT 4.8* 06/16/2015 0520   PROT 7.1 11/15/2014 1819   ALBUMIN 1.9* 06/20/2015 0551   ALBUMIN 3.3* 11/15/2014 1819     More than 50%  of the visit was spent in counseling/coordination of care: YES  Time Spent: 35 min

## 2015-06-27 NOTE — Care Management Important Message (Signed)
Important Message  Patient Details  Name: Lynn Butler MRN: [id number] Date of Birth: 02-09-1946   Medicare Important Message Given:  Yes    Shelbie Ammons, RN 06/27/2015, 11:07 AM

## 2015-06-27 NOTE — Progress Notes (Signed)
[facility]  ROUNDING NOTE   Subjective:  Urine output was 1 L over the past 24 hours. Creatinine currently down to 2.41. However patient had dialysis over the weekend.   Objective:  Vital signs in last 24 hours:  Temp:  [98.3 F (36.8 C)-99.3 F (37.4 C)] 98.3 F (36.8 C) (11/28 0457) Pulse Rate:  [81-85] 85 (11/28 0457) Resp:  [18-20] 18 (11/28 0457) BP: (116-132)/(46-70) 127/70 mmHg (11/28 0457) SpO2:  [99 %-100 %] 100 % (11/28 0457)  Weight change:  Filed Weights   06/20/15 0700 06/24/15 0458 06/25/15 1505  Weight: 59.7 kg (131 lb 9.8 oz) 61.598 kg (135 lb 12.8 oz) 56.7 kg (125 lb)    Intake/Output: I/O last 3 completed shifts: In: 1299 [P.O.:954; I.V.:45; Blood:300] Out: 1002 [Urine:1000; Stool:2]   Intake/Output this shift:     Physical Exam: General: Chronically ill appearing  Head: Healing wound on forehead  E, ENT: Moist oral mucus membranes  Neck: Supple  Lungs:  Bilateral rhonchi, normal effort   Heart: S1S2 no rubs  Abdomen:  Soft, nontender, BS present   Extremities:  trace peripheral edema.  Neurologic: Awake, alert, following commands  Skin: Large forehead scar from prior skin cancer   Left femoral non-tunneled catheter, rectal tube    Basic Metabolic Panel:  Recent Labs Lab 06/21/15 0453 06/22/15 0618 06/23/15 0557 06/24/15 0522 06/25/15 0554 06/25/15 1510 06/27/15 0456  NA 137 134* 135 138 137  --  139  K 4.2 4.1 4.3 4.5 4.3  --  3.7  CL 105 103 102 105 102  --  100*  CO2 29 27 27 28 27   --  34*  GLUCOSE 81 100* 81 86 88  --  86  BUN 18 31* 40* 52* 61*  --  44*  CREATININE 1.73* 2.77* 3.45* 3.83* 3.98*  --  2.41*  CALCIUM 8.0* 7.8* 8.0* 8.0* 8.0*  --  7.6*  MG  --  1.7  --   --   --   --   --   PHOS 2.8 3.0  --   --   --  3.2  --     Liver Function Tests: No results for input(s): AST, ALT, ALKPHOS, BILITOT, PROT, ALBUMIN in the last 168 hours. No results for input(s): LIPASE, AMYLASE in the last 168 hours. No  results for input(s): AMMONIA in the last 168 hours.  CBC:  Recent Labs Lab 06/21/15 0453 06/23/15 0557 06/25/15 0554 06/25/15 1510 06/27/15 0456  WBC 16.2*  --   --  8.1  --   HGB 7.7* 7.1* 7.6* 6.9* 7.8*  HCT 25.8*  --   --  22.1*  --   MCV 76.8*  --   --  77.9*  --   PLT 131*  --   --  163  --     Cardiac Enzymes: No results for input(s): CKTOTAL, CKMB, CKMBINDEX, TROPONINI in the last 168 hours.  BNP: Invalid input(s): POCBNP  CBG:  Recent Labs Lab 06/27/15 0500 06/27/15 0551 06/27/15 0643 06/27/15 0726 06/27/15 1049  GLUCAP 67 52* 168* 131* 26    Microbiology: Results for orders placed or performed during the hospital encounter of 06/10/15  Urine culture     Status: None   Collection Time: 06/10/15  7:30 PM  Result Value Ref Range Status   Specimen Description URINE, RANDOM  Final   Special Requests NONE  Final   Culture NO GROWTH 2 DAYS  Final   Report Status 06/12/2015 FINAL  Final  Blood Culture (routine x 2)     Status: None   Collection Time: 06/10/15  8:10 PM  Result Value Ref Range Status   Specimen Description BLOOD RIGHT  Final   Special Requests BOTTLES DRAWN AEROBIC AND ANAEROBIC 5CC  Final   Culture  Setup Time   Final    GRAM POSITIVE COCCI AEROBIC BOTTLE ONLY CRITICAL RESULT CALLED TO, READ BACK BY AND VERIFIED WITH: RN Kathi Simpers 06/14/15 1358    Culture   Final    COAGULASE NEGATIVE STAPHYLOCOCCUS Results consistent with contamination.    Report Status 06/16/2015 FINAL  Final  Blood Culture (routine x 2)     Status: None   Collection Time: 06/10/15  8:20 PM  Result Value Ref Range Status   Specimen Description BLOOD RIGHT  Final   Special Requests BOTTLES DRAWN AEROBIC AND ANAEROBIC 2CC AERO, 4CC  Final   Culture NO GROWTH 5 DAYS  Final   Report Status 06/15/2015 FINAL  Final  MRSA PCR Screening     Status: Abnormal   Collection Time: 06/11/15  1:40 AM  Result Value Ref Range Status   MRSA by PCR POSITIVE (A)  NEGATIVE Final    Comment:        The GeneXpert MRSA Assay (FDA approved for NASAL specimens only), is one component of a comprehensive MRSA colonization surveillance program. It is not intended to diagnose MRSA infection nor to guide or monitor treatment for MRSA infections. CRITICAL RESULT CALLED TO, READ BACK BY AND VERIFIED WITH: [facility] ALLEN AT [id number] 06/11/15.PMH   Culture, sputum-assessment     Status: None   Collection Time: 06/11/15  4:30 AM  Result Value Ref Range Status   Specimen Description TRACHEAL ASPIRATE  Final   Special Requests NONE  Final   Sputum evaluation THIS SPECIMEN IS ACCEPTABLE FOR SPUTUM CULTURE  Final   Report Status 06/11/2015 FINAL  Final  Culture, respiratory (NON-Expectorated)     Status: None   Collection Time: 06/11/15  4:30 AM  Result Value Ref Range Status   Specimen Description TRACHEAL ASPIRATE  Final   Special Requests NONE Reflexed from [id number]  Final   Gram Stain   Final    GOOD SPECIMEN - 80-90% WBCS MODERATE WBC SEEN MODERATE GRAM POSITIVE COCCI RARE GRAM NEGATIVE RODS    Culture HEAVY GROWTH STREPTOCOCCUS PNEUMONIAE  Final   Report Status 06/13/2015 FINAL  Final   Organism ID, Bacteria STREPTOCOCCUS PNEUMONIAE  Final      Susceptibility   Streptococcus pneumoniae - MIC*    ERYTHROMYCIN <=0.12 SENSITIVE Sensitive     VANCOMYCIN 0.5 SENSITIVE Sensitive     TRIMETH/SULFA <=10 SENSITIVE Sensitive     CLINDAMYCIN <=0.25 SENSITIVE Sensitive     LEVOFLOXACIN Value in next row Sensitive      SENSITIVE1    LINEZOLID Value in next row Sensitive      SENSITIVE<=2    * HEAVY GROWTH STREPTOCOCCUS PNEUMONIAE  C difficile quick scan w PCR reflex     Status: None   Collection Time: 06/20/15  4:30 AM  Result Value Ref Range Status   C Diff antigen NEGATIVE NEGATIVE Final   C Diff toxin NEGATIVE NEGATIVE Final   C Diff interpretation Negative for C. difficile  Final    Coagulation Studies: No results for input(s): LABPROT, INR in the  last 72 hours.  Urinalysis: No results for input(s): COLORURINE, LABSPEC, PHURINE, GLUCOSEU, HGBUR, BILIRUBINUR, KETONESUR, PROTEINUR, UROBILINOGEN, NITRITE, LEUKOCYTESUR in the last 72 hours.  Invalid input(s): APPERANCEUR    Imaging: No results found.   Medications:   . dextrose 10 mL/hr at 06/27/15 1109   . aspirin  81 mg Oral Daily  . budesonide  0.25 mg Nebulization BID  . feeding supplement (ENSURE ENLIVE)  237 mL Oral TID  . ipratropium-albuterol  3 mL Nebulization TID  . oxyCODONE  80 mg Oral Q12H  . predniSONE  10 mg Oral Q breakfast  . sodium chloride  10-40 mL Intracatheter Q12H  . sodium chloride  3 mL Intravenous Q12H   sodium chloride, sodium chloride, acetaminophen **OR** [DISCONTINUED] acetaminophen, alteplase, diazepam, diphenoxylate-atropine, guaiFENesin, heparin, HYDROmorphone, lidocaine (PF), lidocaine-prilocaine, pentafluoroprop-tetrafluoroeth, polyvinyl alcohol, promethazine, senna-docusate, sodium chloride, sodium chloride, zolpidem  Assessment/ Plan:  69 y.o. female  with a PMHx of COPD, hypertension, ischemic cardiomyopathy with multiple coronary artery stents, history of ventricular tachycardia, peripheral vascular disease, hyperlipidemia, chronic back pain, history of congestive heart failure, who was admitted to [facility] on 06/10/2015 for evaluation of respiratory failure.   1. Acute renal failure secondary to acute tubular necrosis. Patient presented with multiorgan failure upon presentation. Baseline creatinine 0.75. - patient was previously on continuous renal replacement therapy.  She had dialysis over the weekend.  Creatinine currently down to 2.4 and urine output was 1 L over the past 24 hours.  Therefore we will hold dialysis at this point in time and follow creatinine and urine output trend.    2. Hyperkalemia.   - potassium normal today at3.7.  3. Acute respiratory failure.  - resolved at this time, patient doing well off the ventilator.   Breathing comfortably.  4. Acute myocardial infarction.  Appears resolved.  5.  Anemia unspecified: hemoglobin up to 7.8 post transfusion.  Continue to monitor CBC.  LOS: [place], Crytal Pensinger 11/28/201612:51 PM

## 2015-06-27 NOTE — Plan of Care (Signed)
Problem: Safety: Goal: Ability to remain free from injury will improve Outcome: Progressing Pt remains free from falls.  Calls with needs.  Bed in lowest position, call bell and phone within reach.     Problem: Pain Managment: Goal: General experience of comfort will improve Outcome: Progressing Pt states having extreme pain between 7-10/10.  Pain meds given with short window of relief.    Problem: Skin Integrity: Goal: Risk for impaired skin integrity will decrease Outcome: Progressing Pt turns self in bed, but does allow elevation of heels.

## 2015-06-28 ENCOUNTER — Inpatient Hospital Stay: Payer: Medicare Other

## 2015-06-28 LAB — BLOOD GAS, ARTERIAL
ACID-BASE EXCESS: 3.4 mmol/L — AB (ref 0.0–3.0)
ALLENS TEST (PASS/FAIL): POSITIVE — AB
Bicarbonate: 31.9 mEq/L — ABNORMAL HIGH (ref 21.0–28.0)
FIO2: 0.36
O2 SAT: 88 %
PATIENT TEMPERATURE: 37
PCO2 ART: 71 mmHg — AB (ref 32.0–48.0)
PO2 ART: 63 mmHg — AB (ref 83.0–108.0)
pH, Arterial: 7.26 — ABNORMAL LOW (ref 7.350–7.450)

## 2015-06-28 LAB — GLUCOSE, CAPILLARY
GLUCOSE-CAPILLARY: 114 mg/dL — AB (ref 65–99)
GLUCOSE-CAPILLARY: 87 mg/dL (ref 65–99)
GLUCOSE-CAPILLARY: 114 mg/dL — AB (ref 65–99)
Glucose-Capillary: 113 mg/dL — ABNORMAL HIGH (ref 65–99)
Glucose-Capillary: 108 mg/dL — ABNORMAL HIGH (ref 65–99)
Glucose-Capillary: 69 mg/dL (ref 65–99)
Glucose-Capillary: 67 mg/dL (ref 65–99)

## 2015-06-28 LAB — BASIC METABOLIC PANEL
ANION GAP: 11 (ref 5–15)
BUN: 53 mg/dL — AB (ref 6–20)
CHLORIDE: 97 mmol/L — AB (ref 101–111)
CO2: 32 mmol/L (ref 22–32)
Calcium: 7.8 mg/dL — ABNORMAL LOW (ref 8.9–10.3)
Creatinine, Ser: 2.82 mg/dL — ABNORMAL HIGH (ref 0.44–1.00)
GFR calc Af Amer: 19 mL/min — ABNORMAL LOW (ref >60)
GFR calc non Af Amer: 16 mL/min — ABNORMAL LOW (ref >60)
GLUCOSE: 112 mg/dL — AB (ref 65–99)
POTASSIUM: 3.3 mmol/L — AB (ref 3.5–5.1)
Sodium: 140 mmol/L (ref 135–145)

## 2015-06-28 LAB — URINALYSIS COMPLETE WITH MICROSCOPIC (ARMC ONLY)
BACTERIA UA: NONE SEEN
BILIRUBIN URINE: NEGATIVE
GLUCOSE, UA: NEGATIVE mg/dL
Hgb urine dipstick: NEGATIVE
KETONES UR: NEGATIVE mg/dL
LEUKOCYTES UA: NEGATIVE
Nitrite: NEGATIVE
PH: 7 (ref 5.0–8.0)
Protein, ur: NEGATIVE mg/dL
SQUAMOUS EPITHELIAL / LPF: NONE SEEN
Specific Gravity, Urine: 1.005 (ref 1.005–1.030)

## 2015-06-28 MED ORDER — MORPHINE SULFATE (PF) 2 MG/ML IV SOLN
2.0000 mg | INTRAVENOUS | Status: DC | PRN
  Administered 2015-06-28 – 2015-07-03 (×8): 2 mg via INTRAVENOUS
  Filled 2015-06-28 (×9): qty 1

## 2015-06-28 MED ORDER — GLYCOPYRROLATE 0.2 MG/ML IJ SOLN
0.2000 mg | INTRAMUSCULAR | Status: DC | PRN
  Administered 2015-06-28: 18:00:00 0.2 mg via INTRAVENOUS
  Filled 2015-06-28: qty 1

## 2015-06-28 MED ORDER — FUROSEMIDE 10 MG/ML IJ SOLN
INTRAMUSCULAR | Status: AC
  Filled 2015-06-28: qty 4

## 2015-06-28 MED ORDER — DILTIAZEM HCL 25 MG/5ML IV SOLN
10.0000 mg | Freq: Once | INTRAVENOUS | Status: AC
  Administered 2015-06-28: 10 mg via INTRAVENOUS
  Filled 2015-06-28: qty 5

## 2015-06-28 MED ORDER — FUROSEMIDE 40 MG PO TABS
80.0000 mg | ORAL_TABLET | Freq: Every day | ORAL | Status: DC
  Administered 2015-06-29 – 2015-06-30 (×2): 80 mg via ORAL
  Filled 2015-06-28 (×2): qty 2

## 2015-06-28 MED ORDER — HYDROCORTISONE 10 MG PO TABS
20.0000 mg | ORAL_TABLET | Freq: Two times a day (BID) | ORAL | Status: DC
  Administered 2015-06-28 – 2015-06-30 (×4): 20 mg via ORAL
  Filled 2015-06-28 (×2): qty 2
  Filled 2015-06-28 (×3): qty 1

## 2015-06-28 MED ORDER — FUROSEMIDE 10 MG/ML IJ SOLN
40.0000 mg | Freq: Once | INTRAMUSCULAR | Status: AC
  Administered 2015-06-28: 05:00:00 40 mg via INTRAVENOUS

## 2015-06-28 NOTE — Progress Notes (Signed)
Pre-hd tx 

## 2015-06-28 NOTE — Progress Notes (Signed)
HD tx start 

## 2015-06-28 NOTE — Progress Notes (Signed)
Post hd tx 

## 2015-06-28 NOTE — Progress Notes (Signed)
HD tx completed.

## 2015-06-28 NOTE — Progress Notes (Signed)
PT Cancellation Note  Patient Details Name: Lynn Butler MRN: [id number] DOB: January 25, 1946   Cancelled Treatment:    Reason Eval/Treat Not Completed: Patient at procedure or test/unavailable (Pt off floor at dialysis.)  Will re-attempt PT session at a later date/time.   [name] 06/28/2015, 10:36 AM [name], [place]

## 2015-06-28 NOTE — Progress Notes (Signed)
[facility]  ROUNDING NOTE   Subjective:  Worsening of status noted.  Increasing shortness of breath of noted.  Tachycardia noted as well. Low sats as well. Was given lasix.    Objective:  Vital signs in last 24 hours:  Temp:  [98.6 F (37 C)] 98.6 F (37 C) (11/28 1306) Pulse Rate:  [87-149] 124 (11/29 0602) Resp:  [18-36] 28 (11/29 0602) BP: (114-169)/(47-102) 124/61 mmHg (11/29 0602) SpO2:  [91 %-100 %] 99 % (11/29 0602) FiO2 (%):  [50 %] 50 % (11/29 0548)  Weight change:  Filed Weights   06/20/15 0700 06/24/15 0458 06/25/15 1505  Weight: 59.7 kg (131 lb 9.8 oz) 61.598 kg (135 lb 12.8 oz) 56.7 kg (125 lb)    Intake/Output: I/O last 3 completed shifts: In: 480 [P.O.:480] Out: 950 [Urine:950]   Intake/Output this shift:     Physical Exam: General: Chronically ill appearing  Head: Healing wound on forehead  E, ENT: Moist oral mucus membranes  Neck: Supple  Lungs:  Bilateral rhonchi, increased work of breathing  Heart: S1S2 no rubs  Abdomen:  Soft, nontender, BS present   Extremities:  trace peripheral edema.  Neurologic: Awake, alert, following commands  Skin: Large forehead scar from prior skin cancer   Left femoral non-tunneled catheter, rectal tube    Basic Metabolic Panel:  Recent Labs Lab 06/22/15 0618 06/23/15 0557 06/24/15 0522 06/25/15 0554 06/25/15 1510 06/27/15 0456  NA 134* 135 138 137  --  139  K 4.1 4.3 4.5 4.3  --  3.7  CL 103 102 105 102  --  100*  CO2 27 27 28 27   --  34*  GLUCOSE 100* 81 86 88  --  86  BUN 31* 40* 52* 61*  --  44*  CREATININE 2.77* 3.45* 3.83* 3.98*  --  2.41*  CALCIUM 7.8* 8.0* 8.0* 8.0*  --  7.6*  MG 1.7  --   --   --   --   --   PHOS 3.0  --   --   --  3.2  --     Liver Function Tests: No results for input(s): AST, ALT, ALKPHOS, BILITOT, PROT, ALBUMIN in the last 168 hours. No results for input(s): LIPASE, AMYLASE in the last 168 hours. No results for input(s): AMMONIA in the last 168  hours.  CBC:  Recent Labs Lab 06/23/15 0557 06/25/15 0554 06/25/15 1510 06/27/15 0456  WBC  --   --  8.1  --   HGB 7.1* 7.6* 6.9* 7.8*  HCT  --   --  22.1*  --   MCV  --   --  77.9*  --   PLT  --   --  163  --     Cardiac Enzymes: No results for input(s): CKTOTAL, CKMB, CKMBINDEX, TROPONINI in the last 168 hours.  BNP: Invalid input(s): POCBNP  CBG:  Recent Labs Lab 06/27/15 1947 06/27/15 2139 06/27/15 2348 06/28/15 0156 06/28/15 0356  GLUCAP 149* 107* 86 114* 20    Microbiology: Results for orders placed or performed during the hospital encounter of 06/10/15  Urine culture     Status: None   Collection Time: 06/10/15  7:30 PM  Result Value Ref Range Status   Specimen Description URINE, RANDOM  Final   Special Requests NONE  Final   Culture NO GROWTH 2 DAYS  Final   Report Status 06/12/2015 FINAL  Final  Blood Culture (routine x 2)     Status: None  Collection Time: 06/10/15  8:10 PM  Result Value Ref Range Status   Specimen Description BLOOD RIGHT  Final   Special Requests BOTTLES DRAWN AEROBIC AND ANAEROBIC 5CC  Final   Culture  Setup Time   Final    GRAM POSITIVE COCCI AEROBIC BOTTLE ONLY CRITICAL RESULT CALLED TO, READ BACK BY AND VERIFIED WITH: RN Kathi Simpers 06/14/15 1358    Culture   Final    COAGULASE NEGATIVE STAPHYLOCOCCUS Results consistent with contamination.    Report Status 06/16/2015 FINAL  Final  Blood Culture (routine x 2)     Status: None   Collection Time: 06/10/15  8:20 PM  Result Value Ref Range Status   Specimen Description BLOOD RIGHT  Final   Special Requests BOTTLES DRAWN AEROBIC AND ANAEROBIC 2CC AERO, 4CC  Final   Culture NO GROWTH 5 DAYS  Final   Report Status 06/15/2015 FINAL  Final  MRSA PCR Screening     Status: Abnormal   Collection Time: 06/11/15  1:40 AM  Result Value Ref Range Status   MRSA by PCR POSITIVE (A) NEGATIVE Final    Comment:        The GeneXpert MRSA Assay (FDA approved for NASAL  specimens only), is one component of a comprehensive MRSA colonization surveillance program. It is not intended to diagnose MRSA infection nor to guide or monitor treatment for MRSA infections. CRITICAL RESULT CALLED TO, READ BACK BY AND VERIFIED WITH: [facility] ALLEN AT [id number] 06/11/15.PMH   Culture, sputum-assessment     Status: None   Collection Time: 06/11/15  4:30 AM  Result Value Ref Range Status   Specimen Description TRACHEAL ASPIRATE  Final   Special Requests NONE  Final   Sputum evaluation THIS SPECIMEN IS ACCEPTABLE FOR SPUTUM CULTURE  Final   Report Status 06/11/2015 FINAL  Final  Culture, respiratory (NON-Expectorated)     Status: None   Collection Time: 06/11/15  4:30 AM  Result Value Ref Range Status   Specimen Description TRACHEAL ASPIRATE  Final   Special Requests NONE Reflexed from [id number]  Final   Gram Stain   Final    GOOD SPECIMEN - 80-90% WBCS MODERATE WBC SEEN MODERATE GRAM POSITIVE COCCI RARE GRAM NEGATIVE RODS    Culture HEAVY GROWTH STREPTOCOCCUS PNEUMONIAE  Final   Report Status 06/13/2015 FINAL  Final   Organism ID, Bacteria STREPTOCOCCUS PNEUMONIAE  Final      Susceptibility   Streptococcus pneumoniae - MIC*    ERYTHROMYCIN <=0.12 SENSITIVE Sensitive     VANCOMYCIN 0.5 SENSITIVE Sensitive     TRIMETH/SULFA <=10 SENSITIVE Sensitive     CLINDAMYCIN <=0.25 SENSITIVE Sensitive     LEVOFLOXACIN Value in next row Sensitive      SENSITIVE1    LINEZOLID Value in next row Sensitive      SENSITIVE<=2    * HEAVY GROWTH STREPTOCOCCUS PNEUMONIAE  C difficile quick scan w PCR reflex     Status: None   Collection Time: 06/20/15  4:30 AM  Result Value Ref Range Status   C Diff antigen NEGATIVE NEGATIVE Final   C Diff toxin NEGATIVE NEGATIVE Final   C Diff interpretation Negative for C. difficile  Final    Coagulation Studies: No results for input(s): LABPROT, INR in the last 72 hours.  Urinalysis:  Recent Labs  06/28/15 0527  COLORURINE YELLOW*   LABSPEC 1.005  PHURINE 7.0  GLUCOSEU NEGATIVE  HGBUR NEGATIVE  BILIRUBINUR NEGATIVE  KETONESUR NEGATIVE  PROTEINUR NEGATIVE  NITRITE NEGATIVE  LEUKOCYTESUR NEGATIVE  Imaging: Dg Chest Port 1 View  06/28/2015  CLINICAL DATA:  Dyspnea.  Altered mental status. EXAM: PORTABLE CHEST 1 VIEW COMPARISON:  06/20/2015 FINDINGS: There is unchanged cardiomegaly. There are intact appearances of the transvenous cardiac leads. Mild central vascular prominence and interstitial thickening have worsened from 06/20/2015. This likely represents a degree of congestive heart failure. No confluent airspace opacity. No large effusions. IMPRESSION: Mild congestive heart failure. Electronically Signed   By: Andreas Newport M.D.   On: 06/28/2015 06:53     Medications:   . dextrose 10 mL/hr at 06/27/15 1109   . aspirin  81 mg Oral Daily  . atorvastatin  40 mg Oral q1800  . budesonide  0.25 mg Nebulization BID  . feeding supplement (ENSURE ENLIVE)  237 mL Oral TID  . furosemide      . ipratropium-albuterol  3 mL Nebulization TID  . oxyCODONE  80 mg Oral Q12H  . predniSONE  10 mg Oral Q breakfast  . sodium chloride  10-40 mL Intracatheter Q12H  . sodium chloride  3 mL Intravenous Q12H   sodium chloride, sodium chloride, acetaminophen **OR** [DISCONTINUED] acetaminophen, alteplase, diazepam, diphenoxylate-atropine, guaiFENesin, heparin, HYDROmorphone, lidocaine (PF), lidocaine-prilocaine, morphine injection, pentafluoroprop-tetrafluoroeth, polyvinyl alcohol, promethazine, senna-docusate, sodium chloride, sodium chloride, zolpidem  Assessment/ Plan:  69 y.o. female  with a PMHx of COPD, hypertension, ischemic cardiomyopathy with multiple coronary artery stents, history of ventricular tachycardia, peripheral vascular disease, hyperlipidemia, chronic back pain, history of congestive heart failure, who was admitted to [facility] on 06/10/2015 for evaluation of respiratory failure.   1. Acute renal failure  secondary to acute tubular necrosis. Patient presented with multiorgan failure upon presentation. Baseline creatinine 0.75. - Pt seen at bedside, worsening respiratory status noted.  Mild CHF pattern noted on Xray.  Will plan for HD today with UF target of 1.5kg, will reevaluate for need of HD again tomorrow.    2. Hyperkalemia.   -- resolved with HD.  3. Acute respiratory failure.  - worsening respiratory status noted at present, DNR status noted.  Was on bipap earlier.  Will attempt HD to see if we can treat underlying mild CHF pattern.  4. Acute myocardial infarction.  Appears resolved.  5.  Anemia unspecified: last hgb 7.8, has been transfused this admission..  LOS: 18 Lynn Butler 11/29/20167:02 AM

## 2015-06-28 NOTE — Progress Notes (Signed)
Patient ID: Lynn Butler, female   DOB: [birth year], 69 y.o.   MRN: [id number] [facility] PROGRESS NOTE  PCP: Juanell Fairly, MD  HPI/Subjective: Patient went into for fluid overload overnight. Patient refused BiPAP. Does not want intubation. Is scheduled to go down to dialysis today. Positive for shortness of breath. Positive for abdominal pain. Positive for diarrhea.  Objective: Filed Vitals:   06/28/15 0548 06/28/15 0602  BP: 135/82 124/61  Pulse: 143 124  Temp:    Resp: 36 28    Filed Weights   06/20/15 0700 06/24/15 0458 06/25/15 1505  Weight: 59.7 kg (131 lb 9.8 oz) 61.598 kg (135 lb 12.8 oz) 56.7 kg (125 lb)    ROS: Review of Systems  Constitutional: Negative for fever and chills.  Eyes: Negative for blurred vision.  Respiratory: Positive for cough and shortness of breath.   Cardiovascular: Negative for chest pain.  Gastrointestinal: Positive for nausea, abdominal pain and diarrhea. Negative for vomiting and constipation.  Genitourinary: Negative for dysuria.  Musculoskeletal: Positive for back pain. Negative for joint pain.  Neurological: Positive for tremors. Negative for dizziness and headaches.   Exam: Physical Exam  Constitutional: She is oriented to person, place, and time.  HENT:  Nose: No mucosal edema.  Mouth/Throat: No oropharyngeal exudate or posterior oropharyngeal edema.  Eyes: Conjunctivae, EOM and lids are normal. Pupils are equal, round, and reactive to light.  Neck: No JVD present. Carotid bruit is not present. No edema present. No thyroid mass and no thyromegaly present.  Cardiovascular: S1 normal and S2 normal.  Exam reveals no gallop.   No murmur heard. Pulses:      Dorsalis pedis pulses are 0 on the right side, and 0 on the left side.  Respiratory: Accessory muscle usage present. No respiratory distress. She has decreased breath sounds in the right middle field, the right lower field, the left middle field and the left lower field.  She has wheezes in the right upper field. She has no rhonchi. She has rales in the right middle field, the right lower field, the left middle field and the left lower field.  GI: Soft. Bowel sounds are normal. There is no tenderness.  Musculoskeletal:       Right ankle: She exhibits no swelling.       Left ankle: She exhibits no swelling.  Lymphadenopathy:    She has no cervical adenopathy.  Neurological: She is alert and oriented to person, place, and time. No cranial nerve deficit.  Skin: Skin is warm. Nails show no clubbing.  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 Skin graft on  forehead with scab. Sacral decubiti stage II.  Psychiatric: She has a normal mood and affect.    Data Reviewed: Basic Metabolic Panel:  Recent Labs Lab 06/22/15 0618 06/23/15 0557 06/24/15 0522 06/25/15 0554 06/25/15 1510 06/27/15 0456  NA 134* 135 138 137  --  139  K 4.1 4.3 4.5 4.3  --  3.7  CL 103 102 105 102  --  100*  CO2 27 27 28 27   --  34*  GLUCOSE 100* 81 86 88  --  86  BUN 31* 40* 52* 61*  --  44*  CREATININE 2.77* 3.45* 3.83* 3.98*  --  2.41*  CALCIUM 7.8* 8.0* 8.0* 8.0*  --  7.6*  MG 1.7  --   --   --   --   --   PHOS 3.0  --   --   --  3.2  --  I just CBC:  Recent Labs Lab 06/23/15 0557 06/25/15 0554 06/25/15 1510 06/27/15 0456  WBC  --   --  8.1  --   HGB 7.1* 7.6* 6.9* 7.8*  HCT  --   --  22.1*  --   MCV  --   --  77.9*  --   PLT  --   --  163  --      Recent Results (from the past 240 hour(s))  C difficile quick scan w PCR reflex     Status: None   Collection Time: 06/20/15  4:30 AM  Result Value Ref Range Status   C Diff antigen NEGATIVE NEGATIVE Final   C Diff toxin NEGATIVE NEGATIVE Final   C Diff interpretation Negative for C. difficile  Final    Scheduled Meds: . aspirin  81 mg Oral Daily  . atorvastatin  40 mg Oral q1800  . budesonide  0.25 mg Nebulization BID  . feeding supplement (ENSURE ENLIVE)  237 mL Oral TID  . furosemide      . [START  ON 06/29/2015] furosemide  80 mg Oral Daily  . hydrocortisone  20 mg Oral BID WC  . ipratropium-albuterol  3 mL Nebulization TID  . oxyCODONE  80 mg Oral Q12H  . sodium chloride  10-40 mL Intracatheter Q12H  . sodium chloride  3 mL Intravenous Q12H    Assessment/Plan:  1. Acute fluid overload. Acute systolic congestive heart failure. Patient was given Lasix 40 mg once and will be brought down to dialysis today. No beta blocker with bronchospasm. No ACE inhibitor with borderline renal function. 2. Septic shock secondary to pneumonia. Sputum culture grew out Streptococcus pneumoniae. Patient finished treatment with IV Rocephin. 3. Hypoglycemia- discontinue D5 drip. Start hydrocortisone 20 mg twice a day for suspected adrenal insufficiency. 4. Acute respiratory failure, COPD exacerbation and pneumonia. And now heart failure. Patient does not want further intubation or BiPAP. Patient extubated 11/21. Patient on chronic oxygen at home. Continue nebulizer treatments. 5. Acute renal failure. Nephrology to do dialysis today to manage fluid. Creatinine yesterday actually improved. Labs pending from today. 6. Acute myocardial infarction NSTEMI- aspirin, statin and medical management. No beta blocker with bronchospasm and end-stage COPD 7. Acute on chronic anemia- hemoglobin 7.8 yesterday 8. Chronic pain syndrome on oxycodone and dilaudid and Valium 9. Weakness- Will likely need long-term care 10. Diarrhea- rectal tube out. Asked nurse to give medication to slow diarrhea down 11. Nausea- Phenergan as needed for nausea.  Code Status:     Code Status Orders        Start     Ordered   06/19/15 1140  Do not attempt resuscitation (DNR)   Continuous    Question Answer Comment  In the event of cardiac or respiratory ARREST Do not call a "code blue"   In the event of cardiac or respiratory ARREST Do not perform Intubation, CPR, defibrillation or ACLS   In the event of cardiac or respiratory ARREST Use  medication by any route, position, wound care, and other measures to relive pain and suffering. May use oxygen, suction and manual treatment of airway obstruction as needed for comfort.      06/19/15 1140      Disposition Plan: Will need long-term care   Consultants:  Critical care specialist  Nephrology  Palliative care team  Time spent: 30 minutes,  Spoke with daughter today   Loletha Grayer  [facility]

## 2015-06-28 NOTE — Clinical Social Work Note (Addendum)
Clinical Social Worker is continuing to follow. Pt may need SNF at discharge or residential hospice. CSW will continue to follow.   [name], MSW, LCSW Clinical Social Worker  [phone]  Addendum: CSW spoke with [facility]. RNCM is pursuing LTAC as well.

## 2015-06-28 NOTE — Progress Notes (Signed)
Patients daughter called with update. No changes in respiratory status. Resting comfortably at this time. Patients daughter would like to be updated if any changes in patient status. Madlyn Frankel, RN

## 2015-06-28 NOTE — Progress Notes (Signed)
Pt. With increased work of breathing, tachypnea and respiratory distress. ABG drawn per Dr. Estanislado Pandy showed hypercarbia. Pt. Placed on BIPAP. Will continue to follow

## 2015-06-28 NOTE — Plan of Care (Signed)
Problem: Safety: Goal: Ability to remain free from injury will improve Outcome: Progressing Pt remains free from falls.  Pt calls frequently to be put on the bedpan.  Bed alarm on, call bell and phone within reach, bed in lowest position.       Problem: Pain Managment: Goal: General experience of comfort will improve Outcome: Progressing Pt having difficulty resting tonight, complains of abdominal pain and is very anxious.  She has had ambien, valium, dilaudid, oxycodone and phernergan.    Problem: Skin Integrity: Goal: Risk for impaired skin integrity will decrease Outcome: Progressing Continued dressing change on left foot.  Heels elevated off bed.

## 2015-06-28 NOTE — Progress Notes (Signed)
Called by RN taking care of patient that Pt is short of breath and congested. Pt seen and evaluated by me.Pt is dyspneic and congested.IV lasix 40 mg given and ABG checked.ABG revealed elevated CO2 level.Pt put on BIPAP. Patient felt better and is DNR by code status. Does not want intubation or be moved to ICU unit.IV morphine as needed for distress from air hunger.Pt felt better and less short of breath after lasix was given.Later on refused bipap and wants oxygen via nasal canula only.

## 2015-06-28 NOTE — Progress Notes (Signed)
PT Cancellation Note  Patient Details Name: Randilyn Krigbaum MRN: [id number] DOB: 08-Sep-1945   Cancelled Treatment:    Reason Eval/Treat Not Completed: Other (comment). Pt just returned from dialysis and unable to stay awake to perform therapy. Unable to perform grip strength at this time. Will re-attempt next available date.   Yovana Scogin 06/28/2015, 4:04 PM  Greggory Stallion, PT, DPT [phone]

## 2015-06-28 NOTE — Care Management Note (Signed)
I have started the admission packet for outpatient dialysis if it is required at discharge.  I will continue to monitor daily for progress.  Iran Sizer  Dialysis Liaison  [phone]

## 2015-06-29 LAB — GLUCOSE, CAPILLARY
GLUCOSE-CAPILLARY: 56 mg/dL — AB (ref 65–99)
GLUCOSE-CAPILLARY: 70 mg/dL (ref 65–99)
GLUCOSE-CAPILLARY: 56 mg/dL — AB (ref 65–99)
GLUCOSE-CAPILLARY: 116 mg/dL — AB (ref 65–99)
GLUCOSE-CAPILLARY: 200 mg/dL — AB (ref 65–99)
GLUCOSE-CAPILLARY: 151 mg/dL — AB (ref 65–99)
GLUCOSE-CAPILLARY: 148 mg/dL — AB (ref 65–99)
GLUCOSE-CAPILLARY: 134 mg/dL — AB (ref 65–99)
GLUCOSE-CAPILLARY: 132 mg/dL — AB (ref 65–99)
Glucose-Capillary: 110 mg/dL — ABNORMAL HIGH (ref 65–99)
Glucose-Capillary: 156 mg/dL — ABNORMAL HIGH (ref 65–99)
Glucose-Capillary: 167 mg/dL — ABNORMAL HIGH (ref 65–99)
Glucose-Capillary: 238 mg/dL — ABNORMAL HIGH (ref 65–99)
Glucose-Capillary: 52 mg/dL — ABNORMAL LOW (ref 65–99)
Glucose-Capillary: 64 mg/dL — ABNORMAL LOW (ref 65–99)
Glucose-Capillary: 94 mg/dL (ref 65–99)

## 2015-06-29 LAB — BASIC METABOLIC PANEL
ANION GAP: 8 (ref 5–15)
BUN: 27 mg/dL — ABNORMAL HIGH (ref 6–20)
CHLORIDE: 98 mmol/L — AB (ref 101–111)
CO2: 35 mmol/L — AB (ref 22–32)
Calcium: 8 mg/dL — ABNORMAL LOW (ref 8.9–10.3)
Creatinine, Ser: 1.67 mg/dL — ABNORMAL HIGH (ref 0.44–1.00)
GFR calc Af Amer: 35 mL/min — ABNORMAL LOW (ref >60)
GFR calc non Af Amer: 30 mL/min — ABNORMAL LOW (ref >60)
GLUCOSE: 96 mg/dL (ref 65–99)
POTASSIUM: 3 mmol/L — AB (ref 3.5–5.1)
Sodium: 141 mmol/L (ref 135–145)

## 2015-06-29 LAB — C DIFFICILE QUICK SCREEN W PCR REFLEX
C Diff antigen: NEGATIVE
C Diff toxin: NEGATIVE

## 2015-06-29 MED ORDER — GLUCOSE 4 G PO CHEW
3.0000 | CHEWABLE_TABLET | Freq: Once | ORAL | Status: AC
  Administered 2015-06-29: 04:00:00 12 g via ORAL

## 2015-06-29 MED ORDER — POTASSIUM CHLORIDE CRYS ER 20 MEQ PO TBCR
40.0000 meq | EXTENDED_RELEASE_TABLET | Freq: Once | ORAL | Status: AC
  Administered 2015-06-29: 40 meq via ORAL
  Filled 2015-06-29: qty 2

## 2015-06-29 NOTE — Progress Notes (Signed)
Patient ID: Lynn Butler, female   DOB: 12/30/1945, 69 y.o.   MRN: [id number] [facility] PROGRESS NOTE  PCP: Juanell Fairly, MD  HPI/Subjective: Patient feeling better than yesterday. She is breathing better after dialysis. Some cough and some shortness of breath persists. Still having some diarrhea and abdominal pain and nausea. Low sugar again this morning and drinking orange juice  Objective: Filed Vitals:   06/28/15 2105 06/29/15 0514  BP: 111/51 123/59  Pulse: 91 100  Temp: 98.8 F (37.1 C) 98.9 F (37.2 C)  Resp: 18 20    Filed Weights   06/25/15 1505 06/28/15 0945 06/28/15 1330  Weight: 56.7 kg (125 lb) 50.4 kg (111 lb 1.8 oz) 48.9 kg (107 lb 12.9 oz)    ROS: Review of Systems  Constitutional: Negative for fever and chills.  Eyes: Negative for blurred vision.  Respiratory: Positive for cough and shortness of breath.   Cardiovascular: Negative for chest pain.  Gastrointestinal: Positive for nausea, abdominal pain and diarrhea. Negative for vomiting and constipation.  Genitourinary: Negative for dysuria.  Musculoskeletal: Positive for back pain. Negative for joint pain.  Neurological: Positive for tremors. Negative for dizziness and headaches.   Exam: Physical Exam  Constitutional: She is oriented to person, place, and time.  HENT:  Nose: No mucosal edema.  Mouth/Throat: No oropharyngeal exudate or posterior oropharyngeal edema.  Eyes: Conjunctivae, EOM and lids are normal. Pupils are equal, round, and reactive to light.  Neck: No JVD present. Carotid bruit is not present. No edema present. No thyroid mass and no thyromegaly present.  Cardiovascular: S1 normal and S2 normal.  Exam reveals no gallop.   No murmur heard. Pulses:      Dorsalis pedis pulses are 0 on the right side, and 0 on the left side.  Respiratory: No accessory muscle usage. No respiratory distress. She has decreased breath sounds in the right lower field and the left lower field. She  has wheezes in the right lower field. She has no rhonchi. She has rales in the right lower field and the left lower field.  GI: Soft. Bowel sounds are normal. There is no tenderness.  Musculoskeletal:       Right ankle: She exhibits no swelling.       Left ankle: She exhibits no swelling.  Lymphadenopathy:    She has no cervical adenopathy.  Neurological: She is alert and oriented to person, place, and time. No cranial nerve deficit.  Skin: Skin is warm. Nails show no clubbing.  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 Skin graft on  forehead with scab. Sacral decubiti stage II.  Psychiatric: She has a normal mood and affect.    Data Reviewed: Basic Metabolic Panel:  Recent Labs Lab 06/24/15 0522 06/25/15 0554 06/25/15 1510 06/27/15 0456 06/28/15 0826 06/29/15 0429  NA 138 137  --  139 140 141  K 4.5 4.3  --  3.7 3.3* 3.0*  CL 105 102  --  100* 97* 98*  CO2 28 27  --  34* 32 35*  GLUCOSE 86 88  --  86 112* 96  BUN 52* 61*  --  44* 53* 27*  CREATININE 3.83* 3.98*  --  2.41* 2.82* 1.67*  CALCIUM 8.0* 8.0*  --  7.6* 7.8* 8.0*  PHOS  --   --  3.2  --   --   --    I just CBC:  Recent Labs Lab 06/23/15 0557 06/25/15 0554 06/25/15 1510 06/27/15 0456  WBC  --   --  8.1  --   HGB 7.1* 7.6* 6.9* 7.8*  HCT  --   --  22.1*  --   MCV  --   --  77.9*  --   PLT  --   --  163  --      Recent Results (from the past 240 hour(s))  C difficile quick scan w PCR reflex     Status: None   Collection Time: 06/20/15  4:30 AM  Result Value Ref Range Status   C Diff antigen NEGATIVE NEGATIVE Final   C Diff toxin NEGATIVE NEGATIVE Final   C Diff interpretation Negative for C. difficile  Final    Scheduled Meds: . aspirin  81 mg Oral Daily  . atorvastatin  40 mg Oral q1800  . budesonide  0.25 mg Nebulization BID  . feeding supplement (ENSURE ENLIVE)  237 mL Oral TID  . furosemide  80 mg Oral Daily  . hydrocortisone  20 mg Oral BID WC  . ipratropium-albuterol  3 mL  Nebulization TID  . oxyCODONE  80 mg Oral Q12H  . potassium chloride  40 mEq Oral Once  . sodium chloride  10-40 mL Intracatheter Q12H  . sodium chloride  3 mL Intravenous Q12H    Assessment/Plan:  1. Acute fluid overload. Acute systolic congestive heart failure. Improved after dialysis yesterday. No beta blocker with bronchospasm. No ACE inhibitor with borderline renal function. Oral Lasix 80 mg daily ordered 2. Septic shock secondary to pneumonia. Sputum culture grew out Streptococcus pneumoniae. Patient finished treatment with IV Rocephin. 3. Hypoglycemia- repeated episodes. We'll get an endocrine consult. Possible adrenal insufficiency with numerous episodes of steroids. Hydrocortisone 20 mg twice daily ordered. 4. Acute respiratory failure, COPD exacerbation and pneumonia and now heart failure. Patient does not want further intubation or BiPAP. Patient extubated 11/21. Patient on chronic oxygen at home. Continue nebulizer treatments. 5. Acute renal failure. Nephrology did dialysis yesterday for fluid overload. Creatinine improved today but that is secondary to dialysis. 6. Acute myocardial infarction NSTEMI- aspirin, statin and medical management. No beta blocker with bronchospasm and end-stage COPD 7. Acute on chronic anemia- hemoglobin 7.8 8. Chronic pain syndrome on oxycodone and dilaudid and Valium 9. Weakness- Will likely need long-term care 10. Diarrhea- since his been 7 days since the last C. difficile I will send again. 11. Nausea- Phenergan as needed for nausea.  Code Status:     Code Status Orders        Start     Ordered   06/19/15 1140  Do not attempt resuscitation (DNR)   Continuous    Question Answer Comment  In the event of cardiac or respiratory ARREST Do not call a "code blue"   In the event of cardiac or respiratory ARREST Do not perform Intubation, CPR, defibrillation or ACLS   In the event of cardiac or respiratory ARREST Use medication by any route,  position, wound care, and other measures to relive pain and suffering. May use oxygen, suction and manual treatment of airway obstruction as needed for comfort.      06/19/15 1140      Disposition Plan: Will need long-term care   Consultants:  Critical care specialist  Nephrology  Palliative care team  Time spent: 20 minutes  Loletha Grayer  [facility]

## 2015-06-29 NOTE — Progress Notes (Signed)
Nutrition Follow-up     INTERVENTION:  Meals and snacks: Cater to pt preferences Medical Nutrition Supplement Therapy: continue ensure for added nutrition   NUTRITION DIAGNOSIS:   Inadequate oral intake related to inability to eat as evidenced by NPO status, improving    GOAL:   Patient will meet greater than or equal to 90% of their needs    MONITOR:    (Energy Intake, Pulmonary Profile, Anthropometrics, Gastrointestinal Profile, Electrolyte and renal Profile)  REASON FOR ASSESSMENT:   Ventilator    ASSESSMENT:    HD yesterday and planning for tomorrow as well   Current Nutrition: 50% of meals per I and O sheet.  Drinking some of ensure per pt.  CNA reports pt storing ensure in top draw in room for daughter.     Gastrointestinal Profile: Last BM: 11/30   Scheduled Medications:  . aspirin  81 mg Oral Daily  . atorvastatin  40 mg Oral q1800  . budesonide  0.25 mg Nebulization BID  . feeding supplement (ENSURE ENLIVE)  237 mL Oral TID  . furosemide  80 mg Oral Daily  . hydrocortisone  20 mg Oral BID WC  . ipratropium-albuterol  3 mL Nebulization TID  . oxyCODONE  80 mg Oral Q12H  . sodium chloride  10-40 mL Intracatheter Q12H  . sodium chloride  3 mL Intravenous Q12H       Electrolyte/Renal Profile and Glucose Profile:   Recent Labs Lab 06/25/15 1510 06/27/15 0456 06/28/15 0826 06/29/15 0429  NA  --  139 140 141  K  --  3.7 3.3* 3.0*  CL  --  100* 97* 98*  CO2  --  34* 32 35*  BUN  --  44* 53* 27*  CREATININE  --  2.41* 2.82* 1.67*  CALCIUM  --  7.6* 7.8* 8.0*  PHOS 3.2  --   --   --   GLUCOSE  --  86 112* 96       Weight Trend since Admission: Filed Weights   06/25/15 1505 06/28/15 0945 06/28/15 1330  Weight: 125 lb (56.7 kg) 111 lb 1.8 oz (50.4 kg) 107 lb 12.9 oz (48.9 kg)       Diet Order:  Diet regular Room service appropriate?: Yes; Fluid consistency:: Thin  Skin:   stage I pressure ulcer, deep tissue injury noted as  well   Height:   Ht Readings from Last 1 Encounters:  06/11/15 5\' 2"  (1.575 m)    Weight:   Wt Readings from Last 1 Encounters:  06/28/15 107 lb 12.9 oz (48.9 kg)    Ideal Body Weight:     BMI:  Body mass index is 19.71 kg/(m^2).  Estimated Nutritional Needs:   Kcal:  BEE 1083 kcals (If 1.1-1.3, AF 1.2)1429-1689 kcals/d.   Protein:  (1.5-2.0 g/kg) 90-120 g/d  Fluid:  (25-58ml/kg) 1500-1821ml/d  EDUCATION NEEDS:   Education needs no appropriate at this time  LOW Care Level  Kanai Hilger B. Zenia Resides, [place], [place] (pager)

## 2015-06-29 NOTE — Progress Notes (Signed)
Palliative Medicine Inpatient Consult Follow Up Note   Name: Lynn Butler Date: 06/29/2015 MRN: [id number]  DOB: [birth year]  Referring Physician: Loletha Grayer, MD  Palliative Care consult requested for this 69 y.o. female for goals of medical therapy in patient with with multi-organ failure. She is well known to me from a previous admission not too long ago. She had lived with her ex-husband (and his girlfriend) --renting a small room in his trailer, and then most recently, she had gone to live again with her daughter, [name], just prior to needing to come back to the hospital. She has actually been very chronically ill for a number of years and was a patient in [place] about 5 years ago. Not too long ago, she had an LTAC stay at [facility]. While there, she had a large left forehead skin graft placed. The scab from this is shrinking since its appearance during her last admission here. She has been in and out of skilled facilities, LTAC, and living with her daughter or her ex. She went into pulmonary edema again and is again getting dialysis.    DISCUSSIONS AND DECISIONS: Pt continues with DNR and DNI status.  She also doesn't like or want BIPAP (and she is hypercarbic often).  She got dialysis and has decided that she will take dialysis if she needs it.  Cr is down to 2.4 and urine output is up so she may not need regular HD--may not need it again BUT does have tendency to have acute systolic CHF due to EF [id number].   I saw daughter in room and we talked about long term solution for her living arrangements.    Pt has both Medicare and Medicaid.  She should probably go to [facility] if they will take her for rehab (I believe she has about 77 rehab days left).  THEN, perhaps she could transition over to a long term bed there under Medicaid. At that point, she could have HOSPICE come in to the facility and follow her there. Mrs Meshell likes this idea.    I let her know I would see her again in a  couple of days .    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 Acute on chronic hypoxic and hypercapneic respiratory failure --intubated and ventilated at admission COPD --oxygen dependent at home Ongoing tobacco smoking (has never and will never quit she says) Shock --treated with aggressive fluid resuscitation Pneumonia Elevated Liver Enzymes Elevated troponin Lactic Acidosis HTN Ischemic Cardiomyopathy  ---with EF 15-25% as seen on last echo in July of this year Acute on Chronic Systolic CHF  ---not a candidate for ACEI due to renal disease/ failure CAD ---severe multivessel disease PVD and PAD Vascular insufficiency foot ulcers  Sacral stage 1 ulcer ---present on admission Anemia of chronic disease 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 COPD (chronic obstructive pulmonary disease) ([place])   . Hypertension   . Ischemic cardiomyopathy     a.   . Coronary artery disease     a. s/p multiple stenting in 2005  .  History of ventricular tachycardia   . PVD (peripheral vascular disease) ([place])   . PAD (peripheral artery disease) ([place])   . HLD (hyperlipidemia)   . Chronic back pain   . MI, old   . MRSA (methicillin resistant staph aureus) culture positive     left foot wound  . CHF (congestive heart failure) ([place])   . Pneumonia   . Asthma   . Cancer [facility])     PAST SURGICAL HISTORY:  Past Surgical History  Procedure Laterality Date  . Insert / replace / remove pacemaker    . Vascular bypass surgery  Bilateral   . Cardiac catheterization N/A 01/14/2015    Procedure: Left Heart Cath;  Surgeon: Wellington Hampshire, MD;  Location: [place] CV LAB;  Service: Cardiovascular;  Laterality: N/A;  . Cardiac defibrillator placement    . Appendectomy    . Cesarean section    . Abdominal surgery      Vital Signs: BP 91/60 mmHg  Pulse 84  Temp(Src) 98.9 F (37.2 C) (Oral)  Resp 22  Ht 5\' 2"  (1.575 m)  Wt 48.9 kg (107 lb 12.9 oz)  BMI 19.71 kg/m2  SpO2 100% Filed Weights   06/25/15 1505 06/28/15 0945 06/28/15 1330  Weight: 56.7 kg (125 lb) 50.4 kg (111 lb 1.8 oz) 48.9 kg (107 lb 12.9 oz)    Estimated body mass index is 19.71 kg/(m^2) as calculated from the following:   Height as of this encounter: 5\' 2"  (1.575 m).   Weight as of this encounter: 48.9 kg (107 lb 12.9 oz).  PHYSICAL EXAM: Weak appearing in medical bed--but looks a little better than 2 days ago Dinner has arrived (lots of selections including french fries AND A LARGE PEPSI.  EOMI Temporal wasting noted bilaterally Nares patent OP clear NO JVD or TM Hrt rrr no m Lungs rales in bases Abd soft and NT No cyanosis no mottling Has skin graft scab on forehead and stage 2 sacral ulcer.   LABS: CBC:    Component Value Date/Time   WBC 8.1 06/25/2015 1510   WBC 6.9 11/22/2014 0748   HGB 7.8* 06/27/2015 0456   HGB 10.3* 11/22/2014 0748   HCT 22.1* 06/25/2015 1510   HCT 32.9* 11/22/2014 0748   PLT 163 06/25/2015 1510   PLT 189 11/26/2014 0428   MCV 77.9* 06/25/2015 1510   MCV 72* 11/22/2014 0748   NEUTROABS 22.7* 06/13/2015 2057   NEUTROABS 4.6 11/22/2014 0748   LYMPHSABS 0.6* 06/13/2015 2057   LYMPHSABS 1.4 11/22/2014 0748   MONOABS 1.2* 06/13/2015 2057   MONOABS 0.8 11/22/2014 0748   EOSABS 0.0 06/13/2015 2057   EOSABS 0.0 11/22/2014 0748   BASOSABS 0.0 06/13/2015 2057   BASOSABS 0.0 11/22/2014 0748   Comprehensive Metabolic Panel:    Component Value Date/Time   NA 141 06/29/2015 0429   NA 139 11/25/2014  0518   K 3.0* 06/29/2015 0429   K 3.8 11/25/2014 0518   CL 98* 06/29/2015 0429   CL 103 11/25/2014 0518   CO2 35* 06/29/2015 0429   CO2 31 11/25/2014 0518   BUN 27* 06/29/2015 0429   BUN 25* 11/25/2014 0518   CREATININE 1.67* 06/29/2015 0429   CREATININE 0.66 11/25/2014 0518   GLUCOSE 96 06/29/2015 0429   GLUCOSE 83 11/25/2014 0518   CALCIUM 8.0* 06/29/2015 0429   CALCIUM 8.0* 11/25/2014 0518   AST 78* 06/16/2015 0520   AST 23 11/15/2014 1819   ALT 407* 06/16/2015 0520   ALT 14  11/15/2014 1819   ALKPHOS 147* 06/16/2015 0520   ALKPHOS 109 11/15/2014 1819   BILITOT 0.6 06/16/2015 0520   BILITOT 0.6 11/15/2014 1819   PROT 4.8* 06/16/2015 0520   PROT 7.1 11/15/2014 1819   ALBUMIN 1.9* 06/20/2015 0551   ALBUMIN 3.3* 11/15/2014 1819    More than 50% of the visit was spent in counseling/coordination of care: YES  Time Spent:  25 min

## 2015-06-29 NOTE — Evaluation (Signed)
Clinical/Bedside Swallow Evaluation Patient Details  Name: Lynn Butler MRN: [id number] Date of Birth: 11/18/1945  Today's Date: 06/29/2015 Time: SLP Start Time (ACUTE ONLY): 1115 SLP Stop Time (ACUTE ONLY): 1145 SLP Time Calculation (min) (ACUTE ONLY): 30 min  Past Medical History:  Past Medical History  Diagnosis Date  . COPD (chronic obstructive pulmonary disease) ([place])   . Hypertension   . Ischemic cardiomyopathy     a.   . Coronary artery disease     a. s/p multiple stenting in 2005  . History of ventricular tachycardia   . PVD (peripheral vascular disease) ([place])   . PAD (peripheral artery disease) ([place])   . HLD (hyperlipidemia)   . Chronic back pain   . MI, old   . MRSA (methicillin resistant staph aureus) culture positive     left foot wound  . CHF (congestive heart failure) ([place])   . Pneumonia   . Asthma   . Cancer [facility])    Past Surgical History:  Past Surgical History  Procedure Laterality Date  . Insert / replace / remove pacemaker    . Vascular bypass surgery Bilateral   . Cardiac catheterization N/A 01/14/2015    Procedure: Left Heart Cath;  Surgeon: Wellington Hampshire, MD;  Location: [place] CV LAB;  Service: Cardiovascular;  Laterality: N/A;  . Cardiac defibrillator placement    . Appendectomy    . Cesarean section    . Abdominal surgery     HPI:  Lynn Butler is a 69 y.o. female who presents with respiratory failure and hypoxia. Patient was found unresponsive by her daughter who called EMS and had her brought to the ED. EMS found the patient's O2 sats to be 50. Daughter states that her mother had been feeling unwell for the past couple of days, and was slowly getting worse. However, until this afternoon she has still been up and around and seemed to be feeling more ill, but was otherwise her usual self. Patient has severe COPD and is on oxygen at home at baseline. Her daughter felt like she had likely developed pneumonia given that she was acting.  Patient laid down to take a nap this afternoon, and her daughter leaned on room with her children to nap at the same time. After getting up, she went to check on her mother and found her nonresponsive. On evaluation in the ED, patient was found to be hypoxic and hypotensive. She was intubated and placed on a vent. She was given aggressive fluid resuscitation. Chest x-ray seems to show pneumonia. On lab evaluation she has what appears to be multiorgan dysfunction with elevated transaminases, elevated troponin, elevated creatinine, and a significantly elevated lactic acid. PH on ABG was 7.19. Hospitalists were called for admission.   Assessment / Plan / Recommendation Clinical Impression  Pt presents with no overt or immediate s/s of aspiration on thin liquids or solids (which is her current diet). Pt reports no concerns with her swallow and stated she has had the test before and understands its purpose. Pt did not demonstrate and oral pharyngeal phase dysphagia. She had one small burp after a sip of water, but does not report this is a common occurance. Pt takes her time eating and will pause to catch her breath between trials. ST educated on strategies such as small bites/sips and pausing between trials. ST also educated Pt to be mindful when using straws; Pt reported she always is careful. Pt would continue to benefit from a regular consistency diet with  thin liquids and general aspiration precautions. No skilled ST services indicated at this time. Should status change, ST is available for f/u while Pt is admitted. NSG updated.    Aspiration Risk   (reduced)    Diet Recommendation   Age appropriate regular solids with thin liquids (general aspiration precautions)  Medication Administration: Whole meds with liquid (with puree as necessary)    Other  Recommendations Oral Care Recommendations: Oral care BID;Staff/trained caregiver to provide oral care   Follow up Recommendations   (TBD)    Frequency  and Duration            Prognosis Prognosis for Safe Diet Advancement: Good      Swallow Study   General Date of Onset: 06/10/15 HPI: Lynn Butler is a 69 y.o. female who presents with respiratory failure and hypoxia. Patient was found unresponsive by her daughter who called EMS and had her brought to the ED. EMS found the patient's O2 sats to be 50. Daughter states that her mother had been feeling unwell for the past couple of days, and was slowly getting worse. However, until this afternoon she has still been up and around and seemed to be feeling more ill, but was otherwise her usual self. Patient has severe COPD and is on oxygen at home at baseline. Her daughter felt like she had likely developed pneumonia given that she was acting. Patient laid down to take a nap this afternoon, and her daughter leaned on room with her children to nap at the same time. After getting up, she went to check on her mother and found her nonresponsive. On evaluation in the ED, patient was found to be hypoxic and hypotensive. She was intubated and placed on a vent. She was given aggressive fluid resuscitation. Chest x-ray seems to show pneumonia. On lab evaluation she has what appears to be multiorgan dysfunction with elevated transaminases, elevated troponin, elevated creatinine, and a significantly elevated lactic acid. PH on ABG was 7.19. Hospitalists were called for admission. Type of Study: Bedside Swallow Evaluation Diet Prior to this Study: Regular;Thin liquids Temperature Spikes Noted: No Respiratory Status: Nasal cannula History of Recent Intubation: No Behavior/Cognition: Alert;Cooperative;Pleasant mood Oral Care Completed by SLP: No Oral Cavity - Dentition: Edentulous Vision: Functional for self-feeding Self-Feeding Abilities: Able to feed self;Needs set up Patient Positioning: Upright in bed Baseline Vocal Quality: Low vocal intensity Volitional Swallow: Able to elicit    Oral/Motor/Sensory  Function Overall Oral Motor/Sensory Function: Within functional limits (as observerd in bolus management)   Ice Chips Ice chips: Within functional limits Presentation: Spoon Other Comments: Pt comsumed 3 ice chips with no immediate or overt s/s of aspiration.   Thin Liquid Thin Liquid: Within functional limits Presentation: Cup;Self Fed Other Comments: Pt consumed 9 trials of thin liquid by cup with no overt or immediate s/s of aspiration.    Nectar Thick Nectar Thick Liquid: Not tested   Honey Thick Honey Thick Liquid: Not tested   Puree Puree: Not tested   Solid Solid: Within functional limits Presentation: Self Fed;Spoon Other Comments: Pt consumed 9+ trials of graham cracker and ice cream with no immediate or overt s/s of aspiration noted.       Lynn Butler 06/29/2015,11:55 AM

## 2015-06-29 NOTE — Consult Note (Signed)
Endocrine Initial Consult Note  Date of Consult: 06/29/2015  Consulting Service: [facility] Endocrinology  MD Requesting Consult: [place]: Reason for Consultation: hypoglycemia  History of Present Illness: Lynn Butler is a 69 y.o. female with PMH COPD, CAD, CHF, PAD, Arrhythmia, HCC, and other comorbidities who was admitted with hypoxic respiratory failure, pneumonia, and resulting septic shock. She has had a prolonged and complicated hospital course including CHF, renal failure on hemodialysis, NSTEMI.  Endocrinology has been consulted for hypoglycemia. She has had short courses of steroid The patient has no history of diabetes and has not received glucose lowering medication in the past couple days. Her fingerstick blood glucose was as low as 52 yesterday. There are no concurrent serum glucose measurements. She is receiving hydrocortisone 20 mg twice daily for presumed adrenal insufficiency.   Symptomatically, the patient endorses nausea, abdominal pain, lightheadedness, weight loss, poor appetite.       Past Medical History  Diagnosis Date  . COPD (chronic obstructive pulmonary disease) ([place])   . Hypertension   . Ischemic cardiomyopathy     a.   . Coronary artery disease     a. s/p multiple stenting in 2005  . History of ventricular tachycardia   . PVD (peripheral vascular disease) ([place])   . PAD (peripheral artery disease) ([place])   . HLD (hyperlipidemia)   . Chronic back pain   . MI, old   . MRSA (methicillin resistant staph aureus) culture positive     left foot wound  . CHF (congestive heart failure) ([place])   . Pneumonia   . Asthma   . Cancer [facility])    Past Surgical History  Procedure Laterality Date  . Insert / replace / remove pacemaker    . Vascular bypass surgery Bilateral   . Cardiac catheterization N/A 01/14/2015    Procedure: Left Heart Cath;  Surgeon: Wellington Hampshire, MD;  Location: [place] CV LAB;  Service: Cardiovascular;   Laterality: N/A;  . Cardiac defibrillator placement    . Appendectomy    . Cesarean section    . Abdominal surgery     Family History  Problem Relation Age of Onset  . CAD Other   . Breast cancer Mother   . Heart disease Mother   . Coronary artery disease Father     Social History:  Social History  Substance Use Topics  . Smoking status: Current Every Day Smoker -- 0.50 packs/day    Types: Cigarettes  . Smokeless tobacco: Never Used  . Alcohol Use: No     Allergies  Allergen Reactions  . Nsaids Nausea And Vomiting and Other (See Comments)    Reaction:  GI distress and burning      Medications:  No current facility-administered medications on file prior to encounter.   Current Outpatient Prescriptions on File Prior to Encounter  Medication Sig Dispense Refill  . atorvastatin (LIPITOR) 40 MG tablet Take 1 tablet (40 mg total) by mouth daily at 6 PM. 30 tablet 0  . benzonatate (TESSALON) 200 MG capsule Take 1 capsule (200 mg total) by mouth 3 (three) times daily as needed for cough. 20 capsule 0  . carvedilol (COREG) 3.125 MG tablet Take 1 tablet (3.125 mg total) by mouth 2 (two) times daily with a meal. 60 tablet 2  . diazepam (VALIUM) 10 MG tablet Take 1 tablet (10 mg total) by mouth every 12 (twelve) hours as needed for anxiety. 30 tablet 0  . feeding supplement, ENSURE ENLIVE, (ENSURE ENLIVE)  LIQD Take 237 mLs by mouth 3 (three) times daily with meals. 237 mL 12  . guaiFENesin (MUCINEX) 600 MG 12 hr tablet Take 1 tablet (600 mg total) by mouth 2 (two) times daily. 20 tablet 0  . HYDROmorphone (DILAUDID) 4 MG tablet Take 1 tablet (4 mg total) by mouth every 6 (six) hours as needed for severe pain. 15 tablet 0  . ipratropium-albuterol (DUONEB) 0.5-2.5 (3) MG/3ML SOLN Take 3 mLs by nebulization every 6 (six) hours as needed. (Patient taking differently: Take 3 mLs by nebulization every 6 (six) hours as needed (for wheezing/shortness of breath). ) 360 mL 0  . isosorbide  mononitrate (IMDUR) 30 MG 24 hr tablet Take 1 tablet (30 mg total) by mouth daily. 30 tablet 0  . loperamide (IMODIUM A-D) 2 MG tablet Take 1 tablet (2 mg total) by mouth 4 (four) times daily as needed for diarrhea or loose stools. 30 tablet 0  . mometasone-formoterol (DULERA) 100-5 MCG/ACT AERO Inhale 2 puffs into the lungs 2 (two) times daily.    . OxyCODONE (OXYCONTIN) 80 mg T12A 12 hr tablet Take 1 tablet (80 mg total) by mouth every 12 (twelve) hours. 14 tablet 0  . tiotropium (SPIRIVA) 18 MCG inhalation capsule Place 1 capsule (18 mcg total) into inhaler and inhale daily. 30 capsule 12     Review of Systems: Pertinent items noted in HPI and remainder of comprehensive ROS otherwise negative.  OBJECTIVE: Temp:  [97.6 F (36.4 C)-98.9 F (37.2 C)] 98.9 F (37.2 C) (11/30 0514) Pulse Rate:  [82-100] 84 (11/30 1243) Resp:  [18-22] 22 (11/30 1243) BP: (91-123)/(51-60) 91/60 mmHg (11/30 1243) SpO2:  [99 %-100 %] 100 % (11/30 1243) Weight:  [48.9 kg (107 lb 12.9 oz)] 48.9 kg (107 lb 12.9 oz) (11/29 1330)  Temp (24hrs), Avg:98.3 F (36.8 C), Min:97.6 F (36.4 C), Max:98.9 F (37.2 C)  Weight: 48.9 kg (107 lb 12.9 oz)  Physical Exam: Gen: no acute distress, sitting in bed, cachectic, appearing older than stated age HEENT: [place]/AT, eyes anicteric, EOMI, mucous membranes dry Neck: supple CAD: regular rate, regular rhythm, s1, s2 PULM: clear to ausculation anteriorly. GI: soft, non tender, +distended. EXT: no edema, bilateral heel pressure ulcers with dressings in place  Skin: warm, dry, no rash Neuro: grossly non focal, alert and oriented x 3  BMP Latest Ref Rng 06/29/2015 06/28/2015 06/27/2015  Glucose 65 - 99 mg/dL 96 112(H) 86  BUN 6 - 20 mg/dL 27(H) 53(H) 44(H)  Creatinine 0.44 - 1.00 mg/dL 1.67(H) 2.82(H) 2.41(H)  Sodium 135 - 145 mmol/L 141 140 139  Potassium 3.5 - 5.1 mmol/L 3.0(L) 3.3(L) 3.7  Chloride 101 - 111 mmol/L 98(L) 97(L) 100(L)  CO2 22 - 32 mmol/L 35(H) 32 34(H)   Calcium 8.9 - 10.3 mg/dL 8.0(L) 7.8(L) 7.6(L)    CBC Latest Ref Rng 06/27/2015 06/25/2015 06/25/2015  WBC 3.6 - 11.0 K/uL - 8.1 -  Hemoglobin 12.0 - 16.0 g/dL 7.8(L) 6.9(L) 7.6(L)  Hematocrit 35.0 - 47.0 % - 22.1(L) -  Platelets 150 - 440 K/uL - 163 -   Blood glucose values reviewed in glucose accordion view  ASSESSMENT:  69 yo woman hospitalized with pneumonia, acute respiratory failure, and septic shock who has had a complicated course. Hypoglycemia has been noted on fingerstick BG checks but no serum glucose measurements have documented actual hypoglycemia. Nonetheless, she has several reasons to have hypoglycemia including infection, renal failure, CHF, liver failure, etc. Adrenal insufficiency secondary to exogenous steroids is possible but we are  unable to assess since patient is already receiving hydrocortisone.    RECOMMENDATIONS:   Would reduce hydrocortisone to physiologic dosing: 20 mg at 8 am; 10 mg at 4 pm.  Monitor fingerstick BG every 4-6 hours. If BG<70, please confirm with a serum glucose then treat per hypoglycemia protocol. Check Hb A1c.  Ongoing nutritional management. Thank you for this consult.  Atha Starks, MD [facility] Endocrinology

## 2015-06-29 NOTE — Plan of Care (Signed)
Problem: Safety: Goal: Ability to remain free from injury will improve Outcome: Progressing No falls this shift. Calls out for assistance.  Problem: Pain Managment: Goal: General experience of comfort will improve Outcome: Progressing Pt c/o generalized pain. Dilaudid given PO, PRN.   Problem: Nutrition: Goal: Adequate nutrition will be maintained Outcome: Progressing Patient eating with set up assist. FSBS low this am, MD aware. Endocrinology consult called.

## 2015-06-29 NOTE — Care Management Important Message (Signed)
Important Message  Patient Details  Name: Lynn Butler MRN: [id number] Date of Birth: 1945-09-18   Medicare Important Message Given:  Yes    Shelbie Ammons, RN 06/29/2015, 12:58 PM

## 2015-06-29 NOTE — Plan of Care (Signed)
Problem: Safety: Goal: Ability to remain free from injury will improve Outcome: Progressing High fall risk. Pt did not exhibit any impulsiveness.   Problem: Pain Managment: Goal: General experience of comfort will improve Outcome: Progressing Pt given Dilaudid 6 mg po without relief of abdominal pain. So, pt given Morphine 2 mg IV. PO phenergan given as well for complaints of nausea. Just given so re-evaluation is still pending. Will continue to monitor .   Problem: Skin Integrity: Goal: Risk for impaired skin integrity will decrease Outcome: Progressing Pt with impaired skin integrity. New sacral dressing applied to sacrum for protection. Left foot with pressure ulcer, dressing clean, dry and intact. Heels bilateral with bogginess. Foam dressings in place, heels floated to prevent further skin breakdown.  Problem: Nutrition: Goal: Adequate nutrition will be maintained Outcome: Progressing Ensure supplement provided. Pt said she has bad hand tremors, will assess the need for assistance with meals. C/o nausea- Phenergan given po.

## 2015-06-29 NOTE — Progress Notes (Signed)
[facility]  ROUNDING NOTE   Subjective:  Shortness of breath improved. Tolerated dialysis well yesterday. Ultrafiltration achieved yesterday was 1.5 L.   Objective:  Vital signs in last 24 hours:  Temp:  [97.6 F (36.4 C)-98.9 F (37.2 C)] 98.9 F (37.2 C) (11/30 0514) Pulse Rate:  [82-100] 100 (11/30 0514) Resp:  [18-25] 20 (11/30 0514) BP: (89-123)/(51-70) 123/59 mmHg (11/30 0514) SpO2:  [95 %-100 %] 100 % (11/30 0514) Weight:  [48.9 kg (107 lb 12.9 oz)] 48.9 kg (107 lb 12.9 oz) (11/29 1330)  Weight change:  Filed Weights   06/25/15 1505 06/28/15 0945 06/28/15 1330  Weight: 56.7 kg (125 lb) 50.4 kg (111 lb 1.8 oz) 48.9 kg (107 lb 12.9 oz)    Intake/Output: I/O last 3 completed shifts: In: -  Out: 2050 [Urine:550; Other:1500]   Intake/Output this shift:     Physical Exam: General: Chronically ill appearing  Head: Healing wound on forehead  E, ENT: Moist oral mucus membranes  Neck: Supple  Lungs:  Bilateral rhonchi, normal effort  Heart: S1S2 no rubs  Abdomen:  Soft, nontender, BS present   Extremities:  trace peripheral edema.  Neurologic: Awake, alert, following commands  Skin: Large forehead scar from prior skin cancer  Access: Left femoral non-tunneled catheter    Basic Metabolic Panel:  Recent Labs Lab 06/24/15 0522 06/25/15 0554 06/25/15 1510 06/27/15 0456 06/28/15 0826 06/29/15 0429  NA 138 137  --  139 140 141  K 4.5 4.3  --  3.7 3.3* 3.0*  CL 105 102  --  100* 97* 98*  CO2 28 27  --  34* 32 35*  GLUCOSE 86 88  --  86 112* 96  BUN 52* 61*  --  44* 53* 27*  CREATININE 3.83* 3.98*  --  2.41* 2.82* 1.67*  CALCIUM 8.0* 8.0*  --  7.6* 7.8* 8.0*  PHOS  --   --  3.2  --   --   --     Liver Function Tests: No results for input(s): AST, ALT, ALKPHOS, BILITOT, PROT, ALBUMIN in the last 168 hours. No results for input(s): LIPASE, AMYLASE in the last 168 hours. No results for input(s): AMMONIA in the last 168 hours.  CBC:  Recent  Labs Lab 06/23/15 0557 06/25/15 0554 06/25/15 1510 06/27/15 0456  WBC  --   --  8.1  --   HGB 7.1* 7.6* 6.9* 7.8*  HCT  --   --  22.1*  --   MCV  --   --  77.9*  --   PLT  --   --  163  --     Cardiac Enzymes: No results for input(s): CKTOTAL, CKMB, CKMBINDEX, TROPONINI in the last 168 hours.  BNP: Invalid input(s): POCBNP  CBG:  Recent Labs Lab 06/29/15 0447 06/29/15 0611 06/29/15 0728 06/29/15 0814 06/29/15 0845  GLUCAP 70 94 56* 9* 116*    Microbiology: Results for orders placed or performed during the hospital encounter of 06/10/15  Urine culture     Status: None   Collection Time: 06/10/15  7:30 PM  Result Value Ref Range Status   Specimen Description URINE, RANDOM  Final   Special Requests NONE  Final   Culture NO GROWTH 2 DAYS  Final   Report Status 06/12/2015 FINAL  Final  Blood Culture (routine x 2)     Status: None   Collection Time: 06/10/15  8:10 PM  Result Value Ref Range Status   Specimen Description BLOOD RIGHT  Final   Special Requests BOTTLES DRAWN AEROBIC AND ANAEROBIC 5CC  Final   Culture  Setup Time   Final    GRAM POSITIVE COCCI AEROBIC BOTTLE ONLY CRITICAL RESULT CALLED TO, READ BACK BY AND VERIFIED WITH: RN Kathi Simpers 06/14/15 1358    Culture   Final    COAGULASE NEGATIVE STAPHYLOCOCCUS Results consistent with contamination.    Report Status 06/16/2015 FINAL  Final  Blood Culture (routine x 2)     Status: None   Collection Time: 06/10/15  8:20 PM  Result Value Ref Range Status   Specimen Description BLOOD RIGHT  Final   Special Requests BOTTLES DRAWN AEROBIC AND ANAEROBIC 2CC AERO, 4CC  Final   Culture NO GROWTH 5 DAYS  Final   Report Status 06/15/2015 FINAL  Final  MRSA PCR Screening     Status: Abnormal   Collection Time: 06/11/15  1:40 AM  Result Value Ref Range Status   MRSA by PCR POSITIVE (A) NEGATIVE Final    Comment:        The GeneXpert MRSA Assay (FDA approved for NASAL specimens only), is one component  of a comprehensive MRSA colonization surveillance program. It is not intended to diagnose MRSA infection nor to guide or monitor treatment for MRSA infections. CRITICAL RESULT CALLED TO, READ BACK BY AND VERIFIED WITH: [facility] ALLEN AT [id number] 06/11/15.PMH   Culture, sputum-assessment     Status: None   Collection Time: 06/11/15  4:30 AM  Result Value Ref Range Status   Specimen Description TRACHEAL ASPIRATE  Final   Special Requests NONE  Final   Sputum evaluation THIS SPECIMEN IS ACCEPTABLE FOR SPUTUM CULTURE  Final   Report Status 06/11/2015 FINAL  Final  Culture, respiratory (NON-Expectorated)     Status: None   Collection Time: 06/11/15  4:30 AM  Result Value Ref Range Status   Specimen Description TRACHEAL ASPIRATE  Final   Special Requests NONE Reflexed from [id number]  Final   Gram Stain   Final    GOOD SPECIMEN - 80-90% WBCS MODERATE WBC SEEN MODERATE GRAM POSITIVE COCCI RARE GRAM NEGATIVE RODS    Culture HEAVY GROWTH STREPTOCOCCUS PNEUMONIAE  Final   Report Status 06/13/2015 FINAL  Final   Organism ID, Bacteria STREPTOCOCCUS PNEUMONIAE  Final      Susceptibility   Streptococcus pneumoniae - MIC*    ERYTHROMYCIN <=0.12 SENSITIVE Sensitive     VANCOMYCIN 0.5 SENSITIVE Sensitive     TRIMETH/SULFA <=10 SENSITIVE Sensitive     CLINDAMYCIN <=0.25 SENSITIVE Sensitive     LEVOFLOXACIN Value in next row Sensitive      SENSITIVE1    LINEZOLID Value in next row Sensitive      SENSITIVE<=2    * HEAVY GROWTH STREPTOCOCCUS PNEUMONIAE  C difficile quick scan w PCR reflex     Status: None   Collection Time: 06/20/15  4:30 AM  Result Value Ref Range Status   C Diff antigen NEGATIVE NEGATIVE Final   C Diff toxin NEGATIVE NEGATIVE Final   C Diff interpretation Negative for C. difficile  Final    Coagulation Studies: No results for input(s): LABPROT, INR in the last 72 hours.  Urinalysis:  Recent Labs  06/28/15 0527  COLORURINE YELLOW*  LABSPEC 1.005  PHURINE 7.0  GLUCOSEU  NEGATIVE  HGBUR NEGATIVE  BILIRUBINUR NEGATIVE  KETONESUR NEGATIVE  PROTEINUR NEGATIVE  NITRITE NEGATIVE  LEUKOCYTESUR NEGATIVE      Imaging: Dg Chest Port 1 View  06/28/2015  CLINICAL DATA:  Dyspnea.  Altered mental status. EXAM: PORTABLE CHEST 1 VIEW COMPARISON:  06/20/2015 FINDINGS: There is unchanged cardiomegaly. There are intact appearances of the transvenous cardiac leads. Mild central vascular prominence and interstitial thickening have worsened from 06/20/2015. This likely represents a degree of congestive heart failure. No confluent airspace opacity. No large effusions. IMPRESSION: Mild congestive heart failure. Electronically Signed   By: Andreas Newport M.D.   On: 06/28/2015 06:53     Medications:     . aspirin  81 mg Oral Daily  . atorvastatin  40 mg Oral q1800  . budesonide  0.25 mg Nebulization BID  . feeding supplement (ENSURE ENLIVE)  237 mL Oral TID  . furosemide  80 mg Oral Daily  . hydrocortisone  20 mg Oral BID WC  . ipratropium-albuterol  3 mL Nebulization TID  . oxyCODONE  80 mg Oral Q12H  . sodium chloride  10-40 mL Intracatheter Q12H  . sodium chloride  3 mL Intravenous Q12H   sodium chloride, sodium chloride, acetaminophen **OR** [DISCONTINUED] acetaminophen, alteplase, diazepam, diphenoxylate-atropine, glycopyrrolate, guaiFENesin, heparin, HYDROmorphone, lidocaine (PF), lidocaine-prilocaine, morphine injection, pentafluoroprop-tetrafluoroeth, polyvinyl alcohol, promethazine, senna-docusate, sodium chloride, sodium chloride, zolpidem  Assessment/ Plan:  69 y.o. female  with a PMHx of COPD, hypertension, ischemic cardiomyopathy with multiple coronary artery stents, history of ventricular tachycardia, peripheral vascular disease, hyperlipidemia, chronic back pain, history of congestive heart failure, who was admitted to [facility] on 06/10/2015 for evaluation of respiratory failure.   1. Acute renal failure secondary to acute tubular necrosis. Patient  presented with multiorgan failure upon presentation. Baseline creatinine 0.75. - Patient had hemodialysis yesterday. Tolerated well. No urgent indication for dialysis today. We will plan for dialysis again tomorrow. .    2. Hyperkalemia.   -- resolved with HD.  3. Acute respiratory failure.  - respiratory status has improved significantly today. Ultrafiltration likely helped in this situation. Continue conservative management for now..  4. Acute myocardial infarction.  Appears resolved.  5.  Anemia unspecified: No new hemoglobin today. Consider repeating hemoglobin in the near future.  LOS: [place], Tanai Bouler 11/30/201610:04 AM

## 2015-06-30 ENCOUNTER — Encounter: Payer: Self-pay | Admitting: Radiology

## 2015-06-30 ENCOUNTER — Inpatient Hospital Stay: Payer: Medicare Other

## 2015-06-30 LAB — CBC
HCT: 27.2 % — ABNORMAL LOW (ref 35.0–47.0)
Hemoglobin: 8.7 g/dL — ABNORMAL LOW (ref 12.0–16.0)
MCH: 24.7 pg — AB (ref 26.0–34.0)
MCHC: 31.8 g/dL — AB (ref 32.0–36.0)
MCV: 77.6 fL — AB (ref 80.0–100.0)
PLATELETS: 151 10*3/uL (ref 150–440)
RBC: 3.51 MIL/uL — AB (ref 3.80–5.20)
RDW: 28 % — AB (ref 11.5–14.5)
WBC: 5.1 10*3/uL (ref 3.6–11.0)

## 2015-06-30 LAB — BASIC METABOLIC PANEL
Anion gap: 9 (ref 5–15)
BUN: 28 mg/dL — AB (ref 6–20)
CHLORIDE: 93 mmol/L — AB (ref 101–111)
CO2: 36 mmol/L — AB (ref 22–32)
CREATININE: 1.83 mg/dL — AB (ref 0.44–1.00)
Calcium: 7.2 mg/dL — ABNORMAL LOW (ref 8.9–10.3)
GFR calc Af Amer: 32 mL/min — ABNORMAL LOW (ref >60)
GFR calc non Af Amer: 27 mL/min — ABNORMAL LOW (ref >60)
GLUCOSE: 133 mg/dL — AB (ref 65–99)
Potassium: 2.3 mmol/L — CL (ref 3.5–5.1)
Sodium: 138 mmol/L (ref 135–145)

## 2015-06-30 LAB — GLUCOSE, CAPILLARY
GLUCOSE-CAPILLARY: 114 mg/dL — AB (ref 65–99)
GLUCOSE-CAPILLARY: 142 mg/dL — AB (ref 65–99)
GLUCOSE-CAPILLARY: 179 mg/dL — AB (ref 65–99)
GLUCOSE-CAPILLARY: 80 mg/dL (ref 65–99)
GLUCOSE-CAPILLARY: 85 mg/dL (ref 65–99)
Glucose-Capillary: 73 mg/dL (ref 65–99)
Glucose-Capillary: 86 mg/dL (ref 65–99)
Glucose-Capillary: 75 mg/dL (ref 65–99)
Glucose-Capillary: 167 mg/dL — ABNORMAL HIGH (ref 65–99)
Glucose-Capillary: 136 mg/dL — ABNORMAL HIGH (ref 65–99)
Glucose-Capillary: 107 mg/dL — ABNORMAL HIGH (ref 65–99)

## 2015-06-30 LAB — HEMOGLOBIN A1C: HEMOGLOBIN A1C: 5.7 % (ref 4.0–6.0)

## 2015-06-30 MED ORDER — IOHEXOL 240 MG/ML SOLN
25.0000 mL | INTRAMUSCULAR | Status: AC
  Administered 2015-06-30 (×2): 25 mL via ORAL

## 2015-06-30 MED ORDER — POTASSIUM CHLORIDE CRYS ER 20 MEQ PO TBCR
40.0000 meq | EXTENDED_RELEASE_TABLET | Freq: Once | ORAL | Status: AC
  Administered 2015-06-30: 21:00:00 40 meq via ORAL
  Filled 2015-06-30: qty 2

## 2015-06-30 MED ORDER — MAGNESIUM SULFATE 2 GM/50ML IV SOLN
2.0000 g | Freq: Once | INTRAVENOUS | Status: AC
  Administered 2015-06-30: 2 g via INTRAVENOUS
  Filled 2015-06-30: qty 50

## 2015-06-30 MED ORDER — POTASSIUM CHLORIDE CRYS ER 20 MEQ PO TBCR
40.0000 meq | EXTENDED_RELEASE_TABLET | Freq: Once | ORAL | Status: AC
  Administered 2015-06-30: 40 meq via ORAL
  Filled 2015-06-30: qty 2

## 2015-06-30 MED ORDER — DILTIAZEM HCL 30 MG PO TABS
60.0000 mg | ORAL_TABLET | Freq: Four times a day (QID) | ORAL | Status: DC
  Administered 2015-06-30: 60 mg via ORAL
  Filled 2015-06-30 (×2): qty 2

## 2015-06-30 MED ORDER — POTASSIUM CHLORIDE 10 MEQ/100ML IV SOLN
10.0000 meq | INTRAVENOUS | Status: AC
  Administered 2015-06-30 (×2): 10 meq via INTRAVENOUS
  Filled 2015-06-30 (×2): qty 100

## 2015-06-30 MED ORDER — HYDROCORTISONE 10 MG PO TABS
20.0000 mg | ORAL_TABLET | Freq: Every day | ORAL | Status: DC
  Administered 2015-07-01 – 2015-07-05 (×5): 20 mg via ORAL
  Filled 2015-06-30 (×5): qty 2

## 2015-06-30 MED ORDER — FUROSEMIDE 40 MG PO TABS
40.0000 mg | ORAL_TABLET | Freq: Every day | ORAL | Status: DC
  Administered 2015-07-01 – 2015-07-05 (×5): 40 mg via ORAL
  Filled 2015-06-30 (×5): qty 1

## 2015-06-30 MED ORDER — DILTIAZEM HCL 30 MG PO TABS
30.0000 mg | ORAL_TABLET | Freq: Four times a day (QID) | ORAL | Status: DC
  Administered 2015-06-30 – 2015-07-05 (×20): 30 mg via ORAL
  Filled 2015-06-30 (×18): qty 1

## 2015-06-30 MED ORDER — DILTIAZEM HCL 25 MG/5ML IV SOLN
10.0000 mg | Freq: Once | INTRAVENOUS | Status: DC
  Filled 2015-06-30: qty 5

## 2015-06-30 MED ORDER — HYDROCORTISONE 10 MG PO TABS
10.0000 mg | ORAL_TABLET | Freq: Every day | ORAL | Status: DC
  Administered 2015-06-30 – 2015-07-05 (×6): 10 mg via ORAL
  Filled 2015-06-30 (×7): qty 1

## 2015-06-30 MED ORDER — POTASSIUM CHLORIDE CRYS ER 20 MEQ PO TBCR
40.0000 meq | EXTENDED_RELEASE_TABLET | Freq: Once | ORAL | Status: AC
  Administered 2015-06-30: 16:00:00 40 meq via ORAL
  Filled 2015-06-30: qty 2

## 2015-06-30 NOTE — Plan of Care (Signed)
Problem: Safety: Goal: Ability to remain free from injury will improve Outcome: Progressing Pt is high fall risk. Bed alarm activated. No unsafe behaviors noted. Calls for assistance.   Problem: Pain Managment: Goal: General experience of comfort will improve Outcome: Progressing Pain management. Pain evaluated q 4 hrs. Morphine 2 mg given twice for pain this shift.   Problem: Skin Integrity: Goal: Risk for impaired skin integrity will decrease Outcome: Progressing Heels floated. Pt turned and repositioned. Foam to bilateral heels (boggy) and sacrum to protect. Wound care provided to left anterior foot.   Problem: Nutrition: Goal: Adequate nutrition will be maintained Outcome: Progressing Pt on regular diet. No complaints of nausea so far this shift. Pt given choices. Coffee given per patient request.

## 2015-06-30 NOTE — Progress Notes (Signed)
Patient ID: Lynn Butler, female   DOB: 1946-01-01, 69 y.o.   MRN: [id number] [facility] PROGRESS NOTE  PCP: Juanell Fairly, MD  HPI/Subjective: Patient seen earlier and again now. Now I was called with fast heart rate of 160. When I got into the room her heart rate was in the 90s. In speaking with him on the telemetry monitoring that she's been in and out of atrial fibrillation and currently sinus arrhythmia. Earlier this morning the patient was complaining of abdominal pain and some nausea.  Objective: Filed Vitals:   06/30/15 1100 06/30/15 1340  BP: 119/41 105/61  Pulse: 99 95  Temp: 99.3 F (37.4 C)   Resp: 20 20    Filed Weights   06/25/15 1505 06/28/15 0945 06/28/15 1330  Weight: 56.7 kg (125 lb) 50.4 kg (111 lb 1.8 oz) 48.9 kg (107 lb 12.9 oz)    ROS: Review of Systems  Constitutional: Negative for fever and chills.  Eyes: Negative for blurred vision.  Respiratory: Positive for cough and shortness of breath.   Cardiovascular: Negative for chest pain.  Gastrointestinal: Positive for nausea, abdominal pain and diarrhea. Negative for vomiting and constipation.  Genitourinary: Negative for dysuria.  Musculoskeletal: Positive for back pain. Negative for joint pain.  Neurological: Positive for tremors. Negative for dizziness and headaches.   Exam: Physical Exam  Constitutional: She is oriented to person, place, and time.  HENT:  Nose: No mucosal edema.  Mouth/Throat: No oropharyngeal exudate or posterior oropharyngeal edema.  Eyes: Conjunctivae, EOM and lids are normal. Pupils are equal, round, and reactive to light.  Neck: No JVD present. Carotid bruit is not present. No edema present. No thyroid mass and no thyromegaly present.  Cardiovascular: S1 normal and S2 normal.  Exam reveals no gallop.   No murmur heard. Pulses:      Dorsalis pedis pulses are 0 on the right side, and 0 on the left side.  Respiratory: No accessory muscle usage. No respiratory  distress. She has decreased breath sounds in the right lower field and the left lower field. She has wheezes in the right lower field. She has no rhonchi. She has rales in the right lower field and the left lower field.  GI: Soft. Bowel sounds are normal. There is generalized tenderness.  Musculoskeletal:       Right ankle: She exhibits no swelling.       Left ankle: She exhibits no swelling.  Lymphadenopathy:    She has no cervical adenopathy.  Neurological: She is alert and oriented to person, place, and time. No cranial nerve deficit.  Skin: Skin is warm. Nails show no clubbing.  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 Skin graft on  forehead with scab. Sacral decubiti stage II.  Psychiatric: She has a normal mood and affect.    Data Reviewed: Basic Metabolic Panel:  Recent Labs Lab 06/24/15 0522 06/25/15 0554 06/25/15 1510 06/27/15 0456 06/28/15 0826 06/29/15 0429  NA 138 137  --  139 140 141  K 4.5 4.3  --  3.7 3.3* 3.0*  CL 105 102  --  100* 97* 98*  CO2 28 27  --  34* 32 35*  GLUCOSE 86 88  --  86 112* 96  BUN 52* 61*  --  44* 53* 27*  CREATININE 3.83* 3.98*  --  2.41* 2.82* 1.67*  CALCIUM 8.0* 8.0*  --  7.6* 7.8* 8.0*  PHOS  --   --  3.2  --   --   --  I just CBC:  Recent Labs Lab 06/25/15 0554 06/25/15 1510 06/27/15 0456  WBC  --  8.1  --   HGB 7.6* 6.9* 7.8*  HCT  --  22.1*  --   MCV  --  77.9*  --   PLT  --  163  --      Recent Results (from the past 240 hour(s))  C difficile quick scan w PCR reflex     Status: None   Collection Time: 06/29/15 10:27 AM  Result Value Ref Range Status   C Diff antigen NEGATIVE NEGATIVE Final   C Diff toxin NEGATIVE NEGATIVE Final   C Diff interpretation VALID  Final    Scheduled Meds: . aspirin  81 mg Oral Daily  . atorvastatin  40 mg Oral q1800  . budesonide  0.25 mg Nebulization BID  . diltiazem  10 mg Intravenous Once  . diltiazem  60 mg Oral 4 times per day  . feeding supplement (ENSURE ENLIVE)   237 mL Oral TID  . furosemide  80 mg Oral Daily  . hydrocortisone  10 mg Oral Daily  . [START ON 07/01/2015] hydrocortisone  20 mg Oral Daily  . ipratropium-albuterol  3 mL Nebulization TID  . oxyCODONE  80 mg Oral Q12H  . sodium chloride  10-40 mL Intracatheter Q12H  . sodium chloride  3 mL Intravenous Q12H    Assessment/Plan:  1. Paroxysmal atrial fibrillation. Start Cardizem 60 mg now and 30 mg every 6 hours. Not a great candidate for continuous anticoagulation.  2. Abdominal pain - CT scan of the abdomen today.  3. Acute systolic congestive heart failure. Improved after dialysis yesterday. No beta blocker with bronchospasm. No ACE inhibitor with borderline renal function. Decreased dose of Lasix daily. 4. Septic shock secondary to pneumonia. Sputum culture grew out Streptococcus pneumoniae. Patient finished treatment with IV Rocephin. 5. Hypoglycemia- appreciate endocrine consult. Patient is not a diabetic. Sugars are trending better. For possible adrenal insufficiency on hydrocortisone 20 mg in the morning, 10 mg in the p.m. 6. Acute respiratory failure, COPD exacerbation and pneumonia and now heart failure. Patient does not want further intubation or BiPAP. Patient extubated 11/21. Patient on chronic oxygen at home. Continue nebulizer treatments. 7. Acute renal failure. Nephrology following for the need of dialysis or not. 8. Acute myocardial infarction NSTEMI- aspirin, statin and medical management. No beta blocker with bronchospasm and end-stage COPD 9. Acute on chronic anemia- hemoglobin 7.8 10. Chronic pain syndrome on oxycodone and dilaudid and Valium 11. Weakness- Will likely need long-term care 12. Diarrhea- repeat stool for C. difficile is negative. 13. Nausea- Phenergan as needed for nausea.  Code Status:     Code Status Orders        Start     Ordered   06/19/15 1140  Do not attempt resuscitation (DNR)   Continuous    Question Answer Comment  In the event of cardiac  or respiratory ARREST Do not call a "code blue"   In the event of cardiac or respiratory ARREST Do not perform Intubation, CPR, defibrillation or ACLS   In the event of cardiac or respiratory ARREST Use medication by any route, position, wound care, and other measures to relive pain and suffering. May use oxygen, suction and manual treatment of airway obstruction as needed for comfort.      06/19/15 1140      Disposition Plan: Will need long-term care   Consultants:  Critical care specialist  Nephrology  Palliative care team  Time spent: 28 minutes, daughter at the bedside.  Loletha Grayer  [facility]

## 2015-06-30 NOTE — Progress Notes (Addendum)
Reported to Dr Earleen Newport that patient has elevated heart rate in 160's now in 140's. Ordered cardizem 10mg  IV x1, cardizem 60mg  po every 6 hours 1st dose now. On telemetry patient is in and out of a fib.

## 2015-06-30 NOTE — Progress Notes (Addendum)
Dr Dara Hoyer 2.3, acknowledged, no orders given

## 2015-06-30 NOTE — Progress Notes (Signed)
[facility]  ROUNDING NOTE   Subjective:  Pt seen at bedside. Overall respiratory status has improved.  No new renal function testing today.   Objective:  Vital signs in last 24 hours:  Temp:  [98.6 F (37 C)] 98.6 F (37 C) (11/30 2100) Pulse Rate:  [84-91] 91 (11/30 2100) Resp:  [18-22] 18 (11/30 2100) BP: (91-102)/(56-60) 102/56 mmHg (11/30 2100) SpO2:  [100 %] 100 % (12/01 0741)  Weight change:  Filed Weights   06/25/15 1505 06/28/15 0945 06/28/15 1330  Weight: 56.7 kg (125 lb) 50.4 kg (111 lb 1.8 oz) 48.9 kg (107 lb 12.9 oz)    Intake/Output:     Intake/Output this shift:     Physical Exam: General: Chronically ill appearing  Head: Healing wound on forehead  E, ENT: Moist oral mucus membranes  Neck: Supple  Lungs:  Bilateral rhonchi, normal effort  Heart: S1S2 no rubs  Abdomen:  Soft, nontender, BS present   Extremities:  trace peripheral edema.  Neurologic: Awake, alert, following commands  Skin: Large forehead scar from prior skin cancer  Access: Left femoral non-tunneled catheter    Basic Metabolic Panel:  Recent Labs Lab 06/24/15 0522 06/25/15 0554 06/25/15 1510 06/27/15 0456 06/28/15 0826 06/29/15 0429  NA 138 137  --  139 140 141  K 4.5 4.3  --  3.7 3.3* 3.0*  CL 105 102  --  100* 97* 98*  CO2 28 27  --  34* 32 35*  GLUCOSE 86 88  --  86 112* 96  BUN 52* 61*  --  44* 53* 27*  CREATININE 3.83* 3.98*  --  2.41* 2.82* 1.67*  CALCIUM 8.0* 8.0*  --  7.6* 7.8* 8.0*  PHOS  --   --  3.2  --   --   --     Liver Function Tests: No results for input(s): AST, ALT, ALKPHOS, BILITOT, PROT, ALBUMIN in the last 168 hours. No results for input(s): LIPASE, AMYLASE in the last 168 hours. No results for input(s): AMMONIA in the last 168 hours.  CBC:  Recent Labs Lab 06/25/15 0554 06/25/15 1510 06/27/15 0456  WBC  --  8.1  --   HGB 7.6* 6.9* 7.8*  HCT  --  22.1*  --   MCV  --  77.9*  --   PLT  --  163  --     Cardiac  Enzymes: No results for input(s): CKTOTAL, CKMB, CKMBINDEX, TROPONINI in the last 168 hours.  BNP: Invalid input(s): POCBNP  CBG:  Recent Labs Lab 06/30/15 0225 06/30/15 0422 06/30/15 0609 06/30/15 0740 06/30/15 1003  GLUCAP 73 86 80 75 167*    Microbiology: Results for orders placed or performed during the hospital encounter of 06/10/15  Urine culture     Status: None   Collection Time: 06/10/15  7:30 PM  Result Value Ref Range Status   Specimen Description URINE, RANDOM  Final   Special Requests NONE  Final   Culture NO GROWTH 2 DAYS  Final   Report Status 06/12/2015 FINAL  Final  Blood Culture (routine x 2)     Status: None   Collection Time: 06/10/15  8:10 PM  Result Value Ref Range Status   Specimen Description BLOOD RIGHT  Final   Special Requests BOTTLES DRAWN AEROBIC AND ANAEROBIC 5CC  Final   Culture  Setup Time   Final    GRAM POSITIVE COCCI AEROBIC BOTTLE ONLY CRITICAL RESULT CALLED TO, READ BACK BY AND VERIFIED WITH: RN  Kathi Simpers 06/14/15 1358    Culture   Final    COAGULASE NEGATIVE STAPHYLOCOCCUS Results consistent with contamination.    Report Status 06/16/2015 FINAL  Final  Blood Culture (routine x 2)     Status: None   Collection Time: 06/10/15  8:20 PM  Result Value Ref Range Status   Specimen Description BLOOD RIGHT  Final   Special Requests BOTTLES DRAWN AEROBIC AND ANAEROBIC 2CC AERO, 4CC  Final   Culture NO GROWTH 5 DAYS  Final   Report Status 06/15/2015 FINAL  Final  MRSA PCR Screening     Status: Abnormal   Collection Time: 06/11/15  1:40 AM  Result Value Ref Range Status   MRSA by PCR POSITIVE (A) NEGATIVE Final    Comment:        The GeneXpert MRSA Assay (FDA approved for NASAL specimens only), is one component of a comprehensive MRSA colonization surveillance program. It is not intended to diagnose MRSA infection nor to guide or monitor treatment for MRSA infections. CRITICAL RESULT CALLED TO, READ BACK BY AND  VERIFIED WITH: [facility] ALLEN AT [id number] 06/11/15.PMH   Culture, sputum-assessment     Status: None   Collection Time: 06/11/15  4:30 AM  Result Value Ref Range Status   Specimen Description TRACHEAL ASPIRATE  Final   Special Requests NONE  Final   Sputum evaluation THIS SPECIMEN IS ACCEPTABLE FOR SPUTUM CULTURE  Final   Report Status 06/11/2015 FINAL  Final  Culture, respiratory (NON-Expectorated)     Status: None   Collection Time: 06/11/15  4:30 AM  Result Value Ref Range Status   Specimen Description TRACHEAL ASPIRATE  Final   Special Requests NONE Reflexed from [id number]  Final   Gram Stain   Final    GOOD SPECIMEN - 80-90% WBCS MODERATE WBC SEEN MODERATE GRAM POSITIVE COCCI RARE GRAM NEGATIVE RODS    Culture HEAVY GROWTH STREPTOCOCCUS PNEUMONIAE  Final   Report Status 06/13/2015 FINAL  Final   Organism ID, Bacteria STREPTOCOCCUS PNEUMONIAE  Final      Susceptibility   Streptococcus pneumoniae - MIC*    ERYTHROMYCIN <=0.12 SENSITIVE Sensitive     VANCOMYCIN 0.5 SENSITIVE Sensitive     TRIMETH/SULFA <=10 SENSITIVE Sensitive     CLINDAMYCIN <=0.25 SENSITIVE Sensitive     LEVOFLOXACIN Value in next row Sensitive      SENSITIVE1    LINEZOLID Value in next row Sensitive      SENSITIVE<=2    * HEAVY GROWTH STREPTOCOCCUS PNEUMONIAE  C difficile quick scan w PCR reflex     Status: None   Collection Time: 06/20/15  4:30 AM  Result Value Ref Range Status   C Diff antigen NEGATIVE NEGATIVE Final   C Diff toxin NEGATIVE NEGATIVE Final   C Diff interpretation Negative for C. difficile  Final  C difficile quick scan w PCR reflex     Status: None   Collection Time: 06/29/15 10:27 AM  Result Value Ref Range Status   C Diff antigen NEGATIVE NEGATIVE Final   C Diff toxin NEGATIVE NEGATIVE Final   C Diff interpretation VALID  Final    Coagulation Studies: No results for input(s): LABPROT, INR in the last 72 hours.  Urinalysis:  Recent Labs  06/28/15 0527  COLORURINE YELLOW*   LABSPEC 1.005  PHURINE 7.0  GLUCOSEU NEGATIVE  HGBUR NEGATIVE  BILIRUBINUR NEGATIVE  KETONESUR NEGATIVE  PROTEINUR NEGATIVE  NITRITE NEGATIVE  LEUKOCYTESUR NEGATIVE      Imaging: No results found.  Medications:     . aspirin  81 mg Oral Daily  . atorvastatin  40 mg Oral q1800  . budesonide  0.25 mg Nebulization BID  . feeding supplement (ENSURE ENLIVE)  237 mL Oral TID  . furosemide  80 mg Oral Daily  . hydrocortisone  20 mg Oral BID WC  . ipratropium-albuterol  3 mL Nebulization TID  . oxyCODONE  80 mg Oral Q12H  . sodium chloride  10-40 mL Intracatheter Q12H  . sodium chloride  3 mL Intravenous Q12H   sodium chloride, sodium chloride, acetaminophen **OR** [DISCONTINUED] acetaminophen, alteplase, diazepam, diphenoxylate-atropine, glycopyrrolate, guaiFENesin, heparin, HYDROmorphone, lidocaine (PF), lidocaine-prilocaine, morphine injection, pentafluoroprop-tetrafluoroeth, polyvinyl alcohol, promethazine, senna-docusate, sodium chloride, sodium chloride, zolpidem  Assessment/ Plan:  69 y.o. female  with a PMHx of COPD, hypertension, ischemic cardiomyopathy with multiple coronary artery stents, history of ventricular tachycardia, peripheral vascular disease, hyperlipidemia, chronic back pain, history of congestive heart failure, who was [facility] on 06/10/2015 for evaluation of respiratory failure.   1. Acute renal failure secondary to acute tubular necrosis. Patient presented with multiorgan failure upon presentation. Baseline creatinine 0.75. - No new renal function testing available today. Therefore we will order BMP for today. Patient no longer has Foley catheter in place therefore it is difficult to accurately measure urine output. No urgent indication for dialysis today. She is still not considered end-stage renal disease at this time.    2. Hyperkalemia.   -- resolved with HD.  3. Acute respiratory failure.  - 2 days ago the patient had significant shortness  of breath which improved with hemodialysis. She is breathing comfortably today.  4. Acute myocardial infarction.  Appears resolved.  5.  Anemia unspecified: We will check CBC today.  LOS: [place], Tanea Moga 12/1/201610:41 AM

## 2015-06-30 NOTE — Progress Notes (Signed)
PT Cancellation Note  Patient Details Name: Lynn Butler MRN: [id number] DOB: 10-Oct-1945   Cancelled Treatment:    Reason Eval/Treat Not Completed: Patient declined, no reason specified.  PT talked with nursing (about 40 minutes prior to attempting PT session) who reported pt had just started breakfast at that time and plan for pt to go to dialysis today.  Upon PT arriving, pt reporting only taking 2 bites out of breakfast and pt declining to participate in PT until she was finished eating.  Will re-attempt PT at a later date/time.   Raquel Sarna Andy Moye 06/30/2015, 9:13 AM [name], [place]

## 2015-06-30 NOTE — Progress Notes (Signed)
Per Dr Earleen Newport- hold cardizem IV give cardizem 60 mg po

## 2015-06-30 NOTE — Plan of Care (Signed)
Problem: Nutrition: Goal: Adequate nutrition will be maintained Outcome: Progressing Pain medication given with noted relief. VSS. Telemetry monitored, reported to MD elevated heart rate. Potassium 2.3, IV and oral potassium given. CT of abdomen performed.

## 2015-06-30 NOTE — Progress Notes (Signed)
PT Cancellation Note  Patient Details Name: Verlinda Kinnison MRN: [id number] DOB: 08-23-1945   Cancelled Treatment:    Reason Eval/Treat Not Completed: Patient declined, no reason specified.  Pt reporting having dialysis today, imaging for abdomen today, and also now discussion on possible [facility] discharge and feeling too overwhelmed to participate in PT (pt also reporting feeling to weak to participate in PT).  Offered encouragement and education to pt for therapy participation but pt adamantly refusing any PT at this time.  Will re-attempt PT at a later date/time.   Raquel Sarna Chanee Henrickson 06/30/2015, 11:50 AM [name], [place]

## 2015-06-30 NOTE — Progress Notes (Signed)
Palliative Care Update   Pt has been turned down for [facility] because her creatinine is under 2.0 (their cut-off for renal admission).    She has had abdominal pain and has been in and out of AFib RVR today (as per Dr. Pasty Arch note).  She is again advised about Hospice Home.   I continue to mention this because she is so very, very appropriate.  I AGAIN asked her if she had to be on dialysis for life, would she want this. She gives me the same non-answer she always gives, which is, "I don't know what to do."  And again, she focuses on going to [place]. Says that 'All the other places are not for humans --not even for animals --I will only go to [facility]'.     Her cardiac condition is not going to improve much even though her renal function seems to be returning.  Now, she is having horrible days where she is short of breath and tachycardic (even though euvolemic) and medications may not be enough to control this for long.  I continue to mention Hospice Home to patient since she does not stop me from mentioning it --she just hasn't ruled out dialysis (mostly b/c she doesn't see it as a long term need --if she had to have it for the rest of her life, she might not want this).    I have made two visits to patient today.    Colleen Can, MD

## 2015-06-30 NOTE — Care Management (Signed)
Spoke with Dr. Holley Raring. Unable to establish Lynn Butler as End Stage Renal Disease. Agree that [name] could benefit from transferring to [facility].  Spoke with daughter, [name], at the bedside. [phone]). States she is in agreement for transfer.  Dr. Leslye Peer updated and discussing case with patient and daughter. Text message to Seth Bake at [facility]. Will review chart for acceptance into [facility]. Dr. Leslye Peer is ordering CT scan of abdomen today for abdominal pain and [name] is scheduled for dialysis today Shelbie Ammons RN MSN CCM Care Management [phone]

## 2015-06-30 NOTE — Progress Notes (Signed)
Endocrinology Consult follow up:  S: Lynn Butler is a 69 y.o. female with PMH COPD, CAD, CHF, PAD, Arrhythmia, HCC, and other comorbidities who was admitted with hypoxic respiratory failure, pneumonia, and resulting septic shock. She has had a prolonged and complicated hospital course including CHF, renal failure on hemodialysis, NSTEMI. Endocrinology has been consulted for hypoglycemia  Pt reports she is in pain today. She is eating but feels like someone interrupts her everytime she tries to eat something. No hypoglycemia in the past day. She is on hydrocortisone 20 mg twice daily.  O:  Filed Vitals:   06/29/15 2100 06/30/15 1100  BP: 102/56 119/41  Pulse: 91 99  Temp: 98.6 F (37 C) 99.3 F (37.4 C)  Resp: 18 20   Physical Exam: Gen: no acute distress, sitting in bed, cachectic, appearing older than stated age 43: [place]/AT, eyes anicteric, EOMI, mucous membranes dry Neck: supple PULM: breathing unlabored on nasal canula O2 GI: abdomen distended EXT: no edema, bilateral heel pressure ulcers with dressings in place  Neuro: alert and oriented x 3  Labs:  BMP Latest Ref Rng 06/29/2015 06/28/2015 06/27/2015  Glucose 65 - 99 mg/dL 96 112(H) 86  BUN 6 - 20 mg/dL 27(H) 53(H) 44(H)  Creatinine 0.44 - 1.00 mg/dL 1.67(H) 2.82(H) 2.41(H)  Sodium 135 - 145 mmol/L 141 140 139  Potassium 3.5 - 5.1 mmol/L 3.0(L) 3.3(L) 3.7  Chloride 101 - 111 mmol/L 98(L) 97(L) 100(L)  CO2 22 - 32 mmol/L 35(H) 32 34(H)  Calcium 8.9 - 10.3 mg/dL 8.0(L) 7.8(L) 7.6(L)   Component     Latest Ref Rng 06/29/2015  Hemoglobin A1C     4.0 - 6.0 % 5.7   ASSESSMENT:  1. Hypoglycemia 2. Pre-diabetes  3. 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 If BG<70, please confirm with a serum glucose then treat per hypoglycemia protocol. Would reduce hydrocortisone to physiologic dosing: 20 mg at 8 am; 10 mg at 4 pm. She should follow up with me  outpatient for steroid taper and assessment of the HPA axis.  Will sign off.  Thank you for this consult.  Atha Starks, MD [facility] Endocrinology

## 2015-06-30 NOTE — Progress Notes (Signed)
Palliative Medicine Inpatient Consult Follow Up Note   Name: Lynn Butler Date: 06/30/2015 MRN: [id number]  DOB: 1945/10/21  Referring Physician: Loletha Grayer, MD  Palliative Care consult requested for this 69 y.o. female for goals of medical therapy in patient with an admission for multi-organ failure. She is well known to me from a previous admission not too long ago. She had lived with her ex-husband (and his girlfriend) --renting a small room in his trailer, and then most recently, she had gone to live again with her daughter, Lynn Butler, just prior to needing to come back to the hospital. She has actually been very chronically ill for a number of years and was a patient in [place] about 5 years ago. Not too long ago, she had a long term hospital stay at [facility]. While there, she had a large left forehead skin graft placed. The scab from this is shrinking since its appearance during her last admission here. She has been in and out of skilled facilities, long term hospital stays, and living with her daughter or her ex.   DISCUSSIONS AND DECISIONS: 1. DNR continues and portable DNR form is confirmed to be in the paper chart.  2.  I am notified by Care Mgr that pt will likely be approved to go to [facility] today. This is appropriate.  3.  I have spoken with Dr. Holley Raring. Pt is not formally known to be 'end-stage renal disease and dependent on hemodialysis' as yet.  Of course, [facility] can continue dializing pt until there is more clarity known about whether or not she will need this for the rest of her life --or not.    4.  Should she do poorly, she is already a Hospice Candidate, and I would encourage that this subject be brought up from time to time to keep it in the conversation.  This will make it easier to transition her to being under Hospice care when the time feels right for the patient. She almost selected this during this admission, but her long history of nearly dying and  bouncing back, has her choosing to 'fight to the bitter end'.    5.  She has stated she does NOT WANT TO BE INTUBATED OR VENTILATED AGAIN AND DOES NOT WANT A FEEDING TUBE.  Dialysis is OK with her, however.    IMPRESSION: Multi-organ system failure Acute on chronic hypoxic and hypercapneic respiratory failure --intubated and ventilated at admission --now on nasal cannula O2 at 3 LPM COPD --oxygen dependent at home Ongoing tobacco smoking (has never and will never quit she says) ---hasn't smoked since admission here however Shock--due to volume depletion and sepsis most likely  --treated with aggressive fluid resuscitation Pneumonia Elevated Liver Enzymes Elevated troponin Lactic Acidosis HTN Ischemic Cardiomyopathy  ---with EF 15-25% as seen on last echo in July of this year CAD ---severe multivessel disease PVD and PAD Vascular insufficiency foot ulcers  [place] stage 1 ulcer ---present on admission Anemia of chronic disease 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 COPD (chronic obstructive pulmonary disease) ([place])   . Hypertension   . Ischemic cardiomyopathy  a.   . Coronary artery disease     a. s/p multiple stenting in 2005  . History of ventricular tachycardia   . PVD (peripheral vascular disease) ([place])   . PAD (peripheral artery disease) ([place])   . HLD (hyperlipidemia)   . Chronic back pain   . MI, old   . MRSA (methicillin resistant staph aureus) culture positive     left foot wound  . CHF (congestive heart failure) ([place])   . Pneumonia   . Asthma   . Cancer [facility])      PAST SURGICAL HISTORY:  Past Surgical History  Procedure Laterality Date  . Insert / replace / remove pacemaker    . Vascular bypass surgery Bilateral   . Cardiac catheterization N/A 01/14/2015    Procedure: Left Heart Cath;  Surgeon: Wellington Hampshire, MD;  Location: [facility];  Service: Cardiovascular;  Laterality: N/A;  . Cardiac defibrillator placement    . Appendectomy    . Cesarean section    . Abdominal surgery      Vital Signs: BP 102/56 mmHg  Pulse 91  Temp(Src) 98.6 F (37 C) (Oral)  Resp 18  Ht 5\' 2"  (1.575 m)  Wt 48.9 kg (107 lb 12.9 oz)  BMI 19.71 kg/m2  SpO2 100% Filed Weights   06/25/15 1505 06/28/15 0945 06/28/15 1330  Weight: 56.7 kg (125 lb) 50.4 kg (111 lb 1.8 oz) 48.9 kg (107 lb 12.9 oz)    Estimated body mass index is 19.71 kg/(m^2) as calculated from the following:   Height as of this encounter: 5\' 2"  (1.575 m).   Weight as of this encounter: 48.9 kg (107 lb 12.9 oz).  PHYSICAL EXAM: NAD EOMI Alert and Oriented NO JVD or TM Hrt rrr no mgr Lungs cta but decreased BS bases Abd soft and NT Ext no mottling or cyanosis   LABS: CBC:    Component Value Date/Time   WBC 8.1 06/25/2015 1510   WBC 6.9 11/22/2014 0748   HGB 7.8* 06/27/2015 0456   HGB 10.3* 11/22/2014 0748   HCT 22.1* 06/25/2015 1510   HCT 32.9* 11/22/2014 0748   PLT 163 06/25/2015 1510   PLT 189 11/26/2014 0428   MCV 77.9* 06/25/2015 1510   MCV 72* 11/22/2014 0748   NEUTROABS 22.7* 06/13/2015 2057   NEUTROABS 4.6 11/22/2014 0748   LYMPHSABS 0.6* 06/13/2015 2057   LYMPHSABS 1.4 11/22/2014 0748   MONOABS 1.2* 06/13/2015 2057   MONOABS 0.8 11/22/2014 0748   EOSABS 0.0 06/13/2015 2057   EOSABS 0.0 11/22/2014 0748   BASOSABS 0.0 06/13/2015 2057   BASOSABS 0.0 11/22/2014 0748   Comprehensive Metabolic Panel:    Component Value Date/Time   NA 141 06/29/2015 0429   NA 139 11/25/2014 0518   K 3.0* 06/29/2015 0429   K 3.8 11/25/2014 0518   CL 98* 06/29/2015 0429    CL 103 11/25/2014 0518   CO2 35* 06/29/2015 0429   CO2 31 11/25/2014 0518   BUN 27* 06/29/2015 0429   BUN 25* 11/25/2014 0518   CREATININE 1.67* 06/29/2015 0429   CREATININE 0.66 11/25/2014 0518   GLUCOSE 96 06/29/2015 0429   GLUCOSE 83 11/25/2014 0518   CALCIUM 8.0* 06/29/2015 0429   CALCIUM 8.0* 11/25/2014 0518   AST 78* 06/16/2015 0520   AST 23 11/15/2014 1819   ALT 407* 06/16/2015 0520   ALT 14 11/15/2014 1819   ALKPHOS 147* 06/16/2015 0520   ALKPHOS 109 11/15/2014 1819   BILITOT 0.6 06/16/2015 0520  BILITOT 0.6 11/15/2014 1819   PROT 4.8* 06/16/2015 0520   PROT 7.1 11/15/2014 1819   ALBUMIN 1.9* 06/20/2015 0551   ALBUMIN 3.3* 11/15/2014 1819     More than 50% of the visit was spent in counseling/coordination of care: YES  Time Spent:  25 min

## 2015-07-01 LAB — GLUCOSE, CAPILLARY
GLUCOSE-CAPILLARY: 109 mg/dL — AB (ref 65–99)
GLUCOSE-CAPILLARY: 83 mg/dL (ref 65–99)
GLUCOSE-CAPILLARY: 91 mg/dL (ref 65–99)
GLUCOSE-CAPILLARY: 134 mg/dL — AB (ref 65–99)
GLUCOSE-CAPILLARY: 195 mg/dL — AB (ref 65–99)
Glucose-Capillary: 119 mg/dL — ABNORMAL HIGH (ref 65–99)
Glucose-Capillary: 154 mg/dL — ABNORMAL HIGH (ref 65–99)
Glucose-Capillary: 221 mg/dL — ABNORMAL HIGH (ref 65–99)
Glucose-Capillary: 82 mg/dL (ref 65–99)

## 2015-07-01 LAB — BASIC METABOLIC PANEL
ANION GAP: 8 (ref 5–15)
BUN: 32 mg/dL — ABNORMAL HIGH (ref 6–20)
CALCIUM: 7.4 mg/dL — AB (ref 8.9–10.3)
CHLORIDE: 99 mmol/L — AB (ref 101–111)
CO2: 35 mmol/L — AB (ref 22–32)
Creatinine, Ser: 1.7 mg/dL — ABNORMAL HIGH (ref 0.44–1.00)
GFR calc Af Amer: 35 mL/min — ABNORMAL LOW (ref >60)
GFR calc non Af Amer: 30 mL/min — ABNORMAL LOW (ref >60)
GLUCOSE: 88 mg/dL (ref 65–99)
Potassium: 3 mmol/L — ABNORMAL LOW (ref 3.5–5.1)
Sodium: 142 mmol/L (ref 135–145)

## 2015-07-01 LAB — MAGNESIUM: Magnesium: 1.3 mg/dL — ABNORMAL LOW (ref 1.7–2.4)

## 2015-07-01 MED ORDER — POTASSIUM CHLORIDE CRYS ER 20 MEQ PO TBCR
40.0000 meq | EXTENDED_RELEASE_TABLET | Freq: Once | ORAL | Status: AC
  Administered 2015-07-01: 40 meq via ORAL
  Filled 2015-07-01: qty 2

## 2015-07-01 MED ORDER — INSULIN ASPART 100 UNIT/ML ~~LOC~~ SOLN
0.0000 [IU] | Freq: Three times a day (TID) | SUBCUTANEOUS | Status: DC
  Administered 2015-07-02: 1 [IU] via SUBCUTANEOUS
  Administered 2015-07-02: 2 [IU] via SUBCUTANEOUS
  Administered 2015-07-03 (×2): 1 [IU] via SUBCUTANEOUS
  Filled 2015-07-01 (×4): qty 1

## 2015-07-01 MED ORDER — INSULIN ASPART 100 UNIT/ML ~~LOC~~ SOLN: 0.0000 [IU] | Freq: Every day | SUBCUTANEOUS | Status: DC

## 2015-07-01 MED ORDER — CIPROFLOXACIN HCL 500 MG PO TABS
250.0000 mg | ORAL_TABLET | Freq: Two times a day (BID) | ORAL | Status: DC
  Administered 2015-07-01 – 2015-07-05 (×9): 250 mg via ORAL
  Filled 2015-07-01: qty 1
  Filled 2015-07-01: qty 2
  Filled 2015-07-01 (×6): qty 1
  Filled 2015-07-01: qty 2

## 2015-07-01 MED ORDER — METRONIDAZOLE 500 MG PO TABS
250.0000 mg | ORAL_TABLET | Freq: Three times a day (TID) | ORAL | Status: DC
  Administered 2015-07-01 – 2015-07-05 (×13): 250 mg via ORAL
  Filled 2015-07-01 (×13): qty 1

## 2015-07-01 MED ORDER — MAGNESIUM SULFATE 2 GM/50ML IV SOLN
2.0000 g | Freq: Once | INTRAVENOUS | Status: AC
  Administered 2015-07-01: 2 g via INTRAVENOUS
  Filled 2015-07-01: qty 50

## 2015-07-01 MED ORDER — PANTOPRAZOLE SODIUM 40 MG PO TBEC
40.0000 mg | DELAYED_RELEASE_TABLET | Freq: Two times a day (BID) | ORAL | Status: DC
  Administered 2015-07-01 – 2015-07-05 (×9): 40 mg via ORAL
  Filled 2015-07-01 (×9): qty 1

## 2015-07-01 NOTE — Clinical Social Work Note (Signed)
Pt will have a bed at [facility] at discharge. Per MD, likely Monday. CSW will continue to follow.   [name], MSW, LCSW Clinical Social Worker [phone]

## 2015-07-01 NOTE — Plan of Care (Signed)
Problem: Safety: Goal: Ability to remain free from injury will improve Outcome: Progressing Patient is high fall risk and has remained in bed throughout shift.  Bed in lowest position with wheels locked and call bell within reach.  Bed alarm on throughout shift.  Patient compliant with calling for assistance.  Patient free from injury this shift.    Problem: Pain Managment: Goal: General experience of comfort will improve Outcome: Progressing Pain management on going for chronic back pain. Pain evaluated q 4 hrs.  Dilaudid given for pain rated 10/10.  Patient sleeping comfortably upon reassessment.  Problem: Skin Integrity: Goal: Risk for impaired skin integrity will decrease Outcome: Progressing Heels floated and foot of bed elevated.  Wound care provided to left foot.  Heels boggy - foam to heels and to sacrum for protection.  Patient turned/repositioned every two hours.    Problem: Nutrition: Goal: Adequate nutrition will be maintained Outcome: Progressing Pt on regular diet. No complaints of nausea this shift.  Patient taking pills whole with ensure.

## 2015-07-01 NOTE — Plan of Care (Signed)
Problem: Nutrition: Goal: Adequate nutrition will be maintained Outcome: Progressing Pain medication given with noted relief. VSS. Continues on 3L oxygen. Dressing changed to left ankle. Telemetry monitored.

## 2015-07-01 NOTE — Progress Notes (Signed)
[facility]  ROUNDING NOTE   Subjective:  Renal function appears to be improving.  Difficult to ascertain exact urine outpt.  Cr down to 1.70.   K still low at 3.0.  Objective:  Vital signs in last 24 hours:  Temp:  [98 F (36.7 C)-98.5 F (36.9 C)] 98.1 F (36.7 C) (12/02 0525) Pulse Rate:  [77-84] 84 (12/02 0525) BP: (101-105)/(50-61) 101/61 mmHg (12/02 0525) SpO2:  [98 %-100 %] 98 % (12/02 0525)  Weight change:  Filed Weights   06/25/15 1505 06/28/15 0945 06/28/15 1330  Weight: 56.7 kg (125 lb) 50.4 kg (111 lb 1.8 oz) 48.9 kg (107 lb 12.9 oz)    Intake/Output: I/O last 3 completed shifts: In: 275 [P.O.:275] Out: -    Intake/Output this shift:  Total I/O In: 25 [P.O.:25] Out: -   Physical Exam: General: Chronically ill appearing  Head: Healing wound on forehead  ENT: Moist oral mucus membranes  Neck: Supple  Lungs:  Bilateral rhonchi, normal effort  Heart: S1S2 no rubs  Abdomen:  Soft, nontender, BS present   Extremities:  trace peripheral edema.  Neurologic: Awake, alert, following commands  Skin: Large forehead scar from prior skin cancer  Access: Left femoral non-tunneled catheter    Basic Metabolic Panel:  Recent Labs Lab 06/25/15 1510 06/27/15 0456 06/28/15 0826 06/29/15 0429 06/30/15 1345 07/01/15 0557  NA  --  139 140 141 138 142  K  --  3.7 3.3* 3.0* 2.3* 3.0*  CL  --  100* 97* 98* 93* 99*  CO2  --  34* 32 35* 36* 35*  GLUCOSE  --  86 112* 96 133* 88  BUN  --  44* 53* 27* 28* 32*  CREATININE  --  2.41* 2.82* 1.67* 1.83* 1.70*  CALCIUM  --  7.6* 7.8* 8.0* 7.2* 7.4*  MG  --   --   --   --   --  1.3*  PHOS 3.2  --   --   --   --   --     Liver Function Tests: No results for input(s): AST, ALT, ALKPHOS, BILITOT, PROT, ALBUMIN in the last 168 hours. No results for input(s): LIPASE, AMYLASE in the last 168 hours. No results for input(s): AMMONIA in the last 168 hours.  CBC:  Recent Labs Lab 06/25/15 0554 06/25/15 1510  06/27/15 0456 06/30/15 1345  WBC  --  8.1  --  5.1  HGB 7.6* 6.9* 7.8* 8.7*  HCT  --  22.1*  --  27.2*  MCV  --  77.9*  --  77.6*  PLT  --  163  --  151    Cardiac Enzymes: No results for input(s): CKTOTAL, CKMB, CKMBINDEX, TROPONINI in the last 168 hours.  BNP: Invalid input(s): POCBNP  CBG:  Recent Labs Lab 07/01/15 0231 07/01/15 0513 07/01/15 0731 07/01/15 1039 07/01/15 1213  GLUCAP 109* 83 91 134* 119*    Microbiology: Results for orders placed or performed during the hospital encounter of 06/10/15  Urine culture     Status: None   Collection Time: 06/10/15  7:30 PM  Result Value Ref Range Status   Specimen Description URINE, RANDOM  Final   Special Requests NONE  Final   Culture NO GROWTH 2 DAYS  Final   Report Status 06/12/2015 FINAL  Final  Blood Culture (routine x 2)     Status: None   Collection Time: 06/10/15  8:10 PM  Result Value Ref Range Status   Specimen Description  BLOOD RIGHT  Final   Special Requests BOTTLES DRAWN AEROBIC AND ANAEROBIC 5CC  Final   Culture  Setup Time   Final    GRAM POSITIVE COCCI AEROBIC BOTTLE ONLY CRITICAL RESULT CALLED TO, READ BACK BY AND VERIFIED WITH: RN Kathi Simpers 06/14/15 1358    Culture   Final    COAGULASE NEGATIVE STAPHYLOCOCCUS Results consistent with contamination.    Report Status 06/16/2015 FINAL  Final  Blood Culture (routine x 2)     Status: None   Collection Time: 06/10/15  8:20 PM  Result Value Ref Range Status   Specimen Description BLOOD RIGHT  Final   Special Requests BOTTLES DRAWN AEROBIC AND ANAEROBIC 2CC AERO, 4CC  Final   Culture NO GROWTH 5 DAYS  Final   Report Status 06/15/2015 FINAL  Final  MRSA PCR Screening     Status: Abnormal   Collection Time: 06/11/15  1:40 AM  Result Value Ref Range Status   MRSA by PCR POSITIVE (A) NEGATIVE Final    Comment:        The GeneXpert MRSA Assay (FDA approved for NASAL specimens only), is one component of a comprehensive MRSA  colonization surveillance program. It is not intended to diagnose MRSA infection nor to guide or monitor treatment for MRSA infections. CRITICAL RESULT CALLED TO, READ BACK BY AND VERIFIED WITH: [facility] ALLEN AT [id number] 06/11/15.PMH   Culture, sputum-assessment     Status: None   Collection Time: 06/11/15  4:30 AM  Result Value Ref Range Status   Specimen Description TRACHEAL ASPIRATE  Final   Special Requests NONE  Final   Sputum evaluation THIS SPECIMEN IS ACCEPTABLE FOR SPUTUM CULTURE  Final   Report Status 06/11/2015 FINAL  Final  Culture, respiratory (NON-Expectorated)     Status: None   Collection Time: 06/11/15  4:30 AM  Result Value Ref Range Status   Specimen Description TRACHEAL ASPIRATE  Final   Special Requests NONE Reflexed from [id number]  Final   Gram Stain   Final    GOOD SPECIMEN - 80-90% WBCS MODERATE WBC SEEN MODERATE GRAM POSITIVE COCCI RARE GRAM NEGATIVE RODS    Culture HEAVY GROWTH STREPTOCOCCUS PNEUMONIAE  Final   Report Status 06/13/2015 FINAL  Final   Organism ID, Bacteria STREPTOCOCCUS PNEUMONIAE  Final      Susceptibility   Streptococcus pneumoniae - MIC*    ERYTHROMYCIN <=0.12 SENSITIVE Sensitive     VANCOMYCIN 0.5 SENSITIVE Sensitive     TRIMETH/SULFA <=10 SENSITIVE Sensitive     CLINDAMYCIN <=0.25 SENSITIVE Sensitive     LEVOFLOXACIN Value in next row Sensitive      SENSITIVE1    LINEZOLID Value in next row Sensitive      SENSITIVE<=2    * HEAVY GROWTH STREPTOCOCCUS PNEUMONIAE  C difficile quick scan w PCR reflex     Status: None   Collection Time: 06/20/15  4:30 AM  Result Value Ref Range Status   C Diff antigen NEGATIVE NEGATIVE Final   C Diff toxin NEGATIVE NEGATIVE Final   C Diff interpretation Negative for C. difficile  Final  C difficile quick scan w PCR reflex     Status: None   Collection Time: 06/29/15 10:27 AM  Result Value Ref Range Status   C Diff antigen NEGATIVE NEGATIVE Final   C Diff toxin NEGATIVE NEGATIVE Final   C Diff  interpretation VALID  Final    Coagulation Studies: No results for input(s): LABPROT, INR in the last 72 hours.  Urinalysis: No  results for input(s): COLORURINE, LABSPEC, [place], GLUCOSEU, HGBUR, BILIRUBINUR, KETONESUR, PROTEINUR, UROBILINOGEN, NITRITE, LEUKOCYTESUR in the last 72 hours.  Invalid input(s): APPERANCEUR    Imaging: Ct Abdomen Pelvis Wo Contrast  06/30/2015  CLINICAL DATA:  Abdomen pain, weight loss and poor appetite. EXAM: CT ABDOMEN AND PELVIS WITHOUT CONTRAST TECHNIQUE: Multidetector CT imaging of the abdomen and pelvis was performed following the standard protocol without IV contrast. COMPARISON:  June 10 2015 FINDINGS: The liver is, spleen, pancreas, adrenal glands are unremarkable. Patient status post prior cholecystectomy. Small nonobstructing stones are identified in both kidneys. There is no hydronephrosis bilaterally. There is atherosclerosis of the abdominal aorta without aneurysmal dilatation. There is no abdominal lymphadenopathy. The stomach is distended with question thick wall in the fundus of stomach. There is no small bowel obstruction. In the right mid to the lower abdomen, there is a colonic loop of bowel with abnormal thickened bowel wall. The visualized colonic loops in the anterior midline abdomen are dilated measuring 5.6 cm. The bladder is fluid filled without abnormality. The uterus is normal. 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 IMPRESSION: In the right mid to lower abdomen, there is a colonic loop of bowel with abnormal thickened wall. The visualized colonic loops in the anterior midline abdomen are dilated measuring 5.6 cm. The findings are nonspecific but can be seen in colitis. However, colonic neoplasm can also have this appearance. Question bowel wall thickening in the fundus of stomach. Electronically Signed   By: Abelardo Diesel M.D.   On: 06/30/2015  16:48     Medications:     . aspirin  81 mg Oral Daily  . atorvastatin  40 mg Oral q1800  . budesonide  0.25 mg Nebulization BID  . ciprofloxacin  250 mg Oral BID  . diltiazem  30 mg Oral 4 times per day  . feeding supplement (ENSURE ENLIVE)  237 mL Oral TID  . furosemide  40 mg Oral Daily  . hydrocortisone  10 mg Oral Daily  . hydrocortisone  20 mg Oral Daily  . ipratropium-albuterol  3 mL Nebulization TID  . metroNIDAZOLE  250 mg Oral 3 times per day  . oxyCODONE  80 mg Oral Q12H  . pantoprazole  40 mg Oral BID  . sodium chloride  10-40 mL Intracatheter Q12H  . sodium chloride  3 mL Intravenous Q12H   sodium chloride, sodium chloride, acetaminophen **OR** [DISCONTINUED] acetaminophen, alteplase, diazepam, diphenoxylate-atropine, glycopyrrolate, guaiFENesin, heparin, HYDROmorphone, lidocaine (PF), lidocaine-prilocaine, morphine injection, pentafluoroprop-tetrafluoroeth, polyvinyl alcohol, promethazine, senna-docusate, sodium chloride, sodium chloride, zolpidem  Assessment/ Plan:  69 y.o. female  with a PMHx of COPD, hypertension, ischemic cardiomyopathy with multiple coronary artery stents, history of ventricular tachycardia, peripheral vascular disease, hyperlipidemia, chronic back pain, history of congestive heart failure, who was admitted to [facility] on 06/10/2015 for evaluation of respiratory failure.   1. Acute renal failure secondary to acute tubular necrosis. Patient presented with multiorgan failure upon presentation. Baseline creatinine 0.75. - renal function appears to be improving overall.  No acute indication for dialysis at this time.  Continue to monitor renal function. We will consider discontinuation of femoral dialysis catheter this weekend.    2. Hyperkalemia.   -- resolved with HD, in fact K low now.    3. Acute respiratory failure.  - improved after recent decompensation, no further need for HD or UF however.  4. Acute myocardial infarction.  Appears  resolved.  5.  Anemia unspecified: Hgb 8.7, no indication for transfusion, if renal failure persists, could consider epogen as outpt.  LOS: [place] 12/2/20163:55 PM

## 2015-07-01 NOTE — Progress Notes (Signed)
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  Hospital course additionally significant for intubatin 11/11-11/20 and CRRT 11/14-11/21 with L temp fem cath still in place.    PT Comments    Treatment session limited d/t temporary L fem cath still in place.  Pt appearing very friendly with MD and [employer] representative that stopped in during session but not very receptive to PT in general.  Pt initially not wanting to participate in any PT but after therapist was going to leave pt suddenly stated she would participate but only as much as she wanted to.  Pt performed ex's in bed (pt stated that she has already been doing this on her own) and reported she would be more excited to get out of bed and transfer to commode than perform bed level ex's.  Will progress and assess OOB functional mobility when L temporary fem cath removed.   Follow Up Recommendations  SNF     Equipment Recommendations       Recommendations for Other Services       Precautions / Restrictions Precautions Precautions: Fall Precaution Comments: contact isolation, sacral skin breakdown, L temp fem cath Restrictions Weight Bearing Restrictions: No    Mobility  Bed Mobility               General bed mobility comments: deferred secondary to placement of L temp fem cath  Transfers                 General transfer comment: deferred secondary to placement of L temp fem cath  Ambulation/Gait             General Gait Details: deferred secondary to placement of L temp fem cath   Stairs            Wheelchair Mobility    Modified Rankin (Stroke Patients Only)        Balance                                    Cognition Arousal/Alertness: Awake/alert Behavior During Therapy: WFL for tasks assessed/performed Overall Cognitive Status: Within Functional Limits for tasks assessed                      Exercises   Performed semi-supine LE therapeutic exercise x 10 reps:  Ankle pumps (AROM B LE's); quad sets x3 second holds (AROM B LE's); SAQ's (AROM R); heelslides (AROM R), hip abd/adduction (AROM R), and SLR (AROM R x4 reps--limited d/t pt reporting pain on her "bottom" with these ex's).  Pt required minimal vc's and tactile cues for correct technique with exercises.     General Comments   Nursing cleared pt for participation in physical therapy.       Pertinent Vitals/Pain Pain Assessment: 0-10 Pain Score: 5  Pain Location: abdominal Pain Descriptors / Indicators: Aching;Tender Pain Intervention(s): Limited activity within patient's tolerance;Monitored during session    Home Living                      Prior Function  PT Goals (current goals can now be found in the care plan section) Acute Rehab PT Goals Patient Stated Goal: "to get out of here" PT Goal Formulation: With patient Time For Goal Achievement: 07/06/15 Potential to Achieve Goals: Fair Additional Goals Additional Goal #1: Assess and establish goals for unsupported sitting and OOB as appropriate. Progress towards PT goals: Progressing toward goals (with LE strengthening)    Frequency  Min 2X/week    PT Plan Current plan remains appropriate    Co-evaluation             End of Session Equipment Utilized During Treatment: Oxygen Activity Tolerance: Patient tolerated treatment well;No increased pain Patient left: in bed;with call bell/phone within reach;with bed alarm set     Time: 1355-1410 PT Time Calculation (min) (ACUTE ONLY): 15 min  Charges:  $Therapeutic Exercise: 8-22 mins                    G CodesLeitha Bleak July 05, 2015, 2:48 PM [name], [place]

## 2015-07-01 NOTE — Progress Notes (Signed)
Patient ID: Lynn Butler, female   DOB: 14-Oct-1945, 69 y.o.   MRN: [id number] [facility] PROGRESS NOTE  PCP: Juanell Fairly, MD  HPI/Subjective: Patient overall feeling better since when she came in but still having abdominal pain. Still having some diarrhea. Still with nausea. Still with cough and shortness of breath. Chronic back pain.   Objective: Filed Vitals:   06/30/15 2115 07/01/15 0525  BP: 105/50 101/61  Pulse: 77 84  Temp: 98.5 F (36.9 C) 98.1 F (36.7 C)  Resp:      Filed Weights   06/25/15 1505 06/28/15 0945 06/28/15 1330  Weight: 56.7 kg (125 lb) 50.4 kg (111 lb 1.8 oz) 48.9 kg (107 lb 12.9 oz)    ROS: Review of Systems  Constitutional: Negative for fever and chills.  Eyes: Negative for blurred vision.  Respiratory: Positive for cough and shortness of breath.   Cardiovascular: Negative for chest pain.  Gastrointestinal: Positive for nausea, abdominal pain and diarrhea. Negative for vomiting and constipation.  Genitourinary: Negative for dysuria.  Musculoskeletal: Positive for back pain. Negative for joint pain.  Neurological: Positive for tremors. Negative for dizziness and headaches.   Exam: Physical Exam  Constitutional: She is oriented to person, place, and time.  HENT:  Nose: No mucosal edema.  Mouth/Throat: No oropharyngeal exudate or posterior oropharyngeal edema.  Eyes: Conjunctivae, EOM and lids are normal. Pupils are equal, round, and reactive to light.  Neck: No JVD present. Carotid bruit is not present. No edema present. No thyroid mass and no thyromegaly present.  Cardiovascular: S1 normal and S2 normal.  Exam reveals no gallop.   No murmur heard. Pulses:      Dorsalis pedis pulses are 0 on the right side, and 0 on the left side.  Respiratory: No accessory muscle usage. No respiratory distress. She has decreased breath sounds in the right lower field and the left lower field. She has wheezes in the right lower field. She has no  rhonchi. She has rales in the right lower field and the left lower field.  GI: Soft. Bowel sounds are normal. There is generalized tenderness.  Musculoskeletal:       Right ankle: She exhibits no swelling.       Left ankle: She exhibits no swelling.  Lymphadenopathy:    She has no cervical adenopathy.  Neurological: She is alert and oriented to person, place, and time. No cranial nerve deficit.  Skin: Skin is warm. Nails show no clubbing.  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 Skin graft on  forehead with scab. Sacral decubiti stage II.  Psychiatric: She has a normal mood and affect.    Data Reviewed: Basic Metabolic Panel:  Recent Labs Lab 06/25/15 1510 06/27/15 0456 06/28/15 0826 06/29/15 0429 06/30/15 1345 07/01/15 0557  NA  --  139 140 141 138 142  K  --  3.7 3.3* 3.0* 2.3* 3.0*  CL  --  100* 97* 98* 93* 99*  CO2  --  34* 32 35* 36* 35*  GLUCOSE  --  86 112* 96 133* 88  BUN  --  44* 53* 27* 28* 32*  CREATININE  --  2.41* 2.82* 1.67* 1.83* 1.70*  CALCIUM  --  7.6* 7.8* 8.0* 7.2* 7.4*  MG  --   --   --   --   --  1.3*  PHOS 3.2  --   --   --   --   --    I just CBC:  Recent  Labs Lab 06/25/15 0554 06/25/15 1510 06/27/15 0456 06/30/15 1345  WBC  --  8.1  --  5.1  HGB 7.6* 6.9* 7.8* 8.7*  HCT  --  22.1*  --  27.2*  MCV  --  77.9*  --  77.6*  PLT  --  163  --  151     Recent Results (from the past 240 hour(s))  C difficile quick scan w PCR reflex     Status: None   Collection Time: 06/29/15 10:27 AM  Result Value Ref Range Status   C Diff antigen NEGATIVE NEGATIVE Final   C Diff toxin NEGATIVE NEGATIVE Final   C Diff interpretation VALID  Final    Scheduled Meds: . aspirin  81 mg Oral Daily  . atorvastatin  40 mg Oral q1800  . budesonide  0.25 mg Nebulization BID  . ciprofloxacin  250 mg Oral BID  . diltiazem  30 mg Oral 4 times per day  . feeding supplement (ENSURE ENLIVE)  237 mL Oral TID  . furosemide  40 mg Oral Daily  . hydrocortisone   10 mg Oral Daily  . hydrocortisone  20 mg Oral Daily  . ipratropium-albuterol  3 mL Nebulization TID  . metroNIDAZOLE  250 mg Oral 3 times per day  . oxyCODONE  80 mg Oral Q12H  . pantoprazole  40 mg Oral BID  . sodium chloride  10-40 mL Intracatheter Q12H  . sodium chloride  3 mL Intravenous Q12H    Assessment/Plan:  1. Paroxysmal atrial fibrillation. Continue oral Cardizem. Not a great candidate for continuous anticoagulation.  2. Abdominal pain - CT scan showed colitis. Not sure if this is infectious versus ischemic colitis. I will start empiric Cipro and Flagyl. Patient is not a good candidate for surgery or colonoscopy. Continue pain medication. I added Protonix also for inflammation around the upper abdomen 3. Acute systolic congestive heart failure.  No beta blocker with bronchospasm. No ACE inhibitor with borderline renal function. Decreased dose of Lasix to 40 mg daily. 4. Septic shock secondary to pneumonia. Sputum culture grew out Streptococcus pneumoniae. Patient finished treatment with IV Rocephin. 5. Hypoglycemia- appreciate endocrine consult. Patient is not a diabetic. Sugars are trending better. For possible adrenal insufficiency on hydrocortisone 20 mg in the morning, 10 mg in the p.m. 6. Acute respiratory failure, COPD exacerbation and pneumonia and now heart failure. Patient does not want further intubation or BiPAP. Patient extubated 11/21. Patient on chronic oxygen at home. Continue nebulizer treatments. 7. Acute renal failure. Creatinine slowly improving. Still has dialysis catheter in right groin. Hopefully can potentially get out to rehabilitation next week. 8. Acute myocardial infarction NSTEMI- aspirin, statin and medical management. No beta blocker with bronchospasm and end-stage COPD 9. Acute on chronic anemia- hemoglobin 7.8 10. Chronic pain syndrome on oxycodone and dilaudid and Valium 11. Weakness- Will likely need long-term care 12. Diarrhea- repeat stool for C.  difficile is negative. 13. Nausea- Phenergan as needed for nausea.  Code Status:     Code Status Orders        Start     Ordered   06/19/15 1140  Do not attempt resuscitation (DNR)   Continuous    Question Answer Comment  In the event of cardiac or respiratory ARREST Do not call a "code blue"   In the event of cardiac or respiratory ARREST Do not perform Intubation, CPR, defibrillation or ACLS   In the event of cardiac or respiratory ARREST Use medication by any route,  position, wound care, and other measures to relive pain and suffering. May use oxygen, suction and manual treatment of airway obstruction as needed for comfort.      06/19/15 1140      Disposition Plan: Will need long-term care   Consultants:  Critical care specialist  Nephrology  Palliative care team  Time spent: 20 minutes.  Loletha Grayer  [facility] [place] [facility]

## 2015-07-01 NOTE — Care Management Important Message (Signed)
Important Message  Patient Details  Name: Lynn Butler MRN: [id number] Date of Birth: June 19, 1946   Medicare Important Message Given:  Yes    Shelbie Ammons, RN 07/01/2015, 10:16 AM

## 2015-07-01 NOTE — Progress Notes (Signed)
Speech Therapy Note: reviewed chart notes and consulted pt/NSG re: toleration of diet. Pt is eating a fairly regular diet (cheeseburger at lunch today) w/ no overt s/s of aspiration reported; no deficits when taking pills per NSG. No decline in pulmonary status per MD notes. Pt was given educational information re: eating w/ COPD and general aspiration precautions. No further skilled ST services indicated at this time. NSG to reconsult if any change in status. NSG agreed.

## 2015-07-01 NOTE — Progress Notes (Signed)
Palliative Medicine Inpatient Consult Follow Up Note   Name: Lynn Butler Date: 07/01/2015 MRN: [id number]  DOB: [birth year]  Referring Physician: Loletha Grayer, MD  Palliative Care consult requested for this 69 y.o. female for goals of medical therapy in patient with an admission for multi-organ failure. She is well known to me from a previous admission not too long ago. She had lived with her ex-husband (and his girlfriend) --renting a small room in his trailer, and then most recently, she had gone to live again with her daughter, Lynn Butler, just prior to needing to come back to the hospital. She has actually been very chronically ill for a number of years and was a patient in [place] about 5 years ago. Not too long ago, she had a long term hospital stay at [place]. While there, she had a large left forehead skin graft placed. The scab from this is shrinking since its appearance during her last admission here. She has been in and out of skilled facilities, long term hospital stays, and living with her daughter or her ex.She has Medicare and Medicaid insurance, but does not ever get to stay in a SNF under Medicaid b/c the facilities always have required extra payment in addition to the Medicaid fee --per daughter and pt.   DISCUSSIONS AND DECISIONS. She had a CT of abdomen yesterday showing some abnormalities of the colon.  See Ct report below in this note.  Will wait until her attending has talked with her and then visit as there may be wishes to pursue OR not pursue a work-up for this on the part of the pt.    I have spoken with Dr. Holley Raring, nephrologist.  He states that her creatinine has improved significantly, and it now appears that she is not going to be an ESRD pt, so the temporary HD catheter will likely come out soon.   -----------------------------------------------------------------------------------  IMPRESSION: Multi-organ system failure Acute on chronic hypoxic and  hypercapneic respiratory failure --intubated and ventilated at admission --now on nasal cannula O2 at 3 LPM COPD --oxygen dependent at home Ongoing tobacco smoking (has never and will never quit she says) ---hasn't smoked since admission here however Shock--due to volume depletion and sepsis most likely  --treated with aggressive fluid resuscitation Pneumonia Elevated Liver Enzymes Elevated troponin Lactic Acidosis HTN Ischemic Cardiomyopathy  ---with EF 15-25% as seen on last echo in July of this year CAD ---severe multivessel disease PVD and PAD Vascular insufficiency foot ulcers  Sacral stage 1 ulcer ---present on admission Anemia of chronic disease Skin cancer of left side of forehead --treated with skin graft while she was at [facility] MRSA colonization left foot wound Chronic severe back pain on high dose pain Rxs H/O V Tachnt (?and removal?) and defibrillator placement Severe Malnutrition due to chronic illness and loss of appetite H/O cardiac pacer placement Insomnia Anxiety Abdominal Pain Abnormal CT Abdomen with bowel dilation and wall-thickening Afib rvr     CODE STATUS: DNR   PAST MEDICAL HISTORY: Past Medical History  Diagnosis Date  . COPD (chronic obstructive pulmonary disease) ([place])   . Hypertension   . Ischemic cardiomyopathy     a.   . Coronary artery disease     a. s/p multiple stenting in 2005  . History of ventricular tachycardia   . PVD (peripheral vascular disease) ([place])   . PAD (peripheral artery disease) ([place])   . HLD (hyperlipidemia)   . Chronic back pain   . MI, old   . MRSA (  methicillin resistant staph aureus) culture positive     left foot wound  . CHF (congestive heart failure) ([place])   . Pneumonia   . Asthma   . Cancer [facility])     PAST SURGICAL HISTORY:  Past Surgical History  Procedure Laterality Date  . Insert / replace / remove pacemaker    . Vascular bypass surgery Bilateral   . Cardiac catheterization N/A  01/14/2015    Procedure: Left Heart Cath;  Surgeon: Wellington Hampshire, MD;  Location: [place] [facility];  Service: Cardiovascular;  Laterality: N/A;  . Cardiac defibrillator placement    . Appendectomy    . Cesarean section    . Abdominal surgery      Vital Signs: BP 101/61 mmHg  Pulse 84  Temp(Src) 98.1 F (36.7 C) (Oral)  Resp 20  Ht 5\' 2"  (1.575 m)  Wt 48.9 kg (107 lb 12.9 oz)  BMI 19.71 kg/m2  SpO2 98% Filed Weights   06/25/15 1505 06/28/15 0945 06/28/15 1330  Weight: 56.7 kg (125 lb) 50.4 kg (111 lb 1.8 oz) 48.9 kg (107 lb 12.9 oz)    Estimated body mass index is 19.71 kg/(m^2) as calculated from the following:   Height as of this encounter: 5\' 2"  (1.575 m).   Weight as of this encounter: 48.9 kg (107 lb 12.9 oz).  PHYSICAL EXAM: NAD EOMI OP clear No JVD or TM Hrt rrr no mgr Lungs cta ant Abd soft and NT     LABS: CBC:    Component Value Date/Time   WBC 5.1 06/30/2015 1345   WBC 6.9 11/22/2014 0748   HGB 8.7* 06/30/2015 1345   HGB 10.3* 11/22/2014 0748   HCT 27.2* 06/30/2015 1345   HCT 32.9* 11/22/2014 0748   PLT 151 06/30/2015 1345   PLT 189 11/26/2014 0428   MCV 77.6* 06/30/2015 1345   MCV 72* 11/22/2014 0748   NEUTROABS 22.7* 06/13/2015 2057   NEUTROABS 4.6 11/22/2014 0748   LYMPHSABS 0.6* 06/13/2015 2057   LYMPHSABS 1.4 11/22/2014 0748   MONOABS 1.2* 06/13/2015 2057   MONOABS 0.8 11/22/2014 0748   EOSABS 0.0 06/13/2015 2057   EOSABS 0.0 11/22/2014 0748   BASOSABS 0.0 06/13/2015 2057   BASOSABS 0.0 11/22/2014 0748   Comprehensive Metabolic Panel:    Component Value Date/Time   NA 142 07/01/2015 0557   NA 139 11/25/2014 0518   K 3.0* 07/01/2015 0557   K 3.8 11/25/2014 0518   CL 99* 07/01/2015 0557   CL 103 11/25/2014 0518   CO2 35* 07/01/2015 0557   CO2 31 11/25/2014 0518   BUN 32* 07/01/2015 0557   BUN 25* 11/25/2014 0518   CREATININE 1.70* 07/01/2015 0557   CREATININE 0.66 11/25/2014 0518   GLUCOSE 88 07/01/2015 0557   GLUCOSE  83 11/25/2014 0518   CALCIUM 7.4* 07/01/2015 0557   CALCIUM 8.0* 11/25/2014 0518   AST 78* 06/16/2015 0520   AST 23 11/15/2014 1819   ALT 407* 06/16/2015 0520   ALT 14 11/15/2014 1819   ALKPHOS 147* 06/16/2015 0520   ALKPHOS 109 11/15/2014 1819   BILITOT 0.6 06/16/2015 0520   BILITOT 0.6 11/15/2014 1819   PROT 4.8* 06/16/2015 0520   PROT 7.1 11/15/2014 1819   ALBUMIN 1.9* 06/20/2015 0551   ALBUMIN 3.3* 11/15/2014 1819    Time Spent: 25  min

## 2015-07-02 LAB — BASIC METABOLIC PANEL
Anion gap: 7 (ref 5–15)
BUN: 32 mg/dL — AB (ref 6–20)
CHLORIDE: 97 mmol/L — AB (ref 101–111)
CO2: 37 mmol/L — AB (ref 22–32)
CREATININE: 1.62 mg/dL — AB (ref 0.44–1.00)
Calcium: 6.9 mg/dL — ABNORMAL LOW (ref 8.9–10.3)
GFR calc Af Amer: 37 mL/min — ABNORMAL LOW (ref >60)
GFR calc non Af Amer: 32 mL/min — ABNORMAL LOW (ref >60)
Glucose, Bld: 86 mg/dL (ref 65–99)
POTASSIUM: 2.4 mmol/L — AB (ref 3.5–5.1)
SODIUM: 141 mmol/L (ref 135–145)

## 2015-07-02 LAB — HEMOGLOBIN: HEMOGLOBIN: 8.2 g/dL — AB (ref 12.0–16.0)

## 2015-07-02 LAB — GLUCOSE, CAPILLARY
GLUCOSE-CAPILLARY: 85 mg/dL (ref 65–99)
GLUCOSE-CAPILLARY: 135 mg/dL — AB (ref 65–99)
GLUCOSE-CAPILLARY: 134 mg/dL — AB (ref 65–99)
Glucose-Capillary: 175 mg/dL — ABNORMAL HIGH (ref 65–99)

## 2015-07-02 MED ORDER — METOCLOPRAMIDE HCL 5 MG/ML IJ SOLN
5.0000 mg | Freq: Three times a day (TID) | INTRAMUSCULAR | Status: DC
  Administered 2015-07-02 – 2015-07-05 (×10): 5 mg via INTRAVENOUS
  Filled 2015-07-02 (×10): qty 2

## 2015-07-02 MED ORDER — POTASSIUM CHLORIDE CRYS ER 20 MEQ PO TBCR
40.0000 meq | EXTENDED_RELEASE_TABLET | Freq: Two times a day (BID) | ORAL | Status: DC
  Administered 2015-07-02 (×2): 40 meq via ORAL
  Filled 2015-07-02 (×2): qty 2

## 2015-07-02 MED ORDER — POTASSIUM CHLORIDE 10 MEQ/100ML IV SOLN
10.0000 meq | INTRAVENOUS | Status: AC
  Administered 2015-07-02 (×2): 10 meq via INTRAVENOUS
  Filled 2015-07-02 (×2): qty 100

## 2015-07-02 NOTE — Progress Notes (Signed)
Critical lab of K+ 2.4 called to Dr Marcille Blanco with orders received. Dorna Bloom RN

## 2015-07-02 NOTE — Progress Notes (Signed)
Patient ID: Lynn Butler, female   DOB: February 07, 1946, 69 y.o.   MRN: [id number] [facility] PROGRESS NOTE  PCP: Juanell Fairly, MD  HPI/Subjective: Patient continues to complain of nausea but was able to eat   Objective: Filed Vitals:   07/02/15 0454 07/02/15 0745  BP: 125/58 108/53  Pulse: 82 85  Temp: 98.7 F (37.1 C) 98.8 F (37.1 C)  Resp: 18 18    Filed Weights   06/25/15 1505 06/28/15 0945 06/28/15 1330  Weight: 56.7 kg (125 lb) 50.4 kg (111 lb 1.8 oz) 48.9 kg (107 lb 12.9 oz)    ROS: Review of Systems  Constitutional: Negative for fever and chills.  Eyes: Negative for blurred vision.  Respiratory: Negative for cough and shortness of breath Cardiovascular: Negative for chest pain.  Gastrointestinal: Positive for nausea, abdominal pain and diarrhea. Negative for vomiting and constipation.  Genitourinary: Negative for dysuria.  Musculoskeletal: Positive for back pain. Negative for joint pain.  Neurological: Positive for tremors. Negative for dizziness and headaches.   Exam: Physical Exam  Constitutional: She is oriented to person, place, and time.  HENT:  Nose: No mucosal edema.  Mouth/Throat: No oropharyngeal exudate or posterior oropharyngeal edema.  Eyes: Conjunctivae, EOM and lids are normal. Pupils are equal, round, and reactive to light.  Neck: No JVD present. Carotid bruit is not present. No edema present. No thyroid mass and no thyromegaly present.  Cardiovascular: S1 normal and S2 normal.  Exam reveals no gallop.   No murmur heard. Pulses:      Dorsalis pedis pulses are 0 on the right side, and 0 on the left side.  Respiratory: No accessory muscle usage. No respiratory distress. Diminished breath sounds without any rales rhonchi or wheezing GI: Soft. Bowel sounds are normal. There is generalized tenderness.  Musculoskeletal:       Right ankle: She exhibits no swelling.       Left ankle: She exhibits no swelling.  Lymphadenopathy:    She  has no cervical adenopathy.  Neurological: She is alert and oriented to person, place, and time. No cranial nerve deficit.  Skin: Skin is warm. Nails show no clubbing.  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 Skin graft on  forehead with scab. Sacral decubiti stage II.  Psychiatric: She has a normal mood and affect.    Data Reviewed: Basic Metabolic Panel:  Recent Labs Lab 06/25/15 1510  06/28/15 0826 06/29/15 0429 06/30/15 1345 07/01/15 0557 07/02/15 0450  NA  --   < > 140 141 138 142 141  K  --   < > 3.3* 3.0* 2.3* 3.0* 2.4*  CL  --   < > 97* 98* 93* 99* 97*  CO2  --   < > 32 35* 36* 35* 37*  GLUCOSE  --   < > 112* 96 133* 88 86  BUN  --   < > 53* 27* 28* 32* 32*  CREATININE  --   < > 2.82* 1.67* 1.83* 1.70* 1.62*  CALCIUM  --   < > 7.8* 8.0* 7.2* 7.4* 6.9*  MG  --   --   --   --   --  1.3*  --   PHOS 3.2  --   --   --   --   --   --   < > = values in this interval not displayed. I just CBC:  Recent Labs Lab 06/25/15 1510 06/27/15 0456 06/30/15 1345 07/02/15 0450  WBC 8.1  --  5.1  --   HGB 6.9* 7.8* 8.7* 8.2*  HCT 22.1*  --  27.2*  --   MCV 77.9*  --  77.6*  --   PLT 163  --  151  --      Recent Results (from the past 240 hour(s))  C difficile quick scan w PCR reflex     Status: None   Collection Time: 06/29/15 10:27 AM  Result Value Ref Range Status   C Diff antigen NEGATIVE NEGATIVE Final   C Diff toxin NEGATIVE NEGATIVE Final   C Diff interpretation VALID  Final    Scheduled Meds: . aspirin  81 mg Oral Daily  . atorvastatin  40 mg Oral q1800  . budesonide  0.25 mg Nebulization BID  . ciprofloxacin  250 mg Oral BID  . diltiazem  30 mg Oral 4 times per day  . feeding supplement (ENSURE ENLIVE)  237 mL Oral TID  . furosemide  40 mg Oral Daily  . hydrocortisone  10 mg Oral Daily  . hydrocortisone  20 mg Oral Daily  . insulin aspart  0-5 Units Subcutaneous QHS  . insulin aspart  0-9 Units Subcutaneous TID WC  . ipratropium-albuterol  3 mL  Nebulization TID  . metoCLOPramide (REGLAN) injection  5 mg Intravenous 3 times per day  . metroNIDAZOLE  250 mg Oral 3 times per day  . oxyCODONE  80 mg Oral Q12H  . pantoprazole  40 mg Oral BID  . potassium chloride  40 mEq Oral BID  . sodium chloride  10-40 mL Intracatheter Q12H  . sodium chloride  3 mL Intravenous Q12H    Assessment/Plan:  1. Paroxysmal atrial fibrillation. Continue oral Cardizem. Not a great candidate for continuous anticoagulation.  2. Abdominal pain - CT scan showed colitis. Not sure if this is infectious versus ischemic colitis. Continue empiric Cipro and Flagyl. Patient is not a good candidate for surgery or colonoscopy. Continue pain medication. Protonix for possible gastritis or esophagitis, due to persistent nausea try some IV Reglan will have to monitor with her diarrhea 3. Acute systolic congestive heart failure.  No beta blocker with bronchospasm. No ACE inhibitor with borderline renal function. Continue Lasix orally 4. Septic shock secondary to Streptococcus pneumoniae. Patient finished treatment with IV Rocephin. 5. Hypoglycemia- appreciate endocrine consult. Patient is not a diabetic. Sugars are trending better. For possible adrenal insufficiency on hydrocortisone 20 mg in the morning, 10 mg in the p.m. 6. Acute respiratory failure, COPD exacerbation and pneumonia and now heart failure. Patient does not want further intubation or BiPAP. Patient extubated 11/21. Continue when necessary oxygen 7. Acute renal failure. Creatinine slowly improving. Remove dialysis catheter per nephrology  8. Acute myocardial infarction NSTEMI- aspirin, statin and medical management. No beta blocker with bronchospasm and end-stage COPD 9. Acute on chronic anemia- hemoglobin stable 10. Chronic pain syndrome on oxycodone and dilaudid continue Valium 11. Weakness- Will likely need long-term care 12. Diarrhea- repeat stool for C. difficile is negative. 13. Nausea- Phenergan as needed  for nausea. 14. Severe hypokalemia replace Code Status:     Code Status Orders        Start     Ordered   06/19/15 1140  Do not attempt resuscitation (DNR)   Continuous    Question Answer Comment  In the event of cardiac or respiratory ARREST Do not call a "code blue"   In the event of cardiac or respiratory ARREST Do not perform Intubation, CPR, defibrillation or ACLS   In  the event of cardiac or respiratory ARREST Use medication by any route, position, wound care, and other measures to relive pain and suffering. May use oxygen, suction and manual treatment of airway obstruction as needed for comfort.      06/19/15 1140      Disposition Plan: Will need long-term care   Consultants:  Critical care specialist  Nephrology  Palliative care team  Time spent: 20 minutes.  Posey Pronto [facility]

## 2015-07-02 NOTE — Progress Notes (Signed)
[facility]  ROUNDING NOTE   Subjective:  Cr continues to trend down.   Currently Cr is 1.6 at the moment. Overall pt doing better. Have instructed nursing to remove dialysis catheter.  Objective:  Vital signs in last 24 hours:  Temp:  [98.7 F (37.1 C)-98.8 F (37.1 C)] 98.8 F (37.1 C) (12/03 0745) Pulse Rate:  [75-85] 85 (12/03 0745) Resp:  [18] 18 (12/03 0745) BP: (108-125)/(53-63) 108/53 mmHg (12/03 0745) SpO2:  [96 %-100 %] 96 % (12/03 0802)  Weight change:  Filed Weights   06/25/15 1505 06/28/15 0945 06/28/15 1330  Weight: 56.7 kg (125 lb) 50.4 kg (111 lb 1.8 oz) 48.9 kg (107 lb 12.9 oz)    Intake/Output: I/O last 3 completed shifts: In: 86 [P.O.:25; I.V.:13] Out: 300 [Urine:300]   Intake/Output this shift:  Total I/O In: 214 [I.V.:10; IV Piggyback:204] Out: 1 [Urine:1]  Physical Exam: General: Chronically ill appearing  Head: Healing wound on forehead  ENT: Moist oral mucus membranes  Neck: Supple  Lungs:  Bilateral rhonchi, normal effort  Heart: S1S2 no rubs  Abdomen:  Soft, nontender, BS present   Extremities:  trace peripheral edema.  Neurologic: Awake, alert, following commands  Skin: Large forehead scar from prior skin cancer  Access: Left femoral non-tunneled catheter    Basic Metabolic Panel:  Recent Labs Lab 06/25/15 1510  06/28/15 0826 06/29/15 0429 06/30/15 1345 07/01/15 0557 07/02/15 0450  NA  --   < > 140 141 138 142 141  K  --   < > 3.3* 3.0* 2.3* 3.0* 2.4*  CL  --   < > 97* 98* 93* 99* 97*  CO2  --   < > 32 35* 36* 35* 37*  GLUCOSE  --   < > 112* 96 133* 88 86  BUN  --   < > 53* 27* 28* 32* 32*  CREATININE  --   < > 2.82* 1.67* 1.83* 1.70* 1.62*  CALCIUM  --   < > 7.8* 8.0* 7.2* 7.4* 6.9*  MG  --   --   --   --   --  1.3*  --   PHOS 3.2  --   --   --   --   --   --   < > = values in this interval not displayed.  Liver Function Tests: No results for input(s): AST, ALT, ALKPHOS, BILITOT, PROT, ALBUMIN in the  last 168 hours. No results for input(s): LIPASE, AMYLASE in the last 168 hours. No results for input(s): AMMONIA in the last 168 hours.  CBC:  Recent Labs Lab 06/25/15 1510 06/27/15 0456 06/30/15 1345 07/02/15 0450  WBC 8.1  --  5.1  --   HGB 6.9* 7.8* 8.7* 8.2*  HCT 22.1*  --  27.2*  --   MCV 77.9*  --  77.6*  --   PLT 163  --  151  --     Cardiac Enzymes: No results for input(s): CKTOTAL, CKMB, CKMBINDEX, TROPONINI in the last 168 hours.  BNP: Invalid input(s): POCBNP  CBG:  Recent Labs Lab 07/01/15 1608 07/01/15 2001 07/01/15 2159 07/02/15 0722 07/02/15 1128  GLUCAP 154* 221* 195* 33 175*    Microbiology: Results for orders placed or performed during the hospital encounter of 06/10/15  Urine culture     Status: None   Collection Time: 06/10/15  7:30 PM  Result Value Ref Range Status   Specimen Description URINE, RANDOM  Final   Special Requests NONE  Final   Culture NO GROWTH 2 DAYS  Final   Report Status 06/12/2015 FINAL  Final  Blood Culture (routine x 2)     Status: None   Collection Time: 06/10/15  8:10 PM  Result Value Ref Range Status   Specimen Description BLOOD RIGHT  Final   Special Requests BOTTLES DRAWN AEROBIC AND ANAEROBIC 5CC  Final   Culture  Setup Time   Final    GRAM POSITIVE COCCI AEROBIC BOTTLE ONLY CRITICAL RESULT CALLED TO, READ BACK BY AND VERIFIED WITH: RN Kathi Simpers 06/14/15 1358    Culture   Final    COAGULASE NEGATIVE STAPHYLOCOCCUS Results consistent with contamination.    Report Status 06/16/2015 FINAL  Final  Blood Culture (routine x 2)     Status: None   Collection Time: 06/10/15  8:20 PM  Result Value Ref Range Status   Specimen Description BLOOD RIGHT  Final   Special Requests BOTTLES DRAWN AEROBIC AND ANAEROBIC 2CC AERO, 4CC  Final   Culture NO GROWTH 5 DAYS  Final   Report Status 06/15/2015 FINAL  Final  MRSA PCR Screening     Status: Abnormal   Collection Time: 06/11/15  1:40 AM  Result Value Ref  Range Status   MRSA by PCR POSITIVE (A) NEGATIVE Final    Comment:        The GeneXpert MRSA Assay (FDA approved for NASAL specimens only), is one component of a comprehensive MRSA colonization surveillance program. It is not intended to diagnose MRSA infection nor to guide or monitor treatment for MRSA infections. CRITICAL RESULT CALLED TO, READ BACK BY AND VERIFIED WITH: [facility] ALLEN AT [id number] 06/11/15.PMH   Culture, sputum-assessment     Status: None   Collection Time: 06/11/15  4:30 AM  Result Value Ref Range Status   Specimen Description TRACHEAL ASPIRATE  Final   Special Requests NONE  Final   Sputum evaluation THIS SPECIMEN IS ACCEPTABLE FOR SPUTUM CULTURE  Final   Report Status 06/11/2015 FINAL  Final  Culture, respiratory (NON-Expectorated)     Status: None   Collection Time: 06/11/15  4:30 AM  Result Value Ref Range Status   Specimen Description TRACHEAL ASPIRATE  Final   Special Requests NONE Reflexed from [id number]  Final   Gram Stain   Final    GOOD SPECIMEN - 80-90% WBCS MODERATE WBC SEEN MODERATE GRAM POSITIVE COCCI RARE GRAM NEGATIVE RODS    Culture HEAVY GROWTH STREPTOCOCCUS PNEUMONIAE  Final   Report Status 06/13/2015 FINAL  Final   Organism ID, Bacteria STREPTOCOCCUS PNEUMONIAE  Final      Susceptibility   Streptococcus pneumoniae - MIC*    ERYTHROMYCIN <=0.12 SENSITIVE Sensitive     VANCOMYCIN 0.5 SENSITIVE Sensitive     TRIMETH/SULFA <=10 SENSITIVE Sensitive     CLINDAMYCIN <=0.25 SENSITIVE Sensitive     LEVOFLOXACIN Value in next row Sensitive      SENSITIVE1    LINEZOLID Value in next row Sensitive      SENSITIVE<=2    * HEAVY GROWTH STREPTOCOCCUS PNEUMONIAE  C difficile quick scan w PCR reflex     Status: None   Collection Time: 06/20/15  4:30 AM  Result Value Ref Range Status   C Diff antigen NEGATIVE NEGATIVE Final   C Diff toxin NEGATIVE NEGATIVE Final   C Diff interpretation Negative for C. difficile  Final  C difficile quick scan w PCR  reflex     Status: None   Collection Time: 06/29/15 10:27 AM  Result Value Ref Range Status   C Diff antigen NEGATIVE NEGATIVE Final   C Diff toxin NEGATIVE NEGATIVE Final   C Diff interpretation VALID  Final    Coagulation Studies: No results for input(s): LABPROT, INR in the last 72 hours.  Urinalysis: No results for input(s): COLORURINE, LABSPEC, PHURINE, GLUCOSEU, HGBUR, BILIRUBINUR, KETONESUR, PROTEINUR, UROBILINOGEN, NITRITE, LEUKOCYTESUR in the last 72 hours.  Invalid input(s): APPERANCEUR    Imaging: Ct Abdomen Pelvis Wo Contrast  06/30/2015  CLINICAL DATA:  Abdomen pain, weight loss and poor appetite. EXAM: CT ABDOMEN AND PELVIS WITHOUT CONTRAST TECHNIQUE: Multidetector CT imaging of the abdomen and pelvis was performed following the standard protocol without IV contrast. COMPARISON:  June 10 2015 FINDINGS: The liver is, spleen, pancreas, adrenal glands are unremarkable. Patient status post prior cholecystectomy. Small nonobstructing stones are identified in both kidneys. There is no hydronephrosis bilaterally. There is atherosclerosis of the abdominal aorta without aneurysmal dilatation. There is no abdominal lymphadenopathy. The stomach is distended with question thick wall in the fundus of stomach. There is no small bowel obstruction. In the right mid to the lower abdomen, there is a colonic loop of bowel with abnormal thickened bowel wall. The visualized colonic loops in the anterior midline abdomen are dilated measuring 5.6 cm. The bladder is fluid filled without abnormality. The uterus is normal. 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 IMPRESSION: In the right mid to lower abdomen, there is a colonic loop of bowel with abnormal thickened wall. The visualized colonic loops in the anterior midline abdomen are dilated measuring 5.6 cm. The findings are nonspecific but can be seen in  colitis. However, colonic neoplasm can also have this appearance. Question bowel wall thickening in the fundus of stomach. Electronically Signed   By: Abelardo Diesel M.D.   On: 06/30/2015 16:48     Medications:     . aspirin  81 mg Oral Daily  . atorvastatin  40 mg Oral q1800  . budesonide  0.25 mg Nebulization BID  . ciprofloxacin  250 mg Oral BID  . diltiazem  30 mg Oral 4 times per day  . feeding supplement (ENSURE ENLIVE)  237 mL Oral TID  . furosemide  40 mg Oral Daily  . hydrocortisone  10 mg Oral Daily  . hydrocortisone  20 mg Oral Daily  . insulin aspart  0-5 Units Subcutaneous QHS  . insulin aspart  0-9 Units Subcutaneous TID WC  . ipratropium-albuterol  3 mL Nebulization TID  . metoCLOPramide (REGLAN) injection  5 mg Intravenous 3 times per day  . metroNIDAZOLE  250 mg Oral 3 times per day  . oxyCODONE  80 mg Oral Q12H  . pantoprazole  40 mg Oral BID  . potassium chloride  40 mEq Oral BID  . sodium chloride  10-40 mL Intracatheter Q12H  . sodium chloride  3 mL Intravenous Q12H   sodium chloride, sodium chloride, acetaminophen **OR** [DISCONTINUED] acetaminophen, alteplase, diazepam, diphenoxylate-atropine, glycopyrrolate, guaiFENesin, heparin, HYDROmorphone, lidocaine (PF), lidocaine-prilocaine, morphine injection, pentafluoroprop-tetrafluoroeth, polyvinyl alcohol, promethazine, senna-docusate, sodium chloride, sodium chloride, zolpidem  Assessment/ Plan:  69 y.o. female  with a PMHx of COPD, hypertension, ischemic cardiomyopathy with multiple coronary artery stents, history of ventricular tachycardia, peripheral vascular disease, hyperlipidemia, chronic back pain, history of congestive heart failure, who was admitted to [facility] on 06/10/2015 for evaluation of respiratory failure.   1. Acute renal failure secondary to acute tubular necrosis. Patient presented with  multiorgan failure upon presentation. Baseline creatinine 0.75. - Renal function continues to slowly improve.   Will go ahead and d/c temporary HD catheter today so that this won't impede her HD.    2. Hypokalemia.   -- Pt was hyperkalemic, now hypokalemic, has been provided repletion.  3. Acute respiratory failure.  - resolved, was on the vent for an extended period of time.  4. Acute myocardial infarction.  Appears resolved.  5.  Anemia unspecified: hgb 8.2 at the moment, may need to consider epogen as outpt. LOS: [place] 12/3/20161:30 PM

## 2015-07-02 NOTE — Plan of Care (Signed)
Problem: Safety: Goal: Ability to remain free from injury will improve Outcome: Progressing No injuries or safety concerns this shift.  Problem: Pain Managment: Goal: General experience of comfort will improve Outcome: Progressing Pt slept at long intervals, easily arousable, pt reports "my pain is always a nine". Pt observed resting quietly in bed with muscles relaxed. Oxycontin on scheduled basis continued. Pt given morphine, dilaudid, lomotil for abdominal cramping pain related to her colitis. Pain management regimen adequate.  Problem: Skin Integrity: Goal: Risk for impaired skin integrity will decrease Outcome: Progressing Saline soak dsg changed to wound on top of foot with wound clean/granular with mild pinkness periarea. Pink foam continued to bilateral heels. Pericare/barrier performed for perineal/sacral care with skin intact.  Problem: Nutrition: Goal: Adequate nutrition will be maintained Outcome: Progressing Pt drinks ensure enlive readily, enjoys cokes/coffee, eats small amount foods on trays; tolerates well.

## 2015-07-02 NOTE — Plan of Care (Signed)
Problem: Nutrition: Goal: Adequate nutrition will be maintained Outcome: Progressing Plan of care, progress to goals. 1. VSS, pain controlled. 2. Dressing to left ankle completed.  Wound base is clean, small amount of bleeding, edges are attached, no periwound redness or enduration. 3. No falls and pt remained safe and free of injury this shift. 4. Plan for transfer to [facility] today.  Pt tearful about moving.  Allowed pt to express feelings. Dorna Bloom RN

## 2015-07-03 LAB — BASIC METABOLIC PANEL
ANION GAP: 8 (ref 5–15)
BUN: 32 mg/dL — ABNORMAL HIGH (ref 6–20)
CHLORIDE: 99 mmol/L — AB (ref 101–111)
CO2: 35 mmol/L — AB (ref 22–32)
Calcium: 7 mg/dL — ABNORMAL LOW (ref 8.9–10.3)
Creatinine, Ser: 1.35 mg/dL — ABNORMAL HIGH (ref 0.44–1.00)
GFR calc non Af Amer: 39 mL/min — ABNORMAL LOW (ref >60)
GFR, EST AFRICAN AMERICAN: 46 mL/min — AB (ref >60)
Glucose, Bld: 99 mg/dL (ref 65–99)
POTASSIUM: 2.7 mmol/L — AB (ref 3.5–5.1)
Sodium: 142 mmol/L (ref 135–145)

## 2015-07-03 LAB — GLUCOSE, CAPILLARY
GLUCOSE-CAPILLARY: 82 mg/dL (ref 65–99)
GLUCOSE-CAPILLARY: 127 mg/dL — AB (ref 65–99)
GLUCOSE-CAPILLARY: 137 mg/dL — AB (ref 65–99)
GLUCOSE-CAPILLARY: 65 mg/dL (ref 65–99)

## 2015-07-03 MED ORDER — MORPHINE SULFATE (PF) 2 MG/ML IV SOLN
2.0000 mg | INTRAVENOUS | Status: DC | PRN
  Administered 2015-07-03 – 2015-07-05 (×3): 2 mg via INTRAVENOUS
  Filled 2015-07-03 (×3): qty 1

## 2015-07-03 MED ORDER — POTASSIUM CHLORIDE CRYS ER 20 MEQ PO TBCR: 40.0000 meq | EXTENDED_RELEASE_TABLET | ORAL | Status: DC

## 2015-07-03 MED ORDER — POTASSIUM CHLORIDE 10 MEQ/100ML IV SOLN
10.0000 meq | INTRAVENOUS | Status: AC
  Administered 2015-07-03 (×2): 10 meq via INTRAVENOUS
  Filled 2015-07-03 (×2): qty 100

## 2015-07-03 MED ORDER — POTASSIUM CHLORIDE CRYS ER 20 MEQ PO TBCR
40.0000 meq | EXTENDED_RELEASE_TABLET | Freq: Four times a day (QID) | ORAL | Status: DC
  Administered 2015-07-03 (×4): 40 meq via ORAL
  Filled 2015-07-03 (×4): qty 2

## 2015-07-03 NOTE — Progress Notes (Signed)
[facility]  ROUNDING NOTE   Subjective:  Renal function has improved. Creatinine down to 1.3. Dialysis catheter has been removed. Patient in good spirits.   Objective:  Vital signs in last 24 hours:  Temp:  [98.5 F (36.9 C)-98.8 F (37.1 C)] 98.5 F (36.9 C) (12/04 0503) Pulse Rate:  [95-119] 119 (12/04 0503) Resp:  [16-17] 16 (12/04 0503) BP: (134-160)/(61-68) 160/68 mmHg (12/04 0503) SpO2:  [97 %-100 %] 97 % (12/04 0756)  Weight change:  Filed Weights   06/25/15 1505 06/28/15 0945 06/28/15 1330  Weight: 56.7 kg (125 lb) 50.4 kg (111 lb 1.8 oz) 48.9 kg (107 lb 12.9 oz)    Intake/Output: I/O last 3 completed shifts: In: 227 [I.V.:23; IV Piggyback:204] Out: 305 [Urine:302; Stool:3]   Intake/Output this shift:  Total I/O In: 200 [IV Piggyback:200] Out: -   Physical Exam: General: Chronically ill appearing  Head: Healing wound on forehead  ENT: Moist oral mucus membranes  Neck: Supple  Lungs:  Bilateral rhonchi, normal effort  Heart: S1S2 no rubs  Abdomen:  Soft, nontender, BS present   Extremities:  trace peripheral edema.  Neurologic: Awake, alert, following commands  Skin: Large forehead scar from prior skin cancer  Access: Dialysis catheter removed.     Basic Metabolic Panel:  Recent Labs Lab 06/29/15 0429 06/30/15 1345 07/01/15 0557 07/02/15 0450 07/03/15 0646  NA 141 138 142 141 142  K 3.0* 2.3* 3.0* 2.4* 2.7*  CL 98* 93* 99* 97* 99*  CO2 35* 36* 35* 37* 35*  GLUCOSE 96 133* 88 86 99  BUN 27* 28* 32* 32* 32*  CREATININE 1.67* 1.83* 1.70* 1.62* 1.35*  CALCIUM 8.0* 7.2* 7.4* 6.9* 7.0*  MG  --   --  1.3*  --   --     Liver Function Tests: No results for input(s): AST, ALT, ALKPHOS, BILITOT, PROT, ALBUMIN in the last 168 hours. No results for input(s): LIPASE, AMYLASE in the last 168 hours. No results for input(s): AMMONIA in the last 168 hours.  CBC:  Recent Labs Lab 06/27/15 0456 06/30/15 1345 07/02/15 0450  WBC  --   5.1  --   HGB 7.8* 8.7* 8.2*  HCT  --  27.2*  --   MCV  --  77.6*  --   PLT  --  151  --     Cardiac Enzymes: No results for input(s): CKTOTAL, CKMB, CKMBINDEX, TROPONINI in the last 168 hours.  BNP: Invalid input(s): POCBNP  CBG:  Recent Labs Lab 07/02/15 1128 07/02/15 1617 07/02/15 2152 07/03/15 0736 07/03/15 1159  GLUCAP 175* 135* 134* 82 127*    Microbiology: Results for orders placed or performed during the hospital encounter of 06/10/15  Urine culture     Status: None   Collection Time: 06/10/15  7:30 PM  Result Value Ref Range Status   Specimen Description URINE, RANDOM  Final   Special Requests NONE  Final   Culture NO GROWTH 2 DAYS  Final   Report Status 06/12/2015 FINAL  Final  Blood Culture (routine x 2)     Status: None   Collection Time: 06/10/15  8:10 PM  Result Value Ref Range Status   Specimen Description BLOOD RIGHT  Final   Special Requests BOTTLES DRAWN AEROBIC AND ANAEROBIC 5CC  Final   Culture  Setup Time   Final    GRAM POSITIVE COCCI AEROBIC BOTTLE ONLY CRITICAL RESULT CALLED TO, READ BACK BY AND VERIFIED WITH: RN Kathi Simpers 06/14/15 1358  Culture   Final    COAGULASE NEGATIVE STAPHYLOCOCCUS Results consistent with contamination.    Report Status 06/16/2015 FINAL  Final  Blood Culture (routine x 2)     Status: None   Collection Time: 06/10/15  8:20 PM  Result Value Ref Range Status   Specimen Description BLOOD RIGHT  Final   Special Requests BOTTLES DRAWN AEROBIC AND ANAEROBIC 2CC AERO, 4CC  Final   Culture NO GROWTH 5 DAYS  Final   Report Status 06/15/2015 FINAL  Final  MRSA PCR Screening     Status: Abnormal   Collection Time: 06/11/15  1:40 AM  Result Value Ref Range Status   MRSA by PCR POSITIVE (A) NEGATIVE Final    Comment:        The GeneXpert MRSA Assay (FDA approved for NASAL specimens only), is one component of a comprehensive MRSA colonization surveillance program. It is not intended to diagnose  MRSA infection nor to guide or monitor treatment for MRSA infections. CRITICAL RESULT CALLED TO, READ BACK BY AND VERIFIED WITH: [facility] ALLEN AT [id number] 06/11/15.PMH   Culture, sputum-assessment     Status: None   Collection Time: 06/11/15  4:30 AM  Result Value Ref Range Status   Specimen Description TRACHEAL ASPIRATE  Final   Special Requests NONE  Final   Sputum evaluation THIS SPECIMEN IS ACCEPTABLE FOR SPUTUM CULTURE  Final   Report Status 06/11/2015 FINAL  Final  Culture, respiratory (NON-Expectorated)     Status: None   Collection Time: 06/11/15  4:30 AM  Result Value Ref Range Status   Specimen Description TRACHEAL ASPIRATE  Final   Special Requests NONE Reflexed from [id number]  Final   Gram Stain   Final    GOOD SPECIMEN - 80-90% WBCS MODERATE WBC SEEN MODERATE GRAM POSITIVE COCCI RARE GRAM NEGATIVE RODS    Culture HEAVY GROWTH STREPTOCOCCUS PNEUMONIAE  Final   Report Status 06/13/2015 FINAL  Final   Organism ID, Bacteria STREPTOCOCCUS PNEUMONIAE  Final      Susceptibility   Streptococcus pneumoniae - MIC*    ERYTHROMYCIN <=0.12 SENSITIVE Sensitive     VANCOMYCIN 0.5 SENSITIVE Sensitive     TRIMETH/SULFA <=10 SENSITIVE Sensitive     CLINDAMYCIN <=0.25 SENSITIVE Sensitive     LEVOFLOXACIN Value in next row Sensitive      SENSITIVE1    LINEZOLID Value in next row Sensitive      SENSITIVE<=2    * HEAVY GROWTH STREPTOCOCCUS PNEUMONIAE  C difficile quick scan w PCR reflex     Status: None   Collection Time: 06/20/15  4:30 AM  Result Value Ref Range Status   C Diff antigen NEGATIVE NEGATIVE Final   C Diff toxin NEGATIVE NEGATIVE Final   C Diff interpretation Negative for C. difficile  Final  C difficile quick scan w PCR reflex     Status: None   Collection Time: 06/29/15 10:27 AM  Result Value Ref Range Status   C Diff antigen NEGATIVE NEGATIVE Final   C Diff toxin NEGATIVE NEGATIVE Final   C Diff interpretation VALID  Final    Coagulation Studies: No results for  input(s): LABPROT, INR in the last 72 hours.  Urinalysis: No results for input(s): COLORURINE, LABSPEC, PHURINE, GLUCOSEU, HGBUR, BILIRUBINUR, KETONESUR, PROTEINUR, UROBILINOGEN, NITRITE, LEUKOCYTESUR in the last 72 hours.  Invalid input(s): APPERANCEUR    Imaging: No results found.   Medications:     . aspirin  81 mg Oral Daily  . atorvastatin  40 mg Oral q1800  .  budesonide  0.25 mg Nebulization BID  . ciprofloxacin  250 mg Oral BID  . diltiazem  30 mg Oral 4 times per day  . feeding supplement (ENSURE ENLIVE)  237 mL Oral TID  . furosemide  40 mg Oral Daily  . hydrocortisone  10 mg Oral Daily  . hydrocortisone  20 mg Oral Daily  . insulin aspart  0-5 Units Subcutaneous QHS  . insulin aspart  0-9 Units Subcutaneous TID WC  . ipratropium-albuterol  3 mL Nebulization TID  . metoCLOPramide (REGLAN) injection  5 mg Intravenous 3 times per day  . metroNIDAZOLE  250 mg Oral 3 times per day  . oxyCODONE  80 mg Oral Q12H  . pantoprazole  40 mg Oral BID  . potassium chloride  40 mEq Oral QID  . sodium chloride  10-40 mL Intracatheter Q12H  . sodium chloride  3 mL Intravenous Q12H   acetaminophen **OR** [DISCONTINUED] acetaminophen, alteplase, diazepam, diphenoxylate-atropine, glycopyrrolate, guaiFENesin, heparin, HYDROmorphone, lidocaine (PF), lidocaine-prilocaine, pentafluoroprop-tetrafluoroeth, polyvinyl alcohol, promethazine, senna-docusate, sodium chloride, sodium chloride, zolpidem  Assessment/ Plan:  69 y.o. female  with a PMHx of COPD, hypertension, ischemic cardiomyopathy with multiple coronary artery stents, history of ventricular tachycardia, peripheral vascular disease, hyperlipidemia, chronic back pain, history of congestive heart failure, who was admitted to [facility] on 06/10/2015 for evaluation of respiratory failure.   1. Acute renal failure secondary to acute tubular necrosis. Patient presented with multiorgan failure upon presentation. Baseline creatinine 0.75. -  renal function continues to improve.  Creatinine down to 1.3.  Temporary dialysis catheter has been discontinued. No further need for dialysis at this time.    2. Hypokalemia.   --potassium still quite low at 2.7.  Patient being given a total of 100 mEq of potassium chloride today.  3. Acute respiratory failure.  - patient continues to do well off the ventilator.  4. Acute myocardial infarction.  Appears resolved.  5.  Anemia unspecified: hemoglobin currently 8.2.  Continue to monitor as an outpatient.  6.  We plan to see the patient back in the office in 2-3 weeks after discharge. LOS: Orion, Jacey Pelc 12/4/20161:39 PM

## 2015-07-03 NOTE — Plan of Care (Signed)
Problem: Safety: Goal: Ability to remain free from injury will improve Outcome: Progressing No safety issues/injuries this shift. No attempts to get OOB.  Problem: Pain Managment: Goal: General experience of comfort will improve Outcome: Progressing Lomotil given x 2 to decrease diarrhea with 1 loose stool today. Eating well for her; drinking supplements. Pt talks when ask about nausea when eating/drinking, but eats well/tolerated diet; no emesis or dry heaves. Comfort measures continued. Morphine discontinued. Pt given tylenol x 1 for pain all over with pt resting quietly with eyes closed. Continues scheduled oxycodone.   Problem: Skin Integrity: Goal: Risk for impaired skin integrity will decrease Outcome: Progressing  Skin tear right forearm healed with dsg discontinued.  Preventive pink foam applied to sacrum- skin intact. Pink foam continued on heels. Pt refused any other position except supine; when repositioned, pt moves herself back.  Problem: Nutrition: Goal: Adequate nutrition will be maintained Outcome: Progressing Ate well today; drank her supplements readily. No emesis. Talks about nausea with reglan IV given as scheduled. K+ remains low at 2.7 with supplements of po K+ and IV potassium given.

## 2015-07-03 NOTE — Progress Notes (Signed)
TC with critical lab of K+ 2.7 with Dr. Serita Grit. States he will place orders.

## 2015-07-03 NOTE — Progress Notes (Signed)
Patient ID: Lynn Butler, female   DOB: [birth year], 69 y.o.   MRN: [id number] [facility] PROGRESS NOTE  PCP: Juanell Fairly, MD  HPI/Subjective: Feeling little better states that had episode of breathing trouble this morning now resolved diarrhea unless   Objective: Filed Vitals:   07/02/15 2100 07/03/15 0503  BP: 134/61 160/68  Pulse: 95 119  Temp: 98.8 F (37.1 C) 98.5 F (36.9 C)  Resp: 17 16    Filed Weights   06/25/15 1505 06/28/15 0945 06/28/15 1330  Weight: 56.7 kg (125 lb) 50.4 kg (111 lb 1.8 oz) 48.9 kg (107 lb 12.9 oz)    ROS: Review of Systems  Constitutional: Negative for fever and chills.  Eyes: Negative for blurred vision.  Respiratory: Negative for cough and shortness of breath Cardiovascular: Negative for chest pain.  Gastrointestinal: Positive for nausea, abdominal pain and diarrhea improved. Negative for vomiting and constipation.  Genitourinary: Negative for dysuria.  Musculoskeletal: Positive for back pain. Negative for joint pain.  Neurological: Positive for tremors. Negative for dizziness and headaches.   Exam: Physical Exam  Constitutional: She is oriented to person, place, and time.  HENT:  Nose: No mucosal edema.  Mouth/Throat: No oropharyngeal exudate or posterior oropharyngeal edema.  Eyes: Conjunctivae, EOM and lids are normal. Pupils are equal, round, and reactive to light.  Neck: No JVD present. Carotid bruit is not present. No edema present. No thyroid mass and no thyromegaly present.  Cardiovascular: S1 normal and S2 normal.  Exam reveals no gallop.   No murmur heard. Pulses:      Dorsalis pedis pulses are 0 on the right side, and 0 on the left side.  Respiratory: No accessory muscle usage. No respiratory distress. Diminished breath sounds without any rales rhonchi or wheezing GI: Soft. Bowel sounds are normal. There is generalized tenderness.  Musculoskeletal:       Right ankle: She exhibits no swelling.       Left  ankle: She exhibits no swelling.  Lymphadenopathy:    She has no cervical adenopathy.  Neurological: She is alert and oriented to person, place, and time. No cranial nerve deficit.  Skin: Skin is warm. Nails show no clubbing.  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 Skin graft on  forehead with scab. Sacral decubiti stage II.  Psychiatric: She has a normal mood and affect.    Data Reviewed: Basic Metabolic Panel:  Recent Labs Lab 06/29/15 0429 06/30/15 1345 07/01/15 0557 07/02/15 0450 07/03/15 0646  NA 141 138 142 141 142  K 3.0* 2.3* 3.0* 2.4* 2.7*  CL 98* 93* 99* 97* 99*  CO2 35* 36* 35* 37* 35*  GLUCOSE 96 133* 88 86 99  BUN 27* 28* 32* 32* 32*  CREATININE 1.67* 1.83* 1.70* 1.62* 1.35*  CALCIUM 8.0* 7.2* 7.4* 6.9* 7.0*  MG  --   --  1.3*  --   --    I just CBC:  Recent Labs Lab 06/27/15 0456 06/30/15 1345 07/02/15 0450  WBC  --  5.1  --   HGB 7.8* 8.7* 8.2*  HCT  --  27.2*  --   MCV  --  77.6*  --   PLT  --  151  --      Recent Results (from the past 240 hour(s))  C difficile quick scan w PCR reflex     Status: None   Collection Time: 06/29/15 10:27 AM  Result Value Ref Range Status   C Diff antigen NEGATIVE NEGATIVE Final  C Diff toxin NEGATIVE NEGATIVE Final   C Diff interpretation VALID  Final    Scheduled Meds: . aspirin  81 mg Oral Daily  . atorvastatin  40 mg Oral q1800  . budesonide  0.25 mg Nebulization BID  . ciprofloxacin  250 mg Oral BID  . diltiazem  30 mg Oral 4 times per day  . feeding supplement (ENSURE ENLIVE)  237 mL Oral TID  . furosemide  40 mg Oral Daily  . hydrocortisone  10 mg Oral Daily  . hydrocortisone  20 mg Oral Daily  . insulin aspart  0-5 Units Subcutaneous QHS  . insulin aspart  0-9 Units Subcutaneous TID WC  . ipratropium-albuterol  3 mL Nebulization TID  . metoCLOPramide (REGLAN) injection  5 mg Intravenous 3 times per day  . metroNIDAZOLE  250 mg Oral 3 times per day  . oxyCODONE  80 mg Oral Q12H  .  pantoprazole  40 mg Oral BID  . potassium chloride  40 mEq Oral QID  . sodium chloride  10-40 mL Intracatheter Q12H  . sodium chloride  3 mL Intravenous Q12H    Assessment/Plan:  1. Paroxysmal atrial fibrillation. Continue oral Cardizem. Not a great candidate for continuous anticoagulation.  2. Abdominal pain - improved 3. Acute systolic congestive heart failure.  No beta blocker with bronchospasm. No ACE inhibitor with borderline renal function. Continue Lasix orally 4. Septic shock secondary to Streptococcus pneumoniae. Patient finished treatment with IV Rocephin. 5. Hypoglycemia- appreciate endocrine consult. Patient is not a diabetic. Blood sugar better. For possible adrenal insufficiency on hydrocortisone 20 mg in the morning, 10 mg in the p.m. 6. Acute respiratory failure, COPD exacerbation and pneumonia and now heart failure. Patient does not want further intubation or BiPAP. Patient extubated 11/21. Continue when necessary oxygen 7. Acute renal failure. Creatinine slowly improving. Remove dialysis catheter per nephrology  8. Acute myocardial infarction NSTEMI- aspirin, statin and medical management. No beta blocker with bronchospasm and end-stage COPD 9. Acute on chronic anemia- hemoglobin stable 10. Chronic pain syndrome on oxycodone and Dilaudid and Valium which she was taking at home discontinue IV pain medications 11. Weakness- Will likely need long-term care 12. Diarrhea- repeat stool for C. difficile is negative. On when necessary Imodium now diarrhea resolved 13. Nausea- Phenergan as needed for nausea. 14. Severe hypokalemia continue to replace Code Status:     Code Status Orders        Start     Ordered   06/19/15 1140  Do not attempt resuscitation (DNR)   Continuous    Question Answer Comment  In the event of cardiac or respiratory ARREST Do not call a "code blue"   In the event of cardiac or respiratory ARREST Do not perform Intubation, CPR, defibrillation or ACLS    In the event of cardiac or respiratory ARREST Use medication by any route, position, wound care, and other measures to relive pain and suffering. May use oxygen, suction and manual treatment of airway obstruction as needed for comfort.      06/19/15 1140      Disposition Plan: Will need long-term care   Consultants:  Critical care specialist  Nephrology  Palliative care team  Time spent: 20 minutes.  Posey Pronto [facility]

## 2015-07-03 NOTE — Plan of Care (Signed)
Problem: Safety: Goal: Ability to remain free from injury will improve Outcome: Progressing Patient in bed this shift.  Bed in lowest position and call bell and phone within reach.  Patient compliant in calling for assistance.  Patient free from injury this shift.   Problem: Pain Managment: Goal: General experience of comfort will improve Outcome: Progressing Pt was able to sleep for long interval this shift.  She continues to report pain at 10/10 when asked even when observed resting in bed and relaxed.  Patient given Oxycontin and Dilaudid this shift as well as Ambien per request at bedtime.     Problem: Skin Integrity: Goal: Risk for impaired skin integrity will decrease Outcome: Progressing Wet to dry dressing change to left foot this shift.  Pink foam on bilateral heels.  Foam on sacrum.  Patient repositioned every two hours throughout night to prevent further injury.

## 2015-07-04 LAB — BASIC METABOLIC PANEL
ANION GAP: 5 (ref 5–15)
BUN: 36 mg/dL — ABNORMAL HIGH (ref 6–20)
CO2: 37 mmol/L — ABNORMAL HIGH (ref 22–32)
Calcium: 7.7 mg/dL — ABNORMAL LOW (ref 8.9–10.3)
Chloride: 103 mmol/L (ref 101–111)
Creatinine, Ser: 1.38 mg/dL — ABNORMAL HIGH (ref 0.44–1.00)
GFR calc Af Amer: 44 mL/min — ABNORMAL LOW (ref >60)
GFR, EST NON AFRICAN AMERICAN: 38 mL/min — AB (ref >60)
GLUCOSE: 85 mg/dL (ref 65–99)
POTASSIUM: 5.2 mmol/L — AB (ref 3.5–5.1)
Sodium: 145 mmol/L (ref 135–145)

## 2015-07-04 LAB — GLUCOSE, CAPILLARY
GLUCOSE-CAPILLARY: 28 mg/dL — AB (ref 65–99)
Glucose-Capillary: 39 mg/dL — CL (ref 65–99)
Glucose-Capillary: 110 mg/dL — ABNORMAL HIGH (ref 65–99)
Glucose-Capillary: 95 mg/dL (ref 65–99)

## 2015-07-04 MED ORDER — DEXTROSE 5 % IV SOLN
INTRAVENOUS | Status: AC
  Administered 2015-07-04: 11:00:00 via INTRAVENOUS

## 2015-07-04 MED ORDER — DEXTROSE 50 % IV SOLN
INTRAVENOUS | Status: AC
  Administered 2015-07-04: 08:00:00
  Filled 2015-07-04: qty 50

## 2015-07-04 MED ORDER — DEXTROSE 50 % IV SOLN
25.0000 mL | Freq: Once | INTRAVENOUS | Status: AC
  Administered 2015-07-04: 25 mL via INTRAVENOUS

## 2015-07-04 NOTE — Plan of Care (Signed)
Problem: Nutrition: Goal: Adequate nutrition will be maintained Outcome: Progressing Pain and anxiety medications given with noted relief. VSS. Dressing to left foot changed. IV fluids infusing.

## 2015-07-04 NOTE — Progress Notes (Signed)
[facility]  ROUNDING NOTE   Subjective:   Patient sitting up in bed. Eating sherbet.  Potassium 5.2 from overcorrection  Objective:  Vital signs in last 24 hours:  Temp:  [97.3 F (36.3 C)-100.3 F (37.9 C)] 100.3 F (37.9 C) (12/05 1208) Pulse Rate:  [54-110] 110 (12/05 1208) Resp:  [18] 18 (12/04 1500) BP: (93-133)/(52-59) 106/57 mmHg (12/05 1208) SpO2:  [98 %-100 %] 100 % (12/05 1208)  Weight change:  Filed Weights   06/25/15 1505 06/28/15 0945 06/28/15 1330  Weight: 56.7 kg (125 lb) 50.4 kg (111 lb 1.8 oz) 48.9 kg (107 lb 12.9 oz)    Intake/Output: I/O last 3 completed shifts: In: 200 [IV Piggyback:200] Out: 7 [Urine:3; Stool:4]   Intake/Output this shift:     Physical Exam: General: Chronically ill appearing  Head: Healing wound on forehead  ENT: Moist oral mucus membranes  Neck: Supple  Lungs:  Bilateral rhonchi, normal effort  Heart: S1S2 no rubs  Abdomen:  Soft, nontender, BS present   Extremities:  trace peripheral edema.  Neurologic: Awake, alert, following commands  Skin: Large forehead scar from prior skin cancer       Basic Metabolic Panel:  Recent Labs Lab 06/30/15 1345 07/01/15 0557 07/02/15 0450 07/03/15 0646 07/04/15 0603  NA 138 142 141 142 145  K 2.3* 3.0* 2.4* 2.7* 5.2*  CL 93* 99* 97* 99* 103  CO2 36* 35* 37* 35* 37*  GLUCOSE 133* 88 86 99 85  BUN 28* 32* 32* 32* 36*  CREATININE 1.83* 1.70* 1.62* 1.35* 1.38*  CALCIUM 7.2* 7.4* 6.9* 7.0* 7.7*  MG  --  1.3*  --   --   --     Liver Function Tests: No results for input(s): AST, ALT, ALKPHOS, BILITOT, PROT, ALBUMIN in the last 168 hours. No results for input(s): LIPASE, AMYLASE in the last 168 hours. No results for input(s): AMMONIA in the last 168 hours.  CBC:  Recent Labs Lab 06/30/15 1345 07/02/15 0450  WBC 5.1  --   HGB 8.7* 8.2*  HCT 27.2*  --   MCV 77.6*  --   PLT 151  --     Cardiac Enzymes: No results for input(s): CKTOTAL, CKMB, CKMBINDEX,  TROPONINI in the last 168 hours.  BNP: Invalid input(s): POCBNP  CBG:  Recent Labs Lab 07/03/15 2205 07/04/15 0722 07/04/15 0752 07/04/15 0841 07/04/15 1114  GLUCAP 65 39* 28* 110* 95    Microbiology: Results for orders placed or performed during the hospital encounter of 06/10/15  Urine culture     Status: None   Collection Time: 06/10/15  7:30 PM  Result Value Ref Range Status   Specimen Description URINE, RANDOM  Final   Special Requests NONE  Final   Culture NO GROWTH 2 DAYS  Final   Report Status 06/12/2015 FINAL  Final  Blood Culture (routine x 2)     Status: None   Collection Time: 06/10/15  8:10 PM  Result Value Ref Range Status   Specimen Description BLOOD RIGHT  Final   Special Requests BOTTLES DRAWN AEROBIC AND ANAEROBIC 5CC  Final   Culture  Setup Time   Final    GRAM POSITIVE COCCI AEROBIC BOTTLE ONLY CRITICAL RESULT CALLED TO, READ BACK BY AND VERIFIED WITH: RN Kathi Simpers 06/14/15 1358    Culture   Final    COAGULASE NEGATIVE STAPHYLOCOCCUS Results consistent with contamination.    Report Status 06/16/2015 FINAL  Final  Blood Culture (routine x 2)  Status: None   Collection Time: 06/10/15  8:20 PM  Result Value Ref Range Status   Specimen Description BLOOD RIGHT  Final   Special Requests BOTTLES DRAWN AEROBIC AND ANAEROBIC 2CC AERO, 4CC  Final   Culture NO GROWTH 5 DAYS  Final   Report Status 06/15/2015 FINAL  Final  MRSA PCR Screening     Status: Abnormal   Collection Time: 06/11/15  1:40 AM  Result Value Ref Range Status   MRSA by PCR POSITIVE (A) NEGATIVE Final    Comment:        The GeneXpert MRSA Assay (FDA approved for NASAL specimens only), is one component of a comprehensive MRSA colonization surveillance program. It is not intended to diagnose MRSA infection nor to guide or monitor treatment for MRSA infections. CRITICAL RESULT CALLED TO, READ BACK BY AND VERIFIED WITH: [facility] ALLEN AT [id number] 06/11/15.PMH   Culture,  sputum-assessment     Status: None   Collection Time: 06/11/15  4:30 AM  Result Value Ref Range Status   Specimen Description TRACHEAL ASPIRATE  Final   Special Requests NONE  Final   Sputum evaluation THIS SPECIMEN IS ACCEPTABLE FOR SPUTUM CULTURE  Final   Report Status 06/11/2015 FINAL  Final  Culture, respiratory (NON-Expectorated)     Status: None   Collection Time: 06/11/15  4:30 AM  Result Value Ref Range Status   Specimen Description TRACHEAL ASPIRATE  Final   Special Requests NONE Reflexed from [id number]  Final   Gram Stain   Final    GOOD SPECIMEN - 80-90% WBCS MODERATE WBC SEEN MODERATE GRAM POSITIVE COCCI RARE GRAM NEGATIVE RODS    Culture HEAVY GROWTH STREPTOCOCCUS PNEUMONIAE  Final   Report Status 06/13/2015 FINAL  Final   Organism ID, Bacteria STREPTOCOCCUS PNEUMONIAE  Final      Susceptibility   Streptococcus pneumoniae - MIC*    ERYTHROMYCIN <=0.12 SENSITIVE Sensitive     VANCOMYCIN 0.5 SENSITIVE Sensitive     TRIMETH/SULFA <=10 SENSITIVE Sensitive     CLINDAMYCIN <=0.25 SENSITIVE Sensitive     LEVOFLOXACIN Value in next row Sensitive      SENSITIVE1    LINEZOLID Value in next row Sensitive      SENSITIVE<=2    * HEAVY GROWTH STREPTOCOCCUS PNEUMONIAE  C difficile quick scan w PCR reflex     Status: None   Collection Time: 06/20/15  4:30 AM  Result Value Ref Range Status   C Diff antigen NEGATIVE NEGATIVE Final   C Diff toxin NEGATIVE NEGATIVE Final   C Diff interpretation Negative for C. difficile  Final  C difficile quick scan w PCR reflex     Status: None   Collection Time: 06/29/15 10:27 AM  Result Value Ref Range Status   C Diff antigen NEGATIVE NEGATIVE Final   C Diff toxin NEGATIVE NEGATIVE Final   C Diff interpretation VALID  Final    Coagulation Studies: No results for input(s): LABPROT, INR in the last 72 hours.  Urinalysis: No results for input(s): COLORURINE, LABSPEC, PHURINE, GLUCOSEU, HGBUR, BILIRUBINUR, KETONESUR, PROTEINUR, UROBILINOGEN,  NITRITE, LEUKOCYTESUR in the last 72 hours.  Invalid input(s): APPERANCEUR    Imaging: No results found.   Medications:   . dextrose 50 mL/hr at 07/04/15 1055   . aspirin  81 mg Oral Daily  . atorvastatin  40 mg Oral q1800  . budesonide  0.25 mg Nebulization BID  . ciprofloxacin  250 mg Oral BID  . diltiazem  30 mg Oral 4 times per  day  . feeding supplement (ENSURE ENLIVE)  237 mL Oral TID  . furosemide  40 mg Oral Daily  . hydrocortisone  10 mg Oral Daily  . hydrocortisone  20 mg Oral Daily  . insulin aspart  0-9 Units Subcutaneous TID WC  . ipratropium-albuterol  3 mL Nebulization TID  . metoCLOPramide (REGLAN) injection  5 mg Intravenous 3 times per day  . metroNIDAZOLE  250 mg Oral 3 times per day  . oxyCODONE  80 mg Oral Q12H  . pantoprazole  40 mg Oral BID  . sodium chloride  10-40 mL Intracatheter Q12H  . sodium chloride  3 mL Intravenous Q12H   acetaminophen **OR** [DISCONTINUED] acetaminophen, alteplase, diazepam, diphenoxylate-atropine, glycopyrrolate, guaiFENesin, heparin, HYDROmorphone, lidocaine (PF), lidocaine-prilocaine, morphine injection, pentafluoroprop-tetrafluoroeth, polyvinyl alcohol, promethazine, senna-docusate, sodium chloride, sodium chloride, zolpidem  Assessment/ Plan:  69 y.o. female  with a PMHx of COPD, hypertension, ischemic cardiomyopathy with multiple coronary artery stents, history of ventricular tachycardia, peripheral vascular disease, hyperlipidemia, chronic back pain, history of congestive heart failure, who was admitted to [facility] on 06/10/2015 for evaluation of respiratory failure.   1. Acute renal failure secondary to acute tubular necrosis. Patient presented with multiorgan failure upon presentation. Baseline creatinine 0.75. - renal function continues to improve.  Creatinine down to 1.3.  Temporary dialysis catheter has been discontinued. No further need for dialysis at this time.    2. Hypokalemia. Now over corrected and with  hyperkalemia.   3. Acute respiratory failure.  - patient continues to do well off the ventilator.  4. Acute myocardial infarction.  Appears resolved.  5.  Anemia unspecified: hemoglobin currently 8.2.  Continue to monitor as an outpatient.  LOS: [place], [place] 12/5/20162:09 PM

## 2015-07-04 NOTE — Plan of Care (Signed)
Problem: Safety: Goal: Ability to remain free from injury will improve Outcome: Progressing Patient in bed this shift. Bed in lowest position and call bell and phone within reach. Patient compliant in calling for assistance. Patient free from injury this shift.   Problem: Pain Managment: Goal: General experience of comfort will improve Outcome: Progressing Pt was able to sleep for long interval this shift. She continues to report pain at 10/10 when asked even when observed resting in bed and relaxed. Patient given Oxycontin this shift as well as Ambien per request at bedtime.Patient with acute respiratory distress this evening.  Respiratory gave breathing treatment and Dr. Lavetta Nielsen ordered 2mg  of Morphine.  Patient able to breath and rest comfortably after administration.   Problem: Skin Integrity: Goal: Risk for impaired skin integrity will decrease Outcome: Progressing Wet to dry dressing change to left foot this shift. Pink foam on bilateral heels. Foam on sacrum. Patient repositioned every two hours throughout night to prevent further injury.

## 2015-07-04 NOTE — Progress Notes (Signed)
Endocrinology Consult follow up:  S: Lynn Butler is a 69 y.o. female with PMH COPD, CAD, CHF, PAD, Arrhythmia, HCC, and other comorbidities who was admitted with hypoxic respiratory failure, pneumonia, and resulting septic shock. She has had a prolonged and complicated hospital course including CHF, renal failure on hemodialysis, NSTEMI. Endocrinology was consulted for hypoglycemia. She is being treated empirically for secondary adrenal insufficiency with hydrocortisone 10/5 mg at 8 am and 4 pm daily.   Called to reassess patient due to fingerstick blood glucose of 28 this am. Serum glucose level was 85 an hour prior to that. The patient does not receive any glucose-lowering medication. The patient and RN both report she was without symptoms at the time. A serum glucose was not checked to confirm the fingerstick measurement of 28. The patient received IV dextrose for treatment. Pt reports she is in pain today and has ongoing nausea but feels ok in general. Her appetite is still poor but she is trying to eat.   ODanley Danker Vitals:   07/04/15 0434 07/04/15 1208  BP: 111/52 106/57  Pulse: 93 110  Temp: 98.3 F (36.8 C) 100.3 F (37.9 C)  Resp:     Physical Exam: Gen: no acute distress, sitting in bed, cachectic, appearing older than stated age 54: [place]/AT, eyes anicteric, EOMI, mucous membranes moist Neck: supple PULM: breathing unlabored on nasal canula O2 GI: abdomen distended, soft, non-tender EXT: no edema, bilateral heel pressure ulcers with dressings in place  Neuro: alert and oriented x 3  Labs:  BMP Latest Ref Rng 07/04/2015 07/03/2015 07/02/2015  Glucose 65 - 99 mg/dL 85 99 86  BUN 6 - 20 mg/dL 36(H) 32(H) 32(H)  Creatinine 0.44 - 1.00 mg/dL 1.38(H) 1.35(H) 1.62(H)  Sodium 135 - 145 mmol/L 145 142 141  Potassium 3.5 - 5.1 mmol/L 5.2(H) 2.7(LL) 2.4(LL)  Chloride 101 - 111 mmol/L 103 99(L) 97(L)  CO2 22 - 32 mmol/L 37(H) 35(H) 37(H)  Calcium 8.9 - 10.3 mg/dL 7.7(L) 7.0(L)  6.9(L)   Component     Latest Ref Rng 06/29/2015  Hemoglobin A1C     4.0 - 6.0 % 5.7   ASSESSMENT:  1. Hypoglycemia - there is a discrepancy between fingerstick and serum glucose. Suspect there is no true hypoglycemia as patient is completely asymptomatic.  2. Pre-diabetes  3. 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 Discontinue sliding scale insulin. She has not required any. Patient was counseled at length today about signs and symptoms of hypoglycemia. She was instructed to alert her nurse immediately if they occur. If patient reports symptoms of hypoglycemia, please check fingerstick BG. If BG<70, please confirm by sending a serum glucose sample before treating per hypoglycemia protocol. Continue hydrocortisone: 20 mg at 8 am; 10 mg at 4 pm. She should follow up with me on 07/14/2015 at 1 pm. Please call to reschedule if patient is still hospitalized at that time. Please call if I can be of further assistance.  Atha Starks, MD [facility] Endocrinology

## 2015-07-04 NOTE — Care Management Important Message (Signed)
Important Message  Patient Details  Name: Lynn Butler MRN: [id number] Date of Birth: 04-08-1946   Medicare Important Message Given:  Yes    Shelbie Ammons, RN 07/04/2015, 12:42 PM

## 2015-07-04 NOTE — Progress Notes (Signed)
Patient ID: Lynn Butler, female   DOB: [birth year], 69 y.o.   MRN: [id number] [facility] PROGRESS NOTE  PCP: Juanell Fairly, MD  HPI/Subjective: Hypoglycemia since last night otherwise feels okay   Objective: Filed Vitals:   07/04/15 0434 07/04/15 1208  BP: 111/52 106/57  Pulse: 93 110  Temp: 98.3 F (36.8 C) 100.3 F (37.9 C)  Resp:      Filed Weights   06/25/15 1505 06/28/15 0945 06/28/15 1330  Weight: 56.7 kg (125 lb) 50.4 kg (111 lb 1.8 oz) 48.9 kg (107 lb 12.9 oz)    ROS: Review of Systems  Constitutional: Negative for fever and chills.  Eyes: Negative for blurred vision.  Respiratory: Negative for cough and shortness of breath Cardiovascular: Negative for chest pain.  Gastrointestinal: Improved nausea, abdominal pain and diarrhea improved. Negative for vomiting and constipation.  Genitourinary: Negative for dysuria.  Musculoskeletal: Positive for back pain. Negative for joint pain.  Neurological: Positive for tremors. Negative for dizziness and headaches.   Exam: Physical Exam  Constitutional: She is oriented to person, place, and time.  HENT:  Nose: No mucosal edema.  Mouth/Throat: No oropharyngeal exudate or posterior oropharyngeal edema.  Eyes: Conjunctivae, EOM and lids are normal. Pupils are equal, round, and reactive to light.  Neck: No JVD present. Carotid bruit is not present. No edema present. No thyroid mass and no thyromegaly present.  Cardiovascular: S1 normal and S2 normal.  Exam reveals no gallop.   No murmur heard. Pulses:      Dorsalis pedis pulses are 0 on the right side, and 0 on the left side.  Respiratory: No accessory muscle usage. No respiratory distress. Diminished breath sounds without any rales rhonchi or wheezing GI: Soft. Bowel sounds are normal. There is generalized tenderness.  Musculoskeletal:       Right ankle: She exhibits no swelling.       Left ankle: She exhibits no swelling.  Lymphadenopathy:    She has no  cervical adenopathy.  Neurological: She is alert and oriented to person, place, and time. No cranial nerve deficit.  Skin: Skin is warm. Nails show no clubbing.  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 Skin graft on  forehead with scab. Sacral decubiti stage II.  Psychiatric: She has a normal mood and affect.    Data Reviewed: Basic Metabolic Panel:  Recent Labs Lab 06/30/15 1345 07/01/15 0557 07/02/15 0450 07/03/15 0646 07/04/15 0603  NA 138 142 141 142 145  K 2.3* 3.0* 2.4* 2.7* 5.2*  CL 93* 99* 97* 99* 103  CO2 36* 35* 37* 35* 37*  GLUCOSE 133* 88 86 99 85  BUN 28* 32* 32* 32* 36*  CREATININE 1.83* 1.70* 1.62* 1.35* 1.38*  CALCIUM 7.2* 7.4* 6.9* 7.0* 7.7*  MG  --  1.3*  --   --   --    I just CBC:  Recent Labs Lab 06/30/15 1345 07/02/15 0450  WBC 5.1  --   HGB 8.7* 8.2*  HCT 27.2*  --   MCV 77.6*  --   PLT 151  --      Recent Results (from the past 240 hour(s))  C difficile quick scan w PCR reflex     Status: None   Collection Time: 06/29/15 10:27 AM  Result Value Ref Range Status   C Diff antigen NEGATIVE NEGATIVE Final   C Diff toxin NEGATIVE NEGATIVE Final   C Diff interpretation VALID  Final    Scheduled Meds: . aspirin  81  mg Oral Daily  . atorvastatin  40 mg Oral q1800  . budesonide  0.25 mg Nebulization BID  . ciprofloxacin  250 mg Oral BID  . diltiazem  30 mg Oral 4 times per day  . feeding supplement (ENSURE ENLIVE)  237 mL Oral TID  . furosemide  40 mg Oral Daily  . hydrocortisone  10 mg Oral Daily  . hydrocortisone  20 mg Oral Daily  . insulin aspart  0-9 Units Subcutaneous TID WC  . ipratropium-albuterol  3 mL Nebulization TID  . metoCLOPramide (REGLAN) injection  5 mg Intravenous 3 times per day  . metroNIDAZOLE  250 mg Oral 3 times per day  . oxyCODONE  80 mg Oral Q12H  . pantoprazole  40 mg Oral BID  . sodium chloride  10-40 mL Intracatheter Q12H  . sodium chloride  3 mL Intravenous Q12H     Assessment/Plan:  1. Paroxysmal atrial fibrillation. Continue oral Cardizem. Not a great candidate for continuous anticoagulation.  2. Abdominal pain - improved 3. Acute systolic congestive heart failure.  No beta blocker with bronchospasm. No ACE inhibitor with borderline renal function. Continue Lasix orally 4. Septic shock secondary to Streptococcus pneumoniae. Patient finished treatment with IV Rocephin. 5. Hypoglycemia- appreciate endocrine consult.  Hypoglycemia persists i have requested endocrine to see patient again 6. Acute respiratory failure, COPD exacerbation and pneumonia and now heart failure. Patient does not want further intubation or BiPAP. Patient extubated 11/21. Continue when necessary oxygen 7. Acute renal failure. Resolved 8. Acute myocardial infarction NSTEMI- aspirin, statin and medical management. No beta blocker with bronchospasm and end-stage COPD 9. Acute on chronic anemia- hemoglobin stable 10. Chronic pain syndrome on oxycodone and Dilaudid and Valium which she was taking at home discontinue IV pain medications 11. Weakness- Will likely need long-term care 12. Diarrhea- repeat stool for C. difficile is negative. Continue when necessary Imodium 13. Nausea- Phenergan as needed for nausea. 14. Severe hypokalemia status post replacement now high DC potassium Code Status:     Code Status Orders        Start     Ordered   06/19/15 1140  Do not attempt resuscitation (DNR)   Continuous    Question Answer Comment  In the event of cardiac or respiratory ARREST Do not call a "code blue"   In the event of cardiac or respiratory ARREST Do not perform Intubation, CPR, defibrillation or ACLS   In the event of cardiac or respiratory ARREST Use medication by any route, position, wound care, and other measures to relive pain and suffering. May use oxygen, suction and manual treatment of airway obstruction as needed for comfort.      06/19/15 1140      Disposition  Plan: Will need long-term care   Consultants:  Critical care specialist  Nephrology  Palliative care team  Time spent: 20 minutes.  Posey Pronto [facility]

## 2015-07-05 LAB — BASIC METABOLIC PANEL
Anion gap: 7 (ref 5–15)
BUN: 30 mg/dL — AB (ref 6–20)
CALCIUM: 6.9 mg/dL — AB (ref 8.9–10.3)
CO2: 33 mmol/L — AB (ref 22–32)
CREATININE: 1.27 mg/dL — AB (ref 0.44–1.00)
Chloride: 99 mmol/L — ABNORMAL LOW (ref 101–111)
GFR calc Af Amer: 49 mL/min — ABNORMAL LOW (ref >60)
GFR calc non Af Amer: 42 mL/min — ABNORMAL LOW (ref >60)
GLUCOSE: 77 mg/dL (ref 65–99)
Potassium: 4 mmol/L (ref 3.5–5.1)
Sodium: 139 mmol/L (ref 135–145)

## 2015-07-05 MED ORDER — FUROSEMIDE 40 MG PO TABS: 40.0000 mg | ORAL_TABLET | Freq: Every day | ORAL | Status: AC

## 2015-07-05 MED ORDER — HYDROMORPHONE HCL 4 MG PO TABS: 4.0000 mg | ORAL_TABLET | Freq: Four times a day (QID) | ORAL | Status: AC | PRN

## 2015-07-05 MED ORDER — ACETAMINOPHEN 325 MG PO TABS: 650.0000 mg | ORAL_TABLET | Freq: Four times a day (QID) | ORAL | Status: AC | PRN

## 2015-07-05 MED ORDER — PANTOPRAZOLE SODIUM 40 MG PO TBEC: 40.0000 mg | DELAYED_RELEASE_TABLET | Freq: Two times a day (BID) | ORAL | Status: AC

## 2015-07-05 MED ORDER — DIAZEPAM 10 MG PO TABS: 10.0000 mg | ORAL_TABLET | Freq: Two times a day (BID) | ORAL | Status: AC | PRN

## 2015-07-05 MED ORDER — BUDESONIDE 0.25 MG/2ML IN SUSP: 0.2500 mg | Freq: Two times a day (BID) | RESPIRATORY_TRACT | Status: AC

## 2015-07-05 MED ORDER — OXYCODONE HCL ER 80 MG PO T12A: 80.0000 mg | EXTENDED_RELEASE_TABLET | Freq: Two times a day (BID) | ORAL | Status: AC

## 2015-07-05 MED ORDER — DILTIAZEM HCL 30 MG PO TABS: 30.0000 mg | ORAL_TABLET | Freq: Four times a day (QID) | ORAL | Status: AC

## 2015-07-05 MED ORDER — HYDROCORTISONE 20 MG PO TABS: 20.0000 mg | ORAL_TABLET | Freq: Every day | ORAL | Status: AC

## 2015-07-05 MED ORDER — DIPHENOXYLATE-ATROPINE 2.5-0.025 MG PO TABS: 2.0000 | ORAL_TABLET | Freq: Four times a day (QID) | ORAL | Status: AC | PRN

## 2015-07-05 MED ORDER — ASPIRIN 81 MG PO CHEW: 81.0000 mg | CHEWABLE_TABLET | Freq: Every day | ORAL | Status: AC

## 2015-07-05 NOTE — Discharge Summary (Signed)
Lynn Butler, 69 y.o., DOB 01-17-1946, MRN [id number]. Admission date: 06/10/2015 Discharge Date 07/05/2015 Primary MD Juanell Fairly, MD Admitting Physician Lance Coon, MD  Admission Diagnosis  Multisystem organ failure [R69] HCAP (healthcare-associated pneumonia) [J18.9] Acute respiratory failure with hypoxia and hypercarbia ([place]) [J96.01, J96.02] Sepsis, due to unspecified organism [facility]) [A41.9]  Discharge Diagnosis   Principal Problem:   Septic shock ([place])   COPD mixed type ([place])   CAD (coronary artery disease)   HTN (hypertension)   Chronic combined systolic and diastolic CHF (congestive heart failure) ([place])   HCAP (healthcare-associated pneumonia)   Acute on chronic respiratory failure with hypoxia ([facility])   Multi-organ system dysfunction   Pressure ulcer  Paroxysmal atrial fibrillation Nonspecified abdominal pain Hypoglycemia Possible adrenal sufficiency Acute renal failure Non-ST MI Acute on chronic anemia Chronic pain syndrome Diarrhea negative C. Difficile Severe hypokalemia        Hospital Course Lynn Butler is a 69 y.o. female who presents with respiratory failure and hypoxia. Patient was found unresponsive by her daughter who called EMS and had her brought to the ED. EMS found the patient's O2 sats to be 50. Daughter states that her mother had not been feeling unwell for the past couple of days prior to admission, and was slowly getting worse.  On evaluation in the ED, patient was found to be hypoxic and hypotensive. She was intubated and placed on a vent. She was given aggressive fluid resuscitation. Chest x-ray seems to show pneumonia. On lab evaluation she has what appears to be multiorgan dysfunction with elevated transaminases, elevated troponin, elevated creatinine, and a significantly elevated lactic acid. PH on ABG was 7.19. Hospitalists were called for admission. She was admitted to the ICU. And started on broad-spectrum antibiotics. In terms of her  respiratory failure she was kept on antibiotics and mechanical ventilation. She was subsequently extubated. Currently she is on oxygen which she chronically is on at home. Patient also developed acute renal failure felt to be due to ATN as a result of sepsis. She required CRRT. Her renal function is now normalized. She is not requiring any further dialysis. Patient also was noted to have hypoglycemia and with her hypotension she was thought to have possible adrenal insufficiency and was seen by endocrinology and started on steroids. Patient also was noted to have hypoglycemia on fingerstick however the serum glucose was normal therefore endocrinology felt that the fingersticks were not correct. Patient had no symptoms with very low blood sugar on fingersticks. At this point patient is very debilitated and deconditioned. And is in need of further aggressive rehabilitation. [facility] She is a DO NOT RESUSCITATE she was seen by palliative care team.             Consults  cardiology, pulmonary, pallative care team, nephrolog  Significant Tests:  See full reports for all details      Ct Abdomen Pelvis Wo Contrast  06/30/2015  CLINICAL DATA:  Abdomen pain, weight loss and poor appetite. EXAM: CT ABDOMEN AND PELVIS WITHOUT CONTRAST TECHNIQUE: Multidetector CT imaging of the abdomen and pelvis was performed following the standard protocol without IV contrast. COMPARISON:  June 10 2015 FINDINGS: The liver is, spleen, pancreas, adrenal glands are unremarkable. Patient status post prior cholecystectomy. Small nonobstructing stones are identified in both kidneys. There is no hydronephrosis bilaterally. There is atherosclerosis of the abdominal aorta without aneurysmal dilatation. There is no abdominal lymphadenopathy. The stomach is distended with question thick wall in  the fundus of stomach. There is no small bowel obstruction. In the right mid to the lower  abdomen, there is a colonic loop of bowel with abnormal thickened bowel wall. The visualized colonic loops in the anterior midline abdomen are dilated measuring 5.6 cm. The bladder is fluid filled without abnormality. The uterus is normal. 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 IMPRESSION: In the right mid to lower abdomen, there is a colonic loop of bowel with abnormal thickened wall. The visualized colonic loops in the anterior midline abdomen are dilated measuring 5.6 cm. The findings are nonspecific but can be seen in colitis. However, colonic neoplasm can also have this appearance. Question bowel wall thickening in the fundus of stomach. Electronically Signed   By: Abelardo Diesel M.D.   On: 06/30/2015 16:48   Ct Abdomen Pelvis Wo Contrast  06/10/2015  CLINICAL DATA:  Respiratory arrest. Abnormal abdominal radiograph with distended bowel. EXAM: CT ABDOMEN AND PELVIS WITHOUT CONTRAST TECHNIQUE: Multidetector CT imaging of the abdomen and pelvis was performed following the standard protocol without IV contrast. COMPARISON:  Radiography same day.  CT abdomen 12/28/2014. FINDINGS: There is a pleural effusion on the right. There is volume loss and likely pneumonia in the right lower lobe. Left lung bases largely clear. Mild patchy density in the lingula could represent mild pneumonia. The heart is enlarged. No pericardial fluid. The liver has a normal appearance without contrast. Previous cholecystectomy. The spleen is at the upper limits normal in size. No abnormality of the pancreas is seen. Both kidneys contain numerous small calcifications consistent with nonobstructing stones. No evidence of active renal disease. There is atherosclerosis of the aorta and its branches but no aneurysm. 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 Maximal dimension is 5.6 cm. Nasogastric tube enters the stomach and has its tip in the distal body. The stomach is now decompressed. There are no worrisome dilated loops of intestine presently. Anastomotic bowel sutures are evident in the transverse colon. There has probably been right colectomy. There is a large amount of fecal matter in the rectosigmoid. IMPRESSION: There is no longer any distended bowel. Nasogastric tube in the stomach. No suspicion of ileus or obstruction based on the bowel gas pattern presently. Fairly large amount of stool in the rectosigmoid colon. Right pleural effusion. Probable right lower lobe pneumonia and possible lingular pneumonia. Extensive atherosclerosis. 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 No significant change since the last study when using the same measurement planes. Electronically Signed   By: Nelson Chimes M.D.   On: 06/10/2015 23:55   Dg Chest 1 View  06/20/2015  CLINICAL DATA:  Shortness of Breath EXAM: CHEST  1 VIEW COMPARISON:  None. FINDINGS: Cardiac shadow is enlarged. A defibrillator is again seen and stable. A left jugular central line is been advanced and now lies at the confluence of the innominate veins. Endotracheal tube and nasogastric catheter have been removed in the interval. Mild left retrocardiac atelectasis is seen. Small left-sided pleural effusion is noted. No other focal abnormality is seen. IMPRESSION: Tubes and lines as described. Left lower lobe atelectasis with associated small effusion. Electronically Signed   By: Inez Catalina M.D.   On: 06/20/2015 07:11   Dg Chest 1 View  06/19/2015  CLINICAL DATA:  Patient with history of shortness of breath. EXAM: CHEST 1 VIEW COMPARISON:  Chest radiograph 06/13/2015. FINDINGS: Single lead AICD device overlies the left hemi thorax, leads stable in position. ET tube terminates in the mid trachea. Enteric tube courses  inferior to the diaphragm. Slight interval retraction left internal jugular central venous catheter with tip projecting at the central left internal jugular vein. Stable cardiac and mediastinal contours. Grossly unchanged mid and lower lung heterogeneous opacities favored to represent atelectasis. No large pleural effusion or pneumothorax. IMPRESSION: Interval retraction left IJ central venous catheter with tip projecting over the central internal jugular vein. Consider repositioning as clinically indicated. Otherwise stable support apparatus. Persistent mid and lower lung basilar opacities favored to represent atelectasis. Electronically Signed   By: Lovey Newcomer M.D.   On: 06/19/2015 09:33   Ct Head Wo Contrast  06/11/2015  CLINICAL DATA:  Found unresponsive, altered mental status, intubated EXAM: CT HEAD WITHOUT CONTRAST TECHNIQUE: Contiguous axial images were obtained from the base of the skull through the vertex without contrast. COMPARISON:  06/10/2015 FINDINGS: Stable mild brain atrophy pattern without acute intracranial hemorrhage, mass lesion, definite infarction, hydrocephalus, midline shift, herniation, or extra-axial fluid collection. No focal mass effect or edema. Cisterns remain patent. Cerebellar atrophy as well. Atherosclerosis of the intracranial vessels of the skullbase. Mastoids remain clear. Minor scattered sinus mucosal thickening. Orbits are symmetric. IMPRESSION: Stable atrophy pattern. No interval change or acute process by noncontrast CT Electronically Signed   By: Jerilynn Mages.  Shick M.D.   On: 06/11/2015 09:20   Ct Head Wo Contrast  06/10/2015  CLINICAL DATA:  Patient found unresponsive. Respiratory arrest. Hypoglycemia. Initial encounter. EXAM: CT HEAD WITHOUT CONTRAST TECHNIQUE: Contiguous axial images were obtained from the base of the skull through the vertex without intravenous contrast. COMPARISON:  CT of the head performed 04/07/2015 FINDINGS: There is no evidence of acute infarction,  mass lesion, or intra- or extra-axial hemorrhage on CT. Prominence of the ventricles and sulci suggests mild cortical volume loss. The brainstem and fourth ventricle are within normal limits. The basal ganglia are unremarkable in appearance. The cerebral hemispheres demonstrate grossly normal gray-white differentiation. No mass effect or midline shift is seen. There is no evidence of fracture; visualized osseous structures are unremarkable in appearance. The orbits are within normal limits. Mucosal thickening is noted at the right maxillary sinus. The remaining paranasal sinuses and mastoid air cells are well-aerated. No significant soft tissue abnormalities are seen. IMPRESSION: 1. No acute intracranial pathology seen on CT. 2. Mild cortical volume loss. 3. Mucosal thickening at the right maxillary sinus. Electronically Signed   By: Garald Balding M.D.   On: 06/10/2015 23:48   Dg Chest Port 1 View  06/28/2015  CLINICAL DATA:  Dyspnea.  Altered mental status. EXAM: PORTABLE CHEST 1 VIEW COMPARISON:  06/20/2015 FINDINGS: There is unchanged cardiomegaly. There are intact appearances of the transvenous cardiac leads. Mild central vascular prominence and interstitial thickening have worsened from 06/20/2015. This likely represents a degree of congestive heart failure. No confluent airspace opacity. No large effusions. IMPRESSION: Mild congestive heart failure. Electronically Signed   By: Andreas Newport M.D.   On: 06/28/2015 06:53   Dg Chest Port 1 View  06/13/2015  CLINICAL DATA:  Respiratory failure EXAM: PORTABLE CHEST 1 VIEW COMPARISON:  06/12/2015 FINDINGS: Endotracheal tube tip 15 mm above the carina. Central venous catheter tip in the left innominate vein unchanged. No pneumothorax. AICD unchanged. Mild bibasilar airspace disease shows mild progression and probably is atelectasis. No significant  edema or effusion. IMPRESSION: Endotracheal tube 15 mm above the carina. Increased bibasilar atelectasis.  Electronically Signed   By: Franchot Gallo M.D.   On: 06/13/2015 07:45   Dg Chest Port 1 View  06/12/2015  CLINICAL DATA:  69 year old female with respiratory failure. EXAM: PORTABLE CHEST 1 VIEW COMPARISON:  06/10/2015 and prior radiograph FINDINGS: Cardiomegaly and left AICD again noted. An endotracheal tube with tip 3.7 cm above the carina, left IJ central venous catheter with tip overlying the upper SVC and NG tube entering the stomach with tip off the field of view again noted. Probable small bilateral pleural effusions again noted. There has been little interval change since the prior study. IMPRESSION: Unchanged appearance of the chest with cardiomegaly, probable small bilateral pleural effusions and support apparatus as described. Electronically Signed   By: Margarette Canada M.D.   On: 06/12/2015 08:01   Dg Chest Portable 1 View  06/10/2015  CLINICAL DATA:  Post CVC placement. EXAM: PORTABLE CHEST 1 VIEW COMPARISON:  06/10/2015 FINDINGS: Endotracheal tube is in place with tip in the lower trachea, estimated to be 3.3 cm above the carina. Nasogastric tube is in place with tip off the film but beyond the gastroesophageal junction. Left-sided AICD lead to the right ventricle. A left IJ central line has been placed, tip overlying the level of superior vena cava. Cardiomegaly. Small bilateral pleural effusions. Minimal patchy density at the right lung base persists. No pneumothorax. IMPRESSION: 1. Interval placement of left IJ central line. 2. Stable cardiomegaly.  Minimal right lower lobe infiltrate. Electronically Signed   By: Nolon Nations M.D.   On: 06/10/2015 21:51   Dg Chest Portable 1 View  06/10/2015  CLINICAL DATA:  Patient unresponsive.  Initial encounter. EXAM: PORTABLE CHEST 1 VIEW COMPARISON:  Chest radiograph and CTA of the chest performed 05/21/2015 FINDINGS: The patient's endotracheal tube is seen ending 4 cm above the carina. An enteric tube is noted extending below the diaphragm. Right  basilar airspace opacity is concerning for pneumonia. The left lung appears relatively clear. No pleural effusion or pneumothorax is seen. The cardiomediastinal silhouette is borderline enlarged. An AICD is noted overlying the left chest wall, with a single lead ending overlying the right ventricle. No acute osseous abnormalities are identified. IMPRESSION: 1. Endotracheal tube seen ending 4 cm above the carina. 2. Right basilar airspace opacity is concerning for pneumonia. 3. Borderline cardiomegaly. Electronically Signed   By: Garald Balding M.D.   On: 06/10/2015 20:21   Dg Abd Portable 1v  06/10/2015  CLINICAL DATA:  Patient unresponsive, acute onset. Initial encounter. EXAM: PORTABLE ABDOMEN - 1 VIEW COMPARISON:  CT of the abdomen and pelvis from 12/28/2014 FINDINGS: An enteric tube is noted ending overlying the body of the stomach. There is distention of the stomach and a colonic loop. This may reflect mild ileus. Evaluation for free intra-abdominal air is limited on a single supine view, though no definite free air is seen. Stool is noted within the sigmoid colon. Clips are noted within the right upper quadrant, reflecting prior cholecystectomy. And AICD lead is noted ending overlying the right ventricle. Mild right basilar airspace opacity is seen. IMPRESSION: 1. Enteric tube noted ending overlying the body of the stomach. 2. Distention of the stomach and colonic loops may reflect mild ileus. No definite free intra-abdominal air seen, though evaluation for free air is limited on a single supine view. Stool noted within the sigmoid colon. 3. Mild right basilar airspace opacity is concerning for pneumonia. Electronically  Signed   By: Garald Balding M.D.   On: 06/10/2015 20:20       Today   Subjective:   Lynn Butler  feels better denies any nausea or vomiting anxious to go [facility]  Objective:   Blood pressure 108/58, pulse 106, temperature 98.6 F (37 C), temperature source Oral,  resp. rate 18, height 5\' 2"  (1.575 m), weight 48.9 kg (107 lb 12.9 oz), SpO2 98 %.  .  Intake/Output Summary (Last 24 hours) at 07/05/15 0901 Last data filed at 07/05/15 0700  Gross per 24 hour  Intake    600 ml  Output      0 ml  Net    600 ml    Exam VITAL SIGNS: Blood pressure 108/58, pulse 106, temperature 98.6 F (37 C), temperature source Oral, resp. rate 18, height 5\' 2"  (1.575 m), weight 48.9 kg (107 lb 12.9 oz), SpO2 98 %.  GENERAL:  69 y.o.-year-old patient lying in the bed with no acute distress.  EYES: Pupils equal, round, reactive to light and accommodation. No scleral icterus. Extraocular muscles intact.  HEENT: Head atraumatic, normocephalic. Oropharynx and nasopharynx clear. Forehead she has a chronic lesion likely skin cancer NECK:  Supple, no jugular venous distention. No thyroid enlargement, no tenderness.  LUNGS: Normal breath sounds bilaterally, no wheezing, rales,rhonchi or crepitation. No use of accessory muscles of respiration.  CARDIOVASCULAR: S1, S2 normal. No murmurs, rubs, or gallops.  ABDOMEN: Soft, nontender, nondistended. Bowel sounds present. No organomegaly or mass.  EXTREMITIES: No pedal edema, cyanosis, or clubbing.  NEUROLOGIC: Cranial nerves II through XII are intact. Muscle strength 5/5 in all extremities. Sensation intact. Gait not checked.  PSYCHIATRIC: The patient is alert and oriented x 3.  SKIN: No obvious rash, lesion, or ulcer.   Data Review     CBC w Diff: Lab Results  Component Value Date   WBC 5.1 06/30/2015   WBC 6.9 11/22/2014   HGB 8.2* 07/02/2015   HGB 10.3* 11/22/2014   HCT 27.2* 06/30/2015   HCT 32.9* 11/22/2014   PLT 151 06/30/2015   PLT 189 11/26/2014   LYMPHOPCT 2 06/13/2015   LYMPHOPCT 20.8 11/22/2014   MONOPCT 5 06/13/2015   MONOPCT 11.4 11/22/2014   EOSPCT 0 06/13/2015   EOSPCT 0.3 11/22/2014   BASOPCT 0 06/13/2015   BASOPCT 0.5 11/22/2014   CMP: Lab Results  Component Value Date   NA 139 07/05/2015   NA  139 11/25/2014   K 4.0 07/05/2015   K 3.8 11/25/2014   CL 99* 07/05/2015   CL 103 11/25/2014   CO2 33* 07/05/2015   CO2 31 11/25/2014   BUN 30* 07/05/2015   BUN 25* 11/25/2014   CREATININE 1.27* 07/05/2015   CREATININE 0.66 11/25/2014   PROT 4.8* 06/16/2015   PROT 7.1 11/15/2014   ALBUMIN 1.9* 06/20/2015   ALBUMIN 3.3* 11/15/2014   BILITOT 0.6 06/16/2015   BILITOT 0.6 11/15/2014   ALKPHOS 147* 06/16/2015   ALKPHOS 109 11/15/2014   AST 78* 06/16/2015   AST 23 11/15/2014   ALT 407* 06/16/2015   ALT 14 11/15/2014  .  Micro Results Recent Results (from the past 240 hour(s))  C difficile quick scan w PCR reflex     Status: None   Collection Time: 06/29/15 10:27 AM  Result Value Ref Range Status   C Diff antigen NEGATIVE NEGATIVE Final   C Diff toxin NEGATIVE NEGATIVE Final   C Diff interpretation VALID  Final  Code Status Orders        Start     Ordered   06/19/15 1140  Do not attempt resuscitation (DNR)   Continuous    Question Answer Comment  In the event of cardiac or respiratory ARREST Do not call a "code blue"   In the event of cardiac or respiratory ARREST Do not perform Intubation, CPR, defibrillation or ACLS   In the event of cardiac or respiratory ARREST Use medication by any route, position, wound care, and other measures to relive pain and suffering. May use oxygen, suction and manual treatment of airway obstruction as needed for comfort.      06/19/15 1140          Follow-up Information    Follow up with Stacie Glaze, MD On 07/14/2015.   Specialty:  Internal Medicine   Why:  at 1:00   Contact information:   [address] [place] [id number] [phone]       Follow up with md at snf In 3 days.      Discharge Medications     Medication List    STOP taking these medications        loperamide 2 MG tablet  Commonly known as:  IMODIUM A-D      TAKE these medications        acetaminophen 325 MG  tablet  Commonly known as:  TYLENOL  Take 2 tablets (650 mg total) by mouth every 6 (six) hours as needed for mild pain (or Fever >/= 101).     albuterol 108 (90 BASE) MCG/ACT inhaler  Commonly known as:  PROVENTIL HFA;VENTOLIN HFA  Inhale 2 puffs into the lungs every 4 (four) hours as needed for wheezing or shortness of breath.     aspirin 81 MG chewable tablet  Chew 1 tablet (81 mg total) by mouth daily.     atorvastatin 40 MG tablet  Commonly known as:  LIPITOR  Take 1 tablet (40 mg total) by mouth daily at 6 PM.     benzonatate 200 MG capsule  Commonly known as:  TESSALON  Take 1 capsule (200 mg total) by mouth 3 (three) times daily as needed for cough.     budesonide 0.25 MG/2ML nebulizer solution  Commonly known as:  PULMICORT  Take 2 mLs (0.25 mg total) by nebulization 2 (two) times daily.     carvedilol 3.125 MG tablet  Commonly known as:  COREG  Take 1 tablet (3.125 mg total) by mouth 2 (two) times daily with a meal.     collagenase ointment  Commonly known as:  SANTYL  Apply 1 application topically daily.     diazepam 10 MG tablet  Commonly known as:  VALIUM  Take 1 tablet (10 mg total) by mouth every 12 (twelve) hours as needed for anxiety.     diltiazem 30 MG tablet  Commonly known as:  CARDIZEM  Take 1 tablet (30 mg total) by mouth every 6 (six) hours.     diphenoxylate-atropine 2.5-0.025 MG tablet  Commonly known as:  LOMOTIL  Take 2 tablets by mouth 4 (four) times daily as needed for diarrhea or loose stools.     feeding supplement (ENSURE ENLIVE) Liqd  Take 237 mLs by mouth 3 (three) times daily with meals.     furosemide 40 MG tablet  Commonly known as:  LASIX  Take 1 tablet (40 mg total) by mouth daily.     guaiFENesin 600 MG 12 hr  tablet  Commonly known as:  MUCINEX  Take 1 tablet (600 mg total) by mouth 2 (two) times daily.     hydrocortisone 20 MG tablet  Commonly known as:  CORTEF  Take 1 tablet (20 mg total) by mouth daily.      HYDROmorphone 4 MG tablet  Commonly known as:  DILAUDID  Take 1 tablet (4 mg total) by mouth every 6 (six) hours as needed for severe pain.     ipratropium-albuterol 0.5-2.5 (3) MG/3ML Soln  Commonly known as:  DUONEB  Take 3 mLs by nebulization every 6 (six) hours as needed.     isosorbide mononitrate 30 MG 24 hr tablet  Commonly known as:  IMDUR  Take 1 tablet (30 mg total) by mouth daily.     mometasone-formoterol 100-5 MCG/ACT Aero  Commonly known as:  DULERA  Inhale 2 puffs into the lungs 2 (two) times daily.     oxyCODONE 80 mg 12 hr tablet  Commonly known as:  OXYCONTIN  Take 1 tablet (80 mg total) by mouth every 12 (twelve) hours.     pantoprazole 40 MG tablet  Commonly known as:  PROTONIX  Take 1 tablet (40 mg total) by mouth 2 (two) times daily.     promethazine 25 MG tablet  Commonly known as:  PHENERGAN  Take 25 mg by mouth daily as needed for nausea or vomiting.     tiotropium 18 MCG inhalation capsule  Commonly known as:  SPIRIVA  Place 1 capsule (18 mcg total) into inhaler and inhale daily.     zolpidem 10 MG tablet  Commonly known as:  AMBIEN  Take 5 mg by mouth at bedtime as needed for sleep.           Total Time in preparing paper work, data evaluation and todays exam - 35 minutes  Dustin Flock M.D on 07/05/2015 at 9:01 AM  [facility]   Office  [phone]

## 2015-07-05 NOTE — Clinical Social Work Note (Signed)
Clinical Social Work Assessment  Patient Details  Name: Lynn Butler MRN: [id number] Date of Birth: 10-04-1945  Date of referral:  07/05/15               Reason for consult:  Facility Placement                Permission sought to share information with:  Family Supports Permission granted to share information::  Yes, Verbal Permission Granted  Name::      (Daughter)   Housing/Transportation Living arrangements for the past 2 months:  [place] of Information:  Patient Patient Interpreter Needed:  None Criminal Activity/Legal Involvement Pertinent to Current Situation/Hospitalization:  No - Comment as needed Significant Relationships:  None Lives with:  Self Do you feel safe going back to the place where you live?  No (Needs a higher level of care. ) Need for family participation in patient care:   Yes  Care giving concerns:  Pt is in need of a higher level of care.    Social Worker assessment / plan:  CSW met with pt to address discharge plan. Pt shared that she remembered CSW from previous meeting. CSW has secured a bed at [facility]. Pt is ready for discharge today. CSW notified the facility. CSW will send discharge information when availale. Pt stated that she called her daughter and she is on her way to the unit.   CSW will continue to follow.   Employment status:  Retired Forensic scientist:  [employer] PT Recommendations:  [place] / Referral to community resources:  [place]  Patient/Family's Response to care:  Pt was appreciative of CSW support  Patient/Family's Understanding of and Emotional Response to Diagnosis, Current Treatment, and Prognosis:  Pt understands that she needs more assistance   Emotional Assessment Appearance:  Appears older than stated age Attitude/Demeanor/Rapport:   (appropriate) Affect (typically observed):  Accepting Orientation:   O&A x4 (person, place, time, and  situation Alcohol / Substance use:  Never Used Psych involvement (Current and /or in the community):  No (Comment)  Discharge Needs  Concerns to be addressed:  No discharge needs identified Readmission within the last 30 days:  No Current discharge risk:  None Barriers to Discharge:  No Barriers Identified   [name], LCSW 07/05/2015, 10:27 AM

## 2015-07-05 NOTE — Plan of Care (Signed)
Problem: Nutrition: Goal: Adequate nutrition will be maintained Outcome: Progressing PRN medication for pain with some relief. On isolation for MRSA. Possible d/c in am. Continue to monitor.

## 2015-07-05 NOTE — Discharge Instructions (Signed)
°  DIET:  Cardiac diet  DISCHARGE CONDITION:  Stable  ACTIVITY:  Activity as tolerated  OXYGEN:  Home Oxygen: Yes.     Oxygen Delivery: 2 liters/min via Patient connected to nasal cannula oxygen  DISCHARGE LOCATION:  nursing home    ADDITIONAL DISCHARGE INSTRUCTION:pt eval and treat   If you experience worsening of your admission symptoms, develop shortness of breath, life threatening emergency, suicidal or homicidal thoughts you must seek medical attention immediately by calling 911 or calling your MD immediately  if symptoms less severe.  You Must read complete instructions/literature along with all the possible adverse reactions/side effects for all the Medicines you take and that have been prescribed to you. Take any new Medicines after you have completely understood and accpet all the possible adverse reactions/side effects.   Please note  You were cared for by a hospitalist during your hospital stay. If you have any questions about your discharge medications or the care you received while you were in the hospital after you are discharged, you can call the unit and asked to speak with the hospitalist on call if the hospitalist that took care of you is not available. Once you are discharged, your primary care physician will handle any further medical issues. Please note that NO REFILLS for any discharge medications will be authorized once you are discharged, as it is imperative that you return to your primary care physician (or establish a relationship with a primary care physician if you do not have one) for your aftercare needs so that they can reassess your need for medications and monitor your lab values.

## 2015-07-05 NOTE — Progress Notes (Signed)
Pt to be discharged to [facility] this pm. Alert. 02 in use 2-3 l [place].  Dressing change earlier this am to left foot. Triple lumen and   Rt  Sided neck iv d/cd.  Pt to go by ems. Report called [facility]

## 2015-07-05 NOTE — Clinical Social Work Note (Signed)
Pt is ready for discharge today to [facility]. Pt is aware and agreeable to discharge plan. CSW sent all discharge information to facility. Facility is ready to accept pt. CSW left a message with pt's daughter requesting a call back. RN to call report and EMS will provide transportation. CSW is signing off as no further needs identified.   [name], MSW, LCSW Clinical Social Worker  [phone]

## 2015-07-05 NOTE — Progress Notes (Signed)
[facility]  ROUNDING NOTE   Subjective:   Creatinine 1.27 (1.38) K 4 (5.2) On Cipro  Objective:  Vital signs in last 24 hours:  Temp:  [98.5 F (36.9 C)-98.9 F (37.2 C)] 98.5 F (36.9 C) (12/06 1237) Pulse Rate:  [95-106] 104 (12/06 1237) Resp:  [18-20] 20 (12/06 1237) BP: (97-131)/(50-63) 97/52 mmHg (12/06 1237) SpO2:  [96 %-100 %] 100 % (12/06 1237)  Weight change:  Filed Weights   06/25/15 1505 06/28/15 0945 06/28/15 1330  Weight: 56.7 kg (125 lb) 50.4 kg (111 lb 1.8 oz) 48.9 kg (107 lb 12.9 oz)    Intake/Output: I/O last 3 completed shifts: In: 600 [I.V.:600] Out: -    Intake/Output this shift:     Physical Exam: General: Chronically ill appearing  Head: Healing wound on forehead  ENT: Moist oral mucus membranes  Neck: Supple  Lungs:  Bilateral rhonchi, normal effort  Heart: S1S2 no rubs  Abdomen:  Soft, nontender, BS present   Extremities:  trace peripheral edema.  Neurologic: Awake, alert, following commands  Skin: Large forehead scar from prior skin cancer       Basic Metabolic Panel:  Recent Labs Lab 07/01/15 0557 07/02/15 0450 07/03/15 0646 07/04/15 0603 07/05/15 0557  NA 142 141 142 145 139  K 3.0* 2.4* 2.7* 5.2* 4.0  CL 99* 97* 99* 103 99*  CO2 35* 37* 35* 37* 33*  GLUCOSE 88 86 99 85 77  BUN 32* 32* 32* 36* 30*  CREATININE 1.70* 1.62* 1.35* 1.38* 1.27*  CALCIUM 7.4* 6.9* 7.0* 7.7* 6.9*  MG 1.3*  --   --   --   --     Liver Function Tests: No results for input(s): AST, ALT, ALKPHOS, BILITOT, PROT, ALBUMIN in the last 168 hours. No results for input(s): LIPASE, AMYLASE in the last 168 hours. No results for input(s): AMMONIA in the last 168 hours.  CBC:  Recent Labs Lab 06/30/15 1345 07/02/15 0450  WBC 5.1  --   HGB 8.7* 8.2*  HCT 27.2*  --   MCV 77.6*  --   PLT 151  --     Cardiac Enzymes: No results for input(s): CKTOTAL, CKMB, CKMBINDEX, TROPONINI in the last 168 hours.  BNP: Invalid input(s):  POCBNP  CBG:  Recent Labs Lab 07/03/15 2205 07/04/15 0722 07/04/15 0752 07/04/15 0841 07/04/15 1114  GLUCAP 65 39* 28* 110* 95    Microbiology: Results for orders placed or performed during the hospital encounter of 06/10/15  Urine culture     Status: None   Collection Time: 06/10/15  7:30 PM  Result Value Ref Range Status   Specimen Description URINE, RANDOM  Final   Special Requests NONE  Final   Culture NO GROWTH 2 DAYS  Final   Report Status 06/12/2015 FINAL  Final  Blood Culture (routine x 2)     Status: None   Collection Time: 06/10/15  8:10 PM  Result Value Ref Range Status   Specimen Description BLOOD RIGHT  Final   Special Requests BOTTLES DRAWN AEROBIC AND ANAEROBIC 5CC  Final   Culture  Setup Time   Final    GRAM POSITIVE COCCI AEROBIC BOTTLE ONLY CRITICAL RESULT CALLED TO, READ BACK BY AND VERIFIED WITH: RN Kathi Simpers 06/14/15 1358    Culture   Final    COAGULASE NEGATIVE STAPHYLOCOCCUS Results consistent with contamination.    Report Status 06/16/2015 FINAL  Final  Blood Culture (routine x 2)     Status: None  Collection Time: 06/10/15  8:20 PM  Result Value Ref Range Status   Specimen Description BLOOD RIGHT  Final   Special Requests BOTTLES DRAWN AEROBIC AND ANAEROBIC 2CC AERO, 4CC  Final   Culture NO GROWTH 5 DAYS  Final   Report Status 06/15/2015 FINAL  Final  MRSA PCR Screening     Status: Abnormal   Collection Time: 06/11/15  1:40 AM  Result Value Ref Range Status   MRSA by PCR POSITIVE (A) NEGATIVE Final    Comment:        The GeneXpert MRSA Assay (FDA approved for NASAL specimens only), is one component of a comprehensive MRSA colonization surveillance program. It is not intended to diagnose MRSA infection nor to guide or monitor treatment for MRSA infections. CRITICAL RESULT CALLED TO, READ BACK BY AND VERIFIED WITH: [facility] ALLEN AT [id number] 06/11/15.PMH   Culture, sputum-assessment     Status: None   Collection Time:  06/11/15  4:30 AM  Result Value Ref Range Status   Specimen Description TRACHEAL ASPIRATE  Final   Special Requests NONE  Final   Sputum evaluation THIS SPECIMEN IS ACCEPTABLE FOR SPUTUM CULTURE  Final   Report Status 06/11/2015 FINAL  Final  Culture, respiratory (NON-Expectorated)     Status: None   Collection Time: 06/11/15  4:30 AM  Result Value Ref Range Status   Specimen Description TRACHEAL ASPIRATE  Final   Special Requests NONE Reflexed from [id number]  Final   Gram Stain   Final    GOOD SPECIMEN - 80-90% WBCS MODERATE WBC SEEN MODERATE GRAM POSITIVE COCCI RARE GRAM NEGATIVE RODS    Culture HEAVY GROWTH STREPTOCOCCUS PNEUMONIAE  Final   Report Status 06/13/2015 FINAL  Final   Organism ID, Bacteria STREPTOCOCCUS PNEUMONIAE  Final      Susceptibility   Streptococcus pneumoniae - MIC*    ERYTHROMYCIN <=0.12 SENSITIVE Sensitive     VANCOMYCIN 0.5 SENSITIVE Sensitive     TRIMETH/SULFA <=10 SENSITIVE Sensitive     CLINDAMYCIN <=0.25 SENSITIVE Sensitive     LEVOFLOXACIN Value in next row Sensitive      SENSITIVE1    LINEZOLID Value in next row Sensitive      SENSITIVE<=2    * HEAVY GROWTH STREPTOCOCCUS PNEUMONIAE  C difficile quick scan w PCR reflex     Status: None   Collection Time: 06/20/15  4:30 AM  Result Value Ref Range Status   C Diff antigen NEGATIVE NEGATIVE Final   C Diff toxin NEGATIVE NEGATIVE Final   C Diff interpretation Negative for C. difficile  Final  C difficile quick scan w PCR reflex     Status: None   Collection Time: 06/29/15 10:27 AM  Result Value Ref Range Status   C Diff antigen NEGATIVE NEGATIVE Final   C Diff toxin NEGATIVE NEGATIVE Final   C Diff interpretation VALID  Final    Coagulation Studies: No results for input(s): LABPROT, INR in the last 72 hours.  Urinalysis: No results for input(s): COLORURINE, LABSPEC, PHURINE, GLUCOSEU, HGBUR, BILIRUBINUR, KETONESUR, PROTEINUR, UROBILINOGEN, NITRITE, LEUKOCYTESUR in the last 72 hours.  Invalid  input(s): APPERANCEUR    Imaging: No results found.   Medications:     . aspirin  81 mg Oral Daily  . atorvastatin  40 mg Oral q1800  . budesonide  0.25 mg Nebulization BID  . ciprofloxacin  250 mg Oral BID  . diltiazem  30 mg Oral 4 times per day  . feeding supplement (ENSURE ENLIVE)  237 mL Oral  TID  . furosemide  40 mg Oral Daily  . hydrocortisone  10 mg Oral Daily  . hydrocortisone  20 mg Oral Daily  . ipratropium-albuterol  3 mL Nebulization TID  . metoCLOPramide (REGLAN) injection  5 mg Intravenous 3 times per day  . metroNIDAZOLE  250 mg Oral 3 times per day  . oxyCODONE  80 mg Oral Q12H  . pantoprazole  40 mg Oral BID  . sodium chloride  10-40 mL Intracatheter Q12H  . sodium chloride  3 mL Intravenous Q12H   acetaminophen **OR** [DISCONTINUED] acetaminophen, alteplase, diazepam, diphenoxylate-atropine, glycopyrrolate, guaiFENesin, heparin, HYDROmorphone, lidocaine (PF), lidocaine-prilocaine, morphine injection, pentafluoroprop-tetrafluoroeth, polyvinyl alcohol, promethazine, senna-docusate, sodium chloride, sodium chloride, zolpidem  Assessment/ Plan:  69 y.o. female  with a PMHx of COPD, hypertension, ischemic cardiomyopathy with multiple coronary artery stents, history of ventricular tachycardia, peripheral vascular disease, hyperlipidemia, chronic back pain, history of congestive heart failure, who was admitted to [facility] on 06/10/2015 for evaluation of respiratory failure.   1. Acute renal failure secondary to acute tubular necrosis. Patient presented with multiorgan failure upon presentation. Baseline creatinine 0.75. - renal function continues to improve.  Creatinine down to 1.27.  Temporary dialysis catheter has been discontinued. No further need for dialysis at this time.   - will need outpatient follow up.   2. Hypokalemia. Now over corrected and with hyperkalemia.   3. Acute respiratory failure.  - patient continues to do well off the ventilator.  4. Acute  myocardial infarction.  Appears resolved.  5.  Anemia unspecified: hemoglobin currently 8.2.  Continue to monitor as an outpatient.  LOS: [place], [place] 12/6/20162:49 PM

## 2015-07-17 ENCOUNTER — Emergency Department: Payer: Medicare Other

## 2015-07-17 ENCOUNTER — Inpatient Hospital Stay
Admission: EM | Admit: 2015-07-17 | Discharge: 2015-07-31 | DRG: 853 | Disposition: E | Payer: Medicare Other | Attending: General Surgery | Admitting: General Surgery

## 2015-07-17 ENCOUNTER — Inpatient Hospital Stay: Payer: Medicare Other

## 2015-07-17 ENCOUNTER — Encounter: Admission: EM | Disposition: E | Payer: Self-pay | Source: Home / Self Care | Attending: General Surgery

## 2015-07-17 ENCOUNTER — Inpatient Hospital Stay: Payer: Medicare Other | Admitting: Anesthesiology

## 2015-07-17 ENCOUNTER — Encounter: Payer: Self-pay | Admitting: Emergency Medicine

## 2015-07-17 DIAGNOSIS — J9690 Respiratory failure, unspecified, unspecified whether with hypoxia or hypercapnia: Secondary | ICD-10-CM

## 2015-07-17 DIAGNOSIS — Z8679 Personal history of other diseases of the circulatory system: Secondary | ICD-10-CM

## 2015-07-17 DIAGNOSIS — I739 Peripheral vascular disease, unspecified: Secondary | ICD-10-CM | POA: Diagnosis present

## 2015-07-17 DIAGNOSIS — E860 Dehydration: Secondary | ICD-10-CM | POA: Diagnosis present

## 2015-07-17 DIAGNOSIS — J9620 Acute and chronic respiratory failure, unspecified whether with hypoxia or hypercapnia: Secondary | ICD-10-CM | POA: Diagnosis present

## 2015-07-17 DIAGNOSIS — Z955 Presence of coronary angioplasty implant and graft: Secondary | ICD-10-CM

## 2015-07-17 DIAGNOSIS — R1084 Generalized abdominal pain: Secondary | ICD-10-CM | POA: Diagnosis not present

## 2015-07-17 DIAGNOSIS — K631 Perforation of intestine (nontraumatic): Secondary | ICD-10-CM | POA: Diagnosis present

## 2015-07-17 DIAGNOSIS — Z66 Do not resuscitate: Secondary | ICD-10-CM | POA: Diagnosis present

## 2015-07-17 DIAGNOSIS — Z79899 Other long term (current) drug therapy: Secondary | ICD-10-CM

## 2015-07-17 DIAGNOSIS — J45909 Unspecified asthma, uncomplicated: Secondary | ICD-10-CM | POA: Diagnosis present

## 2015-07-17 DIAGNOSIS — R579 Shock, unspecified: Secondary | ICD-10-CM | POA: Diagnosis present

## 2015-07-17 DIAGNOSIS — Z9049 Acquired absence of other specified parts of digestive tract: Secondary | ICD-10-CM

## 2015-07-17 DIAGNOSIS — Z4659 Encounter for fitting and adjustment of other gastrointestinal appliance and device: Secondary | ICD-10-CM

## 2015-07-17 DIAGNOSIS — M549 Dorsalgia, unspecified: Secondary | ICD-10-CM | POA: Diagnosis present

## 2015-07-17 DIAGNOSIS — J449 Chronic obstructive pulmonary disease, unspecified: Secondary | ICD-10-CM | POA: Diagnosis present

## 2015-07-17 DIAGNOSIS — Z888 Allergy status to other drugs, medicaments and biological substances status: Secondary | ICD-10-CM

## 2015-07-17 DIAGNOSIS — N179 Acute kidney failure, unspecified: Secondary | ICD-10-CM | POA: Diagnosis present

## 2015-07-17 DIAGNOSIS — N390 Urinary tract infection, site not specified: Secondary | ICD-10-CM | POA: Diagnosis present

## 2015-07-17 DIAGNOSIS — E2740 Unspecified adrenocortical insufficiency: Secondary | ICD-10-CM | POA: Diagnosis present

## 2015-07-17 DIAGNOSIS — J9621 Acute and chronic respiratory failure with hypoxia: Secondary | ICD-10-CM

## 2015-07-17 DIAGNOSIS — Z1639 Resistance to other specified antimicrobial drug: Secondary | ICD-10-CM | POA: Diagnosis present

## 2015-07-17 DIAGNOSIS — R69 Illness, unspecified: Secondary | ICD-10-CM | POA: Diagnosis not present

## 2015-07-17 DIAGNOSIS — I130 Hypertensive heart and chronic kidney disease with heart failure and stage 1 through stage 4 chronic kidney disease, or unspecified chronic kidney disease: Secondary | ICD-10-CM | POA: Diagnosis present

## 2015-07-17 DIAGNOSIS — G8929 Other chronic pain: Secondary | ICD-10-CM | POA: Diagnosis present

## 2015-07-17 DIAGNOSIS — K559 Vascular disorder of intestine, unspecified: Secondary | ICD-10-CM | POA: Insufficient documentation

## 2015-07-17 DIAGNOSIS — I5042 Chronic combined systolic (congestive) and diastolic (congestive) heart failure: Secondary | ICD-10-CM | POA: Diagnosis present

## 2015-07-17 DIAGNOSIS — Z859 Personal history of malignant neoplasm, unspecified: Secondary | ICD-10-CM

## 2015-07-17 DIAGNOSIS — F17210 Nicotine dependence, cigarettes, uncomplicated: Secondary | ICD-10-CM | POA: Diagnosis present

## 2015-07-17 DIAGNOSIS — Z8701 Personal history of pneumonia (recurrent): Secondary | ICD-10-CM | POA: Diagnosis not present

## 2015-07-17 DIAGNOSIS — I255 Ischemic cardiomyopathy: Secondary | ICD-10-CM | POA: Diagnosis present

## 2015-07-17 DIAGNOSIS — A419 Sepsis, unspecified organism: Principal | ICD-10-CM | POA: Diagnosis present

## 2015-07-17 DIAGNOSIS — Z9889 Other specified postprocedural states: Secondary | ICD-10-CM | POA: Diagnosis not present

## 2015-07-17 DIAGNOSIS — R1030 Lower abdominal pain, unspecified: Secondary | ICD-10-CM | POA: Diagnosis present

## 2015-07-17 DIAGNOSIS — E872 Acidosis: Secondary | ICD-10-CM | POA: Diagnosis present

## 2015-07-17 DIAGNOSIS — I2510 Atherosclerotic heart disease of native coronary artery without angina pectoris: Secondary | ICD-10-CM | POA: Diagnosis present

## 2015-07-17 DIAGNOSIS — Z803 Family history of malignant neoplasm of breast: Secondary | ICD-10-CM

## 2015-07-17 DIAGNOSIS — B952 Enterococcus as the cause of diseases classified elsewhere: Secondary | ICD-10-CM | POA: Diagnosis present

## 2015-07-17 DIAGNOSIS — Z8249 Family history of ischemic heart disease and other diseases of the circulatory system: Secondary | ICD-10-CM | POA: Diagnosis not present

## 2015-07-17 DIAGNOSIS — N184 Chronic kidney disease, stage 4 (severe): Secondary | ICD-10-CM | POA: Diagnosis present

## 2015-07-17 DIAGNOSIS — R109 Unspecified abdominal pain: Secondary | ICD-10-CM | POA: Diagnosis present

## 2015-07-17 DIAGNOSIS — Z7982 Long term (current) use of aspirin: Secondary | ICD-10-CM

## 2015-07-17 DIAGNOSIS — Z978 Presence of other specified devices: Secondary | ICD-10-CM

## 2015-07-17 DIAGNOSIS — I252 Old myocardial infarction: Secondary | ICD-10-CM | POA: Diagnosis not present

## 2015-07-17 DIAGNOSIS — G9341 Metabolic encephalopathy: Secondary | ICD-10-CM | POA: Diagnosis present

## 2015-07-17 DIAGNOSIS — R102 Pelvic and perineal pain: Secondary | ICD-10-CM

## 2015-07-17 HISTORY — PX: LAPAROTOMY: SHX154

## 2015-07-17 LAB — BLOOD GAS, ARTERIAL
ACID-BASE DEFICIT: 10.8 mmol/L — AB (ref 0.0–2.0)
Acid-base deficit: 13.4 mmol/L — ABNORMAL HIGH (ref 0.0–2.0)
Acid-base deficit: 14.9 mmol/L — ABNORMAL HIGH (ref 0.0–2.0)
Allens test (pass/fail): POSITIVE — AB
BICARBONATE: 18.5 meq/L — AB (ref 21.0–28.0)
BICARBONATE: 15.9 meq/L — AB (ref 21.0–28.0)
BICARBONATE: 14.3 meq/L — AB (ref 21.0–28.0)
FIO2: 0.4
FIO2: 0.4
FIO2: 0.4
LHR: 22 {breaths}/min
MECHANICAL RATE: 22
MECHVT: 410 mL
MECHVT: 410 mL
Mechanical Rate: 18
O2 Saturation: 96.4 %
O2 Saturation: 97.2 %
O2 Saturation: 96.8 %
PATIENT TEMPERATURE: 37
PEEP/CPAP: 5 cmH2O
PEEP: 5 cmH2O
PEEP: 5 cmH2O
PO2 ART: 120 mmHg — AB (ref 83.0–108.0)
PO2 ART: 116 mmHg — AB (ref 83.0–108.0)
Patient temperature: 37
Patient temperature: 37
RATE: 14 resp/min
VT: 450 mL
pCO2 arterial: 57 mmHg — ABNORMAL HIGH (ref 32.0–48.0)
pCO2 arterial: 49 mmHg — ABNORMAL HIGH (ref 32.0–48.0)
pCO2 arterial: 45 mmHg (ref 32.0–48.0)
pH, Arterial: 7.12 — CL (ref 7.350–7.450)
pH, Arterial: 7.12 — CL (ref 7.350–7.450)
pH, Arterial: 7.11 — CL (ref 7.350–7.450)
pO2, Arterial: 110 mmHg — ABNORMAL HIGH (ref 83.0–108.0)

## 2015-07-17 LAB — URINALYSIS COMPLETE WITH MICROSCOPIC (ARMC ONLY)
Bilirubin Urine: NEGATIVE
GLUCOSE, UA: NEGATIVE mg/dL
Hgb urine dipstick: NEGATIVE
Ketones, ur: NEGATIVE mg/dL
Nitrite: NEGATIVE
Protein, ur: 30 mg/dL — AB
Specific Gravity, Urine: 1.016 (ref 1.005–1.030)
pH: 5 (ref 5.0–8.0)

## 2015-07-17 LAB — BASIC METABOLIC PANEL
Anion gap: 8 (ref 5–15)
BUN: 33 mg/dL — ABNORMAL HIGH (ref 6–20)
CALCIUM: 6.5 mg/dL — AB (ref 8.9–10.3)
CO2: 20 mmol/L — AB (ref 22–32)
CREATININE: 1.73 mg/dL — AB (ref 0.44–1.00)
Chloride: 112 mmol/L — ABNORMAL HIGH (ref 101–111)
GFR, EST AFRICAN AMERICAN: 34 mL/min — AB (ref >60)
GFR, EST NON AFRICAN AMERICAN: 29 mL/min — AB (ref >60)
Glucose, Bld: 204 mg/dL — ABNORMAL HIGH (ref 65–99)
Potassium: 3.5 mmol/L (ref 3.5–5.1)
SODIUM: 140 mmol/L (ref 135–145)

## 2015-07-17 LAB — CBC WITH DIFFERENTIAL/PLATELET
BASOS ABS: 0.1 10*3/uL (ref 0–0.1)
Basophils Relative: 0 %
EOS PCT: 0 %
Eosinophils Absolute: 0 10*3/uL (ref 0–0.7)
HEMATOCRIT: 37.6 % (ref 35.0–47.0)
Hemoglobin: 11.8 g/dL — ABNORMAL LOW (ref 12.0–16.0)
LYMPHS ABS: 3.6 10*3/uL (ref 1.0–3.6)
LYMPHS PCT: 18 %
MCH: 25.3 pg — AB (ref 26.0–34.0)
MCHC: 31.3 g/dL — AB (ref 32.0–36.0)
MCV: 80.8 fL (ref 80.0–100.0)
MONO ABS: 0.9 10*3/uL (ref 0.2–0.9)
MONOS PCT: 4 %
NEUTROS ABS: 15.9 10*3/uL — AB (ref 1.4–6.5)
Neutrophils Relative %: 78 %
Platelets: 223 10*3/uL (ref 150–440)
RBC: 4.65 MIL/uL (ref 3.80–5.20)
RDW: 27.6 % — AB (ref 11.5–14.5)
WBC: 20.4 10*3/uL — ABNORMAL HIGH (ref 3.6–11.0)

## 2015-07-17 LAB — COMPREHENSIVE METABOLIC PANEL
ALBUMIN: 1.7 g/dL — AB (ref 3.5–5.0)
ALT: 11 U/L — ABNORMAL LOW (ref 14–54)
ANION GAP: 9 (ref 5–15)
AST: 22 U/L (ref 15–41)
Alkaline Phosphatase: 69 U/L (ref 38–126)
BILIRUBIN TOTAL: 0.8 mg/dL (ref 0.3–1.2)
BUN: 32 mg/dL — AB (ref 6–20)
CHLORIDE: 113 mmol/L — AB (ref 101–111)
CO2: 19 mmol/L — AB (ref 22–32)
Calcium: 7.2 mg/dL — ABNORMAL LOW (ref 8.9–10.3)
Creatinine, Ser: 1.94 mg/dL — ABNORMAL HIGH (ref 0.44–1.00)
GFR calc Af Amer: 29 mL/min — ABNORMAL LOW (ref >60)
GFR calc non Af Amer: 25 mL/min — ABNORMAL LOW (ref >60)
GLUCOSE: 91 mg/dL (ref 65–99)
POTASSIUM: 3 mmol/L — AB (ref 3.5–5.1)
SODIUM: 141 mmol/L (ref 135–145)
TOTAL PROTEIN: 4.8 g/dL — AB (ref 6.5–8.1)

## 2015-07-17 LAB — GLUCOSE, CAPILLARY
Glucose-Capillary: 37 mg/dL — CL (ref 65–99)
Glucose-Capillary: 42 mg/dL — CL (ref 65–99)

## 2015-07-17 LAB — LACTIC ACID, PLASMA
LACTIC ACID, VENOUS: 7.6 mmol/L — AB (ref 0.5–2.0)
Lactic Acid, Venous: 3.4 mmol/L (ref 0.5–2.0)

## 2015-07-17 LAB — PHOSPHORUS: PHOSPHORUS: 7.6 mg/dL — AB (ref 2.5–4.6)

## 2015-07-17 LAB — MAGNESIUM: MAGNESIUM: 1.2 mg/dL — AB (ref 1.7–2.4)

## 2015-07-17 LAB — MRSA PCR SCREENING: MRSA by PCR: POSITIVE — AB

## 2015-07-17 SURGERY — LAPAROTOMY, EXPLORATORY
Anesthesia: General | Wound class: Contaminated

## 2015-07-17 MED ORDER — ANTISEPTIC ORAL RINSE SOLUTION (CORINZ)
7.0000 mL | OROMUCOSAL | Status: DC
Start: 2015-07-17 — End: 2015-07-18
  Administered 2015-07-17 – 2015-07-18 (×3): 7 mL via OROMUCOSAL
  Filled 2015-07-17 (×16): qty 7

## 2015-07-17 MED ORDER — LACTATED RINGERS IV SOLN
INTRAVENOUS | Status: DC
  Administered 2015-07-17: 18:00:00 via INTRAVENOUS

## 2015-07-17 MED ORDER — SODIUM CHLORIDE 0.9 % IV SOLN: INTRAVENOUS | Status: DC

## 2015-07-17 MED ORDER — DEXAMETHASONE SODIUM PHOSPHATE 4 MG/ML IJ SOLN
4.0000 mg | Freq: Four times a day (QID) | INTRAMUSCULAR | Status: DC
  Filled 2015-07-17 (×4): qty 1

## 2015-07-17 MED ORDER — MAGNESIUM SULFATE 4 GM/100ML IV SOLN
4.0000 g | Freq: Once | INTRAVENOUS | Status: AC
  Administered 2015-07-17: 4 g via INTRAVENOUS
  Filled 2015-07-17: qty 100

## 2015-07-17 MED ORDER — FENTANYL CITRATE (PF) 100 MCG/2ML IJ SOLN
50.0000 ug | Freq: Once | INTRAMUSCULAR | Status: AC
  Administered 2015-07-17: 50 ug via INTRAVENOUS

## 2015-07-17 MED ORDER — SODIUM CHLORIDE 0.9 % IV SOLN
INTRAVENOUS | Status: DC
Start: 2015-07-17 — End: 2015-07-17

## 2015-07-17 MED ORDER — FENTANYL BOLUS VIA INFUSION
25.0000 ug | INTRAVENOUS | Status: DC | PRN
  Filled 2015-07-17: qty 25

## 2015-07-17 MED ORDER — FENTANYL CITRATE (PF) 100 MCG/2ML IJ SOLN: 25.0000 ug | INTRAMUSCULAR | Status: DC | PRN

## 2015-07-17 MED ORDER — SUCCINYLCHOLINE CHLORIDE 20 MG/ML IJ SOLN
INTRAMUSCULAR | Status: DC | PRN
  Administered 2015-07-17: 80 mg via INTRAVENOUS

## 2015-07-17 MED ORDER — ONDANSETRON HCL 4 MG PO TABS: 4.0000 mg | ORAL_TABLET | Freq: Four times a day (QID) | ORAL | Status: DC | PRN

## 2015-07-17 MED ORDER — SODIUM BICARBONATE 8.4 % IV SOLN
INTRAVENOUS | Status: DC
  Administered 2015-07-17: 21:00:00 via INTRAVENOUS
  Filled 2015-07-17 (×2): qty 100

## 2015-07-17 MED ORDER — LACTATED RINGERS IV SOLN
INTRAVENOUS | Status: DC | PRN
  Administered 2015-07-17 (×3): via INTRAVENOUS

## 2015-07-17 MED ORDER — ROCURONIUM BROMIDE 100 MG/10ML IV SOLN
INTRAVENOUS | Status: DC | PRN
  Administered 2015-07-17: 10 mg via INTRAVENOUS
  Administered 2015-07-17 (×2): 20 mg via INTRAVENOUS

## 2015-07-17 MED ORDER — ONDANSETRON HCL 4 MG/2ML IJ SOLN: 4.0000 mg | Freq: Four times a day (QID) | INTRAMUSCULAR | Status: DC | PRN

## 2015-07-17 MED ORDER — VASOPRESSIN 20 UNIT/ML IV SOLN
0.0300 [IU]/min | INTRAVENOUS | Status: DC
  Administered 2015-07-17: 0.03 [IU]/min via INTRAVENOUS
  Filled 2015-07-17 (×2): qty 2

## 2015-07-17 MED ORDER — VASOPRESSIN 20 UNIT/ML IV SOLN
INTRAVENOUS | Status: DC | PRN
  Administered 2015-07-17 (×2): 2 [IU] via INTRAVENOUS

## 2015-07-17 MED ORDER — FENTANYL CITRATE (PF) 100 MCG/2ML IJ SOLN
50.0000 ug | INTRAMUSCULAR | Status: DC | PRN
  Administered 2015-07-17: 50 ug via INTRAVENOUS

## 2015-07-17 MED ORDER — ETOMIDATE 2 MG/ML IV SOLN
INTRAVENOUS | Status: DC | PRN
  Administered 2015-07-17: 10 mg via INTRAVENOUS

## 2015-07-17 MED ORDER — PHENYLEPHRINE HCL 10 MG/ML IJ SOLN
INTRAMUSCULAR | Status: DC | PRN
  Administered 2015-07-17 (×5): 100 ug via INTRAVENOUS

## 2015-07-17 MED ORDER — ANTISEPTIC ORAL RINSE SOLUTION (CORINZ)
7.0000 mL | Freq: Four times a day (QID) | OROMUCOSAL | Status: DC
  Filled 2015-07-17 (×6): qty 7

## 2015-07-17 MED ORDER — BUDESONIDE 0.25 MG/2ML IN SUSP: 0.2500 mg | Freq: Two times a day (BID) | RESPIRATORY_TRACT | Status: DC

## 2015-07-17 MED ORDER — SODIUM BICARBONATE 8.4 % IV SOLN
50.0000 meq | Freq: Once | INTRAVENOUS | Status: AC
  Administered 2015-07-17: 50 meq via INTRAVENOUS
  Filled 2015-07-17: qty 50

## 2015-07-17 MED ORDER — MIDAZOLAM HCL 2 MG/2ML IJ SOLN: 1.0000 mg | INTRAMUSCULAR | Status: DC | PRN

## 2015-07-17 MED ORDER — DIPHENHYDRAMINE HCL 50 MG/ML IJ SOLN: 12.5000 mg | Freq: Four times a day (QID) | INTRAMUSCULAR | Status: DC | PRN

## 2015-07-17 MED ORDER — PIPERACILLIN-TAZOBACTAM 3.375 G IVPB 30 MIN: 3.3750 g | Freq: Three times a day (TID) | INTRAVENOUS | Status: DC

## 2015-07-17 MED ORDER — FENTANYL CITRATE (PF) 100 MCG/2ML IJ SOLN
INTRAMUSCULAR | Status: AC
  Administered 2015-07-17: 50 ug via INTRAVENOUS
  Filled 2015-07-17: qty 2

## 2015-07-17 MED ORDER — DEXTROSE 50 % IV SOLN
INTRAVENOUS | Status: AC
  Administered 2015-07-17: 50 mL via INTRAVENOUS
  Filled 2015-07-17: qty 50

## 2015-07-17 MED ORDER — SODIUM CHLORIDE 0.9 % IV SOLN
INTRAVENOUS | Status: AC
  Administered 2015-07-17: 21:00:00 via INTRAVENOUS

## 2015-07-17 MED ORDER — ONDANSETRON HCL 4 MG/2ML IJ SOLN: 4.0000 mg | Freq: Once | INTRAMUSCULAR | Status: DC | PRN

## 2015-07-17 MED ORDER — FENTANYL CITRATE (PF) 100 MCG/2ML IJ SOLN
50.0000 ug | Freq: Once | INTRAMUSCULAR | Status: AC
  Administered 2015-07-17: 50 ug via INTRAVENOUS
  Filled 2015-07-17: qty 2

## 2015-07-17 MED ORDER — FENTANYL CITRATE (PF) 100 MCG/2ML IJ SOLN
INTRAMUSCULAR | Status: DC | PRN
  Administered 2015-07-17: 25 ug via INTRAVENOUS
  Administered 2015-07-17: 50 ug via INTRAVENOUS
  Administered 2015-07-17: 25 ug via INTRAVENOUS

## 2015-07-17 MED ORDER — CHLORHEXIDINE GLUCONATE 0.12% ORAL RINSE (MEDLINE KIT)
15.0000 mL | Freq: Two times a day (BID) | OROMUCOSAL | Status: DC
  Filled 2015-07-17 (×2): qty 15

## 2015-07-17 MED ORDER — HYDROCORTISONE NA SUCCINATE PF 100 MG IJ SOLR
50.0000 mg | Freq: Four times a day (QID) | INTRAMUSCULAR | Status: DC
  Administered 2015-07-17 – 2015-07-18 (×2): 50 mg via INTRAVENOUS
  Filled 2015-07-17 (×2): qty 2

## 2015-07-17 MED ORDER — SODIUM CHLORIDE 0.9 % IJ SOLN
INTRAMUSCULAR | Status: AC
  Filled 2015-07-17: qty 50

## 2015-07-17 MED ORDER — CHLORHEXIDINE GLUCONATE CLOTH 2 % EX PADS: 6.0000 | MEDICATED_PAD | Freq: Every day | CUTANEOUS | Status: DC

## 2015-07-17 MED ORDER — NOREPINEPHRINE BITARTRATE 1 MG/ML IV SOLN
0.0000 ug/min | INTRAVENOUS | Status: DC
  Administered 2015-07-17: 30 ug/min via INTRAVENOUS
  Filled 2015-07-17: qty 16

## 2015-07-17 MED ORDER — IOHEXOL 240 MG/ML SOLN
25.0000 mL | INTRAMUSCULAR | Status: DC
  Administered 2015-07-17: 25 mL via ORAL

## 2015-07-17 MED ORDER — CHLORHEXIDINE GLUCONATE 0.12% ORAL RINSE (MEDLINE KIT): 15.0000 mL | Freq: Two times a day (BID) | OROMUCOSAL | Status: DC

## 2015-07-17 MED ORDER — PIPERACILLIN-TAZOBACTAM 3.375 G IVPB
3.3750 g | Freq: Three times a day (TID) | INTRAVENOUS | Status: DC
  Filled 2015-07-17 (×3): qty 50

## 2015-07-17 MED ORDER — PIPERACILLIN-TAZOBACTAM 3.375 G IVPB
3.3750 g | Freq: Once | INTRAVENOUS | Status: AC
  Administered 2015-07-17: 3.375 g via INTRAVENOUS
  Filled 2015-07-17: qty 50

## 2015-07-17 MED ORDER — SODIUM CHLORIDE 0.9 % IV BOLUS (SEPSIS)
500.0000 mL | Freq: Once | INTRAVENOUS | Status: AC
  Administered 2015-07-17: 500 mL via INTRAVENOUS

## 2015-07-17 MED ORDER — SODIUM CHLORIDE 0.9 % IV BOLUS (SEPSIS)
1000.0000 mL | Freq: Once | INTRAVENOUS | Status: AC
Start: 2015-07-17 — End: 2015-07-17
  Administered 2015-07-17: 500 mL via INTRAVENOUS

## 2015-07-17 MED ORDER — PIPERACILLIN-TAZOBACTAM 3.375 G IVPB 30 MIN
3.3750 g | Freq: Three times a day (TID) | INTRAVENOUS | Status: DC
  Administered 2015-07-17: 3.375 g via INTRAVENOUS
  Filled 2015-07-17 (×6): qty 50

## 2015-07-17 MED ORDER — NOREPINEPHRINE BITARTRATE 1 MG/ML IV SOLN
0.0000 ug/min | INTRAVENOUS | Status: DC
  Administered 2015-07-17: 8 ug/min via INTRAVENOUS
  Administered 2015-07-17: 3 ug/min via INTRAVENOUS
  Filled 2015-07-17 (×2): qty 4

## 2015-07-17 MED ORDER — FENTANYL 2500MCG IN NS 250ML (10MCG/ML) PREMIX INFUSION
25.0000 ug/h | INTRAVENOUS | Status: DC
  Administered 2015-07-17: 50 ug/h via INTRAVENOUS
  Filled 2015-07-17: qty 250

## 2015-07-17 MED ORDER — LACTATED RINGERS IV SOLN
INTRAVENOUS | Status: DC | PRN
  Administered 2015-07-17 (×2): via INTRAVENOUS

## 2015-07-17 MED ORDER — HYDROMORPHONE HCL 1 MG/ML IJ SOLN: 1.0000 mg | INTRAMUSCULAR | Status: DC | PRN

## 2015-07-17 MED ORDER — SODIUM CHLORIDE 0.9 % IV SOLN: 250.0000 mL | INTRAVENOUS | Status: DC | PRN

## 2015-07-17 MED ORDER — MUPIROCIN 2 % EX OINT
1.0000 "application " | TOPICAL_OINTMENT | Freq: Two times a day (BID) | CUTANEOUS | Status: DC
  Administered 2015-07-17: 1 via NASAL
  Filled 2015-07-17: qty 22

## 2015-07-17 MED ORDER — TIOTROPIUM BROMIDE MONOHYDRATE 18 MCG IN CAPS
18.0000 ug | ORAL_CAPSULE | Freq: Every day | RESPIRATORY_TRACT | Status: DC
  Filled 2015-07-17: qty 5

## 2015-07-17 MED ORDER — EPHEDRINE SULFATE 50 MG/ML IJ SOLN
INTRAMUSCULAR | Status: DC | PRN
  Administered 2015-07-17: 5 mg via INTRAVENOUS

## 2015-07-17 MED ORDER — MIDAZOLAM HCL 2 MG/2ML IJ SOLN
INTRAMUSCULAR | Status: DC | PRN
  Administered 2015-07-17: 2 mg via INTRAVENOUS

## 2015-07-17 MED ORDER — SODIUM CHLORIDE 0.9 % IJ SOLN
3.0000 mL | Freq: Two times a day (BID) | INTRAMUSCULAR | Status: DC
Start: 2015-07-17 — End: 2015-07-18
  Administered 2015-07-17: 3 mL via INTRAVENOUS

## 2015-07-17 MED ORDER — IPRATROPIUM-ALBUTEROL 0.5-2.5 (3) MG/3ML IN SOLN: 3.0000 mL | RESPIRATORY_TRACT | Status: DC

## 2015-07-17 MED ORDER — KETAMINE HCL 50 MG/ML IJ SOLN
INTRAMUSCULAR | Status: DC | PRN
  Administered 2015-07-17: 10 mg via INTRAVENOUS
  Administered 2015-07-17 (×2): 20 mg via INTRAMUSCULAR

## 2015-07-17 MED ORDER — FAMOTIDINE IN NACL 20-0.9 MG/50ML-% IV SOLN
20.0000 mg | INTRAVENOUS | Status: DC
  Administered 2015-07-17: 20 mg via INTRAVENOUS
  Filled 2015-07-17: qty 50

## 2015-07-17 MED ORDER — PIPERACILLIN-TAZOBACTAM 3.375 G IVPB 30 MIN
3.3750 g | Freq: Three times a day (TID) | INTRAVENOUS | Status: DC
  Filled 2015-07-17 (×4): qty 50

## 2015-07-17 MED ORDER — DEXTROSE 50 % IV SOLN
50.0000 mL | INTRAVENOUS | Status: AC
  Administered 2015-07-17: 50 mL via INTRAVENOUS

## 2015-07-17 MED ORDER — NOREPINEPHRINE BITARTRATE 1 MG/ML IV SOLN
0.0000 ug/min | INTRAVENOUS | Status: DC
  Administered 2015-07-17: 20 ug/min via INTRAVENOUS

## 2015-07-17 MED ORDER — DIPHENHYDRAMINE HCL 12.5 MG/5ML PO ELIX: 12.5000 mg | ORAL_SOLUTION | Freq: Four times a day (QID) | ORAL | Status: DC | PRN

## 2015-07-17 SURGICAL SUPPLY — 36 items
CANISTER SUCT 1200ML W/VALVE (MISCELLANEOUS) ×3 IMPLANT
CATH TRAY 16F METER LATEX (MISCELLANEOUS) IMPLANT
CHLORAPREP W/TINT 26ML (MISCELLANEOUS) ×3 IMPLANT
DRAIN CHANNEL JP 19F (MISCELLANEOUS) ×3 IMPLANT
DRAPE LAPAROTOMY 100X77 ABD (DRAPES) ×3 IMPLANT
DRSG OPSITE POSTOP 4X8 (GAUZE/BANDAGES/DRESSINGS) ×3 IMPLANT
ELECT CAUTERY BLADE 6.4 (BLADE) ×3 IMPLANT
GAUZE SPONGE 4X4 12PLY STRL (GAUZE/BANDAGES/DRESSINGS) ×6 IMPLANT
GLOVE BIO SURGEON STRL SZ7.5 (GLOVE) ×6 IMPLANT
GLOVE INDICATOR 8.0 STRL GRN (GLOVE) ×3 IMPLANT
GOWN STRL REUS W/ TWL LRG LVL3 (GOWN DISPOSABLE) ×2 IMPLANT
GOWN STRL REUS W/TWL LRG LVL3 (GOWN DISPOSABLE) ×4
KIT CATH CVC 3 LUMEN 7FR 8IN (MISCELLANEOUS) ×6 IMPLANT
KIT RM TURNOVER STRD PROC AR (KITS) ×3 IMPLANT
LABEL OR SOLS (LABEL) ×3 IMPLANT
LIGASURE IMPACT 36 18CM CVD LR (INSTRUMENTS) ×3 IMPLANT
MULTI LUMEN CENTRAL VENOUS CATHETERIZATION KIT ×6 IMPLANT
NS IRRIG 1000ML POUR BTL (IV SOLUTION) ×3 IMPLANT
PACK BASIN MAJOR ARMC (MISCELLANEOUS) ×3 IMPLANT
PAD GROUND ADULT SPLIT (MISCELLANEOUS) ×3 IMPLANT
RELOAD PROXIMATE 75MM BLUE (ENDOMECHANICALS) ×3 IMPLANT
SHEARS HARMONIC STRL 23CM (MISCELLANEOUS) IMPLANT
SPONGE DRAIN TRACH 4X4 STRL 2S (GAUZE/BANDAGES/DRESSINGS) ×6 IMPLANT
STAPLER PROXIMATE 75MM BLUE (STAPLE) ×3 IMPLANT
STAPLER SKIN PROX 35W (STAPLE) ×3 IMPLANT
SUT ETHILON 3-0 KS 30 BLK (SUTURE) ×6 IMPLANT
SUT PDS AB 1 TP1 96 (SUTURE) ×3 IMPLANT
SUT SILK 3 0 (SUTURE)
SUT SILK 3-0 (SUTURE)
SUT SILK 3-0 18XBRD TIE 12 (SUTURE) IMPLANT
SUT SILK 3-0 SH-1 18XCR BRD (SUTURE)
SUT VIC AB 3-0 SH 27 (SUTURE)
SUT VIC AB 3-0 SH 27X BRD (SUTURE) IMPLANT
SUT VICRYL 3-0 CR8 SH (SUTURE) ×6 IMPLANT
SUTURE SILK 3-0 SH-1 18XCR BRD (SUTURE) IMPLANT
SYR BULB IRRIG 60ML STRL (SYRINGE) ×6 IMPLANT

## 2015-07-17 NOTE — Progress Notes (Signed)
ANTIBIOTIC CONSULT NOTE - INITIAL  Pharmacy Consult for piperacillin/tazobactam Indication: Intra-abdominal infection  Allergies  Allergen Reactions  . Heparin Other (See Comments)    Unknown reaction   . Nsaids Nausea And Vomiting and Other (See Comments)    Reaction:  GI distress and burning     Patient Measurements: Height: 5\' 3"  (160 cm) Weight: 112 lb 7 oz (51 kg) IBW/kg (Calculated) : 52.4  Vital Signs: Temp: 96.8 F (36 C) (12/18 1830) Temp Source: Core (Comment) (12/18 0830) BP: 154/75 mmHg (12/18 1830) Pulse Rate: 45 (12/18 1315) Intake/Output from previous day:   Intake/Output from this shift:    Labs:  Recent Labs  07/02/2015 0759 07/29/2015 1829  WBC 20.4*  --   HGB 11.8*  --   PLT 223  --   CREATININE 1.94* 1.73*   Estimated Creatinine Clearance: 25.1 mL/min (by C-G formula based on Cr of 1.73). No results for input(s): VANCOTROUGH, VANCOPEAK, VANCORANDOM, GENTTROUGH, GENTPEAK, GENTRANDOM, TOBRATROUGH, TOBRAPEAK, TOBRARND, AMIKACINPEAK, AMIKACINTROU, AMIKACIN in the last 72 hours.   Microbiology: Recent Results (from the past 720 hour(s))  C difficile quick scan w PCR reflex     Status: None   Collection Time: 06/20/15  4:30 AM  Result Value Ref Range Status   C Diff antigen NEGATIVE NEGATIVE Final   C Diff toxin NEGATIVE NEGATIVE Final   C Diff interpretation Negative for C. difficile  Final  C difficile quick scan w PCR reflex     Status: None   Collection Time: 06/29/15 10:27 AM  Result Value Ref Range Status   C Diff antigen NEGATIVE NEGATIVE Final   C Diff toxin NEGATIVE NEGATIVE Final   C Diff interpretation VALID  Final  MRSA PCR Screening     Status: Abnormal   Collection Time: 07/28/2015  5:26 PM  Result Value Ref Range Status   MRSA by PCR POSITIVE (A) NEGATIVE Final    Comment:        The GeneXpert MRSA Assay (FDA approved for NASAL specimens only), is one component of a comprehensive MRSA colonization surveillance program. It is  not intended to diagnose MRSA infection nor to guide or monitor treatment for MRSA infections. CRITICAL RESULT CALLED TO, READ BACK BY AND VERIFIED WITH: ADAM SCARBORO @ 1917 07/10/2015 BY [facility]     Assessment: Pharmacy consulted to dose piperacillin/tazobactam in this 69 year old female admitted today and underwent emergent surgery requiring total colectomy. Patient prescribed piperacillin/tazobactam for an intra-abdominal infection.  CrCl ~ 25 mL/min  Patient started on piperacillin/tazobactam 3.375 g IV q8h EI in the ED.  Plan:  Continue piperacillin/tazobactam 3.375 g IV q 8 hours EI per renal function and indication  Lenis Noon 07/29/2015,7:39 PM

## 2015-07-17 NOTE — ED Notes (Signed)
Report received - surgeon spoke with family who were at bedside. They are discussing options and deciding whether to try surgery or to make pt comfort care.

## 2015-07-17 NOTE — Progress Notes (Signed)
Pt arrived from OR postsurgery intubated FiO2 at 40% ABG obtained PO2 within normal limits however pt acidotic administered 1 amp of sodium bicarb per Dr. Stevenson Clinch repeat ABG at 21:00; per Dr. Stevenson Clinch if Levophed drip dose greater than 30 mcg/min orders may initiate vasopressin at 0.03 units; blood sugar on arrival 37 administered Dextrose repeat BMP obtained to reassess serum glucose; placed orders for chest xray and abdominal xray per Dr. Stevenson Clinch okay to use central line and NG tube; pt having multifocal PVC's and depressed T wave Dr. Stevenson Clinch aware placed orders to check magnesium and phosphorous levels may replace electrolytes per pharmacy; pts daughter updated about plan of care and questions answered

## 2015-07-17 NOTE — Progress Notes (Signed)
Update: Patient out of the OR now, into ICU 09. Findings from Surgery - entire left colon ischemia, prior right hemicolectomy.  Small bowel leading up to the left colon is noted to be viable.  Procedure - complete total colectomy,  Lines -RIGHT IJ -LEFT femoral arterial line  Patient currently intubated and sedated, on levophed 63mcg.   Plan - PAD - stop decadron, start solucortef (has hx of adrenal insufficiency) -maintain map >65 -cont zosyn -follow up surgery recs   Critical Care time - 40mins  Vilinda Boehringer, MD [facility] Pager [phone] (please enter 7-digits) On Call Pager - [phone] (please enter 7-digits)

## 2015-07-17 NOTE — Op Note (Signed)
Pre-operative Diagnosis:  Post-operative Diagnosis:   Surgeon: Clayburn Pert   Assistants: None  Anesthesia: General endotracheal anesthesia  ASA Class: 4  Surgeon: Clayburn Pert, MD FACS  Anesthesia: Gen. with endotracheal tube  Assistant:none  Procedure Details  The patient was seen again in the Holding Room. The benefits, complications, treatment options, and expected outcomes were discussed with the patient. The risks of bleeding, infection, recurrence of symptoms, failure to resolve symptoms,  bowel injury, any of which could require further surgery were reviewed with the patient.   The patient was taken to Operating Room, identified as Lynn Butler and the procedure verified.  A Time Out was held and the above information confirmed.  Prior to induction of general anesthesia, a left femoral arterial line was placed using a standard Seldinger technique. This provided a good waveform and correlated with her cuff pressures.  Prior to the induction of general anesthesia, antibiotic prophylaxis was administered. VTE prophylaxis was in place. General endotracheal anesthesia was then administered and tolerated well. After the induction, a right internal jugular triple-lumen central line was placed using ultrasound guidance and a standard Seldinger technique after this the abdomen was prepped with Chloraprep and draped in the sterile fashion. The patient was positioned in the supine position.  A midline incision was made with a 10 blade scalpel and using Bovie electrocautery and blunt dissection was taken down to the level of the fascia. Prior to identification of the entire fascia inadvertent injury was made with electrocautery with the ex spelling of large volumes of free intraperitoneal gas. The immediate smell of stool was encountered. The fascia was then opened up using electrocautery along its entire length of the incision. Immediately very dilated bowel was identified coming  from the low midline with multiple areas of necrosis and perforation. Liquid stool was freely pouring out of these and into the abdomen. These were controlled with bowel clamps. The entire abdomen was then explored and all purulent fluid was removed via the suction. The bowel was run from the ligament of Treitz to the rectum. The area of perforation was noted to be in the sigmoid colon. Upon further investigation the entire left colon was noted to be purple and nonviable. The patient had had a prior right hemicolectomy for ischemic colitis making this now a new left colon ischemic colitis. The small bowel leading up to the left colon is noted to be viable.  At this point the decision was made to perform a completion total colectomy. The left colon was mobilized along the ligament of Treitz using electrocautery. The distal colon and proximal small bowel were excised using 75 mm blue load standard GIA staplers. Using accommodation of blunt dissection, electrocautery, LigaSure bipolar the entire left colon was freed from its attachment to the lateral wall and the splenic flexure. The splenic flexure was noted to be scarred to the area lateral wall secondary to what appears to be chronic inflammation. The spleen was carefully protected and there is no evidence of bleeding as it was freed. The entire left colon was passed up the field as a single specimen. The remaining end ileostomy was noted to be viable. An ileostomy site was chosen in the right lower quadrant and created with a Babcock clamp and Bovie much cautery. This was dilated until it easily accepted the terminal ileum.  At this point the entire abdomen was copiously irrigated with 3 L of warm normal saline irrigation. Irrigation returned clear at the end of this. 219 [place] round  Blake drains were then placed in the abdomen one and each lower quadrant into the right and left paracolic gutter areas. Next the midline was closed with a running looped #1 PDS.  The skin was closed with surgical staples. The ileostomy was then matured with multiple 3-0 Vicryl sutures. The drains were then sutured in place with 3-0 nylon sutures. A sterile dressing was then placed over the midline using a honeycomb dressing. Drain sponges were placed around the drains and an ostomy appliance was applied over the ileostomy.  All counts were correct at the end of the procedure and there are no noted immediate, complications. The patient was then transferred to the ICU intubated, sedated and in critical condition.   Findings: Left-sided ischemic colitis   Estimated Blood Loss: 39ml         Drains: 19 [place] round Blake drains into bilateral lateral gutters         Specimens: Left colon          Complications: None                  Condition: Intubated and sedated in critical condition   Clayburn Pert, MD, FACS

## 2015-07-17 NOTE — Progress Notes (Signed)
Electrolyte management CONSULT NOTE - INITIAL   Pharmacy Consult for electrolyte management Indication: hypokalemia/hypomagnesemia  Allergies  Allergen Reactions  . Heparin Other (See Comments)    Unknown reaction   . Nsaids Nausea And Vomiting and Other (See Comments)    Reaction:  GI distress and burning    Patient Measurements: Height: 5\' 3"  (160 cm) Weight: 112 lb 7 oz (51 kg) IBW/kg (Calculated) : 52.4  Vital Signs: Temp: 97.5 F (36.4 C) (12/18 1900) BP: 107/62 mmHg (12/18 1900) Pulse Rate: 45 (12/18 1315) Intake/Output from previous day:   Intake/Output from this shift:    Labs:  Recent Labs  07/01/2015 0759 07/12/2015 1829  WBC 20.4*  --   HGB 11.8*  --   HCT 37.6  --   PLT 223  --   CREATININE 1.94* 1.73*  MG  --  1.2*  PHOS  --  7.6*  ALBUMIN 1.7*  --   PROT 4.8*  --   AST 22  --   ALT 11*  --   ALKPHOS 69  --   BILITOT 0.8  --    Estimated Creatinine Clearance: 25.1 mL/min (by C-G formula based on Cr of 1.73).  Assessment: Pharmacy consulted to manage electrolytes in this 69 year old female who was admitted today and underwent emergent surgery requiring total colectomy.   Labs tonight: K of 3.5 is within normal limits Phos is elevated at 7.6 Magnesium is low at 1.2  Goal of Therapy:  Electrolytes within normal limits: K 3.5 - 5.1 Mg 1.7 - 2.4 Phos 2.5 - 4.6  Plan:  Will give magnesium 4 g IV dose x1  No replacement needed for potassium at this time  Mg/K/Phos ordered with AM labs tomorrow. Pharmacy will continue to monitor, thank you for the consult.  Darylene Price Lilyahna Sirmon 07/04/2015,8:58 PM

## 2015-07-17 NOTE — Anesthesia Procedure Notes (Addendum)
Procedure Name: Intubation Performed by: Rolla Plate Pre-anesthesia Checklist: Patient identified, Patient being monitored, Timeout performed, Emergency Drugs available and Suction available Patient Re-evaluated:Patient Re-evaluated prior to inductionOxygen Delivery Method: Circle system utilized Preoxygenation: Pre-oxygenation with 100% oxygen Intubation Type: IV induction, Rapid sequence and Cricoid Pressure applied Laryngoscope Size: Miller and 2 Grade View: Grade I Tube type: Oral Tube size: 7.0 mm Number of attempts: 1 Placement Confirmation: ETT inserted through vocal cords under direct vision,  positive ETCO2 and breath sounds checked- equal and bilateral Secured at: 20 cm Tube secured with: Tape Dental Injury: Teeth and Oropharynx as per pre-operative assessment        n n

## 2015-07-17 NOTE — Progress Notes (Signed)
[facility] Progress Note Patient Name: Lynn Butler DOB: 1946/05/26 MRN: [id number]   Date of Service  07/09/2015  HPI/Events of Note  Called by nurse with ABG result. 7.12/49/120/15.  Patient on levophed with increasing vasopressor requirement.    eICU Interventions  Fluid bolus Increase RR to 22 Change LR to bicarb drip Check lactate Repeat abg 2330     Intervention Category Major Interventions: Acid-Base disturbance - evaluation and management  Mauri Brooklyn, P 07/20/2015, 8:34 PM

## 2015-07-17 NOTE — Transfer of Care (Signed)
Immediate Anesthesia Transfer of Care Note  Patient: Lynn Butler  Procedure(s) Performed: Procedure(s): EXPLORATORY LAPAROTOMY (N/A)  Patient Location: ICU  Anesthesia Type:General  Level of Consciousness: Patient remains intubated per anesthesia plan  Airway & Oxygen Therapy: Patient remains intubated per anesthesia plan  Post-op Assessment: Report given to RN and Post -op Vital signs reviewed and stable  Post vital signs: Reviewed  Last Vitals:  Filed Vitals:   07/11/2015 1315 07/25/2015 1657  BP: 92/64   Pulse: 45   Temp: 37.7 C   Resp: 33 14    Complications: No apparent anesthesia complications

## 2015-07-17 NOTE — ED Notes (Signed)
Dr Thomasene Lot notified of lactate 3.4

## 2015-07-17 NOTE — Consult Note (Signed)
PULMONARY / CRITICAL CARE MEDICINE   Name: Lynn Butler MRN: [id number] DOB: [birth year]    ADMISSION DATE:  07/07/2015 CONSULTATION DATE:  06/30/2015  REFERRING MD : Dr. Bolivar Haw   CHIEF COMPLAINT:     Abdominal pain, Perforated Viscus REASON FOR CONSULT - Post op Ventilator Management   HISTORY OF PRESENT ILLNESS    69 year old female past medical history of sepsis, recent admission for pneumonia requiring intubation, coronary artery disease, ischemic cardiomyopathy with ejection fraction of 10%, peripheral vascular disease, peripheral arterial disease, chronic back pain, history of MI, hypertension, COPD, presents to the emergency room with complaint of severe abdominal pain, localized to the epigastric area. Subsequent CT scan abdomen and pelvis showed perforated viscus, her symptoms were also nausea and vomiting. She has been seen and evaluated by surgery. Patient has previous abdominal surgical history and high comorbidities with a high risk of death following further surgical procedure, however she is mentating appropriately and able to make her own decisions at this time, and has requested to proceed with surgery. Her daughter is at the bedside and attests to patient wishes. Patient and daughter stated that if the patient acutely decompensates during surgery done medical staff is to continue with DO NOT RESUSCITATE wishes and make her comfort care. Patient and family stated that postoperatively she is to remain a DO NOT RESUSCITATE, and no further level of escalation of care if her clinical status is to worsen, if this occurs she is to then be made comfort care. This was witnessed by ER nursing staff and admitting hospitalist.    SIGNIFICANT EVENTS   07/05/15-mission for pneumonia, respiratory failure, requiring intubation. Hospitalization further complicated by respiratory failure, sepsis, acute renal failure requiring CRRT. Patient stabilized and was discharged after meeting  with palliative care with an order for DO NOT RESUSCITATE.   PAST MEDICAL HISTORY    :  Past Medical History  Diagnosis Date  . COPD (chronic obstructive pulmonary disease) ([place])   . Hypertension   . Ischemic cardiomyopathy     a.   . Coronary artery disease     a. s/p multiple stenting in 2005  . History of ventricular tachycardia   . PVD (peripheral vascular disease) ([place])   . PAD (peripheral artery disease) ([place])   . HLD (hyperlipidemia)   . Chronic back pain   . MI, old   . MRSA (methicillin resistant staph aureus) culture positive     left foot wound  . CHF (congestive heart failure) ([place])   . Pneumonia   . Asthma   . Cancer [facility])    Past Surgical History  Procedure Laterality Date  . Insert / replace / remove pacemaker    . Vascular bypass surgery Bilateral   . Cardiac catheterization N/A 01/14/2015    Procedure: Left Heart Cath;  Surgeon: Wellington Hampshire, MD;  Location: [facility];  Service: Cardiovascular;  Laterality: N/A;  . Cardiac defibrillator placement    . Appendectomy    . Cesarean section    . Abdominal surgery     Prior to Admission medications   Medication Sig Start Date End Date Taking? Authorizing Provider  acetaminophen (TYLENOL) 325 MG tablet Take 2 tablets (650 mg total) by mouth every 6 (six) hours as needed for mild pain (or Fever >/= 101). 07/05/15  Yes Dustin Flock, MD  albuterol (PROVENTIL HFA;VENTOLIN HFA) 108 (90 BASE) MCG/ACT inhaler Inhale 2 puffs into the lungs every 4 (four) hours as needed for wheezing or shortness of  breath.   Yes Historical Provider, MD  aspirin 81 MG chewable tablet Chew 1 tablet (81 mg total) by mouth daily. 07/05/15  Yes Dustin Flock, MD  atorvastatin (LIPITOR) 40 MG tablet Take 1 tablet (40 mg total) by mouth daily at 6 PM. 01/17/15  Yes Gladstone Lighter, MD  benzonatate (TESSALON) 100 MG capsule Take 200 mg by mouth 3 (three) times daily as needed for cough.   Yes Historical Provider, MD  carvedilol  (COREG) 3.125 MG tablet Take 1 tablet (3.125 mg total) by mouth 2 (two) times daily with a meal. 01/17/15  Yes Gladstone Lighter, MD  diazepam (VALIUM) 10 MG tablet Take 1 tablet (10 mg total) by mouth every 12 (twelve) hours as needed for anxiety. 07/05/15  Yes Dustin Flock, MD  diltiazem (CARDIZEM) 30 MG tablet Take 1 tablet (30 mg total) by mouth every 6 (six) hours. 07/05/15  Yes Dustin Flock, MD  diphenoxylate-atropine (LOMOTIL) 2.5-0.025 MG tablet Take 2 tablets by mouth 4 (four) times daily as needed for diarrhea or loose stools. 07/05/15  Yes Dustin Flock, MD  guaiFENesin (MUCINEX) 600 MG 12 hr tablet Take 1 tablet (600 mg total) by mouth 2 (two) times daily. 02/11/15  Yes Vaughan Basta, MD  hydrocortisone (CORTEF) 20 MG tablet Take 1 tablet (20 mg total) by mouth daily. 07/05/15  Yes Dustin Flock, MD  HYDROmorphone (DILAUDID) 4 MG tablet Take 1 tablet (4 mg total) by mouth every 6 (six) hours as needed for severe pain. 07/05/15  Yes Dustin Flock, MD  ipratropium-albuterol (DUONEB) 0.5-2.5 (3) MG/3ML SOLN Take 3 mLs by nebulization every 6 (six) hours as needed. Patient taking differently: Take 3 mLs by nebulization every 6 (six) hours as needed (for wheezing/shortness of breath).  02/11/15  Yes Vaughan Basta, MD  isosorbide mononitrate (IMDUR) 30 MG 24 hr tablet Take 1 tablet (30 mg total) by mouth daily. 02/11/15  Yes Vaughan Basta, MD  mometasone-formoterol (DULERA) 100-5 MCG/ACT AERO Inhale 2 puffs into the lungs 2 (two) times daily. 04/11/15  Yes Henreitta Leber, MD  oxyCODONE (OXYCONTIN) 80 mg 12 hr tablet Take 1 tablet (80 mg total) by mouth every 12 (twelve) hours. 07/05/15  Yes Dustin Flock, MD  pantoprazole (PROTONIX) 40 MG tablet Take 1 tablet (40 mg total) by mouth 2 (two) times daily. 07/05/15  Yes Dustin Flock, MD  potassium chloride SA (K-DUR,KLOR-CON) 20 MEQ tablet Take 20 mEq by mouth daily.   Yes Historical Provider, MD  promethazine (PHENERGAN) 25 MG  tablet Take 25 mg by mouth daily as needed for nausea or vomiting.   Yes Historical Provider, MD  tiotropium (SPIRIVA) 18 MCG inhalation capsule Place 1 capsule (18 mcg total) into inhaler and inhale daily. 04/11/15  Yes Henreitta Leber, MD  zolpidem (AMBIEN) 10 MG tablet Take 5 mg by mouth at bedtime as needed for sleep.   Yes Historical Provider, MD  benzonatate (TESSALON) 200 MG capsule Take 1 capsule (200 mg total) by mouth 3 (three) times daily as needed for cough. 02/11/15   Vaughan Basta, MD  budesonide (PULMICORT) 0.25 MG/2ML nebulizer solution Take 2 mLs (0.25 mg total) by nebulization 2 (two) times daily. 07/05/15   Dustin Flock, MD  feeding supplement, ENSURE ENLIVE, (ENSURE ENLIVE) LIQD Take 237 mLs by mouth 3 (three) times daily with meals. 02/11/15   Vaughan Basta, MD  furosemide (LASIX) 40 MG tablet Take 1 tablet (40 mg total) by mouth daily. 07/05/15   Dustin Flock, MD   Allergies  Allergen Reactions  .  Heparin Other (See Comments)    Unknown reaction   . Nsaids Nausea And Vomiting and Other (See Comments)    Reaction:  GI distress and burning      FAMILY HISTORY   Family History  Problem Relation Age of Onset  . CAD Other   . Breast cancer Mother   . Heart disease Mother   . Coronary artery disease Father       SOCIAL HISTORY    reports that she has been smoking Cigarettes.  She has been smoking about 0.50 packs per day. She has never used smokeless tobacco. She reports that she does not drink alcohol or use illicit drugs.  Review of Systems  Constitutional: Negative for fever, chills and malaise/fatigue.  HENT: Negative for ear pain, hearing loss and tinnitus.   Eyes: Negative for blurred vision and double vision.  Respiratory: Negative for cough, hemoptysis and sputum production.   Cardiovascular: Negative for chest pain and palpitations.  Gastrointestinal: Positive for nausea, vomiting and abdominal pain.  Genitourinary: Negative for  dysuria, urgency and frequency.  Musculoskeletal: Negative for myalgias.  Skin: Negative for itching and rash.       Large dry, scale like lesion on forehead - this is chronic  Neurological: Negative for headaches.  Endo/Heme/Allergies: Does not bruise/bleed easily.      VITAL SIGNS    Temp:  [99.5 F (37.5 C)-101.8 F (38.8 C)] 99.5 F (37.5 C) (12/18 1045) Pulse Rate:  [104-130] 105 (12/18 1045) Resp:  [22-41] 27 (12/18 1045) BP: (85-104)/(39-90) 90/48 mmHg (12/18 1045) SpO2:  [83 %-99 %] 94 % (12/18 1045) Weight:  [113 lb 9.6 oz (51.529 kg)] 113 lb 9.6 oz (51.529 kg) (12/18 0742) HEMODYNAMICS:   VENTILATOR SETTINGS:   INTAKE / OUTPUT:  Intake/Output Summary (Last 24 hours) at 07/13/2015 1154 Last data filed at 07/14/2015 [id number]  Gross per 24 hour  Intake      0 ml  Output     50 ml  Net    -50 ml       PHYSICAL EXAM   Physical Exam  Constitutional: She is oriented to person, place, and time.  Cachetic appearing, elderly female, sitting in bed, mentating well  HENT:  Head: Normocephalic and atraumatic.  Right Ear: External ear normal.  Left Ear: External ear normal.  Nose: Nose normal.  Mouth/Throat: Oropharynx is clear and moist.  Eyes: Conjunctivae and EOM are normal. Pupils are equal, round, and reactive to light.  Neck: Normal range of motion. Neck supple. No tracheal deviation present. No thyromegaly present.  Cardiovascular: Normal rate, regular rhythm, normal heart sounds and intact distal pulses.   Pulmonary/Chest: Effort normal and breath sounds normal. No respiratory distress. She has no wheezes. She has no rales. She exhibits no tenderness.  Abdominal: She exhibits distension. There is tenderness. There is guarding.  Musculoskeletal: Normal range of motion.  Neurological: She is alert and oriented to person, place, and time. No cranial nerve deficit.  Skin: Skin is warm.  Large dry, scaling, lesion on the forehead - chronic  Nursing note and vitals  reviewed.      LABS   LABS:  CBC  Recent Labs Lab 07/16/2015 0759  WBC 20.4*  HGB 11.8*  HCT 37.6  PLT 223   Coag's No results for input(s): APTT, INR in the last 168 hours. BMET  Recent Labs Lab 07/14/2015 0759  NA 141  K 3.0*  CL 113*  CO2 19*  BUN 32*  CREATININE 1.94*  GLUCOSE  91   Electrolytes  Recent Labs Lab 07/10/2015 0759  CALCIUM 7.2*   Sepsis Markers  Recent Labs Lab 07/30/2015 0759  LATICACIDVEN 3.4*   ABG No results for input(s): PHART, PCO2ART, PO2ART in the last 168 hours. Liver Enzymes  Recent Labs Lab 07/01/2015 0759  AST 22  ALT 11*  ALKPHOS 69  BILITOT 0.8  ALBUMIN 1.7*   Cardiac Enzymes No results for input(s): TROPONINI, PROBNP in the last 168 hours. Glucose No results for input(s): GLUCAP in the last 168 hours.   No results found for this or any previous visit (from the past 240 hour(s)).   Current facility-administered medications:  .  budesonide (PULMICORT) nebulizer solution 0.25 mg, 0.25 mg, Nebulization, BID, Dustin Flock, MD .  dexamethasone (DECADRON) injection 4 mg, 4 mg, Intravenous, 4 times per day, Dustin Flock, MD .  fentaNYL (SUBLIMAZE) injection 50 mcg, 50 mcg, Intravenous, Q1H PRN, Ahmed Prima, MD, 50 mcg at 07/12/2015 [phone] .  ipratropium-albuterol (DUONEB) 0.5-2.5 (3) MG/3ML nebulizer solution 3 mL, 3 mL, Nebulization, Q4H, Dustin Flock, MD .  piperacillin-tazobactam (ZOSYN) IVPB 3.375 g, 3.375 g, Intravenous, 3 times per day, Dustin Flock, MD .  tiotropium [facility]) inhalation capsule 18 mcg, 18 mcg, Inhalation, Daily, Dustin Flock, MD  Current outpatient prescriptions:  .  acetaminophen (TYLENOL) 325 MG tablet, Take 2 tablets (650 mg total) by mouth every 6 (six) hours as needed for mild pain (or Fever >/= 101)., Disp: 1 tablet, Rfl: 0 .  albuterol (PROVENTIL HFA;VENTOLIN HFA) 108 (90 BASE) MCG/ACT inhaler, Inhale 2 puffs into the lungs every 4 (four) hours as needed for wheezing or shortness  of breath., Disp: , Rfl:  .  aspirin 81 MG chewable tablet, Chew 1 tablet (81 mg total) by mouth daily., Disp: , Rfl:  .  atorvastatin (LIPITOR) 40 MG tablet, Take 1 tablet (40 mg total) by mouth daily at 6 PM., Disp: 30 tablet, Rfl: 0 .  benzonatate (TESSALON) 100 MG capsule, Take 200 mg by mouth 3 (three) times daily as needed for cough., Disp: , Rfl:  .  carvedilol (COREG) 3.125 MG tablet, Take 1 tablet (3.125 mg total) by mouth 2 (two) times daily with a meal., Disp: 60 tablet, Rfl: 2 .  diazepam (VALIUM) 10 MG tablet, Take 1 tablet (10 mg total) by mouth every 12 (twelve) hours as needed for anxiety., Disp: 30 tablet, Rfl: 0 .  diltiazem (CARDIZEM) 30 MG tablet, Take 1 tablet (30 mg total) by mouth every 6 (six) hours., Disp: 1 tablet, Rfl: 0 .  diphenoxylate-atropine (LOMOTIL) 2.5-0.025 MG tablet, Take 2 tablets by mouth 4 (four) times daily as needed for diarrhea or loose stools., Disp: 30 tablet, Rfl: 0 .  guaiFENesin (MUCINEX) 600 MG 12 hr tablet, Take 1 tablet (600 mg total) by mouth 2 (two) times daily., Disp: 20 tablet, Rfl: 0 .  hydrocortisone (CORTEF) 20 MG tablet, Take 1 tablet (20 mg total) by mouth daily., Disp: 1 tablet, Rfl: 0 .  HYDROmorphone (DILAUDID) 4 MG tablet, Take 1 tablet (4 mg total) by mouth every 6 (six) hours as needed for severe pain., Disp: 15 tablet, Rfl: 0 .  ipratropium-albuterol (DUONEB) 0.5-2.5 (3) MG/3ML SOLN, Take 3 mLs by nebulization every 6 (six) hours as needed. (Patient taking differently: Take 3 mLs by nebulization every 6 (six) hours as needed (for wheezing/shortness of breath). ), Disp: 360 mL, Rfl: 0 .  isosorbide mononitrate (IMDUR) 30 MG 24 hr tablet, Take 1 tablet (30 mg total) by mouth daily.,  Disp: 30 tablet, Rfl: 0 .  mometasone-formoterol (DULERA) 100-5 MCG/ACT AERO, Inhale 2 puffs into the lungs 2 (two) times daily., Disp: , Rfl:  .  oxyCODONE (OXYCONTIN) 80 mg 12 hr tablet, Take 1 tablet (80 mg total) by mouth every 12 (twelve) hours., Disp: 14  tablet, Rfl: 0 .  pantoprazole (PROTONIX) 40 MG tablet, Take 1 tablet (40 mg total) by mouth 2 (two) times daily., Disp: 1 tablet, Rfl: 0 .  potassium chloride SA (K-DUR,KLOR-CON) 20 MEQ tablet, Take 20 mEq by mouth daily., Disp: , Rfl:  .  promethazine (PHENERGAN) 25 MG tablet, Take 25 mg by mouth daily as needed for nausea or vomiting., Disp: , Rfl:  .  tiotropium (SPIRIVA) 18 MCG inhalation capsule, Place 1 capsule (18 mcg total) into inhaler and inhale daily., Disp: 30 capsule, Rfl: 12 .  zolpidem (AMBIEN) 10 MG tablet, Take 5 mg by mouth at bedtime as needed for sleep., Disp: , Rfl:  .  benzonatate (TESSALON) 200 MG capsule, Take 1 capsule (200 mg total) by mouth 3 (three) times daily as needed for cough., Disp: 20 capsule, Rfl: 0 .  budesonide (PULMICORT) 0.25 MG/2ML nebulizer solution, Take 2 mLs (0.25 mg total) by nebulization 2 (two) times daily., Disp: 60 mL, Rfl: 12 .  feeding supplement, ENSURE ENLIVE, (ENSURE ENLIVE) LIQD, Take 237 mLs by mouth 3 (three) times daily with meals., Disp: 237 mL, Rfl: 12 .  furosemide (LASIX) 40 MG tablet, Take 1 tablet (40 mg total) by mouth daily., Disp: 30 tablet, Rfl: 0  IMAGING    Ct Abdomen Pelvis Wo Contrast  07/28/2015  CLINICAL DATA:  Abdominal pain and distension EXAM: CT ABDOMEN AND PELVIS WITHOUT CONTRAST TECHNIQUE: Multidetector CT imaging of the abdomen and pelvis was performed following the standard protocol without IV contrast. COMPARISON:  06/30/2015, 08/03/2014 FINDINGS: Lung lung bases demonstrate mild scarring in the right lower lobe as well as small pulmonary nodules measuring less than 4 mm. These are stable from previous exam dating back to January 2016. The liver, spleen, pancreas, adrenal glands and kidneys are stable in appearance from the prior exam. Diffuse renal vascular calcifications are seen. Small stones are also noted bilaterally. No obstructive changes are seen. A considerable amount of free air and free fluid is noted  within the abdominal cavity. The sigmoid is focally dilated with considerable bowel material within. The loop of dilated sigmoid is constricted centrally although no true volvulus is seen. This may represent internal hernia. This dilated loop is likely the etiology of the focal perforation. The colon proximal to this is within normal limits. There are some mildly dilated loops of small bowel identified also related to this area of focal constriction. The bladder is decompressed. 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 No acute bony abnormality is noted. IMPRESSION: Considerable free air and free fluid within the abdomen likely related to perforation of a focally dilated loop of sigmoid colon. There are changes suggestive of an internal hernia best seen on image number 46 of series 2. This also contributes to a degree of mild small bowel obstruction. Clinical correlation is recommended. Stable pulmonary nodules. Chronic changes similar to that seen on the prior exam. Critical Value/emergent results were called by telephone at the time of interpretation on 07/16/2015 at 9:49 am to Dr. Francene Castle , who verbally acknowledged these results. Electronically Signed   By: Inez Catalina M.D.   On: 07/17/2015 09:51   Dg Chest 1 View  07/13/2015  CLINICAL DATA:  Difficulty breathing EXAM: CHEST  1 VIEW COMPARISON:  06/28/2015 FINDINGS: Cardiac shadow remains mildly enlarged. A defibrillator is again seen and stable. Lungs are well aerated bilaterally. Mild interstitial changes are seen without focal infiltrate or sizable effusion. IMPRESSION: No acute abnormality noted. Electronically Signed   By: Inez Catalina M.D.   On: 06/30/2015 08:32      Indwelling Urinary Catheter continued, requirement due to   Reason to continue Indwelling Urinary Catheter for strict Intake/Output monitoring for hemodynamic instability   Central Line continued, requirement due to   Reason to  continue [employer] Monitoring of central venous pressure or other hemodynamic parameters   Ventilator continued, requirement due to, resp failure    Ventilator Sedation RASS 0 to -2   Cultures: BCx2  UC  Sputum  Antibiotics:  Lines:   ASSESSMENT/PLAN  69 year old female with multiple medical problems now with perforated abdomen, going to surgery, CCM services requested postop for ventilator management and medical management.  PULMONARY Impending respiratory failure requiring mechanical ventilation postoperatively Sepsis P:   -Continue with mechanical ventilation postoperatively -Continue oxygen saturations greater than 88% -Wean as tolerated -ABG when necessary -Sedation/analgesia protocol -Continue with close monitoring  CARDIOVASCULAR History of ischemic cardiomyopathy Low ejection fraction (less than 15%) Coronary artery disease Combined systolic and diastolic heart failure Hypotension P:  -Currently in no acute CHF exacerbation -Continue with close hemodynamic monitoring -Adjust pressors and/or CHF meds postoperatively  RENAL Acute renal failure P:   -Secondary to dehydration, perforated viscus -Continue gentle IV fluids -Monitor electrolytes  GASTROINTESTINAL Perforated viscus Abdominal pain P:   -Planning for surgical intervention, high risk of death for surgery given his significant comorbidities, family and patient wishes to continue with surgery. -Continue with her Biotics -Follow-up postoperatively  HEMATOLOGIC -Monitor CBC postoperatively  INFECTIOUS Sepsis Perforated viscus P:   -Continue with empiric antibiotics as stated above -Sepsis secondary to GI source, most likely -Follow-up on cultures  ENDOCRINE -SSI -Hypoglycemic/hyperglycemic ICU protocol -Questionable adrenal insufficiency secondary to being on chronic oral steroids. Hold Steroids for possible surgical procedure -May need to start on hydrocortisone postoperatively for  suspected adrenal insufficiency   SOCIAL -Patient has had multiple hospitalizations in the past, she has been seen and evaluated by palliative care physician. During her last hospitalization she made herself a DO NOT RESUSCITATE and this was witnessed by her daughter. -Patient with perforated viscus, requiring surgical intervention, has multiple comorbidities and high risk of death per surgical evaluation and medical evaluation -Patient wishes to continue with surgical intervention given her significant comorbidities, however she stated that postoperatively she is to remain DO NOT RESUSCITATE and also intraoperatively she is to remain DO NOT RESUSCITATE if her clinical status worsens.  I have personally obtained a history, examined the patient, evaluated laboratory and imaging results, formulated the assessment and plan and placed orders.  The Patient requires high complexity decision making for assessment and support, frequent evaluation and titration of therapies, application of advanced monitoring technologies and extensive interpretation of multiple databases. Critical Care Time devoted to patient care services described in this note is 45 minutes.   Overall, patient is critically ill, prognosis is guarded. Patient at high risk for cardiac arrest and death.   Vilinda Boehringer, MD [facility] Pager [phone] (please enter 7-digits) On Call Pager [phone] (please enter 7-digits)     07/16/2015, 11:54 AM  Note: This note was prepared with Dragon dictation along with smaller  Company secretary. Any transcriptional errors that result from this process are unintentional.

## 2015-07-17 NOTE — Progress Notes (Signed)
   07/19/2015 1800  Clinical Encounter Type  Visited With Patient  Visit Type Follow-up  Referral From Nurse  Consult/Referral To Chaplain  Spiritual Encounters  Spiritual Needs Prayer;Emotional  Provided pastoral presence, support and prayer to patient.  No family present during my visit.  [place] [phone]

## 2015-07-17 NOTE — Brief Op Note (Signed)
07/21/2015  4:49 PM  PATIENT:  Lynn Butler  69 y.o. female  PRE-OPERATIVE DIAGNOSIS:  abdominal free air  POST-OPERATIVE DIAGNOSIS:  Ischemic colitis of the left colon  PROCEDURE:  Procedure(s): EXPLORATORY LAPAROTOMY (N/A)  Left colectomy End ileostomy  SURGEON:  Surgeon(s) and Role:    * Clayburn Pert, MD - Primary  PHYSICIAN ASSISTANT:   ASSISTANTS: none   ANESTHESIA:   general  EBL:  Total I/O In: 4000 [I.V.:4000] Out: 150 [Urine:100; Blood:50]  BLOOD ADMINISTERED:none  DRAINS: (19fr) Jackson-Pratt drain(s) with closed bulb suction in the right and left paracolic gutters   LOCAL MEDICATIONS USED:  NONE  SPECIMEN:  Source of Specimen:  left colon  DISPOSITION OF SPECIMEN:  PATHOLOGY  COUNTS:  YES  TOURNIQUET:  * No tourniquets in log *  DICTATION: .Dragon Dictation  PLAN OF CARE: Admit to inpatient   PATIENT DISPOSITION:  ICU - intubated and critically ill.   Delay start of Pharmacological VTE agent (>24hrs) due to surgical blood loss or risk of bleeding: no

## 2015-07-17 NOTE — Progress Notes (Signed)
RT to ICU room 9 to receive patient from OR and place on servo vent.  Patient has 7.0 ETT at 22 at lip, RT secured ETT with ETAD without complication and placed patient on servo vent.

## 2015-07-17 NOTE — ED Notes (Signed)
Orderly arrived to take pt to or - this nurse went with to monitor

## 2015-07-17 NOTE — ED Notes (Signed)
Family wants pt to have a dnr in place after surgery per admitting dr.

## 2015-07-17 NOTE — Anesthesia Preprocedure Evaluation (Addendum)
Anesthesia Evaluation  Patient identified by MRN, date of birth, ID band Patient awake    Reviewed: Allergy & Precautions, NPO status , Patient's Chart, lab work & pertinent test results  Airway Mallampati: II  TM Distance: >3 FB Neck ROM: Full    Dental   Pulmonary shortness of breath, at rest and lying, asthma , pneumonia, COPD,  COPD inhaler, Current Smoker,  Respiratory failure   + rhonchi  + decreased breath sounds unstable     Cardiovascular hypertension, + CAD, + Past MI, + Peripheral Vascular Disease and +CHF   Rate:Tachycardia  EF 10-15%   Neuro/Psych With metabolic encephalopathy, I am not quite sure of patient's ability to reason, but she insists on trying surgery despite overwhelming odds.  Patient and family appear to understand the gravity of the situation.    GI/Hepatic Neg liver ROS, Acute abdomen with sepsis...abdominal pain   Endo/Other  negative endocrine ROS  Renal/GU ARFRenal disease     Musculoskeletal   Abdominal (+)  Abdomen: tender.    Peds negative pediatric ROS (+)  Hematology   Anesthesia Other Findings Multiorgan system dysfunction  Reproductive/Obstetrics                           Anesthesia Physical Anesthesia Plan  ASA: IV and emergent  Anesthesia Plan: General   Post-op Pain Management:    Induction: Intravenous, Rapid sequence and Cricoid pressure planned  Airway Management Planned: Oral ETT  Additional Equipment: Arterial line  Intra-op Plan:   Post-operative Plan: Possible Post-op intubation/ventilation  Informed Consent: I have reviewed the patients History and Physical, chart, labs and discussed the procedure including the risks, benefits and alternatives for the proposed anesthesia with the patient or authorized representative who has indicated his/her understanding and acceptance.   Dental advisory given  Plan Discussed with: CRNA and  Surgeon  Anesthesia Plan Comments: (Patient and family are aware that her condition is dire and that she could arrest in the OR.  Patient wishes to proceed.  )       Anesthesia Quick Evaluation

## 2015-07-17 NOTE — ED Provider Notes (Signed)
[facility] Emergency Department Provider Note  ____________________________________________  Time seen: 45  I have reviewed the triage vital signs and the nursing notes.  History by:  Report from EMS. Some input from patient.  HISTORY  Chief Complaint Shortness of breath Altered mental status Abdominal pain    HPI Lynn Butler is a 69 y.o. [facility]. [facility] has history of COPD, cardiac disease, and she has an implanted defibrillator. The staff at [facility] noted that she was having increased difficulty breathing and was a little more agitated and confused. 911 was called and she has been brought to the emergency department. On my initial interview, the patient is alert and communicative. She has some limitations with the communication and I'm told that she is usually more communicative and alert and oriented 3. She tells me she is having "stomach pain". This pain is "all over" and began this morning. She reports she felt fine yesterday. She is noted to be tachycardic and has tachypnea.    Past Medical History  Diagnosis Date  . COPD (chronic obstructive pulmonary disease) ([place])   . Hypertension   . Ischemic cardiomyopathy     a.   . Coronary artery disease     a. s/p multiple stenting in 2005  . History of ventricular tachycardia   . PVD (peripheral vascular disease) ([place])   . PAD (peripheral artery disease) ([place])   . HLD (hyperlipidemia)   . Chronic back pain   . MI, old   . MRSA (methicillin resistant staph aureus) culture positive     left foot wound  . CHF (congestive heart failure) ([place])   . Pneumonia   . Asthma   . Cancer [facility])     Patient Active Problem List   Diagnosis Date Noted  . Abdominal pain 07/23/2015  . Pressure ulcer 06/11/2015  . Multi-organ system dysfunction 06/10/2015  . Septic shock ([place]) 06/10/2015  . CAP (community acquired pneumonia) 06/10/2015  . Chest pain, central  05/21/2015  . Right lower lobe pneumonia 04/07/2015  . Acute on chronic respiratory failure with hypoxia ([place]) 04/07/2015  . Acute renal failure ([place]) 04/07/2015  . Leukocytosis 04/07/2015  . Metabolic encephalopathy [id number]  . HCAP (healthcare-associated pneumonia) 02/06/2015  . Sepsis ([place]) 02/06/2015  . Chronic respiratory failure with hypoxia ([place]) 02/01/2015  . Syncope and collapse 02/01/2015  . Chronic combined systolic and diastolic CHF (congestive heart failure) ([place]) 02/01/2015  . Myocardial infarction in recovery phase ([place]) 01/20/2015  . HTN (hypertension) 01/20/2015  . Acute MI ([place]) 01/13/2015  . Protein-calorie malnutrition, severe ([place]) 07/22/2014  . COPD mixed type ([place]) 07/21/2014  . CAD (coronary artery disease) 07/21/2014  . CHF (congestive heart failure) ([place]) 07/21/2014  . PVD (peripheral vascular disease) ([place]) 07/21/2014  . Leg ulcer ([place]) 07/21/2014  . Skin lesion of face 07/21/2014  . Adult failure to thrive 07/21/2014  . Claudication of both lower extremities ([place]) 07/21/2014  . Smoker 07/21/2014    Past Surgical History  Procedure Laterality Date  . Insert / replace / remove pacemaker    . Vascular bypass surgery Bilateral   . Cardiac catheterization N/A 01/14/2015    Procedure: Left Heart Cath;  Surgeon: Wellington Hampshire, MD;  Location: [facility];  Service: Cardiovascular;  Laterality: N/A;  . Cardiac defibrillator placement    . Appendectomy    . Cesarean section    . Abdominal surgery      No current outpatient prescriptions on file.  Allergies Heparin and Nsaids  Family History  Problem Relation Age of Onset  . CAD Other   . Breast cancer Mother   . Heart disease Mother   . Coronary artery disease Father     Social History Social History  Substance Use Topics  . Smoking status: Current Every Day Smoker -- 0.50 packs/day    Types: Cigarettes  . Smokeless tobacco: Never Used  . Alcohol Use: No    Review of  Systems  Constitutional: No report of fever, but she is noted to have one upon arrival in the emergency department. Cardiovascular: Negative for chest pain. Noted tachycardia Respiratory: History of COPD and noted current increase work of breathing. Gastrointestinal: Positive for abdominal pain per patient.. Genitourinary: Negative for dysuria. Skin: Negative for rash. Neurological: Patient with diminished communication and cognition currently.   10-point ROS otherwise negative.  ____________________________________________   PHYSICAL EXAM:  VITAL SIGNS: ED Triage Vitals  Enc Vitals Group     BP --      Pulse --      Resp --      Temp --      Temp src --      SpO2 07/22/2015 0732 96 %     Weight --      Height --      Head Cir --      [place] [place] --      Pain Score --      Pain Loc --      Pain Edu? --      Excl. in [place]? --     Constitutional:  Alert. Appears older than stated age. Appears uncomfortable, tachypnea, as noted tachycardia. ENT   Head: Normocephalic and atraumatic.   Nose: No congestion/rhinnorhea.       Mouth: No erythema, no swelling   Cardiovascular: Cardiac 120, regular rhythm, no murmur noted Respiratory: Tachypnea, coarse breath sounds bilaterally.  Gastrointestinal: Soft. Abdomen protruded some but there is no tightness to it. She is diffusely tender on exam. Musculoskeletal: No deformity noted. Nontender with normal range of motion in all extremities. Mild peripheral edema in both legs.  Neurologic:  Communicative, but question of cognition. Normal appearing spontaneous movement in all 4 extremities. No gross focal neurologic deficits are appreciated.  Skin:  Skin is warm, dry. No rash noted. There is a dressing applied to the left heel foot area. Psychiatric: Patient is conversive, and cooperative, but she appears uncomfortable, and her speech is not fluid..  ____________________________________________    LABS (pertinent  positives/negatives)  Labs Reviewed  LACTIC ACID, PLASMA - Abnormal; Notable for the following:    Lactic Acid, Venous 3.4 (*)    All other components within normal limits  COMPREHENSIVE METABOLIC PANEL - Abnormal; Notable for the following:    Potassium 3.0 (*)    Chloride 113 (*)    CO2 19 (*)    BUN 32 (*)    Creatinine, Ser 1.94 (*)    Calcium 7.2 (*)    Total Protein 4.8 (*)    Albumin 1.7 (*)    ALT 11 (*)    GFR calc non Af Amer 25 (*)    GFR calc Af Amer 29 (*)    All other components within normal limits  CBC WITH DIFFERENTIAL/PLATELET - Abnormal; Notable for the following:    WBC 20.4 (*)    Hemoglobin 11.8 (*)    MCH 25.3 (*)    MCHC 31.3 (*)    RDW 27.6 (*)  Neutro Abs 15.9 (*)    All other components within normal limits  URINALYSIS COMPLETEWITH MICROSCOPIC ([facility] ONLY) - Abnormal; Notable for the following:    Color, Urine YELLOW (*)    APPearance HAZY (*)    Protein, ur 30 (*)    Leukocytes, UA 2+ (*)    Bacteria, UA RARE (*)    Squamous Epithelial / LPF 6-30 (*)    All other components within normal limits  URINE CULTURE  CULTURE, BLOOD (ROUTINE X 2)  CULTURE, BLOOD (ROUTINE X 2)    ____________________________________________   EKG  ED ECG REPORT I, Rebekkah Powless W, the attending physician, personally viewed and interpreted this ECG.   Date: 07/08/2015  EKG Time: 740  Rate: 1:30  Rhythm: Sinus tachycardia Some erratic waveforms with artifact  Axis: Normal  Intervals: QTC of 564  ST&T Change: Computer interpretation of minimal ST elevation in 23 and aVF. I do not see this and I think that the artifact is leading to an inaccurate computer interpretation. T wave is down in lead 1 and appears relatively flat in leads 2 and 3, aVL, and aVF.   ____________________________________________    RADIOLOGY  Chest x-ray: FINDINGS: Cardiac shadow remains mildly enlarged. A defibrillator is again seen and stable. Lungs are well aerated  bilaterally. Mild interstitial changes are seen without focal infiltrate or sizable effusion.  IMPRESSION: No acute abnormality noted.   CT abdomen:  ____________________________________________   PROCEDURES  CRITICAL CARE Performed by: Ahmed Prima   Total critical care time: 45 minutes due to the advancing complicated nature of this patient's septic condition with a complex differential diagnosis. This time includes her evaluation of the patient, discussion with consultants, and ongoing care in the emergency department.  Critical care time was exclusive of separately billable procedures and treating other patients.  Critical care was necessary to treat or prevent imminent or life-threatening deterioration.  Critical care was time spent personally by me on the following activities: development of treatment plan with patient and/or surrogate as well as nursing, discussions with consultants, evaluation of patient's response to treatment, examination of patient, obtaining history from patient or surrogate, ordering and performing treatments and interventions, ordering and review of laboratory studies, ordering and review of radiographic studies, pulse oximetry and re-evaluation of patient's condition.    ____________________________________________   INITIAL IMPRESSION / ASSESSMENT AND PLAN / ED COURSE  Pertinent labs & imaging results that were available during my care of the patient were reviewed by me and considered in my medical decision making (see chart for details).  Older than stated age and somewhat ill-appearing 69 year old female. She appears weak. She has diffuse abdominal tenderness with mild distention. She is febrile and tachycardic and meets criteria for sepsis. Given this profile, we will obtain cultures, chest x-ray, remaining pertinent labs, and begin Zosyn.  There is a dressing on the left foot. Upon removal, she has a ulceration that appears to be healing  slowly. There is no pulse, either by palpation or Doppler. I have paged vascular surgery to discuss the patient's vascular issue. It is unclear if her sepsis is from the foot or the abdomen. CT scan is pending.  ----------------------------------------- 9:10 AM on 07/03/2015 -----------------------------------------  The patient just underwent CT scan without contrast. She had significant amount of free air in the abdominal cavity noted. Review of her lab tests shows white count 20,000. Her potassium is low at 3.0. Given the obvious finding on CT scan, I have called and spoken with general surgery  on-call, Dr. Adonis Huguenin, who agrees to see the patient in the emergency department.  ----------------------------------------- 11:40 PM on 07/29/2015 -----------------------------------------  I had multiple discussions with Dr. Adonis Huguenin about Miss Hirth. He was diligent and conscientious as he reviewed her history and spoke with her family to provide the best care for this critically ill woman. We also discussed the case with Dr. Posey Pronto of the hospitalist service.  Respecting the family's wishes and in recognition of the patient's poor prognosis, Dr. Adonis Huguenin agreed to take the patient to the operating room in an attempt to repair her perforated bowel.  ____________________________________________   FINAL CLINICAL IMPRESSION(S) / ED DIAGNOSES  Final diagnoses:  Combined abdominal and pelvic pain  sepsis due to perforated bowel perforated bowel Ulceration, left foot Vascular disease, left leg COPD     Ahmed Prima, MD 07/15/2015 1542

## 2015-07-17 NOTE — H&P (Addendum)
[facility] NAME: Lynn Butler    MR#:  [id number]  DATE OF BIRTH:  [birth year]  DATE OF ADMISSION:  07/28/2015  PRIMARY CARE PHYSICIAN: Juanell Fairly, MD   REQUESTING/REFERRING PHYSICIAN:   CHIEF COMPLAINT:   Chief Complaint  Patient presents with  . Shortness of Breath, abdominal pain     HISTORY OF PRESENT ILLNESS: Lynn Butler  is a 69 y.o. female with a known history of  multiple medical problems including recent admission from 06/10/2015 to 07/05/2015 at that time she had developed acute respiratory failure due to healthcare associated pneumonia associated with acute renal failure. Patient was seen by palliative care during that hospitalization and made into a DO NOT RESUSCITATE. She was discharged to a skilled nursing facility. Patient presents from the facility due to severe epigastric pain. Associated with nausea and vomiting. Patient in the emergency room was noted to have abdomen with significant amount of free air. The ER physician spoke to the surgeon on call as well as the hospitalist. Patient is a very high surgical risk and complication due to her COPD, chronic respiratory failure CHF. Surgery is waiting to talk to her daughter before deciding whether to take her to the operating room. Patient also complains of some shortness of breath denies any fevers or chills no chest pains or palpitations. She has also had intermittent diarrhea. PAST MEDICAL HISTORY:   Past Medical History  Diagnosis Date  . COPD (chronic obstructive pulmonary disease) ([place])   . Hypertension   . Ischemic cardiomyopathy     a.   . Coronary artery disease     a. s/p multiple stenting in 2005  . History of ventricular tachycardia   . PVD (peripheral vascular disease) ([place])   . PAD (peripheral artery disease) ([place])   . HLD (hyperlipidemia)   . Chronic back pain   . MI, old   . MRSA (methicillin resistant staph aureus) culture positive      left foot wound  . CHF (congestive heart failure) ([place])   . Pneumonia   . Asthma   . Cancer [facility])     PAST SURGICAL HISTORY:  Past Surgical History  Procedure Laterality Date  . Insert / replace / remove pacemaker    . Vascular bypass surgery Bilateral   . Cardiac catheterization N/A 01/14/2015    Procedure: Left Heart Cath;  Surgeon: Wellington Hampshire, MD;  Location: [facility];  Service: Cardiovascular;  Laterality: N/A;  . Cardiac defibrillator placement    . Appendectomy    . Cesarean section    . Abdominal surgery      SOCIAL HISTORY:  Social History  Substance Use Topics  . Smoking status: Current Every Day Smoker -- 0.50 packs/day    Types: Cigarettes  . Smokeless tobacco: Never Used  . Alcohol Use: No    FAMILY HISTORY:  Family History  Problem Relation Age of Onset  . CAD Other   . Breast cancer Mother   . Heart disease Mother   . Coronary artery disease Father     DRUG ALLERGIES:  Allergies  Allergen Reactions  . Heparin Other (See Comments)    Unknown reaction   . Nsaids Nausea And Vomiting and Other (See Comments)    Reaction:  GI distress and burning     REVIEW OF SYSTEMS:   CONSTITUTIONAL: No fever, positive fatigue and weakness.  EYES: No blurred or double vision.  EARS, NOSE, AND THROAT:  No tinnitus or ear pain.  RESPIRATORY: No cough, chronic shortness of breath, no wheezing or hemoptysis.  CARDIOVASCULAR: No chest pain, orthopnea, edema.  GASTROINTESTINAL: Positive nausea, positive vomiting, positive diarrhea or severe abdominal pain.  GENITOURINARY: No dysuria, hematuria.  ENDOCRINE: No polyuria, nocturia,  HEMATOLOGY: No anemia, easy bruising or bleeding SKIN: Has a lesion on her forehead that is chronic and likely large skin cancer, also ulceration on her foot MUSCULOSKELETAL: No joint pain or arthritis.   NEUROLOGIC: No tingling, numbness, weakness.  PSYCHIATRY: No anxiety or depression.   MEDICATIONS AT HOME:  Prior  to Admission medications   Medication Sig Start Date End Date Taking? Authorizing Provider  acetaminophen (TYLENOL) 325 MG tablet Take 2 tablets (650 mg total) by mouth every 6 (six) hours as needed for mild pain (or Fever >/= 101). 07/05/15   Dustin Flock, MD  albuterol (PROVENTIL HFA;VENTOLIN HFA) 108 (90 BASE) MCG/ACT inhaler Inhale 2 puffs into the lungs every 4 (four) hours as needed for wheezing or shortness of breath.    Historical Provider, MD  aspirin 81 MG chewable tablet Chew 1 tablet (81 mg total) by mouth daily. 07/05/15   Dustin Flock, MD  atorvastatin (LIPITOR) 40 MG tablet Take 1 tablet (40 mg total) by mouth daily at 6 PM. 01/17/15   Gladstone Lighter, MD  benzonatate (TESSALON) 200 MG capsule Take 1 capsule (200 mg total) by mouth 3 (three) times daily as needed for cough. 02/11/15   Vaughan Basta, MD  budesonide (PULMICORT) 0.25 MG/2ML nebulizer solution Take 2 mLs (0.25 mg total) by nebulization 2 (two) times daily. 07/05/15   Dustin Flock, MD  carvedilol (COREG) 3.125 MG tablet Take 1 tablet (3.125 mg total) by mouth 2 (two) times daily with a meal. 01/17/15   Gladstone Lighter, MD  collagenase (SANTYL) ointment Apply 1 application topically daily.    Historical Provider, MD  diazepam (VALIUM) 10 MG tablet Take 1 tablet (10 mg total) by mouth every 12 (twelve) hours as needed for anxiety. 07/05/15   Dustin Flock, MD  diltiazem (CARDIZEM) 30 MG tablet Take 1 tablet (30 mg total) by mouth every 6 (six) hours. 07/05/15   Dustin Flock, MD  diphenoxylate-atropine (LOMOTIL) 2.5-0.025 MG tablet Take 2 tablets by mouth 4 (four) times daily as needed for diarrhea or loose stools. 07/05/15   Dustin Flock, MD  feeding supplement, ENSURE ENLIVE, (ENSURE ENLIVE) LIQD Take 237 mLs by mouth 3 (three) times daily with meals. 02/11/15   Vaughan Basta, MD  furosemide (LASIX) 40 MG tablet Take 1 tablet (40 mg total) by mouth daily. 07/05/15   Dustin Flock, MD  guaiFENesin  (MUCINEX) 600 MG 12 hr tablet Take 1 tablet (600 mg total) by mouth 2 (two) times daily. 02/11/15   Vaughan Basta, MD  hydrocortisone (CORTEF) 20 MG tablet Take 1 tablet (20 mg total) by mouth daily. 07/05/15   Dustin Flock, MD  HYDROmorphone (DILAUDID) 4 MG tablet Take 1 tablet (4 mg total) by mouth every 6 (six) hours as needed for severe pain. 07/05/15   Dustin Flock, MD  ipratropium-albuterol (DUONEB) 0.5-2.5 (3) MG/3ML SOLN Take 3 mLs by nebulization every 6 (six) hours as needed. Patient taking differently: Take 3 mLs by nebulization every 6 (six) hours as needed (for wheezing/shortness of breath).  02/11/15   Vaughan Basta, MD  isosorbide mononitrate (IMDUR) 30 MG 24 hr tablet Take 1 tablet (30 mg total) by mouth daily. 02/11/15   Vaughan Basta, MD  mometasone-formoterol (DULERA) 100-5 MCG/ACT AERO Inhale 2  puffs into the lungs 2 (two) times daily. 04/11/15   Henreitta Leber, MD  oxyCODONE (OXYCONTIN) 80 mg 12 hr tablet Take 1 tablet (80 mg total) by mouth every 12 (twelve) hours. 07/05/15   Dustin Flock, MD  pantoprazole (PROTONIX) 40 MG tablet Take 1 tablet (40 mg total) by mouth 2 (two) times daily. 07/05/15   Dustin Flock, MD  promethazine (PHENERGAN) 25 MG tablet Take 25 mg by mouth daily as needed for nausea or vomiting.    Historical Provider, MD  tiotropium (SPIRIVA) 18 MCG inhalation capsule Place 1 capsule (18 mcg total) into inhaler and inhale daily. 04/11/15   Henreitta Leber, MD  zolpidem (AMBIEN) 10 MG tablet Take 5 mg by mouth at bedtime as needed for sleep.    Historical Provider, MD      PHYSICAL EXAMINATION:   VITAL SIGNS: Blood pressure 85/47, pulse 108, temperature 100.7 F (38.2 C), temperature source Core (Comment), resp. rate 32, weight 51.529 kg (113 lb 9.6 oz), SpO2 96 %.  GENERAL:  69 y.o.-year-old patient lying in the bed critically ill-appearing EYES: Pupils equal, round, reactive to light and accommodation. No scleral icterus.  Extraocular muscles intact.  HEENT: Head atraumatic, normocephalic. Oropharynx and nasopharynx clear.  NECK:  Supple, no jugular venous distention. No thyroid enlargement, no tenderness.  LUNGS: Normal breath sounds bilaterally, no wheezing, rales,rhonchi or crepitation. No use of accessory muscles of respiration.  CARDIOVASCULAR: S1, S2 normal. No murmurs, rubs, or gallops.  ABDOMEN: Distended, diffuse tenderness, positive guarding, no hepatosplenomegaly EXTREMITIES: No pedal edema, cyanosis, or clubbing.  NEUROLOGIC: Cranial nerves II through XII are intact. Muscle strength 5/5 in all extremities. Sensation intact. Gait not checked.  PSYCHIATRIC: The patient is alert and oriented x 3.  SKIN: Has a lesion on her 400 as well as ulcer on her foot  LABORATORY PANEL:   CBC  Recent Labs Lab 07/07/2015 0759  WBC 20.4*  HGB 11.8*  HCT 37.6  PLT 223  MCV 80.8  MCH 25.3*  MCHC 31.3*  RDW 27.6*  LYMPHSABS 3.6  MONOABS 0.9  EOSABS 0.0  BASOSABS 0.1   ------------------------------------------------------------------------------------------------------------------  Chemistries   Recent Labs Lab 07/03/2015 0759  NA 141  K 3.0*  CL 113*  CO2 19*  GLUCOSE 91  BUN 32*  CREATININE 1.94*  CALCIUM 7.2*  AST 22  ALT 11*  ALKPHOS 69  BILITOT 0.8   ------------------------------------------------------------------------------------------------------------------ estimated creatinine clearance is 22 mL/min (by C-G formula based on Cr of 1.94). ------------------------------------------------------------------------------------------------------------------ No results for input(s): TSH, T4TOTAL, T3FREE, THYROIDAB in the last 72 hours.  Invalid input(s): FREET3   Coagulation profile No results for input(s): INR, PROTIME in the last 168 hours. ------------------------------------------------------------------------------------------------------------------- No results for input(s):  DDIMER in the last 72 hours. -------------------------------------------------------------------------------------------------------------------  Cardiac Enzymes No results for input(s): CKMB, TROPONINI, MYOGLOBIN in the last 168 hours.  Invalid input(s): CK ------------------------------------------------------------------------------------------------------------------ Invalid input(s): POCBNP  ---------------------------------------------------------------------------------------------------------------  Urinalysis    Component Value Date/Time   COLORURINE YELLOW* 07/19/2015 0759   COLORURINE Amber 11/16/2014 0015   APPEARANCEUR HAZY* 07/16/2015 0759   APPEARANCEUR Hazy 11/16/2014 0015   LABSPEC 1.016 07/27/2015 0759   LABSPEC 1.033 11/16/2014 0015   PHURINE 5.0 07/24/2015 0759   PHURINE 6.0 11/16/2014 0015   GLUCOSEU NEGATIVE 07/01/2015 0759   GLUCOSEU Negative 11/16/2014 0015   HGBUR NEGATIVE 07/11/2015 0759   HGBUR Negative 11/16/2014 0015   BILIRUBINUR NEGATIVE 07/09/2015 0759   BILIRUBINUR Negative 11/16/2014 0015   KETONESUR NEGATIVE 07/17/2015 0759   KETONESUR Negative  11/16/2014 0015   PROTEINUR 30* 07/13/2015 0759   PROTEINUR 100 mg/dL 11/16/2014 0015   UROBILINOGEN 1.0 07/21/2014 1951   NITRITE NEGATIVE 07/16/2015 0759   NITRITE Negative 11/16/2014 0015   LEUKOCYTESUR 2+* 07/13/2015 0759   LEUKOCYTESUR Trace 11/16/2014 0015     RADIOLOGY: Ct Abdomen Pelvis Wo Contrast  07/10/2015  CLINICAL DATA:  Abdominal pain and distension EXAM: CT ABDOMEN AND PELVIS WITHOUT CONTRAST TECHNIQUE: Multidetector CT imaging of the abdomen and pelvis was performed following the standard protocol without IV contrast. COMPARISON:  06/30/2015, 08/03/2014 FINDINGS: Lung lung bases demonstrate mild scarring in the right lower lobe as well as small pulmonary nodules measuring less than 4 mm. These are stable from previous exam dating back to January 2016. The liver, spleen, pancreas,  adrenal glands and kidneys are stable in appearance from the prior exam. Diffuse renal vascular calcifications are seen. Small stones are also noted bilaterally. No obstructive changes are seen. A considerable amount of free air and free fluid is noted within the abdominal cavity. The sigmoid is focally dilated with considerable bowel material within. The loop of dilated sigmoid is constricted centrally although no true volvulus is seen. This may represent internal hernia. This dilated loop is likely the etiology of the focal perforation. The colon proximal to this is within normal limits. There are some mildly dilated loops of small bowel identified also related to this area of focal constriction. The bladder is decompressed. 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 No acute bony abnormality is noted. IMPRESSION: Considerable free air and free fluid within the abdomen likely related to perforation of a focally dilated loop of sigmoid colon. There are changes suggestive of an internal hernia best seen on image number 46 of series 2. This also contributes to a degree of mild small bowel obstruction. Clinical correlation is recommended. Stable pulmonary nodules. Chronic changes similar to that seen on the prior exam. Critical Value/emergent results were called by telephone at the time of interpretation on 07/30/2015 at 9:49 am to Dr. Francene Castle , who verbally acknowledged these results. Electronically Signed   By: Inez Catalina M.D.   On: 07/15/2015 09:51   Dg Chest 1 View  07/03/2015  CLINICAL DATA:  Difficulty breathing EXAM: CHEST  1 VIEW COMPARISON:  06/28/2015 FINDINGS: Cardiac shadow remains mildly enlarged. A defibrillator is again seen and stable. Lungs are well aerated bilaterally. Mild interstitial changes are seen without focal infiltrate or sizable effusion. IMPRESSION: No acute abnormality noted. Electronically Signed   By: Inez Catalina M.D.   On: 07/12/2015  08:32    EKG: Orders placed or performed during the hospital encounter of 07/08/2015  . ED EKG  . ED EKG  . EKG 12-Lead  . EKG 12-Lead    IMPRESSION AND PLAN: Patient is a 69 year old white female with multiple medical problems presents with perforation in the abdomen.  1. Abdominal pain due to perforated viscus: Has been seen by surgery. They plan to discuss the risk and benefit with her daughter. Patient has high mortality rate associated with her surgery. At this point from a medical standpoint I will keep her nothing by mouth. Provider with empiric antibiotics. To cover gastric flora. Further treatment of the perforated viscus per surgery. Patient very high risk of surgical complications.  2. Chronic respiratory failure due to COPD, I will place her on nebulizers scheduled.  3. Coronary artery disease hold oral medications due to #1  4. Chronic combined systolic and diastolic  CHF currently appears to be compensated chest x-ray is negative  5. Hypotension related to #1 and possible sepsis low-dose IV fluids and empiric antibiotics with Zosyn and follow blood cultures  6. Acute renal failure on chronic renal failure IV fluids follow renal function  7. Adrenal insufficiency: During recent hospitalization she was seen by endocrinology and there was concern for adrenal insufficiency on oral steroids at home. I will give her dexamethasone for time being. Until able to take by mouth.  8. CODE STATUS again addressed with the patient she was DO NOT RESUSCITATE on discharge she confirms she does not want to be resuscitated or intubated.      All the records are reviewed and case discussed with ED provider. Management plans discussed with the patient, family and they are in agreement.  CODE STATUS: DO NOT RESUSCITATE    TOTAL TIME TAKING CARE OF THIS PATIENT: 55 minutes. Critical care time   Dustin Flock M.D on 07/10/2015 at 10:37 AM  Between 7am to 6pm - Pager -  [phone]  After 6pm go to [email or web address] - password [email or web address] [facility]  [facility] [place]  Office  [phone]  CC: Primary care physician; Juanell Fairly, MD

## 2015-07-17 NOTE — H&P (Signed)
Patient ID: Lynn Butler, female   DOB: April 17, 1946, 69 y.o.   MRN: [id number]  CC: Shortness of Breath  HPI Lynn Butler is a 69 y.o. female presents emergency department from a skilled nursing facility with shortness of breath. Patient also complained of abdominal pain. She was recently discharged from this hospital after acute respiratory failure due to pneumonia which was complicated by renal failure as well as her COPD and her CHF. Patient's only current complaint is of her abdominal pain. She denies any fevers, chills, constipation. She has had nausea and vomiting as well as diarrhea.  HPI  Past Medical History  Diagnosis Date  . COPD (chronic obstructive pulmonary disease) ([place])   . Hypertension   . Ischemic cardiomyopathy     a.   . Coronary artery disease     a. s/p multiple stenting in 2005  . History of ventricular tachycardia   . PVD (peripheral vascular disease) ([place])   . PAD (peripheral artery disease) ([place])   . HLD (hyperlipidemia)   . Chronic back pain   . MI, old   . MRSA (methicillin resistant staph aureus) culture positive     left foot wound  . CHF (congestive heart failure) ([place])   . Pneumonia   . Asthma   . Cancer [facility])     Past Surgical History  Procedure Laterality Date  . Insert / replace / remove pacemaker    . Vascular bypass surgery Bilateral   . Cardiac catheterization N/A 01/14/2015    Procedure: Left Heart Cath;  Surgeon: Wellington Hampshire, MD;  Location: [facility];  Service: Cardiovascular;  Laterality: N/A;  . Cardiac defibrillator placement    . Appendectomy    . Cesarean section    . Abdominal surgery      Family History  Problem Relation Age of Onset  . CAD Other   . Breast cancer Mother   . Heart disease Mother   . Coronary artery disease Father     Social History Social History  Substance Use Topics  . Smoking status: Current Every Day Smoker -- 0.50 packs/day    Types: Cigarettes  . Smokeless tobacco: Never  Used  . Alcohol Use: No    Allergies  Allergen Reactions  . Heparin Other (See Comments)    Unknown reaction   . Nsaids Nausea And Vomiting and Other (See Comments)    Reaction:  GI distress and burning     Current Facility-Administered Medications  Medication Dose Route Frequency Provider Last Rate Last Dose  . budesonide (PULMICORT) nebulizer solution 0.25 mg  0.25 mg Nebulization BID Dustin Flock, MD      . dexamethasone (DECADRON) injection 4 mg  4 mg Intravenous 4 times per day Dustin Flock, MD      . fentaNYL (SUBLIMAZE) injection 50 mcg  50 mcg Intravenous Q1H PRN Ahmed Prima, MD   50 mcg at 07/22/2015 [id number]  . ipratropium-albuterol (DUONEB) 0.5-2.5 (3) MG/3ML nebulizer solution 3 mL  3 mL Nebulization Q4H Shreyang Patel, MD      . piperacillin-tazobactam (ZOSYN) IVPB 3.375 g  3.375 g Intravenous 3 times per day Dustin Flock, MD      . tiotropium (SPIRIVA) inhalation capsule 18 mcg  18 mcg Inhalation Daily Dustin Flock, MD       Current Outpatient Prescriptions  Medication Sig Dispense Refill  . acetaminophen (TYLENOL) 325 MG tablet Take 2 tablets (650 mg total) by mouth every 6 (six) hours as needed for mild pain (or  Fever >/= 101). 1 tablet 0  . albuterol (PROVENTIL HFA;VENTOLIN HFA) 108 (90 BASE) MCG/ACT inhaler Inhale 2 puffs into the lungs every 4 (four) hours as needed for wheezing or shortness of breath.    Marland Kitchen aspirin 81 MG chewable tablet Chew 1 tablet (81 mg total) by mouth daily.    Marland Kitchen atorvastatin (LIPITOR) 40 MG tablet Take 1 tablet (40 mg total) by mouth daily at 6 PM. 30 tablet 0  . benzonatate (TESSALON) 100 MG capsule Take 200 mg by mouth 3 (three) times daily as needed for cough.    . carvedilol (COREG) 3.125 MG tablet Take 1 tablet (3.125 mg total) by mouth 2 (two) times daily with a meal. 60 tablet 2  . diazepam (VALIUM) 10 MG tablet Take 1 tablet (10 mg total) by mouth every 12 (twelve) hours as needed for anxiety. 30 tablet 0  . diltiazem (CARDIZEM)  30 MG tablet Take 1 tablet (30 mg total) by mouth every 6 (six) hours. 1 tablet 0  . diphenoxylate-atropine (LOMOTIL) 2.5-0.025 MG tablet Take 2 tablets by mouth 4 (four) times daily as needed for diarrhea or loose stools. 30 tablet 0  . guaiFENesin (MUCINEX) 600 MG 12 hr tablet Take 1 tablet (600 mg total) by mouth 2 (two) times daily. 20 tablet 0  . hydrocortisone (CORTEF) 20 MG tablet Take 1 tablet (20 mg total) by mouth daily. 1 tablet 0  . HYDROmorphone (DILAUDID) 4 MG tablet Take 1 tablet (4 mg total) by mouth every 6 (six) hours as needed for severe pain. 15 tablet 0  . ipratropium-albuterol (DUONEB) 0.5-2.5 (3) MG/3ML SOLN Take 3 mLs by nebulization every 6 (six) hours as needed. (Patient taking differently: Take 3 mLs by nebulization every 6 (six) hours as needed (for wheezing/shortness of breath). ) 360 mL 0  . isosorbide mononitrate (IMDUR) 30 MG 24 hr tablet Take 1 tablet (30 mg total) by mouth daily. 30 tablet 0  . mometasone-formoterol (DULERA) 100-5 MCG/ACT AERO Inhale 2 puffs into the lungs 2 (two) times daily.    Marland Kitchen oxyCODONE (OXYCONTIN) 80 mg 12 hr tablet Take 1 tablet (80 mg total) by mouth every 12 (twelve) hours. 14 tablet 0  . pantoprazole (PROTONIX) 40 MG tablet Take 1 tablet (40 mg total) by mouth 2 (two) times daily. 1 tablet 0  . potassium chloride SA (K-DUR,KLOR-CON) 20 MEQ tablet Take 20 mEq by mouth daily.    . promethazine (PHENERGAN) 25 MG tablet Take 25 mg by mouth daily as needed for nausea or vomiting.    . tiotropium (SPIRIVA) 18 MCG inhalation capsule Place 1 capsule (18 mcg total) into inhaler and inhale daily. 30 capsule 12  . zolpidem (AMBIEN) 10 MG tablet Take 5 mg by mouth at bedtime as needed for sleep.    . benzonatate (TESSALON) 200 MG capsule Take 1 capsule (200 mg total) by mouth 3 (three) times daily as needed for cough. 20 capsule 0  . budesonide (PULMICORT) 0.25 MG/2ML nebulizer solution Take 2 mLs (0.25 mg total) by nebulization 2 (two) times daily. 60  mL 12  . feeding supplement, ENSURE ENLIVE, (ENSURE ENLIVE) LIQD Take 237 mLs by mouth 3 (three) times daily with meals. 237 mL 12  . furosemide (LASIX) 40 MG tablet Take 1 tablet (40 mg total) by mouth daily. 30 tablet 0     Review of Systems A multi-point review of systems was asked and was negative except for the findings documented in the history of present illness  Physical Exam  Blood pressure 90/48, pulse 105, temperature 99.5 F (37.5 C), temperature source Core (Comment), resp. rate 27, weight 51.529 kg (113 lb 9.6 oz), SpO2 94 %. CONSTITUTIONAL: Resting in bed in obvious discomfort. EYES: Pupils are equal, round, and reactive to light, Sclera are non-icteric. EARS, NOSE, MOUTH AND THROAT: The oropharynx is clear. The oral mucosa is pink and moist. Hearing is intact to voice. LYMPH NODES:  Lymph nodes in the neck are normal. RESPIRATORY:  Lungs are coarse throughout. There is normal respiratory effort, with equal breath sounds bilaterally, and without pathologic use of accessory muscles. CARDIOVASCULAR: Heart is tachycardic . GI: The abdomen is soft, visibly distended and tender throughout with obvious peritoneal signs. There are no palpable masses. There is no hepatosplenomegaly. There are normal bowel sounds in all quadrants. GU: Rectal deferred.   MUSCULOSKELETAL: Normal muscle strength and tone. No cyanosis or edema.   SKIN: Turgor is good and there is a visible skin ulcer on her left lateral ankle without any spreading erythema or purulent drainage. NEUROLOGIC: Motor and sensation is grossly normal. Cranial nerves are grossly intact. PSYCH:  Oriented to person, place but not time. Affect is depressed.  Data Reviewed Images and labs reviewed. Images concerning for hypokalemia elevated creatinine elevated BUN and hypoalbuminemia. Calcium was 3, creatinine was 1.94, BUN is 32, albumin was 1.7, lactic acid is 3.4. Leukocytosis with a white count of 20.4. CT scan reviewed which  shows massive free air. There is numerous staple lines and multiple surgical changes within the abdomen significant for her previous operations for ischemic colitis. I have personally reviewed the patient's imaging, laboratory findings and medical records.    Assessment    Perforated viscus    Plan    69 year old critically ill female with a perforated viscus. Per her history and her exam is difficult to ascertain the location of perforation. Also due to her history of COPD, CHF, recent renal failure and recent critical illness in addition to her prolonged illness I was counseled her and her family that she is extremely high operative risk. Had the discussion that this is the time to choose between an operation with a likely prolonged stay and will likely be fatal versus comfort care which will most certainly be fatal. Discussed at length and in detail that the operation may not be curative and that she may not survive the operating itself. If she does survive she will then require a prolonged intubation and a critical care stay for which she may require vasopressor support as well as dialysis. All these things were vocalized to be understood by the patient and her daughter and yet they still wish to proceed. Plan for an exploratory laparotomy as soon as possible. Appreciate internal medicine assistance and will likely need critical care assistance as well.     Time spent with the patient was 120 minutes, with more than 50% of the time spent in face-to-face education, counseling and care coordination.     Clayburn Pert, MD FACS General Surgeon 07/13/2015, 11:24 AM

## 2015-07-17 NOTE — ED Notes (Signed)
[facility] from [facility]. Ems called for AMS and difficulty breathing. Pt alert and oriented at baseline.

## 2015-07-17 NOTE — Progress Notes (Signed)
RT increased RR to 18 per verbal order from Dr. Stevenson Clinch due to ABG results

## 2015-07-17 NOTE — ED Notes (Signed)
Pt will be going to surgery - consent filled out for signature

## 2015-07-18 ENCOUNTER — Encounter: Payer: Self-pay | Admitting: General Surgery

## 2015-07-18 LAB — BLOOD GAS, ARTERIAL
Acid-base deficit: 17.8 mmol/L — ABNORMAL HIGH (ref 0.0–2.0)
BICARBONATE: 9.9 meq/L — AB (ref 21.0–28.0)
FIO2: 0.4
LHR: 35 {breaths}/min
MECHANICAL RATE: 35
MECHVT: 410 mL
O2 Saturation: 98.5 %
PEEP: 5 cmH2O
PO2 ART: 146 mmHg — AB (ref 83.0–108.0)
Patient temperature: 37
pCO2 arterial: 29 mmHg — ABNORMAL LOW (ref 32.0–48.0)
pH, Arterial: 7.14 — CL (ref 7.350–7.450)

## 2015-07-18 LAB — BLOOD CULTURE ID PANEL (REFLEXED)
Acinetobacter baumannii: NOT DETECTED
CANDIDA GLABRATA: NOT DETECTED
CANDIDA KRUSEI: NOT DETECTED
CANDIDA PARAPSILOSIS: NOT DETECTED
CARBAPENEM RESISTANCE: NOT DETECTED
Candida albicans: NOT DETECTED
Candida tropicalis: NOT DETECTED
ENTEROBACTER CLOACAE COMPLEX: NOT DETECTED
ESCHERICHIA COLI: DETECTED — AB
Enterobacteriaceae species: NOT DETECTED
Enterococcus species: NOT DETECTED
Haemophilus influenzae: NOT DETECTED
KLEBSIELLA OXYTOCA: NOT DETECTED
KLEBSIELLA PNEUMONIAE: NOT DETECTED
Listeria monocytogenes: NOT DETECTED
Methicillin resistance: NOT DETECTED
Neisseria meningitidis: NOT DETECTED
PROTEUS SPECIES: NOT DETECTED
PSEUDOMONAS AERUGINOSA: NOT DETECTED
STAPHYLOCOCCUS AUREUS BCID: NOT DETECTED
STREPTOCOCCUS PNEUMONIAE: NOT DETECTED
STREPTOCOCCUS PYOGENES: NOT DETECTED
Serratia marcescens: NOT DETECTED
Staphylococcus species: NOT DETECTED
Streptococcus agalactiae: NOT DETECTED
Streptococcus species: NOT DETECTED
Vancomycin resistance: NOT DETECTED

## 2015-07-18 LAB — CBC
HEMATOCRIT: 34.5 % — AB (ref 35.0–47.0)
HEMOGLOBIN: 10 g/dL — AB (ref 12.0–16.0)
MCH: 24.4 pg — ABNORMAL LOW (ref 26.0–34.0)
MCHC: 28.9 g/dL — AB (ref 32.0–36.0)
MCV: 84.3 fL (ref 80.0–100.0)
Platelets: 174 10*3/uL (ref 150–440)
RBC: 4.09 MIL/uL (ref 3.80–5.20)
RDW: 29.1 % — ABNORMAL HIGH (ref 11.5–14.5)
WBC: 38.8 10*3/uL — AB (ref 3.6–11.0)

## 2015-07-18 LAB — LACTIC ACID, PLASMA: LACTIC ACID, VENOUS: 7.6 mmol/L — AB (ref 0.5–2.0)

## 2015-07-18 MED ORDER — SODIUM BICARBONATE 8.4 % IV SOLN: 100.0000 meq | Freq: Once | INTRAVENOUS | Status: DC

## 2015-07-18 MED ORDER — DOBUTAMINE IN D5W 4-5 MG/ML-% IV SOLN
2.5000 ug/kg/min | INTRAVENOUS | Status: DC
  Filled 2015-07-18: qty 250

## 2015-07-18 MED ORDER — SODIUM CHLORIDE 0.9 % IV SOLN
500.0000 mg | Freq: Two times a day (BID) | INTRAVENOUS | Status: DC
  Administered 2015-07-18: 500 mg via INTRAVENOUS
  Filled 2015-07-18 (×2): qty 0.5

## 2015-07-20 LAB — BLOOD CULTURE ID PANEL (REFLEXED)
Acinetobacter baumannii: NOT DETECTED
CANDIDA GLABRATA: NOT DETECTED
CANDIDA KRUSEI: NOT DETECTED
CANDIDA TROPICALIS: NOT DETECTED
CARBAPENEM RESISTANCE: NOT DETECTED
Candida albicans: NOT DETECTED
Candida parapsilosis: NOT DETECTED
ENTEROBACTER CLOACAE COMPLEX: NOT DETECTED
Enterobacteriaceae species: NOT DETECTED
Enterococcus species: NOT DETECTED
Escherichia coli: NOT DETECTED
Haemophilus influenzae: NOT DETECTED
KLEBSIELLA PNEUMONIAE: NOT DETECTED
Klebsiella oxytoca: NOT DETECTED
Listeria monocytogenes: NOT DETECTED
METHICILLIN RESISTANCE: NOT DETECTED
NEISSERIA MENINGITIDIS: NOT DETECTED
PROTEUS SPECIES: NOT DETECTED
PSEUDOMONAS AERUGINOSA: NOT DETECTED
SERRATIA MARCESCENS: NOT DETECTED
STREPTOCOCCUS PNEUMONIAE: NOT DETECTED
STREPTOCOCCUS PYOGENES: NOT DETECTED
Staphylococcus aureus (BCID): NOT DETECTED
Staphylococcus species: NOT DETECTED
Streptococcus agalactiae: NOT DETECTED
Streptococcus species: NOT DETECTED
Vancomycin resistance: NOT DETECTED

## 2015-07-20 LAB — SURGICAL PATHOLOGY

## 2015-07-21 LAB — CULTURE, BLOOD (ROUTINE X 2)

## 2015-07-24 LAB — URINE CULTURE

## 2015-07-24 LAB — CULTURE, BLOOD (ROUTINE X 2)

## 2015-07-31 NOTE — Progress Notes (Signed)
ANTIBIOTIC CONSULT NOTE - INITIAL  Pharmacy Consult for piperacillin/tazobactam Indication: Intra-abdominal infection  Allergies  Allergen Reactions  . Heparin Other (See Comments)    Unknown reaction   . Nsaids Nausea And Vomiting and Other (See Comments)    Reaction:  GI distress and burning     Patient Measurements: Height: 5\' 3"  (160 cm) Weight: 112 lb 7 oz (51 kg) IBW/kg (Calculated) : 52.4  Vital Signs: Temp: 98.4 F (36.9 C) (12/19 0015) BP: 103/66 mmHg (12/19 0015) Pulse Rate: 104 (12/18 2200) Intake/Output from previous day: 12/18 0701 - 12/19 0700 In: 4000 [I.V.:4000] Out: 960 [Urine:350; Drains:560; Blood:50] Intake/Output from this shift: Total I/O In: -  Out: 240 [Drains:240]  Labs:  Recent Labs  07/30/2015 0759 07/05/2015 1829  WBC 20.4*  --   HGB 11.8*  --   PLT 223  --   CREATININE 1.94* 1.73*   Estimated Creatinine Clearance: 25.1 mL/min (by C-G formula based on Cr of 1.73). No results for input(s): VANCOTROUGH, VANCOPEAK, VANCORANDOM, GENTTROUGH, GENTPEAK, GENTRANDOM, TOBRATROUGH, TOBRAPEAK, TOBRARND, AMIKACINPEAK, AMIKACINTROU, AMIKACIN in the last 72 hours.   Microbiology: Recent Results (from the past 720 hour(s))  C difficile quick scan w PCR reflex     Status: None   Collection Time: 06/20/15  4:30 AM  Result Value Ref Range Status   C Diff antigen NEGATIVE NEGATIVE Final   C Diff toxin NEGATIVE NEGATIVE Final   C Diff interpretation Negative for C. difficile  Final  C difficile quick scan w PCR reflex     Status: None   Collection Time: 06/29/15 10:27 AM  Result Value Ref Range Status   C Diff antigen NEGATIVE NEGATIVE Final   C Diff toxin NEGATIVE NEGATIVE Final   C Diff interpretation VALID  Final  Culture, blood (routine x 2)     Status: None (Preliminary result)   Collection Time: 07/25/2015  7:58 AM  Result Value Ref Range Status   Specimen Description BLOOD LEFT HAND  Final   Special Requests BOTTLES DRAWN AEROBIC AND ANAEROBIC   1CC  Final   Culture  Setup Time   Final    GRAM NEGATIVE RODS ANAEROBIC BOTTLE ONLY CONFIRMED BY PAULA MARY Organism ID to follow CRITICAL RESULT CALLED TO, READ BACK BY AND VERIFIED WITH: MATT Lliam Hoh AT 2353 ON 07/14/2015 [facility]    Culture GRAM NEGATIVE RODS ANAEROBIC BOTTLE ONLY   Final   Report Status PENDING  Incomplete  Blood Culture ID Panel (Reflexed)     Status: Abnormal   Collection Time: 07/23/2015  7:58 AM  Result Value Ref Range Status   Enterococcus species NOT DETECTED NOT DETECTED Final   Listeria monocytogenes NOT DETECTED NOT DETECTED Final   Staphylococcus species NOT DETECTED NOT DETECTED Final   Staphylococcus aureus NOT DETECTED NOT DETECTED Final   Streptococcus species NOT DETECTED NOT DETECTED Final   Streptococcus agalactiae NOT DETECTED NOT DETECTED Final   Streptococcus pneumoniae NOT DETECTED NOT DETECTED Final   Streptococcus pyogenes NOT DETECTED NOT DETECTED Final   Acinetobacter baumannii NOT DETECTED NOT DETECTED Final   Enterobacteriaceae species NOT DETECTED NOT DETECTED Final   Enterobacter cloacae complex NOT DETECTED NOT DETECTED Final   Escherichia coli DETECTED (A) NOT DETECTED Final    Comment: CRITICAL CALLED AND READ BACK FROM MATT Alanee Ting AT 2353 ON 07/28/2015 R[facility]   Klebsiella oxytoca NOT DETECTED NOT DETECTED Final   Klebsiella pneumoniae NOT DETECTED NOT DETECTED Final   Proteus species NOT DETECTED NOT DETECTED Final   Serratia  marcescens NOT DETECTED NOT DETECTED Final   Haemophilus influenzae NOT DETECTED NOT DETECTED Final   Neisseria meningitidis NOT DETECTED NOT DETECTED Final   Pseudomonas aeruginosa NOT DETECTED NOT DETECTED Final   Candida albicans NOT DETECTED NOT DETECTED Final   Candida glabrata NOT DETECTED NOT DETECTED Final   Candida krusei NOT DETECTED NOT DETECTED Final   Candida parapsilosis NOT DETECTED NOT DETECTED Final   Candida tropicalis NOT DETECTED NOT DETECTED Final   Carbapenem resistance NOT DETECTED NOT  DETECTED Final   Methicillin resistance NOT DETECTED NOT DETECTED Final   Vancomycin resistance NOT DETECTED NOT DETECTED Final  Culture, blood (routine x 2)     Status: None (Preliminary result)   Collection Time: 07/12/2015  7:59 AM  Result Value Ref Range Status   Specimen Description BLOOD RIGHT THUMB  Final   Special Requests BOTTLES DRAWN AEROBIC AND ANAEROBIC  1CC  Final   Culture  Setup Time   Final    GRAM NEGATIVE RODS ANAEROBIC BOTTLE ONLY CONFIRMED BY PAULA MARY CRITICAL RESULT CALLED TO, READ BACK BY AND VERIFIED WITH: MATT Raahil Ong AT 2353 ON 07/06/2015 R[facility]    Culture GRAM NEGATIVE RODS ANAEROBIC BOTTLE ONLY   Final   Report Status PENDING  Incomplete  MRSA PCR Screening     Status: Abnormal   Collection Time: 07/05/2015  5:26 PM  Result Value Ref Range Status   MRSA by PCR POSITIVE (A) NEGATIVE Final    Comment:        The GeneXpert MRSA Assay (FDA approved for NASAL specimens only), is one component of a comprehensive MRSA colonization surveillance program. It is not intended to diagnose MRSA infection nor to guide or monitor treatment for MRSA infections. CRITICAL RESULT CALLED TO, READ BACK BY AND VERIFIED WITH: ADAM SCARBORO @ 1917 07/17/15 BY T[facility]     Assessment: Pharmacy consulted to dose piperacillin/tazobactam in this 70 year old female admitted today and underwent emergent surgery requiring total colectomy. Patient prescribed piperacillin/tazobactam for an intra-abdominal infection.  CrCl ~ 25 mL/min  Patient started on piperacillin/tazobactam 3.375 g IV q8h EI in the ED.  Plan:  Continue piperacillin/tazobactam 3.375 g IV q 8 hours EI per renal function and indication  12/19 1230 [employer] resulted E.coli from blood. Zosyn changed to meropenem renally adjusted per conversation with hospitalist.  Kennie Karapetian S 2015/07/31,12:34 AM

## 2015-07-31 NOTE — Progress Notes (Signed)
[facility] Progress Note Patient Name: Lynn Butler DOB: 03-Nov-1945 MRN: [id number]   Date of Service  23-Jul-2015  HPI/Events of Note  ABG on 40%/PRVC 35/TV  /P5 = 7.14/29/146. Lactic Acid remains = 7.6. LVEF = 15% - 25%.   eICU Interventions  Will order: 1. NaHCO3 100 meq IV now.  2. Dobutamine IV infusion at 2.5 mcg/kg/min. Try to increase to 5.0 mcg/kg/min if HR and BP will tolerate.  3. Check Coox at 2:30 AM.     Intervention Category Major Interventions: Acid-Base disturbance - evaluation and management;Shock - evaluation and management;Hypotension - evaluation and management  Johnthomas Lader Eugene Jul 23, 2015, 1:29 AM

## 2015-07-31 NOTE — Anesthesia Postprocedure Evaluation (Signed)
Anesthesia Post Note  Patient: Lynn Butler  Procedure(s) Performed: Procedure(s) (LRB): EXPLORATORY LAPAROTOMY (N/A)  Patient location during evaluation: ICU Anesthesia Type: General Level of consciousness: patient remains intubated per anesthesia plan Vital Signs Assessment: vitals unstable Respiratory status: patient remains intubated per anesthesia plan Cardiovascular status: unstable Anesthetic complications: no Comments: Pt. Expired in ICU    Last Vitals:  Filed Vitals:   Jul 29, 2015 0145 July 29, 2015 0215  BP: 55/33   Pulse: 115   Temp: 36.7 C 36.6 C  Resp: 31 0    Last Pain: There were no vitals filed for this visit.               [employer]

## 2015-07-31 NOTE — Discharge Summary (Signed)
  Date of Admission: 07/30/2015  7:31 AM  Date of death:   Admitting diagnosis:   1.Acute abdominal pain 2. Shortness of breath 3. Sepsis  Diagnosis at time of death* 1. Left-sided ischemic colitis with perforated colon 2. Acute on chronic respiratory failure 3. Sepsis due to #1 4. Coronary artery disease with recent MI 5. Severe systolic dysfunction with chronic systolic CHF 6. COPD   Hospital course patient was a 70 year old white female with multiple medical problems who was recently hospitalized in November and discharged in early part of December to a skilled nursing facility. She presented to the emergency room with complaint of abdominal pain and had a CT scan of the abdomen which showed a perforated bowel. She was seen by surgery. Due to her being high risk case was discussed with the patient and her daughter they understood that she was at very high risk of complications and mortality. She went to the operating room. After having her surgery she was taken to the ICU. Within a few hours of being in the ICU patient continued decline. And that she was DO NOT RESUSCITATE and therefore was not resuscitated. Patient passed away family was notified.          TOTAL TIME TAKING CARE OF THIS PATIENT: 35 minutes.    Dustin Flock M.D on 07/19/2015 at 4:12 PM  Between 7am to 6pm - Pager - [phone]  After 6pm go to [email or web address] - password [email or web address] [facility]  [facility]  Office  [phone]  CC: Primary care physician; Juanell Fairly, MD

## 2015-07-31 NOTE — Progress Notes (Signed)
   08/13/2015 0400  Clinical Encounter Type  Visited With Patient and family together  Visit Type Death  Referral From Nurse  Consult/Referral To Chaplain  Spiritual Encounters  Spiritual Needs Prayer;Grief support  Responded to Death page from nurse.  Daughter was enroute.  Provided pastoral presence, support and prayer to daughter.  Daughter and her husband will return later an would like chaplain to return.  Told them to have chaplain paged if they would like me to return, will also convey to unit chaplain later this morning for follow up.  [place] [phone]

## 2015-07-31 NOTE — Progress Notes (Signed)
Spoke with [employer].   Lynn Butler Pt is released. CDS# [phone]

## 2015-07-31 NOTE — Progress Notes (Signed)
[place] Progress Note Patient Name: Lynn Butler DOB: 12/09/1945 MRN: [id number]   Date of Service  Jul 19, 2015  HPI/Events of Note  ABG on 40%/PRVC 22/TV 410/P 5 = 7.11/45/116/14.3  eICU Interventions  Will order: 1. Increase PRVC rate to 35. 2. Repeat ABG at 1 AM.  3. Monitor CVP. 4. CBC now to check Hgb.     Intervention Category Major Interventions: Respiratory failure - evaluation and management;Acid-Base disturbance - evaluation and management  Karrie Fluellen Eugene 07-19-2015, 12:01 AM

## 2015-07-31 NOTE — Progress Notes (Signed)
Pts ileostomy bag leaked in bed, Charge Nurse Danae Chen and this RN at bedside to turn and clean pt.  During turn, pt became bradycardic, in the 40's.  She momentarily rebounded to the 90's with laying her flat.  She became hypotensive according to her Arterial Line and her Levophed was increased, and later maxed out at 54mcg/min.  She had persistent bradycardia that became PEA.  No carotid or Femoral pulses were Dopplerable.  [place] in to check on patient.  Pt's code status was DNR, and all supportive measures were exhausted.  Dr. Lavetta Nielsen came from ED to Pronounce patient.

## 2015-07-31 DEATH — deceased

## 2015-09-11 DIAGNOSIS — K559 Vascular disorder of intestine, unspecified: Secondary | ICD-10-CM | POA: Insufficient documentation

## 2017-08-11 IMAGING — CT CT HEAD W/O CM
4 series · 18 of 30 positions shown, 19 images · non-contrast
Comparison: 10/19/2010

CLINICAL DATA: Per EMS report, patient lives at home with family,
has recent diagnosis of pneumonia, and has been less alert, unable
to smoke for the last 2-3 days. EMS report they found the patient
without oxygen sitting on a couch, which was urine-soaked, had feces
and mucous on it. EMS report states O2 was available in the house.
placed on NRB and arrived with O2 sats at 99%. Patient was
unresponsive upon arrival, leaning over to the right. Staff was
unable to straighten patient to mid-line.

EXAM:
CT HEAD WITHOUT CONTRAST
TECHNIQUE: Contiguous axial images were obtained from the base of the skull
through the vertex without intravenous contrast.

[Series 2: head bone · axial · 0.43mm/px · z∈[+264,+408]mm · 8 of 90 slices shown]
[im 9/90  bone]
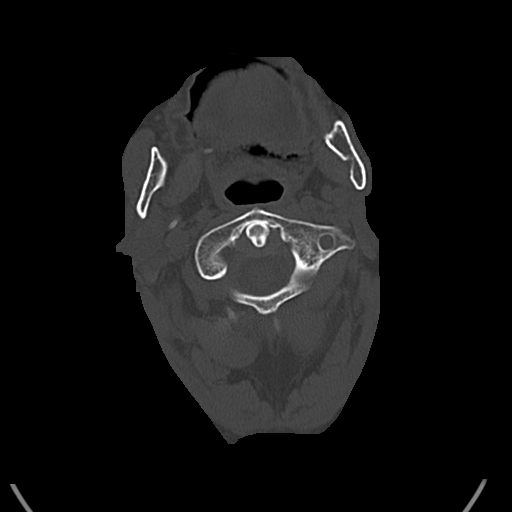
[im 17/90  bone]
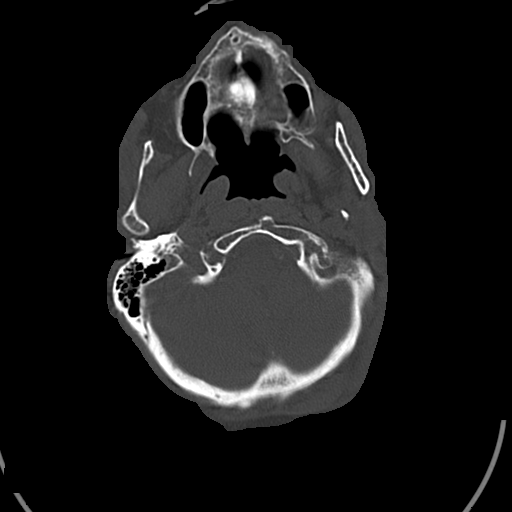
[im 33/90  bone]
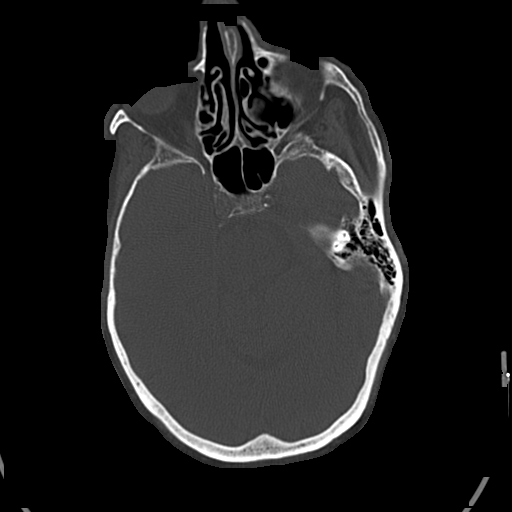
[im 41/90  bone]
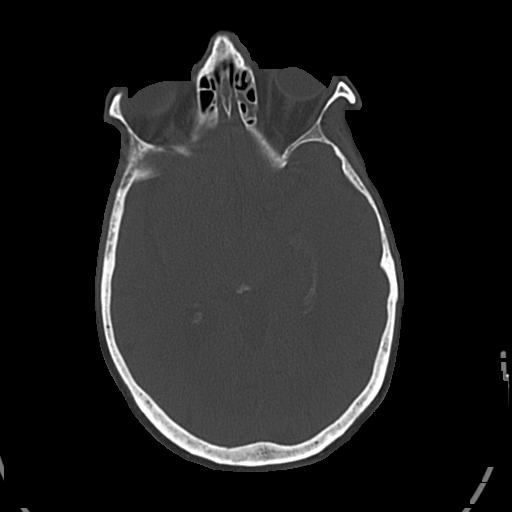
[im 49/90  bone]
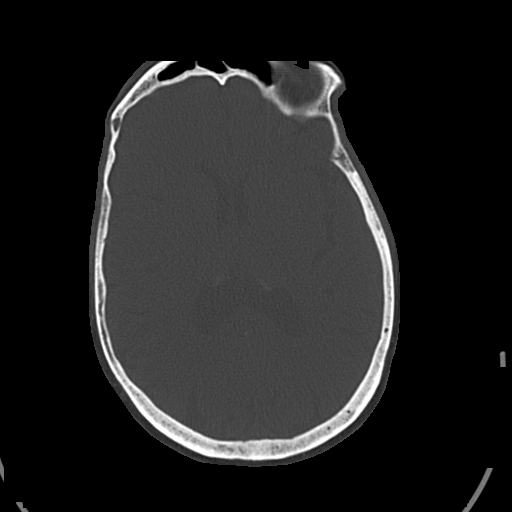
[im 57/90  bone]
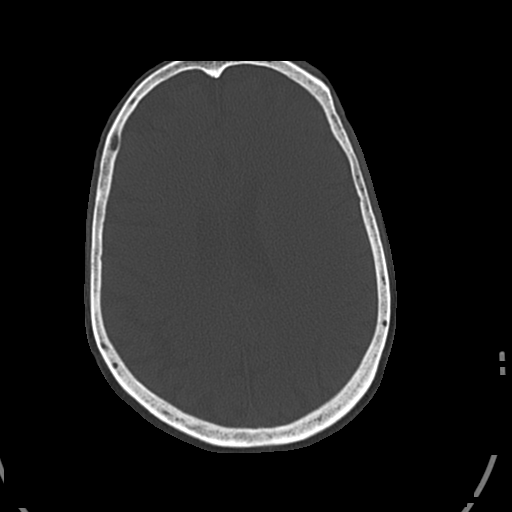
[im 73/90  bone]
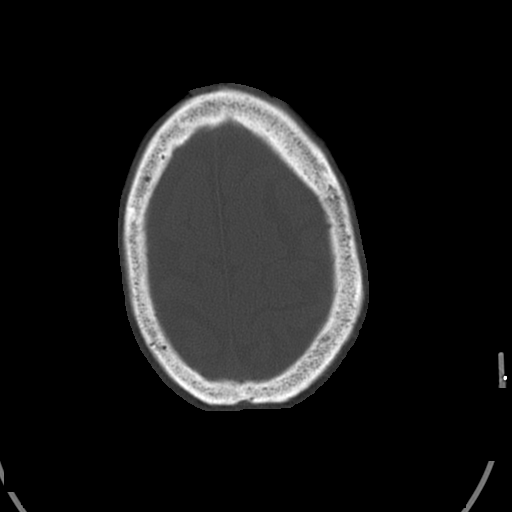
[im 81/90  bone]
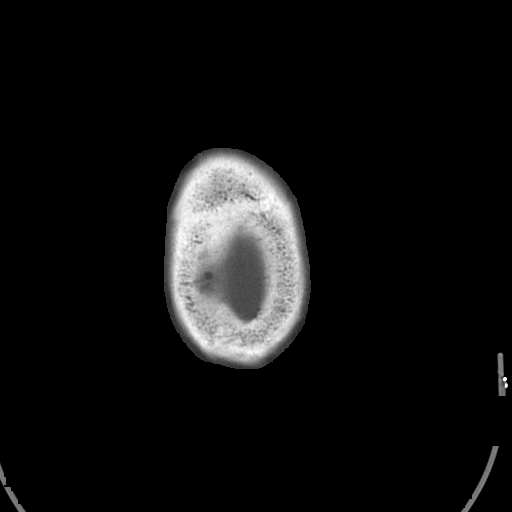

[Series 3: head wo · axial · 0.43mm/px · z∈[+308,+362]mm · 2 of 33 slices shown, 3 images]
[im 11/33  brain]
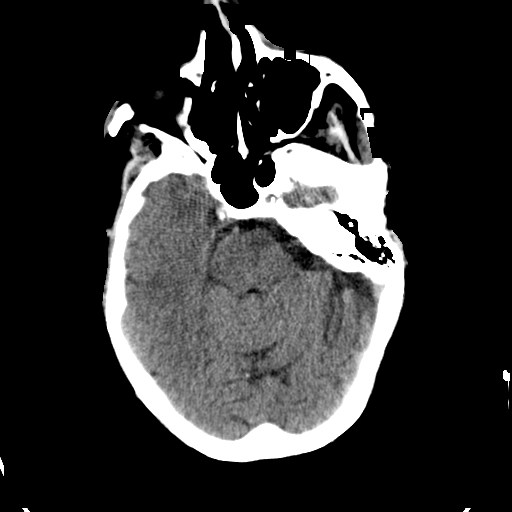
[im 11/33  bone]
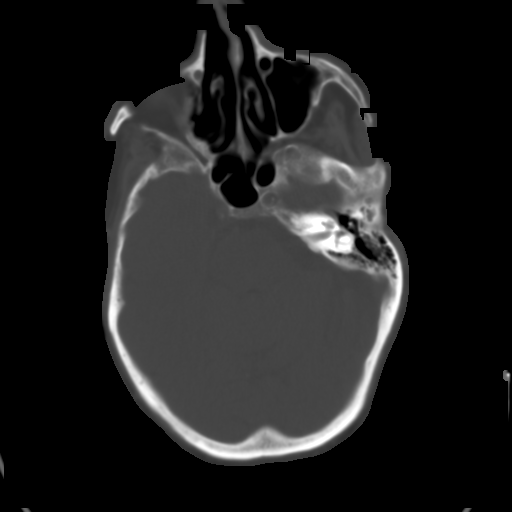
[im 22/33  brain]
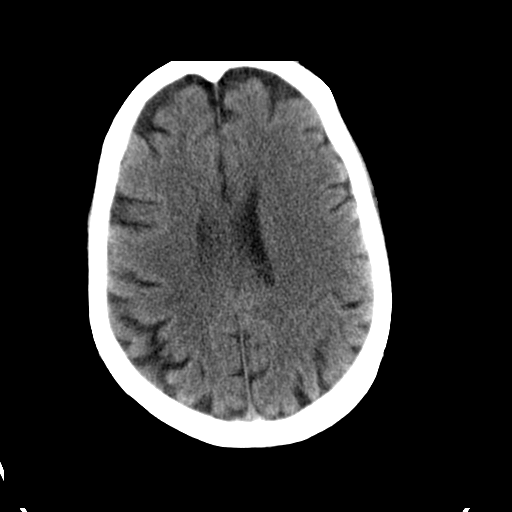

[Series 4: head wo recon · axial · 0.43mm/px · z∈[+368,+408]mm · 2 of 27 slices shown]
[im 9/27  brain]
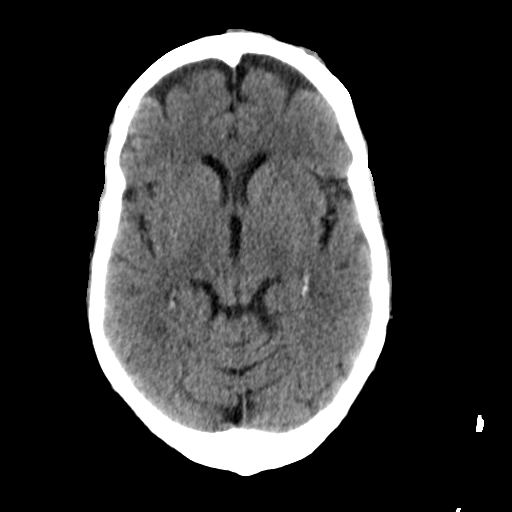
[im 18/27  brain]
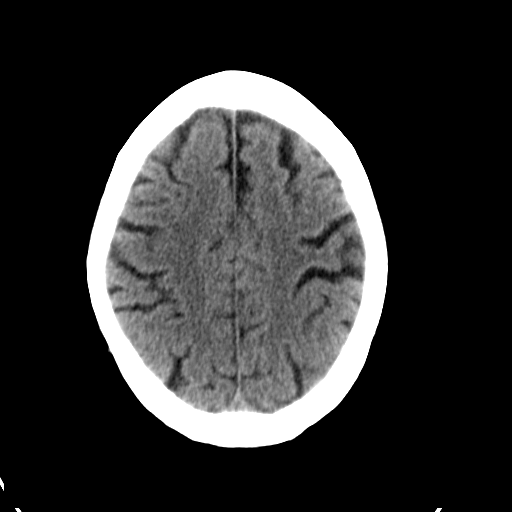

[Series 5: head bone recon · axial · 0.43mm/px · z∈[+341,+419]mm · 6 of 78 slices shown]
[im 9/78  bone]
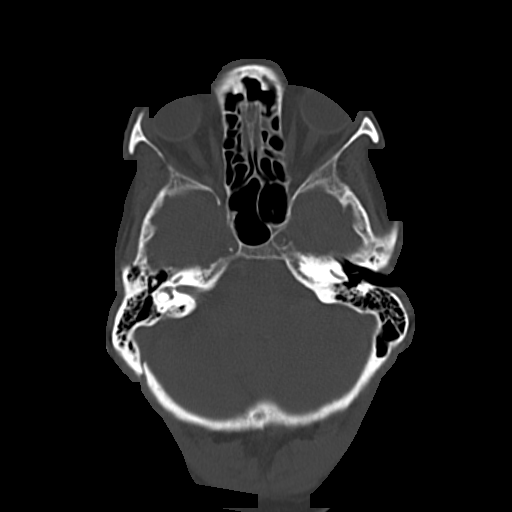
[im 18/78  bone]
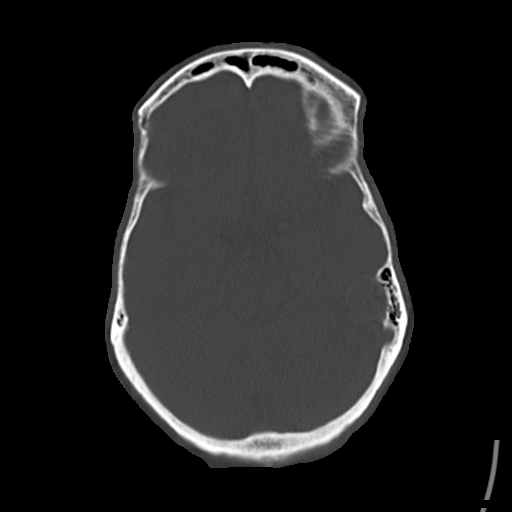
[im 26/78  bone]
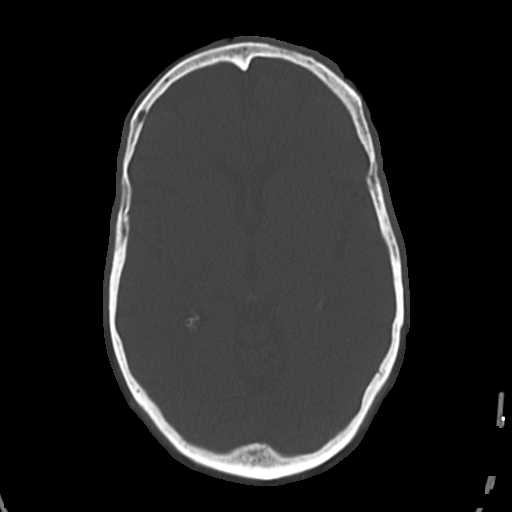
[im 35/78  bone]
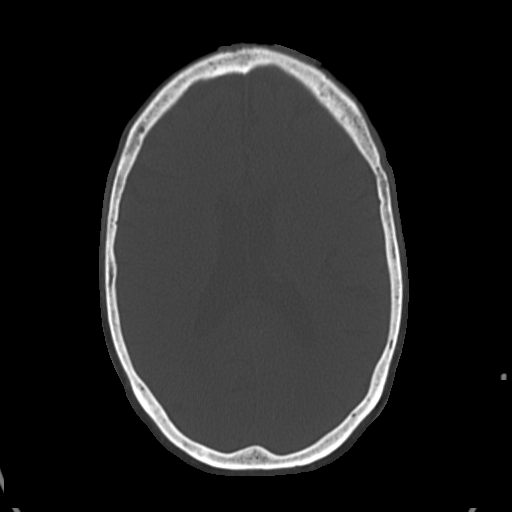
[im 43/78  bone]
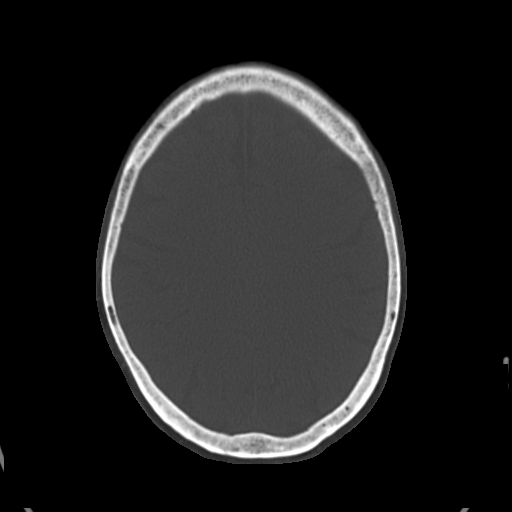
[im 52/78  bone]
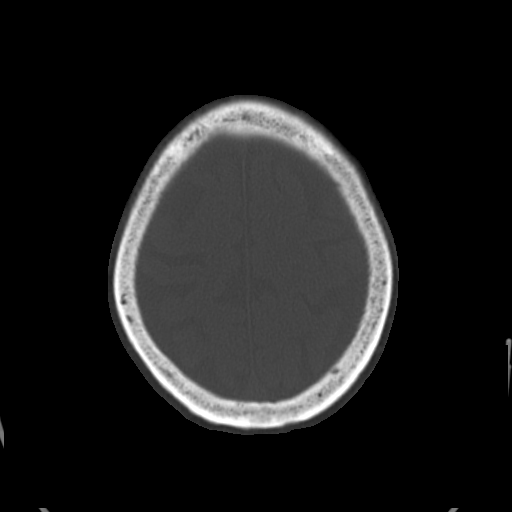

[18 of 30 positions shown; findings below may reference images not displayed]

FINDINGS: There is mild central and cortical atrophy There is no intra or
extra-axial fluid collection or mass lesion. The basilar cisterns
and ventricles have a normal appearance. There is no CT evidence for
acute infarction or hemorrhage. There is atherosclerotic
calcification of the internal carotid arteries. Bone windows are
otherwise unremarkable. There is irregularity of the frontal scalp
region raising the question of laceration. No underlying fracture.
IMPRESSION: 1. Mild atrophy.
2.  No evidence for acute intracranial abnormality.
3. Question of laceration of the forehead.
